# Patient Record
Sex: Male | Born: 1955
Health system: Southern US, Community
[De-identification: ages and names within clinical notes are randomized; demographics above are authoritative.]

## PROBLEM LIST (undated history)

## (undated) ENCOUNTER — Ambulatory Visit: Attending: Pharmacist | Primary: Pharmacist

## (undated) ENCOUNTER — Encounter

## (undated) ENCOUNTER — Ambulatory Visit

## (undated) ENCOUNTER — Telehealth

## (undated) ENCOUNTER — Encounter: Attending: Neurology | Primary: Neurology

## (undated) ENCOUNTER — Ambulatory Visit: Payer: MEDICARE | Attending: Family | Primary: Family

## (undated) ENCOUNTER — Telehealth
Attending: Pharmacist Clinician (PhC)/ Clinical Pharmacy Specialist | Primary: Pharmacist Clinician (PhC)/ Clinical Pharmacy Specialist

## (undated) ENCOUNTER — Ambulatory Visit
Attending: Pharmacist Clinician (PhC)/ Clinical Pharmacy Specialist | Primary: Pharmacist Clinician (PhC)/ Clinical Pharmacy Specialist

## (undated) ENCOUNTER — Encounter: Attending: Pharmacist | Primary: Pharmacist

## (undated) ENCOUNTER — Encounter
Attending: Pharmacist Clinician (PhC)/ Clinical Pharmacy Specialist | Primary: Pharmacist Clinician (PhC)/ Clinical Pharmacy Specialist

## (undated) ENCOUNTER — Ambulatory Visit: Payer: MEDICARE | Attending: Neurology | Primary: Neurology

## (undated) ENCOUNTER — Ambulatory Visit: Payer: BLUE CROSS/BLUE SHIELD

## (undated) ENCOUNTER — Telehealth: Attending: Neurology | Primary: Neurology

## (undated) DIAGNOSIS — R0600 Dyspnea, unspecified: Secondary | ICD-10-CM

## (undated) DIAGNOSIS — E039 Hypothyroidism, unspecified: Secondary | ICD-10-CM

## (undated) DIAGNOSIS — U071 COVID-19: Secondary | ICD-10-CM

## (undated) DIAGNOSIS — E7800 Pure hypercholesterolemia, unspecified: Secondary | ICD-10-CM

## (undated) DIAGNOSIS — Z9889 Other specified postprocedural states: Secondary | ICD-10-CM

## (undated) DIAGNOSIS — J189 Pneumonia, unspecified organism: Secondary | ICD-10-CM

## (undated) DIAGNOSIS — K759 Inflammatory liver disease, unspecified: Secondary | ICD-10-CM

## (undated) DIAGNOSIS — C801 Malignant (primary) neoplasm, unspecified: Secondary | ICD-10-CM

## (undated) DIAGNOSIS — N19 Unspecified kidney failure: Secondary | ICD-10-CM

## (undated) DIAGNOSIS — E119 Type 2 diabetes mellitus without complications: Secondary | ICD-10-CM

## (undated) DIAGNOSIS — G4730 Sleep apnea, unspecified: Secondary | ICD-10-CM

## (undated) DIAGNOSIS — G255 Other chorea: Secondary | ICD-10-CM

## (undated) DIAGNOSIS — R112 Nausea with vomiting, unspecified: Secondary | ICD-10-CM

## (undated) DIAGNOSIS — I10 Essential (primary) hypertension: Secondary | ICD-10-CM

## (undated) DIAGNOSIS — D649 Anemia, unspecified: Secondary | ICD-10-CM

## (undated) DIAGNOSIS — G709 Myoneural disorder, unspecified: Secondary | ICD-10-CM

## (undated) HISTORY — DX: Unspecified kidney failure: N19

## (undated) HISTORY — PX: CATARACT EXTRACTION: SUR2

## (undated) MED ORDER — SODIUM ZIRCONIUM CYCLOSILICATE 10 GRAM ORAL POWDER PACKET: 0 days

## (undated) MED ORDER — RISPERIDONE 0.25 MG TABLET: Freq: Two times a day (BID) | ORAL | 0 days

---
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
## 2008-04-23 HISTORY — PX: KIDNEY TRANSPLANT: SHX239

## 2014-01-21 HISTORY — PX: COLONOSCOPY: SHX174

## 2015-07-26 ENCOUNTER — Ambulatory Visit (HOSPITAL_COMMUNITY): Payer: BLUE CROSS/BLUE SHIELD | Attending: Internal Medicine | Admitting: Physical Therapy

## 2015-07-26 DIAGNOSIS — M79675 Pain in left toe(s): Secondary | ICD-10-CM | POA: Diagnosis present

## 2015-07-26 DIAGNOSIS — L89894 Pressure ulcer of other site, stage 4: Secondary | ICD-10-CM | POA: Diagnosis present

## 2015-07-26 DIAGNOSIS — R262 Difficulty in walking, not elsewhere classified: Secondary | ICD-10-CM | POA: Diagnosis present

## 2015-07-26 DIAGNOSIS — E08621 Diabetes mellitus due to underlying condition with foot ulcer: Secondary | ICD-10-CM | POA: Insufficient documentation

## 2015-07-26 DIAGNOSIS — L97503 Non-pressure chronic ulcer of other part of unspecified foot with necrosis of muscle: Secondary | ICD-10-CM | POA: Diagnosis present

## 2015-07-27 ENCOUNTER — Encounter (HOSPITAL_COMMUNITY): Payer: Self-pay

## 2015-07-27 ENCOUNTER — Ambulatory Visit (HOSPITAL_COMMUNITY): Payer: BLUE CROSS/BLUE SHIELD

## 2015-07-27 DIAGNOSIS — E08621 Diabetes mellitus due to underlying condition with foot ulcer: Secondary | ICD-10-CM | POA: Diagnosis not present

## 2015-07-27 DIAGNOSIS — M79675 Pain in left toe(s): Secondary | ICD-10-CM

## 2015-07-27 DIAGNOSIS — L97503 Non-pressure chronic ulcer of other part of unspecified foot with necrosis of muscle: Secondary | ICD-10-CM

## 2015-07-27 DIAGNOSIS — R262 Difficulty in walking, not elsewhere classified: Secondary | ICD-10-CM

## 2015-07-27 NOTE — Therapy (Addendum)
[place], [place], [address] Phone: [phone]   Fax:  [phone]  Wound Care Therapy  Patient Details  Name: Nathan Tyler MRN: [id number] Date of Birth: 07/24/1955 No Data Recorded  Encounter Date: 07/27/2015      PT End of Session - 07/27/15 1615    Visit Number 2   Number of Visits 16   Date for PT Re-Evaluation 08/25/15   Authorization Type BCBS   Authorization Time Period Certification dates [id number]   PT Start Time 1525   PT Stop Time 1600   PT Time Calculation (min) 35 min   Activity Tolerance Patient tolerated treatment well   Behavior During Therapy [facility] for tasks assessed/performed      History reviewed. No pertinent past medical history.  History reviewed. No pertinent past surgical history.  There were no vitals filed for this visit.  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  Pt stated he went to MD earlier today and was unable to have xray taken, missing referral.     Currently in Pain? No/denies                   Wound Therapy - 07/27/15 1605    Subjective Pt entered dept with dressings intact, no reports of pain today.  Pt stated he went to MD earlier today and was unable to have xray taken, missing referral.     Patient and Family Stated Goals for this wound to heal   Date of Onset 07/20/15   Prior Treatments self care   Pain Assessment No/denies pain   Pain Score 0-No pain   Pain Type Acute pain   Pain Location Toe (Comment which one)  2nd toe Lt foot   Pain Orientation Left   Pain Onset On-going   Patients Stated Pain Goal 0   Pain Intervention(s) Emotional support   Multiple  Pain Sites No   Evaluation and Treatment Procedures Explained to Patient/Family Yes   Evaluation and Treatment Procedures agreed to   Wound Properties Date First Assessed: 07/26/15 Time First Assessed: 1016 Wound Type: Diabetic ulcer Location: Toe (Comment  which one) Location Orientation: Left Wound Description (Comments): 2nd toe apex   Dressing Type Gauze (Comment)  silverhydrofiber, 2x2 and 2" kling    Dressing Changed Changed   Dressing Status Old drainage   Dressing Change Frequency Every 3 days   Site / Wound Assessment Dusky;Pink   % Wound base Red or Granulating 0%   % Wound base Yellow 100%   % Wound base Black 0%   Peri-wound Assessment Edema;Erythema (blanchable)   Drainage Amount Minimal   Drainage Description Purulent;Odor   Treatment Cleansed;Debridement (Selective);Hydrotherapy (Pulse lavage)   Wound Properties Date First Assessed: 07/26/15 Wound Type: Diabetic ulcer Location: Toe (Comment  which one) Location Orientation: Left Wound Description (Comments): plantar aspect  Present on Admission: Yes   Dressing Type Gauze (Comment)  silverhydrofiber, 2x2 and 2" kling    Dressing Changed Changed   Dressing Status Old drainage   Dressing Change Frequency Every 3 days   Site / Wound Assessment Pink   % Wound base Red or Granulating 75%   % Wound base Yellow  25%   Drainage Amount Minimal   Drainage Description Purulent   Treatment Cleansed;Debridement (Selective);Hydrotherapy (Pulse lavage)   Selective Debridement - Location wound bed   Selective Debridement - Tools Used Forceps;Scalpel   Selective Debridement - Tissue Removed slough, dead skin perimeter   Wound Therapy - Clinical Statement Began PLS for deep cleansing of diabetic ulcer on dorsal and plantar surface of 2nd toe.  Selective debridement for removal of slough and dry skin perimeter of wound to promote healing.  Continued with silverhydrofiber for drainage and to reduce purulent odor.  No reports of pain through  session.   Wound Therapy - Functional Problem List difficulty walking, uncomfortable to place a sock or a shoe on his foot.    Factors Delaying/Impairing Wound Healing Altered sensation;Diabetes Mellitus;Infection - systemic/local;Multiple medical problems;Polypharmacy   Hydrotherapy Plan Debridement;Dressing change;Patient/family education;Pulsatile lavage with suction   Wound Therapy - Frequency --  2x/week x 8 weeks   Wound Therapy - Current Recommendations PT   Wound Plan Pt to be seen for debridement, education and dressing.  Pt may benefit from pulse lavage for deep cleansing    Dressing  silverhydrofiber, 2x2 and 2" kling                    PT Short Term Goals - 07/27/15 0841    PT SHORT TERM GOAL #1   Title Pt Lt toe pain to be decreased to no more than a 2/10 to be able to tolerate having a sock on his foot   Time 4   Period Weeks   Status New   PT SHORT TERM GOAL #2   Title Pt wound to be 100% granulated to prevent local and systemic infection    Time 4   Period Weeks   Status New   PT SHORT TERM GOAL #3   Title Pt to be able to tolerate walking for 30 minutes at a time.   Time 4   Period Weeks   Status New   PT SHORT TERM GOAL #4   Title Pt to verbalize the importance of daily foot inspection to prevent diabetic ulcers   Time 2   Period Weeks   Status New           PT Long Term Goals - 07/27/15 [id number]    PT LONG TERM GOAL #1   Title Pt to have no pain in his left second toe to be able to tolerate wearing shoes and socks all day long.    Time 8   Period Weeks   Status New   PT LONG TERM GOAL #2   Title Pt wound to be 100% healed for reduced risk of celluliltis or other infections    Time 8   Period Weeks   Status New   PT LONG TERM GOAL #3   Title Pt to be able to walk for up to 2 hours without any increased pain in his left second toe    Baseline 8   Period Weeks   Status New                Problem List There are no active  problems to display for this patient.  [address], LPTA; [place]  Aldona Lento 07/27/2015, 4:48 PM  [place] [address] [place], [place], [address] Phone: [phone]   Fax:  [phone]  Name: Nathan Tyler MRN: [id number] Date of Birth: May 17, 1955

## 2015-07-27 NOTE — Addendum Note (Signed)
Addended by: Leeroy Cha on: 07/27/2015 04:49 PM   Modules accepted: Orders

## 2015-07-27 NOTE — Therapy (Addendum)
[place], [place], [address] Phone: [phone]   Fax:  [phone]  Wound Care Evaluation  Patient Details  Name: Nathan Tyler MRN: [id number] Date of Birth: 03/01/1956 No Data Recorded  Encounter Date: 07/26/2015      PT End of Session - 07/26/15 1615    Visit Number 1   Number of Visits 16   Date for PT Re-Evaluation 08/25/15   Authorization Type BCBS   Authorization Time Period Certification dates [id number]   PT Start Time 1015   PT Stop Time 1100   PT Time Calculation (min) 45 min   Activity Tolerance Patient tolerated treatment well   Behavior During Therapy [facility] for tasks assessed/performed      No past medical history on file.  No past surgical history on file.  There were no vitals filed for this visit.  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  He has tried self care and antibiotics but the wound has continued to progress therefore his MD has referred him to therapy.   Patient and Family Stated Goals for this wound to heal   Date of Onset 07/20/15   Prior Treatments self care   Pain Assessment 0-10   Pain Score 8    Pain Type Acute pain;Neuropathic pain   Pain Location Toe (Comment which one)  2nd toe on left   Pain Orientation Left   Pain Descriptors / Indicators Aching   Pain Onset On-going   Patients Stated Pain Goal 0   Pain Intervention(s) Emotional support   Multiple Pain Sites No   Evaluation and Treatment Procedures Explained to Patient/Family Yes   Evaluation and Treatment Procedures agreed to   Wound Properties Date First Assessed: 07/26/15 Time First Assessed: 1016 Wound Type: Diabetic ulcer Location: Toe (Comment  which one) Location Orientation: Left Wound Description (Comments): 2nd toe apex   Dressing Type Gauze (Comment)   Dressing Changed Changed   Dressing Status Old drainage   Dressing Change Frequency Every 3 days   Site / Wound Assessment Black;Dusky   % Wound base Red or Granulating 0%   % Wound base Yellow 80%   % Wound base Black 20%   Peri-wound Assessment Edema;Erythema (blanchable)  entire toe is red    Wound Length (cm) 0.4 cm   Wound Width (cm) 0.4 cm   Wound Depth (cm) 1.2 cm  at least    Undermining (cm) .8 entire periphery    Drainage Amount Minimal   Drainage Description Purulent;Odor   Treatment Cleansed;Debridement (Selective)   Wound Properties Date First Assessed: 07/26/15 Wound Type: Diabetic ulcer Location: Toe (Comment  which one) Location Orientation: Left Wound Description (Comments): plantar aspect  Present on Admission: Yes   Peri-wound Assessment Edema;Erythema (blanchable)   Wound Length (cm) 0.4 cm   Wound Width (cm) 0.4 cm   Undermining (cm) .6  at least around entire periphery.     Treatment Cleansed;Debridement (Selective)   Selective Debridement - Location wound bed   Selective Debridement -  Tools Used Forceps;Scalpel;Scissors   Selective Debridement - Tissue Removed slough    Wound Therapy - Clinical Statement Nathan Tyler has had two diabetic ullcer on the second toe on his left foot for two weeks now.  Upon examination he has two wound; one at the apex of his toe and the other on the plantar aspect.  Both wounds undermine and most likely connect below skin level.  Due to the severity of the wound and multiple medical complications Nathan Tyler will benefit from skilled physcial therapy to prevent systemic infection, improve his ability to ambulate, and decrease his pain.    Wound Therapy -  Functional Problem List difficulty walking, uncomfortable to place a sock or a shoe on his foot.    Factors Delaying/Impairing Wound Healing Altered sensation;Diabetes Mellitus;Infection - systemic/local;Multiple medical problems;Polypharmacy   Hydrotherapy Plan Debridement;Dressing change;Patient/family education;Pulsatile lavage with suction   Wound Therapy - Frequency --  twice a week for 8 weeks or until healed    Wound Therapy - Current Recommendations PT   Wound Plan Pt to be seen for debridement, education and dressing.  Pt may benefit from pulse lavage for deep cleansing    Dressing  silverhydrofiber, 2x2 and 2" kling                 PT Short Term Goals - 07/27/15 0841    PT SHORT TERM GOAL #1   Title Pt Lt toe pain to be decreased to no more than a 2/10 to be able to tolerate having a sock on his foot   Time 4   Period Weeks   Status New   PT SHORT TERM GOAL #2   Title Pt wound to be 100% granulated to prevent local and systemic infection    Time 4   Period Weeks   Status New   PT SHORT TERM GOAL #3   Title Pt to be able to tolerate walking for 30 minutes at a time.   Time 4   Period Weeks   Status New   PT SHORT TERM GOAL #4   Title Pt to verbalize the importance of daily foot inspection to prevent diabetic ulcers   Time 2   Period Weeks   Status New           PT Long Term Goals - 07/27/15 [id number]    PT LONG TERM GOAL #1   Title Pt to have no pain in his left second toe to be able to tolerate wearing shoes and socks all day long.    Time 8   Period Weeks   Status New   PT LONG TERM GOAL #2   Title Pt wound to be 100% healed for reduced risk of celluliltis or other infections    Time 8   Period Weeks   Status New   PT LONG TERM GOAL #3   Title Pt to be able to walk for up to 2 hours without any increased pain in his left second toe    Baseline 8   Period Weeks   Status New              Problem List There are no active problems to display  for this patient.   Rayetta Humphrey, PT CLT [phone] 07/27/2015, 4:41 PM  [place] [address] [place], [place], [address] Phone: [phone]   Fax:  [phone]  Name: Nathan Tyler MRN: [id number] Date of Birth: Jan 12, 1956

## 2015-08-02 ENCOUNTER — Ambulatory Visit (HOSPITAL_COMMUNITY)
Admission: RE | Admit: 2015-08-02 | Discharge: 2015-08-02 | Disposition: A | Discharge status: Home / Self Care | Payer: BLUE CROSS/BLUE SHIELD | Source: Ambulatory Visit | Attending: Internal Medicine | Admitting: Internal Medicine

## 2015-08-02 ENCOUNTER — Ambulatory Visit (HOSPITAL_COMMUNITY): Payer: BLUE CROSS/BLUE SHIELD | Admitting: Physical Therapy

## 2015-08-02 ENCOUNTER — Other Ambulatory Visit (HOSPITAL_COMMUNITY): Payer: Self-pay | Admitting: Internal Medicine

## 2015-08-02 DIAGNOSIS — R2241 Localized swelling, mass and lump, right lower limb: Secondary | ICD-10-CM | POA: Insufficient documentation

## 2015-08-02 DIAGNOSIS — L89894 Pressure ulcer of other site, stage 4: Secondary | ICD-10-CM

## 2015-08-02 DIAGNOSIS — M7989 Other specified soft tissue disorders: Secondary | ICD-10-CM

## 2015-08-02 DIAGNOSIS — E08621 Diabetes mellitus due to underlying condition with foot ulcer: Secondary | ICD-10-CM | POA: Diagnosis not present

## 2015-08-02 DIAGNOSIS — M79672 Pain in left foot: Secondary | ICD-10-CM | POA: Insufficient documentation

## 2015-08-02 DIAGNOSIS — M25472 Effusion, left ankle: Secondary | ICD-10-CM

## 2015-08-02 DIAGNOSIS — R262 Difficulty in walking, not elsewhere classified: Secondary | ICD-10-CM

## 2015-08-02 DIAGNOSIS — M79675 Pain in left toe(s): Secondary | ICD-10-CM

## 2015-08-02 NOTE — Therapy (Signed)
[place], [place], [address] Phone: [phone]   Fax:  [phone]  Wound Care Therapy  Patient Details  Name: Nathan Tyler MRN: [id number] Date of Birth: [birth year] No Data Recorded  Encounter Date: 08/02/2015      PT End of Session - 08/02/15 1620    Visit Number 3   Number of Visits 16   Date for PT Re-Evaluation 08/25/15   Authorization Type BCBS   Authorization Time Period Certification dates 05/29/35-0/48/8891   Authorization - Visit Number 2   Authorization - Number of Visits 10   PT Start Time 1030   PT Stop Time 1120   PT Time Calculation (min) 50 min   Activity Tolerance Patient tolerated treatment well   Behavior During Therapy [facility] for tasks assessed/performed      No past medical history on file.  No past surgical history on file.  There were no vitals filed for this visit.                  Wound Therapy - 08/02/15 1611    Subjective Nathan Tyler comes with his bandage intact but pulling his second toe into extension.     Patient and Family Stated Goals for this wound to heal   Date of Onset 07/20/15   Prior Treatments self care   Pain Assessment 0-10   Pain Score 1    Pain Type Acute pain   Pain Location Toe (Comment which one)  2nd   Pain Orientation Left   Pain Onset With Activity  was at a 0 this morning    Pain Intervention(s) Emotional support   Multiple Pain Sites No   Evaluation and Treatment Procedures Explained to Patient/Family Yes   Evaluation and Treatment Procedures agreed to   Wound Properties Date First Assessed: 07/26/15 Time First Assessed: 1016 Wound Type: Diabetic ulcer Location: Toe (Comment  which one) Location Orientation: Left Wound Description (Comments): 2nd toe apex   Dressing Type Gauze (Comment)  silverhydrofiber, 2x2 and 2" kling    Dressing Changed Changed   Dressing Status Old drainage   Dressing Change Frequency Every 3 days   Site /  Wound Assessment Dusky;Pink   % Wound base Red or Granulating 0%   % Wound base Yellow 100%   % Wound base Black 0%   Peri-wound Assessment Edema;Erythema (blanchable)   Wound Length (cm) 0.5 cm   Wound Width (cm) 0.6 cm   Wound Depth (cm) 1.2 cm  at least   Drainage Amount Minimal   Drainage Description Purulent;Odor   Treatment Cleansed;Debridement (Selective);Hydrotherapy (Pulse lavage)   Wound Properties Date First Assessed: 07/26/15 Wound Type: Diabetic ulcer Location: Toe (Comment  which one) Location Orientation: Left Wound Description (Comments): plantar aspect  Present on Admission: Yes   Dressing Type Gauze (Comment)  silverhydrofiber, 2x2 and 2" kling    Dressing Changed Changed   Dressing Status Old drainage   Dressing Change Frequency Every 3 days   Site / Wound Assessment Pink   % Wound base Red or Granulating 75%   % Wound base Yellow 25%   Peri-wound Assessment Edema   Wound Length (cm) 0.4 cm   Wound Width (cm) 0.4 cm   Undermining (cm) .6   most likely connects to first wound    Drainage Amount Minimal   Drainage Description Purulent   Treatment Cleansed;Debridement (Selective)   Selective Debridement - Location wound bed   Selective Debridement - Tools Used  Forceps;Scalpel   Selective Debridement - Tissue Removed slough, dead skin perimeter   Wound Therapy - Clinical Statement Nathan Tyler has not had an x-ray on his toe.  Therapist called MD requesting an x-ray.  Report returned probable osteomyelitis.  Therapist called MD re how he would like [place] to proceed or if he would want to refer pt to a surgeon.     Wound Therapy - Functional Problem List difficulty walking, uncomfortable to place a sock or a shoe on his foot.    Factors Delaying/Impairing Wound Healing Altered sensation;Diabetes Mellitus;Infection - systemic/local;Multiple medical problems;Polypharmacy   Hydrotherapy Plan Debridement;Dressing change;Patient/family education;Pulsatile lavage with suction    Wound Therapy - Frequency --  2x/week x 8 weeks   Wound Therapy - Current Recommendations PT   Wound Plan See if pt has been referred to a surgeon    Dressing  silverhydrofiber, 2x2 and 2" kling                  PT Education - 08/02/15 1619    Education provided Yes   Education Details importance of looking at dressing every day to make sure that it does not need to be changed.  Reasons it wound need to be changed:  extensive drainage, bandage unraveling, bandage pulling on appendage in an unnatural position.    Person(s) Educated Patient   Methods Explanation   Comprehension Verbalized understanding          PT Short Term Goals - 08/02/15 1622    PT SHORT TERM GOAL #1   Title Pt Lt toe pain to be decreased to no more than a 2/10 to be able to tolerate having a sock on his foot   Time 4   Period Weeks   Status Achieved   PT SHORT TERM GOAL #2   Title Pt wound to be 100% granulated to prevent local and systemic infection    Time 4   Period Weeks   Status Not Met   PT SHORT TERM GOAL #3   Title Pt to be able to tolerate walking for 30 minutes at a time.   Time 4   Period Weeks   Status Not Met   PT SHORT TERM GOAL #4   Title Pt to verbalize the importance of daily foot inspection to prevent diabetic ulcers   Time 2   Period Weeks   Status On-going           PT Long Term Goals - 08/02/15 1622    PT LONG TERM GOAL #1   Title Pt to have no pain in his left second toe to be able to tolerate wearing shoes and socks all day long.    Time 8   Period Weeks   Status On-going   PT LONG TERM GOAL #2   Title Pt wound to be 100% healed for reduced risk of celluliltis or other infections    Time 8   Period Weeks   Status Not Met   PT LONG TERM GOAL #3   Title Pt to be able to walk for up to 2 hours without any increased pain in his left second toe    Baseline 8   Period Weeks   Status On-going             Patient will benefit from skilled therapeutic  intervention in order to improve the following deficits and impairments:     Visit Diagnosis: Difficulty in walking, not elsewhere classified  Pain in left  toe(s)  Decubitus ulcer of foot, stage 4 ([place])     Problem List There are no active problems to display for this patient.   Nathan Tyler, PT CLT [phone] 08/02/2015, 4:35 PM  [place] [address] [place], [place], [address] Phone: [phone]   Fax:  [phone]  Name: Juelz Whittenberg MRN: [id number] Date of Birth: [birth year]

## 2015-08-04 ENCOUNTER — Encounter: Payer: Self-pay | Admitting: Orthopaedic Surgery

## 2015-08-04 ENCOUNTER — Other Ambulatory Visit: Payer: Self-pay

## 2015-08-04 ENCOUNTER — Ambulatory Visit: Payer: BLUE CROSS/BLUE SHIELD | Admitting: Orthopaedic Surgery

## 2015-08-04 ENCOUNTER — Ambulatory Visit (HOSPITAL_COMMUNITY): Payer: BLUE CROSS/BLUE SHIELD

## 2015-08-04 ENCOUNTER — Ambulatory Visit (HOSPITAL_COMMUNITY)
Admission: RE | Admit: 2015-08-04 | Discharge: 2015-08-04 | Disposition: A | Discharge status: Home / Self Care | Payer: BLUE CROSS/BLUE SHIELD | Source: Ambulatory Visit | Attending: Neurology | Admitting: Neurology

## 2015-08-04 ENCOUNTER — Ambulatory Visit (INDEPENDENT_AMBULATORY_CARE_PROVIDER_SITE_OTHER): Payer: BLUE CROSS/BLUE SHIELD | Admitting: Orthopaedic Surgery

## 2015-08-04 VITALS — BP 132/86 | HR 84 | Temp 97.9°F | Resp 16 | Ht 71.0 in | Wt 195.0 lb

## 2015-08-04 DIAGNOSIS — E08621 Diabetes mellitus due to underlying condition with foot ulcer: Secondary | ICD-10-CM | POA: Diagnosis not present

## 2015-08-04 DIAGNOSIS — E1051 Type 1 diabetes mellitus with diabetic peripheral angiopathy without gangrene: Secondary | ICD-10-CM | POA: Diagnosis not present

## 2015-08-04 DIAGNOSIS — M869 Osteomyelitis, unspecified: Secondary | ICD-10-CM | POA: Diagnosis not present

## 2015-08-04 DIAGNOSIS — I10 Essential (primary) hypertension: Secondary | ICD-10-CM | POA: Diagnosis not present

## 2015-08-04 DIAGNOSIS — L89894 Pressure ulcer of other site, stage 4: Secondary | ICD-10-CM

## 2015-08-04 DIAGNOSIS — M79675 Pain in left toe(s): Secondary | ICD-10-CM

## 2015-08-04 DIAGNOSIS — L97503 Non-pressure chronic ulcer of other part of unspecified foot with necrosis of muscle: Secondary | ICD-10-CM

## 2015-08-04 DIAGNOSIS — R262 Difficulty in walking, not elsewhere classified: Secondary | ICD-10-CM

## 2015-08-04 MED ORDER — DOXYCYCLINE HYCLATE 50 MG PO CAPS: ORAL_CAPSULE | ORAL | Status: DC

## 2015-08-04 NOTE — Therapy (Signed)
[name], [place], [address] Phone: [phone]   Fax:  [phone]  Wound Care Therapy  Patient Details  Name: Nathan Tyler MRN: [id number] Date of Birth: [birth year] No Data Recorded  Encounter Date: 08/04/2015      PT End of Session - 08/04/15 1722    Visit Number 4   Number of Visits 16   Date for PT Re-Evaluation 08/25/15   Authorization Type BCBS   Authorization Time Period Certification dates 07/22/9620-2/97/9892   PT Start Time 1034   PT Stop Time 1121   PT Time Calculation (min) 47 min   Activity Tolerance Patient tolerated treatment well   Behavior During Therapy [facility] for tasks assessed/performed      No past medical history on file.  No past surgical history on file.  There were no vitals filed for this visit.       Subjective Assessment - 08/04/15 1718    Subjective Pt entered dept with dressing intact, reports girlfriend changed dressings,  Apt scheduled at MD later today to discuss findings from xray.   Currently in Pain? Yes   Pain Score 3    Pain Location Toe (Comment which one)  2nd toe   Pain Orientation Left   Pain Descriptors / Indicators Aching   Pain Type Acute pain                   Wound Therapy - 08/04/15 1718    Subjective Pt entered dept with dressing intact, reports girlfriend changed dressings,  Apt scheduled at MD later today to discuss findings from xray.   Patient and Family Stated Goals for this wound to heal   Date of Onset 07/20/15   Prior Treatments self care   Pain Onset With Activity   Pain Intervention(s) Emotional support   Multiple Pain Sites No   Evaluation and Treatment Procedures Explained to Patient/Family Yes   Evaluation and Treatment Procedures agreed to   Wound Properties Date First Assessed: 07/26/15 Time First Assessed: 1016 Wound Type: Diabetic ulcer Location: Toe (Comment  which one) Location Orientation: Left Wound Description  (Comments): 2nd toe apex   Dressing Type Silver hydrofiber;Gauze (Comment)   Dressing Changed Changed   Dressing Status Old drainage   Dressing Change Frequency Every 3 days   Site / Wound Assessment Dusky;Pink   % Wound base Red or Granulating 0%   % Wound base Yellow 100%   % Wound base Black 0%   Drainage Amount Minimal   Drainage Description Purulent;Odor   Treatment Cleansed;Debridement (Selective);Hydrotherapy (Pulse lavage)   Wound Properties Date First Assessed: 07/26/15 Wound Type: Diabetic ulcer Location: Toe (Comment  which one) Location Orientation: Left Wound Description (Comments): plantar aspect  Present on Admission: Yes   Dressing Type Silver hydrofiber;Gauze (Comment)   Dressing Changed Changed   Dressing Status Old drainage   Dressing Change Frequency Every 3 days   Site / Wound Assessment Pink   % Wound base Red or Granulating 75%   % Wound base Yellow 25%   Drainage Amount Minimal   Drainage Description Purulent   Treatment Cleansed;Debridement (Selective);Hydrotherapy (Pulse lavage)   Selective Debridement - Location wound bed   Selective Debridement - Tools Used Forceps;Scalpel   Selective Debridement - Tissue Removed slough, dead skin perimeter   Wound Therapy - Clinical Statement Significant amount of dead and dry skin perimeter of wound.  Selective debridement for removal of dead skin and slough in wound.  Unknown  depth.  PLS complete for cleansing wound.  Continued with silver hydrofiber to reduce drainage.  Pt with apt with MD later today, follow up with MD for continuing wound care here vs. referral for surgeon next session.    Wound Therapy - Functional Problem List difficulty walking, uncomfortable to place a sock or a shoe on his foot.    Factors Delaying/Impairing Wound Healing Altered sensation;Diabetes Mellitus;Infection - systemic/local;Multiple medical problems;Polypharmacy   Hydrotherapy Plan Debridement;Dressing change;Patient/family  education;Pulsatile lavage with suction   Wound Therapy - Frequency --  2x/week x 8 weeks   Wound Therapy - Current Recommendations PT   Wound Plan See if pt has been referred to a surgeon    Dressing  silverhydrofiber, 2x2 and 2" kling                    PT Short Term Goals - 08/02/15 1622    PT SHORT TERM GOAL #1   Title Pt Lt toe pain to be decreased to no more than a 2/10 to be able to tolerate having a sock on his foot   Time 4   Period Weeks   Status Achieved   PT SHORT TERM GOAL #2   Title Pt wound to be 100% granulated to prevent local and systemic infection    Time 4   Period Weeks   Status Not Met   PT SHORT TERM GOAL #3   Title Pt to be able to tolerate walking for 30 minutes at a time.   Time 4   Period Weeks   Status Not Met   PT SHORT TERM GOAL #4   Title Pt to verbalize the importance of daily foot inspection to prevent diabetic ulcers   Time 2   Period Weeks   Status On-going           PT Long Term Goals - 08/02/15 1622    PT LONG TERM GOAL #1   Title Pt to have no pain in his left second toe to be able to tolerate wearing shoes and socks all day long.    Time 8   Period Weeks   Status On-going   PT LONG TERM GOAL #2   Title Pt wound to be 100% healed for reduced risk of celluliltis or other infections    Time 8   Period Weeks   Status Not Met   PT LONG TERM GOAL #3   Title Pt to be able to walk for up to 2 hours without any increased pain in his left second toe    Baseline 8   Period Weeks   Status On-going             Patient will benefit from skilled therapeutic intervention in order to improve the following deficits and impairments:     Visit Diagnosis: Difficulty in walking, not elsewhere classified  Pain in left toe(s)  Decubitus ulcer of foot, stage 4 (HCC)  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  [address], LPTA; [place]  Aldona Lento 08/04/2015, 6:44 PM  [facility] [address] [place], [place], [address] Phone: [phone]   Fax:  [phone]  Name: Gerhart Ruggieri MRN: [id number] Date of Birth: 1955-06-20

## 2015-08-04 NOTE — Patient Instructions (Signed)
Go to ER this weekend if worse

## 2015-08-05 ENCOUNTER — Encounter: Payer: Self-pay | Admitting: Orthopaedic Surgery

## 2015-08-05 NOTE — Progress Notes (Signed)
Subjective: my toe is infected    Patient ID: Nathan Tyler, male    DOB: 1956/03/29, 60 y.o.   MRN: [id number]  HPI  He has had swelling and redness of the left second toe for about a month.  He has had little pain.  He has been followed by Dr. Merlyn Albert and also physical therapy.  He has had x-rays recently which showed: IMPRESSION: Soft tissue swelling of the second toe with probable osteomyelitis of the second middle and distal phalanx  X-rays were made of his left foot 08-02-15.  After getting the report, he was referred here.  He has long term diabetes Type 1 on insulin.  His A1C was 9.6 but has risen to around 11 recently. His blood sugar was 175 this morning.  He has had more swings in his sugars with the infection. He is on oral medicine.  He does not remember any trauma to the toe or an insect bite or change in shoe wear.  It began swelling and turned red.  It is better now he says.  He has no pain.  He has no recent discharge.    I reviewed his x-rays and the report.  I reviewed his PT notes.  I reviewed information from Dr. Nevada Crane.  I have told the patient hs has osteomyelitis of the second toe. He could lose his toe.  It could get worse and he develop circulatory changes of the foot that might also require amputation.  I have told him to examine his toe three times a day, morning, mid day and evening, every day, without fail.  I will change to doxycyline 100 bid dosages beginning today.  He is to stop his other oral antibiotic.  If his foot develops redness, not just the toe, then he must seek immediate help.  If he notices this in his evening check, he is to go to ER and not wait until morning.  I do not take call any more.  The office will be closed tomorrow.  If he has any problem, go to the ER.  He understands.  I will see him Tuesday.  He has no drainage to get a culture.  He is to wrap his toe as he has been doing and go to PT.  He appears to understand and agrees to  what I have said.  He has hypertension also and that is well controlled.  He does not smoke and that is very good.  Again, he understands the seriousness of his problem  Review of Systems  HENT: Negative for congestion.   Respiratory: Negative for cough and shortness of breath.   Cardiovascular: Negative for chest pain and leg swelling.  Endocrine: Negative for cold intolerance.  Musculoskeletal: Positive for joint swelling and gait problem.  Allergic/Immunologic: Positive for environmental allergies.   History reviewed. No pertinent past medical history. History reviewed. No pertinent past surgical history.    Social History   Social History  . Marital Status: Unknown    Spouse Name: N/A  . Number of Children: N/A  . Years of Education: N/A   Occupational History  . Not on file.   Social History Main Topics  . Smoking status: Never Smoker   . Smokeless tobacco: Not on file  . Alcohol Use: Not on file  . Drug Use: Not on file  . Sexual Activity: Not on file   Other Topics Concern  . Not on file   Social History Narrative  BP 132/86  mmHg  Pulse 84  Temp(Src) 97.9 F (36.6 C)  Resp 16  Ht 5\' 11"  (1.803 m)  Wt 195 lb (88.451 kg)  BMI 27.21 kg/m2   Objective:   Physical Exam  Constitutional: He is oriented to person, place, and time. He appears well-developed and well-nourished.  HENT:  Head: Normocephalic and atraumatic.  Eyes: Conjunctivae and EOM are normal. Pupils are equal, round, and reactive to light.  Neck: Normal range of motion. Neck supple.  Cardiovascular: Normal rate, regular rhythm and intact distal pulses.   Pulmonary/Chest: Effort normal.  Abdominal: Soft.  Musculoskeletal: Tenderness: There is no pain of the left second toe but it is swollen, slightly red, no discharge, decreased sensaiton of the left foot.  No ulceration is present.  Pulses are weak but present.       Feet:  Neurological: He is alert and oriented to person, place, and time.  He has normal reflexes. No cranial nerve deficit. He exhibits normal muscle tone. Coordination normal.  Skin: Skin is warm and dry.  Psychiatric: He has a normal mood and affect. His behavior is normal. Judgment and thought content normal.      Assessment & Plan:   Encounter Diagnoses  Name Primary?  . Osteomyelitis of toe of left foot ([place]) Yes  . Type 1 diabetes mellitus with peripheral circulatory complications ([place])   . Essential hypertension, benign    Changed to doxycycline po.  Continue PT and dressing.  Check foot and toe three times a day, every day.  Go to ER if any problem or any change or anything that does not seem correct.  Return Tuesday for re-evaluation.

## 2015-08-09 ENCOUNTER — Ambulatory Visit (HOSPITAL_COMMUNITY): Payer: BLUE CROSS/BLUE SHIELD | Admitting: Physical Therapy

## 2015-08-09 ENCOUNTER — Ambulatory Visit: Payer: BLUE CROSS/BLUE SHIELD | Admitting: Orthopaedic Surgery

## 2015-08-09 DIAGNOSIS — R262 Difficulty in walking, not elsewhere classified: Secondary | ICD-10-CM

## 2015-08-09 DIAGNOSIS — E08621 Diabetes mellitus due to underlying condition with foot ulcer: Secondary | ICD-10-CM

## 2015-08-09 DIAGNOSIS — L89894 Pressure ulcer of other site, stage 4: Secondary | ICD-10-CM

## 2015-08-09 DIAGNOSIS — M79675 Pain in left toe(s): Secondary | ICD-10-CM

## 2015-08-09 DIAGNOSIS — L97503 Non-pressure chronic ulcer of other part of unspecified foot with necrosis of muscle: Secondary | ICD-10-CM

## 2015-08-09 NOTE — Therapy (Signed)
[place], [place], [address] Phone: [phone]   Fax:  [phone]  Wound Care Therapy  Patient Details  Name: Nathan Tyler MRN: [id number] Date of Birth: 06-01-1955 No Data Recorded  Encounter Date: 08/09/2015      PT End of Session - 08/09/15 1211    Visit Number 5   Number of Visits 16   Authorization Type BCBS   Authorization Time Period Certification dates 06/23/8248-5/39/7673   Authorization - Visit Number 5   Authorization - Number of Visits 10   PT Start Time 4193   PT Stop Time 1107   PT Time Calculation (min) 32 min   Activity Tolerance Patient tolerated treatment well      No past medical history on file.  No past surgical history on file.  There were no vitals filed for this visit.                  Wound Therapy - 08/09/15 1201    Subjective Pt entered dept with dressing intact, reports girlfriend changed dressings,  Apt scheduled at MD later today to discuss findings from xray.   Patient and Family Stated Goals for this wound to heal   Date of Onset 07/20/15   Prior Treatments self care   Pain Assessment 0-10   Pain Score 2    Pain Type Acute pain   Pain Location Toe (Comment which one)   Pain Orientation Left   Pain Descriptors / Indicators Aching;Burning   Pain Onset On-going   Pain Intervention(s) Emotional support   Multiple Pain Sites No   Evaluation and Treatment Procedures Explained to Patient/Family Yes   Evaluation and Treatment Procedures agreed to   Wound Properties Date First Assessed: 07/26/15 Time First Assessed: 1016 Wound Type: Diabetic ulcer Location: Toe (Comment  which one) Location Orientation: Left Wound Description (Comments): 2nd toe apex   Dressing Type Gauze (Comment);Impregnated gauze (petrolatum);Silver hydrofiber  Pt had toe cap and gauze toe macerated changed to silverhydr   Dressing Changed Changed   Dressing Status Old drainage   Dressing  Change Frequency Every 3 days   Site / Wound Assessment Friable   % Wound base Red or Granulating 0%   % Wound base Yellow 100%   % Wound base Black 0%   Peri-wound Assessment Edema;Maceration  due to maceration pt has lost skin on entire toe with wound    Drainage Amount Copious   Drainage Description Serosanguineous;Purulent   Treatment Cleansed;Debridement (Selective);Hydrotherapy (Pulse lavage)   Wound Properties Date First Assessed: 07/26/15 Wound Type: Diabetic ulcer Location: Toe (Comment  which one) Location Orientation: Left Wound Description (Comments): plantar aspect  Present on Admission: Yes   Dressing Type Gauze (Comment);Impregnated gauze (petrolatum);Silver hydrofiber   Dressing Changed Changed   Dressing Status Old drainage   Dressing Change Frequency Every 3 days   Site / Wound Assessment Pink   % Wound base Red or Granulating 75%   % Wound base Yellow 25%   Drainage Amount Minimal   Drainage Description Purulent   Treatment Cleansed;Debridement (Selective)   Selective Debridement - Location wound bed   Selective Debridement - Tools Used Forceps;Scalpel   Selective Debridement - Tissue Removed slough, dead skin perimeter   Wound Therapy - Clinical Statement Pt states MD wanted pt to put a toe cap on his toe every day.  When dressing removed toe is macerated with all skin sloughing off with debridement.  Therapist advised against using the  toe cap.  Pt states he is to see the surgeon tomorrow to assess progression of wound.     Wound Therapy - Functional Problem List difficulty walking, uncomfortable to place a sock or a shoe on his foot.    Factors Delaying/Impairing Wound Healing Altered sensation;Diabetes Mellitus;Infection - systemic/local;Multiple medical problems;Polypharmacy   Hydrotherapy Plan Debridement;Dressing change;Patient/family education;Pulsatile lavage with suction   Wound Therapy - Frequency --  2x/week x 8 weeks   Wound Therapy - Current  Recommendations PT   Wound Plan --   Dressing  silverhydrofiber to wound with xeroform placed around toe  2x2 and 2" kling                  PT Education - 08/09/15 1211    Education provided Yes   Education Details not to allow toe to become macerated.    Person(s) Educated Patient   Methods Explanation   Comprehension Verbalized understanding          PT Short Term Goals - 08/02/15 1622    PT SHORT TERM GOAL #1   Title Pt Lt toe pain to be decreased to no more than a 2/10 to be able to tolerate having a sock on his foot   Time 4   Period Weeks   Status Achieved   PT SHORT TERM GOAL #2   Title Pt wound to be 100% granulated to prevent local and systemic infection    Time 4   Period Weeks   Status Not Met   PT SHORT TERM GOAL #3   Title Pt to be able to tolerate walking for 30 minutes at a time.   Time 4   Period Weeks   Status Not Met   PT SHORT TERM GOAL #4   Title Pt to verbalize the importance of daily foot inspection to prevent diabetic ulcers   Time 2   Period Weeks   Status On-going           PT Long Term Goals - 08/02/15 1622    PT LONG TERM GOAL #1   Title Pt to have no pain in his left second toe to be able to tolerate wearing shoes and socks all day long.    Time 8   Period Weeks   Status On-going   PT LONG TERM GOAL #2   Title Pt wound to be 100% healed for reduced risk of celluliltis or other infections    Time 8   Period Weeks   Status Not Met   PT LONG TERM GOAL #3   Title Pt to be able to walk for up to 2 hours without any increased pain in his left second toe    Baseline 8   Period Weeks   Status On-going             Patient will benefit from skilled therapeutic intervention in order to improve the following deficits and impairments:     Visit Diagnosis: Difficulty in walking, not elsewhere classified  Pain in left toe(s)  Decubitus ulcer of foot, stage 4 (HCC)  Diabetic ulcer of toe associated with diabetes  mellitus due to underlying condition, with necrosis of muscle (HCC)  Toe pain, left     Problem List There are no active problems to display for this patient.   Rayetta Humphrey, PT CLT [phone] 08/09/2015, 12:13 PM  [facility] [address] [place], [place], [address] Phone: [phone]   Fax:  [phone]  Name: Nathan Tyler MRN: [id number] Date of Birth: 09/17/1955

## 2015-08-10 ENCOUNTER — Encounter: Payer: Self-pay | Admitting: Orthopaedic Surgery

## 2015-08-10 ENCOUNTER — Ambulatory Visit (INDEPENDENT_AMBULATORY_CARE_PROVIDER_SITE_OTHER): Payer: BLUE CROSS/BLUE SHIELD | Admitting: Orthopaedic Surgery

## 2015-08-10 VITALS — BP 135/79 | HR 90 | Temp 97.9°F | Resp 16 | Ht 71.0 in | Wt 195.0 lb

## 2015-08-10 DIAGNOSIS — E1051 Type 1 diabetes mellitus with diabetic peripheral angiopathy without gangrene: Secondary | ICD-10-CM

## 2015-08-10 DIAGNOSIS — I10 Essential (primary) hypertension: Secondary | ICD-10-CM

## 2015-08-10 DIAGNOSIS — M869 Osteomyelitis, unspecified: Secondary | ICD-10-CM

## 2015-08-10 NOTE — Progress Notes (Signed)
Patient MR:[id number] Nathan Tyler, male DOB:[birth year], 60 y.o. [id number]  Chief Complaint  Patient presents with  . Follow-up    LEFT SECOND TOE INFECTION    HPI  Nathan Tyler is a 60 y.o. male who has osteomyelitis of the left second toe.  He has been to PT.  I have had him on doxycycline for the last week.  He has had less redness of the second toe.  There is a concern that his infection and cellulitis of the toe will spread.  He may need amputation of the second toe.  I have told him that there is no cure for the osteomyelitis of the second toe middle phalanx.  It is already destroyed.  I want to try to keep the cellulitis from spreading and to control the infection of the toe area.  He may still need amputation of the second toe which, in and of itself, will resolve the osteomyelitis.  His diabetes blood sugars has improved.  He is running now in the 120 range. He is testing his blood sugars regularly now.  He has also been checking the appearance of the toe every morning, mid day and and every evening.  I want him to continue the antibiotic and to call here or go to the ER if after hours if any change, any concern, any difficulty.  He says he will do this.  HPI  Body mass index is 27.21 kg/(m^2).   Review of Systems  HENT: Negative for congestion.   Respiratory: Negative for cough and shortness of breath.   Cardiovascular: Negative for chest pain and leg swelling.  Endocrine: Negative for cold intolerance.  Musculoskeletal: Positive for joint swelling and gait problem.  Allergic/Immunologic: Positive for environmental allergies.    History reviewed. No pertinent past medical history.  History reviewed. No pertinent past surgical history.  History reviewed. No pertinent family history.  Social History Social History  Substance Use Topics  . Smoking status: Never Smoker   . Smokeless tobacco: None  . Alcohol Use: None    No Known Allergies  Current Outpatient Prescriptions   Medication Sig Dispense Refill  . doxycycline (VIBRAMYCIN) 50 MG capsule Take two pills twice a day 40 capsule 2  . Dulaglutide (TRULICITY [place]) Inject into the skin.    Marland Kitchen levothyroxine (SYNTHROID, LEVOTHROID) 112 MCG tablet TK 1 T PO QD  2  . lisinopril (PRINIVIL,ZESTRIL) 10 MG tablet TK 1 T PO  QD  5  . mycophenolate (CELLCEPT) 500 MG tablet TK 1 T PO BID  6  . predniSONE (DELTASONE) 5 MG tablet TK 1 T PO D  6  . tacrolimus (PROGRAF) 0.5 MG capsule TK 4 CS PO BID  6  . tetrabenazine (XENAZINE) 12.5 MG tablet   1   No current facility-administered medications for this visit.     Physical Exam  Blood pressure 135/79, pulse 90, temperature 97.9 F (36.6 C), resp. rate 16, height 5\' 11"  (1.803 m), weight 195 lb (88.451 kg).  Constitutional: overall normal hygiene, normal nutrition, well developed, normal grooming, normal body habitus. Assistive device:none  Musculoskeletal: gait and station Limp left, muscle tone and strength are normal, no tremors or atrophy is present.  .  Neurological: coordination overall normal.  Deep tendon reflex/nerve stretch intact.  Sensation normal.  Cranial nerves II-XII intact.   Skin:   Normal except the second toe which is swollen and has some redness overall no scars, lesions, ulcers or rashes. No psoriasis.  Psychiatric: Alert and oriented x 3.  Recent memory intact, remote memory unclear.  Normal mood and affect. Well groomed.  Good eye contact.  Cardiovascular: overall no swelling, no varicosities, no edema bilaterally, normal temperatures of the legs and arms, no clubbing, cyanosis and good capillary refill.  Lymphatic: palpation is normal.  The left second toe has slight erythema and no discharge.  It is enlarged. He has less swelling and less redness than last time last week.  He has decreased sensation of the foot and toe secondary to his diabetes and neuropathy.  The redness that had been at the base of the toe is gone  The patient has been  educated about the nature of the problem(s) and counseled on treatment options.  The patient appeared to understand what I have discussed and is in agreement with it.  Encounter Diagnoses  Name Primary?  . Osteomyelitis of toe of left foot ([place]) Yes  . Type 1 diabetes mellitus with peripheral circulatory complications ([place])   . Essential hypertension, benign     PLAN Call if any problems.  Precautions discussed.  Continue current medications.   Return to clinic 1 week   Continue antibiotic, call if any problem.

## 2015-08-10 NOTE — Patient Instructions (Addendum)
continiue to look at your foot and toe with a mirror if anything looks different call our office or go to ED Continue antibiotics

## 2015-08-11 ENCOUNTER — Ambulatory Visit (HOSPITAL_COMMUNITY): Payer: BLUE CROSS/BLUE SHIELD | Admitting: Physical Therapy

## 2015-08-11 DIAGNOSIS — R262 Difficulty in walking, not elsewhere classified: Secondary | ICD-10-CM

## 2015-08-11 DIAGNOSIS — E08621 Diabetes mellitus due to underlying condition with foot ulcer: Secondary | ICD-10-CM

## 2015-08-11 DIAGNOSIS — L89894 Pressure ulcer of other site, stage 4: Secondary | ICD-10-CM

## 2015-08-11 DIAGNOSIS — M79675 Pain in left toe(s): Secondary | ICD-10-CM

## 2015-08-11 DIAGNOSIS — L97503 Non-pressure chronic ulcer of other part of unspecified foot with necrosis of muscle: Secondary | ICD-10-CM

## 2015-08-11 NOTE — Therapy (Signed)
[facility] [place], [place], [address] Phone: [phone]   Fax:  [phone]  Wound Care Therapy  Patient Details  Name: Nathan Tyler MRN: [id number] Date of Birth: 02/25/1956 No Data Recorded  Encounter Date: 08/11/2015      PT End of Session - 08/11/15 1625    Visit Number 5   Number of Visits 16   Authorization Type BCBS   Authorization Time Period Certification dates 08/26/3873-6/43/3295   Authorization - Visit Number 5   Authorization - Number of Visits 10   PT Start Time 1884   PT Stop Time 1110   PT Time Calculation (min) 30 min   Activity Tolerance Patient tolerated treatment well   Behavior During Therapy [facility] for tasks assessed/performed      No past medical history on file.  No past surgical history on file.  There were no vitals filed for this visit.                  Wound Therapy - 08/11/15 1617    Subjective Dressings intact, wearing heel wedge boot.  States he returns to Dr. Luna Glasgow once weekly and he is to take pictures of his toe at each wound visit (with his personal camera phone)   Patient and Family Stated Goals for this wound to heal   Date of Onset 07/20/15   Prior Treatments self care   Pain Assessment No/denies pain   Wound Properties Date First Assessed: 07/26/15 Time First Assessed: 1016 Wound Type: Diabetic ulcer Location: Toe (Comment  which one) Location Orientation: Left Wound Description (Comments): 2nd toe apex   Dressing Type Gauze (Comment);Silver hydrofiber  Pt had toe cap and gauze toe macerated changed to silverhydr   Dressing Changed Changed   Dressing Status Old drainage   Dressing Change Frequency Every 3 days   Site / Wound Assessment Friable   % Wound base Red or Granulating 0%   % Wound base Yellow 95%   % Wound base Black 5%   Peri-wound Assessment Edema;Erythema (blanchable)  due to maceration pt has lost skin on entire toe with wound    Drainage Amount  Minimal   Drainage Description Serosanguineous;Purulent   Treatment Cleansed;Debridement (Selective)   Wound Properties Date First Assessed: 07/26/15 Wound Type: Diabetic ulcer Location: Toe (Comment  which one) Location Orientation: Left Wound Description (Comments): plantar aspect  Present on Admission: Yes   Dressing Type Impregnated gauze (petrolatum);Gauze (Comment)   Dressing Changed Changed   Dressing Status Old drainage   Dressing Change Frequency Every 3 days   Site / Wound Assessment Pink   % Wound base Red or Granulating 90%   % Wound base Yellow 10%   Drainage Amount None   Drainage Description --   Treatment Cleansed;Debridement (Selective)   Selective Debridement - Location wound bed   Selective Debridement - Tools Used Forceps;Scalpel   Selective Debridement - Tissue Removed slough, dead skin perimeter   Wound Therapy - Clinical Statement Pt is no longer using the toe cap.  Perimeter of toe very dry as well as the tip of toe.  Some drainage expressed when dry skin removed from tip, but overall very dry.  Overall integrity of second toe is poor with discoloration.   Changed dressing to medihoney gel and gauze packed into tip of toe and continued with xeroform around the toe to promote moisture.  No maceration present at all.  Pt states the surgeon will determine next week if  amputation is needed   Wound Therapy - Functional Problem List difficulty walking, uncomfortable to place a sock or a shoe on his foot.    Factors Delaying/Impairing Wound Healing Altered sensation;Diabetes Mellitus;Infection - systemic/local;Multiple medical problems;Polypharmacy   Hydrotherapy Plan Debridement;Dressing change;Patient/family education;Pulsatile lavage with suction   Wound Therapy - Frequency --  2x/week x 8 weeks   Wound Therapy - Current Recommendations PT   Wound Plan continue with present woundcare including pulsed lavage and drressings as appropriate.   Dressing  medihoney gauze packed  into tip of toe, xeroform placed around toe  2x2 and 2" kling                    PT Short Term Goals - 08/11/15 1626    PT SHORT TERM GOAL #1   Title Pt Lt toe pain to be decreased to no more than a 2/10 to be able to tolerate having a sock on his foot   Time 4   Period Weeks   Status Achieved   PT SHORT TERM GOAL #2   Title Pt wound to be 100% granulated to prevent local and systemic infection    Time 4   Period Weeks   Status Not Met   PT SHORT TERM GOAL #3   Title Pt to be able to tolerate walking for 30 minutes at a time.   Time 4   Period Weeks   Status Not Met   PT SHORT TERM GOAL #4   Title Pt to verbalize the importance of daily foot inspection to prevent diabetic ulcers   Time 2   Period Weeks   Status On-going           PT Long Term Goals - 08/11/15 1626    PT LONG TERM GOAL #1   Title Pt to have no pain in his left second toe to be able to tolerate wearing shoes and socks all day long.    Time 8   Period Weeks   Status On-going   PT LONG TERM GOAL #2   Title Pt wound to be 100% healed for reduced risk of celluliltis or other infections    Time 8   Period Weeks   Status Not Met   PT LONG TERM GOAL #3   Title Pt to be able to walk for up to 2 hours without any increased pain in his left second toe    Baseline 8   Period Weeks   Status On-going             Patient will benefit from skilled therapeutic intervention in order to improve the following deficits and impairments:     Visit Diagnosis: Difficulty in walking, not elsewhere classified  Pain in left toe(s)  Decubitus ulcer of foot, stage 4 (HCC)  Diabetic ulcer of toe associated with diabetes mellitus due to underlying condition, with necrosis of muscle (HCC)  Toe pain, left     Problem List There are no active problems to display for this patient.   Teena Irani, PTA/CLT [phone]  08/11/2015, 4:26 PM  [facility] [address] [place], [place], [address] Phone: [phone]   Fax:  [phone]  Name: Manuelito Poage MRN: [id number] Date of Birth: 09-Mar-1956

## 2015-08-16 ENCOUNTER — Ambulatory Visit (HOSPITAL_COMMUNITY): Payer: BLUE CROSS/BLUE SHIELD

## 2015-08-16 DIAGNOSIS — L97503 Non-pressure chronic ulcer of other part of unspecified foot with necrosis of muscle: Secondary | ICD-10-CM

## 2015-08-16 DIAGNOSIS — E08621 Diabetes mellitus due to underlying condition with foot ulcer: Secondary | ICD-10-CM | POA: Diagnosis not present

## 2015-08-16 DIAGNOSIS — L89894 Pressure ulcer of other site, stage 4: Secondary | ICD-10-CM

## 2015-08-16 DIAGNOSIS — R262 Difficulty in walking, not elsewhere classified: Secondary | ICD-10-CM

## 2015-08-16 DIAGNOSIS — M79675 Pain in left toe(s): Secondary | ICD-10-CM

## 2015-08-16 NOTE — Therapy (Signed)
[facility] [place], [place], [address] Phone: [phone]   Fax:  [phone]  Wound Care Therapy  Patient Details  Name: Nathan Tyler MRN: [id number] Date of Birth: 1956/01/20 No Data Recorded  Encounter Date: 08/16/2015      PT End of Session - 08/16/15 1721    Visit Number 6   Number of Visits 16   Date for PT Re-Evaluation 08/25/15   Authorization Type BCBS   Authorization Time Period Certification dates 10/29/5883-0/27/7412   Authorization - Visit Number 6   Authorization - Number of Visits 10   PT Start Time 1036   PT Stop Time 1115   PT Time Calculation (min) 39 min   Activity Tolerance Patient tolerated treatment well   Behavior During Therapy [facility] for tasks assessed/performed      No past medical history on file.  No past surgical history on file.  There were no vitals filed for this visit.       Subjective Assessment - 08/16/15 1125    Subjective Pt entered dept with new dressings stated his girlfriend changed dressings yesterday.  No reports of pain today.  Apt scheduled with MD on Thursday.   Currently in Pain? No/denies                   Wound Therapy - 08/16/15 1126    Subjective Pt entered dept with new dressings stated his girlfriend changed dressings yesterday.  No reports of pain today.  Apt scheduled with MD on Thursday.   Patient and Family Stated Goals for this wound to heal   Date of Onset 07/20/15   Prior Treatments self care   Pain Assessment No/denies pain   Pain Score 0-No pain   Evaluation and Treatment Procedures Explained to Patient/Family Yes   Evaluation and Treatment Procedures agreed to   Wound Properties Date First Assessed: 07/26/15 Time First Assessed: 1016 Wound Type: Diabetic ulcer Location: Toe (Comment  which one) Location Orientation: Left Wound Description (Comments): 2nd toe apex   Dressing Type Gauze (Comment)  medihoney in wound bed with xeroform on toe    Dressing Changed Changed   Dressing Status Old drainage   Dressing Change Frequency Every 3 days   Site / Wound Assessment Friable   % Wound base Red or Granulating 0%   % Wound base Yellow 95%   % Wound base Black 5%   Peri-wound Assessment Edema;Erythema (blanchable)   Drainage Amount Minimal   Drainage Description Serosanguineous;Purulent   Treatment Cleansed;Debridement (Selective);Hydrotherapy (Pulse lavage)   Wound Properties Date First Assessed: 07/26/15 Wound Type: Diabetic ulcer Location: Toe (Comment  which one) Location Orientation: Left Wound Description (Comments): plantar aspect  Present on Admission: Yes   Dressing Type Impregnated gauze (petrolatum);Gauze (Comment)   Dressing Changed Changed   Dressing Status Old drainage   Dressing Change Frequency Every 3 days   Site / Wound Assessment Pink   % Wound base Red or Granulating 90%   % Wound base Yellow 10%   Drainage Amount None   Treatment Cleansed;Debridement (Selective)   Selective Debridement - Location wound bed   Selective Debridement - Tools Used Forceps;Scalpel   Selective Debridement - Tissue Removed slough, dead skin perimeter   Wound Therapy - Clinical Statement Signiciant amount of dry skin removed from 2nd toe with yellow exudate from wound bed upon removal of skin.  Continued with PLS for wound bed cleaning and selective debridement for removal of slough.  Continued  with medihoney on wound bed and xeroform on plantar surface with gauze and netting.  Picture taken for MD with pt.'s personal phone.  No reports of pain through session.   Wound Therapy - Functional Problem List difficulty walking, uncomfortable to place a sock or a shoe on his foot.    Factors Delaying/Impairing Wound Healing Altered sensation;Diabetes Mellitus;Infection - systemic/local;Multiple medical problems;Polypharmacy   Hydrotherapy Plan Debridement;Dressing change;Patient/family education;Pulsatile lavage with suction   Wound Therapy -  Frequency --  2x/ week for 8 weeks   Wound Therapy - Current Recommendations PT   Wound Plan continue with present woundcare including pulsed lavage and drressings as appropriate.  F/U with MD apt later this week.     Dressing  medihoney gauze packed into tip of toe, xeroform placed around toe  2x2 and 2" kling                    PT Short Term Goals - 08/11/15 1626    PT SHORT TERM GOAL #1   Title Pt Lt toe pain to be decreased to no more than a 2/10 to be able to tolerate having a sock on his foot   Time 4   Period Weeks   Status Achieved   PT SHORT TERM GOAL #2   Title Pt wound to be 100% granulated to prevent local and systemic infection    Time 4   Period Weeks   Status Not Met   PT SHORT TERM GOAL #3   Title Pt to be able to tolerate walking for 30 minutes at a time.   Time 4   Period Weeks   Status Not Met   PT SHORT TERM GOAL #4   Title Pt to verbalize the importance of daily foot inspection to prevent diabetic ulcers   Time 2   Period Weeks   Status On-going           PT Long Term Goals - 08/11/15 1626    PT LONG TERM GOAL #1   Title Pt to have no pain in his left second toe to be able to tolerate wearing shoes and socks all day long.    Time 8   Period Weeks   Status On-going   PT LONG TERM GOAL #2   Title Pt wound to be 100% healed for reduced risk of celluliltis or other infections    Time 8   Period Weeks   Status Not Met   PT LONG TERM GOAL #3   Title Pt to be able to walk for up to 2 hours without any increased pain in his left second toe    Baseline 8   Period Weeks   Status On-going             Patient will benefit from skilled therapeutic intervention in order to improve the following deficits and impairments:     Visit Diagnosis: Difficulty in walking, not elsewhere classified  Pain in left toe(s)  Decubitus ulcer of foot, stage 4 (HCC)  Diabetic ulcer of toe associated with diabetes mellitus due to underlying  condition, with necrosis of muscle ([place])     Problem List There are no active problems to display for this patient.  [address], LPTA; [place]  Aldona Lento 08/16/2015, 5:26 PM  [facility] [address] [place], [place], [address] Phone: [phone]   Fax:  [phone]  Name: Nathan Tyler MRN: [id number] Date of Birth: 1955/07/22

## 2015-08-18 ENCOUNTER — Ambulatory Visit (HOSPITAL_COMMUNITY): Payer: BLUE CROSS/BLUE SHIELD | Admitting: Physical Therapy

## 2015-08-18 ENCOUNTER — Encounter: Payer: Self-pay | Admitting: Orthopaedic Surgery

## 2015-08-18 ENCOUNTER — Ambulatory Visit (INDEPENDENT_AMBULATORY_CARE_PROVIDER_SITE_OTHER): Payer: BLUE CROSS/BLUE SHIELD | Admitting: Orthopaedic Surgery

## 2015-08-18 VITALS — BP 109/85 | HR 93 | Ht 71.0 in | Wt 195.0 lb

## 2015-08-18 DIAGNOSIS — L89894 Pressure ulcer of other site, stage 4: Secondary | ICD-10-CM

## 2015-08-18 DIAGNOSIS — I10 Essential (primary) hypertension: Secondary | ICD-10-CM | POA: Diagnosis not present

## 2015-08-18 DIAGNOSIS — R262 Difficulty in walking, not elsewhere classified: Secondary | ICD-10-CM

## 2015-08-18 DIAGNOSIS — M869 Osteomyelitis, unspecified: Secondary | ICD-10-CM | POA: Diagnosis not present

## 2015-08-18 DIAGNOSIS — E1051 Type 1 diabetes mellitus with diabetic peripheral angiopathy without gangrene: Secondary | ICD-10-CM | POA: Diagnosis not present

## 2015-08-18 DIAGNOSIS — M79675 Pain in left toe(s): Secondary | ICD-10-CM

## 2015-08-18 DIAGNOSIS — E08621 Diabetes mellitus due to underlying condition with foot ulcer: Secondary | ICD-10-CM | POA: Diagnosis not present

## 2015-08-18 NOTE — Therapy (Signed)
[place], [place], [address] Phone: [phone]   Fax:  [phone]  Wound Care Therapy  Patient Details  Name: Nathan Tyler MRN: [id number] Date of Birth: June 27, 1955 No Data Recorded  Encounter Date: 08/18/2015      PT End of Session - 08/18/15 1422    Visit Number 7   Number of Visits 16   Date for PT Re-Evaluation 08/25/15   Authorization Type BCBS   Authorization Time Period Certification dates 05/25/252-2/70/6237   Authorization - Visit Number 7   Authorization - Number of Visits 10   PT Start Time 6283   PT Stop Time 1115   PT Time Calculation (min) 35 min   Activity Tolerance Patient tolerated treatment well   Behavior During Therapy [facility] for tasks assessed/performed      No past medical history on file.  No past surgical history on file.  There were no vitals filed for this visit.                  Wound Therapy - 08/18/15 1420    Subjective Pt with dressing intact and states it has not been removed since last visit.  No pain.    Patient and Family Stated Goals for this wound to heal   Date of Onset 07/20/15   Prior Treatments self care   Pain Assessment No/denies pain   Wound Properties Date First Assessed: 07/26/15 Time First Assessed: 1016 Wound Type: Diabetic ulcer Location: Toe (Comment  which one) Location Orientation: Left Wound Description (Comments): 2nd toe apex   Dressing Type Gauze (Comment)  medihoney in wound bed with xeroform on toe   Dressing Changed Changed   Dressing Status Old drainage   Dressing Change Frequency Every 3 days   Site / Wound Assessment Friable   % Wound base Red or Granulating 0%   % Wound base Yellow 95%   % Wound base Black 5%   Peri-wound Assessment Edema;Erythema (blanchable)   Drainage Amount Minimal   Drainage Description Serosanguineous;Purulent   Treatment Hydrotherapy (Pulse lavage);Cleansed;Debridement (Selective)   Wound Properties  Date First Assessed: 07/26/15 Wound Type: Diabetic ulcer Location: Toe (Comment  which one) Location Orientation: Left Wound Description (Comments): plantar aspect  Present on Admission: Yes   Dressing Type Impregnated gauze (petrolatum);Gauze (Comment)   Dressing Changed Changed   Dressing Status Old drainage   Dressing Change Frequency Every 3 days   Site / Wound Assessment Pink   % Wound base Red or Granulating 100%   % Wound base Yellow 10%   Drainage Amount None   Treatment Cleansed;Debridement (Selective)   Selective Debridement - Location wound bed   Selective Debridement - Tools Used Forceps;Scalpel   Selective Debridement - Tissue Removed slough, dead skin perimeter   Wound Therapy - Clinical Statement No Signiciant change in appearance of 2nd toe with yellow exudate from wound bed upon removal of packing.  Overall integrity of toe now spongy with little structural soundness.   Continued with PLS for wound bed cleaning and selective debridement for removal of slough.  Continued with medihoney packed into wound.  No dressing needed for plantar surface this session.   Picture taken for MD with pt.'s personal phone.  No reports of pain through session.  Returns to MD this afternoon to determine if amputation is needed.    Wound Therapy - Functional Problem List difficulty walking, uncomfortable to place a sock or a shoe on his foot.  Factors Delaying/Impairing Wound Healing Altered sensation;Diabetes Mellitus;Infection - systemic/local;Multiple medical problems;Polypharmacy   Hydrotherapy Plan Debridement;Dressing change;Patient/family education;Pulsatile lavage with suction   Wound Therapy - Frequency --  2x/ week for 8 weeks   Wound Therapy - Current Recommendations PT   Wound Plan continue with present woundcare including pulsed lavage and dressings as appropriate.  F/U with MD apt later this week.     Dressing  medihoney gauze packed into tip of toe, xeroform placed around toe  2x2  and 2" kling                    PT Short Term Goals - 08/11/15 1626    PT SHORT TERM GOAL #1   Title Pt Lt toe pain to be decreased to no more than a 2/10 to be able to tolerate having a sock on his foot   Time 4   Period Weeks   Status Achieved   PT SHORT TERM GOAL #2   Title Pt wound to be 100% granulated to prevent local and systemic infection    Time 4   Period Weeks   Status Not Met   PT SHORT TERM GOAL #3   Title Pt to be able to tolerate walking for 30 minutes at a time.   Time 4   Period Weeks   Status Not Met   PT SHORT TERM GOAL #4   Title Pt to verbalize the importance of daily foot inspection to prevent diabetic ulcers   Time 2   Period Weeks   Status On-going           PT Long Term Goals - 08/11/15 1626    PT LONG TERM GOAL #1   Title Pt to have no pain in his left second toe to be able to tolerate wearing shoes and socks all day long.    Time 8   Period Weeks   Status On-going   PT LONG TERM GOAL #2   Title Pt wound to be 100% healed for reduced risk of celluliltis or other infections    Time 8   Period Weeks   Status Not Met   PT LONG TERM GOAL #3   Title Pt to be able to walk for up to 2 hours without any increased pain in his left second toe    Baseline 8   Period Weeks   Status On-going             Patient will benefit from skilled therapeutic intervention in order to improve the following deficits and impairments:     Visit Diagnosis: Difficulty in walking, not elsewhere classified  Pain in left toe(s)  Decubitus ulcer of foot, stage 4 (HCC)     Problem List There are no active problems to display for this patient.   Teena Irani, PTA/CLT [phone] 08/18/2015, 2:23 PM  [place], [place], [address] Phone: [phone]   Fax:  [phone]  Name: Nathan Tyler MRN: [id number] Date of Birth: 1956/01/26

## 2015-08-18 NOTE — Progress Notes (Addendum)
Patient MR:[id number] Nathan Tyler, male DOB:06/27/1955, 61 y.o. [id number]  Chief Complaint  Patient presents with  . Follow-up    left toe    HPI  Nathan Tyler is a 60 y.o. male who has osteomyelitis of the second toe on the left.  He has severe destruction of the middle phalanx of the second toe on the left.  It is a floppy toe now.  He has been on doxycycline 100 po bid by me for the last ten days.  He has much less redness, no drainage.  He has been going to [facility] for wound care and that has helped significantly as well.   HPI  Body mass index is 27.21 kg/(m^2).   Review of Systems  HENT: Negative for congestion.   Respiratory: Negative for cough and shortness of breath.   Cardiovascular: Negative for chest pain and leg swelling.  Endocrine: Negative for cold intolerance.  Musculoskeletal: Positive for joint swelling and gait problem.  Allergic/Immunologic: Positive for environmental allergies.    History reviewed. No pertinent past medical history.  History reviewed. No pertinent past surgical history.  History reviewed. No pertinent family history.  Social History Social History  Substance Use Topics  . Smoking status: Never Smoker   . Smokeless tobacco: None  . Alcohol Use: None    No Known Allergies  Current Outpatient Prescriptions  Medication Sig Dispense Refill  . doxycycline (VIBRAMYCIN) 50 MG capsule Take two pills twice a day 40 capsule 2  . Dulaglutide (TRULICITY Nathan Tyler) Inject into the skin.    . Insulin Degludec (TRESIBA FLEXTOUCH) 100 UNIT/ML SOPN Inject into the skin.    Marland Kitchen levothyroxine (SYNTHROID, LEVOTHROID) 112 MCG tablet TK 1 T PO QD  2  . lisinopril (PRINIVIL,ZESTRIL) 10 MG tablet TK 1 T PO  QD  5  . mycophenolate (CELLCEPT) 500 MG tablet TK 1 T PO BID  6  . predniSONE (DELTASONE) 5 MG tablet TK 1 T PO D  6  . tacrolimus (PROGRAF) 0.5 MG capsule TK 4 CS PO BID  6  . tetrabenazine (XENAZINE) 12.5 MG tablet   1   No current facility-administered medications  for this visit.     Physical Exam  Blood pressure 109/85, pulse 93, height 5\' 11"  (1.803 m), weight 195 lb (88.451 kg).  Constitutional: overall normal hygiene, normal nutrition, well developed, normal grooming, normal body habitus. Assistive device:none  Musculoskeletal: gait and station Limp left, muscle tone and strength are normal, no tremors or atrophy is present.  .  Neurological: coordination overall normal.  Deep tendon reflex/nerve stretch intact.  Sensation normal.  Cranial nerves II-XII intact.   Skin:   normal overall no scars, lesions, ulcers or rashes. No psoriasis.  Psychiatric: Alert and oriented x 3.  Recent memory intact, remote memory unclear.  Normal mood and affect. Well groomed.  Good eye contact.  Cardiovascular: overall no swelling, no varicosities, no edema bilaterally, normal temperatures of the legs and arms, no clubbing, cyanosis and good capillary refill.  Lymphatic: palpation is normal.  The left second toe is not red today at all.  There is no drainage.  He has lost the nail to the toe during the week.  It is significantly improved.  The toe is completely floppy at the PIP joint.  Sensation is significantly decreased to the toe and pulses are present to the foot but diminished.   The patient has been educated about the nature of the problem(s) and counseled on treatment options.  The patient appeared to  understand what I have discussed and is in agreement with it.  Encounter Diagnoses  Name Primary?  . Osteomyelitis of toe of left foot (Nathan Tyler) Yes  . Type 1 diabetes mellitus with peripheral circulatory complications ([place])   . Essential hypertension, benign    I have had a good long frank discussion with him about his toe.  At this point, the acute cellulitis he had around the toe has gone but he still has the osteomyelitis of the middle phalanx which is essentially destroyed by the infection.  He has no drainage.  He has responded well.  I would  recommend complete or partial amputation of the left second toe now.  The would remove the source of new infection as osteomyelitis never completely resolves in an adult and the toe is so floppy it could become in the way or infected just by the loss of control.  I do not make this recommendation lightly but I feel it will give him the best result and hopefully prevent further problems.  He is at risk because of his diabetes and significant peripheral neuropathy.  Healing is a concern but he is at a good place now.  He is in agreement to the amputation.  I will ask him to see Dr. Arnoldo Tyler as I no longer do surgery.  PLAN Call if any problems.  Precautions discussed.  Continue current medications. I told him to refill his antibiotics again and continue them.  Return to clinic:  To see Dr. Arnoldo Tyler for consideration of amputation.

## 2015-08-19 ENCOUNTER — Telehealth: Payer: Self-pay | Admitting: *Deleted

## 2015-08-19 NOTE — Addendum Note (Signed)
Addended by: Baldomero Lamy B on: 08/19/2015 01:40 PM   Modules accepted: Orders

## 2015-08-19 NOTE — Telephone Encounter (Signed)
REFERRAL FAXED TO DR Arnoldo Morale

## 2015-08-22 ENCOUNTER — Telehealth: Payer: Self-pay

## 2015-08-23 ENCOUNTER — Other Ambulatory Visit (HOSPITAL_COMMUNITY): Payer: Self-pay | Admitting: General Surgery

## 2015-08-23 ENCOUNTER — Ambulatory Visit (HOSPITAL_COMMUNITY): Payer: BLUE CROSS/BLUE SHIELD | Attending: Internal Medicine | Admitting: Physical Therapy

## 2015-08-23 DIAGNOSIS — R262 Difficulty in walking, not elsewhere classified: Secondary | ICD-10-CM | POA: Insufficient documentation

## 2015-08-23 DIAGNOSIS — M79675 Pain in left toe(s): Secondary | ICD-10-CM | POA: Diagnosis present

## 2015-08-23 DIAGNOSIS — M869 Osteomyelitis, unspecified: Secondary | ICD-10-CM

## 2015-08-23 NOTE — Therapy (Signed)
[place], [place], [address] Phone: [phone]   Fax:  [phone]  Wound Care Therapy  Patient Details  Name: Nathan Tyler MRN: [id number] Date of Birth: 09-Aug-1955 No Data Recorded  Encounter Date: 08/23/2015      PT End of Session - 08/23/15 1117    Visit Number 8   Number of Visits 16   Date for PT Re-Evaluation 08/25/15   Authorization Type [employer]   Authorization Time Period Certification dates 10/28/2421-5/36/1443   Authorization - Visit Number 8   Authorization - Number of Visits 10   PT Start Time 1540   PT Stop Time 1105   PT Time Calculation (min) 30 min   Activity Tolerance Patient tolerated treatment well   Behavior During Therapy [facility] for tasks assessed/performed      No past medical history on file.  No past surgical history on file.  There were no vitals filed for this visit.                  Wound Therapy - 08/23/15 1108    Subjective Pt states he just seen the surgeon Arnoldo Morale) with amputation set for next Wednesday.  Currently no pain.   Date of Onset 07/20/15   Prior Treatments self care   Pain Assessment No/denies pain   Wound Properties Date First Assessed: 07/26/15 Time First Assessed: 1016 Wound Type: Diabetic ulcer Location: Toe (Comment  which one) Location Orientation: Left Wound Description (Comments): 2nd toe apex   Dressing Type None   Dressing Changed Changed   Dressing Status Old drainage   Dressing Change Frequency Every 3 days   Site / Wound Assessment Friable   % Wound base Red or Granulating 0%   % Wound base Yellow 95%   % Wound base Black 5%   Peri-wound Assessment Edema;Erythema (blanchable)   Drainage Amount Minimal   Drainage Description Serosanguineous;Purulent   Treatment Cleansed;Debridement (Selective)   Wound Properties Date First Assessed: 07/26/15 Wound Type: Diabetic ulcer Location: Toe (Comment  which one) Location Orientation: Left Wound  Description (Comments): plantar aspect  Present on Admission: Yes   Dressing Type None   Dressing Changed Changed   Dressing Status Old drainage   Dressing Change Frequency Every 3 days   Site / Wound Assessment Pink   % Wound base Red or Granulating 100%   % Wound base Yellow 10%   Drainage Amount None   Treatment Cleansed;Debridement (Selective)   Selective Debridement - Location wound bed   Selective Debridement - Tools Used Forceps   Selective Debridement - Tissue Removed slough, dead skin perimeter   Wound Therapy - Clinical Statement Wound without dressing due to just leaving surgeon. Removal of dry skin  from 2nd toe with yellow exudate from wound bed upon removal of scabbed overlay.  Irrigated and changed dressing using medihoney.  Pt instructed to have girlfriend keep wound clean and changed until surgery.  Pt given remaining dressings to use until this point.  Pt no longer requires skilled care at this point as toe is nonviable with amputation sceduled for next week.  Pt may be discharged at this time.   Wound Therapy - Functional Problem List difficulty walking, uncomfortable to place a sock or a shoe on his foot.    Factors Delaying/Impairing Wound Healing Altered sensation;Diabetes Mellitus;Infection - systemic/local;Multiple medical problems;Polypharmacy   Hydrotherapy Plan Debridement;Dressing change;Patient/family education   Wound Therapy - Frequency --   Wound Therapy - Current Recommendations  PT   Wound Plan Discharge as not longer requires skilled services.   Dressing  medihoney gauze packed into tip of toe, xeroform placed around toe  2x2 and 2" kling                  PT Education - 08/23/15 1115    Education provided Yes   Education Details Have girlfriend keep wound clean and dry, using appropriate dressing.  Contact office if any other questions or issues arise.   Person(s) Educated Patient   Methods Explanation   Comprehension Verbalized understanding           PT Short Term Goals - 08/23/15 1114    PT SHORT TERM GOAL #1   Title Pt Lt toe pain to be decreased to no more than a 2/10 to be able to tolerate having a sock on his foot   Time 4   Period Weeks   Status Achieved   PT SHORT TERM GOAL #2   Title Pt wound to be 100% granulated to prevent local and systemic infection    Time 4   Period Weeks   Status Not Met   PT SHORT TERM GOAL #3   Title Pt to be able to tolerate walking for 30 minutes at a time.   Time 4   Period Weeks   Status Not Met   PT SHORT TERM GOAL #4   Title Pt to verbalize the importance of daily foot inspection to prevent diabetic ulcers   Time 2   Period Weeks   Status Achieved           PT Long Term Goals - 08/23/15 1115    PT LONG TERM GOAL #1   Title Pt to have no pain in his left second toe to be able to tolerate wearing shoes and socks all day long.    Time 8   Period Weeks   Status On-going   PT LONG TERM GOAL #2   Title Pt wound to be 100% healed for reduced risk of celluliltis or other infections    Time 8   Period Weeks   Status Not Met   PT LONG TERM GOAL #3   Title Pt to be able to walk for up to 2 hours without any increased pain in his left second toe    Baseline 8   Period Weeks   Status Not Met             Patient will benefit from skilled therapeutic intervention in order to improve the following deficits and impairments:     Visit Diagnosis: Difficulty in walking, not elsewhere classified  Pain in left toe(s)     Problem List There are no active problems to display for this patient.   Teena Irani, PTA/CLT [phone]  08/23/2015, 11:17 AM   PHYSICAL THERAPY DISCHARGE SUMMARY  Visits from Start of Care: 8  Current functional level related to goals / functional outcomes: Patient discharging due to toe being unviable and scheduled for amputation by MD; no further skilled services needed at this point.    Remaining deficits: Non healing wound     Education / Equipment: DC skilled PT wound care due to pending amputation  Plan: Patient agrees to discharge.  Patient goals were partially met. Patient is being discharged due to lack of progress.  ?????        Deniece Ree PT, DPT [address]  [place], [place], [address] Phone: [phone]   Fax:  [phone]  Name: Nathan Tyler MRN: [id number] Date of Birth: 02/08/1956

## 2015-08-24 NOTE — H&P (Signed)
  NTS SOAP Note  Vital Signs:  Vitals as of: [id number]: Systolic [id number]: Diastolic 86: Heart Rate 81: Temp 98.49F (Temporal): Height 89ft 11in: Weight 193Lbs 0 Ounces: BMI 26.92   BMI : 26.92 kg/m2  Subjective: This 60 year old male presents for of osteomyelitis, left second toe.  Referred by Dr. Luna Glasgow.  Has had swelling and infection of the left second toe in the past.  Was on an antibiotic.  Not painful at this time.  Sensation intact.  No drainage noted from toe.  No fever, chills.  Swelling decreased recently.  Review of Symptoms:  Constitutional:negative Head:negative Eyes:negative Nose/Mouth/Throat:negative Cardiovascular:negative Respiratory:negative Gastrointestinnegative Genitourinary:negative movement disorder Skin:negative transplant medications   Past Medical History:Reviewed  Past Medical History  Pmed:  hypothryoidism, HTN, movement disorder, NIDDM Psurg:  kidney transplant 2010 Meds:  levothyroxine, lisinopril, mycophenalate, trichrolinus, prednisone, trubicity, treohiba, tetraxendine Allergies:  nkda   Social History:Reviewed  Social History  Preferred Language: English Race:  White Ethnicity: Not Hispanic / Latino Age: 56 year Marital Status:  W Alcohol: socially   Smoking Status: Never smoker reviewed on 08/23/2015 Functional Status reviewed on 08/23/2015 ------------------------------------------------ Bathing: Normal Cooking: Normal Dressing: Normal Driving: Normal Eating: Normal Managing Meds: Normal Oral Care: Normal Shopping: Normal Toileting: Normal Transferring: Normal Walking: Normal Cognitive Status reviewed on 08/23/2015 ------------------------------------------------ Attention: Normal Decision Making: Normal Language: Normal Memory: Normal Motor: Normal Perception: Normal Problem Solving: Normal Visual and Spatial: Normal   Family History:Reviewed  Family Health History Mother, Healthy;  Father,  Healthy;     Objective Information: General:Well appearing, well nourished in no distress. Head:Atraumatic; no masses; no abnormalities Neck:Supple without lymphadenopathy.  Heart:RRR, no murmur or gallop.  Normal S1, S2.  No S3, S4.  Lungs:CTA bilaterally, no wheezes, rhonchi, rales.  Breathing unlabored. Left second toe swollen but not tender, erythematous.  No open wound.  PT/DP pulses intact.  Assessment:Left second toe osteromyelitis  Diagnoses: 730.07  M86.172 Acute osteomyelitis of phalanx of toe (Other acute osteomyelitis, left ankle and foot)  Procedures: [id number] - OFFICE OUTPATIENT NEW 30 MINUTES    Plan:  Scheduled for left second toe amputation on 09/07/15.   Patient Education:Alternative treatments to surgery were discussed with patient (and family).Risks and benefits  of procedure including bleeding and infection were fully explained to the patient (and family) who gave informed consent. Patient/family questions were addressed.  Follow-up:Pending Surgery

## 2015-08-24 NOTE — Patient Instructions (Addendum)
Nathan Tyler  08/24/2015     @PREFPERIOPPHARMACY @   Your procedure is scheduled on 09/02/2015.  Report to [name] at 8:20 A.M.  Call this number if you have problems the morning of surgery:  [phone]   Remember:  Do not eat food or drink liquids after midnight.  Take these medicines the morning of surgery with A SIP OF WATER Synthroid, Lisinopril, Cellecept, Prograf, Xenazine   DO NOT TAKE DIABETIC MEDICATION DAY OF PROCEDURE  TAKE ONLY 1/2 DOSE OF INSULIN EVENING PRIOR   Do not wear jewelry, make-up or nail polish.  Do not wear lotions, powders, or perfumes.  You may wear deodorant.  Do not shave 48 hours prior to surgery.  Men may shave face and neck.  Do not bring valuables to the hospital.  [facility] is not responsible for any belongings or valuables.  Contacts, dentures or bridgework may not be worn into surgery.  Leave your suitcase in the car.  After surgery it may be brought to your room.  For patients admitted to the hospital, discharge time will be determined by your treatment team.  Patients discharged the day of surgery will not be allowed to drive home.    Please read over the following fact sheets that you were given. Surgical Site Infection Prevention and Anesthesia Post-op Instructions     PATIENT INSTRUCTIONS POST-ANESTHESIA  IMMEDIATELY FOLLOWING SURGERY:  Do not drive or operate machinery for the first twenty four hours after surgery.  Do not make any important decisions for twenty four hours after surgery or while taking narcotic pain medications or sedatives.  If you develop intractable nausea and vomiting or a severe headache please notify your doctor immediately.  FOLLOW-UP:  Please make an appointment with your surgeon as instructed. You do not need to follow up with anesthesia unless specifically instructed to do so.  WOUND CARE INSTRUCTIONS (if applicable):  Keep a dry clean dressing on the anesthesia/puncture wound site if there is  drainage.  Once the wound has quit draining you may leave it open to air.  Generally you should leave the bandage intact for twenty four hours unless there is drainage.  If the epidural site drains for more than 36-48 hours please call the anesthesia department.  QUESTIONS?:  Please feel free to call your physician or the hospital operator if you have any questions, and they will be happy to assist you.      Incision Care An incision is when a surgeon cuts into your body. After surgery, the incision needs to be cared for properly to prevent infection.  HOW TO CARE FOR YOUR INCISION  Take medicines only as directed by your health care provider.  There are many different ways to close and cover an incision, including stitches, skin glue, and adhesive strips. Follow your health care provider's instructions on:  Incision care.  Bandage (dressing) changes and removal.  Incision closure removal.  Do not take baths, swim, or use a hot tub until your health care provider approves. You may shower as directed by your health care provider.  Resume your normal diet and activities as directed.  Use anti-itch medicine (such as an antihistamine) as directed by your health care provider. The incision may itch while it is healing. Do not pick or scratch at the incision.  Drink enough fluid to keep your urine clear or pale yellow. SEEK MEDICAL CARE IF:   You have drainage, redness, swelling, or pain at your incision site.  You have muscle aches, chills, or a general ill feeling.  You notice a bad smell coming from the incision or dressing.  Your incision edges separate after the sutures, staples, or skin adhesive strips have been removed.  You have persistent nausea or vomiting.  You have a fever.  You are dizzy. SEEK IMMEDIATE MEDICAL CARE IF:   You have a rash.  You faint.  You have difficulty breathing. MAKE SURE YOU:   Understand these instructions.  Will watch your  condition.  Will get help right away if you are not doing well or get worse.   This information is not intended to replace advice given to you by your health care provider. Make sure you discuss any questions you have with your health care provider.   Document Released: 10/27/2004 Document Revised: 04/30/2014 Document Reviewed: 06/03/2013 Elsevier Interactive Patient Education [employer].

## 2015-08-25 ENCOUNTER — Encounter (HOSPITAL_COMMUNITY)
Admission: RE | Admit: 2015-08-25 | Discharge: 2015-08-25 | Disposition: A | Discharge status: Home / Self Care | Payer: BLUE CROSS/BLUE SHIELD | Source: Ambulatory Visit | Attending: General Surgery | Admitting: General Surgery

## 2015-08-25 ENCOUNTER — Encounter (HOSPITAL_COMMUNITY): Payer: Self-pay

## 2015-08-25 ENCOUNTER — Ambulatory Visit (HOSPITAL_COMMUNITY)
Admission: RE | Admit: 2015-08-25 | Discharge: 2015-08-25 | Disposition: A | Discharge status: Home / Self Care | Payer: BLUE CROSS/BLUE SHIELD | Source: Ambulatory Visit | Attending: General Surgery | Admitting: General Surgery

## 2015-08-25 ENCOUNTER — Ambulatory Visit (HOSPITAL_COMMUNITY): Payer: BLUE CROSS/BLUE SHIELD

## 2015-08-25 DIAGNOSIS — M869 Osteomyelitis, unspecified: Secondary | ICD-10-CM | POA: Diagnosis not present

## 2015-08-25 HISTORY — DX: Hypothyroidism, unspecified: E03.9

## 2015-08-25 HISTORY — DX: Other chorea: G25.5

## 2015-08-25 HISTORY — DX: Type 2 diabetes mellitus without complications: E11.9

## 2015-08-25 HISTORY — DX: Myoneural disorder, unspecified: G70.9

## 2015-08-25 LAB — CBC WITH DIFFERENTIAL/PLATELET
BASOS ABS: 0 10*3/uL (ref 0.0–0.1)
BASOS PCT: 0 %
Eosinophils Absolute: 0 10*3/uL (ref 0.0–0.7)
Eosinophils Relative: 0 %
HEMATOCRIT: 37.9 % — AB (ref 39.0–52.0)
HEMOGLOBIN: 12.3 g/dL — AB (ref 13.0–17.0)
Lymphocytes Relative: 33 %
Lymphs Abs: 3 10*3/uL (ref 0.7–4.0)
MCH: 29.1 pg (ref 26.0–34.0)
MCHC: 32.5 g/dL (ref 30.0–36.0)
MCV: 89.8 fL (ref 78.0–100.0)
Monocytes Absolute: 0.4 10*3/uL (ref 0.1–1.0)
Monocytes Relative: 4 %
Neutro Abs: 5.7 10*3/uL (ref 1.7–7.7)
Neutrophils Relative %: 63 %
Platelets: 327 10*3/uL (ref 150–400)
RBC: 4.22 MIL/uL (ref 4.22–5.81)
RDW: 14.4 % (ref 11.5–15.5)
WBC: 9.1 10*3/uL (ref 4.0–10.5)

## 2015-08-25 LAB — BASIC METABOLIC PANEL
Anion gap: 7 (ref 5–15)
BUN: 36 mg/dL — AB (ref 6–20)
CO2: 26 mmol/L (ref 22–32)
Calcium: 9.5 mg/dL (ref 8.9–10.3)
Chloride: 107 mmol/L (ref 101–111)
Creatinine, Ser: 1.56 mg/dL — ABNORMAL HIGH (ref 0.61–1.24)
GFR calc Af Amer: 54 mL/min — ABNORMAL LOW (ref >60)
GFR, EST NON AFRICAN AMERICAN: 46 mL/min — AB (ref >60)
Glucose, Bld: 103 mg/dL — ABNORMAL HIGH (ref 65–99)
POTASSIUM: 4.8 mmol/L (ref 3.5–5.1)
SODIUM: 140 mmol/L (ref 135–145)

## 2015-08-26 LAB — HEMOGLOBIN A1C
Hgb A1c MFr Bld: 9.4 % — ABNORMAL HIGH (ref 4.8–5.6)
MEAN PLASMA GLUCOSE: 223 mg/dL

## 2015-08-30 ENCOUNTER — Ambulatory Visit (HOSPITAL_COMMUNITY): Payer: BLUE CROSS/BLUE SHIELD | Admitting: Physical Therapy

## 2015-08-30 NOTE — Telephone Encounter (Signed)
Nathan Tyler saw Dr. Arnoldo Morale on 08/23/15 and is scheduled for surgery 09/02/15

## 2015-09-01 ENCOUNTER — Ambulatory Visit (HOSPITAL_COMMUNITY): Payer: BLUE CROSS/BLUE SHIELD | Admitting: Physical Therapy

## 2015-09-02 ENCOUNTER — Ambulatory Visit (HOSPITAL_COMMUNITY): Payer: BLUE CROSS/BLUE SHIELD | Admitting: Anesthesiology

## 2015-09-02 ENCOUNTER — Encounter (HOSPITAL_COMMUNITY): Payer: Self-pay | Admitting: *Deleted

## 2015-09-02 ENCOUNTER — Ambulatory Visit (HOSPITAL_COMMUNITY)
Admission: RE | Admit: 2015-09-02 | Discharge: 2015-09-02 | Disposition: A | Discharge status: Home / Self Care | Payer: BLUE CROSS/BLUE SHIELD | Source: Ambulatory Visit | Attending: General Surgery | Admitting: General Surgery

## 2015-09-02 ENCOUNTER — Encounter (HOSPITAL_COMMUNITY)
Admission: RE | Disposition: A | Discharge status: Home / Self Care | Payer: Self-pay | Source: Ambulatory Visit | Attending: General Surgery

## 2015-09-02 DIAGNOSIS — E119 Type 2 diabetes mellitus without complications: Secondary | ICD-10-CM | POA: Insufficient documentation

## 2015-09-02 DIAGNOSIS — I10 Essential (primary) hypertension: Secondary | ICD-10-CM | POA: Diagnosis not present

## 2015-09-02 DIAGNOSIS — Z794 Long term (current) use of insulin: Secondary | ICD-10-CM | POA: Diagnosis not present

## 2015-09-02 DIAGNOSIS — Z79899 Other long term (current) drug therapy: Secondary | ICD-10-CM | POA: Diagnosis not present

## 2015-09-02 DIAGNOSIS — E039 Hypothyroidism, unspecified: Secondary | ICD-10-CM | POA: Diagnosis not present

## 2015-09-02 DIAGNOSIS — M869 Osteomyelitis, unspecified: Secondary | ICD-10-CM | POA: Diagnosis not present

## 2015-09-02 DIAGNOSIS — Z940 Kidney transplant status: Secondary | ICD-10-CM | POA: Insufficient documentation

## 2015-09-02 HISTORY — PX: AMPUTATION TOE: SHX6595

## 2015-09-02 LAB — GLUCOSE, CAPILLARY
GLUCOSE-CAPILLARY: 122 mg/dL — AB (ref 65–99)
GLUCOSE-CAPILLARY: 106 mg/dL — AB (ref 65–99)

## 2015-09-02 SURGERY — AMPUTATION, TOE
Anesthesia: Regional | Site: Toe | Laterality: Left

## 2015-09-02 MED ORDER — PROPOFOL 10 MG/ML IV BOLUS
INTRAVENOUS | Status: DC | PRN
  Administered 2015-09-02: 10 mg via INTRAVENOUS

## 2015-09-02 MED ORDER — LIDOCAINE HCL (PF) 1 % IJ SOLN
INTRAMUSCULAR | Status: AC
  Filled 2015-09-02: qty 10

## 2015-09-02 MED ORDER — HYDROCODONE-ACETAMINOPHEN 5-325 MG PO TABS: 1.0000 | ORAL_TABLET | Freq: Four times a day (QID) | ORAL | Status: DC | PRN

## 2015-09-02 MED ORDER — LACTATED RINGERS IV SOLN
INTRAVENOUS | Status: DC
  Administered 2015-09-02: 1000 mL via INTRAVENOUS

## 2015-09-02 MED ORDER — ONDANSETRON HCL 4 MG/2ML IJ SOLN: 4.0000 mg | Freq: Once | INTRAMUSCULAR | Status: DC | PRN

## 2015-09-02 MED ORDER — FENTANYL CITRATE (PF) 100 MCG/2ML IJ SOLN: 25.0000 ug | INTRAMUSCULAR | Status: DC | PRN

## 2015-09-02 MED ORDER — MIDAZOLAM HCL 2 MG/2ML IJ SOLN
INTRAMUSCULAR | Status: AC
  Filled 2015-09-02: qty 2

## 2015-09-02 MED ORDER — CEFAZOLIN SODIUM-DEXTROSE 2-4 GM/100ML-% IV SOLN
2.0000 g | INTRAVENOUS | Status: AC
  Administered 2015-09-02: 2 g via INTRAVENOUS
  Filled 2015-09-02: qty 100

## 2015-09-02 MED ORDER — MIDAZOLAM HCL 2 MG/2ML IJ SOLN
1.0000 mg | INTRAMUSCULAR | Status: DC | PRN
  Administered 2015-09-02 (×2): 2 mg via INTRAVENOUS
  Filled 2015-09-02: qty 2

## 2015-09-02 MED ORDER — SODIUM CHLORIDE 0.9 % IJ SOLN
INTRAMUSCULAR | Status: AC
  Filled 2015-09-02: qty 10

## 2015-09-02 MED ORDER — BUPIVACAINE HCL (PF) 0.5 % IJ SOLN
INTRAMUSCULAR | Status: DC | PRN
  Administered 2015-09-02: 30 mL

## 2015-09-02 MED ORDER — PROPOFOL 500 MG/50ML IV EMUL
INTRAVENOUS | Status: DC | PRN
  Administered 2015-09-02: 50 ug/kg/min via INTRAVENOUS

## 2015-09-02 MED ORDER — LIDOCAINE HCL (PF) 1 % IJ SOLN
INTRAMUSCULAR | Status: AC
  Filled 2015-09-02: qty 15

## 2015-09-02 MED ORDER — 0.9 % SODIUM CHLORIDE (POUR BTL) OPTIME
TOPICAL | Status: DC | PRN
  Administered 2015-09-02: 1000 mL

## 2015-09-02 MED ORDER — EPHEDRINE SULFATE 50 MG/ML IJ SOLN
INTRAMUSCULAR | Status: AC
  Filled 2015-09-02: qty 1

## 2015-09-02 MED ORDER — LIDOCAINE HCL (PF) 2 % IJ SOLN
INTRAMUSCULAR | Status: AC
  Filled 2015-09-02: qty 30

## 2015-09-02 MED ORDER — LIDOCAINE HCL (PF) 2 % IJ SOLN
INTRAMUSCULAR | Status: DC | PRN
  Administered 2015-09-02: 30 mL via INTRADERMAL

## 2015-09-02 MED ORDER — BUPIVACAINE HCL (PF) 0.5 % IJ SOLN
INTRAMUSCULAR | Status: AC
  Filled 2015-09-02: qty 30

## 2015-09-02 MED ORDER — CHLORHEXIDINE GLUCONATE 4 % EX LIQD: 1.0000 "application " | Freq: Once | CUTANEOUS | Status: DC

## 2015-09-02 MED ORDER — LIDOCAINE HCL (CARDIAC) 10 MG/ML IV SOLN
INTRAVENOUS | Status: DC | PRN
  Administered 2015-09-02: 20 mg via INTRAVENOUS

## 2015-09-02 MED ORDER — ROCURONIUM BROMIDE 50 MG/5ML IV SOLN
INTRAVENOUS | Status: AC
  Filled 2015-09-02: qty 2

## 2015-09-02 SURGICAL SUPPLY — 34 items
BAG HAMPER (MISCELLANEOUS) ×2 IMPLANT
BANDAGE ELASTIC 3 LF NS (GAUZE/BANDAGES/DRESSINGS) ×2 IMPLANT
BANDAGE ELASTIC 4 VELCRO NS (GAUZE/BANDAGES/DRESSINGS) IMPLANT
BNDG GAUZE ELAST 4 BULKY (GAUZE/BANDAGES/DRESSINGS) ×2 IMPLANT
CLOTH BEACON ORANGE TIMEOUT ST (SAFETY) ×2 IMPLANT
COVER LIGHT HANDLE STERIS (MISCELLANEOUS) ×4 IMPLANT
DRSG ADAPTIC 3X8 NADH LF (GAUZE/BANDAGES/DRESSINGS) ×2 IMPLANT
DRSG XEROFORM 1X8 (GAUZE/BANDAGES/DRESSINGS) IMPLANT
ELECT REM PT RETURN 9FT ADLT (ELECTROSURGICAL) ×2
ELECTRODE REM PT RTRN 9FT ADLT (ELECTROSURGICAL) ×1 IMPLANT
FORMALIN 10 PREFIL 120ML (MISCELLANEOUS) ×2 IMPLANT
GAUZE SPONGE 4X4 12PLY STRL (GAUZE/BANDAGES/DRESSINGS) ×2 IMPLANT
GLOVE BIOGEL PI IND STRL 7.0 (GLOVE) ×2 IMPLANT
GLOVE BIOGEL PI INDICATOR 7.0 (GLOVE) ×2
GLOVE ECLIPSE 6.5 STRL STRAW (GLOVE) ×2 IMPLANT
GLOVE SKINSENSE NS SZ6.5 (GLOVE) ×1
GLOVE SKINSENSE STRL SZ6.5 (GLOVE) ×1 IMPLANT
GLOVE SURG SS PI 7.5 STRL IVOR (GLOVE) ×2 IMPLANT
GOWN STRL REUS W/TWL LRG LVL3 (GOWN DISPOSABLE) ×6 IMPLANT
INST SET MINOR BONE (KITS) ×2 IMPLANT
KIT ROOM TURNOVER APOR (KITS) ×2 IMPLANT
MANIFOLD NEPTUNE II (INSTRUMENTS) ×2 IMPLANT
NS IRRIG 1000ML POUR BTL (IV SOLUTION) ×2 IMPLANT
PACK BASIC LIMB (CUSTOM PROCEDURE TRAY) ×2 IMPLANT
PAD ARMBOARD 7.5X6 YLW CONV (MISCELLANEOUS) ×2 IMPLANT
SET BASIN LINEN APH (SET/KITS/TRAYS/PACK) ×2 IMPLANT
SOL PREP PROV IODINE SCRUB 4OZ (MISCELLANEOUS) ×2 IMPLANT
SPONGE LAP 18X18 X RAY DECT (DISPOSABLE) ×2 IMPLANT
SUT ETHILON 3 0 FSL (SUTURE) IMPLANT
SUT ETHILON 4 0 PS 2 18 (SUTURE) ×4 IMPLANT
SUT PROLENE 2 0 FS (SUTURE) IMPLANT
SUT VIC AB 3-0 SH 27 (SUTURE) ×1
SUT VIC AB 3-0 SH 27XBRD (SUTURE) ×1 IMPLANT
SUT VIC AB 4-0 PS2 27 (SUTURE) ×2 IMPLANT

## 2015-09-02 NOTE — Op Note (Signed)
Patient:  Nathan Tyler  DOB:  1955/11/06  MRN:  [id number]   Preop Diagnosis:  Osteomyelitis, left second toe  Postop Diagnosis:  Same  Procedure:  Left second toe amputation  Surgeon:  Aviva Signs, M.D.  Anes:  Regional  Indications:  Patient is a 60 year old white male who presents with osteomyelitis of the midportion of the left second toe. He now presents for a left second toe amputation. The risks and benefits of the procedure including bleeding, infection, and wound breakdown were fully explained to the patient, who gave informed consent.  Procedure note:  The patient was placed the supine position. After regional anesthesia was given, the left foot was prepped and draped using usual sterile technique with DuraPrep. Surgical site confirmation was performed.  An elliptical incision was made along the base of the left second toe. This was taken down to the metatarsal phalangeal joint. The left second toe was then disarticulated from the left foot and removed from the operative field. It was sent to pathology for further examination. No purulent fluid was present. The wound was irrigated normal saline. A bleeding was controlled using Bovie electrocautery. The subcutaneous layer was reapproximated using a 4-0 Vicryl interrupted suture. The skin was closed using a 4-0 nylon interrupted suture. Betadine ointment and dry sterile dressing were applied.  All tape and needle counts were correct at the end of the procedure. Patient was transferred to PACU in stable condition.  Complications:  None  EBL:  Minimal  Specimen:  Left second toe

## 2015-09-02 NOTE — Anesthesia Preprocedure Evaluation (Signed)
Anesthesia Evaluation  Patient identified by MRN, date of birth, ID band Patient awake    Reviewed: Allergy & Precautions, NPO status , Patient's Chart, lab work & pertinent test results  Airway Mallampati: III  TM Distance: >3 FB Neck ROM: Full    Dental  (+) Teeth Intact, Dental Advisory Given   Pulmonary    Pulmonary exam normal        Cardiovascular Normal cardiovascular exam     Neuro/Psych  Neuromuscular disease    GI/Hepatic   Endo/Other  diabetes, Poorly Controlled, Type 2, Insulin DependentHypothyroidism   Renal/GU      Musculoskeletal   Abdominal (+) + obese,   Peds  Hematology   Anesthesia Other Findings   Reproductive/Obstetrics                             Anesthesia Physical Anesthesia Plan  ASA: III  Anesthesia Plan: Regional   Post-op Pain Management:    Induction:   Airway Management Planned: Nasal Cannula  Additional Equipment:   Intra-op Plan:   Post-operative Plan:   Informed Consent: I have reviewed the patients History and Physical, chart, labs and discussed the procedure including the risks, benefits and alternatives for the proposed anesthesia with the patient or authorized representative who has indicated his/her understanding and acceptance.     Plan Discussed with: CRNA  Anesthesia Plan Comments:         Anesthesia Quick Evaluation

## 2015-09-02 NOTE — Transfer of Care (Signed)
Immediate Anesthesia Transfer of Care Note  Patient: Nathan Tyler  Procedure(s) Performed: Procedure(s): AMPUTATION  2ND TOE LEFT FOOT (Left)  Patient Location: PACU  Anesthesia Type:Regional  Level of Consciousness: awake and patient cooperative  Airway & Oxygen Therapy: Patient Spontanous Breathing and Patient connected to nasal cannula oxygen  Post-op Assessment: Report given to RN, Post -op Vital signs reviewed and stable and Patient moving all extremities  Post vital signs: Reviewed and stable  Last Vitals:  Filed Vitals:   09/02/15 0935 09/02/15 0940  BP: 125/79 126/75  Pulse:    Temp:    Resp: 17 20    Last Pain:  Filed Vitals:   09/02/15 0943  PainSc: 2       Patients Stated Pain Goal: 9 ([id number])  Complications: No apparent anesthesia complications

## 2015-09-02 NOTE — Discharge Instructions (Signed)
Walk on heel only, left foot.  Keep left foot elevated with two pillows as much as possible.  May take dressing off Sunday and shower.

## 2015-09-02 NOTE — Interval H&P Note (Signed)
History and Physical Interval Note:  09/02/2015 9:07 AM  Nathan Tyler  has presented today for surgery, with the diagnosis of osteomyelitis left 2nd toe  The various methods of treatment have been discussed with the patient and family. After consideration of risks, benefits and other options for treatment, the patient has consented to  Procedure(s): AMPUTATION TOE - 2ND TOE (Left) as a surgical intervention .  The patient's history has been reviewed, patient examined, no change in status, stable for surgery.  I have reviewed the patient's chart and labs.  Questions were answered to the patient's satisfaction.     Aviva Signs A

## 2015-09-02 NOTE — Anesthesia Procedure Notes (Addendum)
Procedure Name: MAC Date/Time: 09/02/2015 9:45 AM Performed by: Vista Deck Pre-anesthesia Checklist: Patient identified, Emergency Drugs available, Suction available, Timeout performed and Patient being monitored Patient Re-evaluated:Patient Re-evaluated prior to inductionOxygen Delivery Method: Non-rebreather mask   Anesthesia Regional Block:  Ankle block  Pre-Anesthetic Checklist: ,, timeout performed, Correct Patient, Correct Site, Correct Laterality, Correct Procedure, Correct Position, site marked, Risks and benefits discussed,  Surgical consent,  Pre-op evaluation,  At surgeon's request and post-op pain management  Laterality: Left  Prep: chloraprep       Needles:  Injection technique: Single-shot      Needle Gauge: 25 and 25 G    Additional Needles: A 27G and a 25G needle were used to perform the ankle block.Ankle block Narrative:  Start time: 09/02/2015 9:50 AM End time: 09/02/2015 10:00 AM  Performed by: Personally  Anesthesiologist: Rochel Brome TESTA

## 2015-09-02 NOTE — Anesthesia Postprocedure Evaluation (Signed)
Anesthesia Post Note  Patient: Nathan Tyler  Procedure(s) Performed: Procedure(s) (LRB): AMPUTATION  2ND TOE LEFT FOOT (Left)  Patient location during evaluation: PACU Anesthesia Type: Regional Level of consciousness: awake Pain management: pain level controlled Vital Signs Assessment: post-procedure vital signs reviewed and stable Respiratory status: spontaneous breathing Cardiovascular status: stable Anesthetic complications: no    Last Vitals:  Filed Vitals:   09/02/15 1130 09/02/15 1206  BP: 117/69 139/84  Pulse: 62 80  Temp: 36.6 C 36.4 C  Resp: 16 16    Last Pain:  Filed Vitals:   09/02/15 1207  PainSc: 0-No pain                 Sheriann Newmann

## 2015-09-05 ENCOUNTER — Encounter (HOSPITAL_COMMUNITY): Payer: Self-pay | Admitting: General Surgery

## 2015-10-04 DIAGNOSIS — N529 Male erectile dysfunction, unspecified: Secondary | ICD-10-CM | POA: Diagnosis not present

## 2015-10-04 DIAGNOSIS — G255 Other chorea: Secondary | ICD-10-CM | POA: Diagnosis not present

## 2015-10-04 DIAGNOSIS — E119 Type 2 diabetes mellitus without complications: Secondary | ICD-10-CM | POA: Diagnosis not present

## 2015-10-28 DIAGNOSIS — L851 Acquired keratosis [keratoderma] palmaris et plantaris: Secondary | ICD-10-CM | POA: Diagnosis not present

## 2015-10-28 DIAGNOSIS — E1142 Type 2 diabetes mellitus with diabetic polyneuropathy: Secondary | ICD-10-CM | POA: Diagnosis not present

## 2015-11-07 DIAGNOSIS — E039 Hypothyroidism, unspecified: Secondary | ICD-10-CM | POA: Diagnosis not present

## 2015-11-07 DIAGNOSIS — E119 Type 2 diabetes mellitus without complications: Secondary | ICD-10-CM | POA: Diagnosis not present

## 2015-11-07 DIAGNOSIS — N529 Male erectile dysfunction, unspecified: Secondary | ICD-10-CM | POA: Diagnosis not present

## 2015-11-15 DIAGNOSIS — G255 Other chorea: Secondary | ICD-10-CM | POA: Diagnosis not present

## 2015-11-15 DIAGNOSIS — Z6826 Body mass index (BMI) 26.0-26.9, adult: Secondary | ICD-10-CM | POA: Diagnosis not present

## 2015-12-16 ENCOUNTER — Ambulatory Visit (INDEPENDENT_AMBULATORY_CARE_PROVIDER_SITE_OTHER): Payer: Medicare Other | Admitting: Urology

## 2015-12-16 DIAGNOSIS — Z23 Encounter for immunization: Secondary | ICD-10-CM | POA: Diagnosis not present

## 2015-12-16 DIAGNOSIS — N5201 Erectile dysfunction due to arterial insufficiency: Secondary | ICD-10-CM

## 2015-12-23 ENCOUNTER — Ambulatory Visit (INDEPENDENT_AMBULATORY_CARE_PROVIDER_SITE_OTHER): Payer: Medicare Other | Admitting: Urology

## 2015-12-23 DIAGNOSIS — N5201 Erectile dysfunction due to arterial insufficiency: Secondary | ICD-10-CM

## 2016-01-23 DIAGNOSIS — N529 Male erectile dysfunction, unspecified: Secondary | ICD-10-CM | POA: Diagnosis not present

## 2016-01-23 DIAGNOSIS — R7301 Impaired fasting glucose: Secondary | ICD-10-CM | POA: Diagnosis not present

## 2016-01-23 DIAGNOSIS — E119 Type 2 diabetes mellitus without complications: Secondary | ICD-10-CM | POA: Diagnosis not present

## 2016-01-23 DIAGNOSIS — E785 Hyperlipidemia, unspecified: Secondary | ICD-10-CM | POA: Diagnosis not present

## 2016-01-23 DIAGNOSIS — E039 Hypothyroidism, unspecified: Secondary | ICD-10-CM | POA: Diagnosis not present

## 2016-01-23 DIAGNOSIS — E782 Mixed hyperlipidemia: Secondary | ICD-10-CM | POA: Diagnosis not present

## 2016-01-23 DIAGNOSIS — I482 Chronic atrial fibrillation: Secondary | ICD-10-CM | POA: Diagnosis not present

## 2016-01-23 DIAGNOSIS — I10 Essential (primary) hypertension: Secondary | ICD-10-CM | POA: Diagnosis not present

## 2016-01-24 DIAGNOSIS — E1142 Type 2 diabetes mellitus with diabetic polyneuropathy: Secondary | ICD-10-CM | POA: Diagnosis not present

## 2016-01-24 DIAGNOSIS — L851 Acquired keratosis [keratoderma] palmaris et plantaris: Secondary | ICD-10-CM | POA: Diagnosis not present

## 2016-01-24 DIAGNOSIS — B351 Tinea unguium: Secondary | ICD-10-CM | POA: Diagnosis not present

## 2016-01-25 DIAGNOSIS — E039 Hypothyroidism, unspecified: Secondary | ICD-10-CM | POA: Diagnosis not present

## 2016-01-25 DIAGNOSIS — E119 Type 2 diabetes mellitus without complications: Secondary | ICD-10-CM | POA: Diagnosis not present

## 2016-01-25 DIAGNOSIS — Z940 Kidney transplant status: Secondary | ICD-10-CM | POA: Diagnosis not present

## 2016-01-25 DIAGNOSIS — Z6826 Body mass index (BMI) 26.0-26.9, adult: Secondary | ICD-10-CM | POA: Diagnosis not present

## 2016-01-25 DIAGNOSIS — N529 Male erectile dysfunction, unspecified: Secondary | ICD-10-CM | POA: Diagnosis not present

## 2016-02-27 NOTE — Telephone Encounter (Signed)
error 

## 2016-03-06 DIAGNOSIS — E1129 Type 2 diabetes mellitus with other diabetic kidney complication: Secondary | ICD-10-CM | POA: Diagnosis not present

## 2016-03-06 DIAGNOSIS — Z940 Kidney transplant status: Secondary | ICD-10-CM | POA: Diagnosis not present

## 2016-03-06 DIAGNOSIS — D638 Anemia in other chronic diseases classified elsewhere: Secondary | ICD-10-CM | POA: Diagnosis not present

## 2016-03-09 ENCOUNTER — Ambulatory Visit (INDEPENDENT_AMBULATORY_CARE_PROVIDER_SITE_OTHER): Payer: Medicare Other | Admitting: Urology

## 2016-03-09 DIAGNOSIS — N5201 Erectile dysfunction due to arterial insufficiency: Secondary | ICD-10-CM | POA: Diagnosis not present

## 2016-03-09 DIAGNOSIS — R81 Glycosuria: Secondary | ICD-10-CM | POA: Diagnosis not present

## 2016-04-03 DIAGNOSIS — L851 Acquired keratosis [keratoderma] palmaris et plantaris: Secondary | ICD-10-CM | POA: Diagnosis not present

## 2016-04-03 DIAGNOSIS — E1142 Type 2 diabetes mellitus with diabetic polyneuropathy: Secondary | ICD-10-CM | POA: Diagnosis not present

## 2016-04-03 DIAGNOSIS — B351 Tinea unguium: Secondary | ICD-10-CM | POA: Diagnosis not present

## 2016-04-18 DIAGNOSIS — E039 Hypothyroidism, unspecified: Secondary | ICD-10-CM | POA: Diagnosis not present

## 2016-04-18 DIAGNOSIS — E782 Mixed hyperlipidemia: Secondary | ICD-10-CM | POA: Diagnosis not present

## 2016-04-18 DIAGNOSIS — E119 Type 2 diabetes mellitus without complications: Secondary | ICD-10-CM | POA: Diagnosis not present

## 2016-04-25 DIAGNOSIS — Z6826 Body mass index (BMI) 26.0-26.9, adult: Secondary | ICD-10-CM | POA: Diagnosis not present

## 2016-04-25 DIAGNOSIS — E782 Mixed hyperlipidemia: Secondary | ICD-10-CM | POA: Diagnosis not present

## 2016-04-25 DIAGNOSIS — N529 Male erectile dysfunction, unspecified: Secondary | ICD-10-CM | POA: Diagnosis not present

## 2016-04-25 DIAGNOSIS — Z940 Kidney transplant status: Secondary | ICD-10-CM | POA: Diagnosis not present

## 2016-04-25 DIAGNOSIS — E039 Hypothyroidism, unspecified: Secondary | ICD-10-CM | POA: Diagnosis not present

## 2016-04-25 DIAGNOSIS — E1142 Type 2 diabetes mellitus with diabetic polyneuropathy: Secondary | ICD-10-CM | POA: Diagnosis not present

## 2016-05-01 DIAGNOSIS — L03031 Cellulitis of right toe: Secondary | ICD-10-CM | POA: Diagnosis not present

## 2016-05-01 DIAGNOSIS — Z6825 Body mass index (BMI) 25.0-25.9, adult: Secondary | ICD-10-CM | POA: Diagnosis not present

## 2016-05-07 DIAGNOSIS — L03031 Cellulitis of right toe: Secondary | ICD-10-CM | POA: Diagnosis not present

## 2016-05-07 DIAGNOSIS — Z6825 Body mass index (BMI) 25.0-25.9, adult: Secondary | ICD-10-CM | POA: Diagnosis not present

## 2016-05-07 DIAGNOSIS — Z940 Kidney transplant status: Secondary | ICD-10-CM | POA: Diagnosis not present

## 2016-05-07 DIAGNOSIS — D638 Anemia in other chronic diseases classified elsewhere: Secondary | ICD-10-CM | POA: Diagnosis not present

## 2016-06-08 ENCOUNTER — Ambulatory Visit (INDEPENDENT_AMBULATORY_CARE_PROVIDER_SITE_OTHER): Payer: Medicare Other | Admitting: Urology

## 2016-06-08 DIAGNOSIS — N5201 Erectile dysfunction due to arterial insufficiency: Secondary | ICD-10-CM

## 2016-06-12 ENCOUNTER — Other Ambulatory Visit (HOSPITAL_COMMUNITY): Payer: Self-pay | Admitting: Podiatry

## 2016-06-12 DIAGNOSIS — M869 Osteomyelitis, unspecified: Secondary | ICD-10-CM

## 2016-06-12 DIAGNOSIS — L97512 Non-pressure chronic ulcer of other part of right foot with fat layer exposed: Secondary | ICD-10-CM | POA: Diagnosis not present

## 2016-06-12 DIAGNOSIS — L851 Acquired keratosis [keratoderma] palmaris et plantaris: Secondary | ICD-10-CM | POA: Diagnosis not present

## 2016-06-12 DIAGNOSIS — E1142 Type 2 diabetes mellitus with diabetic polyneuropathy: Secondary | ICD-10-CM | POA: Diagnosis not present

## 2016-06-12 DIAGNOSIS — B351 Tinea unguium: Secondary | ICD-10-CM | POA: Diagnosis not present

## 2016-06-14 ENCOUNTER — Ambulatory Visit (HOSPITAL_COMMUNITY)
Admission: RE | Admit: 2016-06-14 | Discharge: 2016-06-14 | Disposition: A | Discharge status: Home / Self Care | Payer: Medicare Other | Source: Ambulatory Visit | Attending: Podiatry | Admitting: Podiatry

## 2016-06-14 DIAGNOSIS — M868X7 Other osteomyelitis, ankle and foot: Secondary | ICD-10-CM | POA: Insufficient documentation

## 2016-06-14 DIAGNOSIS — M86171 Other acute osteomyelitis, right ankle and foot: Secondary | ICD-10-CM | POA: Diagnosis not present

## 2016-06-14 DIAGNOSIS — M869 Osteomyelitis, unspecified: Secondary | ICD-10-CM

## 2016-06-15 ENCOUNTER — Ambulatory Visit (HOSPITAL_COMMUNITY): Payer: PRIVATE HEALTH INSURANCE

## 2016-06-19 DIAGNOSIS — M869 Osteomyelitis, unspecified: Secondary | ICD-10-CM | POA: Diagnosis not present

## 2016-06-19 DIAGNOSIS — E1142 Type 2 diabetes mellitus with diabetic polyneuropathy: Secondary | ICD-10-CM | POA: Diagnosis not present

## 2016-06-22 NOTE — Patient Instructions (Signed)
Nathan Tyler  06/22/2016     @PREFPERIOPPHARMACY @   Your procedure is scheduled on  06/27/2016  Report to [name] at  65  A.M.  Call this number if you have problems the morning of surgery:  [phone]   Remember:  Do not eat food or drink liquids after midnight.  Take these medicines the morning of surgery with A SIP OF WATER  Levothyroxine, lisinopril, prograf.   Do not wear jewelry, make-up or nail polish.  Do not wear lotions, powders, or perfumes, or deoderant.  Do not shave 48 hours prior to surgery.  Men may shave face and neck.  Do not bring valuables to the hospital.  [facility] is not responsible for any belongings or valuables.  Contacts, dentures or bridgework may not be worn into surgery.  Leave your suitcase in the car.  After surgery it may be brought to your room.  For patients admitted to the hospital, discharge time will be determined by your treatment team.  Patients discharged the day of surgery will not be allowed to drive home.   Name and phone number of your driver:   family Special instructions:  None  Please read over the following fact sheets that you were given. Anesthesia Post-op Instructions and Care and Recovery After Surgery      Toe Amputation Toe amputation is a surgical procedure to remove all or part of a toe. You may have this procedure if:  Tissue in your toe is dying because of poor blood supply.  You have a severe infection in your toe. Removing your toe keeps nearby tissue healthy. If the toe is infected, removing it helps to keep the infection from spreading. Tell a health care provider about:  Any allergies you have.  All medicines you are taking, including vitamins, herbs, eye drops, creams, and over-the-counter medicines.  Any problems you or family members have had with anesthetic medicines.  Any blood disorders you have.  Any surgeries you have had.  Any medical conditions you have.  Whether you are  pregnant or may be pregnant. What are the risks? Generally, this is a safe procedure. However, problems may occur, including:  Bleeding.  Buildup of blood and fluid (hematoma).  Infection.  Allergic reactions to medicines.  Tissue death in the flap of skin (flap necrosis).  Trouble with healing.  Minor changes in the way that you walk (your gait).  Feeling pain in the area that was removed (phantom pain). This is rare. What happens before the procedure?  Follow instructions from your health care provider about eating or drinking restrictions.  Ask your health care provider about:  Changing or stopping your regular medicines. This is especially important if you are taking diabetes medicines or blood thinners.  Taking medicines such as aspirin and ibuprofen. These medicines can thin your blood. Do not take these medicines before your procedure if your health care provider instructs you not to.  You may be given antibiotic medicine to help prevent infection.  Plan to have someone take you home after the procedure.  If you will be going home right after the procedure, plan to have someone with you for 24 hours. What happens during the procedure?  You will be given one or more of the following:  A medicine to help you relax (sedative).  A medicine to numb the area (local anesthetic).  A medicine to make you fall asleep (general anesthetic).  A medicine that  is injected into your spine to numb the area below and slightly above the injection site (spinal anesthetic).  A medicine that is injected into an area of your body to numb everything below the injection site (regional anesthetic).  To reduce your risk of infection:  Your health care team will wash or sanitize their hands.  Your skin will be washed with soap.  Your surgeon will mark the area of your toe for removal.  Your surgeon will make a surgical cut (incision) in your toe.  The dead tissue and bone will be  removed.  Nerves and vessels will be tied or heated with a special tool to stop bleeding.  The area will be drained and cleaned.  If only part of a toe is removed, the remaining part will be covered with a flap of skin.  The incision will be treated in one of these ways:  It will be closed with stitches (sutures).  It will be left open to heal if there is an infection.  The incision area may be packed with gauze and covered with bandages (dressings).  Tissue samples may be sent to a lab to be examined under a microscope. The procedure may vary among health care providers and hospitals. What happens after the procedure?  Your blood pressure, heart rate, breathing rate, and blood oxygen level will be monitored often until the medicines you were given have worn off.  Your foot will be raised up high (elevated) to relieve swelling.  You will be monitored for pain.  You will be given pain medicines and antibiotics.  Your health care provider or physical therapist will help you to move around as soon as possible.  Do not drive for 24 hours if you received a sedative. This information is not intended to replace advice given to you by your health care provider. Make sure you discuss any questions you have with your health care provider. Document Released: 03/21/2015 Document Revised: 12/12/2015 Document Reviewed: 01/01/2015 Elsevier Interactive Patient Education  2017 [place].  Toe Amputation, Care After Refer to this sheet in the next few weeks. These instructions provide you with information on caring for yourself after your procedure. Your health care provider may also give you more specific instructions. Your treatment has been planned according to current medical practices, but problems sometimes occur. Call your health care provider if you have any problems or questions after your procedure. What can I expect after the procedure? After your procedure, it is common to have  some pain. Pain usually improves within a week. Follow these instructions at home: Medicines   Take your antibiotic medicine as told by your health care provider. Do not stop taking the antibiotic even if you start to feel better.  Take over-the-counter and prescription medicines only as told by your health care provider. Managing pain, stiffness, and swelling    If directed, apply ice to the surgical area:  Put ice in a plastic bag.  Place a towel between your skin and the bag.  Leave the ice on for 20 minutes, 2-3 times a day.  Raise (elevate) your foot so it is above the level of your heart. This helps to reduce swelling.  Try to walk each day. Incision care    Follow instructions from your health care provider about how to take care of your cut from surgery (incision). Make sure you:  Wash your hands with soap and water before you change your bandage (dressing). If soap and water are  not available, use hand sanitizer.  Change your dressing as told by your health care provider.  If you got stitches (sutures), leave them in place. They may need to stay in place for 2 weeks or longer.  Check your incision area every day for signs of infection. Check for:  More redness, swelling, or pain.  More fluid or blood.  Warmth.  Pus or a bad smell. Bathing   Do not take baths, swim, use a hot tub, or soak your foot until your health care provider approves.  You may shower unless you were told not to. When you shower, keep your dressing dry.  If your dressing has been removed, you may wash your skin with warm water and soap. Driving   Do not drive for 24 hours if you received a sedative.  Do not drive or operate heavy machinery while taking prescription pain medicine. Activity   Do exercises as told by your health care provider.  Return to your normal activities as told by your health care provider. Ask your health care provider what activities are safe for  you. General instructions   Do not use any tobacco products, such as cigarettes, chewing tobacco, and e-cigarettes. If you need help quitting, ask your health care provider.  Ask your health care provider about wearing special shoes or using inserts to support your foot.  If you have diabetes, keep your blood sugar under control.  Keep all follow-up visits as told by your health care provider. This is important. Contact a health care provider if:  You have more redness around your incision.  You have more fluid or blood coming from your incision.  Your incision feels warm to the touch.  You have pus or a bad smell coming from your incision.  You have a fever.  Your dressing is soaked with blood.  Your sutures tear or they separate.  You have numbness or tingling in your toes or foot.  Your foot is cool or pale, or it changes color.  Your pain does not improve after you take your medicine. Get help right away if:  You have pain or swelling that gets worse or does not go away.  You have red streaks on your skin near your toes, foot, or leg.  You have pain in your calf or behind your knee.  You have shortness of breath.  You have chest pain. This information is not intended to replace advice given to you by your health care provider. Make sure you discuss any questions you have with your health care provider. Document Released: 03/21/2015 Document Revised: 12/12/2015 Document Reviewed: 01/01/2015 Elsevier Interactive Patient Education  2017 [employer] PATIENT INSTRUCTIONS POST-ANESTHESIA  IMMEDIATELY FOLLOWING SURGERY:  Do not drive or operate machinery for the first twenty four hours after surgery.  Do not make any important decisions for twenty four hours after surgery or while taking narcotic pain medications or sedatives.  If you develop intractable nausea and vomiting or a severe headache please notify your doctor immediately.  FOLLOW-UP:  Please make an  appointment with your surgeon as instructed. You do not need to follow up with anesthesia unless specifically instructed to do so.  WOUND CARE INSTRUCTIONS (if applicable):  Keep a dry clean dressing on the anesthesia/puncture wound site if there is drainage.  Once the wound has quit draining you may leave it open to air.  Generally you should leave the bandage intact for twenty four hours unless there is drainage.  If the  epidural site drains for more than 36-48 hours please call the anesthesia department.  QUESTIONS?:  Please feel free to call your physician or the hospital operator if you have any questions, and they will be happy to assist you.

## 2016-06-25 ENCOUNTER — Other Ambulatory Visit: Payer: Self-pay | Admitting: *Deleted

## 2016-06-26 ENCOUNTER — Other Ambulatory Visit (HOSPITAL_COMMUNITY): Payer: Self-pay | Admitting: Podiatry

## 2016-06-26 ENCOUNTER — Ambulatory Visit (HOSPITAL_COMMUNITY)
Admission: RE | Admit: 2016-06-26 | Discharge: 2016-06-26 | Disposition: A | Discharge status: Home / Self Care | Payer: Medicare Other | Source: Ambulatory Visit | Attending: Podiatry | Admitting: Podiatry

## 2016-06-26 ENCOUNTER — Encounter (HOSPITAL_COMMUNITY)
Admission: RE | Admit: 2016-06-26 | Discharge: 2016-06-26 | Disposition: A | Discharge status: Home / Self Care | Payer: Medicare Other | Source: Ambulatory Visit | Attending: Podiatry | Admitting: Podiatry

## 2016-06-26 ENCOUNTER — Encounter (HOSPITAL_COMMUNITY): Payer: Self-pay

## 2016-06-26 DIAGNOSIS — E1342 Other specified diabetes mellitus with diabetic polyneuropathy: Secondary | ICD-10-CM | POA: Insufficient documentation

## 2016-06-26 DIAGNOSIS — E13621 Other specified diabetes mellitus with foot ulcer: Secondary | ICD-10-CM | POA: Insufficient documentation

## 2016-06-26 DIAGNOSIS — L97518 Non-pressure chronic ulcer of other part of right foot with other specified severity: Secondary | ICD-10-CM | POA: Insufficient documentation

## 2016-06-26 DIAGNOSIS — L97519 Non-pressure chronic ulcer of other part of right foot with unspecified severity: Secondary | ICD-10-CM | POA: Diagnosis not present

## 2016-06-26 HISTORY — DX: Other specified postprocedural states: Z98.890

## 2016-06-26 HISTORY — DX: Nausea with vomiting, unspecified: R11.2

## 2016-06-26 HISTORY — DX: Pure hypercholesterolemia, unspecified: E78.00

## 2016-06-26 LAB — CBC WITH DIFFERENTIAL/PLATELET
BASOS ABS: 0 10*3/uL (ref 0.0–0.1)
Basophils Relative: 0 %
EOS ABS: 0.2 10*3/uL (ref 0.0–0.7)
EOS PCT: 3 %
HCT: 39.4 % (ref 39.0–52.0)
Hemoglobin: 13.1 g/dL (ref 13.0–17.0)
LYMPHS ABS: 2.4 10*3/uL (ref 0.7–4.0)
Lymphocytes Relative: 35 %
MCH: 31.8 pg (ref 26.0–34.0)
MCHC: 33.2 g/dL (ref 30.0–36.0)
MCV: 95.6 fL (ref 78.0–100.0)
MONO ABS: 0.5 10*3/uL (ref 0.1–1.0)
Monocytes Relative: 7 %
Neutro Abs: 3.7 10*3/uL (ref 1.7–7.7)
Neutrophils Relative %: 55 %
PLATELETS: 261 10*3/uL (ref 150–400)
RBC: 4.12 MIL/uL — AB (ref 4.22–5.81)
RDW: 13.2 % (ref 11.5–15.5)
WBC: 6.8 10*3/uL (ref 4.0–10.5)

## 2016-06-26 LAB — BASIC METABOLIC PANEL
Anion gap: 5 (ref 5–15)
BUN: 35 mg/dL — AB (ref 6–20)
CO2: 24 mmol/L (ref 22–32)
Calcium: 10.3 mg/dL (ref 8.9–10.3)
Chloride: 108 mmol/L (ref 101–111)
Creatinine, Ser: 1.11 mg/dL (ref 0.61–1.24)
GFR calc Af Amer: >60 mL/min (ref >60)
Glucose, Bld: 122 mg/dL — ABNORMAL HIGH (ref 65–99)
POTASSIUM: 4.6 mmol/L (ref 3.5–5.1)
SODIUM: 137 mmol/L (ref 135–145)

## 2016-06-27 ENCOUNTER — Ambulatory Visit (HOSPITAL_COMMUNITY): Payer: Medicare Other | Admitting: Anesthesiology

## 2016-06-27 ENCOUNTER — Encounter (HOSPITAL_COMMUNITY)
Admission: RE | Disposition: A | Discharge status: Home / Self Care | Payer: Self-pay | Source: Ambulatory Visit | Attending: Podiatry

## 2016-06-27 ENCOUNTER — Ambulatory Visit (HOSPITAL_COMMUNITY): Payer: Medicare Other

## 2016-06-27 ENCOUNTER — Encounter (HOSPITAL_COMMUNITY): Payer: Self-pay | Admitting: *Deleted

## 2016-06-27 ENCOUNTER — Ambulatory Visit (HOSPITAL_COMMUNITY)
Admission: RE | Admit: 2016-06-27 | Discharge: 2016-06-27 | Disposition: A | Discharge status: Home / Self Care | Payer: Medicare Other | Source: Ambulatory Visit | Attending: Podiatry | Admitting: Podiatry

## 2016-06-27 DIAGNOSIS — E11621 Type 2 diabetes mellitus with foot ulcer: Secondary | ICD-10-CM | POA: Diagnosis not present

## 2016-06-27 DIAGNOSIS — Z9889 Other specified postprocedural states: Secondary | ICD-10-CM

## 2016-06-27 DIAGNOSIS — M868X7 Other osteomyelitis, ankle and foot: Secondary | ICD-10-CM | POA: Insufficient documentation

## 2016-06-27 DIAGNOSIS — M869 Osteomyelitis, unspecified: Secondary | ICD-10-CM

## 2016-06-27 DIAGNOSIS — L97512 Non-pressure chronic ulcer of other part of right foot with fat layer exposed: Secondary | ICD-10-CM | POA: Diagnosis not present

## 2016-06-27 DIAGNOSIS — E1142 Type 2 diabetes mellitus with diabetic polyneuropathy: Secondary | ICD-10-CM | POA: Diagnosis not present

## 2016-06-27 DIAGNOSIS — E1169 Type 2 diabetes mellitus with other specified complication: Secondary | ICD-10-CM | POA: Diagnosis not present

## 2016-06-27 DIAGNOSIS — L97519 Non-pressure chronic ulcer of other part of right foot with unspecified severity: Secondary | ICD-10-CM | POA: Diagnosis not present

## 2016-06-27 HISTORY — PX: AMPUTATION: SHX166

## 2016-06-27 LAB — GLUCOSE, CAPILLARY
GLUCOSE-CAPILLARY: 117 mg/dL — AB (ref 65–99)
Glucose-Capillary: 122 mg/dL — ABNORMAL HIGH (ref 65–99)

## 2016-06-27 SURGERY — AMPUTATION DIGIT
Anesthesia: Monitor Anesthesia Care | Site: Toe | Laterality: Right

## 2016-06-27 MED ORDER — PROPOFOL 10 MG/ML IV BOLUS
INTRAVENOUS | Status: AC
Start: 2016-06-27 — End: 2016-06-27
  Filled 2016-06-27: qty 40

## 2016-06-27 MED ORDER — PROPOFOL 500 MG/50ML IV EMUL
INTRAVENOUS | Status: DC | PRN
  Administered 2016-06-27: 150 ug/kg/min via INTRAVENOUS

## 2016-06-27 MED ORDER — CHLORHEXIDINE GLUCONATE CLOTH 2 % EX PADS: 6.0000 | MEDICATED_PAD | Freq: Once | CUTANEOUS | Status: DC

## 2016-06-27 MED ORDER — FENTANYL CITRATE (PF) 100 MCG/2ML IJ SOLN
25.0000 ug | INTRAMUSCULAR | Status: AC
  Administered 2016-06-27 (×2): 25 ug via INTRAVENOUS

## 2016-06-27 MED ORDER — FENTANYL CITRATE (PF) 100 MCG/2ML IJ SOLN: 25.0000 ug | INTRAMUSCULAR | Status: DC | PRN

## 2016-06-27 MED ORDER — FENTANYL CITRATE (PF) 100 MCG/2ML IJ SOLN
INTRAMUSCULAR | Status: AC
  Filled 2016-06-27: qty 2

## 2016-06-27 MED ORDER — 0.9 % SODIUM CHLORIDE (POUR BTL) OPTIME
TOPICAL | Status: DC | PRN
  Administered 2016-06-27: 1000 mL

## 2016-06-27 MED ORDER — CLINDAMYCIN PHOSPHATE 600 MG/50ML IV SOLN
600.0000 mg | Freq: Once | INTRAVENOUS | Status: AC
  Administered 2016-06-27: 600 mg via INTRAVENOUS
  Filled 2016-06-27: qty 50

## 2016-06-27 MED ORDER — MIDAZOLAM HCL 5 MG/5ML IJ SOLN
INTRAMUSCULAR | Status: DC | PRN
  Administered 2016-06-27 (×2): 1 mg via INTRAVENOUS

## 2016-06-27 MED ORDER — MIDAZOLAM HCL 2 MG/2ML IJ SOLN
INTRAMUSCULAR | Status: AC
  Filled 2016-06-27: qty 2

## 2016-06-27 MED ORDER — SUCCINYLCHOLINE CHLORIDE 20 MG/ML IJ SOLN
INTRAMUSCULAR | Status: AC
  Filled 2016-06-27: qty 1

## 2016-06-27 MED ORDER — MIDAZOLAM HCL 2 MG/2ML IJ SOLN
1.0000 mg | INTRAMUSCULAR | Status: AC
  Administered 2016-06-27: 2 mg via INTRAVENOUS

## 2016-06-27 MED ORDER — BUPIVACAINE HCL (PF) 0.5 % IJ SOLN
INTRAMUSCULAR | Status: AC
  Filled 2016-06-27: qty 30

## 2016-06-27 MED ORDER — LACTATED RINGERS IV SOLN
INTRAVENOUS | Status: DC
  Administered 2016-06-27: 07:00:00 via INTRAVENOUS

## 2016-06-27 MED ORDER — BUPIVACAINE HCL (PF) 0.5 % IJ SOLN
INTRAMUSCULAR | Status: DC | PRN
  Administered 2016-06-27: 7 mL

## 2016-06-27 SURGICAL SUPPLY — 39 items
BAG HAMPER (MISCELLANEOUS) ×2 IMPLANT
BANDAGE ELASTIC 4 LF NS (GAUZE/BANDAGES/DRESSINGS) ×2 IMPLANT
BANDAGE ESMARK 4X12 BL STRL LF (DISPOSABLE) ×1 IMPLANT
BENZOIN TINCTURE PRP APPL 2/3 (GAUZE/BANDAGES/DRESSINGS) ×2 IMPLANT
BLADE SURG 15 STRL LF DISP TIS (BLADE) ×1 IMPLANT
BLADE SURG 15 STRL SS (BLADE) ×1
BNDG CONFORM 2 STRL LF (GAUZE/BANDAGES/DRESSINGS) ×2 IMPLANT
BNDG ESMARK 4X12 BLUE STRL LF (DISPOSABLE) ×2
BNDG GAUZE ELAST 4 BULKY (GAUZE/BANDAGES/DRESSINGS) ×2 IMPLANT
CLOTH BEACON ORANGE TIMEOUT ST (SAFETY) ×2 IMPLANT
COVER LIGHT HANDLE STERIS (MISCELLANEOUS) ×4 IMPLANT
CUFF TOURNIQUET SINGLE 18IN (TOURNIQUET CUFF) ×2 IMPLANT
DECANTER SPIKE VIAL GLASS SM (MISCELLANEOUS) ×2 IMPLANT
DRSG ADAPTIC 3X8 NADH LF (GAUZE/BANDAGES/DRESSINGS) ×2 IMPLANT
ELECT REM PT RETURN 9FT ADLT (ELECTROSURGICAL) ×2
ELECTRODE REM PT RTRN 9FT ADLT (ELECTROSURGICAL) ×1 IMPLANT
GAUZE SPONGE 4X4 12PLY STRL (GAUZE/BANDAGES/DRESSINGS) ×2 IMPLANT
GLOVE BIO SURGEON STRL SZ7.5 (GLOVE) ×2 IMPLANT
GLOVE BIOGEL PI IND STRL 7.0 (GLOVE) ×1 IMPLANT
GLOVE BIOGEL PI INDICATOR 7.0 (GLOVE) ×1
GLOVE ECLIPSE 7.0 STRL STRAW (GLOVE) ×2 IMPLANT
GLOVE EXAM NITRILE MD LF STRL (GLOVE) ×2 IMPLANT
GOWN STRL REUS W/ TWL XL LVL3 (GOWN DISPOSABLE) ×1 IMPLANT
GOWN STRL REUS W/TWL LRG LVL3 (GOWN DISPOSABLE) ×4 IMPLANT
GOWN STRL REUS W/TWL XL LVL3 (GOWN DISPOSABLE) ×1
KIT ROOM TURNOVER APOR (KITS) ×2 IMPLANT
MANIFOLD NEPTUNE II (INSTRUMENTS) ×2 IMPLANT
NEEDLE HYPO 27GX1-1/4 (NEEDLE) ×4 IMPLANT
NS IRRIG 1000ML POUR BTL (IV SOLUTION) ×2 IMPLANT
PACK BASIC LIMB (CUSTOM PROCEDURE TRAY) ×2 IMPLANT
PAD ABD 5X9 TENDERSORB (GAUZE/BANDAGES/DRESSINGS) ×2 IMPLANT
PAD ARMBOARD 7.5X6 YLW CONV (MISCELLANEOUS) ×2 IMPLANT
SET BASIN LINEN APH (SET/KITS/TRAYS/PACK) ×2 IMPLANT
SOL PREP PROV IODINE SCRUB 4OZ (MISCELLANEOUS) ×2 IMPLANT
SPONGE LAP 18X18 X RAY DECT (DISPOSABLE) ×2 IMPLANT
STRIP CLOSURE SKIN 1/2X4 (GAUZE/BANDAGES/DRESSINGS) ×2 IMPLANT
SUT PROLENE 4 0 PS 2 18 (SUTURE) ×4 IMPLANT
SUT VIC AB 4-0 PS2 27 (SUTURE) ×2 IMPLANT
SYR CONTROL 10ML LL (SYRINGE) ×4 IMPLANT

## 2016-06-27 NOTE — Anesthesia Procedure Notes (Signed)
Procedure Name: MAC Date/Time: 06/27/2016 7:24 AM Performed by: Andree Elk, Dayan Desa A Pre-anesthesia Checklist: Patient identified, Timeout performed, Emergency Drugs available, Suction available and Patient being monitored Oxygen Delivery Method: Simple face mask

## 2016-06-27 NOTE — Discharge Instructions (Signed)

## 2016-06-27 NOTE — Op Note (Signed)
OPERATIVE NOTE  DATE OF PROCEDURE 06/27/2016  SURGEON Marcheta Grammes, DPM  ASSISTANT SURGEON Jilda Panda, DPM  ASSISTANT SURGEON: None  OR STAFF Circulator: Towanda Malkin, RN Scrub Person: Meade Maw Circulator Assistant: Darreld Mclean, RN   PREOPERATIVE DIAGNOSIS 1.  Osteomyelitis of the 5th toe, right foot 2.  Ulceration of the 5th toe, right foot 3.  Diabetes mellitus with peripheral neuropahty  POSTOPERATIVE DIAGNOSIS Same  PROCEDURE Amputation of the 5th toe, right foot at MTPJ  ANESTHESIA Monitor Anesthesia Care   HEMOSTASIS Pneumatic ankle tourniquet set at 250 mmHg  ESTIMATED BLOOD LOSS Minimal (<5 cc)  MATERIALS USED None  INJECTABLES 0.5% Marcaine plain  PATHOLOGY 5th toe, right foot  COMPLICATIONS None  INDICATIONS:  Ulceration of the right foot with MRI findings consistent with osteomyelitis in patient with renal transplant.  DESCRIPTION OF THE PROCEDURE:  The patient was brought to the operating room and placed on the operative table in the supine position.  A pneumatic ankle tourniquet was applied to the operative extremity.  Following sedation, the surgical site was anesthetized with 0.5% Marcaine plain.  The foot was then prepped, scrubbed, and draped in the usual sterile technique.  The foot was elevated, exsanguinated and the pneumatic ankle tourniquet inflated to 250 mmHg.    Attention was directed to the right fifth toe.  2 converging semielliptical incisions were made encompassing the toe circumferentially.  Dissection was continued deep down to the level of the fifth MPJ.  The fifth MPJ was disarticulated.  The toe was freed of all soft tissue attachments, removed and passed from the operative field.  The toe was sent to pathology for evaluation.  The wound was irrigated with copious amounts sterile irrigant.  The subcutaneous structures reapproximated using 4-0 Vicryl.  The skin was reapproximated using 4-0 Prolene.   Steri-Strips were applied to reinforce wound closure.  Sterile compressive dressing was applied to the right foot.  The pneumatic ankle tourniquet was deflated and a prompt hyperemic response was noted to all digits of the operative foot.   The patient tolerated the procedure well.  The patient was then transferred to PACU with vital signs stable and vascular status intact to all toes of the operative foot.

## 2016-06-27 NOTE — Transfer of Care (Signed)
Immediate Anesthesia Transfer of Care Note  Patient: Nathan Tyler  Procedure(s) Performed: Procedure(s): AMPUTATION 5TH TOE RIGHT FOOT (Right)  Patient Location: PACU  Anesthesia Type:MAC  Level of Consciousness: awake, alert , oriented and patient cooperative  Airway & Oxygen Therapy: Patient Spontanous Breathing and Patient connected to nasal cannula oxygen  Post-op Assessment: Report given to RN and Post -op Vital signs reviewed and stable  Post vital signs: Reviewed and stable  Last Vitals:  Vitals:   06/27/16 0700 06/27/16 0718  BP:  120/82  Pulse:    Resp: (!) 37 (!) 35  Temp:      Last Pain:  Vitals:   06/27/16 0652  TempSrc: Oral      Patients Stated Pain Goal: 4 (19/50/93 2671)  Complications: No apparent anesthesia complications

## 2016-06-27 NOTE — Anesthesia Preprocedure Evaluation (Signed)
Anesthesia Evaluation  Patient identified by MRN, date of birth, ID band Patient awake    Reviewed: Allergy & Precautions, NPO status , Patient's Chart, lab work & pertinent test results  History of Anesthesia Complications (+) PONV and history of anesthetic complications  Airway Mallampati: III  TM Distance: >3 FB Neck ROM: Full    Dental  (+) Teeth Intact, Dental Advisory Given   Pulmonary    Pulmonary exam normal        Cardiovascular negative cardio ROS Normal cardiovascular exam     Neuro/Psych  Neuromuscular disease    GI/Hepatic   Endo/Other  diabetes, Poorly Controlled, Type 2, Insulin DependentHypothyroidism   Renal/GU      Musculoskeletal   Abdominal (+) - obese,   Peds  Hematology   Anesthesia Other Findings   Reproductive/Obstetrics                             Anesthesia Physical Anesthesia Plan  ASA: III  Anesthesia Plan: MAC   Post-op Pain Management:    Induction: Intravenous  Airway Management Planned: Nasal Cannula  Additional Equipment:   Intra-op Plan:   Post-operative Plan:   Informed Consent: I have reviewed the patients History and Physical, chart, labs and discussed the procedure including the risks, benefits and alternatives for the proposed anesthesia with the patient or authorized representative who has indicated his/her understanding and acceptance.     Plan Discussed with: CRNA  Anesthesia Plan Comments:         Anesthesia Quick Evaluation

## 2016-06-27 NOTE — Anesthesia Postprocedure Evaluation (Signed)
Anesthesia Post Note  Patient: Nathan Tyler  Procedure(s) Performed: Procedure(s) (LRB): AMPUTATION 5TH TOE RIGHT FOOT (Right)  Patient location during evaluation: PACU Anesthesia Type: MAC Level of consciousness: awake and alert and oriented Pain management: pain level controlled Vital Signs Assessment: post-procedure vital signs reviewed and stable Respiratory status: spontaneous breathing and respiratory function stable Cardiovascular status: stable Postop Assessment: no signs of nausea or vomiting Anesthetic complications: no     Last Vitals:  Vitals:   06/27/16 0700 06/27/16 0718  BP:  120/82  Pulse:    Resp: (!) 37 (!) 35  Temp:      Last Pain:  Vitals:   06/27/16 0652  TempSrc: Oral                 ADAMS, AMY A

## 2016-06-27 NOTE — H&P (Signed)
HISTORY AND PHYSICAL INTERVAL NOTE:  06/27/2016  7:17 AM  Nathan Tyler  has presented today for surgery, with the diagnosis of osteomyelitis 5th toe right foot, ulceration 5th toe right foot,diabetes mellitus with peripheral neuropahty.  The various methods of treatment have been discussed with the patient.  No guarantees were given.  After consideration of risks, benefits and other options for treatment, the patient has consented to surgery.  I have reviewed the patients' chart and labs.    Patient Vitals for the past 24 hrs:  BP Temp Temp src Pulse Resp SpO2  06/27/16 0700 - - - - (!) 37 93 %  06/27/16 0652 126/76 97.6 F (36.4 C) Oral 80 20 100 %    A history and physical examination was performed in my office.  The patient was reexamined.  There have been no changes to this history and physical examination.  Marcheta Grammes, DPM

## 2016-06-28 ENCOUNTER — Encounter (HOSPITAL_COMMUNITY): Payer: Self-pay | Admitting: Podiatry

## 2016-07-10 DIAGNOSIS — Z4889 Encounter for other specified surgical aftercare: Secondary | ICD-10-CM | POA: Diagnosis not present

## 2016-07-18 DIAGNOSIS — I10 Essential (primary) hypertension: Secondary | ICD-10-CM | POA: Diagnosis not present

## 2016-07-18 DIAGNOSIS — Z125 Encounter for screening for malignant neoplasm of prostate: Secondary | ICD-10-CM | POA: Diagnosis not present

## 2016-07-18 DIAGNOSIS — E1142 Type 2 diabetes mellitus with diabetic polyneuropathy: Secondary | ICD-10-CM | POA: Diagnosis not present

## 2016-07-18 DIAGNOSIS — E782 Mixed hyperlipidemia: Secondary | ICD-10-CM | POA: Diagnosis not present

## 2016-07-18 DIAGNOSIS — E039 Hypothyroidism, unspecified: Secondary | ICD-10-CM | POA: Diagnosis not present

## 2016-07-19 DIAGNOSIS — Z940 Kidney transplant status: Secondary | ICD-10-CM | POA: Diagnosis not present

## 2016-07-19 DIAGNOSIS — N183 Chronic kidney disease, stage 3 (moderate): Secondary | ICD-10-CM | POA: Diagnosis not present

## 2016-07-19 DIAGNOSIS — I10 Essential (primary) hypertension: Secondary | ICD-10-CM | POA: Diagnosis not present

## 2016-07-25 DIAGNOSIS — E039 Hypothyroidism, unspecified: Secondary | ICD-10-CM | POA: Diagnosis not present

## 2016-07-25 DIAGNOSIS — E1142 Type 2 diabetes mellitus with diabetic polyneuropathy: Secondary | ICD-10-CM | POA: Diagnosis not present

## 2016-07-25 DIAGNOSIS — N529 Male erectile dysfunction, unspecified: Secondary | ICD-10-CM | POA: Diagnosis not present

## 2016-07-25 DIAGNOSIS — Z940 Kidney transplant status: Secondary | ICD-10-CM | POA: Diagnosis not present

## 2016-07-25 DIAGNOSIS — E782 Mixed hyperlipidemia: Secondary | ICD-10-CM | POA: Diagnosis not present

## 2016-07-25 DIAGNOSIS — Z6827 Body mass index (BMI) 27.0-27.9, adult: Secondary | ICD-10-CM | POA: Diagnosis not present

## 2016-07-25 DIAGNOSIS — R634 Abnormal weight loss: Secondary | ICD-10-CM | POA: Diagnosis not present

## 2016-08-07 DIAGNOSIS — D631 Anemia in chronic kidney disease: Secondary | ICD-10-CM | POA: Diagnosis not present

## 2016-08-07 DIAGNOSIS — Z940 Kidney transplant status: Secondary | ICD-10-CM | POA: Diagnosis not present

## 2016-09-14 ENCOUNTER — Ambulatory Visit: Payer: Medicare Other | Admitting: Urology

## 2016-09-26 DIAGNOSIS — E039 Hypothyroidism, unspecified: Secondary | ICD-10-CM | POA: Diagnosis not present

## 2016-09-26 DIAGNOSIS — E1142 Type 2 diabetes mellitus with diabetic polyneuropathy: Secondary | ICD-10-CM | POA: Diagnosis not present

## 2016-09-28 DIAGNOSIS — E782 Mixed hyperlipidemia: Secondary | ICD-10-CM | POA: Diagnosis not present

## 2016-09-28 DIAGNOSIS — Z0000 Encounter for general adult medical examination without abnormal findings: Secondary | ICD-10-CM | POA: Diagnosis not present

## 2016-09-28 DIAGNOSIS — R945 Abnormal results of liver function studies: Secondary | ICD-10-CM | POA: Diagnosis not present

## 2016-09-28 DIAGNOSIS — G255 Other chorea: Secondary | ICD-10-CM | POA: Diagnosis not present

## 2016-09-28 DIAGNOSIS — E039 Hypothyroidism, unspecified: Secondary | ICD-10-CM | POA: Diagnosis not present

## 2016-09-28 DIAGNOSIS — N529 Male erectile dysfunction, unspecified: Secondary | ICD-10-CM | POA: Diagnosis not present

## 2016-09-28 DIAGNOSIS — E1142 Type 2 diabetes mellitus with diabetic polyneuropathy: Secondary | ICD-10-CM | POA: Diagnosis not present

## 2016-09-28 DIAGNOSIS — Z940 Kidney transplant status: Secondary | ICD-10-CM | POA: Diagnosis not present

## 2016-09-28 DIAGNOSIS — Z6829 Body mass index (BMI) 29.0-29.9, adult: Secondary | ICD-10-CM | POA: Diagnosis not present

## 2016-10-25 MED ORDER — LURASIDONE 20 MG TABLET
ORAL_TABLET | Freq: Every morning | ORAL | 6 refills | 0.00000 days | Status: CP
Start: 2016-10-25 — End: 2017-01-18

## 2016-10-26 DIAGNOSIS — L851 Acquired keratosis [keratoderma] palmaris et plantaris: Secondary | ICD-10-CM | POA: Diagnosis not present

## 2016-10-26 DIAGNOSIS — B351 Tinea unguium: Secondary | ICD-10-CM | POA: Diagnosis not present

## 2016-10-26 DIAGNOSIS — E1142 Type 2 diabetes mellitus with diabetic polyneuropathy: Secondary | ICD-10-CM | POA: Diagnosis not present

## 2017-01-11 DIAGNOSIS — E1142 Type 2 diabetes mellitus with diabetic polyneuropathy: Secondary | ICD-10-CM | POA: Diagnosis not present

## 2017-01-11 DIAGNOSIS — D631 Anemia in chronic kidney disease: Secondary | ICD-10-CM | POA: Diagnosis not present

## 2017-01-11 DIAGNOSIS — B351 Tinea unguium: Secondary | ICD-10-CM | POA: Diagnosis not present

## 2017-01-11 DIAGNOSIS — L851 Acquired keratosis [keratoderma] palmaris et plantaris: Secondary | ICD-10-CM | POA: Diagnosis not present

## 2017-01-11 DIAGNOSIS — Z940 Kidney transplant status: Secondary | ICD-10-CM | POA: Diagnosis not present

## 2017-01-14 DIAGNOSIS — E039 Hypothyroidism, unspecified: Secondary | ICD-10-CM | POA: Diagnosis not present

## 2017-01-14 DIAGNOSIS — E1142 Type 2 diabetes mellitus with diabetic polyneuropathy: Secondary | ICD-10-CM | POA: Diagnosis not present

## 2017-01-14 DIAGNOSIS — Z940 Kidney transplant status: Secondary | ICD-10-CM | POA: Diagnosis not present

## 2017-01-14 DIAGNOSIS — E6609 Other obesity due to excess calories: Secondary | ICD-10-CM | POA: Diagnosis not present

## 2017-01-14 DIAGNOSIS — Z1159 Encounter for screening for other viral diseases: Secondary | ICD-10-CM | POA: Diagnosis not present

## 2017-01-17 DIAGNOSIS — E782 Mixed hyperlipidemia: Secondary | ICD-10-CM | POA: Diagnosis not present

## 2017-01-17 DIAGNOSIS — E6609 Other obesity due to excess calories: Secondary | ICD-10-CM | POA: Diagnosis not present

## 2017-01-17 DIAGNOSIS — Z6828 Body mass index (BMI) 28.0-28.9, adult: Secondary | ICD-10-CM | POA: Diagnosis not present

## 2017-01-17 DIAGNOSIS — E039 Hypothyroidism, unspecified: Secondary | ICD-10-CM | POA: Diagnosis not present

## 2017-01-17 DIAGNOSIS — N189 Chronic kidney disease, unspecified: Secondary | ICD-10-CM | POA: Diagnosis not present

## 2017-01-17 DIAGNOSIS — R945 Abnormal results of liver function studies: Secondary | ICD-10-CM | POA: Diagnosis not present

## 2017-01-17 DIAGNOSIS — E1142 Type 2 diabetes mellitus with diabetic polyneuropathy: Secondary | ICD-10-CM | POA: Diagnosis not present

## 2017-01-17 DIAGNOSIS — G255 Other chorea: Secondary | ICD-10-CM | POA: Diagnosis not present

## 2017-01-18 ENCOUNTER — Ambulatory Visit
Admission: RE | Admit: 2017-01-18 | Discharge: 2017-01-18 | Disposition: A | Discharge status: Home / Self Care | Payer: MEDICARE | Attending: Neurology | Admitting: Neurology

## 2017-01-18 DIAGNOSIS — Z6829 Body mass index (BMI) 29.0-29.9, adult: Secondary | ICD-10-CM | POA: Diagnosis not present

## 2017-01-18 DIAGNOSIS — G255 Other chorea: Secondary | ICD-10-CM | POA: Diagnosis not present

## 2017-01-28 ENCOUNTER — Encounter: Payer: Self-pay | Admitting: Internal Medicine

## 2017-03-12 ENCOUNTER — Encounter: Payer: Self-pay | Admitting: *Deleted

## 2017-03-12 ENCOUNTER — Encounter: Payer: Self-pay | Admitting: Nurse Practitioner

## 2017-03-12 ENCOUNTER — Ambulatory Visit (INDEPENDENT_AMBULATORY_CARE_PROVIDER_SITE_OTHER): Payer: Medicare Other | Admitting: Nurse Practitioner

## 2017-03-12 VITALS — BP 138/83 | HR 69 | Temp 98.0°F | Ht 71.0 in | Wt 214.4 lb

## 2017-03-12 DIAGNOSIS — R7989 Other specified abnormal findings of blood chemistry: Secondary | ICD-10-CM | POA: Insufficient documentation

## 2017-03-12 DIAGNOSIS — R768 Other specified abnormal immunological findings in serum: Secondary | ICD-10-CM | POA: Diagnosis not present

## 2017-03-12 DIAGNOSIS — R945 Abnormal results of liver function studies: Secondary | ICD-10-CM

## 2017-03-12 DIAGNOSIS — B182 Chronic viral hepatitis C: Secondary | ICD-10-CM | POA: Diagnosis not present

## 2017-03-12 DIAGNOSIS — Z87898 Personal history of other specified conditions: Secondary | ICD-10-CM | POA: Diagnosis not present

## 2017-03-12 DIAGNOSIS — F1991 Other psychoactive substance use, unspecified, in remission: Secondary | ICD-10-CM

## 2017-03-12 HISTORY — DX: Other specified abnormal immunological findings in serum: R76.8

## 2017-03-12 NOTE — Assessment & Plan Note (Signed)
Mildly elevated LFTs with AST/ALT in the 80-84 range.  He is positive for hepatitis C.  Normal bilirubin, albumin, alkaline phosphatase.  Normal platelet count.  He is generally asymptomatic from a hepatic standpoint.  Does not appear to have decompensated liver disease at this time but we will evaluate further with ultrasound elastography.  That will establish degree of fibrosis, if any, as well as any signs of portal hypertension including splenomegaly.  Return for follow-up in 4-6 weeks.  Consider hepatitis C treatment either here or at the liver clinic in [place].

## 2017-03-12 NOTE — Assessment & Plan Note (Addendum)
Patient was referred for elevated LFTs and positive hepatitis C antibody confirmed with RNA.  No imaging, no genotype completed.  At this point we will need to further workup the patient including hepatitis C genotype, hepatitis B serologies, HIV screening.  He will also need ultrasound elastography to establish any cirrhosis.  All of this is necessary to help direct treatment.  We will have him complete this testing return to our office in 4-6 weeks.  Of note, he has a history of kidney transplant with an elevated creatinine 10 years post transplant at 1.4.  We may need to refer to the liver clinic in [facility] for complicated clinical issues and hepatitis C treatment.  We will further discussed with the patient will be get his results.  Otitis C risk factors include her birth cohort and IV drug use for about 6 months.  No IV drug use since January 2017.

## 2017-03-12 NOTE — Progress Notes (Signed)
Primary Care Physician:  Celene Squibb, MD Primary Gastroenterologist:  Dr. Gala Romney  Chief Complaint  Patient presents with  . Elevated Hepatic Enzymes  . Hepatitis C    HPI:   Nathan Tyler is a 61 y.o. male who presents on referral from primary care for elevated liver enzymes and positive hepatitis C lab.  PCP notes reviewed, labs reviewed.  AST/ALT 80/84, normal bilirubin, alkaline phosphatase, albumin.  He is status post kidney transplant and his creatinine is doing well at 1.4.  Hepatitis C virus positive, confirmed by RNA with 11.6 million copies.  Per PCP notes, no history of recreational drug use.   No abdominal imaging in our system.  Today he states he's scared to death. Has never tested positive for HCV before. Deneis abdominal pain, N/V, hematochezia, melena, fever, chills, unintentional weight loss, yellowing of skin/eyes, darkened urine. Has tremors (also has dx of Orvan Falconer). Denies chest pain, dyspnea, dizziness, lightheadedness, syncope, near syncope. Denies any other upper or lower GI symptoms.  Hepatitis C Risk Factors:  Birth cohort ([place]): YES IV drug use: YES (11/2014 - 04/2015) Tattoos: No Blood product transfusion: No HC worker: No Hemodialysis: No Maternal infection: No/Unknown   Past Medical History:  Diagnosis Date  . Chorea   . Diabetes mellitus without complication ([place])   . Hypercholesteremia   . Hypothyroidism   . Neuromuscular disorder ([place])   . PONV (postoperative nausea and vomiting)   . Renal failure    Has had kidney transplant 2008    Past Surgical History:  Procedure Laterality Date  . AMPUTATION  2ND TOE LEFT FOOT Left 09/02/2015   Performed by Aviva Signs, MD at [facility]  . AMPUTATION 5TH TOE RIGHT FOOT Right 06/27/2016   Performed by Caprice Beaver, DPM at [facility]  . CATARACT EXTRACTION Bilateral   . KIDNEY TRANSPLANT Right 2010    Current Outpatient Medications  Medication Sig Dispense Refill  . levothyroxine (SYNTHROID,  LEVOTHROID) 100 MCG tablet Take 100 mcg by mouth daily before breakfast.    . lisinopril (PRINIVIL,ZESTRIL) 10 MG tablet Take 10 mg by mouth daily.    . mycophenolate (CELLCEPT) 500 MG tablet Take 500 mg by mouth 2 (two) times daily.    . predniSONE (DELTASONE) 5 MG tablet Take 5 mg by mouth daily with breakfast.    . tacrolimus (PROGRAF) 0.5 MG capsule Take 1 mg by mouth 2 (two) times daily.     . TRESIBA FLEXTOUCH 200 UNIT/ML SOPN Inject 30 Units daily into the skin.     . TRULICITY 1.5 PX/1.0GY SOPN Inject 1.5 mg into the skin every Wednesday.     No current facility-administered medications for this visit.     Allergies as of 03/12/2017  . (No Known Allergies)    Family History  Problem Relation Age of Onset  . Colon cancer Maternal Aunt 75       passed from [facility]    Social History   Socioeconomic History  . Marital status: Widowed    Spouse name: Not on file  . Number of children: Not on file  . Years of education: Not on file  . Highest education level: Not on file  Social Needs  . Financial resource strain: Not on file  . Food insecurity - worry: Not on file  . Food insecurity - inability: Not on file  . Transportation needs - medical: Not on file  . Transportation needs - non-medical: Not on file  Occupational History  .  Not on file  Tobacco Use  . Smoking status: Never Smoker  . Smokeless tobacco: Never Used  Substance and Sexual Activity  . Alcohol use: Yes    Alcohol/week: 0.0 oz    Comment: none last 2 months, moderate in past  . Drug use: No    Comment: None currently (03/12/2017); History of IV drugs 11/2014 - 04/2015  . Sexual activity: Yes    Birth control/protection: None  Other Topics Concern  . Not on file  Social History Narrative  . Not on file    Review of Systems: General: Negative for anorexia, weight loss, fever, chills, fatigue, weakness. ENT: Negative for hoarseness, difficulty swallowing. CV: Negative for chest pain, angina,  palpitations, peripheral edema.  Respiratory: Negative for dyspnea at rest, cough, sputum, wheezing.  GI: See history of present illness. MS: Negative for joint pain, low back pain.  Derm: Negative for rash or itching.  Neuro: Positive for facial tic/neuromuscular disorder.  Endo: Negative for unusual weight change.  Heme: Negative for bruising or bleeding. Allergy: Negative for rash or hives.    Physical Exam: BP 138/83   Pulse 69   Temp 98 F (36.7 C) (Oral)   Ht 5\' 11"  (1.803 m)   Wt 214 lb 6.4 oz (97.3 kg)   BMI 29.90 kg/m  General:   Alert and oriented. Pleasant and cooperative. Well-nourished and well-developed.  Head:  Normocephalic and atraumatic. Eyes:  Without icterus, sclera clear and conjunctiva pink.  Ears:  Normal auditory acuity. Cardiovascular:  S1, S2 present without murmurs appreciated. Extremities without clubbing or edema. Respiratory:  Clear to auscultation bilaterally. No wheezes, rales, or rhonchi. No distress.  Gastrointestinal:  +BS, rounded but soft, non-tender and non-distended. No HSM noted. No guarding or rebound. No masses appreciated.  Rectal:  Deferred  Musculoskalatal:  Symmetrical without gross deformities. Skin:  Intact without significant lesions or rashes. Neurologic:  Alert and oriented x4;  Noted facial tic consistent with neuromuscular disorder, otherwise grossly normal neurologically. Psych:  Alert and cooperative. Normal mood and affect. Heme/Lymph/Immune: No excessive bruising noted.    03/12/2017 9:08 AM   Disclaimer: This note was dictated with voice recognition software. Similar sounding words can inadvertently be transcribed and may not be corrected upon review.

## 2017-03-12 NOTE — Progress Notes (Signed)
cc'ed to pcp °

## 2017-03-12 NOTE — Patient Instructions (Signed)
1. Have your blood work completed when you are able to. 2. We will help schedule your ultrasound for you. 3. Return for follow-up in 4-6 weeks. 4. We will call you and that she know if we need to refer you down to the liver clinic in [place]. 5. Call if you have any questions or concerns.    Happy Thanksgiving!!!

## 2017-03-19 ENCOUNTER — Ambulatory Visit (HOSPITAL_COMMUNITY)
Admission: RE | Admit: 2017-03-19 | Discharge: 2017-03-19 | Disposition: A | Discharge status: Home / Self Care | Payer: Medicare Other | Source: Ambulatory Visit | Attending: Nurse Practitioner | Admitting: Nurse Practitioner

## 2017-03-19 DIAGNOSIS — R7989 Other specified abnormal findings of blood chemistry: Secondary | ICD-10-CM | POA: Diagnosis not present

## 2017-03-19 DIAGNOSIS — B182 Chronic viral hepatitis C: Secondary | ICD-10-CM | POA: Insufficient documentation

## 2017-03-19 DIAGNOSIS — Z87898 Personal history of other specified conditions: Secondary | ICD-10-CM | POA: Insufficient documentation

## 2017-03-19 DIAGNOSIS — F1991 Other psychoactive substance use, unspecified, in remission: Secondary | ICD-10-CM

## 2017-03-19 DIAGNOSIS — R768 Other specified abnormal immunological findings in serum: Secondary | ICD-10-CM

## 2017-03-19 DIAGNOSIS — R945 Abnormal results of liver function studies: Secondary | ICD-10-CM

## 2017-03-19 LAB — HEPATITIS B SURFACE ANTIBODY,QUALITATIVE: Hep B S Ab: NONREACTIVE

## 2017-03-19 LAB — HEPATITIS B CORE ANTIBODY, TOTAL: Hep B Core Total Ab: NONREACTIVE

## 2017-03-19 LAB — HEPATITIS B SURFACE ANTIGEN: HEP B S AG: NONREACTIVE

## 2017-03-19 LAB — HIV ANTIBODY (ROUTINE TESTING W REFLEX): HIV: NONREACTIVE

## 2017-03-19 LAB — HEPATITIS C GENOTYPE: HCV Genotype: 3

## 2017-03-22 DIAGNOSIS — L851 Acquired keratosis [keratoderma] palmaris et plantaris: Secondary | ICD-10-CM | POA: Diagnosis not present

## 2017-03-22 DIAGNOSIS — E1142 Type 2 diabetes mellitus with diabetic polyneuropathy: Secondary | ICD-10-CM | POA: Diagnosis not present

## 2017-03-22 DIAGNOSIS — B351 Tinea unguium: Secondary | ICD-10-CM | POA: Diagnosis not present

## 2017-03-22 DIAGNOSIS — L97521 Non-pressure chronic ulcer of other part of left foot limited to breakdown of skin: Secondary | ICD-10-CM | POA: Diagnosis not present

## 2017-04-03 ENCOUNTER — Telehealth: Payer: Self-pay | Admitting: Internal Medicine

## 2017-04-03 NOTE — Telephone Encounter (Signed)
Spoke with pt and he is aware of my attempts to reach medicare in reference to his treatment.

## 2017-04-03 NOTE — Telephone Encounter (Signed)
Pt was returning a call from Friday to AM. I told him that she was with another patient and would call him back. He said that his shift starts in 15 minutes if she could call before then. [phone]

## 2017-04-05 DIAGNOSIS — L97522 Non-pressure chronic ulcer of other part of left foot with fat layer exposed: Secondary | ICD-10-CM | POA: Diagnosis not present

## 2017-04-05 DIAGNOSIS — E1142 Type 2 diabetes mellitus with diabetic polyneuropathy: Secondary | ICD-10-CM | POA: Diagnosis not present

## 2017-04-19 DIAGNOSIS — N183 Chronic kidney disease, stage 3 (moderate): Secondary | ICD-10-CM | POA: Diagnosis not present

## 2017-04-19 DIAGNOSIS — E1142 Type 2 diabetes mellitus with diabetic polyneuropathy: Secondary | ICD-10-CM | POA: Diagnosis not present

## 2017-04-19 DIAGNOSIS — L97521 Non-pressure chronic ulcer of other part of left foot limited to breakdown of skin: Secondary | ICD-10-CM | POA: Diagnosis not present

## 2017-04-19 DIAGNOSIS — D631 Anemia in chronic kidney disease: Secondary | ICD-10-CM | POA: Diagnosis not present

## 2017-04-26 ENCOUNTER — Encounter: Payer: Self-pay | Admitting: Nurse Practitioner

## 2017-04-26 ENCOUNTER — Other Ambulatory Visit: Payer: Self-pay | Admitting: *Deleted

## 2017-04-26 ENCOUNTER — Ambulatory Visit (INDEPENDENT_AMBULATORY_CARE_PROVIDER_SITE_OTHER): Payer: Medicare Other | Admitting: Nurse Practitioner

## 2017-04-26 VITALS — BP 139/87 | HR 71 | Temp 97.0°F | Ht 71.0 in | Wt 212.0 lb

## 2017-04-26 DIAGNOSIS — R945 Abnormal results of liver function studies: Secondary | ICD-10-CM

## 2017-04-26 DIAGNOSIS — R7989 Other specified abnormal findings of blood chemistry: Secondary | ICD-10-CM

## 2017-04-26 DIAGNOSIS — R768 Other specified abnormal immunological findings in serum: Secondary | ICD-10-CM

## 2017-04-26 NOTE — Assessment & Plan Note (Signed)
Elevated LFTs likely due to chronic hepatitis C virus.  Other liver functions normal.  No overt hepatic symptoms.  We will plan to refer him to [facility] for treatment.  Return for follow-up in 6 months.

## 2017-04-26 NOTE — Assessment & Plan Note (Signed)
C antibody positive confirmed with RNA and genotype 3.  F2 and some F3 on ultrasound elastography.  No ultrasonographic evidence of portal hypertension.  Platelets normal.  Likely well compensated disease with some scarring from previous moderate alcohol use although none in a few months.  Recommended he avoid alcohol.  Likely HCV source a brief 3-51-month stent of IV drug use but none in 2 years.  Recommend he avoid IV drugs as well.  He is not immune to hepatitis B and I recommended hepatitis A and hepatitis B vaccination.  Return for follow-up in 6 months.  We will refer him to the liver clinic in [facility] for consideration of hepatitis C treatment given his history of liver transplant and antirejection medications.

## 2017-04-26 NOTE — Patient Instructions (Signed)
1. We will refer you to don, NP in [facility] for treatment of hepatitis C. 2. We recommend you obtain hepatitis a and B vaccines.  Your primary care does not have them.  You can check with [employer], or other pharmacies in the area. 3. Return for follow-up in 6 months. 4. Call us if you have any questions or concerns.

## 2017-04-26 NOTE — Progress Notes (Signed)
Referring Provider: Celene Squibb, MD Primary Care Physician:  Celene Squibb, MD Primary GI:  Dr. Gala Romney  Chief Complaint  Patient presents with  . Hepatitis C    HPI:   Nathan Tyler is a 62 y.o. male who presents for follow-up on hepatitis C.  Previous PCP notes indicated mild transaminitis otherwise normal liver function.  Status post kidney transplant creatinine doing well at 1.4.  Hepatitis C virus positive confirmed by RNA with 11.6 million copies.  Denies overt GI or hepatic symptoms.  Risk factors include birth cohort and IV drug use from 11/2014 through 04/2015.  Recommended hepatitis C genotype, hepatitis B serologies, HIV screening as well as ultrasound elastography.  Return for follow-up in 4-6 weeks.  Due to kidney transplant would likely need to be referred down to the liver clinic in [facility] for complicated clinical issues and hepatitis C treatment.  Hepatitis C genotype 3.  Hepatitis B surface antigen, surface antibody, core antibody all negative.  No hepatitis B infection and also not immune to hepatitis B.  HIV negative.  Present elastography found increased echotexture compatible with fatty infiltration with normal portal vein patency and flow.  Spleen of normal size and appearance.  Matavir score F2 and some F3 with moderate risk of fibrosis.  Overall evidence of fibrosis but no cirrhosis, no evidence of portal hypertension on ultrasonography.  Today he states he's feeling pretty good overall. Did have a cold recently but has resolved. Denies abdominal pain, N/V, hematochezia, melena, yellowing of skin/eyes, darkened urine, acute, acute episodic confusion. Continued tremors from history of Chorea. Is planning to start treatment medication for his tremors but is deferring until after HCV treatment. Denies chest pain, dyspnea, dizziness, lightheadedness, syncope, near syncope. Denies any other upper or lower GI symptoms.  Past Medical History:  Diagnosis Date  . Chorea   .  Diabetes mellitus without complication ([place])   . Hypercholesteremia   . Hypothyroidism   . Neuromuscular disorder ([place])   . PONV (postoperative nausea and vomiting)   . Renal failure    Has had kidney transplant 2008    Past Surgical History:  Procedure Laterality Date  . AMPUTATION Right 06/27/2016   Procedure: AMPUTATION 5TH TOE RIGHT FOOT;  Surgeon: Caprice Beaver, DPM;  Location: [facility];  Service: Podiatry;  Laterality: Right;  . AMPUTATION TOE Left 09/02/2015   Procedure: AMPUTATION  2ND TOE LEFT FOOT;  Surgeon: Aviva Signs, MD;  Location: [facility];  Service: General;  Laterality: Left;  . CATARACT EXTRACTION Bilateral   . KIDNEY TRANSPLANT Right 2010    Current Outpatient Medications  Medication Sig Dispense Refill  . levothyroxine (SYNTHROID, LEVOTHROID) 100 MCG tablet Take 100 mcg by mouth daily before breakfast.    . lisinopril (PRINIVIL,ZESTRIL) 10 MG tablet Take 10 mg by mouth daily.    . mycophenolate (CELLCEPT) 500 MG tablet Take 500 mg by mouth 2 (two) times daily.    . predniSONE (DELTASONE) 5 MG tablet Take 5 mg by mouth daily with breakfast.    . tacrolimus (PROGRAF) 0.5 MG capsule Take 1 mg by mouth 2 (two) times daily.     . TRESIBA FLEXTOUCH 200 UNIT/ML SOPN Inject 30 Units daily into the skin.      No current facility-administered medications for this visit.     Allergies as of 04/26/2017  . (No Known Allergies)    Family History  Problem Relation Age of Onset  . Colon cancer Maternal Aunt 20  passed from [place] History  . Marital status: Widowed    Spouse name: None  . Number of children: None  . Years of education: None  . Highest education level: None  Social Needs  . Financial resource strain: None  . Food insecurity - worry: None  . Food insecurity - inability: None  . Transportation needs - medical: None  . Transportation needs - non-medical: None  Occupational History  . None  Tobacco Use  .  Smoking status: Never Smoker  . Smokeless tobacco: Never Used  Substance and Sexual Activity  . Alcohol use: Yes    Alcohol/week: 0.0 oz    Comment: none since October, moderate in past  . Drug use: No    Comment: None currently (04/26/17); History of IV drugs 11/2014 - 04/2015  . Sexual activity: Yes    Birth control/protection: None  Other Topics Concern  . None  Social History Narrative  . None    Review of Systems: General: Negative for anorexia, weight loss, fever, chills, fatigue, weakness. ENT: Negative for hoarseness, difficulty swallowing. CV: Negative for chest pain, angina, palpitations, peripheral edema.  Respiratory: Negative for dyspnea at rest, cough, sputum, wheezing.  GI: See history of present illness. MS: Negative for joint pain, low back pain.  Derm: Negative for rash or itching.  Endo: Negative for unusual weight change.  Heme: Negative for bruising or bleeding. Allergy: Negative for rash or hives.   Physical Exam: BP 139/87   Pulse 71   Temp (!) 97 F (36.1 C) (Oral)   Ht 5\' 11"  (1.803 m)   Wt 212 lb (96.2 kg)   BMI 29.57 kg/m  General:   Alert and oriented. Pleasant and cooperative. Well-nourished and well-developed.  Eyes:  Without icterus, sclera clear and conjunctiva pink.  Ears:  Normal auditory acuity. Cardiovascular:  S1, S2 present without murmurs appreciated. Extremities without clubbing or edema. Respiratory:  Clear to auscultation bilaterally. No wheezes, rales, or rhonchi. No distress.  Gastrointestinal:  +BS, soft, non-tender and non-distended. No HSM noted. No guarding or rebound. No masses appreciated.  Rectal:  Deferred  Musculoskalatal:  Symmetrical without gross deformities. Noted neurological tics. Neurologic:  Alert and oriented x4;  grossly normal neurologically. Psych:  Alert and cooperative. Normal mood and affect. Heme/Lymph/Immune: No excessive bruising noted.    04/26/2017 11:08 AM   Disclaimer: This note was dictated  with voice recognition software. Similar sounding words can inadvertently be transcribed and may not be corrected upon review.

## 2017-04-29 ENCOUNTER — Telehealth: Payer: Self-pay | Admitting: Internal Medicine

## 2017-04-29 NOTE — Telephone Encounter (Signed)
[employer], HE HAS LOST HIS PRESCRIPTION AND NEEDS ANOTHER ONE

## 2017-04-29 NOTE — Progress Notes (Signed)
cc'ed to pcp °

## 2017-04-29 NOTE — Telephone Encounter (Signed)
Pt returned call. New rx written for pt and ready for pickup.

## 2017-04-29 NOTE — Telephone Encounter (Signed)
Lmom, waiting on a return call.  

## 2017-05-10 DIAGNOSIS — E1142 Type 2 diabetes mellitus with diabetic polyneuropathy: Secondary | ICD-10-CM | POA: Diagnosis not present

## 2017-05-10 DIAGNOSIS — L97521 Non-pressure chronic ulcer of other part of left foot limited to breakdown of skin: Secondary | ICD-10-CM | POA: Diagnosis not present

## 2017-05-27 DIAGNOSIS — B182 Chronic viral hepatitis C: Secondary | ICD-10-CM | POA: Diagnosis not present

## 2017-05-27 DIAGNOSIS — K740 Hepatic fibrosis: Secondary | ICD-10-CM | POA: Diagnosis not present

## 2017-05-31 DIAGNOSIS — B351 Tinea unguium: Secondary | ICD-10-CM | POA: Diagnosis not present

## 2017-05-31 DIAGNOSIS — E1142 Type 2 diabetes mellitus with diabetic polyneuropathy: Secondary | ICD-10-CM | POA: Diagnosis not present

## 2017-05-31 DIAGNOSIS — B182 Chronic viral hepatitis C: Secondary | ICD-10-CM | POA: Diagnosis not present

## 2017-05-31 DIAGNOSIS — L851 Acquired keratosis [keratoderma] palmaris et plantaris: Secondary | ICD-10-CM | POA: Diagnosis not present

## 2017-06-24 DIAGNOSIS — E039 Hypothyroidism, unspecified: Secondary | ICD-10-CM | POA: Diagnosis not present

## 2017-06-24 DIAGNOSIS — E1142 Type 2 diabetes mellitus with diabetic polyneuropathy: Secondary | ICD-10-CM | POA: Diagnosis not present

## 2017-06-24 DIAGNOSIS — E782 Mixed hyperlipidemia: Secondary | ICD-10-CM | POA: Diagnosis not present

## 2017-06-25 DIAGNOSIS — Z940 Kidney transplant status: Secondary | ICD-10-CM | POA: Diagnosis not present

## 2017-06-26 DIAGNOSIS — G4761 Periodic limb movement disorder: Secondary | ICD-10-CM | POA: Diagnosis not present

## 2017-06-26 DIAGNOSIS — Z940 Kidney transplant status: Secondary | ICD-10-CM | POA: Diagnosis not present

## 2017-06-26 DIAGNOSIS — N189 Chronic kidney disease, unspecified: Secondary | ICD-10-CM | POA: Diagnosis not present

## 2017-06-26 DIAGNOSIS — Z6829 Body mass index (BMI) 29.0-29.9, adult: Secondary | ICD-10-CM | POA: Diagnosis not present

## 2017-06-26 DIAGNOSIS — B1710 Acute hepatitis C without hepatic coma: Secondary | ICD-10-CM | POA: Diagnosis not present

## 2017-06-26 DIAGNOSIS — E1165 Type 2 diabetes mellitus with hyperglycemia: Secondary | ICD-10-CM | POA: Diagnosis not present

## 2017-07-01 DIAGNOSIS — N183 Chronic kidney disease, stage 3 (moderate): Secondary | ICD-10-CM | POA: Diagnosis not present

## 2017-07-01 DIAGNOSIS — Z940 Kidney transplant status: Secondary | ICD-10-CM | POA: Diagnosis not present

## 2017-07-01 DIAGNOSIS — I10 Essential (primary) hypertension: Secondary | ICD-10-CM | POA: Diagnosis not present

## 2017-07-05 DIAGNOSIS — E1142 Type 2 diabetes mellitus with diabetic polyneuropathy: Secondary | ICD-10-CM | POA: Diagnosis not present

## 2017-07-05 DIAGNOSIS — L97521 Non-pressure chronic ulcer of other part of left foot limited to breakdown of skin: Secondary | ICD-10-CM | POA: Diagnosis not present

## 2017-07-09 DIAGNOSIS — Z940 Kidney transplant status: Secondary | ICD-10-CM | POA: Diagnosis not present

## 2017-07-09 DIAGNOSIS — E1129 Type 2 diabetes mellitus with other diabetic kidney complication: Secondary | ICD-10-CM | POA: Diagnosis not present

## 2017-07-09 DIAGNOSIS — N189 Chronic kidney disease, unspecified: Secondary | ICD-10-CM | POA: Diagnosis not present

## 2017-07-10 DIAGNOSIS — Z940 Kidney transplant status: Secondary | ICD-10-CM | POA: Diagnosis not present

## 2017-07-22 DIAGNOSIS — Z940 Kidney transplant status: Secondary | ICD-10-CM | POA: Diagnosis not present

## 2017-08-09 DIAGNOSIS — E1142 Type 2 diabetes mellitus with diabetic polyneuropathy: Secondary | ICD-10-CM | POA: Diagnosis not present

## 2017-08-09 DIAGNOSIS — B351 Tinea unguium: Secondary | ICD-10-CM | POA: Diagnosis not present

## 2017-08-09 DIAGNOSIS — L851 Acquired keratosis [keratoderma] palmaris et plantaris: Secondary | ICD-10-CM | POA: Diagnosis not present

## 2017-08-12 DIAGNOSIS — Z940 Kidney transplant status: Secondary | ICD-10-CM | POA: Diagnosis not present

## 2017-08-27 DIAGNOSIS — Z940 Kidney transplant status: Secondary | ICD-10-CM | POA: Diagnosis not present

## 2017-09-03 DIAGNOSIS — B182 Chronic viral hepatitis C: Secondary | ICD-10-CM | POA: Diagnosis not present

## 2017-09-11 DIAGNOSIS — B182 Chronic viral hepatitis C: Secondary | ICD-10-CM | POA: Diagnosis not present

## 2017-09-23 DIAGNOSIS — N189 Chronic kidney disease, unspecified: Secondary | ICD-10-CM | POA: Diagnosis not present

## 2017-09-23 DIAGNOSIS — E1165 Type 2 diabetes mellitus with hyperglycemia: Secondary | ICD-10-CM | POA: Diagnosis not present

## 2017-10-02 DIAGNOSIS — B1920 Unspecified viral hepatitis C without hepatic coma: Secondary | ICD-10-CM | POA: Diagnosis not present

## 2017-10-02 DIAGNOSIS — Z0000 Encounter for general adult medical examination without abnormal findings: Secondary | ICD-10-CM | POA: Diagnosis not present

## 2017-10-02 DIAGNOSIS — E782 Mixed hyperlipidemia: Secondary | ICD-10-CM | POA: Diagnosis not present

## 2017-10-02 DIAGNOSIS — G259 Extrapyramidal and movement disorder, unspecified: Secondary | ICD-10-CM | POA: Diagnosis not present

## 2017-10-02 DIAGNOSIS — G4700 Insomnia, unspecified: Secondary | ICD-10-CM | POA: Diagnosis not present

## 2017-10-02 DIAGNOSIS — Z6828 Body mass index (BMI) 28.0-28.9, adult: Secondary | ICD-10-CM | POA: Diagnosis not present

## 2017-10-02 DIAGNOSIS — E1169 Type 2 diabetes mellitus with other specified complication: Secondary | ICD-10-CM | POA: Diagnosis not present

## 2017-10-02 DIAGNOSIS — N189 Chronic kidney disease, unspecified: Secondary | ICD-10-CM | POA: Diagnosis not present

## 2017-10-02 DIAGNOSIS — E039 Hypothyroidism, unspecified: Secondary | ICD-10-CM | POA: Diagnosis not present

## 2017-10-17 ENCOUNTER — Ambulatory Visit: Admit: 2017-10-17 | Discharge: 2017-10-18 | Payer: MEDICARE | Attending: Neurology | Primary: Neurology

## 2017-10-17 DIAGNOSIS — G255 Other chorea: Secondary | ICD-10-CM | POA: Diagnosis not present

## 2017-10-17 DIAGNOSIS — Z6829 Body mass index (BMI) 29.0-29.9, adult: Secondary | ICD-10-CM | POA: Diagnosis not present

## 2017-10-18 DIAGNOSIS — E1142 Type 2 diabetes mellitus with diabetic polyneuropathy: Secondary | ICD-10-CM | POA: Diagnosis not present

## 2017-10-18 DIAGNOSIS — E875 Hyperkalemia: Secondary | ICD-10-CM | POA: Diagnosis not present

## 2017-10-18 DIAGNOSIS — Z940 Kidney transplant status: Secondary | ICD-10-CM | POA: Diagnosis not present

## 2017-10-18 DIAGNOSIS — L851 Acquired keratosis [keratoderma] palmaris et plantaris: Secondary | ICD-10-CM | POA: Diagnosis not present

## 2017-10-18 DIAGNOSIS — B351 Tinea unguium: Secondary | ICD-10-CM | POA: Diagnosis not present

## 2017-10-18 DIAGNOSIS — N189 Chronic kidney disease, unspecified: Secondary | ICD-10-CM | POA: Diagnosis not present

## 2017-10-18 MED ORDER — TETRABENAZINE 12.5 MG TABLET: 13 mg | tablet | Freq: Two times a day (BID) | 0 refills | 0 days | Status: AC

## 2017-10-18 MED ORDER — TETRABENAZINE 12.5 MG TABLET
Freq: Two times a day (BID) | ORAL | 0 refills | 0.00000 days | Status: CP
Start: 2017-10-18 — End: 2018-02-04

## 2017-10-18 NOTE — Unmapped (Signed)
[facility] Specialty Medication Referral: PA APPROVED    Medication (Brand/Generic): Tetrabenazpine 12.5mg  tablets    Initial FSI Test Claim completed with resulted information below:  No PA required  Patient ABLE to fill at [facility] Company: CVSMD  Anticipated Copay: $35.34  Is anticipated copay with a copay card or grant? No    As Co-pay is under $100 defined limit, per policy there will be no further investigation of need for financial assistance at this time unless patient requests. This referral has been communicated to the provider and handed off to the [name] team for further processing and filling of prescribed medication.   ______________________________________________________________________  Please utilize this referral for viewing purposes as it will serve as the central location for all relevant documentation and updates.

## 2017-10-18 NOTE — Unmapped (Signed)
[facility] Specialty Medication Referral: No PA required    Medication (Brand/Generic): TETRABENAZINE 12.5MG  TAB    Initial FSI Test Claim completed with resulted information below:  No PA required  Patient ABLE to fill at [facility]  Insurance Company:  [facility] MED D  Anticipated Copay: $35.34    As Co-pay is under $100 defined limit, per policy there will be no further investigation of need for financial assistance at this time unless patient requests. This referral has been communicated to the provider and handed off to the [facility] team for further processing and filling of prescribed medication.   ______________________________________________________________________  Please utilize this referral for viewing purposes as it will serve as the central location for all relevant documentation and updates.

## 2017-10-22 MED FILL — TETRABENAZINE/12.5MG/TABS: TETRABENAZINE/12.5MG/TABS | 30 days supply | Qty: 60 | Fill #0

## 2017-10-22 NOTE — Unmapped (Signed)
[facility] Neurology  Patient Onboarding/Medication Counseling    Gary Rogers is a 62 y.o. male with chorea who I am counseling today on initiation of therapy.    Medication: Tetrabenazine 12.5 mg tablet    Verified patient's date of birth / HIPAA.  Medications reviewed and verified with patient: Allergies - Medications -    .      Education Provided: ??    Dose/Administration discussed: 1 tablet by mouth twice daily.    Patient has been on this medication in the past and declined counseling except for discussion of side effects (see below).    Side effects discussed: Discussed common side effects, including drowsiness, changes in mood/depression, thoughts of suicidality. If patient experiences any of these, they need to call the doctor.  Patient will receive a Lexi-Comp drug information handout with shipment.    Handling precautions reviewed:  n/a.    Drug Interactions: other medications reviewed and up to date in Epic.  Potential for Qtc prolongation with tacrolimus. Will discuss with Dr. Raenette Rover and suggest periodic ECG monitring.    Comorbidities/Allergies: reviewed and up to date in Epic.    Verified therapy is appropriate and should continue      Delivery Information    Medication Assistance provided: Prior Authorization    Anticipated copay of $35.34 reviewed with patient. Verified delivery address in FSI and reviewed medication storage requirement.    Scheduled delivery date: 10/23/2017    Explained that we ship using UPS and this shipment will not require a signature.      Explained the services we provide at [facility] and that each month we would call to set up refills.  Stressed importance of returning phone calls so that we could ensure they receive their medications in time each month.  Informed patient that we should be setting up refills 7-10 days prior to when they will run out of medication.  Informed patient that welcome packet will be sent.      Patient verbalized understanding of the above information as well as how to contact the pharmacy at [phone] option 4 with any questions/concerns. The pharmacy is open Monday through Friday 8:30am-4:30pm. A pharmacist is available 24/7 via pager to answer any clinical questions they may have.        Patient Specific Needs      ? Patient has no physical, cognitive, or cultural barriers.    ? Patient prefers to have medications discussed with  Patient     ? Patient is able to read and understand education materials at a high school level or above.    ? Patients primary language is  English       Worthy Flank, PharmD, CPP  Clinical Pharmacist, [facility]  Phone: [phone]

## 2017-10-25 ENCOUNTER — Ambulatory Visit (INDEPENDENT_AMBULATORY_CARE_PROVIDER_SITE_OTHER): Payer: Medicare Other | Admitting: Gastroenterology

## 2017-10-25 ENCOUNTER — Encounter: Payer: Self-pay | Admitting: Gastroenterology

## 2017-10-25 DIAGNOSIS — Z860100 Personal history of colon polyps, unspecified: Secondary | ICD-10-CM | POA: Insufficient documentation

## 2017-10-25 DIAGNOSIS — B182 Chronic viral hepatitis C: Secondary | ICD-10-CM | POA: Diagnosis not present

## 2017-10-25 DIAGNOSIS — B1920 Unspecified viral hepatitis C without hepatic coma: Secondary | ICD-10-CM | POA: Insufficient documentation

## 2017-10-25 NOTE — Patient Instructions (Signed)
1. Keep follow up with Roosevelt Locks as planned for the Hepatitis C.  2. We will see you back in six months for follow up.  3. Plan for colonoscopy in 01/2019.

## 2017-10-25 NOTE — Assessment & Plan Note (Signed)
Completed 12-week course of Mavyret under the direction of Roosevelt Locks, NP at [place].  Goes back next month for follow-up as well as labs and ultrasound.  We will keep that appointment.  In addition we will see him back in 6 months. He will call in interim with any questions or concerns.   He reports he has one last Hep B shot to complete his 3 shot series.

## 2017-10-25 NOTE — Progress Notes (Signed)
      Primary Care Physician: Celene Squibb, MD  Primary Gastroenterologist:  Garfield Cornea, MD   Chief Complaint  Patient presents with  . Follow-up    doing well    HPI: Nathan Tyler is a 62 y.o. male here for follow up of Hepatitis C. Last seen in 04/2017. He has been down to [place] and treated by Roosevelt Locks, NP for his HCV. He completed 12 weeks of Mavyret and had post-treatment negative HCV RNA. He will be following up next month for his 12 week post treatment labs. Per records, plans for f/u liver u/s to determine if chronic long term follow up with be needed for steatosis.  Clinically he is doing well. No GI concerns. He has recently started a new medication for his chorea.   Last colonoscopy in [place], 01/2014. States he has h/o colon polyps and is due 01/2019. He cannot recall name of practice or facility where his last procedure was done.     Current Outpatient Medications  Medication Sig Dispense Refill  . Dulaglutide (TRULICITY) 1.5 OX/7.3ZH SOPN Inject into the skin.    Marland Kitchen levothyroxine (SYNTHROID, LEVOTHROID) 100 MCG tablet Take 100 mcg by mouth daily before breakfast.    . lisinopril (PRINIVIL,ZESTRIL) 10 MG tablet Take 20 mg by mouth daily.     . mycophenolate (CELLCEPT) 500 MG tablet Take 500 mg by mouth 2 (two) times daily.    . predniSONE (DELTASONE) 5 MG tablet Take 5 mg by mouth daily with breakfast.    . tacrolimus (PROGRAF) 0.5 MG capsule Take 1 mg by mouth 2 (two) times daily.     . TRESIBA FLEXTOUCH 200 UNIT/ML SOPN Inject 30 Units daily into the skin.      No current facility-administered medications for this visit.     Allergies as of 10/25/2017  . (No Known Allergies)    ROS:  General: Negative for anorexia, weight loss, fever, chills, fatigue, weakness. ENT: Negative for hoarseness, difficulty swallowing , nasal congestion. CV: Negative for chest pain, angina, palpitations, dyspnea on exertion, peripheral edema.  Respiratory: Negative  for dyspnea at rest, dyspnea on exertion, cough, sputum, wheezing.  GI: See history of present illness. GU:  Negative for dysuria, hematuria, urinary incontinence, urinary frequency, nocturnal urination.  Endo: Negative for unusual weight change.    Physical Examination:   BP 132/78   Pulse (!) 59   Temp (!) 97.4 F (36.3 C)   Ht 5\' 11"  (1.803 m)   Wt 209 lb 9.6 oz (95.1 kg)   BMI 29.23 kg/m   General: Well-nourished, well-developed in no acute distress. Irregular movements of his mouth and arms.  Eyes: No icterus. Mouth: Oropharyngeal mucosa moist and pink , no lesions erythema or exudate. Lungs: Clear to auscultation bilaterally.  Heart: Regular rate and rhythm, no murmurs rubs or gallops.  Abdomen: Bowel sounds are normal, nontender, nondistended, no hepatosplenomegaly or masses, no abdominal bruits or hernia , no rebound or guarding.   Extremities: No lower extremity edema. No clubbing or deformities. Neuro: Alert and oriented x 4   Skin: Warm and dry, no jaundice.   Psych: Alert and cooperative, normal mood and affect.     Labs at [facility] liver care dated 09/03/2017 White blood cell count 6600, hemoglobin 13.2, hematocrit 39.7, platelets 298,000, albumin 4, total bilirubin 0.6, alkaline phosphatase 102, AST 15, ALT 10.  HCVRNA not detected.

## 2017-10-25 NOTE — Assessment & Plan Note (Signed)
Patient provided history of previous colon polyps, reporting that he was told to come back in 5 years for his next colonoscopy which would be in October 2020.  We will make arrangements for next colonoscopy in October 2020.

## 2017-11-11 DIAGNOSIS — L03031 Cellulitis of right toe: Secondary | ICD-10-CM | POA: Diagnosis not present

## 2017-11-11 DIAGNOSIS — Z0189 Encounter for other specified special examinations: Secondary | ICD-10-CM | POA: Diagnosis not present

## 2017-11-11 DIAGNOSIS — E1142 Type 2 diabetes mellitus with diabetic polyneuropathy: Secondary | ICD-10-CM | POA: Diagnosis not present

## 2017-11-11 DIAGNOSIS — L97512 Non-pressure chronic ulcer of other part of right foot with fat layer exposed: Secondary | ICD-10-CM | POA: Diagnosis not present

## 2017-11-13 MED ORDER — TETRABENAZINE 12.5 MG TABLET
ORAL_TABLET | Freq: Two times a day (BID) | ORAL | 0 refills | 0.00000 days | Status: CP
Start: 2017-11-13 — End: 2017-11-19

## 2017-11-13 MED ORDER — TETRABENAZINE 12.5 MG TABLET: 13 mg | tablet | Freq: Two times a day (BID) | 0 refills | 0 days | Status: AC

## 2017-11-13 NOTE — Unmapped (Signed)
Patient requesting refill of Xenazine. He has had appropriate follow up with Dr. Raenette Rover and has an appointment scheduled for 7/30. Will refill x 1 month.    Worthy Flank, PharmD, CPP  Clinical Pharmacist, [facility]  Phone: [phone]

## 2017-11-13 NOTE — Unmapped (Signed)
[facility] Specialty Pharmacy Refill Coordination Note  Specialty Medication(s): Tetrabenazine  Additional Medications shipped: none    Gary Rogers, DOB: 15-Mar-1956  Phone: [phone] (home) , Alternate phone contact: N/A  Phone or address changes today?: No  All above HIPAA information was verified with patient.  Shipping Address: [address]  [place] [address]   Insurance changes? No    Completed refill call assessment today to schedule patient's medication shipment from the [facility] [phone]).      Confirmed the medication and dosage are correct and have not changed: Yes, regimen is correct and unchanged.    Confirmed patient started or stopped the following medications in the past month:  No, there are no changes reported at this time.    Are you tolerating your medication?:  Gary Rogers reports tolerating the medication.    ADHERENCE        Did you miss any doses in the past 4 weeks? No missed doses reported.    FINANCIAL/SHIPPING    Delivery Scheduled: Yes, Expected medication delivery date: 11/19/17     The patient will receive an FSI print out for each medication shipped and additional FDA Medication Guides as required.  Patient education from [place] or Robet Leu may also be included in the shipment    Gary Rogers did not have any additional questions at this time.    Delivery address validated in FSI scheduling system: Yes, address listed in FSI is correct.    We will follow up with patient monthly for standard refill processing and delivery.      Thank you,  Gary Rogers   [facility] Specialty Pharmacist

## 2017-11-15 DIAGNOSIS — E1142 Type 2 diabetes mellitus with diabetic polyneuropathy: Secondary | ICD-10-CM | POA: Diagnosis not present

## 2017-11-15 DIAGNOSIS — L97511 Non-pressure chronic ulcer of other part of right foot limited to breakdown of skin: Secondary | ICD-10-CM | POA: Diagnosis not present

## 2017-11-15 DIAGNOSIS — L03031 Cellulitis of right toe: Secondary | ICD-10-CM | POA: Diagnosis not present

## 2017-11-17 MED FILL — TETRABENAZINE/12.5MG/TABS: TETRABENAZINE/12.5MG/TABS | 30 days supply | Qty: 60 | Fill #0

## 2017-11-19 ENCOUNTER — Ambulatory Visit: Admit: 2017-11-19 | Discharge: 2017-11-20 | Payer: MEDICARE | Attending: Neurology | Primary: Neurology

## 2017-11-19 DIAGNOSIS — G255 Other chorea: Secondary | ICD-10-CM | POA: Diagnosis not present

## 2017-11-19 DIAGNOSIS — Z6829 Body mass index (BMI) 29.0-29.9, adult: Secondary | ICD-10-CM | POA: Diagnosis not present

## 2017-11-19 MED ORDER — TETRABENAZINE 12.5 MG TABLET
ORAL_TABLET | Freq: Two times a day (BID) | ORAL | 6 refills | 0.00000 days | Status: CP
Start: 2017-11-19 — End: 2018-02-04
  Filled 2017-12-06: qty 120, 30d supply, fill #0

## 2017-11-19 MED ORDER — TETRABENAZINE 12.5 MG TABLET: each | 6 refills | 0 days

## 2017-11-19 NOTE — Unmapped (Signed)
INSTRUCTIONS:  1.  After review today, happy to see that even initial doses of tetrabenazine were helpful at reducing her chorea in an effective fashion that you can appreciate  2.  For the moment you were interested in trying to augment therapy if tolerated.  Keep in mind the previously you got lightheaded/dizzy in the setting of using higher doses but we were happy to try again  3.  Recommend the following changes:    Month 1: Tetrabenazine 12.5 mg, take 2 tablets every morning, 1 tablet every evening  Month 2: If you would like, you are welcome to try and increase tetrabenazine 12.5 mg, take 2 tablets twice a day (but feel free to continue the lower dose if beneficial)    4.  Return to clinic otherwise in 3-6 months, or sooner if need be.

## 2017-11-19 NOTE — Unmapped (Signed)
FIN[facility] [address]  [facility] NEUROLOGY CLINIC [place] [address] [place]  [address]  [place] [address]  [phone]    Date: 11/19/2017  Patient Name: Gary Rogers  MRN: [id number]  PCP: [facility], Maryjo Rochester, *    Assessment:      Mr. Steckman is a 62 y.o. male who  has a past medical history of Anxiety, Chorea, Chronic kidney disease, Depression, Diabetes mellitus, type 2 (CMS-HCC), Hyperlipidemia, Hypertension, and Hyperthyroidism. presenting in follow up for evaluation of mostly generalized, asymmetric, adventitious movements sparing the upper face (left greater than right), historically beginning in a rather acute/subacute fashion in 2009 when he apparently had very poorly controlled blood sugars and development/appreciation of chronic kidney disease requiring renal transplant, with subsequent improvement of his chorea both subjectively and apparently objectively through follow-up clinic notes from the 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    1. 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 He develops parakinesias during examination, with a nonstereoptyped movement that flows from joint to joint, matching with suspected choreiform disorder. Some of his speech difficulties could be apraxic in nature versus ataxic in nature should he have had any involvement of his brainstem/cerebellum with the underlying toxic/metabolic process, but may just as well be related to lower facial chorea. I might argue the latter case after seeing his lower facial dyskinesias more vividly in the past off of therapy.    Since last visit with me, the patient did reinitiated his tetrabenazine and has benefited already quite well from initiating lower doses of therapy.  There is no clear wearing off between doses of therapy either.  However, he wishes there might be a bit of a more robust effect from each dose, for which reason I was in agreement to attempt a trial of increasing his tetrabenazine, first taking an additional tablet every morning, subsequently after a month considering an additional tablet in the evening once reaching 50 mg of tetrabenazine a day, there would be concerns about the possibility of his ability to effectively/safely metabolize the product depending on whether his CYP2D6 enzyme is effectively metabolizing or if he is a slow metabolizer.  Much of this has to do with concerns over hyperthermia that can develop in the setting of being a poor metabolizer, and so we may have to consider enzyme testing in the future especially if we ever wish to bump medication above this dose.    For now he knows to watch for and report a number of potential side effects should they occur at higher doses of therapy.  I will see him back in 3-6 months, or sooner if need be.    *Greater than 50% of today's 30 minute clinic visit was dedicated to face-to-face counseling on all points mentioned above.      Plan:      1.  After review today, happy to see that even initial doses of tetrabenazine were helpful at reducing chorea in an effective fashion that we both can appreciate  2.  For the moment he was interested in trying to augment therapy if tolerated, recalling that previously he was lightheaded/dizzy in the setting of higher doses  3.  To attempt the trial at his request, recommend the following changes:    Month 1: Tetrabenazine 12.5 mg, take 2 tablets every morning, 1 tablet every evening  Month 2: If he would like, he is welcome to try and increase tetrabenazine 12.5 mg, take 2 tablets twice a day (but feel free to continue the lower dose if beneficial)    4.  Return to clinic otherwise in 3-6 months, or sooner if need be.    *Patient note was created using Office manager.  Any errors in syntax or even information may not have been identified and edited on initial review prior to signing this note.     Subjective:      HPI: Patient is a 62 y.o. left-handed Caucasian male with past medical history of chronic kidney disease status post renal transplant in 2010, diabetes mellitus previously quite poorly controlled, who presents for follow up of adventitious movements that have been present throughout the body since approximately 2009. These were felt to be toxic-metabolic induced choreaform movements. Please review my initial clinic visit note dated on 09/29.2016 for more information regarding history of present illness.    Interim history: Patient presents today.  Since last visit with me, the patient successfully initiated his tetrabenazine and has already noted a clear benefit in terms of reduced adventitious movements.  He is currently not spitting anything out of his mouth despite his adventitious movements of the tongue, palate, and jaw.  Similarly he is also not dropping things out of his hands as of late.  He feels like he has greater control and it is not nearly as socially impairing to have the current movements.  Still, he wishes he had a bit better control if possible.  He denies any lightheaded spells, denies any changes in mood other than improved depression.  He also notes that morning vertigo spells have basically been eliminated since being on tetrabenazine (better describing these as feeling off balance when he would get out of bed quickly).    CHOREA REGIMEN:  Tetrabenazine 12.5 mg 1 tablet at 9 AM, 9 PM    On review of systems, the patient denies hallucinations, impulsivity, irritability, current memory deficits. He does admit to some difficulties with speech, which were identified by speech therapist previously which seems to be consistent with some poor coordination between different muscle groups of swallowing and speech production. He denies any aspiration spells. Denies double vision. Denies loss of consciousness episodes, does admit to some mild lightheadedness upon standing at times, unchanged. Admits to loss of urine stream, but denies urgency or frequency/hesitancy of urination.  He denies any falls since last visit with me.    Past Medical Hx:    Past Medical History:   Diagnosis Date   ??? Anxiety    ??? Chorea    ??? Chronic kidney disease    ??? Depression    ??? Diabetes mellitus, type 2 (CMS-HCC)    ??? Hyperlipidemia    ??? Hypertension    ??? Hyperthyroidism        Past Surgical Hx:    Past Surgical History:   Procedure Laterality Date   ??? NEPHRECTOMY TRANSPLANTED ORGAN Bilateral 2010       Social Hx:    Social History     Socioeconomic History   ??? Marital status: Widowed  Spouse name: None   ??? Number of children: None   ??? Years of education: None   ??? Highest education level: None   Occupational History   ??? None   Social Needs   ??? Financial resource strain: None   ??? Food insecurity:     Worry: None     Inability: None   ??? Transportation needs:     Medical: None     Non-medical: None   Tobacco Use   ??? Smoking status: Never Smoker   ??? Smokeless tobacco: Never Used   Substance and Sexual Activity   ??? Alcohol use: No     Alcohol/week: 0.0 standard drinks   ??? Drug use: No   ??? Sexual activity: None   Lifestyle   ??? Physical activity:     Days per week: None     Minutes per session: None   ??? Stress: None   Relationships   ??? Social connections:     Talks on phone: None     Gets together: None     Attends religious service: None     Active member of club or organization: None     Attends meetings of clubs or organizations: None     Relationship status: None   Other Topics Concern   ??? None   Social History Narrative    1.  Retired Naval architect    2.  Lives alone, but nearby his mother in [place].    3.  Patient is a widower    4.  Denies any known exposure to toxic substances such as industrial pesticides or radiation.       Family Hx:    Family History   Problem Relation Age of Onset   ??? Thyroid disease Mother    ??? Brain cancer Father    ??? Hearing loss Father    ??? No Known Problems Sister    ??? Diabetes Maternal Grandfather    ??? No Known Problems Sister    ??? Hepatitis Son         Hepatitis C   ??? No Known Problems Daughter        ALLERGIES:  No Known Allergies    CURRENT MEDICATIONS:    Current Outpatient Medications   Medication Sig Dispense Refill   ??? dulaglutide (TRULICITY) 1.5 mg/0.5 mL PnIj Inject 1.5 mg under the skin every seven (7) days.     ??? insulin degludec (TRESIBA FLEXTOUCH U-200) 200 unit/mL (3 mL) InPn Inject under the skin.     ??? levothyroxine (SYNTHROID, LEVOTHROID) 100 MCG tablet Take 100 mcg by mouth daily at 0600.     ??? lisinopril (PRINIVIL,ZESTRIL) 10 MG tablet Take 20 mg by mouth daily at 0600.   3   ??? mycophenolate (CELLCEPT) 500 mg tablet Take by mouth Two (2) times a day.     ??? predniSONE (DELTASONE) 5 MG tablet Take 5 mg by mouth daily.     ??? tacrolimus (PROGRAF) 0.5 MG capsule Take 0.5 mg by mouth Two (2) times a day. TAKE 4 CAPSULES BY MOUTH TWICE DAILY     ??? tetrabenazine (XENAZINE) 12.5 MG tablet Take 1 tablet (12.5 mg total) by mouth Two (2) times a day. 60 tablet 0     No current facility-administered medications for this visit.      Review of Systems:  A 10-systems review was performed and, unless otherwise noted, declared negative by patient.     Objective:      Physical  Exam:  Vitals:    11/19/17 1551 11/19/17 1552   Orthostatic BP: 129/75 137/83   Orthostatic Pulse: 87 121   BP Site: R Arm R Arm   BP Position: Sitting Standing   BP Cuff Size: Medium Medium Neurological Examination:   MS: AAOx3, naming/fluency/repetition intact. Following lateralizing commands across midline     Cranial nerves: PERRL, approx. 4.5 to 3.65mm brisk bilaterally. EOMI, no nystagmus appreciated, with some interrupting saccades. VFF to confrontation. No facial asymmetry. Tongue protrudes midline.  V1-3 intact to light touch and pinprick. SCM and trapezius 5/5 bilaterally.    Motor: symmetric bulk throughout. No fasciculations.     LEFT Delt 5/5 Tricep 5/5 Bicep 5/5 WExt 5/5 WFlex 5/5 Interr 5/5   HpFlex 5/5 QFem 5/5 Hamst 5/5 ADF 5/5 APF 5/5   RIGHT Delt 5/5 Tricep 5/5 Bicep 5/5 WExt 5/5 WFlex 5/5 Interr 5/5   HpFlex 5/5 QFem 5/5 Hamst 5/5 ADF 5/5 APF 5/5    MOVEMENT DISORDERS EXAMINATION:  Facial expression was normal . Volume of speech was normal . Handwriting was not assessed. There was no evidence of rest tremor. There was no evidence of postural tremor. There was no evidence of action tremor. Tone was normal in the neck and tone was normal in the right upper extremity, left upper extremity, right lower extremity and left lower extremity (although there was evidence of several paratonias on the left hemibody greater than right hemibody).  Rapid alternating movements were normal in amplitude and speed with no breakdown in the right upper extremity, left upper extremity, right lower extremity and left lower extremity.  Foot tap was slow bilaterally. He could get up from a low lying chair without help of both hands..  Gait was unsteady with good heel to toe stride without alternating length of stride.  Arm swing was normal bilaterally with some parakinesias appreciated.     In addition, there were evident choreiform movements of the upper extremities, right upper extremity worse than left upper extremity today, nearly absent in the trunk. Lower extremities are also involved, but only evident during ambulation. There is no evidence of dysdiadochokinesia. There is evidence of a positive milkmaid grip bilaterally, minimal to mild. Able to maintain prolonged tongue protrusion without difficulty.    Reflexes: flexor plantar responses bilaterally     LEFT Bicep 2 Tricep 2 BrachR 2 Patell 2 AJ 1 Hoffman absent  RIGHT Bicep 2 Tricep 2 BrachR 2 Patell 2 AJ 1 Hoffman absent    A = absent   P = present   B = Brisk     Coordination: Finger-nose testing bilaterally demonstrates no evidence of dysmetria or ataxia.

## 2017-11-27 DIAGNOSIS — Z0000 Encounter for general adult medical examination without abnormal findings: Secondary | ICD-10-CM | POA: Diagnosis not present

## 2017-11-27 DIAGNOSIS — N189 Chronic kidney disease, unspecified: Secondary | ICD-10-CM | POA: Diagnosis not present

## 2017-11-27 DIAGNOSIS — E1165 Type 2 diabetes mellitus with hyperglycemia: Secondary | ICD-10-CM | POA: Diagnosis not present

## 2017-11-27 DIAGNOSIS — Z940 Kidney transplant status: Secondary | ICD-10-CM | POA: Diagnosis not present

## 2017-11-27 DIAGNOSIS — E1169 Type 2 diabetes mellitus with other specified complication: Secondary | ICD-10-CM | POA: Diagnosis not present

## 2017-11-27 DIAGNOSIS — G259 Extrapyramidal and movement disorder, unspecified: Secondary | ICD-10-CM | POA: Diagnosis not present

## 2017-11-27 DIAGNOSIS — E119 Type 2 diabetes mellitus without complications: Secondary | ICD-10-CM | POA: Diagnosis not present

## 2017-11-27 DIAGNOSIS — Z6829 Body mass index (BMI) 29.0-29.9, adult: Secondary | ICD-10-CM | POA: Diagnosis not present

## 2017-11-27 DIAGNOSIS — Z6828 Body mass index (BMI) 28.0-28.9, adult: Secondary | ICD-10-CM | POA: Diagnosis not present

## 2017-11-27 DIAGNOSIS — B1710 Acute hepatitis C without hepatic coma: Secondary | ICD-10-CM | POA: Diagnosis not present

## 2017-11-27 DIAGNOSIS — E039 Hypothyroidism, unspecified: Secondary | ICD-10-CM | POA: Diagnosis not present

## 2017-11-27 DIAGNOSIS — G4761 Periodic limb movement disorder: Secondary | ICD-10-CM | POA: Diagnosis not present

## 2017-11-29 NOTE — Unmapped (Signed)
TETRABENAZINE 12.5MG  TABLET DOSE INCREASE  TEST CLAIM $70.66

## 2017-12-05 DIAGNOSIS — B182 Chronic viral hepatitis C: Secondary | ICD-10-CM | POA: Diagnosis not present

## 2017-12-05 NOTE — Unmapped (Signed)
Patient called - he received call from [facility] about his tetrabenazine but deleted the message and forgot to write down the # - provided # to [facility] and he will call them back.

## 2017-12-05 NOTE — Unmapped (Signed)
[facility] Specialty Pharmacy Refill Coordination Note  Specialty Medication(s): Tetrabenzine  Additional Medications shipped: n/a    Gary Rogers, DOB: [birth year]  Phone: [phone] (home) , Alternate phone contact: N/A  Phone or address changes today?: No  All above HIPAA information was verified with patient.  Shipping Address: [address]  [place] [address]   Insurance changes? No    Completed refill call assessment today to schedule patient's medication shipment from the [facility] [phone]).      Confirmed the medication and dosage are correct and have not changed: No, patient reports changes to the regimen as follows: dose has increased to two tablets twice daily    Confirmed patient started or stopped the following medications in the past month:  No, there are no changes reported at this time.    Are you tolerating your medication?:  Gary Rogers reports tolerating the medication.    ADHERENCE    Is this medicine transplant or covered by Medicare Part B? No.        Did you miss any doses in the past 4 weeks? No missed doses reported.    FINANCIAL/SHIPPING    Delivery Scheduled: Yes, Expected medication delivery date: 12/09/17     The patient will receive a drug information handout for each medication shipped and additional FDA Medication Guides as required.     Eros did not have any additional questions at this time.    Delivery address confirmed in Epic.    We will follow up with patient monthly for standard refill processing and delivery.      Thank you,  Airen Dales Vangie Bicker   [facility] Specialty Pharmacist

## 2017-12-06 MED FILL — TETRABENAZINE 12.5 MG TABLET: 30 days supply | Qty: 120 | Fill #0 | Status: AC

## 2017-12-10 DIAGNOSIS — E1142 Type 2 diabetes mellitus with diabetic polyneuropathy: Secondary | ICD-10-CM | POA: Diagnosis not present

## 2017-12-10 DIAGNOSIS — L97511 Non-pressure chronic ulcer of other part of right foot limited to breakdown of skin: Secondary | ICD-10-CM | POA: Diagnosis not present

## 2017-12-12 DIAGNOSIS — K740 Hepatic fibrosis: Secondary | ICD-10-CM | POA: Diagnosis not present

## 2017-12-12 DIAGNOSIS — B182 Chronic viral hepatitis C: Secondary | ICD-10-CM | POA: Diagnosis not present

## 2017-12-13 ENCOUNTER — Other Ambulatory Visit: Payer: Self-pay | Admitting: Nurse Practitioner

## 2017-12-13 DIAGNOSIS — K760 Fatty (change of) liver, not elsewhere classified: Secondary | ICD-10-CM

## 2018-01-01 DIAGNOSIS — E119 Type 2 diabetes mellitus without complications: Secondary | ICD-10-CM | POA: Diagnosis not present

## 2018-01-01 DIAGNOSIS — Z9842 Cataract extraction status, left eye: Secondary | ICD-10-CM | POA: Diagnosis not present

## 2018-01-01 DIAGNOSIS — H43812 Vitreous degeneration, left eye: Secondary | ICD-10-CM | POA: Diagnosis not present

## 2018-01-01 DIAGNOSIS — Z9841 Cataract extraction status, right eye: Secondary | ICD-10-CM | POA: Diagnosis not present

## 2018-01-01 DIAGNOSIS — Z961 Presence of intraocular lens: Secondary | ICD-10-CM | POA: Diagnosis not present

## 2018-01-02 DIAGNOSIS — E875 Hyperkalemia: Secondary | ICD-10-CM | POA: Diagnosis not present

## 2018-01-02 DIAGNOSIS — Z940 Kidney transplant status: Secondary | ICD-10-CM | POA: Diagnosis not present

## 2018-01-02 DIAGNOSIS — N189 Chronic kidney disease, unspecified: Secondary | ICD-10-CM | POA: Diagnosis not present

## 2018-01-03 DIAGNOSIS — E1142 Type 2 diabetes mellitus with diabetic polyneuropathy: Secondary | ICD-10-CM | POA: Diagnosis not present

## 2018-01-03 DIAGNOSIS — B351 Tinea unguium: Secondary | ICD-10-CM | POA: Diagnosis not present

## 2018-01-03 DIAGNOSIS — L851 Acquired keratosis [keratoderma] palmaris et plantaris: Secondary | ICD-10-CM | POA: Diagnosis not present

## 2018-01-03 NOTE — Unmapped (Signed)
[facility] Specialty Pharmacy Refill Coordination Note  Specialty Medication(s): TETRABENAZINE      Gary Rogers, DOB: 1955-10-30  Phone: [phone] (home) , Alternate phone contact: N/A  Phone or address changes today?: No  All above HIPAA information was verified with patient.  Shipping Address: [address]  [place] [address]   Insurance changes? No    Completed refill call assessment today to schedule patient's medication shipment from the [facility] [phone]).      Confirmed the medication and dosage are correct and have not changed: Yes, regimen is correct and unchanged.    Confirmed patient started or stopped the following medications in the past month:  No, there are no changes reported at this time.    Are you tolerating your medication?:  Gary Rogers reports tolerating the medication.    PATIENT REPORTED 'THINGS ARE IMPROVING'  ADHERENCE    (Below is required for Medicare Part B or Transplant patients only - per drug):   How many tablets were dispensed last month: 30 DAYS  Patient currently has 10 DAYS remaining.    Did you miss any doses in the past 4 weeks? No missed doses reported.    FINANCIAL/SHIPPING    Delivery Scheduled: Yes, Expected medication delivery date: 9/19     The patient will receive a drug information handout for each medication shipped and additional FDA Medication Guides as required.      Gary Rogers did not have any additional questions at this time.    Delivery address validated in Epic.    We will follow up with patient monthly for standard refill processing and delivery.      Thank you,  [name]   [facility] Specialty Technician

## 2018-01-06 ENCOUNTER — Ambulatory Visit
Admission: RE | Admit: 2018-01-06 | Discharge: 2018-01-06 | Disposition: A | Discharge status: Home / Self Care | Payer: Medicare Other | Source: Ambulatory Visit | Attending: Nurse Practitioner | Admitting: Nurse Practitioner

## 2018-01-06 DIAGNOSIS — K7689 Other specified diseases of liver: Secondary | ICD-10-CM | POA: Diagnosis not present

## 2018-01-06 DIAGNOSIS — K760 Fatty (change of) liver, not elsewhere classified: Secondary | ICD-10-CM

## 2018-01-08 MED FILL — TETRABENAZINE 12.5 MG TABLET: 30 days supply | Qty: 120 | Fill #1 | Status: AC

## 2018-01-08 MED FILL — TETRABENAZINE 12.5 MG TABLET: 30 days supply | Qty: 120 | Fill #1

## 2018-01-09 DIAGNOSIS — E875 Hyperkalemia: Secondary | ICD-10-CM | POA: Diagnosis not present

## 2018-02-03 NOTE — Unmapped (Signed)
[facility] Specialty Pharmacy Refill Coordination Note  Specialty Medication(s): tetraenzine 12.5mg   Additional Medications shipped: none    Aahan Marques, DOB: 10-Jan-1956  Phone: [phone] (home) , Alternate phone contact: N/A  Phone or address changes today?: No  All above HIPAA information was verified with patient.  Shipping Address: [address]  [place] [address]   Insurance changes? No    Completed refill call assessment today to schedule patient's medication shipment from the [facility] [phone]).      Confirmed the medication and dosage are correct and have not changed: No, patient reports changes to the regimen as follows: takes 2 qam and 1 qpm- sent request to provider for new rx with updated directions.    Confirmed patient started or stopped the following medications in the past month:  Yes. Keyvin reports starting the following medications: lasix    Are you tolerating your medication?:  Corban reports tolerating the medication.    ADHERENCE    (Below is required for Medicare Part B or Transplant patients only - per drug):   How many tablets were dispensed last month: 120  Patient currently has 13 days worth remaining.    Did you miss any doses in the past 4 weeks? No missed doses reported.    FINANCIAL/SHIPPING    Delivery Scheduled: Yes, Expected medication delivery date: 101/22/2019     The patient will receive a drug information handout for each medication shipped and additional FDA Medication Guides as required.      Valerie did not have any additional questions at this time.    Delivery address validated in Epic.    We will follow up with patient monthly for standard refill processing and delivery.      Thank you,  Breck Coons [facility] Specialty Pharmacist

## 2018-02-04 MED ORDER — TETRABENAZINE 12.5 MG TABLET
6 refills | 0 days | Status: CP
Start: 2018-02-04 — End: 2018-09-18
  Filled 2018-02-10: qty 90, 30d supply, fill #0

## 2018-02-04 NOTE — Unmapped (Signed)
Sonali - not sure if you needed this information back - thanks, Shaily Librizzi, admin to Dr Raenette Rover

## 2018-02-06 DIAGNOSIS — G4761 Periodic limb movement disorder: Secondary | ICD-10-CM | POA: Diagnosis not present

## 2018-02-06 DIAGNOSIS — N189 Chronic kidney disease, unspecified: Secondary | ICD-10-CM | POA: Diagnosis not present

## 2018-02-06 DIAGNOSIS — B1710 Acute hepatitis C without hepatic coma: Secondary | ICD-10-CM | POA: Diagnosis not present

## 2018-02-06 DIAGNOSIS — Z6829 Body mass index (BMI) 29.0-29.9, adult: Secondary | ICD-10-CM | POA: Diagnosis not present

## 2018-02-06 DIAGNOSIS — E1165 Type 2 diabetes mellitus with hyperglycemia: Secondary | ICD-10-CM | POA: Diagnosis not present

## 2018-02-06 DIAGNOSIS — Z940 Kidney transplant status: Secondary | ICD-10-CM | POA: Diagnosis not present

## 2018-02-10 MED FILL — TETRABENAZINE 12.5 MG TABLET: 30 days supply | Qty: 90 | Fill #0 | Status: AC

## 2018-02-13 DIAGNOSIS — E782 Mixed hyperlipidemia: Secondary | ICD-10-CM | POA: Diagnosis not present

## 2018-02-13 DIAGNOSIS — N183 Chronic kidney disease, stage 3 (moderate): Secondary | ICD-10-CM | POA: Diagnosis not present

## 2018-02-13 DIAGNOSIS — E039 Hypothyroidism, unspecified: Secondary | ICD-10-CM | POA: Diagnosis not present

## 2018-02-13 DIAGNOSIS — B1710 Acute hepatitis C without hepatic coma: Secondary | ICD-10-CM | POA: Diagnosis not present

## 2018-02-13 DIAGNOSIS — G4761 Periodic limb movement disorder: Secondary | ICD-10-CM | POA: Diagnosis not present

## 2018-02-13 DIAGNOSIS — E875 Hyperkalemia: Secondary | ICD-10-CM | POA: Diagnosis not present

## 2018-02-13 DIAGNOSIS — G4700 Insomnia, unspecified: Secondary | ICD-10-CM | POA: Diagnosis not present

## 2018-02-13 DIAGNOSIS — D530 Protein deficiency anemia: Secondary | ICD-10-CM | POA: Diagnosis not present

## 2018-02-13 DIAGNOSIS — Z940 Kidney transplant status: Secondary | ICD-10-CM | POA: Diagnosis not present

## 2018-02-13 DIAGNOSIS — E1122 Type 2 diabetes mellitus with diabetic chronic kidney disease: Secondary | ICD-10-CM | POA: Diagnosis not present

## 2018-02-20 DIAGNOSIS — E875 Hyperkalemia: Secondary | ICD-10-CM | POA: Diagnosis not present

## 2018-03-13 DIAGNOSIS — E875 Hyperkalemia: Secondary | ICD-10-CM | POA: Diagnosis not present

## 2018-03-13 NOTE — Unmapped (Signed)
[facility] Specialty Pharmacy Refill Coordination Note  Specialty Medication(s): TETRABENAZINE       Kwabena Strutz, DOB: Nov 22, 1955  Phone: [phone] (home) , Alternate phone contact: N/A  Phone or address changes today?: No  All above HIPAA information was verified with patient.  Shipping Address: [address]  [place] [address]   Insurance changes? No    Completed refill call assessment today to schedule patient's medication shipment from the [facility] [phone]).      Confirmed the medication and dosage are correct and have not changed: Yes, regimen is correct and unchanged.    Confirmed patient started or stopped the following medications in the past month:  No, there are no changes reported at this time.    Are you tolerating your medication?:  Sathvik reports tolerating the medication.    ADHERENCE    (Below is required for Medicare Part B or Transplant patients only - per drug):   How many tablets were dispensed last month: 30 DAYS  Patient currently has 21 DAYS (OVERSTOCK DUE TO DECREASE IN DOSE) remaining.    Did you miss any doses in the past 4 weeks? No missed doses reported.    FINANCIAL/SHIPPING    Delivery Scheduled: Yes, Expected medication delivery date: 12/5     Medication will be delivered via UPS to the home address in [facility].    The patient will receive a drug information handout for each medication shipped and additional FDA Medication Guides as required.      PATIENT WANTED TO KNOW IF HIS INCREASE IN POTASSIUM COULD BE ATTRIBUTED TO TETRABENAZINE- PARKED FOR RPH DAVE R TO TALK TO PATIENT.    We will follow up with patient monthly for standard refill processing and delivery.      Thank you,  [name]   [facility] Specialty Technician

## 2018-03-14 DIAGNOSIS — E1142 Type 2 diabetes mellitus with diabetic polyneuropathy: Secondary | ICD-10-CM | POA: Diagnosis not present

## 2018-03-14 DIAGNOSIS — B351 Tinea unguium: Secondary | ICD-10-CM | POA: Diagnosis not present

## 2018-03-14 DIAGNOSIS — L851 Acquired keratosis [keratoderma] palmaris et plantaris: Secondary | ICD-10-CM | POA: Diagnosis not present

## 2018-03-26 MED FILL — TETRABENAZINE 12.5 MG TABLET: 30 days supply | Qty: 90 | Fill #1 | Status: AC

## 2018-03-26 MED FILL — TETRABENAZINE 12.5 MG TABLET: 30 days supply | Qty: 90 | Fill #1

## 2018-03-31 DIAGNOSIS — E875 Hyperkalemia: Secondary | ICD-10-CM | POA: Diagnosis not present

## 2018-03-31 DIAGNOSIS — Z940 Kidney transplant status: Secondary | ICD-10-CM | POA: Diagnosis not present

## 2018-03-31 DIAGNOSIS — N189 Chronic kidney disease, unspecified: Secondary | ICD-10-CM | POA: Diagnosis not present

## 2018-04-07 ENCOUNTER — Ambulatory Visit: Admit: 2018-04-07 | Discharge: 2018-04-07 | Payer: MEDICARE | Attending: Neurology | Primary: Neurology

## 2018-04-07 ENCOUNTER — Institutional Professional Consult (permissible substitution): Admit: 2018-04-07 | Discharge: 2018-04-07 | Payer: MEDICARE

## 2018-04-07 DIAGNOSIS — G2401 Drug induced subacute dyskinesia: Secondary | ICD-10-CM | POA: Diagnosis not present

## 2018-04-07 DIAGNOSIS — Z6829 Body mass index (BMI) 29.0-29.9, adult: Secondary | ICD-10-CM | POA: Diagnosis not present

## 2018-04-07 DIAGNOSIS — G255 Other chorea: Secondary | ICD-10-CM | POA: Diagnosis not present

## 2018-04-07 LAB — CBC W/ AUTO DIFF
BASOPHILS ABSOLUTE COUNT: 0 10*9/L (ref 0.0–0.1)
EOSINOPHILS ABSOLUTE COUNT: 0.1 10*9/L (ref 0.0–0.4)
EOSINOPHILS RELATIVE PERCENT: 1.3 %
HEMATOCRIT: 39.8 % — ABNORMAL LOW (ref 41.0–53.0)
HEMOGLOBIN: 11.9 g/dL — ABNORMAL LOW (ref 13.5–17.5)
LARGE UNSTAINED CELLS: 2 % (ref 0–4)
LYMPHOCYTES ABSOLUTE COUNT: 2.8 10*9/L (ref 1.5–5.0)
LYMPHOCYTES RELATIVE PERCENT: 35.6 %
MEAN CORPUSCULAR HEMOGLOBIN CONC: 29.9 g/dL — ABNORMAL LOW (ref 31.0–37.0)
MEAN CORPUSCULAR HEMOGLOBIN: 28.8 pg (ref 26.0–34.0)
MEAN CORPUSCULAR VOLUME: 96.3 fL (ref 80.0–100.0)
MEAN PLATELET VOLUME: 7.5 fL (ref 7.0–10.0)
MONOCYTES ABSOLUTE COUNT: 0.5 10*9/L (ref 0.2–0.8)
MONOCYTES RELATIVE PERCENT: 6.3 %
NEUTROPHILS ABSOLUTE COUNT: 4.3 10*9/L (ref 2.0–7.5)
NEUTROPHILS RELATIVE PERCENT: 54.6 %
PLATELET COUNT: 331 10*9/L (ref 150–440)
RED BLOOD CELL COUNT: 4.13 10*12/L — ABNORMAL LOW (ref 4.50–5.90)
RED CELL DISTRIBUTION WIDTH: 14.3 % (ref 12.0–15.0)
WBC ADJUSTED: 7.8 10*9/L (ref 4.5–11.0)

## 2018-04-07 LAB — HEPATIC FUNCTION PANEL
ALT (SGPT): 19 U/L (ref <50)
BILIRUBIN DIRECT: <0.1 mg/dL (ref 0.00–0.40)
BILIRUBIN TOTAL: 0.3 mg/dL (ref 0.0–1.2)
PROTEIN TOTAL: 6.6 g/dL (ref 6.5–8.3)

## 2018-04-07 LAB — ALKALINE PHOSPHATASE: Alkaline phosphatase:CCnc:Pt:Ser/Plas:Qn:: 111

## 2018-04-07 LAB — RED BLOOD CELL COUNT: Lab: 4.13 — ABNORMAL LOW

## 2018-04-07 NOTE — Unmapped (Signed)
ekg per neuro

## 2018-04-07 NOTE — Unmapped (Signed)
[facility] [address]  [facility] NEUROLOGY CLINIC [address]   [address]  [place] [address]  [phone]    Date: 04/07/2018  Patient Name: Gary Rogers  MRN: [id number]  PCP: Gary Rogers, Gary Guys, MD    Assessment:      Gary Rogers is a 62 y.o. male who  has a past medical history of Anxiety, Chorea, Chronic kidney disease, Depression, Diabetes mellitus, type 2 (CMS-HCC), Hyperlipidemia, Hypertension, and Hyperthyroidism. presenting in follow up for evaluation of mostly generalized, asymmetric, adventitious movements sparing the upper face (left greater than right), historically beginning in a rather acute/subacute fashion in 2009 when he apparently had very poorly controlled blood sugars and development/appreciation of chronic kidney disease requiring renal transplant, with subsequent improvement of his chorea both subjectively and apparently objectively through follow-up clinic notes from the [place] [facility] [place] [facility] initially after performing renal transplant, subsequently with introduction of neuroleptics and most recently dopamine depletors, likely etiology of chorea from a toxic/metabolic-induced choreiform disorder given the lack of family history, now with objective evidence that his orobuccolingual movements respond favorably to his tetrabenazine but with EPS symptoms while on the medication (when comparing follow up exam to his video examination while on tetrabenazine).    1. 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 He develops parakinesias during examination, with a nonstereoptyped movement that flows from joint to joint, matching with suspected choreiform disorder. Some of his speech difficulties could be apraxic in nature versus ataxic in nature should he have had any involvement of his brainstem/cerebellum with the underlying toxic/metabolic process, but may just as well be related to lower facial chorea. I might argue the latter case after seeing his lower facial dyskinesias more vividly in the past off of therapy.    Since last visit with me, patient has noted benefit from a mild increase in morning doses of tetrabenazine without significant changes in mood unlike when he previously would take 2 tablets twice a day (25 mg twice a day) where his mood had significantly worsened.  He is overall content with the benefit he is received from medication, and those around him/who work with him appreciate a significant change as well.  The only remaining symptom truly is that of orobuccolingual dyskinesias which respond well to placing a finger in front of the mouth as would be expected with tardive syndromes.  Tardive dyskinesia would certainly be in the differential for the patient suspected toxic/metabolic choreiform disorder, and I am beginning to wonder if (especially in light of his response with a VMAT2 inhibitor) this would be a more accurate diagnosis for the patient although a clear specific medication has not been elucidated to date.  Ultimately, we will maintain current course of symptomatic therapy.    In the event that it is clear that we need to augment therapy in the future, we agreed today that from a safety perspective we should avoid increasing tetrabenazine any further.  Would have to return to review the possibility that patient be switched to an alternative VMAT2 inhibitor, possibly/preferably Ingrezza given my suspicion as above that there may be a component of tardive dyskinesia (if this is not out right the correct diagnosis for this patient).    Surveillance labs and EKG requested today.  Otherwise I will see the patient back in 1 year, or sooner if  need be.    *Greater than 50% of today's 32 minute clinic visit was dedicated to face-to-face counseling on all points mentioned above.      Plan:      1.  After review today, happy to see that increased doses of tetrabenazine were beneficial without causing significant worsening of mood disturbances  2.  Given that previously 25mg  twice a day dosing of tetrabenazine caused significant mood changes, we agreed to keep on the current dosing of therapy without changes  3.  In the event that we feel chorea/dyskinesias clearly worsened by next clinic visit, we may need to consider switching therapy to an alternative but similar medication to tetrabenazine in order to reduce side effect profile  4.  Today we spoke about the possibility that patient's condition may be most consistent with tardive dyskinesia as one potential toxic condition that could have served as his symptom etiology  5.  For now, return to clinic in 9-4 months, or sooner if need be.    *Patient note was created using Office manager.  Any errors in syntax or even information may not have been identified and edited on initial review prior to signing this note.     Subjective:      HPI: Patient is a 62 y.o. left-handed Caucasian male with past medical history of chronic kidney disease status post renal transplant in 2010, diabetes mellitus previously quite poorly controlled, who presents for follow up of adventitious movements that have been present throughout the body since approximately 2009. These were felt to be toxic-metabolic induced choreaform movements. Please review my initial clinic visit note dated on 09/29.2016 for more information regarding history of present illness.    Interim history: Patient presents today alone.  He notes that since last visit with me, he successfully increased his tetrabenazine as delineated below:    CHOREA/DYSKINESIA REGIMEN:  Tetrabenazine 12.5 mg 2 tablets at 9 AM  Tetrabenazine 12.5 mg 1 tablet at 9 PM    On this regimen, he appreciates that he is able to control his adventitious movements much better, has not dropped anything out of his hands whatsoever, has had no falls.  The only balance issue he develops as if he is been standing for a long period of time and he stresses his center of gravity such as trying to walk in a straight line.  He feels that his driving is much better controlled, and others have similarly commented on improve driving skills.  He is had no traffic tickets or accidents since last visit with me.  Similarly, others are commenting on how they no longer can appreciate any significant movements except for his mouth.  Even then, he appreciates that his lip smacking behavior has improved, previously present most minutes of the day and now present most hours of the day but still perhaps present about half the amount of time it used to be.  He never tried to augment therapy to 2 tablets twice a day out of fear that his mood might change.  In fact, he appreciates a hint of occasional depressive symptoms, more so described as dysthymia.  He is had absolutely no harmful thoughts whatsoever, and can quickly get himself out of the feeling because he appreciates that it is related to drug side effect.  It does not bother him nor has it led to any deep depressing thoughts.    Patient reminds me today that he had a 10-year history or more of utilizing metformin when trying to  think of potential toxic agents that may have led to a tardive syndrome.    On review of systems, the patient denies hallucinations, impulsivity, irritability, current memory deficits. He denies any aspiration spells. Denies double vision. Denies loss of consciousness episodes, does admit to some mild lightheadedness upon standing at times, unchanged. Admits to loss of urine stream, but denies urgency or frequency/hesitancy of urination.  He denies any falls since last visit with me.    Past Medical Hx:    Past Medical History:   Diagnosis Date   ??? Anxiety    ??? Chorea    ??? Chronic kidney disease    ??? Depression    ??? Diabetes mellitus, type 2 (CMS-HCC)    ??? Hyperlipidemia    ??? Hypertension    ??? Hyperthyroidism        Past Surgical Hx:    Past Surgical History:   Procedure Laterality Date   ??? NEPHRECTOMY TRANSPLANTED ORGAN Bilateral 2010       Social Hx:    Social History     Socioeconomic History   ??? Marital status: Widowed     Spouse name: Not on file   ??? Number of children: Not on file   ??? Years of education: Not on file   ??? Highest education level: Not on file   Occupational History   ??? Not on file   Social Needs   ??? Financial resource strain: Not on file   ??? Food insecurity:     Worry: Not on file     Inability: Not on file   ??? Transportation needs:     Medical: Not on file     Non-medical: Not on file   Tobacco Use   ??? Smoking status: Never Smoker   ??? Smokeless tobacco: Never Used   Substance and Sexual Activity   ??? Alcohol use: No     Alcohol/week: 0.0 standard drinks   ??? Drug use: No   ??? Sexual activity: Not on file   Lifestyle   ??? Physical activity:     Days per week: Not on file     Minutes per session: Not on file   ??? Stress: Not on file   Relationships   ??? Social connections:     Talks on phone: Not on file     Gets together: Not on file     Attends religious service: Not on file     Active member of club or organization: Not on file     Attends meetings of clubs or organizations: Not on file     Relationship status: Not on file   Other Topics Concern   ??? Not on file   Social History Narrative    1.  Retired Naval architect    2.  Lives alone, but nearby his mother in [place].    3.  Patient is a widower    4.  Denies any known exposure to toxic substances such as industrial pesticides or radiation.       Family Hx:    Family History   Problem Relation Age of Onset   ??? Thyroid disease Mother    ??? Brain cancer Father    ??? Hearing loss Father    ??? No Known Problems Sister    ??? Diabetes Maternal Grandfather    ??? No Known Problems Sister    ??? Hepatitis Son         Hepatitis C   ??? No Known Problems  Daughter        ALLERGIES: No Known Allergies    CURRENT MEDICATIONS:    Current Outpatient Medications   Medication Sig Dispense Refill   ??? dulaglutide (TRULICITY) 1.5 mg/0.5 mL PnIj Inject 1.5 mg under the skin every seven (7) days.     ??? insulin degludec (TRESIBA FLEXTOUCH U-200) 200 unit/mL (3 mL) InPn Inject under the skin.     ??? levothyroxine (SYNTHROID, LEVOTHROID) 100 MCG tablet Take 100 mcg by mouth daily at 0600.     ??? lisinopril (PRINIVIL,ZESTRIL) 10 MG tablet Take 20 mg by mouth daily at 0600.   3   ??? mycophenolate (CELLCEPT) 500 mg tablet Take by mouth Two (2) times a day.     ??? predniSONE (DELTASONE) 5 MG tablet Take 5 mg by mouth daily.     ??? tacrolimus (PROGRAF) 0.5 MG capsule Take 0.5 mg by mouth Two (2) times a day. TAKE 4 CAPSULES BY MOUTH TWICE DAILY     ??? tetrabenazine (XENAZINE) 12.5 MG tablet TAKE 2 TABLETS (25 MG) BY MOUTH EVERY MORNING AND TAKE 1 TABLET (12.5 MG) EVERY EVENING 90 each 6     No current facility-administered medications for this visit.      Review of Systems:  A 10-systems review was performed and, unless otherwise noted, declared negative by patient.     Objective:      Physical Exam:  There were no vitals filed for this visit.     Neurological Examination:   MS: AAOx3, naming/fluency/repetition intact. Following lateralizing commands across midline     Cranial nerves: PERRL, approx. 4.5 to 3.79mm brisk bilaterally. EOMI except for inability to bury sclera on the right lateral gaze, right eye only (even with monocular testing). End gaze horizontal nystagmus appreciated, with some interrupting saccades.  I would debate that patient has occasional evidence for a mix of hypometric and hypermetric saccades when changing targets.  VFF to confrontation. No facial asymmetry. Tongue protrudes midline.  V1-3 intact to light touch and pinprick. SCM and trapezius 5/5 bilaterally.    Motor: symmetric bulk throughout. No fasciculations.     LEFT Delt 5/5 Tricep 5/5 Bicep 5/5 WExt 5/5 WFlex 5/5 Interr 5/5   HpFlex 5/5 QFem 5/5 Hamst 5/5 ADF 5/5 APF 5/5   RIGHT Delt 5/5 Tricep 5/5 Bicep 5/5 WExt 5/5 WFlex 5/5 Interr 5/5   HpFlex 5/5 QFem 5/5 Hamst 5/5 ADF 5/5 APF 5/5    MOVEMENT DISORDERS EXAMINATION:  Facial expression was normal . Volume of speech was normal . Handwriting was not assessed. There was no evidence of rest tremor. There was no evidence of postural tremor. There was no evidence of action tremor. Tone was normal in the neck and tone was normal in the right upper extremity, left upper extremity, right lower extremity and left lower extremity (no paratonias today).  Rapid alternating movements were normal in amplitude and speed with no breakdown in the right upper extremity, left upper extremity, right lower extremity and left lower extremity.  Foot tap was slow bilaterally. He could get up from a low lying chair without help of both hands..  Gait was steady with good heel to toe stride without alternating length of stride.  Arm swing was normal bilaterally with some parakinesias appreciated.     In addition, there were evident choreiform movements of the upper extremities, right upper extremity mildly worse than left upper extremity today, nearly absent in the trunk and lower limbs. There is no evidence of dysdiadochokinesia. There  is no evidence of a milkmaid grip bilaterally today. Able to maintain prolonged tongue protrusion without difficulty.  Patient is unable to perform tandem gait without having to correct 3-4 times.  He is able to stand with feet together and eyes open, but unable to stand eyes open in tandem for over 10 seconds.    Reflexes: flexor plantar responses bilaterally     LEFT Bicep 2 Tricep 2 BrachR 2 Patell 2 AJ 1 Hoffman absent  RIGHT Bicep 2 Tricep 2 BrachR 2 Patell 2 AJ 1 Hoffman absent    A = absent   P = present   B = Brisk     Coordination: Finger-nose testing bilaterally demonstrates no evidence of dysmetria or ataxia.

## 2018-04-07 NOTE — Unmapped (Signed)
INSTRUCTIONS:  1.  After review today, happy to see that increased doses of tetrabenazine were beneficial without causing significant worsening of mood disturbances  2.  Given that previously one-step higher dosing of tetrabenazine caused significant mood changes, we agreed to keep on the current dosing of therapy without changes  3.  In the event that we feel chorea/dyskinesias clearly worsened by next clinic visit, we may need to consider switching therapy to an alternative but similar medication to tetrabenazine in order to reduce side effect profile  4.  Today we spoke about the possibility that your condition may be more consistent with tardive dyskinesia as 1 of the potential toxic conditions that could have caused it in the first place  5.  For now, return to clinic in 9-4 months, or sooner if need be.

## 2018-04-14 ENCOUNTER — Encounter: Payer: Self-pay | Admitting: Internal Medicine

## 2018-04-22 NOTE — Unmapped (Signed)
[facility] Refill Coordination Note    Specialty Medication(s) to be Shipped:   Neurology: tetrabenazine Gary Rogers) 12.5 MG    Other medication(s) to be shipped: none     Gary Rogers, DOB: 10-12-1955  Phone: [phone] (home)       All above HIPAA information was verified with patient.     Completed refill call assessment today to schedule patient's medication shipment from the [facility] [phone]).       Specialty medication(s) and dose(s) confirmed: Regimen is correct and unchanged.   Changes to medications: Gary Rogers reports no changes reported at this time.  Changes to insurance: No  Questions for the pharmacist: No    The patient will receive a drug information handout for each medication shipped and additional FDA Medication Guides as required.      DISEASE/MEDICATION-SPECIFIC INFORMATION        N/A    ADHERENCE     Medication Adherence    Patient reported X missed doses in the last month:  0                          MEDICARE PART B DOCUMENTATION     tetrabenazine (XENAZINE) 12.5 MG: Patient has 8 days worth of on hand.    SHIPPING     Shipping address confirmed in Epic.     Delivery Scheduled: Yes, Expected medication delivery date: 04/25/18 via UPS or courier.     Medication will be delivered via UPS to the home address in [place].    Gary Rogers   [facility] Specialty Technician

## 2018-04-24 MED FILL — TETRABENAZINE 12.5 MG TABLET: 30 days supply | Qty: 90 | Fill #2

## 2018-04-24 MED FILL — TETRABENAZINE 12.5 MG TABLET: 30 days supply | Qty: 90 | Fill #2 | Status: AC

## 2018-05-15 DIAGNOSIS — N183 Chronic kidney disease, stage 3 (moderate): Secondary | ICD-10-CM | POA: Diagnosis not present

## 2018-05-19 NOTE — Unmapped (Signed)
[facility] Refill Coordination Note  Specialty Medication(s): Gary Rogers, DOB: 08-23-1955  Phone: [phone] (home) , Alternate phone contact: N/A  Phone or address changes today?: No  All above HIPAA information was verified with patient.  Shipping Address: [address]  [place] [address]   Insurance changes? No    Completed refill call assessment today to schedule patient's medication shipment from the [facility] [phone]).      Confirmed the medication and dosage are correct and have not changed: Yes, regimen is correct and unchanged.    Confirmed patient started or stopped the following medications in the past month:  No, there are no changes reported at this time.    Are you tolerating your medication?:  Gary Rogers reports tolerating the medication.    ADHERENCE    (Below is required for Medicare Part B or Transplant patients only - per drug):   How many tablets were dispensed last month: 30 DAYS  Patient currently has 12 DAYS remaining.    Did you miss any doses in the past 4 weeks? No missed doses reported.    FINANCIAL/SHIPPING    Delivery Scheduled: Yes, Expected medication delivery date: 05/28/2018     Medication will be delivered via UPS to the home address in [facility].    The patient will receive a drug information handout for each medication shipped and additional FDA Medication Guides as required.      Gary Rogers did not have any additional questions at this time.    We will follow up with patient monthly for standard refill processing and delivery.      Thank you,  [name]   [facility] Specialty Technician

## 2018-05-20 DIAGNOSIS — Z940 Kidney transplant status: Secondary | ICD-10-CM | POA: Diagnosis not present

## 2018-05-23 DIAGNOSIS — E1142 Type 2 diabetes mellitus with diabetic polyneuropathy: Secondary | ICD-10-CM | POA: Diagnosis not present

## 2018-05-23 DIAGNOSIS — B351 Tinea unguium: Secondary | ICD-10-CM | POA: Diagnosis not present

## 2018-05-23 DIAGNOSIS — L851 Acquired keratosis [keratoderma] palmaris et plantaris: Secondary | ICD-10-CM | POA: Diagnosis not present

## 2018-05-27 NOTE — Unmapped (Signed)
Gary Rogers 's TETRABENAZINE shipment will be delayed due to Cost Increase We have contacted the patient and left a message We will call the patient to reschedule the delivery upon resolution. We have confirmed the delivery date as N/A .

## 2018-06-02 MED FILL — TETRABENAZINE 12.5 MG TABLET: 30 days supply | Qty: 90 | Fill #3 | Status: AC

## 2018-06-02 MED FILL — TETRABENAZINE 12.5 MG TABLET: 30 days supply | Qty: 90 | Fill #3

## 2018-06-02 NOTE — Unmapped (Signed)
Patient confirmed delivery on 2/10 via [employer] [facility].

## 2018-06-05 DIAGNOSIS — Z940 Kidney transplant status: Secondary | ICD-10-CM | POA: Diagnosis not present

## 2018-06-05 DIAGNOSIS — N183 Chronic kidney disease, stage 3 (moderate): Secondary | ICD-10-CM | POA: Diagnosis not present

## 2018-06-16 DIAGNOSIS — E1165 Type 2 diabetes mellitus with hyperglycemia: Secondary | ICD-10-CM | POA: Diagnosis not present

## 2018-06-16 DIAGNOSIS — E119 Type 2 diabetes mellitus without complications: Secondary | ICD-10-CM | POA: Diagnosis not present

## 2018-06-16 DIAGNOSIS — E1169 Type 2 diabetes mellitus with other specified complication: Secondary | ICD-10-CM | POA: Diagnosis not present

## 2018-06-16 DIAGNOSIS — E039 Hypothyroidism, unspecified: Secondary | ICD-10-CM | POA: Diagnosis not present

## 2018-06-16 DIAGNOSIS — E782 Mixed hyperlipidemia: Secondary | ICD-10-CM | POA: Diagnosis not present

## 2018-06-16 DIAGNOSIS — N189 Chronic kidney disease, unspecified: Secondary | ICD-10-CM | POA: Diagnosis not present

## 2018-06-19 DIAGNOSIS — D649 Anemia, unspecified: Secondary | ICD-10-CM | POA: Diagnosis not present

## 2018-06-19 DIAGNOSIS — N183 Chronic kidney disease, stage 3 (moderate): Secondary | ICD-10-CM | POA: Diagnosis not present

## 2018-06-19 DIAGNOSIS — E039 Hypothyroidism, unspecified: Secondary | ICD-10-CM | POA: Diagnosis not present

## 2018-06-19 DIAGNOSIS — E782 Mixed hyperlipidemia: Secondary | ICD-10-CM | POA: Diagnosis not present

## 2018-06-19 DIAGNOSIS — E46 Unspecified protein-calorie malnutrition: Secondary | ICD-10-CM | POA: Diagnosis not present

## 2018-06-19 DIAGNOSIS — E875 Hyperkalemia: Secondary | ICD-10-CM | POA: Diagnosis not present

## 2018-06-19 DIAGNOSIS — N529 Male erectile dysfunction, unspecified: Secondary | ICD-10-CM | POA: Diagnosis not present

## 2018-06-19 DIAGNOSIS — E1169 Type 2 diabetes mellitus with other specified complication: Secondary | ICD-10-CM | POA: Diagnosis not present

## 2018-06-19 DIAGNOSIS — Z7409 Other reduced mobility: Secondary | ICD-10-CM | POA: Diagnosis not present

## 2018-06-25 NOTE — Unmapped (Signed)
[facility] Refill Coordination Note    Specialty Medication(s) to be Shipped:   Neurology: Jannifer Rodney and [name]    Other medication(s) to be shipped: N/A     [name], DOB: May 29, 1955  Phone: [phone] (home)       All above HIPAA information was verified with patient.     Completed refill call assessment today to schedule patient's medication shipment from the [facility] [phone]).       Specialty medication(s) and dose(s) confirmed: Regimen is correct and unchanged.   Changes to medications: Coby reports no changes reported at this time.  Changes to insurance: No  Questions for the pharmacist: No    The patient will receive a drug information handout for each medication shipped and additional FDA Medication Guides as required.      DISEASE/MEDICATION-SPECIFIC INFORMATION        N/A    ADHERENCE     Medication Adherence    Patient reported X missed doses in the last month:  0  Specialty Medication:  xenazine  Patient is on additional specialty medications:  No  Patient is on more than two specialty medications:  No  Any gaps in refill history greater than 2 weeks in the last 3 months:  no              MEDICARE PART B DOCUMENTATION     xenezine: Patient has 1 week on hand.    SHIPPING     Shipping address confirmed in Epic.     Delivery Scheduled: Yes, Expected medication delivery date: 06/27/2018 via UPS or courier.     Medication will be delivered via UPS to the home address in [place].    Cadynce Garrette Perlie Gold   [facility] Specialty Technician

## 2018-06-26 MED FILL — TETRABENAZINE 12.5 MG TABLET: 30 days supply | Qty: 90 | Fill #4

## 2018-06-26 MED FILL — TETRABENAZINE 12.5 MG TABLET: 30 days supply | Qty: 90 | Fill #4 | Status: AC

## 2018-07-01 ENCOUNTER — Ambulatory Visit: Payer: Medicare Other | Admitting: Nurse Practitioner

## 2018-07-08 DIAGNOSIS — E875 Hyperkalemia: Secondary | ICD-10-CM | POA: Diagnosis not present

## 2018-07-08 DIAGNOSIS — Z940 Kidney transplant status: Secondary | ICD-10-CM | POA: Diagnosis not present

## 2018-07-08 DIAGNOSIS — N189 Chronic kidney disease, unspecified: Secondary | ICD-10-CM | POA: Diagnosis not present

## 2018-07-18 NOTE — Unmapped (Signed)
[facility] Refill Coordination Note    Specialty Medication(s) to be Shipped:   Neurology: Gary Rogers    Other medication(s) to be shipped: n/a     Gary Rogers, DOB: 1955-05-09  Phone: [phone] (home)       All above HIPAA information was verified with patient.     Completed refill call assessment today to schedule patient's medication shipment from the [facility] [phone]).       Specialty medication(s) and dose(s) confirmed: Regimen is correct and unchanged.   Changes to medications: Gary Rogers reports no changes reported at this time.  Changes to insurance: No  Questions for the pharmacist: No    Confirmed patient received Welcome Packet with first shipment. The patient will receive a drug information handout for each medication shipped and additional FDA Medication Guides as required.       DISEASE/MEDICATION-SPECIFIC INFORMATION        N/A    SPECIALTY MEDICATION ADHERENCE     Medication Adherence    Patient reported X missed doses in the last month:  0  Specialty Medication:  Xenazine 12.5  Patient is on additional specialty medications:  No  Patient is on more than two specialty medications:  No  Any gaps in refill history greater than 2 weeks in the last 3 months:  no  Demonstrates understanding of importance of adherence:  yes  Informant:  patient                Gary Rogers 12.5mg : Patient has 14 days of medication on hand      SHIPPING     Shipping address confirmed in Epic.     Delivery Scheduled: Yes, Expected medication delivery date: 07/29/18.     Medication will be delivered via UPS to the home address in Epic [place].    [name]   [facility] Specialty Technician

## 2018-07-28 MED FILL — TETRABENAZINE 12.5 MG TABLET: 30 days supply | Qty: 90 | Fill #5 | Status: AC

## 2018-07-28 MED FILL — TETRABENAZINE 12.5 MG TABLET: 30 days supply | Qty: 90 | Fill #5

## 2018-08-01 DIAGNOSIS — E1142 Type 2 diabetes mellitus with diabetic polyneuropathy: Secondary | ICD-10-CM | POA: Diagnosis not present

## 2018-08-01 DIAGNOSIS — B351 Tinea unguium: Secondary | ICD-10-CM | POA: Diagnosis not present

## 2018-08-22 NOTE — Unmapped (Signed)
[facility] Refill Coordination Note    Specialty Medication(s) to be Shipped:   Neurology: Gary Rogers    Other medication(s) to be shipped: n/a     Gary Rogers, DOB: 29-May-1955  Phone: [phone] (home)       All above HIPAA information was verified with patient.     Completed refill call assessment today to schedule patient's medication shipment from the [facility] [phone]).       Specialty medication(s) and dose(s) confirmed: Regimen is correct and unchanged.   Changes to medications: Gary Rogers reports no changes at this time.  Changes to insurance: No  Questions for the pharmacist: No    Confirmed patient received Welcome Packet with first shipment. The patient will receive a drug information handout for each medication shipped and additional FDA Medication Guides as required.       DISEASE/MEDICATION-SPECIFIC INFORMATION        N/A    SPECIALTY MEDICATION ADHERENCE     Medication Adherence    Patient reported X missed doses in the last month:  0  Specialty Medication:  Gary Rogers  Patient is on additional specialty medications:  No  Patient is on more than two specialty medications:  No  Any gaps in refill history greater than 2 weeks in the last 3 months:  no  Demonstrates understanding of importance of adherence:  yes  Informant:  patient                Xenazine: Patient has 10 days of medication on hand      SHIPPING     Shipping address confirmed in Epic.     Delivery Scheduled: Yes, Expected medication delivery date: 08/28/18.     Medication will be delivered via UPS to the home address in Epic [place].    Gary Rogers   [facility] Specialty Technician

## 2018-08-27 MED FILL — TETRABENAZINE 12.5 MG TABLET: 30 days supply | Qty: 90 | Fill #6

## 2018-08-27 MED FILL — TETRABENAZINE 12.5 MG TABLET: 30 days supply | Qty: 90 | Fill #6 | Status: AC

## 2018-09-02 ENCOUNTER — Telehealth: Payer: Self-pay

## 2018-09-02 ENCOUNTER — Encounter: Payer: Self-pay | Admitting: Internal Medicine

## 2018-09-02 ENCOUNTER — Other Ambulatory Visit: Payer: Self-pay

## 2018-09-02 ENCOUNTER — Ambulatory Visit (INDEPENDENT_AMBULATORY_CARE_PROVIDER_SITE_OTHER): Payer: Medicare Other | Admitting: Nurse Practitioner

## 2018-09-02 ENCOUNTER — Encounter: Payer: Self-pay | Admitting: Nurse Practitioner

## 2018-09-02 DIAGNOSIS — Z86010 Personal history of colonic polyps: Secondary | ICD-10-CM

## 2018-09-02 DIAGNOSIS — B182 Chronic viral hepatitis C: Secondary | ICD-10-CM

## 2018-09-02 DIAGNOSIS — R197 Diarrhea, unspecified: Secondary | ICD-10-CM | POA: Diagnosis not present

## 2018-09-02 NOTE — Assessment & Plan Note (Signed)
Since stopping exercising due to COVID-19/coronavirus pandemic and subsequent state mandated stay at home order the patient has begun having diarrhea about once a day or less.  Typically has 3 stools a day and most stools are formed and soft consistently Bristol 4.  He feels that his diarrhea is due to the decreased physical activity.  Typically occurs in the evenings.  I recommended he take Imodium as needed, can consider 1 dose a day in the evenings to prevent diarrhea.  Continue other medications otherwise.  He is due for colonoscopy coming up which will evaluate for any other etiologies behind his diarrhea that would be apparent on colonoscopy.

## 2018-09-02 NOTE — Patient Instructions (Signed)
Your health issues we discussed today were:   Hepatitis C infection: 1. Congratulations on achieving "cured" of your hepatitis C infection vaccination point 2. There was not significant enough damage to your liver by the virus to warrant continued liver follow-up 3. Call us if you have any questions about this or contact the [facility] liver clinic in [place], [place]  Diarrhea: 1. I doubt any infection causing diarrhea due to the rare diarrhea that you are having 2. You are due for colonoscopy regardless which will help further evaluate 3. I feel your diarrhea is likely due to lifestyle changes after stay at home order related to COVID-19/coronavirus pandemic 4. As we discussed, you can try taking Imodium once in the evening before bed to prevent diarrhea 5. Call us for any worsening symptoms  Need for colonoscopy: 1. You are due this year for colonoscopy. 2. We will schedule your procedure for you.  It would likely be in a couple months when there is decreased issues with COVID-19/coronavirus due to your immunocompromise state 3. As an [place], [facility] is starting elective procedures with patients being tested for COVID-19 first and this may be requirement prior to your colonoscopy  Overall I recommend:  1. Continue your other medications 2. Follow-up in 6 months 3. Call us if you have any questions or concerns.   Because of recent events of COVID-19 ("Coronavirus"), follow CDC recommendations:  1. Wash your hand frequently 2. Avoid touching your face 3. Stay away from people who are sick 4. If you have symptoms such as fever, cough, shortness of breath then call your healthcare provider for further guidance 5. If you are sick, STAY AT HOME unless otherwise directed by your healthcare provider. 6. Follow directions from state and national officials regarding staying safe    At [facility] we value your feedback. You may receive a survey about your visit today.  Please share your experience as we strive to create trusting relationships with our patients to provide genuine, compassionate, quality care.  We appreciate your understanding and patience as we review any laboratory studies, imaging, and other diagnostic tests that are ordered as we care for you. Our office policy is 5 business days for review of these results, and any emergent or urgent results are addressed in a timely manner for your best interest. If you do not hear from our office in 1 week, please contact us.   We also encourage the use of MyChart, which contains your medical information for your review as well. If you are not enrolled in this feature, an access code is on this after visit summary for your convenience. Thank you for allowing [place] to be involved in your care.  It was great to see you today!  I hope you have a great day!!

## 2018-09-02 NOTE — Progress Notes (Signed)
Referring Provider: Celene Squibb, MD Primary Care Physician:  Celene Squibb, MD Primary GI:  Dr. Gala Romney  NOTE: Service was provided via telemedicine and was requested by the patient due to COVID-19 pandemic.  Method of visit: Doxy.Me  Patient Location: Home  Provider Location: Office  Reason for Phone Visit: Follow-up  The patient was consented to phone follow-up via telephone encounter including billing of the encounter (yes/no): Yes  Persons present on the phone encounter, with roles: None  Total time (minutes) spent on medical discussion: 18 minutes  Chief Complaint  Patient presents with  . Follow-up    6 mo f/u,Hep C,Diarrhea    HPI:   Nathan Tyler is a 63 y.o. male who presents for virtual visit regarding: Follow-up on chronic hepatitis C.  The patient was last seen in our office on 09/09/2017 for history of colon polyps and chronic hepatitis C.  At his last visit noted he had been treated by the [facility] liver care clinic with Roosevelt Locks, NP with 12 weeks of [place] and had posttreatment negative HCVRNA.  Plans to follow-up for 12-week posttreatment labs as well as liver ultrasound to determine if ongoing liver care needed.  No GI concerns at his last visit.  Last colonoscopy in [place] in October 2015 and due in October 2020.  Cannot recall the name of the facility where his last procedure was completed.  Reviewed scan note from [facility] liver care clinic.  Prior to treatment he had F2 fibrosis on ultrasound elastography.  Status post living donor kidney transplant in 2010.  At that time noted he achieved SVR and cure.  Plan for right upper quadrant ultrasound and any follow-up based on ultrasound results.  Right upper quadrant ultrasound completed 01/06/2018 which found increased hepatic echotexture compatible with fatty infiltrative change but no suspicious masses.  It appears he has no need for ongoing liver care.  Today he states he's doing well overall. Due for colonoscopy Oct  2020. Denies abdominal pain, N/V, hematochezia, melena, fever, chills, unintentional weight loss. Has been having diarrhea for the last 2-3 months, having 3 stools a day about 1 out of 4 or 5 is loose. Feels the diarrhea is usually in the evening, some incontinence noted. Stools is loose, not watery. No abdominal pain with that. Other stools are consistent with Bristol 4. This is a new stool pattern for him, thinks it started when he stopped working out (due to state stay-at-home order). Denies URI and flu-like symptoms. Denies chest pain, dyspnea, dizziness, lightheadedness, syncope, near syncope. Denies any other upper or lower GI symptoms.  No new Rx medication sother than Lasix. No new OTC supplements or herbs.  Past Medical History:  Diagnosis Date  . Chorea   . Diabetes mellitus without complication ([place])   . Hypercholesteremia   . Hypothyroidism   . Neuromuscular disorder ([place])   . PONV (postoperative nausea and vomiting)   . Renal failure    Has had kidney transplant 2008    Past Surgical History:  Procedure Laterality Date  . AMPUTATION Right 06/27/2016   Procedure: AMPUTATION 5TH TOE RIGHT FOOT;  Surgeon: Caprice Beaver, DPM;  Location: [facility];  Service: Podiatry;  Laterality: Right;  . AMPUTATION TOE Left 09/02/2015   Procedure: AMPUTATION  2ND TOE LEFT FOOT;  Surgeon: Aviva Signs, MD;  Location: [facility];  Service: General;  Laterality: Left;  . CATARACT EXTRACTION Bilateral   . COLONOSCOPY  01/2014   done in [place]. patient reports h/o  colon polyps on TCS in 2010, 2015 colonoscopy normal but advised to come back in 2020.   Marland Kitchen KIDNEY TRANSPLANT Right 2010    Current Outpatient Medications  Medication Sig Dispense Refill  . Dulaglutide (TRULICITY) 1.5 TF/5.7DU SOPN Inject into the skin.    . furosemide (LASIX) 20 MG tablet Take 20 mg by mouth.    . levothyroxine (SYNTHROID, LEVOTHROID) 100 MCG tablet Take 100 mcg by mouth daily before breakfast.    . lisinopril  (PRINIVIL,ZESTRIL) 10 MG tablet Take 20 mg by mouth daily.     . mycophenolate (CELLCEPT) 500 MG tablet Take 500 mg by mouth 2 (two) times daily.    . predniSONE (DELTASONE) 5 MG tablet Take 5 mg by mouth daily with breakfast.    . tacrolimus (PROGRAF) 0.5 MG capsule Take 1 mg by mouth 2 (two) times daily.     Marland Kitchen tetrabenazine (XENAZINE) 12.5 MG tablet Take 12.5 mg by mouth 2 (two) times daily. Takes 2 tablets in the morning and 1 tablet at night.    . TRESIBA FLEXTOUCH 200 UNIT/ML SOPN Inject 30 Units daily into the skin.      No current facility-administered medications for this visit.     Allergies as of 09/02/2018  . (No Known Allergies)    Family History  Problem Relation Age of Onset  . Colon cancer Maternal Aunt 75       passed from [facility]    Social History   Socioeconomic History  . Marital status: Widowed    Spouse name: Not on file  . Number of children: Not on file  . Years of education: Not on file  . Highest education level: Not on file  Occupational History  . Not on file  Social Needs  . Financial resource strain: Not on file  . Food insecurity:    Worry: Not on file    Inability: Not on file  . Transportation needs:    Medical: Not on file    Non-medical: Not on file  Tobacco Use  . Smoking status: Never Smoker  . Smokeless tobacco: Never Used  Substance and Sexual Activity  . Alcohol use: Yes    Alcohol/week: 0.0 standard drinks    Comment: As of 09/02/2018: Glass of wine 2-3 times a month  . Drug use: No    Comment: None currently (04/26/17); History of IV drugs 11/2014 - 04/2015  . Sexual activity: Yes    Birth control/protection: None  Lifestyle  . Physical activity:    Days per week: Not on file    Minutes per session: Not on file  . Stress: Not on file  Relationships  . Social connections:    Talks on phone: Not on file    Gets together: Not on file    Attends religious service: Not on file    Active member of club or organization: Not on file     Attends meetings of clubs or organizations: Not on file    Relationship status: Not on file  Other Topics Concern  . Not on file  Social History Narrative  . Not on file    Review of Systems: General: Negative for anorexia, weight loss, fever, chills, fatigue, weakness. ENT: Negative for hoarseness, difficulty swallowing. CV: Negative for chest pain, angina, palpitations, peripheral edema.  Respiratory: Negative for dyspnea at rest, cough, sputum, wheezing.  GI: See history of present illness. Endo: Negative for unusual weight change.  Heme: Negative for bruising or bleeding. Allergy: Negative for rash  or hives.  Physical Exam: Note: limited exam due to virtual visit General:   Alert and oriented. Pleasant and cooperative. Well-nourished and well-developed.  Head:  Normocephalic and atraumatic. Eyes:  Without icterus, sclera clear and conjunctiva pink.  Ears:  Normal auditory acuity. Skin:  Intact without facial significant lesions or rashes. Neurologic:  Alert and oriented x4;  grossly normal neurologically. Psych:  Alert and cooperative. Normal mood and affect. Heme/Lymph/Immune: No excessive bruising noted.

## 2018-09-02 NOTE — Assessment & Plan Note (Signed)
History of colon polyps and the patient is due by October of this year for colonoscopy.  Given that we are seeing him today we will go ahead and schedule his procedure on a nonurgent/nonemergent basis.  Previous colonoscopy completed 2015 and in [place], difficulty obtaining report.  We will proceed at this time and recommend follow-up in 6 months.  Proceed with TCS on propofol/MAC with Dr. Gala Romney in near future: the risks, benefits, and alternatives have been discussed with the patient in detail. The patient states understanding and desires to proceed.  The patient is not on any anticoagulants, anxiolytics, chronic pain medications, or antidepressants.  He does have a history of IV drug use.  Social alcohol about once a week.  Due to his history we will plan for the procedure on propofol/MAC to ensure adequate sedation.  Of note, the patient is immunocompromised on CellCept and Prograf due to history of kidney transplant.  It would be best to schedule him a couple months down the road when coronavirus is likely to be under better control.

## 2018-09-02 NOTE — Telephone Encounter (Signed)
Noted. Will call pt to schedule when September schedule is available.

## 2018-09-02 NOTE — Telephone Encounter (Signed)
Called pt to schedule TCS w/Propofol w/RMR. Pt states he's not due for TCS until October. He wants to wait until September to have his procedure.  Will he need another OV to schedule TCS in September?

## 2018-09-02 NOTE — Telephone Encounter (Signed)
no

## 2018-09-02 NOTE — Progress Notes (Signed)
cc'ed to pcp °

## 2018-09-02 NOTE — Assessment & Plan Note (Signed)
Previous hepatitis C infection treated at the [facility] liver clinic in [place], [place].  He has documented sustained SVR based on 12-week posttreatment labs.  Elastography essentially F2 with fatty infiltration but no cirrhosis.  No need for ongoing liver care.  He was congratulated for his efforts but cautioned against the possibility of reinfection.  Also reemphasized to him that hepatitis C antibody will always be positive.

## 2018-09-09 NOTE — Unmapped (Signed)
Call went directly to voicemail x2 on call attempt number 1. Left voicemail with clinic call back number and will attempt to call patient back in 48 hours.    Lorenso Courier, PharmD  PGY1 Acute Care Pharmacy Resident   Pager: [phone]    Worthy Flank, PharmD, CPP  Clinical Pharmacist, [facility]  Phone: [phone]

## 2018-09-11 NOTE — Unmapped (Signed)
Called to conduct specialty clinical assessment for Cellcept but patient did not answer. Left message with clinic call back number. Will attempt 3rd call attempt 09/16/18    Lorenso Courier, PharmD  PGY1 Acute Care Pharmacy Resident     Worthy Flank, PharmD, CPP  Clinical Pharmacist, [facility]  Phone: [phone]

## 2018-09-16 NOTE — Unmapped (Signed)
Patient returned phone call and left message however patient was not able to be reached after 3rd call attempt. Phone was going directly to voicemail without ringing so attempted to call x4 but ultimately left a voicemail requesting that patient return call to clinic. Will attempt to make contact with patient one additional time since patient's returned call was missed.    Lorenso Courier, PharmD  PGY1 Acute Care Pharmacy Resident     Worthy Flank, PharmD, CPP  Clinical Pharmacist, [facility]  Phone: [phone]

## 2018-09-18 MED ORDER — TETRABENAZINE 12.5 MG TABLET
ORAL_TABLET | ORAL | 6 refills | 30 days | Status: CP
Start: 2018-09-18 — End: ?
  Filled 2018-09-25: qty 90, 30d supply, fill #0

## 2018-09-18 NOTE — Unmapped (Addendum)
Final call attempt with no answer. Left message with clinic call back number.    Lorenso Courier, PharmD  PGY1 Acute Care Pharmacy Resident     Worthy Flank, PharmD, CPP  Clinical Pharmacist, [facility]  Phone: [phone]

## 2018-09-18 NOTE — Unmapped (Signed)
[facility] Specialty Pharmacy Refill Coordination Note    Specialty Medication(s) to be Shipped:   Neurology: [name]    Other medication(s) to be shipped: n/a     Gary Rogers, DOB: 1955-06-05  Phone: [phone] (home)       All above HIPAA information was verified with patient.     Completed refill call assessment today to schedule patient's medication shipment from the [facility] [phone]).       Specialty medication(s) and dose(s) confirmed: Regimen is correct and unchanged.   Changes to medications: Gary Rogers reports no changes at this time.  Changes to insurance: No  Questions for the pharmacist: No    Confirmed patient received Welcome Packet with first shipment. The patient will receive a drug information handout for each medication shipped and additional FDA Medication Guides as required.       DISEASE/MEDICATION-SPECIFIC INFORMATION        N/A    SPECIALTY MEDICATION ADHERENCE     Medication Adherence    Patient reported X missed doses in the last month:  0  Specialty Medication:  Xenazine 12.5  Patient is on additional specialty medications:  No  Patient is on more than two specialty medications:  No  Any gaps in refill history greater than 2 weeks in the last 3 months:  no  Demonstrates understanding of importance of adherence:  yes  Informant:  patient              Xenazine 12.5mg : Patient has 14 days of medication on hand       SHIPPING     Shipping address confirmed in Epic.     Delivery Scheduled: Yes, Expected medication delivery date: 09/26/18.     Medication will be delivered via UPS to the home address in Epic WAM.    [name]   [facility] Specialty Technician

## 2018-09-22 ENCOUNTER — Other Ambulatory Visit: Payer: Self-pay | Admitting: *Deleted

## 2018-09-22 ENCOUNTER — Telehealth: Payer: Self-pay | Admitting: *Deleted

## 2018-09-22 DIAGNOSIS — Z86010 Personal history of colonic polyps: Secondary | ICD-10-CM

## 2018-09-22 MED ORDER — PEG 3350-KCL-NA BICARB-NACL 420 G PO SOLR: 4000.0000 mL | Freq: Once | ORAL | 0 refills | Status: AC

## 2018-09-22 NOTE — Telephone Encounter (Signed)
Spoke with patient and he is scheduled for TCS with propofol with RMR on 9/28 at 9:45am. Patient aware will need pre-op appt. I will mail this with his instructions (confirmed address). He request Rx be sent to [facility]. Rx sent. Orders entered.

## 2018-09-23 NOTE — Telephone Encounter (Signed)
Pre-op letter mailed

## 2018-09-25 MED FILL — TETRABENAZINE 12.5 MG TABLET: 30 days supply | Qty: 90 | Fill #0 | Status: AC

## 2018-10-08 DIAGNOSIS — N189 Chronic kidney disease, unspecified: Secondary | ICD-10-CM | POA: Diagnosis not present

## 2018-10-08 DIAGNOSIS — E039 Hypothyroidism, unspecified: Secondary | ICD-10-CM | POA: Diagnosis not present

## 2018-10-08 DIAGNOSIS — Z940 Kidney transplant status: Secondary | ICD-10-CM | POA: Diagnosis not present

## 2018-10-08 DIAGNOSIS — E1169 Type 2 diabetes mellitus with other specified complication: Secondary | ICD-10-CM | POA: Diagnosis not present

## 2018-10-08 DIAGNOSIS — E782 Mixed hyperlipidemia: Secondary | ICD-10-CM | POA: Diagnosis not present

## 2018-10-08 DIAGNOSIS — E875 Hyperkalemia: Secondary | ICD-10-CM | POA: Diagnosis not present

## 2018-10-08 DIAGNOSIS — E1165 Type 2 diabetes mellitus with hyperglycemia: Secondary | ICD-10-CM | POA: Diagnosis not present

## 2018-10-10 DIAGNOSIS — Z0000 Encounter for general adult medical examination without abnormal findings: Secondary | ICD-10-CM | POA: Diagnosis not present

## 2018-10-14 NOTE — Unmapped (Signed)
[facility] Pharm[facility] Clinical Assessment & Refill Coordination Note    Gary Rogers, : [birth year]  Phone: [phone] (home)     All above HIPAA information was verified with patient.     Specialty Medication(s):   Neurology: [name]     Current Outpatient Medications   Medication Sig Dispense Refill   ??? dulaglutide (TRULICITY) 1.5 mg/0.5 mL PnIj Inject 1.5 mg under the skin every seven (7) days.     ??? furosemide (LASIX) 20 MG tablet Take 20 mg by mouth Two (2) times a day.     ??? insulin degludec (TRESIBA FLEXTOUCH U-200) 200 unit/mL (3 mL) InPn Inject under the skin.     ??? levothyroxine (SYNTHROID, LEVOTHROID) 100 MCG tablet Take 100 mcg by mouth daily at 0600.     ??? lisinopril (PRINIVIL,ZESTRIL) 10 MG tablet Take 20 mg by mouth daily at 0600.   3   ??? mycophenolate (CELLCEPT) 500 mg tablet Take by mouth Two (2) times a day.     ??? patiromer calcium sorbitex (VELTASSA) 8.4 gram powder packet Take 8.4 g by mouth daily.     ??? predniSONE (DELTASONE) 5 MG tablet Take 5 mg by mouth daily.     ??? tacrolimus (PROGRAF) 0.5 MG capsule Take 0.5 mg by mouth Two (2) times a day. TAKE 4 CAPSULES BY MOUTH TWICE DAILY     ??? tetrabenazine (XENAZINE) 12.5 MG tablet Take 2 tablets (25 mg total) by mouth every morning AND 1 tablet (12.5 mg total) every evening. 90 tablet 6     No current facility-administered medications for this visit.         Changes to medications: Gary Rogers reports no changes at this time.    No Known Allergies    Changes to allergies: No    SPECIALTY MEDICATION ADHERENCE     Tetrabenazine 12.5 mg: 14 days of medicine on hand          Specialty medication(s) dose(s) confirmed: Regimen is correct and unchanged.     Are there any concerns with adherence? No    Adherence counseling provided? Not needed    CLINICAL MANAGEMENT AND INTERVENTION      Clinical Benefit Assessment:    Do you feel the medicine is effective or helping your condition? Yes    Clinical Benefit counseling provided? Not needed    Adverse Effects Assessment:    Are you experiencing any side effects? No    Are you experiencing difficulty administering your medicine? No    Quality of Life Assessment:    How many days over the past month did your Chorea  keep you from your normal activities? For example, brushing your teeth or getting up in the morning. Patient declined to answer    Have you discussed this with your provider? Not needed    Therapy Appropriateness:    Is therapy appropriate? Yes, therapy is appropriate and should be continued    DISEASE/MEDICATION-SPECIFIC INFORMATION      N/A    PATIENT SPECIFIC NEEDS     ? Does the patient have any physical, cognitive, or cultural barriers? No    ? Is the patient high risk? No     ? Does the patient require a Care Management Plan? No     ? Does the patient require physician intervention or other additional services (i.e. nutrition, smoking cessation, social work)? No      SHIPPING     Specialty Medication(s) to be Shipped:   Neurology:  Xenazine    Other medication(s) to be shipped: n/a     Changes to insurance: No    Delivery Scheduled: Yes, Expected medication delivery date: 10/29/18.     Medication will be delivered via UPS to the confirmed home address in [facility].    The patient will receive a drug information handout for each medication shipped and additional FDA Medication Guides as required.  Verified that patient has previously received a Conservation officer, historic buildings.    All of the patient's questions and concerns have been addressed.    Phiona Ramnauth Vangie Bicker   [facility] Specialty Pharmacist

## 2018-10-16 DIAGNOSIS — E1169 Type 2 diabetes mellitus with other specified complication: Secondary | ICD-10-CM | POA: Diagnosis not present

## 2018-10-16 DIAGNOSIS — N529 Male erectile dysfunction, unspecified: Secondary | ICD-10-CM | POA: Diagnosis not present

## 2018-10-16 DIAGNOSIS — Z8619 Personal history of other infectious and parasitic diseases: Secondary | ICD-10-CM | POA: Diagnosis not present

## 2018-10-16 DIAGNOSIS — E875 Hyperkalemia: Secondary | ICD-10-CM | POA: Diagnosis not present

## 2018-10-16 DIAGNOSIS — E039 Hypothyroidism, unspecified: Secondary | ICD-10-CM | POA: Diagnosis not present

## 2018-10-16 DIAGNOSIS — D649 Anemia, unspecified: Secondary | ICD-10-CM | POA: Diagnosis not present

## 2018-10-16 DIAGNOSIS — Z136 Encounter for screening for cardiovascular disorders: Secondary | ICD-10-CM | POA: Diagnosis not present

## 2018-10-16 DIAGNOSIS — E46 Unspecified protein-calorie malnutrition: Secondary | ICD-10-CM | POA: Diagnosis not present

## 2018-10-16 DIAGNOSIS — E782 Mixed hyperlipidemia: Secondary | ICD-10-CM | POA: Diagnosis not present

## 2018-10-16 DIAGNOSIS — Z7409 Other reduced mobility: Secondary | ICD-10-CM | POA: Diagnosis not present

## 2018-10-16 DIAGNOSIS — N183 Chronic kidney disease, stage 3 (moderate): Secondary | ICD-10-CM | POA: Diagnosis not present

## 2018-10-29 MED FILL — TETRABENAZINE 12.5 MG TABLET: 30 days supply | Qty: 90 | Fill #1 | Status: AC

## 2018-10-29 MED FILL — TETRABENAZINE 12.5 MG TABLET: ORAL | 30 days supply | Qty: 90 | Fill #1

## 2018-10-31 DIAGNOSIS — B351 Tinea unguium: Secondary | ICD-10-CM | POA: Diagnosis not present

## 2018-10-31 DIAGNOSIS — E1142 Type 2 diabetes mellitus with diabetic polyneuropathy: Secondary | ICD-10-CM | POA: Diagnosis not present

## 2018-11-25 NOTE — Unmapped (Signed)
[facility] Specialty Pharmacy Refill Coordination Note    Specialty Medication(s) to be Shipped:   Neurology: Gary Rogers    Other medication(s) to be shipped: n/a     Gary Rogers, DOB: 23-Jan-1956  Phone: [phone] (home)       All above HIPAA information was verified with patient.     Completed refill call assessment today to schedule patient's medication shipment from the [facility] [phone]).       Specialty medication(s) and dose(s) confirmed: Regimen is correct and unchanged.   Changes to medications: Morley reports no changes at this time.  Changes to insurance: No  Questions for the pharmacist: No    Confirmed patient received Welcome Packet with first shipment. The patient will receive a drug information handout for each medication shipped and additional FDA Medication Guides as required.       DISEASE/MEDICATION-SPECIFIC INFORMATION        N/A    SPECIALTY MEDICATION ADHERENCE     Medication Adherence    Patient reported X missed doses in the last month: 0  Specialty Medication: Gary Rogers  Patient is on additional specialty medications: No  Patient is on more than two specialty medications: No  Any gaps in refill history greater than 2 weeks in the last 3 months: no  Demonstrates understanding of importance of adherence: yes  Informant: patient                Gary Rogers 12.5mg : Patient has 10 days of medication on hand       SHIPPING     Shipping address confirmed in Epic.     Delivery Scheduled: Yes, Expected medication delivery date: 12/02/18.     Medication will be delivered via UPS to the home address in Epic WAM.    [name]   [facility] Specialty Technician

## 2018-12-01 MED FILL — TETRABENAZINE 12.5 MG TABLET: ORAL | 30 days supply | Qty: 90 | Fill #2

## 2018-12-01 MED FILL — TETRABENAZINE 12.5 MG TABLET: 30 days supply | Qty: 90 | Fill #2 | Status: AC

## 2018-12-23 NOTE — Unmapped (Signed)
[facility] Refill Coordination Note    Specialty Medication(s) to be Shipped:   Neurology: Gary Rogers    Other medication(s) to be shipped: n/a     Gary Rogers, DOB: [birth year]  Phone: [phone] (home)       All above HIPAA information was verified with patient.     Completed refill call assessment today to schedule patient's medication shipment from the [facility] [phone]).       Specialty medication(s) and dose(s) confirmed: Regimen is correct and unchanged.   Changes to medications: [name] reports no changes at this time.  Changes to insurance: No  Questions for the pharmacist: No    Confirmed patient received Welcome Packet with first shipment. The patient will receive a drug information handout for each medication shipped and additional FDA Medication Guides as required.       DISEASE/MEDICATION-SPECIFIC INFORMATION        N/A    SPECIALTY MEDICATION ADHERENCE     Medication Adherence    Patient reported X missed doses in the last month: 0  Specialty Medication: Gary Rogers  Patient is on additional specialty medications: No  Patient is on more than two specialty medications: No  Any gaps in refill history greater than 2 weeks in the last 3 months: no  Demonstrates understanding of importance of adherence: yes  Informant: patient                Gary Rogers 12.5mg : Patient has 12 days of medication on hand      SHIPPING     Shipping address confirmed in Epic.     Delivery Scheduled: Yes, Expected medication delivery date: 01/01/19.     Medication will be delivered via UPS to the home address in Epic WAM.    [name]   [facility] Specialty Technician

## 2018-12-31 MED FILL — TETRABENAZINE 12.5 MG TABLET: ORAL | 30 days supply | Qty: 90 | Fill #3

## 2018-12-31 MED FILL — TETRABENAZINE 12.5 MG TABLET: 30 days supply | Qty: 90 | Fill #3 | Status: AC

## 2019-01-07 DIAGNOSIS — E113293 Type 2 diabetes mellitus with mild nonproliferative diabetic retinopathy without macular edema, bilateral: Secondary | ICD-10-CM | POA: Diagnosis not present

## 2019-01-09 DIAGNOSIS — B351 Tinea unguium: Secondary | ICD-10-CM | POA: Diagnosis not present

## 2019-01-09 DIAGNOSIS — E1142 Type 2 diabetes mellitus with diabetic polyneuropathy: Secondary | ICD-10-CM | POA: Diagnosis not present

## 2019-01-12 NOTE — Patient Instructions (Signed)
Nathan Tyler  01/12/2019     @PREFPERIOPPHARMACY @   Your procedure is scheduled on  01/19/2019 .  Report to [name] at  [place]  A.M.  Call this number if you have problems the morning of surgery:  [phone]   Remember:  Follow the diet and prep instructions given to you by Dr Roseanne Kaufman office.                      Take these medicines the morning of surgery with A SIP OF WATER  Levothyroxine, cellcept, prograf, tetrabenazine.    Do not wear jewelry, make-up or nail polish.  Do not wear lotions, powders, or perfumes. Please wear deodorant and brush your teeth.  Do not shave 48 hours prior to surgery.  Men may shave face and neck.  Do not bring valuables to the hospital.  [facility] is not responsible for any belongings or valuables.  Contacts, dentures or bridgework may not be worn into surgery.  Leave your suitcase in the car.  After surgery it may be brought to your room.  For patients admitted to the hospital, discharge time will be determined by your treatment team.  Patients discharged the day of surgery will not be allowed to drive home.   Name and phone number of your driver:   family Special instructions:  None  Please read over the following fact sheets that you were given. Anesthesia Post-op Instructions and Care and Recovery After Surgery       Colonoscopy, Adult, Care After This sheet gives you information about how to care for yourself after your procedure. Your health care provider may also give you more specific instructions. If you have problems or questions, contact your health care provider. What can I expect after the procedure? After the procedure, it is common to have:  A small amount of blood in your stool for 24 hours after the procedure.  Some gas.  Mild abdominal cramping or bloating. Follow these instructions at home: General instructions  For the first 24 hours after the procedure: ? Do not drive or use machinery. ? Do not  sign important documents. ? Do not drink alcohol. ? Do your regular daily activities at a slower pace than normal. ? Eat soft, easy-to-digest foods.  Take over-the-counter or prescription medicines only as told by your health care provider. Relieving cramping and bloating   Try walking around when you have cramps or feel bloated.  Apply heat to your abdomen as told by your health care provider. Use a heat source that your health care provider recommends, such as a moist heat pack or a heating pad. ? Place a towel between your skin and the heat source. ? Leave the heat on for 20-30 minutes. ? Remove the heat if your skin turns bright red. This is especially important if you are unable to feel pain, heat, or cold. You may have a greater risk of getting burned. Eating and drinking   Drink enough fluid to keep your urine pale yellow.  Resume your normal diet as instructed by your health care provider. Avoid heavy or fried foods that are hard to digest.  Avoid drinking alcohol for as long as instructed by your health care provider. Contact a health care provider if:  You have blood in your stool 2-3 days after the procedure. Get help right away if:  You have more than a small spotting of blood in your stool.  You pass large blood clots in your stool.  Your abdomen is swollen.  You have nausea or vomiting.  You have a fever.  You have increasing abdominal pain that is not relieved with medicine. Summary  After the procedure, it is common to have a small amount of blood in your stool. You may also have mild abdominal cramping and bloating.  For the first 24 hours after the procedure, do not drive or use machinery, sign important documents, or drink alcohol.  Contact your health care provider if you have a lot of blood in your stool, nausea or vomiting, a fever, or increased abdominal pain. This information is not intended to replace advice given to you by your health care  provider. Make sure you discuss any questions you have with your health care provider. Document Released: 11/22/2003 Document Revised: 01/30/2017 Document Reviewed: 06/21/2015 Elsevier Patient Education  2020 [place] After These instructions provide you with information about caring for yourself after your procedure. Your health care provider may also give you more specific instructions. Your treatment has been planned according to current medical practices, but problems sometimes occur. Call your health care provider if you have any problems or questions after your procedure. What can I expect after the procedure? After your procedure, you may:  Feel sleepy for several hours.  Feel clumsy and have poor balance for several hours.  Feel forgetful about what happened after the procedure.  Have poor judgment for several hours.  Feel nauseous or vomit.  Have a sore throat if you had a breathing tube during the procedure. Follow these instructions at home: For at least 24 hours after the procedure:      Have a responsible adult stay with you. It is important to have someone help care for you until you are awake and alert.  Rest as needed.  Do not: ? Participate in activities in which you could fall or become injured. ? Drive. ? Use heavy machinery. ? Drink alcohol. ? Take sleeping pills or medicines that cause drowsiness. ? Make important decisions or sign legal documents. ? Take care of children on your own. Eating and drinking  Follow the diet that is recommended by your health care provider.  If you vomit, drink water, juice, or soup when you can drink without vomiting.  Make sure you have little or no nausea before eating solid foods. General instructions  Take over-the-counter and prescription medicines only as told by your health care provider.  If you have sleep apnea, surgery and certain medicines can increase your risk for  breathing problems. Follow instructions from your health care provider about wearing your sleep device: ? Anytime you are sleeping, including during daytime naps. ? While taking prescription pain medicines, sleeping medicines, or medicines that make you drowsy.  If you smoke, do not smoke without supervision.  Keep all follow-up visits as told by your health care provider. This is important. Contact a health care provider if:  You keep feeling nauseous or you keep vomiting.  You feel light-headed.  You develop a rash.  You have a fever. Get help right away if:  You have trouble breathing. Summary  For several hours after your procedure, you may feel sleepy and have poor judgment.  Have a responsible adult stay with you for at least 24 hours or until you are awake and alert. This information is not intended to replace advice given to you by your health care provider. Make sure you  discuss any questions you have with your health care provider. Document Released: 07/31/2015 Document Revised: 07/08/2017 Document Reviewed: 07/31/2015 Elsevier Patient Education  2020 [employer].

## 2019-01-15 ENCOUNTER — Encounter (HOSPITAL_COMMUNITY): Payer: Self-pay

## 2019-01-15 ENCOUNTER — Other Ambulatory Visit: Payer: Self-pay

## 2019-01-15 ENCOUNTER — Other Ambulatory Visit (HOSPITAL_COMMUNITY)
Admission: RE | Admit: 2019-01-15 | Discharge: 2019-01-15 | Disposition: A | Discharge status: Home / Self Care | Payer: Medicare Other | Source: Ambulatory Visit | Attending: Internal Medicine | Admitting: Internal Medicine

## 2019-01-15 ENCOUNTER — Encounter (HOSPITAL_COMMUNITY)
Admission: RE | Admit: 2019-01-15 | Discharge: 2019-01-15 | Disposition: A | Discharge status: Home / Self Care | Payer: Medicare Other | Source: Ambulatory Visit | Attending: Internal Medicine | Admitting: Internal Medicine

## 2019-01-15 DIAGNOSIS — Z01812 Encounter for preprocedural laboratory examination: Secondary | ICD-10-CM | POA: Insufficient documentation

## 2019-01-15 DIAGNOSIS — Z20828 Contact with and (suspected) exposure to other viral communicable diseases: Secondary | ICD-10-CM | POA: Insufficient documentation

## 2019-01-15 LAB — BASIC METABOLIC PANEL
Anion gap: 8 (ref 5–15)
BUN: 62 mg/dL — ABNORMAL HIGH (ref 8–23)
CO2: 19 mmol/L — ABNORMAL LOW (ref 22–32)
Calcium: 9.7 mg/dL (ref 8.9–10.3)
Chloride: 115 mmol/L — ABNORMAL HIGH (ref 98–111)
Creatinine, Ser: 1.88 mg/dL — ABNORMAL HIGH (ref 0.61–1.24)
GFR calc Af Amer: 42 mL/min — ABNORMAL LOW (ref >60)
GFR calc non Af Amer: 37 mL/min — ABNORMAL LOW (ref >60)
Glucose, Bld: 91 mg/dL (ref 70–99)
Potassium: 4.8 mmol/L (ref 3.5–5.1)
Sodium: 142 mmol/L (ref 135–145)

## 2019-01-15 LAB — SARS CORONAVIRUS 2 (TAT 6-24 HRS): SARS Coronavirus 2: NEGATIVE

## 2019-01-16 DIAGNOSIS — E875 Hyperkalemia: Secondary | ICD-10-CM | POA: Diagnosis not present

## 2019-01-16 DIAGNOSIS — N189 Chronic kidney disease, unspecified: Secondary | ICD-10-CM | POA: Diagnosis not present

## 2019-01-16 DIAGNOSIS — Z940 Kidney transplant status: Secondary | ICD-10-CM | POA: Diagnosis not present

## 2019-01-19 ENCOUNTER — Encounter (HOSPITAL_COMMUNITY)
Admission: RE | Disposition: A | Discharge status: Home / Self Care | Payer: Self-pay | Source: Home / Self Care | Attending: Internal Medicine

## 2019-01-19 ENCOUNTER — Ambulatory Visit (HOSPITAL_COMMUNITY): Payer: Medicare Other | Admitting: Anesthesiology

## 2019-01-19 ENCOUNTER — Other Ambulatory Visit: Payer: Self-pay

## 2019-01-19 ENCOUNTER — Ambulatory Visit (HOSPITAL_COMMUNITY)
Admission: RE | Admit: 2019-01-19 | Discharge: 2019-01-19 | Disposition: A | Discharge status: Home / Self Care | Payer: Medicare Other | Attending: Internal Medicine | Admitting: Internal Medicine

## 2019-01-19 DIAGNOSIS — Z09 Encounter for follow-up examination after completed treatment for conditions other than malignant neoplasm: Secondary | ICD-10-CM | POA: Diagnosis not present

## 2019-01-19 DIAGNOSIS — Z86010 Personal history of colonic polyps: Secondary | ICD-10-CM | POA: Diagnosis not present

## 2019-01-19 DIAGNOSIS — E119 Type 2 diabetes mellitus without complications: Secondary | ICD-10-CM | POA: Insufficient documentation

## 2019-01-19 DIAGNOSIS — K635 Polyp of colon: Secondary | ICD-10-CM

## 2019-01-19 DIAGNOSIS — E039 Hypothyroidism, unspecified: Secondary | ICD-10-CM | POA: Insufficient documentation

## 2019-01-19 DIAGNOSIS — K5730 Diverticulosis of large intestine without perforation or abscess without bleeding: Secondary | ICD-10-CM | POA: Insufficient documentation

## 2019-01-19 DIAGNOSIS — G255 Other chorea: Secondary | ICD-10-CM | POA: Insufficient documentation

## 2019-01-19 DIAGNOSIS — Z7952 Long term (current) use of systemic steroids: Secondary | ICD-10-CM | POA: Diagnosis not present

## 2019-01-19 DIAGNOSIS — K621 Rectal polyp: Secondary | ICD-10-CM

## 2019-01-19 DIAGNOSIS — E7800 Pure hypercholesterolemia, unspecified: Secondary | ICD-10-CM | POA: Diagnosis not present

## 2019-01-19 DIAGNOSIS — D128 Benign neoplasm of rectum: Secondary | ICD-10-CM | POA: Insufficient documentation

## 2019-01-19 DIAGNOSIS — Z940 Kidney transplant status: Secondary | ICD-10-CM | POA: Diagnosis not present

## 2019-01-19 DIAGNOSIS — Z794 Long term (current) use of insulin: Secondary | ICD-10-CM | POA: Insufficient documentation

## 2019-01-19 DIAGNOSIS — Z79899 Other long term (current) drug therapy: Secondary | ICD-10-CM | POA: Insufficient documentation

## 2019-01-19 DIAGNOSIS — Z79890 Hormone replacement therapy (postmenopausal): Secondary | ICD-10-CM | POA: Diagnosis not present

## 2019-01-19 DIAGNOSIS — Z1211 Encounter for screening for malignant neoplasm of colon: Secondary | ICD-10-CM | POA: Diagnosis not present

## 2019-01-19 HISTORY — PX: POLYPECTOMY: SHX5525

## 2019-01-19 HISTORY — PX: COLONOSCOPY WITH PROPOFOL: SHX5780

## 2019-01-19 LAB — GLUCOSE, CAPILLARY
Glucose-Capillary: 59 mg/dL — ABNORMAL LOW (ref 70–99)
Glucose-Capillary: 117 mg/dL — ABNORMAL HIGH (ref 70–99)
Glucose-Capillary: 89 mg/dL (ref 70–99)

## 2019-01-19 SURGERY — COLONOSCOPY WITH PROPOFOL
Anesthesia: General

## 2019-01-19 MED ORDER — PROPOFOL 10 MG/ML IV BOLUS
INTRAVENOUS | Status: DC | PRN
  Administered 2019-01-19: 50 mg via INTRAVENOUS

## 2019-01-19 MED ORDER — PROPOFOL 10 MG/ML IV BOLUS
INTRAVENOUS | Status: AC
  Filled 2019-01-19: qty 40

## 2019-01-19 MED ORDER — DEXTROSE 50 % IV SOLN: 25.0000 mL | Freq: Once | INTRAVENOUS | Status: DC

## 2019-01-19 MED ORDER — PROPOFOL 500 MG/50ML IV EMUL
INTRAVENOUS | Status: DC | PRN
  Administered 2019-01-19: 100 ug/kg/min via INTRAVENOUS

## 2019-01-19 MED ORDER — LACTATED RINGERS IV SOLN
INTRAVENOUS | Status: DC
  Administered 2019-01-19 (×2): via INTRAVENOUS

## 2019-01-19 MED ORDER — HYDROMORPHONE HCL 1 MG/ML IJ SOLN: 0.2500 mg | INTRAMUSCULAR | Status: DC | PRN

## 2019-01-19 MED ORDER — HYDROCODONE-ACETAMINOPHEN 7.5-325 MG PO TABS: 1.0000 | ORAL_TABLET | Freq: Once | ORAL | Status: DC | PRN

## 2019-01-19 MED ORDER — LIDOCAINE HCL 1 % IJ SOLN
INTRAMUSCULAR | Status: DC | PRN
  Administered 2019-01-19: 50 mg via INTRADERMAL

## 2019-01-19 MED ORDER — DEXTROSE 50 % IV SOLN
INTRAVENOUS | Status: AC
  Filled 2019-01-19: qty 50

## 2019-01-19 MED ORDER — DEXTROSE 50 % IV SOLN
25.0000 mL | Freq: Once | INTRAVENOUS | Status: AC
  Administered 2019-01-19: 25 mL via INTRAVENOUS

## 2019-01-19 MED ORDER — PROMETHAZINE HCL 25 MG/ML IJ SOLN: 6.2500 mg | INTRAMUSCULAR | Status: DC | PRN

## 2019-01-19 MED ORDER — MIDAZOLAM HCL 2 MG/2ML IJ SOLN: 0.5000 mg | Freq: Once | INTRAMUSCULAR | Status: DC | PRN

## 2019-01-19 NOTE — Transfer of Care (Signed)
Immediate Anesthesia Transfer of Care Note  Patient: Nathan Tyler  Procedure(s) Performed: COLONOSCOPY WITH PROPOFOL (N/A ) POLYPECTOMY  Patient Location: PACU  Anesthesia Type:MAC  Level of Consciousness: awake, alert  and oriented  Airway & Oxygen Therapy: Patient Spontanous Breathing  Post-op Assessment: Report given to RN, Post -op Vital signs reviewed and stable and Patient moving all extremities X 4  Post vital signs: Reviewed and stable  Last Vitals:  Vitals Value Taken Time  BP 99/46 01/19/19 1100  Temp    Pulse 71 01/19/19 1102  Resp 26 01/19/19 1102  SpO2 97 % 01/19/19 1102  Vitals shown include unvalidated device data.  Last Pain:  Vitals:   01/19/19 0930  PainSc: 0-No pain         Complications: No apparent anesthesia complications

## 2019-01-19 NOTE — Op Note (Signed)
[facility] Patient Name: Nathan Tyler Procedure Date: 01/19/2019 10:07 AM MRN: [id number] Date of Birth: [birth year] Attending MD: Norvel Richards , MD CSN: [id number] Age: 63 Admit Type: Outpatient Procedure:                Colonoscopy Indications:              High risk colon cancer surveillance: Personal                            history of colonic polyps Providers:                Norvel Richards, MD, Janeece Riggers, RN, Randa Spike, Technician Referring MD:              Medicines:                Propofol per Anesthesia Complications:            No immediate complications. Estimated Blood Loss:     Estimated blood loss was minimal. Procedure:                Pre-Anesthesia Assessment:                           - Prior to the procedure, a History and Physical                            was performed, and patient medications and                            allergies were reviewed. The patient's tolerance of                            previous anesthesia was also reviewed. The risks                            and benefits of the procedure and the sedation                            options and risks were discussed with the patient.                            All questions were answered, and informed consent                            was obtained. Prior Anticoagulants: The patient has                            taken no previous anticoagulant or antiplatelet                            agents. ASA Grade Assessment: II - A patient with  mild systemic disease. After reviewing the risks                            and benefits, the patient was deemed in                            satisfactory condition to undergo the procedure.                           After obtaining informed consent, the colonoscope                            was passed under direct vision. Throughout the                            procedure, the  patient's blood pressure, pulse, and                            oxygen saturations were monitored continuously. The                            CF-HQ190L ([id number]) scope was introduced through                            the anus and advanced to the the cecum, identified                            by appendiceal orifice and ileocecal valve. The                            colonoscopy was performed without difficulty. The                            patient tolerated the procedure well. The quality                            of the bowel preparation was adequate. Scope In: 10:35:44 AM Scope Out: 07:12:19 AM Scope Withdrawal Time: 0 hours 12 minutes 32 seconds  Total Procedure Duration: 0 hours 18 minutes 28 seconds  Findings:      The perianal and digital rectal examinations were normal.      Two sessile polyps were found in the rectum and hepatic flexure. The       polyps were 4 to 6 mm in size. These polyps were removed with a cold       snare. Resection and retrieval were complete. Estimated blood loss was       minimal.      Multiple medium-mouthed diverticula were found in the colon.      The exam was otherwise without abnormality on direct and retroflexion       views. Impression:               - Two 4 to 6 mm polyps in the rectum and at the  hepatic flexure, removed with a cold snare.                            Resected and retrieved.                           - Diverticulosis.                           - The examination was otherwise normal on direct                            and retroflexion views. Moderate Sedation:      Moderate (conscious) sedation was personally administered by an       anesthesia professional. The following parameters were monitored: oxygen       saturation, heart rate, blood pressure, respiratory rate, EKG, adequacy       of pulmonary ventilation, and response to care. Recommendation:           - Patient has a contact number  available for                            emergencies. The signs and symptoms of potential                            delayed complications were discussed with the                            patient. Return to normal activities tomorrow.                            Written discharge instructions were provided to the                            patient.                           - Resume previous diet.                           - Continue present medications.                           - Repeat colonoscopy date to be determined after                            pending pathology results are reviewed for                            surveillance based on pathology results.                           - Return to GI office (date not yet determined). Procedure Code(s):        --- Professional ---  45385, Colonoscopy, flexible; with removal of                            tumor(s), polyp(s), or other lesion(s) by snare                            technique Diagnosis Code(s):        --- Professional ---                           Z86.010, Personal history of colonic polyps                           K62.1, Rectal polyp                           K63.5, Polyp of colon                           K57.30, Diverticulosis of large intestine without                            perforation or abscess without bleeding CPT copyright 2019 American Medical Association. All rights reserved. The codes documented in this report are preliminary and upon coder review may  be revised to meet current compliance requirements. [name]. Sanaa Zilberman, MD Norvel Richards, MD 01/19/2019 11:03:37 AM This report has been signed electronically. Number of Addenda: 0

## 2019-01-19 NOTE — Discharge Instructions (Addendum)
Diverticulosis  Diverticulosis is a condition that develops when small pouches (diverticula) form in the wall of the large intestine (colon). The colon is where water is absorbed and stool is formed. The pouches form when the inside layer of the colon pushes through weak spots in the outer layers of the colon. You may have a few pouches or many of them. What are the causes? The cause of this condition is not known. What increases the risk? The following factors may make you more likely to develop this condition:  Being older than age 65. Your risk for this condition increases with age. Diverticulosis is rare among people younger than age 65. By age 65, many people have it.  Eating a low-fiber diet.  Having frequent constipation.  Being overweight.  Not getting enough exercise.  Smoking.  Taking over-the-counter pain medicines, like aspirin and ibuprofen.  Having a family history of diverticulosis. What are the signs or symptoms? In most people, there are no symptoms of this condition. If you do have symptoms, they may include:  Bloating.  Cramps in the abdomen.  Constipation or diarrhea.  Pain in the lower left side of the abdomen. How is this diagnosed? This condition is most often diagnosed during an exam for other colon problems. Because diverticulosis usually has no symptoms, it often cannot be diagnosed independently. This condition may be diagnosed by:  Using a flexible scope to examine the colon (colonoscopy).  Taking an X-ray of the colon after dye has been put into the colon (barium enema).  Doing a CT scan. How is this treated? You may not need treatment for this condition if you have never developed an infection related to diverticulosis. If you have had an infection before, treatment may include:  Eating a high-fiber diet. This may include eating more fruits, vegetables, and grains.  Taking a fiber supplement.  Taking a live bacteria supplement  (probiotic).  Taking medicine to relax your colon.  Taking antibiotic medicines. Follow these instructions at home:  Drink 6-8 glasses of water or more each day to prevent constipation.  Try not to strain when you have a bowel movement.  If you have had an infection before: ? Eat more fiber as directed by your health care provider or your diet and nutrition specialist (dietitian). ? Take a fiber supplement or probiotic, if your health care provider approves.  Take over-the-counter and prescription medicines only as told by your health care provider.  If you were prescribed an antibiotic, take it as told by your health care provider. Do not stop taking the antibiotic even if you start to feel better.  Keep all follow-up visits as told by your health care provider. This is important. Contact a health care provider if:  You have pain in your abdomen.  You have bloating.  You have cramps.  You have not had a bowel movement in 3 days. Get help right away if:  Your pain gets worse.  Your bloating becomes very bad.  You have a fever or chills, and your symptoms suddenly get worse.  You vomit.  You have bowel movements that are bloody or black.  You have bleeding from your rectum. Summary  Diverticulosis is a condition that develops when small pouches (diverticula) form in the wall of the large intestine (colon).  You may have a few pouches or many of them.  This condition is most often diagnosed during an exam for other colon problems.  If you have had an infection related to  diverticulosis, treatment may include increasing the fiber in your diet, taking supplements, or taking medicines. This information is not intended to replace advice given to you by your health care provider. Make sure you discuss any questions you have with your health care provider. Document Released: 01/05/2004 Document Revised: 03/22/2017 Document Reviewed: 02/27/2016 Elsevier Patient Education   [place]. Colon Polyps  Polyps are tissue growths inside the body. Polyps can grow in many places, including the large intestine (colon). A polyp may be a round bump or a mushroom-shaped growth. You could have one polyp or several. Most colon polyps are noncancerous (benign). However, some colon polyps can become cancerous over time. Finding and removing the polyps early can help prevent this. What are the causes? The exact cause of colon polyps is not known. What increases the risk? You are more likely to develop this condition if you:  Have a family history of colon cancer or colon polyps.  Are older than 65 or older than 65 if you are African American. if you are African American.  Have inflammatory bowel disease, such as ulcerative colitis or Crohn's disease.  Have certain hereditary conditions, such as: ? Familial adenomatous polyposis. ? Lynch syndrome. ? Turcot syndrome. ? Peutz-Jeghers syndrome.  Are overweight.  Smoke cigarettes.  Do not get enough exercise.  Drink too much alcohol.  Eat a diet that is high in fat and red meat and low in fiber.  Had childhood cancer that was treated with abdominal radiation. What are the signs or symptoms? Most polyps do not cause symptoms. If you have symptoms, they may include:  Blood coming from your rectum when having a bowel movement.  Blood in your stool. The stool may look dark red or black.  Abdominal pain.  A change in bowel habits, such as constipation or diarrhea. How is this diagnosed? This condition is diagnosed with a colonoscopy. This is a procedure in which a lighted, flexible scope is inserted into the anus and then passed into the colon to examine the area. Polyps are sometimes found when a colonoscopy is done as part of routine cancer screening tests. How is this treated? Treatment for this condition involves removing any polyps that are found. Most polyps can be removed during a colonoscopy. Those polyps will then be tested for  cancer. Additional treatment may be needed depending on the results of testing. Follow these instructions at home: Lifestyle  Maintain a healthy weight, or lose weight if recommended by your health care provider.  Exercise every day or as told by your health care provider.  Do not use any products that contain nicotine or tobacco, such as cigarettes and e-cigarettes. If you need help quitting, ask your health care provider.  If you drink alcohol, limit how much you have: ? 0-1 drink a day for women. ? 0-2 drinks a day for men.  Be aware of how much alcohol is in your drink. In the [place] one drink equals one 12 oz bottle of beer (355 mL), one 5 oz glass of wine (148 mL), or one 1 oz shot of hard liquor (44 mL). Eating and drinking   Eat foods that are high in fiber, such as fruits, vegetables, and whole grains.  Eat foods that are high in calcium and vitamin D, such as milk, cheese, yogurt, eggs, liver, fish, and broccoli.  Limit foods that are high in fat, such as fried foods and desserts.  Limit the amount of red meat and processed meat you eat, such as hot dogs,  sausage, bacon, and lunch meats. General instructions  Keep all follow-up visits as told by your health care provider. This is important. ? This includes having regularly scheduled colonoscopies. ? Talk to your health care provider about when you need a colonoscopy. Contact a health care provider if:  You have new or worsening bleeding during a bowel movement.  You have new or increased blood in your stool.  You have a change in bowel habits.  You lose weight for no known reason. Summary  Polyps are tissue growths inside the body. Polyps can grow in many places, including the colon.  Most colon polyps are noncancerous (benign), but some can become cancerous over time.  This condition is diagnosed with a colonoscopy.  Treatment for this condition involves removing any polyps that are found. Most polyps can be  removed during a colonoscopy. This information is not intended to replace advice given to you by your health care provider. Make sure you discuss any questions you have with your health care provider. Document Released: 01/04/2004 Document Revised: 07/25/2017 Document Reviewed: 07/25/2017 Elsevier Patient Education  2020 [place].  Colonoscopy Discharge Instructions  Read the instructions outlined below and refer to this sheet in the next few weeks. These discharge instructions provide you with general information on caring for yourself after you leave the hospital. Your doctor may also give you specific instructions. While your treatment has been planned according to the most current medical practices available, unavoidable complications occasionally occur. If you have any problems or questions after discharge, call Dr. Gala Romney at [phone]. ACTIVITY  You may resume your regular activity, but move at a slower pace for the next 24 hours.   Take frequent rest periods for the next 24 hours.   Walking will help get rid of the air and reduce the bloated feeling in your belly (abdomen).   No driving for 24 hours (because of the medicine (anesthesia) used during the test).    Do not sign any important legal documents or operate any machinery for 24 hours (because of the anesthesia used during the test).  NUTRITION  Drink plenty of fluids.   You may resume your normal diet as instructed by your doctor.   Begin with a light meal and progress to your normal diet. Heavy or fried foods are harder to digest and may make you feel sick to your stomach (nauseated).   Avoid alcoholic beverages for 24 hours or as instructed.  MEDICATIONS  You may resume your normal medications unless your doctor tells you otherwise.  WHAT YOU CAN EXPECT TODAY  Some feelings of bloating in the abdomen.   Passage of more gas than usual.   Spotting of blood in your stool or on the toilet paper.  IF YOU HAD  POLYPS REMOVED DURING THE COLONOSCOPY:  No aspirin products for 7 days or as instructed.   No alcohol for 7 days or as instructed.   Eat a soft diet for the next 24 hours.  FINDING OUT THE RESULTS OF YOUR TEST Not all test results are available during your visit. If your test results are not back during the visit, make an appointment with your caregiver to find out the results. Do not assume everything is normal if you have not heard from your caregiver or the medical facility. It is important for you to follow up on all of your test results.  SEEK IMMEDIATE MEDICAL ATTENTION IF:  You have more than a spotting of blood in your stool.  Your belly is swollen (abdominal distention).   You are nauseated or vomiting.   You have a temperature over 101.   You have abdominal pain or discomfort that is severe or gets worse throughout the day.   Polyp and diverticulosis information provided  Further recommendations to follow pending review of pathology report  At patient request, as called Terri at [phone] and reviewed results. Diverticulosis Polyp

## 2019-01-19 NOTE — Anesthesia Postprocedure Evaluation (Signed)
Anesthesia Post Note Late Entry for 1113  Patient: Nathan Tyler  Procedure(s) Performed: COLONOSCOPY WITH PROPOFOL (N/A ) POLYPECTOMY  Patient location during evaluation: PACU Anesthesia Type: General Level of consciousness: awake and alert, oriented and patient cooperative Pain management: pain level controlled Vital Signs Assessment: post-procedure vital signs reviewed and stable Respiratory status: spontaneous breathing Cardiovascular status: stable Postop Assessment: no apparent nausea or vomiting Anesthetic complications: no     Last Vitals:  Vitals:   01/19/19 1115 01/19/19 1126  BP: 125/79 137/87  Pulse: 70 73  Resp: 20   Temp:  (!) 36.4 C  SpO2: 99% 96%    Last Pain:  Vitals:   01/19/19 1126  TempSrc: Oral  PainSc: 0-No pain                 Analeigh Aries A

## 2019-01-19 NOTE — Anesthesia Preprocedure Evaluation (Addendum)
Anesthesia Evaluation  Patient identified by MRN, date of birth, ID band Patient awake  General Assessment Comment:Reports PONV with Kidney transplant in 2010  Reviewed: Allergy & Precautions, NPO status , Patient's Chart, lab work & pertinent test results  History of Anesthesia Complications (+) PONV and history of anesthetic complications  Airway Mallampati: II  TM Distance: >3 FB Neck ROM: Full    Dental no notable dental hx. 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   Neuromuscular disease negative psych ROS   GI/Hepatic negative GI ROS, (+) Hepatitis -, [place] h/o Hep C treated 2 years ago   Endo/Other  diabetesHypothyroidism   Renal/GU Renal InsufficiencyRenal diseaseS/p Transplant in 2003  negative genitourinary   Musculoskeletal negative musculoskeletal ROS (+)   Abdominal   Peds negative pediatric ROS (+)  Hematology negative hematology ROS (+)   Anesthesia Other Findings   Reproductive/Obstetrics negative OB ROS                            Anesthesia Physical Anesthesia Plan  ASA: III  Anesthesia Plan: General   Post-op Pain Management:    Induction: Intravenous  PONV Risk Score and Plan: 3 and TIVA, Propofol infusion, Treatment may vary due to age or medical condition and Ondansetron  Airway Management Planned: Nasal Cannula and Simple Face Mask  Additional Equipment:   Intra-op Plan:   Post-operative Plan: Extubation in OR  Informed Consent: I have reviewed the patients History and Physical, chart, labs and discussed the procedure including the risks, benefits and alternatives for the proposed anesthesia with the patient or authorized  representative who has indicated his/her understanding and acceptance.     Dental advisory given  Plan Discussed with: CRNA  Anesthesia Plan Comments: (Plan Full PPE use  Plan GA with GETA as needed d/w pt -WTP with same after Q&A)        Anesthesia Quick Evaluation

## 2019-01-19 NOTE — H&P (Signed)
@LOGO @   Primary Care Physician:  Celene Squibb, MD Primary Gastroenterologist:  Dr. Gala Romney  Pre-Procedure History & Physical: HPI:  Nathan Tyler is a 63 y.o. male here for here for surveillance colonoscopy.  History of polyps removed in [place] 5 years ago.  No bowel symptoms currently.  Past Medical History:  Diagnosis Date  . Chorea   . Diabetes mellitus without complication ([place])   . Hypercholesteremia   . Hypothyroidism   . Neuromuscular disorder ([place])   . PONV (postoperative nausea and vomiting)   . Renal failure    Has had kidney transplant 2008    Past Surgical History:  Procedure Laterality Date  . AMPUTATION Right 06/27/2016   Procedure: AMPUTATION 5TH TOE RIGHT FOOT;  Surgeon: Caprice Beaver, DPM;  Location: [facility];  Service: Podiatry;  Laterality: Right;  . AMPUTATION TOE Left 09/02/2015   Procedure: AMPUTATION  2ND TOE LEFT FOOT;  Surgeon: Aviva Signs, MD;  Location: [facility];  Service: General;  Laterality: Left;  . CATARACT EXTRACTION Bilateral   . COLONOSCOPY  01/2014   done in [place]. patient reports h/o colon polyps on TCS in 2010, 2015 colonoscopy normal but advised to come back in 2020.   Marland Kitchen KIDNEY TRANSPLANT Right 2010    Prior to Admission medications   Medication Sig Start Date End Date Taking? Authorizing Provider  atorvastatin (LIPITOR) 10 MG tablet Take 10 mg by mouth daily.   Yes [provider]  Dulaglutide (TRULICITY) 1.5 FV/4.9SW SOPN Inject 1.5 mg into the skin every 7 (seven) days.    Yes [provider]  furosemide (LASIX) 20 MG tablet Take 20 mg by mouth daily.    Yes [provider]  levothyroxine (SYNTHROID, LEVOTHROID) 100 MCG tablet Take 100 mcg by mouth daily before breakfast. 06/06/16  Yes [provider]  lisinopril (ZESTRIL) 20 MG tablet Take 20 mg by mouth daily.    Yes [provider]  mycophenolate (CELLCEPT) 500 MG tablet Take 500 mg by mouth 2 (two) times daily.   Yes [provider]  predniSONE (DELTASONE) 5 MG tablet Take 5 mg by mouth daily with breakfast.   Yes [provider]  tacrolimus (PROGRAF) 0.5 MG capsule Take 1.5 mg by mouth 2 (two) times daily.    Yes [provider]  tetrabenazine (XENAZINE) 12.5 MG tablet Take 12.5-25 mg by mouth See admin instructions. Takes 25 mg by mouth in the morning and 12.5 mg at night. 10/18/17  Yes [provider]  TRESIBA FLEXTOUCH 200 UNIT/ML SOPN Inject 30 Units daily into the skin.  04/25/16  Yes [provider]    Allergies as of 09/22/2018  . (No Known Allergies)    Family History  Problem Relation Age of Onset  . Colon cancer Maternal Aunt 75       passed from [facility]    Social History   Socioeconomic History  . Marital status: Widowed    Spouse name: Not on file  . Number of children: Not on file  . Years of education: Not on file  . Highest education level: Not on file  Occupational History  . Not on file  Social Needs  . Financial resource strain: Not on file  . Food insecurity    Worry: Not on file    Inability: Not on file  . Transportation needs    Medical: Not on file    Non-medical: Not on file  Tobacco Use  . Smoking status: Never Smoker  .  Smokeless tobacco: Never Used  Substance and Sexual Activity  . Alcohol use: Yes    Alcohol/week: 0.0 standard drinks    Comment: As of 09/02/2018: Glass of wine 2-3 times a month  . Drug use: No  . Sexual activity: Yes    Birth control/protection: None  Lifestyle  . Physical activity    Days per week: Not on file    Minutes per session: Not on file  . Stress: Not on file  Relationships  . Social Herbalist on phone: Not on file    Gets together: Not on file    Attends religious service: Not on file    Active member of club or organization: Not on file    Attends meetings of clubs or organizations: Not on file    Relationship status: Not on file  . Intimate partner violence    Fear of current  or ex partner: Not on file    Emotionally abused: Not on file    Physically abused: Not on file    Forced sexual activity: Not on file  Other Topics Concern  . Not on file  Social History Narrative  . Not on file    Review of Systems: See HPI, otherwise negative ROS  Physical Exam: BP (!) 145/90 (BP Location: Right Arm)   Pulse 74   Temp 97.7 F (36.5 C)   Resp 18   SpO2 100%  General:   Alert,  Well-developed, well-nourished, pleasant and cooperative in NAD Neck:  Supple; no masses or thyromegaly. No significant cervical adenopathy. Lungs:  Clear throughout to auscultation.   No wheezes, crackles, or rhonchi. No acute distress. Heart:  Regular rate and rhythm; no murmurs, clicks, rubs,  or gallops. Abdomen: Non-distended, normal bowel sounds.  Soft and nontender without appreciable mass or hepatosplenomegaly.  Pulses:  Normal pulses noted. Extremities:  Without clubbing or edema.  Impression/Plan: 63 year old gentleman history of colonic polyps.  Here for surveillance colonoscopy.   The risks, benefits, limitations, alternatives and imponderables have been reviewed with the patient. Questions have been answered. All parties are agreeable.      Notice: This dictation was prepared with Dragon dictation along with smaller phrase technology. Any transcriptional errors that result from this process are unintentional and may not be corrected upon review.

## 2019-01-20 ENCOUNTER — Encounter: Payer: Self-pay | Admitting: Internal Medicine

## 2019-01-20 LAB — SURGICAL PATHOLOGY

## 2019-01-21 ENCOUNTER — Encounter (HOSPITAL_COMMUNITY): Payer: Self-pay | Admitting: Internal Medicine

## 2019-01-23 NOTE — Unmapped (Signed)
[facility] Refill Coordination Note    Specialty Medication(s) to be Shipped:   Neurology: Gary Rogers    Other medication(s) to be shipped: n/a     Gary Rogers, DOB: February 10, 1956  Phone: [phone] (home)       All above HIPAA information was verified with patient.     Completed refill call assessment today to schedule patient's medication shipment from the [facility] [phone]).       Specialty medication(s) and dose(s) confirmed: Regimen is correct and unchanged.   Changes to medications: Gary Rogers reports no changes at this time.  Changes to insurance: No  Questions for the pharmacist: No    Confirmed patient received Welcome Packet with first shipment. The patient will receive a drug information handout for each medication shipped and additional FDA Medication Guides as required.       DISEASE/MEDICATION-SPECIFIC INFORMATION        N/A    SPECIALTY MEDICATION ADHERENCE     Medication Adherence    Patient reported X missed doses in the last month: 0  Specialty Medication: Gary Rogers  Patient is on additional specialty medications: No  Patient is on more than two specialty medications: No  Any gaps in refill history greater than 2 weeks in the last 3 months: no  Demonstrates understanding of importance of adherence: yes  Informant: patient                xenazine 12.5mg : Patient has 10 days of medication on hand      SHIPPING     Shipping address confirmed in Epic.     Delivery Scheduled: Yes, Expected medication delivery date: 01/30/19.     Medication will be delivered via UPS to the home address in Epic WAM.    [name]   [facility] Specialty Technician

## 2019-01-26 DIAGNOSIS — E119 Type 2 diabetes mellitus without complications: Secondary | ICD-10-CM | POA: Diagnosis not present

## 2019-01-26 DIAGNOSIS — Z125 Encounter for screening for malignant neoplasm of prostate: Secondary | ICD-10-CM | POA: Diagnosis not present

## 2019-01-26 DIAGNOSIS — E1169 Type 2 diabetes mellitus with other specified complication: Secondary | ICD-10-CM | POA: Diagnosis not present

## 2019-01-26 DIAGNOSIS — D649 Anemia, unspecified: Secondary | ICD-10-CM | POA: Diagnosis not present

## 2019-01-26 DIAGNOSIS — E1165 Type 2 diabetes mellitus with hyperglycemia: Secondary | ICD-10-CM | POA: Diagnosis not present

## 2019-01-26 DIAGNOSIS — E039 Hypothyroidism, unspecified: Secondary | ICD-10-CM | POA: Diagnosis not present

## 2019-01-26 DIAGNOSIS — E782 Mixed hyperlipidemia: Secondary | ICD-10-CM | POA: Diagnosis not present

## 2019-01-29 DIAGNOSIS — Z8619 Personal history of other infectious and parasitic diseases: Secondary | ICD-10-CM | POA: Diagnosis not present

## 2019-01-29 DIAGNOSIS — Z7409 Other reduced mobility: Secondary | ICD-10-CM | POA: Diagnosis not present

## 2019-01-29 DIAGNOSIS — N183 Chronic kidney disease, stage 3 unspecified: Secondary | ICD-10-CM | POA: Diagnosis not present

## 2019-01-29 DIAGNOSIS — Z136 Encounter for screening for cardiovascular disorders: Secondary | ICD-10-CM | POA: Diagnosis not present

## 2019-01-29 DIAGNOSIS — Z23 Encounter for immunization: Secondary | ICD-10-CM | POA: Diagnosis not present

## 2019-01-29 DIAGNOSIS — N529 Male erectile dysfunction, unspecified: Secondary | ICD-10-CM | POA: Diagnosis not present

## 2019-01-29 DIAGNOSIS — E039 Hypothyroidism, unspecified: Secondary | ICD-10-CM | POA: Diagnosis not present

## 2019-01-29 DIAGNOSIS — E46 Unspecified protein-calorie malnutrition: Secondary | ICD-10-CM | POA: Diagnosis not present

## 2019-01-29 DIAGNOSIS — D649 Anemia, unspecified: Secondary | ICD-10-CM | POA: Diagnosis not present

## 2019-01-29 DIAGNOSIS — E875 Hyperkalemia: Secondary | ICD-10-CM | POA: Diagnosis not present

## 2019-01-29 DIAGNOSIS — E1169 Type 2 diabetes mellitus with other specified complication: Secondary | ICD-10-CM | POA: Diagnosis not present

## 2019-01-29 DIAGNOSIS — E782 Mixed hyperlipidemia: Secondary | ICD-10-CM | POA: Diagnosis not present

## 2019-01-29 MED FILL — TETRABENAZINE 12.5 MG TABLET: ORAL | 30 days supply | Qty: 90 | Fill #4

## 2019-01-29 MED FILL — TETRABENAZINE 12.5 MG TABLET: 30 days supply | Qty: 90 | Fill #4 | Status: AC

## 2019-02-20 NOTE — Unmapped (Signed)
02/20/19    Travel Screening Questions Completed.    Travel Screening Questions/Answers:  1). Have you traveled out of [place] within the last 14 days?: No  2). Do you have any of the following symptoms that are new or worsening: cough, shortness of breath, loss of taste/smell, sore throat, fever or feeling feverish, repeated shaking chills, muscle pain, vomiting, or diarrhea?: No  3). Have you had close contact with a person with confirmed COVID-19 in the last 21 days before symptoms began?: No  4). Have you tested positive in the last 21 days for COVID-19?: No      Negative Travel Screen: Patient answered NO to questions 2, 3, and 4. Offer Telephone visit, Virtual Visit or In Person Visit according to the current scheduling protocol for your clinic.       The call was handled in the following manner: Scheduled for In Person Visit

## 2019-02-23 ENCOUNTER — Ambulatory Visit: Admit: 2019-02-23 | Discharge: 2019-02-24 | Payer: MEDICARE | Attending: Neurology | Primary: Neurology

## 2019-02-23 DIAGNOSIS — Z6830 Body mass index (BMI) 30.0-30.9, adult: Secondary | ICD-10-CM | POA: Diagnosis not present

## 2019-02-23 DIAGNOSIS — G255 Other chorea: Secondary | ICD-10-CM | POA: Diagnosis not present

## 2019-02-23 NOTE — Unmapped (Signed)
INSTRUCTIONS:  1.  After review today, happy to see that generally speaking the symptoms of chorea remain mild  2.  For now, we agreed to make no changes to tetrabenazine  3.  We will follow up with you in 1 years time, but in the interim recommend surveillance monitoring with CBC and EKG

## 2019-02-23 NOTE — Unmapped (Signed)
FINL [address]  [facility] NEUROLOGY CLINIC [place] [address] [place]  [address]  [place] [address]  [phone]    Date: 02/23/2019  Patient Name: Gary Rogers  MRN: [id number]  PCP: [facility], Maryjo [place], *    Assessment:      Gary Rogers is a 63 y.o. male who  has a past medical history of Anxiety, Chorea, Chronic kidney disease, Depression, Diabetes mellitus, type 2 (CMS-HCC), Hyperlipidemia, Hypertension, and Hyperthyroidism. presenting in follow up for evaluation of mostly generalized, asymmetric, adventitious movements sparing the upper face (left greater than right), historically beginning in a rather acute/subacute fashion in 2009 when he apparently had very poorly controlled blood sugars and development/appreciation of chronic kidney disease requiring renal transplant, with subsequent improvement of his chorea both subjectively and apparently objectively through follow-up clinic notes from the [place] [facility] [place] movement [facility] group initially after performing renal transplant, subsequently with introduction of neuroleptics and most recently dopamine depletors, likely etiology of chorea from a toxic/metabolic-induced choreiform disorder given the lack of family history, now with objective evidence that his orobuccolingual movements respond favorably to his tetrabenazine but with EPS symptoms while on the medication (when comparing follow up exam to his video examination while on tetrabenazine). 1. 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 He develops parakinesias during examination, with a nonstereoptyped movement that flows from joint to joint, matching with suspected choreiform disorder. Some of his speech difficulties could be apraxic in nature versus ataxic in nature should he have had any involvement of his brainstem/cerebellum with the underlying toxic/metabolic process, but may just as well be related to lower facial chorea. I might argue the latter case after seeing his lower facial dyskinesias more vividly in the past off of therapy.    Since last visit with me, the patient's phenomenology has largely remained the same.  He has not picked up any concerning ataxic signs or symptoms, nor are any clear neuropsychiatric symptoms to suggest a different neurodegenerative disease leading to his development of chorea.  This is reassuring.  That said, I can appreciate potentially some mildly increased tone in the left upper extremity which raises the possibility of mild/minimal extrapyramidal symptoms in the setting of using tetrabenazine, but time will tell if we have to address this further.    For now, he is agreeable to simply maintain on therapy because it is beneficial to him, he recalls how much more severe his symptoms were without tetrabenazine and how it truly intruded on most aspects of his daily activities.  Now there is mild intrusion and he is happy with this compared to potential toxic side effects with higher doses.  As such we simply agreed for surveillance labs and EKG, requested today and to be completed locally at his request.  I will see him back in 1 years time, or sooner if need be. *Greater than 50% of today's 25 minute clinic visit was dedicated to face-to-face counseling on all points mentioned above.      Plan:      1.  After review today, happy to see that generally speaking the symptoms of chorea remain mild  2.  For now, we agreed to make no changes to tetrabenazine  3.  We will follow up with him in 1 year's time, but in the interim recommend surveillance monitoring with CBC and EKG    *  Patient note was created using Office manager.  Any errors in syntax or even information may not have been identified and edited on initial review prior to signing this note.     Subjective:      HPI: Patient is a 63 y.o. left-handed Caucasian male with past medical history of chronic kidney disease status post renal transplant in 2010, diabetes mellitus previously quite poorly controlled, who presents for follow up of adventitious movements that have been present throughout the body since approximately 2009. These were felt to be toxic-metabolic induced choreaform movements. Please review my initial clinic visit note dated on 09/29.2016 for more information regarding history of present illness.    Interim history: Patient presents today alone.  He notes that since last visit with me, he has maintained his medication regimen as delineated below.  In doing so, he appreciates no real change in his overall adventitious movements, reporting that they are only mildly distressing and he is learned to live with them largely.  In fact, perhaps he notes more specifically that he has been less often dropping things from his hands, with no real change in the infrequent occurrence where he spits something out of his mouth.    Denies any new psychiatric symptoms, impulsive behavior, any hyperthermia/heating/flushing.    CHOREA/DYSKINESIA REGIMEN:  Tetrabenazine 12.5 mg 2 tablets at 9 AM  Tetrabenazine 12.5 mg 1 tablet at 9 PM On review of systems, the patient denies hallucinations, impulsivity, irritability, current memory deficits. He denies any aspiration spells. Denies double vision. Denies loss of consciousness episodes, does admit to some mild lightheadedness upon standing at times, unchanged. Denies urinary urgency or frequency/hesitancy of urination.  He reports a total of 2 falls since last visit with me, blaming these primarily on his neuropathy and being clumsy and that he did not watch where he was going, tripped on an object, and subsequently fell.  He denies any episodes where his feet give out from under him or drop attacks otherwise.    Past Medical Hx:    Past Medical History:   Diagnosis Date   ??? Anxiety    ??? Chorea    ??? Chronic kidney disease    ??? Depression    ??? Diabetes mellitus, type 2 (CMS-HCC)    ??? Hyperlipidemia    ??? Hypertension    ??? Hyperthyroidism        Past Surgical Hx:    Past Surgical History:   Procedure Laterality Date   ??? NEPHRECTOMY TRANSPLANTED ORGAN Bilateral 2010       Social Hx:    Social History     Socioeconomic History   ??? Marital status: Widowed     Spouse name: None   ??? Number of children: None   ??? Years of education: None   ??? Highest education level: None   Occupational History   ??? None   Social Needs   ??? Financial resource strain: None   ??? Food insecurity     Worry: None     Inability: None   ??? Transportation needs     Medical: None     Non-medical: None   Tobacco Use   ??? Smoking status: Never Smoker   ??? Smokeless tobacco: Never Used   Substance and Sexual Activity   ??? Alcohol use: Yes     Comment: 1 per month   ??? Drug use: No   ??? Sexual activity: Not Currently   Lifestyle   ??? Physical activity  Days per week: None     Minutes per session: None   ??? Stress: None   Relationships   ??? Social Wellsite geologist on phone: None     Gets together: None     Attends religious service: None     Active member of club or organization: None     Attends meetings of clubs or organizations: None Relationship status: None   Other Topics Concern   ??? None   Social History Narrative    1.  Retired Naval architect    2.  Lives alone, but nearby his mother in [place].    3.  Patient is a widower    4.  Denies any known exposure to toxic substances such as industrial pesticides or radiation.       Family Hx:    Family History   Problem Relation Age of Onset   ??? Thyroid disease Mother    ??? Brain cancer Father    ??? Hearing loss Father    ??? No Known Problems Sister    ??? Diabetes Maternal Grandfather    ??? No Known Problems Sister    ??? Hepatitis Son         Hepatitis C   ??? No Known Problems Daughter        ALLERGIES:  No Known Allergies    CURRENT MEDICATIONS:    Current Outpatient Medications   Medication Sig Dispense Refill   ??? atorvastatin (LIPITOR) 10 MG tablet Take 10 mg by mouth daily.     ??? dulaglutide (TRULICITY) 1.5 mg/0.5 mL PnIj Inject 1.5 mg under the skin every seven (7) days.     ??? furosemide (LASIX) 20 MG tablet Take 20 mg by mouth daily.      ??? insulin degludec (TRESIBA FLEXTOUCH U-200) 200 unit/mL (3 mL) InPn Inject under the skin daily.      ??? levothyroxine (SYNTHROID, LEVOTHROID) 100 MCG tablet Take 100 mcg by mouth daily at 0600.     ??? lisinopril (PRINIVIL,ZESTRIL) 10 MG tablet Take 10 mg by mouth nightly.   3   ??? mycophenolate (CELLCEPT) 500 mg tablet Take by mouth Two (2) times a day.     ??? predniSONE (DELTASONE) 5 MG tablet Take 5 mg by mouth daily.     ??? tacrolimus (PROGRAF) 0.5 MG capsule Take 2 mg by mouth Two (2) times a day. TAKE 4 CAPSULES BY MOUTH TWICE DAILY     ??? tetrabenazine (XENAZINE) 12.5 MG tablet Take 2 tablets (25 mg total) by mouth every morning AND 1 tablet (12.5 mg total) every evening. 90 tablet 6     No current facility-administered medications for this visit.      Review of Systems:  A 10-systems review was performed and, unless otherwise noted, declared negative by patient.     Objective:      Physical Exam:  Vitals:    02/23/19 1142 02/23/19 1149 Orthostatic BP: 142/75 127/64   Orthostatic Pulse: 75 86   BP Site: L Arm L Arm   BP Position: Sitting Standing   BP Cuff Size: Medium Medium        Neurological Examination:   MS: AAOx3, naming/fluency/repetition intact. Following lateralizing commands across midline     Cranial nerves: PERRL, approx. 4.5 to 3.64mm brisk bilaterally. EOMI except for inability to bury sclera on the right lateral gaze, right eye only (even with monocular testing). End gaze horizontal nystagmus appreciated, with some interrupting saccades.  I would  debate that patient has occasional evidence for a mix of hypometric and hypermetric saccades when changing targets.  VFF to confrontation. No facial asymmetry. Tongue protrudes midline.  V1-3 intact to light touch and pinprick. SCM and trapezius 5/5 bilaterally.    Motor: symmetric bulk throughout. No fasciculations.     LEFT Delt 5/5 Tricep 5/5 Bicep 5/5 WExt 5/5 WFlex 5/5 Interr 5/5   HpFlex 5/5 QFem 5/5 Hamst 5/5 ADF 5/5 APF 5/5   RIGHT Delt 5/5 Tricep 5/5 Bicep 5/5 WExt 5/5 WFlex 5/5 Interr 5/5   HpFlex 5/5 QFem 5/5 Hamst 5/5 ADF 5/5 APF 5/5    MOVEMENT DISORDERS EXAMINATION: Facial expression was normal . Volume of speech was normal . Handwriting was not assessed. There was no evidence of rest tremor. There was no evidence of postural tremor. There was no evidence of action tremor. Tone was normal in the neck and tone was normal in the right upper extremity, right lower extremity and left lower extremity, but debatably there was increased tone in the left upper extremity today (definitely influenced by paratonias).  Rapid alternating movements were normal in amplitude and speed with no breakdown in the right upper extremity, left upper extremity, right lower extremity and left lower extremity.  Foot tap was slow bilaterally. He could get up from a low lying chair without help of both hands..  Gait was steady with good heel to toe stride without alternating length of stride.  Arm swing was normal bilaterally with moderate parakinesias.     In addition, there were evident choreiform movements of the upper extremities, right upper extremity mildly worse than left upper extremity today, mildly present in the trunk and lower limbs only while ambulating. There is no evidence of dysdiadochokinesia. There is no evidence of a milkmaid grip bilaterally today. Able to maintain prolonged tongue protrusion without difficulty.  Patient is unable to perform tandem gait without having to correct 3-4 times.  He is able to stand with feet together and eyes open, but unable to stand eyes open in tandem for over 10 seconds.  In fact, when attempting to perform tandem stance, the patient developed significant parakinesias from his choreiform movements when attempting to balance himself such that it appears as though he is even posturing for short sessions (no more than 1-2 seconds).  Please see AIMS score below mostly for monitoring purposes.    Reflexes: flexor plantar responses bilaterally     LEFT Bicep 2 Tricep 2 BrachR 2 Patell 2 AJ 1 Hoffman absent RIGHT Bicep 2 Tricep 2 BrachR 2 Patell 2 AJ 1 Hoffman absent    A = absent   P = present   B = Brisk     Coordination: Finger-nose testing bilaterally demonstrates no evidence of dysmetria or ataxia.    Testing:      AIMS TARDIVE DYSKINESIA SCALE    Facial and Oral Movements  1. Muscles of facial expression (e.g. movements of forehead, eyebrows, periorbital area, cheeks. Include frowning, blinking, grimacing of upper face)   1 = minimal (may be extreme normal)    2. Lips and perioral area (e.g. puckering, pouting, smacking)  3 = moderate    3. Jaw (biting, clenching, chewing, mouth opening, lateral movement)  3 = moderate    4. Tongue (rate only increase in movement both in and out of mouth, not inability to sustain movement)  0 = none    Extremity Movements   5. Upper (arms, wrists, hands, fingers; include movements that are choreic  or athetoid.  Do not include tremor)            2 = mild     6. Lower (legs, knees, ankles, toes; e.g. lateral knee movement, foot tapping, heel dropping, foot squirming, inversion and eversion of foot)            2 = mild    Trunk Movements   7.  Neck, shoulders, hips (rocking, twisting, squirming, pelvic gyrations.  Include diaphragmatic movements)   2 = mild    Global Judgements  8.  Severity of abnormal movements          2 = mild    Based on the highest single score on the above items  9.  Incapacitation due to abnormal movements  2 = mild    10.  Patient's awareness of abnormal movements  2 = aware, mild distress    11.  Current problems with teeth and/or dentures  0 = no    12.  Does patient usually wear dentures?  0 = no  ______________________________________________________________________  TOTAL: 19

## 2019-02-24 NOTE — Unmapped (Signed)
Message left for patient to return my call. Need to discuss where he wants orders for EKG and labs sent.

## 2019-02-24 NOTE — Unmapped (Signed)
Called the pt and left a message for him to call us back and let us know where he would like to go have the CBS and EKG done at.

## 2019-02-25 NOTE — Unmapped (Signed)
[facility] Refill Coordination Note    Specialty Medication(s) to be Shipped:   Neurology: Gary Rogers    Other medication(s) to be shipped: na     Gary Rogers, DOB: 1955-06-14  Phone: [phone] (home)       All above HIPAA information was verified with patient.     Completed refill call assessment today to schedule patient's medication shipment from the [facility] [phone]).       Specialty medication(s) and dose(s) confirmed: Regimen is correct and unchanged.   Changes to medications: [name] reports no changes at this time.  Changes to insurance: No  Questions for the pharmacist: No    Confirmed patient received Welcome Packet with first shipment. The patient will receive a drug information handout for each medication shipped and additional FDA Medication Guides as required.       DISEASE/MEDICATION-SPECIFIC INFORMATION        N/A    SPECIALTY MEDICATION ADHERENCE     Medication Adherence    Patient reported X missed doses in the last month: 0  Specialty Medication: Xenazine 12.5 mg  Patient is on additional specialty medications: No  Any gaps in refill history greater than 2 weeks in the last 3 months: no  Demonstrates understanding of importance of adherence: yes  Informant: patient  Reliability of informant: reliable  Confirmed plan for next specialty medication refill: delivery by pharmacy  Refills needed for supportive medications: not needed                Xenazine 12.5 mg. 10 days on hand      SHIPPING     Shipping address confirmed in Epic.     Delivery Scheduled: Yes, Expected medication delivery date: 111020.     Medication will be delivered via UPS to the prescription address in Epic WAM.    Gary Rogers   [facility] Specialty Technician

## 2019-02-25 NOTE — Unmapped (Signed)
Message again left for patient to return my call.

## 2019-02-27 NOTE — Unmapped (Signed)
Lab orders faxed to [facility], [place]. [address]. [place] [phone]) and EKG orders to [facility] in [place], [place] [phone]).

## 2019-03-02 ENCOUNTER — Other Ambulatory Visit: Payer: Self-pay

## 2019-03-02 ENCOUNTER — Ambulatory Visit (HOSPITAL_COMMUNITY)
Admission: RE | Admit: 2019-03-02 | Discharge: 2019-03-02 | Disposition: A | Discharge status: Home / Self Care | Payer: Medicare Other | Source: Ambulatory Visit | Attending: Neurology | Admitting: Neurology

## 2019-03-02 DIAGNOSIS — G255 Other chorea: Secondary | ICD-10-CM | POA: Insufficient documentation

## 2019-03-02 MED FILL — TETRABENAZINE 12.5 MG TABLET: ORAL | 30 days supply | Qty: 90 | Fill #5

## 2019-03-02 MED FILL — TETRABENAZINE 12.5 MG TABLET: 30 days supply | Qty: 90 | Fill #5 | Status: AC

## 2019-03-03 DIAGNOSIS — G255 Other chorea: Secondary | ICD-10-CM | POA: Diagnosis not present

## 2019-03-03 DIAGNOSIS — N183 Chronic kidney disease, stage 3 unspecified: Secondary | ICD-10-CM | POA: Diagnosis not present

## 2019-03-03 DIAGNOSIS — Z940 Kidney transplant status: Secondary | ICD-10-CM | POA: Diagnosis not present

## 2019-03-05 ENCOUNTER — Encounter: Payer: Self-pay | Admitting: Nurse Practitioner

## 2019-03-05 ENCOUNTER — Other Ambulatory Visit: Payer: Self-pay

## 2019-03-05 ENCOUNTER — Ambulatory Visit (INDEPENDENT_AMBULATORY_CARE_PROVIDER_SITE_OTHER): Payer: Medicare Other | Admitting: Nurse Practitioner

## 2019-03-05 VITALS — BP 127/79 | HR 70 | Temp 97.2°F | Ht 71.0 in | Wt 221.2 lb

## 2019-03-05 DIAGNOSIS — B182 Chronic viral hepatitis C: Secondary | ICD-10-CM | POA: Diagnosis not present

## 2019-03-05 DIAGNOSIS — R197 Diarrhea, unspecified: Secondary | ICD-10-CM | POA: Diagnosis not present

## 2019-03-05 NOTE — Assessment & Plan Note (Signed)
Successfully treated with documented SVR 12.  Elastography demonstrated F2 fibrosis.  No need for ongoing liver care/cirrhosis care.  Follow-up as needed.

## 2019-03-05 NOTE — Assessment & Plan Note (Signed)
Was having rare diarrhea about 1 and every 4 stools that was loose but not watery.  Recommended Imodium at last visit.  This is since cleared up.  No overt GI symptoms.  Continue current medications and follow-up as needed.

## 2019-03-05 NOTE — Patient Instructions (Signed)
Your health issues we discussed today were:   Diarrhea: 1. I am glad this has resolved 2. Call us if you have any recurrent symptoms  Previous hepatitis C infection: 1. As we discussed, you have been cured of hepatitis C 2. This does not mean you cannot get reinfected.  Avoid high risk behaviors such as IV drug use or intranasal drug use 3. You do not have cirrhosis (end-stage scarring of your liver).  There is no need to see you on a regular basis for cirrhosis care  Overall I recommend:  1. Continue your other current medications 2. Call us if you have any questions or concerns 3. Follow-up as needed   Because of recent events of COVID-19 ("Coronavirus"), follow CDC recommendations:  1. Wash your hand frequently 2. Avoid touching your face 3. Stay away from people who are sick 4. If you have symptoms such as fever, cough, shortness of breath then call your healthcare provider for further guidance 5. If you are sick, STAY AT HOME unless otherwise directed by your healthcare provider. 6. Follow directions from state and national officials regarding staying safe   At [facility] we value your feedback. You may receive a survey about your visit today. Please share your experience as we strive to create trusting relationships with our patients to provide genuine, compassionate, quality care.  We appreciate your understanding and patience as we review any laboratory studies, imaging, and other diagnostic tests that are ordered as we care for you. Our office policy is 5 business days for review of these results, and any emergent or urgent results are addressed in a timely manner for your best interest. If you do not hear from our office in 1 week, please contact us.   We also encourage the use of MyChart, which contains your medical information for your review as well. If you are not enrolled in this feature, an access code is on this after visit summary for your  convenience. Thank you for allowing [place] to be involved in your care.  It was great to see you today!  I hope you have a Happy Thanksgiving!!

## 2019-03-05 NOTE — Progress Notes (Signed)
Referring Provider: Celene Squibb, MD Primary Care Physician:  Celene Squibb, MD Primary GI:  Dr. Gala Romney  Chief Complaint  Patient presents with  . Hepatitis C  . Diarrhea    HPI:   Nathan Tyler is a 63 y.o. male who presents for follow-up on diarrhea.  The patient was last seen in our office 09/02/2018 for chronic hepatitis C, history of colon polyps, diarrhea.  The patient's last visit with a virtual office visit due to COVID-19/coronavirus pandemic.  Hepatitis C status post treatment by [facility] liver care with Roosevelt Locks, NP with 12 weeks of Mavyret and posttreatment negative hepatitis C RNA.  His 12-week labs indicated SVR-12.  Prior to treatment had F2 fibrosis on elastography.  Right upper quadrant ultrasound found increased hepatic echotexture compatible with fatty infiltrative change but no suspicious masses and apparent no need for ongoing liver care.  At his last visit he noted due for colonoscopy in October 2020.  Has been having diarrhea for the previous 2 to 3 months with 3 stools a day and about 1 out of 4 or 5 stools as loose.  This typically in the evening and some incontinence is noted.  Stools are loose but not watery.  Other stools are consistently [place] 4.  He feels this started after he stopped working out due to stay-at-home order.  No other overt GI symptoms.  No new medications other than Lasix, no new OTC supplements or herbs.  Recommended colonoscopy as due, trial of Imodium once in the evening before bed to prevent diarrhea.  Follow-up in 6 months.  Colonoscopy completed 01/19/2019 which found two 4 to 6 mm polyps in the rectum, diverticulosis, otherwise normal.  Surgical pathology, polyps to be one hyperplastic and one tubular adenoma.  Recommended repeat colonoscopy in 5 years.  Today he states he's doing ok overall. States his diarrhea has cleared up. May have some recurrence based on dietary intake. Denies abdominal pain, N/V, hematochezia, melena, fever, chills,  unintentional weight loss. Denies URI or flu-like symptoms. Denies loss of sense of taste or smell. Did have a cold last week with mild coughing and sneezing, but this has gone away. Denies chest pain, dyspnea, dizziness, lightheadedness, syncope, near syncope. Denies any other upper or lower GI symptoms.  Past Medical History:  Diagnosis Date  . Chorea   . Diabetes mellitus without complication ([place])   . Hypercholesteremia   . Hypothyroidism   . Neuromuscular disorder ([place])   . PONV (postoperative nausea and vomiting)   . Renal failure    Has had kidney transplant 2008    Past Surgical History:  Procedure Laterality Date  . AMPUTATION Right 06/27/2016   Procedure: AMPUTATION 5TH TOE RIGHT FOOT;  Surgeon: Caprice Beaver, DPM;  Location: [facility];  Service: Podiatry;  Laterality: Right;  . AMPUTATION TOE Left 09/02/2015   Procedure: AMPUTATION  2ND TOE LEFT FOOT;  Surgeon: Aviva Signs, MD;  Location: [facility];  Service: General;  Laterality: Left;  . CATARACT EXTRACTION Bilateral   . COLONOSCOPY  01/2014   done in [place]. patient reports h/o colon polyps on TCS in 2010, 2015 colonoscopy normal but advised to come back in 2020.   Marland Kitchen COLONOSCOPY WITH PROPOFOL N/A 01/19/2019   Procedure: COLONOSCOPY WITH PROPOFOL;  Surgeon: Daneil Dolin, MD;  Location: [facility];  Service: Endoscopy;  Laterality: N/A;  9:45am  . KIDNEY TRANSPLANT Right 2010  . POLYPECTOMY  01/19/2019   Procedure: POLYPECTOMY;  Surgeon: Manus Rudd  M, MD;  Location: [facility];  Service: Endoscopy;;    Current Outpatient Medications  Medication Sig Dispense Refill  . atorvastatin (LIPITOR) 10 MG tablet Take 10 mg by mouth daily.    . Dulaglutide (TRULICITY) 1.5 WU/9.8JX SOPN Inject 1.5 mg into the skin every 7 (seven) days.     . furosemide (LASIX) 20 MG tablet Take 20 mg by mouth daily.     Marland Kitchen levothyroxine (SYNTHROID, LEVOTHROID) 100 MCG tablet Take 100 mcg by mouth daily before breakfast.    . lisinopril  (ZESTRIL) 20 MG tablet Take 20 mg by mouth daily.     . mycophenolate (CELLCEPT) 500 MG tablet Take 500 mg by mouth 2 (two) times daily.    . predniSONE (DELTASONE) 5 MG tablet Take 5 mg by mouth daily with breakfast.    . tacrolimus (PROGRAF) 0.5 MG capsule Take 1.5 mg by mouth 2 (two) times daily.     Marland Kitchen tetrabenazine (XENAZINE) 12.5 MG tablet Take 12.5-25 mg by mouth See admin instructions. Takes 25 mg by mouth in the morning and 12.5 mg at night.    . TRESIBA FLEXTOUCH 200 UNIT/ML SOPN Inject 30 Units daily into the skin.      No current facility-administered medications for this visit.     Allergies as of 03/05/2019  . (No Known Allergies)    Family History  Problem Relation Age of Onset  . Colon cancer Maternal Aunt 75       passed from [facility]    Social History   Socioeconomic History  . Marital status: Widowed    Spouse name: Not on file  . Number of children: Not on file  . Years of education: Not on file  . Highest education level: Not on file  Occupational History  . Not on file  Social Needs  . Financial resource strain: Not on file  . Food insecurity    Worry: Not on file    Inability: Not on file  . Transportation needs    Medical: Not on file    Non-medical: Not on file  Tobacco Use  . Smoking status: Never Smoker  . Smokeless tobacco: Never Used  Substance and Sexual Activity  . Alcohol use: Yes    Alcohol/week: 0.0 standard drinks    Comment: As of 09/02/2018: Glass of wine 1-2 times a month  . Drug use: No  . Sexual activity: Yes    Birth control/protection: None  Lifestyle  . Physical activity    Days per week: Not on file    Minutes per session: Not on file  . Stress: Not on file  Relationships  . Social Herbalist on phone: Not on file    Gets together: Not on file    Attends religious service: Not on file    Active member of club or organization: Not on file    Attends meetings of clubs or organizations: Not on file    Relationship  status: Not on file  Other Topics Concern  . Not on file  Social History Narrative  . Not on file    Review of Systems: General: Negative for anorexia, weight loss, fever, chills, fatigue, weakness. ENT: Negative for hoarseness, difficulty swallowing. CV: Negative for chest pain, angina, palpitations, peripheral edema.  Respiratory: Negative for dyspnea at rest, cough, sputum, wheezing.  GI: See history of present illness. Endo: Negative for unusual weight change.  Heme: Negative for bruising or bleeding. Allergy: Negative for rash or hives.  Physical Exam: BP 127/79   Pulse 70   Temp (!) 97.2 F (36.2 C) (Oral)   Ht 5\' 11"  (1.803 m)   Wt 221 lb 3.2 oz (100.3 kg)   BMI 30.85 kg/m  General:   Alert and oriented. Pleasant and cooperative. Well-nourished and well-developed.  Eyes:  Without icterus, sclera clear and conjunctiva pink.  Ears:  Normal auditory acuity. Cardiovascular:  S1, S2 present without murmurs appreciated. Extremities without clubbing or edema. Respiratory:  Clear to auscultation bilaterally. No wheezes, rales, or rhonchi. No distress.  Gastrointestinal:  +BS, soft, non-tender and non-distended. No HSM noted. No guarding or rebound. No masses appreciated.  Rectal:  Deferred  Musculoskalatal:  Symmetrical without gross deformities. Neurologic:  Alert and oriented x4 Psych:  Alert and cooperative. Normal mood and affect. Heme/Lymph/Immune: No excessive bruising noted.    03/05/2019 9:36 AM   Disclaimer: This note was dictated with voice recognition software. Similar sounding words can inadvertently be transcribed and may not be corrected upon review.

## 2019-03-13 DIAGNOSIS — E1142 Type 2 diabetes mellitus with diabetic polyneuropathy: Secondary | ICD-10-CM | POA: Diagnosis not present

## 2019-03-13 DIAGNOSIS — B351 Tinea unguium: Secondary | ICD-10-CM | POA: Diagnosis not present

## 2019-03-25 NOTE — Unmapped (Signed)
[facility] Clinical Assessment & Refill Coordination Note    Gary Rogers, Gary Rogers: 1955-09-21  Phone: [phone] (home)     All above HIPAA information was verified with patient.     Specialty Medication(s):   Neurology: tetrabenazine     Current Outpatient Medications   Medication Sig Dispense Refill   ??? atorvastatin (LIPITOR) 10 MG tablet Take 10 mg by mouth daily.     ??? dulaglutide (TRULICITY) 1.5 mg/0.5 mL PnIj Inject 1.5 mg under the skin every seven (7) days.     ??? furosemide (LASIX) 20 MG tablet Take 20 mg by mouth daily.      ??? insulin degludec (TRESIBA FLEXTOUCH U-200) 200 unit/mL (3 mL) InPn Inject under the skin daily.      ??? levothyroxine (SYNTHROID, LEVOTHROID) 100 MCG tablet Take 100 mcg by mouth daily at 0600.     ??? lisinopril (PRINIVIL,ZESTRIL) 10 MG tablet Take 10 mg by mouth nightly.   3   ??? mycophenolate (CELLCEPT) 500 mg tablet Take by mouth Two (2) times a day.     ??? predniSONE (DELTASONE) 5 MG tablet Take 5 mg by mouth daily.     ??? tacrolimus (PROGRAF) 0.5 MG capsule Take 2 mg by mouth Two (2) times a day. TAKE 4 CAPSULES BY MOUTH TWICE DAILY     ??? tetrabenazine (XENAZINE) 12.5 MG tablet Take 2 tablets (25 mg total) by mouth every morning AND 1 tablet (12.5 mg total) every evening. 90 tablet 6     No current facility-administered medications for this visit.         Changes to medications: Dex reports no changes at this time.    No Known Allergies    Changes to allergies: No    SPECIALTY MEDICATION ADHERENCE     tetrabenazine 12.5 mg: 11 days of medicine on hand       Medication Adherence    Patient reported X missed doses in the last month: 0  Specialty Medication: tetrabenazine  Patient is on additional specialty medications: No  Informant: patient          Specialty medication(s) dose(s) confirmed: Regimen is correct and unchanged.     Are there any concerns with adherence? No    Adherence counseling provided? Not needed CLINICAL MANAGEMENT AND INTERVENTION      Clinical Benefit Assessment:    Do you feel the medicine is effective or helping your condition? Yes    Clinical Benefit counseling provided? Not needed    Adverse Effects Assessment:    Are you experiencing any side effects? No    Are you experiencing difficulty administering your medicine? No    Quality of Life Assessment:    How many days over the past month did your chorea  keep you from your normal activities? For example, brushing your teeth or getting up in the morning. 0    Have you discussed this with your provider? Not needed    Therapy Appropriateness:    Is therapy appropriate? Yes, therapy is appropriate and should be continued    DISEASE/MEDICATION-SPECIFIC INFORMATION      N/A    PATIENT SPECIFIC NEEDS     ? Does the patient have any physical, cognitive, or cultural barriers? No    ? Is the patient high risk? No     ? Does the patient require a Care Management Plan? No     ? Does the patient require physician intervention or other additional services (i.e.  nutrition, smoking cessation, social work)? No      SHIPPING     Specialty Medication(s) to be Shipped:   Neurology: tetrabenazine    Other medication(s) to be shipped: none     Changes to insurance: No    Delivery Scheduled: Yes, Expected medication delivery date: 03/31/19.     Medication will be delivered via UPS to the confirmed prescription address in [facility].    The patient will receive a drug information handout for each medication shipped and additional FDA Medication Guides as required.  Verified that patient has previously received a Conservation officer, historic buildings.    All of the patient's questions and concerns have been addressed.    [name]   [facility] Specialty Pharmacist

## 2019-03-30 MED FILL — TETRABENAZINE 12.5 MG TABLET: 30 days supply | Qty: 90 | Fill #6 | Status: AC

## 2019-03-30 MED FILL — TETRABENAZINE 12.5 MG TABLET: ORAL | 30 days supply | Qty: 90 | Fill #6

## 2019-04-20 DIAGNOSIS — G255 Other chorea: Principal | ICD-10-CM

## 2019-04-20 MED ORDER — TETRABENAZINE 12.5 MG TABLET
ORAL_TABLET | ORAL | 6 refills | 30 days | Status: CP
Start: 2019-04-20 — End: ?

## 2019-04-20 NOTE — Unmapped (Signed)
Last Visit Date: 04/07/2018  Next Visit Date: LMV    No results found for: CBC, CMP     Results for orders placed or performed in visit on 04/07/18   ECG 12 lead   Result Value Ref Range    EKG Systolic BP  mmHg    EKG Diastolic BP  mmHg    EKG Ventricular Rate 71 BPM    EKG Atrial Rate 71 BPM    EKG P-R Interval 144 ms    EKG QRS Duration 88 ms    EKG Q-T Interval 366 ms    EKG QTC Calculation 397 ms    EKG Calculated P Axis 64 degrees    EKG Calculated R Axis 21 degrees    EKG Calculated T Axis 67 degrees    QTC Fredericia 387 ms

## 2019-04-20 NOTE — Unmapped (Signed)
[facility] Refill Coordination Note    Specialty Medication(s) to be Shipped:   Neurology: Gary Rogers    Other medication(s) to be shipped: n/a     Gary Rogers, DOB: 04/04/1956  Phone: [phone] (home)       All above HIPAA information was verified with patient.     Was a Nurse, learning disability used for this call? No    Completed refill call assessment today to schedule patient's medication shipment from the [facility] [phone]).       Specialty medication(s) and dose(s) confirmed: Regimen is correct and unchanged.   Changes to medications: Gary Rogers reports no changes at this time.  Changes to insurance: No  Questions for the pharmacist: No    Confirmed patient received Welcome Packet with first shipment. The patient will receive a drug information handout for each medication shipped and additional FDA Medication Guides as required.       DISEASE/MEDICATION-SPECIFIC INFORMATION        N/A    SPECIALTY MEDICATION ADHERENCE     Medication Adherence    Patient reported X missed doses in the last month: 0  Specialty Medication: Gary Rogers  Patient is on additional specialty medications: No  Patient is on more than two specialty medications: No  Any gaps in refill history greater than 2 weeks in the last 3 months: no  Demonstrates understanding of importance of adherence: yes  Informant: patient                Xenazine 12.5mg : Patient has 14 days of medication on hand       SHIPPING     Shipping address confirmed in Epic.     Delivery Scheduled: Yes, Expected medication delivery date: 04/30/19.  However, Rx request for refills was sent to the provider as there are none remaining.     Medication will be delivered via UPS to the prescription address in Epic [facility].    [name]   [facility] Specialty Technician

## 2019-04-20 NOTE — Unmapped (Signed)
Refilling chronic medication prescription of   Requested Prescriptions     Pending Prescriptions Disp Refills   ??? tetrabenazine (XENAZINE) 12.5 MG tablet 90 tablet 6     Sig: Take 2 tablets (25 mg total) by mouth every morning AND 1 tablet (12.5 mg total) every evening.   Rhina Brackett to requested pharmacy.

## 2019-04-29 DIAGNOSIS — G255 Other chorea: Principal | ICD-10-CM

## 2019-04-29 NOTE — Unmapped (Signed)
Gary Rogers 's TETRABEAZINE shipment will be delayed as a result of prior authorization being required by the patient's insurance.     I have reached out to the patient and left a voicemail message.  We will call the patient back to reschedule the delivery upon resolution. We have not confirmed the new delivery date.

## 2019-05-05 DIAGNOSIS — G255 Other chorea: Principal | ICD-10-CM

## 2019-05-05 DIAGNOSIS — G2401 Drug induced subacute dyskinesia: Principal | ICD-10-CM

## 2019-05-05 MED ORDER — AUSTEDO 6 MG TABLET
ORAL_TABLET | Freq: Two times a day (BID) | ORAL | 2 refills | 0 days | Status: CP
Start: 2019-05-05 — End: ?
  Filled 2019-05-06: qty 60, 30d supply, fill #0

## 2019-05-05 NOTE — Unmapped (Signed)
Neurology Clinical Pharmacist Telephone Call    Medication: Ginette Otto patient to discuss switching from tetrabenazine to Austedo. Due to new insurance plan, patient must try and fail Austedo before tetrabenazine will be covered. Ok to switch per Dr. Raenette Rover to Austedo 6 mg BID. Patient agreeable with swtiching. Will send rx to [facility] pharam[facility] and make them aware patient can switch directly. He only has 3 days left of tetrabenazine.    Worthy Flank, PharmD, CPP  Clinical Pharmacist, [facility]  Phone: [phone]

## 2019-05-06 MED FILL — AUSTEDO 6 MG TABLET: 30 days supply | Qty: 60 | Fill #0 | Status: AC

## 2019-05-06 NOTE — Unmapped (Signed)
[facility]   Patient Onboarding/Medication Counseling    Gary Rogers is a 64 y.o. male with chorea who I am counseling today on initiation of therapy.  I am speaking to the patient.    Was a Nurse, learning disability used for this call? No    Verified patient's date of birth / HIPAA.    Specialty medication(s) to be sent: Neurology: Austedo      Non-specialty medications/supplies to be sent: none      Medications not needed at this time: none         Austedo (deutetrabenazine)    Medication & Administration     Dosage: Take 1 tablet (6mg  by mouth twice daily,?? ??.    Administration: Take by mouth with food. Swallow whole, do not crush, chew or break tablets.    Adherence/Missed dose instructions: Take a missed dose as soon as you remember. If it is close to the time of your next dose, skip the missed dose and resume your normal schedule. Do not take 2 doses at the same time. If you have missed 7 or more days of your medication call the Neurology clinic or the [facility] for guidance.    Goals of Therapy     To improve chorea associated with Huntington's Disease or tardive dyskinesia.    Side Effects & Monitoring Parameters     ? Drowsiness  ? Fatigue  ? Diarrhea  ? Dry mouth  ? Nose or throat irritation  ? Difficulty sleeping    The following side effects should be reported to the provider:  ? Signs of an allergic reaction  ? Signs of a UTI (blood in the urine, burning or pain with urination, increased urinary frequency or urgency, fever, low stomach/pelvic pain)  ? Trouble controlling body movements that is new or worse  ? Change in balance  ? Shakiness, trouble moving around or stiffness  ? Restlessness  ? Confusion/agitation  ? Vision changes  ? Dizziness  ? Abnormal heart beat  ? Signs of NMS (fever, muscle cramps or stiffness, dizziness, severe headache, confusion, change in thinking, fast or abnormal heartbeat, excessive sweating)      Contraindications, Warnings & Precautions ? Contraindications:   o Hepatic impairment  o Patients with Huntington's Disease who have untreated or inadequately treated depression  ? QTc prolongation (EKG should be done for patients taking >24mg /day before and after increasing the dose)  ? CNS depression (do not drive or operate heavy machinery until you know how Austedo will affect you)    Drug/Food Interactions     ? Medication list reviewed in Epic. The patient was instructed to inform the care team before taking any new medications or supplements. No drug interactions identified.     Storage, Handling Precautions, & Disposal     ? Austedo should be stored at room temperature in the original labelled package.      Current Medications (including OTC/herbals), Comorbidities and Allergies     Current Outpatient Medications   Medication Sig Dispense Refill   ??? atorvastatin (LIPITOR) 10 MG tablet Take 10 mg by mouth daily.     ??? deutetrabenazine (AUSTEDO) 6 mg Tab Take 1 tablet by mouth two (2) times a day. 60 tablet 2   ??? dulaglutide (TRULICITY) 1.5 mg/0.5 mL PnIj Inject 1.5 mg under the skin every seven (7) days.     ??? furosemide (LASIX) 20 MG tablet Take 20 mg by mouth daily.      ??? insulin  degludec (TRESIBA FLEXTOUCH U-200) 200 unit/mL (3 mL) InPn Inject under the skin daily.      ??? levothyroxine (SYNTHROID, LEVOTHROID) 100 MCG tablet Take 100 mcg by mouth daily at 0600.     ??? lisinopril (PRINIVIL,ZESTRIL) 10 MG tablet Take 10 mg by mouth nightly.   3   ??? mycophenolate (CELLCEPT) 500 mg tablet Take by mouth Two (2) times a day.     ??? predniSONE (DELTASONE) 5 MG tablet Take 5 mg by mouth daily.     ??? tacrolimus (PROGRAF) 0.5 MG capsule Take 2 mg by mouth Two (2) times a day. TAKE 4 CAPSULES BY MOUTH TWICE DAILY       No current facility-administered medications for this visit.        No Known Allergies    Patient Active Problem List   Diagnosis   ??? Chorea       Reviewed and up to date in Epic.    Appropriateness of Therapy Is medication and dose appropriate based on diagnosis? Yes    Prescription has been clinically reviewed: Yes    Baseline Quality of Life Assessment      How many days over the past month did your chorea keep you from your normal activities? 0    Financial Information     Medication Assistance provided: Prior Authorization and [employer]    Anticipated copay of $0 reviewed with patient. Verified delivery address.    Delivery Information     Scheduled delivery date: 05/07/19    Expected start date: 05/09/19    Medication will be delivered via UPS to the prescription address in [facility].  This shipment will not require a signature.      Explained the services we provide at [facility] and that each month we would call to set up refills.  Stressed importance of returning phone calls so that we could ensure they receive their medications in time each month.  Informed patient that we should be setting up refills 7-10 days prior to when they will run out of medication.  A pharmacist will reach out to perform a clinical assessment periodically.  Informed patient that a welcome packet and a drug information handout will be sent.      Patient verbalized understanding of the above information as well as how to contact the pharmacy at [phone] option 4 with any questions/concerns.  The pharmacy is open Monday through Friday 8:30am-4:30pm.  A pharmacist is available 24/7 via pager to answer any clinical questions they may have.    Patient Specific Needs     ? Does the patient have any physical, cognitive, or cultural barriers? No    ? Patient prefers to have medications discussed with  Patient     ? Is the patient or caregiver able to read and understand education materials at a high school level or above? Yes    ? Patient's primary language is  English     ? Is the patient high risk? No     ? Does the patient require a Care Management Plan? No ? Does the patient require physician intervention or other additional services (i.e. nutrition, smoking cessation, social work)? No      [name]  [facility] Specialty Pharmacist

## 2019-05-07 DIAGNOSIS — E039 Hypothyroidism, unspecified: Secondary | ICD-10-CM | POA: Diagnosis not present

## 2019-05-07 DIAGNOSIS — E1169 Type 2 diabetes mellitus with other specified complication: Secondary | ICD-10-CM | POA: Diagnosis not present

## 2019-05-07 DIAGNOSIS — E1165 Type 2 diabetes mellitus with hyperglycemia: Secondary | ICD-10-CM | POA: Diagnosis not present

## 2019-05-07 DIAGNOSIS — E782 Mixed hyperlipidemia: Secondary | ICD-10-CM | POA: Diagnosis not present

## 2019-05-07 DIAGNOSIS — E119 Type 2 diabetes mellitus without complications: Secondary | ICD-10-CM | POA: Diagnosis not present

## 2019-05-12 DIAGNOSIS — E039 Hypothyroidism, unspecified: Secondary | ICD-10-CM | POA: Diagnosis not present

## 2019-05-12 DIAGNOSIS — E875 Hyperkalemia: Secondary | ICD-10-CM | POA: Diagnosis not present

## 2019-05-12 DIAGNOSIS — E1169 Type 2 diabetes mellitus with other specified complication: Secondary | ICD-10-CM | POA: Diagnosis not present

## 2019-05-12 DIAGNOSIS — Z7409 Other reduced mobility: Secondary | ICD-10-CM | POA: Diagnosis not present

## 2019-05-12 DIAGNOSIS — N529 Male erectile dysfunction, unspecified: Secondary | ICD-10-CM | POA: Diagnosis not present

## 2019-05-12 DIAGNOSIS — Z86010 Personal history of colonic polyps: Secondary | ICD-10-CM | POA: Diagnosis not present

## 2019-05-12 DIAGNOSIS — D649 Anemia, unspecified: Secondary | ICD-10-CM | POA: Diagnosis not present

## 2019-05-12 DIAGNOSIS — E46 Unspecified protein-calorie malnutrition: Secondary | ICD-10-CM | POA: Diagnosis not present

## 2019-05-12 DIAGNOSIS — Z8619 Personal history of other infectious and parasitic diseases: Secondary | ICD-10-CM | POA: Diagnosis not present

## 2019-05-12 DIAGNOSIS — N1831 Chronic kidney disease, stage 3a: Secondary | ICD-10-CM | POA: Diagnosis not present

## 2019-05-12 DIAGNOSIS — E782 Mixed hyperlipidemia: Secondary | ICD-10-CM | POA: Diagnosis not present

## 2019-05-13 ENCOUNTER — Ambulatory Visit: Payer: Medicare Other | Attending: Internal Medicine

## 2019-05-13 DIAGNOSIS — Z23 Encounter for immunization: Secondary | ICD-10-CM | POA: Diagnosis not present

## 2019-05-13 NOTE — Progress Notes (Signed)
   Covid-19 Vaccination Clinic  Name:  Nathan Tyler    MRN: [id number] DOB: [birth year]  05/13/2019  Nathan Tyler was observed post Covid-19 immunization for 15 minutes without incidence. He was provided with Vaccine Information Sheet and instruction to access the V-Safe system.   Nathan Tyler was instructed to call 911 with any severe reactions post vaccine: Marland Kitchen Difficulty breathing  . Swelling of your face and throat  . A fast heartbeat  . A bad rash all over your body  . Dizziness and weakness    Immunizations Administered    Name Date Dose VIS Date Route   Pfizer COVID-19 Vaccine 05/13/2019  7:15 PM 0.3 mL 04/03/2019 Intramuscular   Manufacturer: [place]   Lot: [id number]   [place]: [id number]

## 2019-05-14 ENCOUNTER — Other Ambulatory Visit: Payer: Self-pay

## 2019-05-21 DIAGNOSIS — Z20822 Contact with and (suspected) exposure to covid-19: Secondary | ICD-10-CM | POA: Diagnosis not present

## 2019-05-25 DIAGNOSIS — Z20822 Contact with and (suspected) exposure to covid-19: Secondary | ICD-10-CM | POA: Diagnosis not present

## 2019-05-25 DIAGNOSIS — R0602 Shortness of breath: Secondary | ICD-10-CM | POA: Diagnosis not present

## 2019-05-29 ENCOUNTER — Other Ambulatory Visit: Payer: Self-pay | Admitting: Physician Assistant

## 2019-05-29 DIAGNOSIS — I10 Essential (primary) hypertension: Secondary | ICD-10-CM

## 2019-05-29 DIAGNOSIS — Z940 Kidney transplant status: Secondary | ICD-10-CM

## 2019-05-29 DIAGNOSIS — U071 COVID-19: Secondary | ICD-10-CM

## 2019-05-29 NOTE — Progress Notes (Signed)
  I connected by phone with Nathan Tyler on 05/29/2019 at 5:12 PM to discuss the potential use of an new treatment for mild to moderate COVID-19 viral infection in non-hospitalized patients.  This patient is a 64 y.o. male that meets the FDA criteria for Emergency Use Authorization of bamlanivimab or casirivimab\imdevimab.  Has a (+) direct SARS-CoV-2 viral test result  Has mild or moderate COVID-19   Is ? 64 years of age and weighs ? 40 kg  Is NOT hospitalized due to COVID-19  Is NOT requiring oxygen therapy or requiring an increase in baseline oxygen flow rate due to COVID-19  Is within 10 days of symptom onset  Has at least one of the high risk factor(s) for progression to severe COVID-19 and/or hospitalization as defined in EUA.  Specific high risk criteria : >/= 66 yo and renal transplant on immunosuppresion and HTN    I have spoken and communicated the following to the patient or parent/caregiver:  1. FDA has authorized the emergency use of bamlanivimab and casirivimab\imdevimab for the treatment of mild to moderate COVID-19 in adults and pediatric patients with positive results of direct SARS-CoV-2 viral testing who are 4 years of age and older weighing at least 40 kg, and who are at high risk for progressing to severe COVID-19 and/or hospitalization.  2. The significant known and potential risks and benefits of bamlanivimab and casirivimab\imdevimab, and the extent to which such potential risks and benefits are unknown.  3. Information on available alternative treatments and the risks and benefits of those alternatives, including clinical trials.  4. Patients treated with bamlanivimab and casirivimab\imdevimab should continue to self-isolate and use infection control measures (e.g., wear mask, isolate, social distance, avoid sharing personal items, clean and disinfect "high touch" surfaces, and frequent handwashing) according to CDC guidelines.   5. The patient or  parent/caregiver has the option to accept or refuse bamlanivimab or casirivimab\imdevimab .  After reviewing this information with the patient, The patient agreed to proceed with receiving the casirivimab\imdevimab infusion and will be provided a copy of the Fact sheet prior to receiving the infusion.   Pt set up for 05/31/19 @ 10:30am. He was tested and Dr. Olivia Canter office and cannot get a copy of the test as it is now after hours. Their office personally called to get the referral. Sx onset 1/29. Directions given.  Angelena Form PA-C 05/29/2019 5:12 PM

## 2019-05-31 ENCOUNTER — Inpatient Hospital Stay (HOSPITAL_COMMUNITY)
Admission: AD | Admit: 2019-05-31 | Discharge: 2019-06-16 | DRG: 177 | Disposition: A | Discharge status: Home Health | Payer: Medicare Other | Source: Other Acute Inpatient Hospital | Attending: Family Medicine | Admitting: Family Medicine

## 2019-05-31 ENCOUNTER — Encounter (HOSPITAL_COMMUNITY): Payer: Self-pay | Admitting: Internal Medicine

## 2019-05-31 ENCOUNTER — Other Ambulatory Visit: Payer: Self-pay

## 2019-05-31 ENCOUNTER — Ambulatory Visit (HOSPITAL_COMMUNITY)
Admission: RE | Admit: 2019-05-31 | Discharge: 2019-05-31 | Disposition: A | Discharge status: Home / Self Care | Payer: Medicare Other | Source: Ambulatory Visit | Attending: Pulmonary Disease | Admitting: Pulmonary Disease

## 2019-05-31 ENCOUNTER — Encounter (HOSPITAL_COMMUNITY): Payer: Self-pay

## 2019-05-31 ENCOUNTER — Inpatient Hospital Stay (HOSPITAL_COMMUNITY): Payer: Medicare Other

## 2019-05-31 DIAGNOSIS — G255 Other chorea: Secondary | ICD-10-CM | POA: Diagnosis present

## 2019-05-31 DIAGNOSIS — Z79890 Hormone replacement therapy (postmenopausal): Secondary | ICD-10-CM | POA: Diagnosis not present

## 2019-05-31 DIAGNOSIS — I959 Hypotension, unspecified: Secondary | ICD-10-CM | POA: Diagnosis present

## 2019-05-31 DIAGNOSIS — R0600 Dyspnea, unspecified: Secondary | ICD-10-CM

## 2019-05-31 DIAGNOSIS — I130 Hypertensive heart and chronic kidney disease with heart failure and stage 1 through stage 4 chronic kidney disease, or unspecified chronic kidney disease: Secondary | ICD-10-CM | POA: Diagnosis not present

## 2019-05-31 DIAGNOSIS — J9601 Acute respiratory failure with hypoxia: Secondary | ICD-10-CM | POA: Diagnosis not present

## 2019-05-31 DIAGNOSIS — I10 Essential (primary) hypertension: Secondary | ICD-10-CM | POA: Diagnosis not present

## 2019-05-31 DIAGNOSIS — E663 Overweight: Secondary | ICD-10-CM | POA: Diagnosis present

## 2019-05-31 DIAGNOSIS — E039 Hypothyroidism, unspecified: Secondary | ICD-10-CM | POA: Diagnosis present

## 2019-05-31 DIAGNOSIS — B182 Chronic viral hepatitis C: Secondary | ICD-10-CM | POA: Diagnosis not present

## 2019-05-31 DIAGNOSIS — I5033 Acute on chronic diastolic (congestive) heart failure: Secondary | ICD-10-CM | POA: Diagnosis present

## 2019-05-31 DIAGNOSIS — J9690 Respiratory failure, unspecified, unspecified whether with hypoxia or hypercapnia: Secondary | ICD-10-CM | POA: Diagnosis not present

## 2019-05-31 DIAGNOSIS — N1832 Chronic kidney disease, stage 3b: Secondary | ICD-10-CM | POA: Diagnosis not present

## 2019-05-31 DIAGNOSIS — Z938 Other artificial opening status: Secondary | ICD-10-CM

## 2019-05-31 DIAGNOSIS — J9383 Other pneumothorax: Secondary | ICD-10-CM | POA: Diagnosis not present

## 2019-05-31 DIAGNOSIS — Z79899 Other long term (current) drug therapy: Secondary | ICD-10-CM | POA: Diagnosis not present

## 2019-05-31 DIAGNOSIS — E11649 Type 2 diabetes mellitus with hypoglycemia without coma: Secondary | ICD-10-CM | POA: Diagnosis not present

## 2019-05-31 DIAGNOSIS — Z8639 Personal history of other endocrine, nutritional and metabolic disease: Secondary | ICD-10-CM | POA: Diagnosis not present

## 2019-05-31 DIAGNOSIS — J80 Acute respiratory distress syndrome: Secondary | ICD-10-CM | POA: Diagnosis not present

## 2019-05-31 DIAGNOSIS — U071 COVID-19: Secondary | ICD-10-CM

## 2019-05-31 DIAGNOSIS — Z7952 Long term (current) use of systemic steroids: Secondary | ICD-10-CM

## 2019-05-31 DIAGNOSIS — J1282 Pneumonia due to coronavirus disease 2019: Secondary | ICD-10-CM | POA: Diagnosis not present

## 2019-05-31 DIAGNOSIS — Z4682 Encounter for fitting and adjustment of non-vascular catheter: Secondary | ICD-10-CM

## 2019-05-31 DIAGNOSIS — E1122 Type 2 diabetes mellitus with diabetic chronic kidney disease: Secondary | ICD-10-CM | POA: Diagnosis present

## 2019-05-31 DIAGNOSIS — E7800 Pure hypercholesterolemia, unspecified: Secondary | ICD-10-CM | POA: Diagnosis not present

## 2019-05-31 DIAGNOSIS — Z794 Long term (current) use of insulin: Secondary | ICD-10-CM

## 2019-05-31 DIAGNOSIS — I5031 Acute diastolic (congestive) heart failure: Secondary | ICD-10-CM | POA: Diagnosis not present

## 2019-05-31 DIAGNOSIS — E1165 Type 2 diabetes mellitus with hyperglycemia: Secondary | ICD-10-CM | POA: Diagnosis present

## 2019-05-31 DIAGNOSIS — E875 Hyperkalemia: Secondary | ICD-10-CM | POA: Diagnosis present

## 2019-05-31 DIAGNOSIS — N189 Chronic kidney disease, unspecified: Secondary | ICD-10-CM | POA: Diagnosis present

## 2019-05-31 DIAGNOSIS — R918 Other nonspecific abnormal finding of lung field: Secondary | ICD-10-CM | POA: Diagnosis not present

## 2019-05-31 DIAGNOSIS — E785 Hyperlipidemia, unspecified: Secondary | ICD-10-CM | POA: Diagnosis not present

## 2019-05-31 DIAGNOSIS — J939 Pneumothorax, unspecified: Secondary | ICD-10-CM | POA: Diagnosis not present

## 2019-05-31 DIAGNOSIS — Z6828 Body mass index (BMI) 28.0-28.9, adult: Secondary | ICD-10-CM

## 2019-05-31 DIAGNOSIS — Z940 Kidney transplant status: Secondary | ICD-10-CM | POA: Diagnosis not present

## 2019-05-31 DIAGNOSIS — Z89422 Acquired absence of other left toe(s): Secondary | ICD-10-CM

## 2019-05-31 DIAGNOSIS — T380X5A Adverse effect of glucocorticoids and synthetic analogues, initial encounter: Secondary | ICD-10-CM | POA: Diagnosis present

## 2019-05-31 DIAGNOSIS — J439 Emphysema, unspecified: Secondary | ICD-10-CM | POA: Diagnosis not present

## 2019-05-31 DIAGNOSIS — B1920 Unspecified viral hepatitis C without hepatic coma: Secondary | ICD-10-CM | POA: Diagnosis present

## 2019-05-31 HISTORY — DX: COVID-19: U07.1

## 2019-05-31 LAB — CBC WITH DIFFERENTIAL/PLATELET
Abs Immature Granulocytes: 0.12 10*3/uL — ABNORMAL HIGH (ref 0.00–0.07)
Basophils Absolute: 0 10*3/uL (ref 0.0–0.1)
Basophils Relative: 0 %
Eosinophils Absolute: 0 10*3/uL (ref 0.0–0.5)
Eosinophils Relative: 0 %
HCT: 46 % (ref 39.0–52.0)
Hemoglobin: 14.6 g/dL (ref 13.0–17.0)
Immature Granulocytes: 1 %
Lymphocytes Relative: 5 %
Lymphs Abs: 0.6 10*3/uL — ABNORMAL LOW (ref 0.7–4.0)
MCH: 29.4 pg (ref 26.0–34.0)
MCHC: 31.7 g/dL (ref 30.0–36.0)
MCV: 92.7 fL (ref 80.0–100.0)
Monocytes Absolute: 0.5 10*3/uL (ref 0.1–1.0)
Monocytes Relative: 4 %
Neutro Abs: 10.9 10*3/uL — ABNORMAL HIGH (ref 1.7–7.7)
Neutrophils Relative %: 90 %
Platelets: 406 10*3/uL — ABNORMAL HIGH (ref 150–400)
RBC: 4.96 MIL/uL (ref 4.22–5.81)
RDW: 13.7 % (ref 11.5–15.5)
WBC: 12.2 10*3/uL — ABNORMAL HIGH (ref 4.0–10.5)
nRBC: 0 % (ref 0.0–0.2)

## 2019-05-31 LAB — PROCALCITONIN: Procalcitonin: <0.1 ng/mL

## 2019-05-31 LAB — COMPREHENSIVE METABOLIC PANEL
ALT: 16 U/L (ref 0–44)
AST: 23 U/L (ref 15–41)
Albumin: 3.2 g/dL — ABNORMAL LOW (ref 3.5–5.0)
Alkaline Phosphatase: 72 U/L (ref 38–126)
Anion gap: 11 (ref 5–15)
BUN: 72 mg/dL — ABNORMAL HIGH (ref 8–23)
CO2: 17 mmol/L — ABNORMAL LOW (ref 22–32)
Calcium: 8.6 mg/dL — ABNORMAL LOW (ref 8.9–10.3)
Chloride: 109 mmol/L (ref 98–111)
Creatinine, Ser: 1.95 mg/dL — ABNORMAL HIGH (ref 0.61–1.24)
GFR calc Af Amer: 41 mL/min — ABNORMAL LOW (ref >60)
GFR calc non Af Amer: 35 mL/min — ABNORMAL LOW (ref >60)
Glucose, Bld: 293 mg/dL — ABNORMAL HIGH (ref 70–99)
Potassium: 5.8 mmol/L — ABNORMAL HIGH (ref 3.5–5.1)
Sodium: 137 mmol/L (ref 135–145)
Total Bilirubin: 0.4 mg/dL (ref 0.3–1.2)
Total Protein: 6.4 g/dL — ABNORMAL LOW (ref 6.5–8.1)

## 2019-05-31 LAB — D-DIMER, QUANTITATIVE: D-Dimer, Quant: 1.97 ug/mL-FEU — ABNORMAL HIGH (ref 0.00–0.50)

## 2019-05-31 LAB — HIV ANTIBODY (ROUTINE TESTING W REFLEX): HIV Screen 4th Generation wRfx: NONREACTIVE

## 2019-05-31 LAB — GLUCOSE, CAPILLARY
Glucose-Capillary: 290 mg/dL — ABNORMAL HIGH (ref 70–99)
Glucose-Capillary: 282 mg/dL — ABNORMAL HIGH (ref 70–99)
Glucose-Capillary: 383 mg/dL — ABNORMAL HIGH (ref 70–99)

## 2019-05-31 LAB — HEMOGLOBIN A1C
Hgb A1c MFr Bld: 8.5 % — ABNORMAL HIGH (ref 4.8–5.6)
Mean Plasma Glucose: 197.25 mg/dL

## 2019-05-31 LAB — FERRITIN: Ferritin: 572 ng/mL — ABNORMAL HIGH (ref 24–336)

## 2019-05-31 LAB — BRAIN NATRIURETIC PEPTIDE: B Natriuretic Peptide: 102.9 pg/mL — ABNORMAL HIGH (ref 0.0–100.0)

## 2019-05-31 LAB — TSH: TSH: 0.19 u[IU]/mL — ABNORMAL LOW (ref 0.350–4.500)

## 2019-05-31 LAB — C-REACTIVE PROTEIN: CRP: 5.7 mg/dL — ABNORMAL HIGH (ref <1.0)

## 2019-05-31 MED ORDER — LEVOTHYROXINE SODIUM 50 MCG PO TABS
100.0000 ug | ORAL_TABLET | Freq: Every day | ORAL | Status: DC
  Administered 2019-05-31 – 2019-06-16 (×17): 100 ug via ORAL
  Filled 2019-05-31 (×12): qty 2
  Filled 2019-05-31: qty 1
  Filled 2019-05-31 (×4): qty 2

## 2019-05-31 MED ORDER — TACROLIMUS 1 MG PO CAPS
1.5000 mg | ORAL_CAPSULE | Freq: Two times a day (BID) | ORAL | Status: DC
  Administered 2019-05-31 – 2019-06-09 (×19): 1.5 mg via ORAL
  Filled 2019-05-31 (×21): qty 1

## 2019-05-31 MED ORDER — GUAIFENESIN-DM 100-10 MG/5ML PO SYRP
10.0000 mL | ORAL_SOLUTION | ORAL | Status: DC | PRN
  Administered 2019-06-02 (×2): 10 mL via ORAL
  Filled 2019-05-31 (×2): qty 10

## 2019-05-31 MED ORDER — ZINC SULFATE 220 (50 ZN) MG PO CAPS
220.0000 mg | ORAL_CAPSULE | Freq: Every day | ORAL | Status: DC
  Administered 2019-05-31 – 2019-06-16 (×17): 220 mg via ORAL
  Filled 2019-05-31 (×17): qty 1

## 2019-05-31 MED ORDER — DEXAMETHASONE SODIUM PHOSPHATE 10 MG/ML IJ SOLN
10.0000 mg | Freq: Once | INTRAMUSCULAR | Status: AC
  Administered 2019-05-31: 10 mg via INTRAVENOUS
  Filled 2019-05-31: qty 1

## 2019-05-31 MED ORDER — ENOXAPARIN SODIUM 40 MG/0.4ML ~~LOC~~ SOLN
40.0000 mg | SUBCUTANEOUS | Status: DC
  Administered 2019-05-31 – 2019-06-10 (×11): 40 mg via SUBCUTANEOUS
  Filled 2019-05-31 (×11): qty 0.4

## 2019-05-31 MED ORDER — ASCORBIC ACID 500 MG PO TABS
500.0000 mg | ORAL_TABLET | Freq: Every day | ORAL | Status: DC
  Administered 2019-05-31 – 2019-06-16 (×17): 500 mg via ORAL
  Filled 2019-05-31 (×17): qty 1

## 2019-05-31 MED ORDER — POLYETHYLENE GLYCOL 3350 17 G PO PACK: 17.0000 g | PACK | Freq: Every day | ORAL | Status: DC | PRN

## 2019-05-31 MED ORDER — INSULIN GLARGINE 100 UNIT/ML ~~LOC~~ SOLN
20.0000 [IU] | Freq: Every day | SUBCUTANEOUS | Status: DC
  Administered 2019-05-31: 18:00:00 20 [IU] via SUBCUTANEOUS
  Filled 2019-05-31 (×2): qty 0.2

## 2019-05-31 MED ORDER — INSULIN ASPART 100 UNIT/ML ~~LOC~~ SOLN
0.0000 [IU] | Freq: Three times a day (TID) | SUBCUTANEOUS | Status: DC
  Administered 2019-05-31 (×2): 8 [IU] via SUBCUTANEOUS
  Administered 2019-06-01: 13:00:00 11 [IU] via SUBCUTANEOUS
  Administered 2019-06-01: 15 [IU] via SUBCUTANEOUS
  Administered 2019-06-01: 5 [IU] via SUBCUTANEOUS
  Administered 2019-06-02: 17:00:00 8 [IU] via SUBCUTANEOUS
  Administered 2019-06-03: 16:00:00 5 [IU] via SUBCUTANEOUS
  Administered 2019-06-04: 3 [IU] via SUBCUTANEOUS

## 2019-05-31 MED ORDER — SODIUM CHLORIDE 0.9 % IV SOLN: INTRAVENOUS | Status: DC

## 2019-05-31 MED ORDER — SODIUM CHLORIDE 0.9 % IV SOLN
100.0000 mg | Freq: Every day | INTRAVENOUS | Status: AC
  Administered 2019-06-01 – 2019-06-04 (×4): 100 mg via INTRAVENOUS
  Filled 2019-05-31 (×4): qty 20

## 2019-05-31 MED ORDER — INSULIN ASPART 100 UNIT/ML ~~LOC~~ SOLN
0.0000 [IU] | Freq: Every day | SUBCUTANEOUS | Status: DC
  Administered 2019-05-31: 23:00:00 5 [IU] via SUBCUTANEOUS
  Administered 2019-06-02: 4 [IU] via SUBCUTANEOUS
  Administered 2019-06-03: 22:00:00 2 [IU] via SUBCUTANEOUS

## 2019-05-31 MED ORDER — ATORVASTATIN CALCIUM 10 MG PO TABS
10.0000 mg | ORAL_TABLET | Freq: Every day | ORAL | Status: DC
  Administered 2019-05-31 – 2019-06-15 (×16): 10 mg via ORAL
  Filled 2019-05-31 (×15): qty 1

## 2019-05-31 MED ORDER — LINAGLIPTIN 5 MG PO TABS
5.0000 mg | ORAL_TABLET | Freq: Every day | ORAL | Status: DC
  Administered 2019-05-31 – 2019-06-16 (×17): 5 mg via ORAL
  Filled 2019-05-31 (×18): qty 1

## 2019-05-31 MED ORDER — MYCOPHENOLATE MOFETIL 250 MG PO CAPS
500.0000 mg | ORAL_CAPSULE | Freq: Two times a day (BID) | ORAL | Status: DC
  Administered 2019-05-31: 500 mg via ORAL
  Filled 2019-05-31 (×2): qty 2

## 2019-05-31 MED ORDER — ONDANSETRON HCL 4 MG/2ML IJ SOLN: 4.0000 mg | Freq: Four times a day (QID) | INTRAMUSCULAR | Status: DC | PRN

## 2019-05-31 MED ORDER — DEXAMETHASONE SODIUM PHOSPHATE 10 MG/ML IJ SOLN
6.0000 mg | INTRAMUSCULAR | Status: AC
  Administered 2019-06-01 – 2019-06-09 (×9): 6 mg via INTRAVENOUS
  Filled 2019-05-31 (×9): qty 1

## 2019-05-31 MED ORDER — ONDANSETRON HCL 4 MG PO TABS
4.0000 mg | ORAL_TABLET | Freq: Four times a day (QID) | ORAL | Status: DC | PRN
  Filled 2019-05-31: qty 1

## 2019-05-31 MED ORDER — ACETAMINOPHEN 325 MG PO TABS: 650.0000 mg | ORAL_TABLET | Freq: Four times a day (QID) | ORAL | Status: DC | PRN

## 2019-05-31 MED ORDER — ACETAMINOPHEN 650 MG RE SUPP: 650.0000 mg | Freq: Four times a day (QID) | RECTAL | Status: DC | PRN

## 2019-05-31 MED ORDER — SODIUM CHLORIDE 0.9 % IV SOLN
200.0000 mg | Freq: Once | INTRAVENOUS | Status: AC
  Administered 2019-05-31: 200 mg via INTRAVENOUS
  Filled 2019-05-31: qty 40

## 2019-05-31 MED ORDER — IPRATROPIUM-ALBUTEROL 20-100 MCG/ACT IN AERS
1.0000 | INHALATION_SPRAY | Freq: Four times a day (QID) | RESPIRATORY_TRACT | Status: DC
  Administered 2019-05-31 – 2019-06-16 (×59): 1 via RESPIRATORY_TRACT
  Filled 2019-05-31 (×2): qty 4

## 2019-05-31 NOTE — Progress Notes (Signed)
Patient arrived to [facility] pale and appeared SOB. Upon arrival patient stated he felt SOB, initial O2 76% on RA, T 97.3, BP 104/44, (60), P 98, RR 23, NAD Noted.  MD notified, advised to direct admit.  2L O2 via  applied, tol well, O2 @ 90%. Report called to [facility], RN to receive pt. O2 increased to 3L prior to transfer. O2 93%.

## 2019-05-31 NOTE — Plan of Care (Signed)
Care plan initiated.

## 2019-05-31 NOTE — H&P (Addendum)
History and Physical    Nathan Tyler SWH:[id number] DOB: 1956-03-01 DOA: 05/31/2019  PCP: Celene Squibb, MD  Patient coming from:   I have personally briefly reviewed patient's old medical records in [place]  Chief Complaint: Shortness of breath  HPI: Nathan Tyler is a 64 y.o. male with medical history significant of insulin-dependent diabetes mellitus, chronic hepatitis C, anxiety, chorea, CKD status post renal transplant 2010 on immunosuppression, HLD, HTN, hypothyroidism who presented as a direct admission from the [facility] infusion clinic for progressive shortness of breath in which she was found to be hypoxic on room air.  Patient tested positive for Covid-19 at his PCPs office on 05/29/2019; in which she was being set up for outpatient bamlanivimab or casirivimab\imdevimab when he was found on initial assessment to be pale, dyspneic and hypoxic oxygenating 87% on room air.  Patient states over the past 2 days, has had progressive shortness of breath with nonproductive cough.  Also reports poor oral intake with decreased appetite.  Also reports loss of taste/smell and occasional diarrhea.  Patient reports positive sick contact with Covid-19, his fiance who is currently at home on oxygen therapy.  No other complaints or concerns at this time.  Denies headache, no fever/chills/night sweats, no nausea/vomiting, no chest pain, no palpitations, no abdominal pain.   Review of Systems: As per HPI otherwise 10 point review of systems negative.    Past Medical History:  Diagnosis Date  . Chorea   . Diabetes mellitus without complication ([place])   . Hypercholesteremia   . Hypothyroidism   . Neuromuscular disorder ([place])   . PONV (postoperative nausea and vomiting)   . Renal failure    Has had kidney transplant 2008    Past Surgical History:  Procedure Laterality Date  . AMPUTATION Right 06/27/2016   Procedure: AMPUTATION 5TH TOE RIGHT FOOT;  Surgeon: Caprice Beaver, DPM;  Location: [facility];   Service: Podiatry;  Laterality: Right;  . AMPUTATION TOE Left 09/02/2015   Procedure: AMPUTATION  2ND TOE LEFT FOOT;  Surgeon: Aviva Signs, MD;  Location: [facility];  Service: General;  Laterality: Left;  . CATARACT EXTRACTION Bilateral   . COLONOSCOPY  01/2014   done in [place]. patient reports h/o colon polyps on TCS in 2010, 2015 colonoscopy normal but advised to come back in 2020.   Marland Kitchen COLONOSCOPY WITH PROPOFOL N/A 01/19/2019   Procedure: COLONOSCOPY WITH PROPOFOL;  Surgeon: Daneil Dolin, MD;  Location: [facility];  Service: Endoscopy;  Laterality: N/A;  9:45am  . KIDNEY TRANSPLANT Right 2010  . POLYPECTOMY  01/19/2019   Procedure: POLYPECTOMY;  Surgeon: Daneil Dolin, MD;  Location: [facility];  Service: Endoscopy;;     reports that he has never smoked. He has never used smokeless tobacco. He reports current alcohol use. He reports that he does not use drugs.  No Known Allergies  Family History  Problem Relation Age of Onset  . Colon cancer Maternal Aunt 75       passed from [facility]    Family history reviewed and not pertinent   Prior to Admission medications   Medication Sig Start Date End Date Taking? Authorizing Provider  atorvastatin (LIPITOR) 10 MG tablet Take 10 mg by mouth daily.    [provider]  Dulaglutide (TRULICITY) 1.5 YK/5.9DJ SOPN Inject 1.5 mg into the skin every 7 (seven) days.     [provider]  furosemide (LASIX) 20 MG tablet Take 20 mg by mouth daily.  [provider]  levothyroxine (SYNTHROID, LEVOTHROID) 100 MCG tablet Take 100 mcg by mouth daily before breakfast. 06/06/16   [provider]  lisinopril (ZESTRIL) 20 MG tablet Take 20 mg by mouth daily.     [provider]  mycophenolate (CELLCEPT) 500 MG tablet Take 500 mg by mouth 2 (two) times daily.    [provider]  predniSONE (DELTASONE) 5 MG tablet Take 5 mg by mouth daily with breakfast.    [provider]  tacrolimus (PROGRAF)  0.5 MG capsule Take 1.5 mg by mouth 2 (two) times daily.     [provider]  tetrabenazine Delcie Roch) 12.5 MG tablet Take 12.5-25 mg by mouth See admin instructions. Takes 25 mg by mouth in the morning and 12.5 mg at night. 10/18/17   [provider]  TRESIBA FLEXTOUCH 200 UNIT/ML SOPN Inject 30 Units daily into the skin.  04/25/16   [provider]    Physical Exam: Vitals:   05/31/19 1131  BP: 127/77  Pulse: 86  Resp: 18  Temp: 98 F (36.7 C)  TempSrc: Oral  SpO2: 97%    Constitutional: NAD, calm, comfortable Eyes: PERRL, lids and conjunctivae normal ENMT: Mucous membranes are moist. Posterior pharynx clear of any exudate or lesions.Normal dentition.  Neck: normal, supple, no masses, no thyromegaly Respiratory: clear to auscultation bilaterally, no wheezing, no crackles. Normal respiratory effort. No accessory muscle use.  On 6 L nasal cannula oxygenating 91% Cardiovascular: Regular rate and rhythm, no murmurs / rubs / gallops. No extremity edema. 2+ pedal pulses. No carotid bruits.  Abdomen: no tenderness, no masses palpated. No hepatosplenomegaly. Bowel sounds positive.  Musculoskeletal: no clubbing / cyanosis. No joint deformity upper and lower extremities. Good ROM, no contractures. Normal muscle tone.  Skin: no rashes, lesions, ulcers. No induration Neurologic: CN 2-12 grossly intact. Sensation intact, DTR normal. Strength 5/5 in all 4.  Noted choreiform movements at rest Psychiatric: Normal judgment and insight. Alert and oriented x 3. Normal mood.    Labs on Admission: I have personally reviewed following labs and imaging studies  CBC: No results for input(s): WBC, NEUTROABS, HGB, HCT, MCV, PLT in the last 168 hours. Basic Metabolic Panel: No results for input(s): NA, K, CL, CO2, GLUCOSE, BUN, CREATININE, CALCIUM, MG, PHOS in the last 168 hours. GFR: CrCl cannot be calculated (Patient's most recent lab result is older than the maximum 21 days  allowed.). Liver Function Tests: No results for input(s): AST, ALT, ALKPHOS, BILITOT, PROT, ALBUMIN in the last 168 hours. No results for input(s): LIPASE, AMYLASE in the last 168 hours. No results for input(s): AMMONIA in the last 168 hours. Coagulation Profile: No results for input(s): INR, PROTIME in the last 168 hours. Cardiac Enzymes: No results for input(s): CKTOTAL, CKMB, CKMBINDEX, TROPONINI in the last 168 hours. BNP (last 3 results) No results for input(s): PROBNP in the last 8760 hours. HbA1C: No results for input(s): HGBA1C in the last 72 hours. CBG: No results for input(s): GLUCAP in the last 168 hours. Lipid Profile: No results for input(s): CHOL, HDL, LDLCALC, TRIG, CHOLHDL, LDLDIRECT in the last 72 hours. Thyroid Function Tests: No results for input(s): TSH, T4TOTAL, FREET4, T3FREE, THYROIDAB in the last 72 hours. Anemia Panel: No results for input(s): VITAMINB12, FOLATE, FERRITIN, TIBC, IRON, RETICCTPCT in the last 72 hours. Urine analysis: No results found for: COLORURINE, APPEARANCEUR, LABSPEC, PHURINE, GLUCOSEU, HGBUR, BILIRUBINUR, KETONESUR, PROTEINUR, UROBILINOGEN, NITRITE, LEUKOCYTESUR  Radiological Exams on Admission: No results found.  EKG: Independently reviewed.  Assessment/Plan Active Problems:   COVID-19 virus infection    Acute hypoxic respiratory failure secondary to acute Covid-19 viral pneumonia during the ongoing 2020 Covid 19 Pandemic - POA Patient presenting to [facility] infusion clinic for treatment with monoclonal antibiotic therapy for recent diagnosis of Covid-19 by his PCP on 05/29/2019.  Positive sick contact with fianc, who is also positive. --Check CMP, CBC, D-dimer, ferritin, CRP, chest x-ray --Start remdesivir, plan 5-day course --Start Decadron 10 mg IV x 1 followed by Decadron 6 mg IV daily --Continue supplemental oxygen, titrate to maintain SPO2 greater than 92% --Continue supportive care with vitamin C, zinc, Tylenol, Combivent  MDI --Incentive spirometer, flutter valve --Follow CBC, CMP, D-dimer, ferritin, and CRP daily --Continue airborne/contact isolation precautions  Essential hypertension Patient with borderline hypotension on admission, BP 104/44.  On lisinopril at home. --Will hold home antihypertensives given borderline blood pressure --Continue monitor BP closely  CKD status post renal transplant 2010 Patient follows with nephrology at [facility], Dr. Freeman Caldron and at [facility] nephrology, Dr. Herminio Commons. Basline Cr 1.6 - 1.8 per PCP Dr. Nevada Crane. --check tacrolimus and mycophenolate level --CMP pending to assess creatinine --Continue home tacrolimus 2 mg p.o. twice daily  --Discussed with nephrology, Dr. Carolin Sicks; will hold Mycophenolate while inpatient; and can resume on discharge; but will need close follow-up with nephrology following discharge. --Holding home prednisone 5 mg p.o. daily in favor of Decadron as above  HLD: Continue statin  Hypothyroidism --Check TSH --Continue levothyroxine 100 mcg p.o. daily  Type 2 diabetes mellitus Patient reports on Tresiba usually 35 units daily, but has increased to 40 units recently as he has been taking steroids. --Check hemoglobin A1c --CMP pending --Insulin sliding scale for coverage --Will start long-acting once; likely at lower home dose once baseline glucose obtained today --Continuous glucose monitoring  Chorea Follows with neurology outpatient.  Recently changed from tetrabenazine to Austedo do to insurance issues per recent neurology note. --Continue Austedo 6mg  PO BID   DVT prophylaxis: Lovenox Code Status: Full code Family Communication: Discussed with patient extensively at bedside Disposition Plan: Anticipate discharge home once medically ready, currently on 6 L nasal cannula (not on oxygen at home), pending PT/OT evaluation, unstable for discharge home at this time due to high FiO2 requirements Consults called:  None Admission status: Inpatient, telemetry   Severity of Illness: The appropriate patient status for this patient is INPATIENT. Inpatient status is judged to be reasonable and necessary in order to provide the required intensity of service to ensure the patient's safety. The patient's presenting symptoms, physical exam findings, and initial radiographic and laboratory data in the context of their chronic comorbidities is felt to place them at high risk for further clinical deterioration. Furthermore, it is not anticipated that the patient will be medically stable for discharge from the hospital within 2 midnights of admission. The following factors support the patient status of inpatient.   " The patient's presenting symptoms include dyspnea, weakness, poor oral intake " The worrisome physical exam findings include borderline hypotension, increased respiratory rate, hypoxia " The initial radiographic and laboratory data are pending at time of direct admission " The chronic co-morbidities include chronic immunosuppression secondary to renal transplant, CKD, hypertension, hyperlipidemia, hypothyroidism, diabetes, chronic hepatitis C   * I certify that at the point of admission it is my clinical judgment that the patient will require inpatient hospital care spanning beyond 2 midnights from the point of admission due to high intensity of service, high risk for further deterioration and  high frequency of surveillance required.*    Montoya Brandel J [place] DO [facility] Available via Epic secure chat 7am-7pm After these hours, please refer to coverage provider listed on [email or web address] 05/31/2019, 11:41 AM

## 2019-06-01 LAB — COMPREHENSIVE METABOLIC PANEL
ALT: 18 U/L (ref 0–44)
AST: 22 U/L (ref 15–41)
Albumin: 2.7 g/dL — ABNORMAL LOW (ref 3.5–5.0)
Alkaline Phosphatase: 67 U/L (ref 38–126)
Anion gap: 9 (ref 5–15)
BUN: 79 mg/dL — ABNORMAL HIGH (ref 8–23)
CO2: 16 mmol/L — ABNORMAL LOW (ref 22–32)
Calcium: 8.4 mg/dL — ABNORMAL LOW (ref 8.9–10.3)
Chloride: 108 mmol/L (ref 98–111)
Creatinine, Ser: 1.96 mg/dL — ABNORMAL HIGH (ref 0.61–1.24)
GFR calc Af Amer: 40 mL/min — ABNORMAL LOW (ref >60)
GFR calc non Af Amer: 35 mL/min — ABNORMAL LOW (ref >60)
Glucose, Bld: 315 mg/dL — ABNORMAL HIGH (ref 70–99)
Potassium: 6.5 mmol/L (ref 3.5–5.1)
Sodium: 133 mmol/L — ABNORMAL LOW (ref 135–145)
Total Bilirubin: 0.5 mg/dL (ref 0.3–1.2)
Total Protein: 5.5 g/dL — ABNORMAL LOW (ref 6.5–8.1)

## 2019-06-01 LAB — CBC WITH DIFFERENTIAL/PLATELET
Abs Immature Granulocytes: 0.11 10*3/uL — ABNORMAL HIGH (ref 0.00–0.07)
Basophils Absolute: 0 10*3/uL (ref 0.0–0.1)
Basophils Relative: 0 %
Eosinophils Absolute: 0 10*3/uL (ref 0.0–0.5)
Eosinophils Relative: 0 %
HCT: 40.3 % (ref 39.0–52.0)
Hemoglobin: 13.1 g/dL (ref 13.0–17.0)
Immature Granulocytes: 1 %
Lymphocytes Relative: 14 %
Lymphs Abs: 1.3 10*3/uL (ref 0.7–4.0)
MCH: 29.7 pg (ref 26.0–34.0)
MCHC: 32.5 g/dL (ref 30.0–36.0)
MCV: 91.4 fL (ref 80.0–100.0)
Monocytes Absolute: 0.7 10*3/uL (ref 0.1–1.0)
Monocytes Relative: 7 %
Neutro Abs: 7.5 10*3/uL (ref 1.7–7.7)
Neutrophils Relative %: 78 %
Platelets: 375 10*3/uL (ref 150–400)
RBC: 4.41 MIL/uL (ref 4.22–5.81)
RDW: 13.3 % (ref 11.5–15.5)
WBC: 9.6 10*3/uL (ref 4.0–10.5)
nRBC: 0 % (ref 0.0–0.2)

## 2019-06-01 LAB — ABO/RH: ABO/RH(D): O POS

## 2019-06-01 LAB — T4, FREE: Free T4: 1.36 ng/dL — ABNORMAL HIGH (ref 0.61–1.12)

## 2019-06-01 LAB — MYCOPHENOLIC ACID (CELLCEPT)
MPA Glucuronide: 91 ug/mL (ref 15–125)
MPA: 9.7 ug/mL — ABNORMAL HIGH (ref 1.0–3.5)

## 2019-06-01 LAB — MAGNESIUM: Magnesium: 2.6 mg/dL — ABNORMAL HIGH (ref 1.7–2.4)

## 2019-06-01 LAB — D-DIMER, QUANTITATIVE: D-Dimer, Quant: 1.95 ug/mL-FEU — ABNORMAL HIGH (ref 0.00–0.50)

## 2019-06-01 LAB — GLUCOSE, CAPILLARY
Glucose-Capillary: 238 mg/dL — ABNORMAL HIGH (ref 70–99)
Glucose-Capillary: 330 mg/dL — ABNORMAL HIGH (ref 70–99)

## 2019-06-01 LAB — POTASSIUM: Potassium: 5.8 mmol/L — ABNORMAL HIGH (ref 3.5–5.1)

## 2019-06-01 LAB — C-REACTIVE PROTEIN: CRP: 4.8 mg/dL — ABNORMAL HIGH (ref <1.0)

## 2019-06-01 LAB — FERRITIN: Ferritin: 564 ng/mL — ABNORMAL HIGH (ref 24–336)

## 2019-06-01 MED ORDER — INSULIN GLARGINE 100 UNIT/ML ~~LOC~~ SOLN
35.0000 [IU] | Freq: Every day | SUBCUTANEOUS | Status: DC
  Administered 2019-06-01 – 2019-06-04 (×4): 35 [IU] via SUBCUTANEOUS
  Filled 2019-06-01 (×4): qty 0.35

## 2019-06-01 MED ORDER — DEUTETRABENAZINE 6 MG PO TABS: 6.0000 mg | ORAL_TABLET | Freq: Two times a day (BID) | ORAL | Status: DC

## 2019-06-01 MED ORDER — INSULIN ASPART 100 UNIT/ML ~~LOC~~ SOLN
5.0000 [IU] | Freq: Three times a day (TID) | SUBCUTANEOUS | Status: DC
  Administered 2019-06-01 – 2019-06-04 (×9): 5 [IU] via SUBCUTANEOUS

## 2019-06-01 MED ORDER — INSULIN GLARGINE 100 UNIT/ML ~~LOC~~ SOLN
15.0000 [IU] | Freq: Once | SUBCUTANEOUS | Status: AC
  Administered 2019-06-01: 08:00:00 15 [IU] via SUBCUTANEOUS
  Filled 2019-06-01: qty 0.15

## 2019-06-01 MED ORDER — SODIUM ZIRCONIUM CYCLOSILICATE 10 G PO PACK
10.0000 g | PACK | Freq: Every day | ORAL | Status: DC
  Filled 2019-06-01: qty 1

## 2019-06-01 MED ORDER — SODIUM ZIRCONIUM CYCLOSILICATE 10 G PO PACK
10.0000 g | PACK | Freq: Two times a day (BID) | ORAL | Status: AC
  Administered 2019-06-01 (×2): 10 g via ORAL
  Filled 2019-06-01 (×2): qty 1

## 2019-06-01 NOTE — Unmapped (Signed)
[facility] Clinical Assessment & Refill Coordination Note    Gary Rogers, : 1955/12/18  Phone: [phone] (home)     All above HIPAA information was verified with patient.     Was a Nurse, learning disability used for this call? No    Specialty Medication(s):   Neurology: [name]     Current Outpatient Medications   Medication Sig Dispense Refill   ??? atorvastatin (LIPITOR) 10 MG tablet Take 10 mg by mouth daily.     ??? deutetrabenazine (AUSTEDO) 6 mg Tab Take 1 tablet by mouth two (2) times a day. 60 tablet 2   ??? dulaglutide (TRULICITY) 1.5 mg/0.5 mL PnIj Inject 1.5 mg under the skin every seven (7) days.     ??? furosemide (LASIX) 20 MG tablet Take 20 mg by mouth daily.      ??? insulin degludec (TRESIBA FLEXTOUCH U-200) 200 unit/mL (3 mL) InPn Inject under the skin daily.      ??? levothyroxine (SYNTHROID, LEVOTHROID) 100 MCG tablet Take 100 mcg by mouth daily at 0600.     ??? lisinopril (PRINIVIL,ZESTRIL) 10 MG tablet Take 10 mg by mouth nightly.   3   ??? mycophenolate (CELLCEPT) 500 mg tablet Take by mouth Two (2) times a day.     ??? predniSONE (DELTASONE) 5 MG tablet Take 5 mg by mouth daily.     ??? tacrolimus (PROGRAF) 0.5 MG capsule Take 2 mg by mouth Two (2) times a day. TAKE 4 CAPSULES BY MOUTH TWICE DAILY       No current facility-administered medications for this visit.         Changes to medications: Rikki reports no changes at this time.    No Known Allergies    Changes to allergies: No    SPECIALTY MEDICATION ADHERENCE     Austedo 6 mg: ~5 days of medicine on hand       Medication Adherence    Patient reported X missed doses in the last month: 3-4  Specialty Medication: Austedo  Patient is on additional specialty medications: No  Informant: patient          Specialty medication(s) dose(s) confirmed: Regimen is correct and unchanged.     Are there any concerns with adherence? No, patient is currently hospitalized with Covid and has not been able to take his medication    Adherence counseling provided? Not needed    CLINICAL MANAGEMENT AND INTERVENTION      Clinical Benefit Assessment:    Do you feel the medicine is effective or helping your condition? Yes, but not quite as effective as tetrabenazine was    Clinical Benefit counseling provided? consulted provider regarding clinical benefit concerns    Adverse Effects Assessment:    Are you experiencing any side effects? No    Are you experiencing difficulty administering your medicine? No    Quality of Life Assessment:    How many days over the past month did your dyskinesias  keep you from your normal activities? For example, brushing your teeth or getting up in the morning. 0    Have you discussed this with your provider? Not needed    Therapy Appropriateness:    Is therapy appropriate? Yes, therapy is appropriate and should be continued    DISEASE/MEDICATION-SPECIFIC INFORMATION      N/A    PATIENT SPECIFIC NEEDS     ? Does the patient have any physical, cognitive, or cultural barriers? No    ? Is the patient  high risk? No     ? Does the patient require a Care Management Plan? No     ? Does the patient require physician intervention or other additional services (i.e. nutrition, smoking cessation, social work)? No      SHIPPING     Specialty Medication(s) to be Shipped:   Neurology: [name]    Other medication(s) to be shipped: none     Changes to insurance: No    Delivery Scheduled: Yes, Expected medication delivery date: 06/04/19.     Medication will be delivered via UPS to the confirmed prescription address in [facility].    The patient will receive a drug information handout for each medication shipped and additional FDA Medication Guides as required.  Verified that patient has previously received a Conservation officer, historic buildings.    All of the patient's questions and concerns have been addressed.    [name]   [facility] Specialty Pharmacist

## 2019-06-01 NOTE — Progress Notes (Signed)
PT Cancellation Note  Patient Details Name: ATREYU MAK MRN: [id number] DOB: [birth year]   Cancelled Treatment:    Reason Eval/Treat Not Completed: Medical issues which prohibited therapy - critical K+ value of 6.5. Will follow-up for PT Evaluation as schedule permits.  Mabeline Caras, PT, DPT Acute Rehabilitation Services  Pager [phone] Office [place] 06/01/2019, 8:07 AM

## 2019-06-01 NOTE — Progress Notes (Signed)
PROGRESS NOTE    Nathan URTON  OXB:[id number] DOB: September 28, 1955 DOA: 05/31/2019 PCP: Celene Squibb, MD    Brief Narrative:   Nathan Tyler is a 64 y.o. male with medical history significant of insulin-dependent diabetes mellitus, chronic hepatitis C, anxiety, chorea, CKD status post renal transplant 2010 on immunosuppression, HLD, HTN, hypothyroidism who presented as a direct admission from the [facility] infusion clinic for progressive shortness of breath in which she was found to be hypoxic on room air.  Patient tested positive for Covid-19 at his PCPs office on 05/29/2019; in which she was being set up for outpatient bamlanivimab or casirivimab\imdevimab when he was found on initial assessment to be pale, dyspneic and hypoxic oxygenating 87% on room air.  Patient states over the past 2 days, has had progressive shortness of breath with nonproductive cough.  Also reports poor oral intake with decreased appetite.  Also reports loss of taste/smell and occasional diarrhea.  Patient reports positive sick contact with Covid-19, his fiance who is currently at home on oxygen therapy.  No other complaints or concerns at this time.  Denies headache, no fever/chills/night sweats, no nausea/vomiting, no chest pain, no palpitations, no abdominal pain.   Assessment & Plan:   Principal Problem:   COVID-19 virus infection Active Problems:   Essential hypertension   CKD (chronic kidney disease)   Renal transplant, status post   HLD (hyperlipidemia)   H/O insulin dependent diabetes mellitus   Chorea  Acute hypoxic respiratory failure secondary to acute Covid-19 viral pneumonia during the ongoing 2020 Covid 19 Pandemic - POA Patient presenting to [facility] infusion clinic for treatment with monoclonal antibiotic therapy for recent diagnosis of Covid-19 by his PCP on 05/29/2019.  Positive sick contact with fianc, who is also positive.  D-dimer 1.97, ferritin 572, CRP 5.7 on admission.  Chest x-ray with interstitial/irregular  opacities bilateral lung bases consistent with likely multifocal pneumonia secondary to Covid-19 infection. --ddimer 1.97-->1.95 --ferritin 572-->564 --CRP 5.7-->4.8 --Continue remdesivir, plan 5-day course; day #2/5 --Continue Decadron 6 mg IV daily; plan 10-day steroid course --Continue supplemental oxygen, titrate to maintain SPO2 greater than 92%; currently titrated down to room air --Continue supportive care with vitamin C, zinc, Tylenol, Combivent MDI --Incentive spirometer, flutter valve --Follow CBC, CMP, D-dimer, ferritin, and CRP daily --Continue airborne/contact isolation precautions --Check ambulatory saturation today  Hyperkalemia K 5.8-->6.5 today --start Lokelma 10mg  PO BID, repeat K this afternoon --Monitor electrolytes daily --Monitor on telemetry  Essential hypertension Patient with borderline hypotension on admission, BP 104/44.  On lisinopril at home. --Will hold home antihypertensives given borderline blood pressure --Continue monitor BP closely, 115/80 this morning  CKD stage IIIb status post renal transplant 2010 Patient follows with nephrology at [facility], Dr. Freeman Caldron and at [facility] nephrology, Dr. Herminio Commons. Basline Cr 1.6 - 1.8 per PCP Dr. Nevada Crane. --check tacrolimus and mycophenolate level --Creatinine 1.95-->1.96 --Continue home tacrolimus 2 mg p.o. twice daily  --Discussed with nephrology, Dr. Carolin Sicks; will hold Mycophenolate while inpatient; and can resume on discharge; but will need close follow-up with nephrology following discharge. --Holding home prednisone 5 mg p.o. daily in favor of Decadron as above  HLD: Continue statin  Hypothyroidism --TSH low at 0.190, pending free T4 --Continue levothyroxine 100 mcg p.o. daily  Type 2 diabetes mellitus Patient reports on Tresiba usually 35 units daily, but has increased to 40 units recently as he has been taking steroids.  Hemoglobin A1c 8.5. --Lantus 35u [place] daily --Insulin  sliding scale for coverage --Continuous  glucose monitoring  Chorea Follows with neurology outpatient.  Recently changed from tetrabenazine to Austedo do to insurance issues per recent neurology note. --Continue Austedo 6mg  PO BID    DVT prophylaxis: Lovenox Code Status: Full code Family Communication: Discussed with patient extensively at bedside Disposition Plan: Continue inpatient, treating for hyperkalemia today, now titrated off of supplemental oxygen at rest, need ambulatory assessment to see exertional oxygen needs, anticipate discharge home in 24-48 hours as long as electrolyte disturbance corrected.   Consultants:   Nephrology - Discussed with Dr. Carolin Sicks on 05/31/2019  Procedures:   none  Covid-19 treatment:   Remdesivir 2/7>>  Decadron 2/7   Subjective:   Objective: Vitals:   05/31/19 1600 05/31/19 2023 06/01/19 0447 06/01/19 0814  BP: 117/82 95/64 115/80 134/77  Pulse: 81 76 68 72  Resp: (!) 23 (!) 33 (!) 26 16  Temp: 98.5 F (36.9 C) (!) 97.2 F (36.2 C) (!) 97.5 F (36.4 C) 98 F (36.7 C)  TempSrc: Axillary Oral Axillary Oral  SpO2: 95% 96% 95% 95%  Weight:      Height:        Intake/Output Summary (Last 24 hours) at 06/01/2019 1029 Last data filed at 06/01/2019 0626 Gross per 24 hour  Intake 355.05 ml  Output 800 ml  Net -444.95 ml   Filed Weights   05/31/19 1131  Weight: 91.2 kg    Examination:  General exam: Appears calm and comfortable  Respiratory system: Clear to auscultation. Respiratory effort normal. Cardiovascular system: S1 & S2 heard, RRR. No JVD, murmurs, rubs, gallops or clicks. No pedal edema. Gastrointestinal system: Abdomen is nondistended, soft and nontender. No organomegaly or masses felt. Normal bowel sounds heard. Central nervous system: Alert and oriented. No focal neurological deficits. Extremities: Symmetric 5 x 5 power. Skin: No rashes, lesions or ulcers Psychiatry: Judgement and insight appear normal. Mood &  affect appropriate.     Data Reviewed: I have personally reviewed following labs and imaging studies  CBC: Recent Labs  Lab 05/31/19 1300 06/01/19 0338  WBC 12.2* 9.6  NEUTROABS 10.9* 7.5  HGB 14.6 13.1  HCT 46.0 40.3  MCV 92.7 91.4  PLT 406* 992   Basic Metabolic Panel: Recent Labs  Lab 05/31/19 1300 06/01/19 0338  NA 137 133*  K 5.8* 6.5*  CL 109 108  CO2 17* 16*  GLUCOSE 293* 315*  BUN 72* 79*  CREATININE 1.95* 1.96*  CALCIUM 8.6* 8.4*  MG  --  2.6*   GFR: Estimated Creatinine Clearance: 43.4 mL/min (A) (by C-G formula based on SCr of 1.96 mg/dL (H)). Liver Function Tests: Recent Labs  Lab 05/31/19 1300 06/01/19 0338  AST 23 22  ALT 16 18  ALKPHOS 72 67  BILITOT 0.4 0.5  PROT 6.4* 5.5*  ALBUMIN 3.2* 2.7*   No results for input(s): LIPASE, AMYLASE in the last 168 hours. No results for input(s): AMMONIA in the last 168 hours. Coagulation Profile: No results for input(s): INR, PROTIME in the last 168 hours. Cardiac Enzymes: No results for input(s): CKTOTAL, CKMB, CKMBINDEX, TROPONINI in the last 168 hours. BNP (last 3 results) No results for input(s): PROBNP in the last 8760 hours. HbA1C: Recent Labs    05/31/19 1300  HGBA1C 8.5*   CBG: Recent Labs  Lab 05/31/19 1213 05/31/19 1751 05/31/19 2021 06/01/19 0807  GLUCAP 290* 282* 383* 238*   Lipid Profile: No results for input(s): CHOL, HDL, LDLCALC, TRIG, CHOLHDL, LDLDIRECT in the last 72 hours. Thyroid Function Tests: Recent Labs  05/31/19 1300 06/01/19 0338  TSH 0.190*  --   FREET4  --  1.36*   Anemia Panel: Recent Labs    05/31/19 1300 06/01/19 0338  FERRITIN 572* 564*   Sepsis Labs: Recent Labs  Lab 05/31/19 1300  PROCALCITON <0.10    No results found for this or any previous visit (from the past 240 hour(s)).       Radiology Studies: Portable chest 1 View  Result Date: 05/31/2019 CLINICAL DATA:  Dyspnea EXAM: PORTABLE CHEST 1 VIEW COMPARISON:  None. FINDINGS:  The heart size and mediastinal contours are within normal limits. Interstitial and irregular opacity of the bilateral lung bases. The visualized skeletal structures are unremarkable. IMPRESSION: Interstitial and irregular opacity of the bilateral lung bases, which may reflect edema or infection. There may be a component of underlying scarring or fibrosis. Electronically Signed   By: Eddie Candle M.D.   On: 05/31/2019 13:23        Scheduled Meds: . vitamin C  500 mg Oral Daily  . atorvastatin  10 mg Oral q1800  . dexamethasone (DECADRON) injection  6 mg Intravenous Q24H  . enoxaparin (LOVENOX) injection  40 mg Subcutaneous Q24H  . insulin aspart  0-15 Units Subcutaneous TID WC  . insulin aspart  0-5 Units Subcutaneous QHS  . insulin aspart  5 Units Subcutaneous TID WC  . insulin glargine  35 Units Subcutaneous Daily  . Ipratropium-Albuterol  1 puff Inhalation Q6H  . levothyroxine  100 mcg Oral QAC breakfast  . linagliptin  5 mg Oral Daily  . sodium zirconium cyclosilicate  10 g Oral BID WC  . tacrolimus  1.5 mg Oral BID  . zinc sulfate  220 mg Oral Daily   Continuous Infusions: . remdesivir 100 mg in NS 100 mL 100 mg (06/01/19 0934)     LOS: 1 day    Time spent: 38 minutes spent on chart review, discussion with nursing staff, consultants, updating family and interview/physical exam; more than 50% of that time was spent in counseling and/or coordination of care.    Elvyn Krohn J [place] DO [facility] Available via Epic secure chat 7am-7pm After these hours, please refer to coverage provider listed on [email or web address] 06/01/2019, 10:29 AM

## 2019-06-01 NOTE — Progress Notes (Signed)
SATURATION QUALIFICATIONS: (This note is used to comply with regulatory documentation for home oxygen)  Patient Saturations on Room Air at Rest = 94%  Patient Saturations on Room Air while Ambulating = 82%  Patient Saturations on 3 Liters of oxygen while Ambulating = 92%  Please briefly explain why patient needs home oxygen: Once patient starts ambulating for more than a minute his O2 saturations decrease dramatically, going from the mid 90's to the low 80's on room air. It took 3 liters [place] of oxygen to maintain his oxygen levels in the low 90's. The patient walked one time around the unit requiring multiple breaks and 3 liters of O2 to maintain an acceptable O2 saturation level.

## 2019-06-01 NOTE — Progress Notes (Signed)
OT Cancellation Note  Patient Details Name: Nathan Tyler MRN: [id number] DOB: October 19, 1955   Cancelled Treatment:    Reason Eval/Treat Not Completed: Medical issues which prohibited therapy. K+ critical value 6.5. Will assess when medically appropriate.   Kaeley Vinje,HILLARY 06/01/2019, 8:14 AM  Maurie Boettcher, OT/L   Acute OT Clinical Specialist Acute Rehabilitation Services Pager [phone] Office [phone]

## 2019-06-02 LAB — COMPREHENSIVE METABOLIC PANEL
ALT: 19 U/L (ref 0–44)
AST: 25 U/L (ref 15–41)
Albumin: 2.7 g/dL — ABNORMAL LOW (ref 3.5–5.0)
Alkaline Phosphatase: 71 U/L (ref 38–126)
Anion gap: 9 (ref 5–15)
BUN: 81 mg/dL — ABNORMAL HIGH (ref 8–23)
CO2: 17 mmol/L — ABNORMAL LOW (ref 22–32)
Calcium: 8.8 mg/dL — ABNORMAL LOW (ref 8.9–10.3)
Chloride: 112 mmol/L — ABNORMAL HIGH (ref 98–111)
Creatinine, Ser: 1.92 mg/dL — ABNORMAL HIGH (ref 0.61–1.24)
GFR calc Af Amer: 41 mL/min — ABNORMAL LOW (ref >60)
GFR calc non Af Amer: 36 mL/min — ABNORMAL LOW (ref >60)
Glucose, Bld: 64 mg/dL — ABNORMAL LOW (ref 70–99)
Potassium: 5.3 mmol/L — ABNORMAL HIGH (ref 3.5–5.1)
Sodium: 138 mmol/L (ref 135–145)
Total Bilirubin: 0.7 mg/dL (ref 0.3–1.2)
Total Protein: 5.6 g/dL — ABNORMAL LOW (ref 6.5–8.1)

## 2019-06-02 LAB — C-REACTIVE PROTEIN: CRP: 3.3 mg/dL — ABNORMAL HIGH (ref <1.0)

## 2019-06-02 LAB — CBC WITH DIFFERENTIAL/PLATELET
Abs Immature Granulocytes: 0.13 10*3/uL — ABNORMAL HIGH (ref 0.00–0.07)
Basophils Absolute: 0 10*3/uL (ref 0.0–0.1)
Basophils Relative: 0 %
Eosinophils Absolute: 0 10*3/uL (ref 0.0–0.5)
Eosinophils Relative: 0 %
HCT: 41.4 % (ref 39.0–52.0)
Hemoglobin: 14 g/dL (ref 13.0–17.0)
Immature Granulocytes: 1 %
Lymphocytes Relative: 14 %
Lymphs Abs: 2 10*3/uL (ref 0.7–4.0)
MCH: 30.2 pg (ref 26.0–34.0)
MCHC: 33.8 g/dL (ref 30.0–36.0)
MCV: 89.2 fL (ref 80.0–100.0)
Monocytes Absolute: 1.2 10*3/uL — ABNORMAL HIGH (ref 0.1–1.0)
Monocytes Relative: 8 %
Neutro Abs: 11.1 10*3/uL — ABNORMAL HIGH (ref 1.7–7.7)
Neutrophils Relative %: 77 %
Platelets: 427 10*3/uL — ABNORMAL HIGH (ref 150–400)
RBC: 4.64 MIL/uL (ref 4.22–5.81)
RDW: 13.5 % (ref 11.5–15.5)
WBC: 14.5 10*3/uL — ABNORMAL HIGH (ref 4.0–10.5)
nRBC: 0 % (ref 0.0–0.2)

## 2019-06-02 LAB — TACROLIMUS LEVEL: Tacrolimus (FK506) - LabCorp: 14.9 ng/mL (ref 2.0–20.0)

## 2019-06-02 LAB — GLUCOSE, CAPILLARY
Glucose-Capillary: 384 mg/dL — ABNORMAL HIGH (ref 70–99)
Glucose-Capillary: 359 mg/dL — ABNORMAL HIGH (ref 70–99)
Glucose-Capillary: 109 mg/dL — ABNORMAL HIGH (ref 70–99)
Glucose-Capillary: 105 mg/dL — ABNORMAL HIGH (ref 70–99)
Glucose-Capillary: 118 mg/dL — ABNORMAL HIGH (ref 70–99)
Glucose-Capillary: 268 mg/dL — ABNORMAL HIGH (ref 70–99)
Glucose-Capillary: 339 mg/dL — ABNORMAL HIGH (ref 70–99)

## 2019-06-02 LAB — D-DIMER, QUANTITATIVE: D-Dimer, Quant: 3.09 ug/mL-FEU — ABNORMAL HIGH (ref 0.00–0.50)

## 2019-06-02 LAB — MAGNESIUM: Magnesium: 2.7 mg/dL — ABNORMAL HIGH (ref 1.7–2.4)

## 2019-06-02 LAB — FERRITIN: Ferritin: 454 ng/mL — ABNORMAL HIGH (ref 24–336)

## 2019-06-02 MED ORDER — SODIUM ZIRCONIUM CYCLOSILICATE 10 G PO PACK
10.0000 g | PACK | Freq: Two times a day (BID) | ORAL | Status: AC
  Administered 2019-06-02 (×2): 10 g via ORAL
  Filled 2019-06-02 (×3): qty 1

## 2019-06-02 NOTE — Progress Notes (Signed)
Occupational Therapy Evaluation Patient Details Name: Nathan Tyler MRN: [id number] DOB: 27-Mar-1956 Today's Date: 06/02/2019    History of Present Illness Pt is a 64 y.o. male directly admitted from [place] infusion clinic on 05/31/19 with progressive SOB, found to be hypoxic; pt tested (+) COVID-19 on 2/5. PMH includes neuromuscular disorder, chorea, DM, CKD (s/p renal transplant 2010), R foot 5th and L 2nd toe amputations.   Clinical Impression   Patient very kind, he has chorea and is typically independent without assistive devices at home.  Patient works part time and drives.  He completed transfers and mobility with min guard.  After a couple min of standing ADL patient felt dizzy and SpO2 had dropped to 79 while on room air.  Recovered quickly to high 80s once seated. Educated on pursed lip breathing and practiced extensively, he had difficulty as he said is a "mouth breather."  Patient would benefit from OT to increase activity tolerance and energy conservation evidenced by desat with mobility and fatigue after standing ADLs.  Completed flutter valve and incentive spirometer (pulled 250).  Will continue to follow with OT acutely.     Follow Up Recommendations  Home health OT    Equipment Recommendations  3 in 1 bedside commode    Recommendations for Other Services       Precautions / Restrictions Precautions Precautions: Fall Precaution Comments: Chorea; Desats Restrictions Weight Bearing Restrictions: No Other Position/Activity Restrictions: Monitor closely due to his constant spastic/ataxic type movements. He relates that he does not use any AD for ambulation- and that he has not had any falls.      Mobility Bed Mobility Overal bed mobility: Modified Independent                Transfers Overall transfer level: Needs assistance Equipment used: Rolling walker (2 wheeled) Transfers: Sit to/from Stand Sit to Stand: Min guard              Balance Overall balance  assessment: Mild deficits observed, not formally tested                                         ADL either performed or assessed with clinical judgement   ADL Overall ADL's : Needs assistance/impaired Eating/Feeding: Set up;Sitting   Grooming: Oral care;Wash/dry face;Min guard;Standing   Upper Body Bathing: Supervision/ safety;Sitting   Lower Body Bathing: Minimal assistance;Sit to/from stand   Upper Body Dressing : Set up;Sitting   Lower Body Dressing: Minimal assistance;Sit to/from stand   Toilet Transfer: Min guard           Functional mobility during ADLs: Surveyor, minerals     Praxis      Pertinent Vitals/Pain Pain Assessment: No/denies pain     Hand Dominance Right   Extremity/Trunk Assessment Upper Extremity Assessment Upper Extremity Assessment: Generalized weakness   Lower Extremity Assessment Lower Extremity Assessment: Generalized weakness(Hypertonicity- but does have Huntington's Chorea)       Communication Communication Communication: No difficulties;Other (comment)(Slight expressive difficulty due to chorea)   Cognition Arousal/Alertness: Awake/alert Behavior During Therapy: WFL for tasks assessed/performed Overall Cognitive Status: Within Functional Limits for tasks assessed  General Comments  Monitor closely for skin and joint integrity- as well as general safety    Exercises Exercises: Other exercises Other Exercises Other Exercises: x10 incentive spirometer Other Exercises: x10 flutter valve   Shoulder Instructions      Home Living Family/patient expects to be discharged to:: Private residence Living Arrangements: Spouse/significant other Available Help at Discharge: Family Type of Home: House       Home Layout: One level     Bathroom Shower/Tub: Occupational psychologist: Standard Bathroom  Accessibility: Yes   Home Equipment: None   Additional Comments: He states that his fiancee is on oxygen at home in [place] and has COVID. He indicates he is sure he was exposed to Covid by her.      Prior Functioning/Environment Level of Independence: Independent        Comments: States he required no equipment at PLOF, though chorea is significant        OT Problem List: Decreased strength;Decreased activity tolerance;Impaired balance (sitting and/or standing);Decreased coordination;Cardiopulmonary status limiting activity;Impaired tone      OT Treatment/Interventions: Self-care/ADL training;Therapeutic exercise;Energy conservation;Therapeutic activities;Patient/family education;Balance training    OT Goals(Current goals can be found in the care plan section) Acute Rehab OT Goals Patient Stated Goal: Just want to get well and get home safely OT Goal Formulation: With patient Time For Goal Achievement: 06/16/19 Potential to Achieve Goals: Good  OT Frequency: Min 3X/week   Barriers to D/C:            Co-evaluation              AM-PAC OT "6 Clicks" Daily Activity     Outcome Measure Help from another person eating meals?: A Little Help from another person taking care of personal grooming?: A Little Help from another person toileting, which includes using toliet, bedpan, or urinal?: A Little Help from another person bathing (including washing, rinsing, drying)?: A Little Help from another person to put on and taking off regular upper body clothing?: A Little Help from another person to put on and taking off regular lower body clothing?: A Little 6 Click Score: 18   End of Session Equipment Utilized During Treatment: Rolling walker Nurse Communication: Mobility status  Activity Tolerance: Patient tolerated treatment well Patient left: in chair;with call bell/phone within reach  OT Visit Diagnosis: Unsteadiness on feet (R26.81);Other abnormalities of gait and  mobility (R26.89);Muscle weakness (generalized) (M62.81);Ataxia, unspecified (R27.0);Other symptoms and signs involving the nervous system (R29.898)                Time: 8832-5498 OT Time Calculation (min): 27 min Charges:  OT General Charges $OT Visit: 1 Visit OT Evaluation $OT Eval Moderate Complexity: 1 Mod OT Treatments $Self Care/Home Management : 8-22 mins  [name], OTR/L   Phylliss Bob 06/02/2019, 12:29 PM

## 2019-06-02 NOTE — Evaluation (Signed)
Physical Therapy Evaluation Patient Details Name: Nathan Tyler MRN: [id number] DOB: [birth year] Today's Date: 06/02/2019   History of Present Illness  Pt is a 64 y.o. male directly admitted from [place] infusion clinic on 05/31/19 with progressive SOB, found to be hypoxic; pt tested (+) COVID-19 on 2/5. PMH includes neuromuscular disorder, chorea, DM, CKD (s/p renal transplant 2010), R foot 5th and L 2nd toe amputations.  Clinical Impression  Very pleasant male patient. He does have Huntington's Chorea and reduced safety awareness. He relates that he has not had any falls and uses no AD at home. He actually drove himself here for appt related to infusion. He lives with fiancee and she is COVID positive and on oxygen/in quarantine currently. He states that he is sure that he was exposed to Covid by her. He was working until his job shut down due to [employer]. House is one level, no outside steps. He reports being totally independent in all ADLs. Should benefit from PT to address goals in accordance with progression to optimal functional outcomes.    Follow Up Recommendations Home health PT    Equipment Recommendations  Rolling walker with 5" wheels    Recommendations for Other Services       Precautions / Restrictions Precautions Precautions: Fall Restrictions Weight Bearing Restrictions: No Other Position/Activity Restrictions: Monitor closely due to his constant spastic/ataxic type movements. He relates that he does not use any AD for ambulation- and that he has not had any falls.      Mobility  Bed Mobility Overal bed mobility: Modified Independent                Transfers Overall transfer level: Needs assistance Equipment used: Rolling walker (2 wheeled) Transfers: Sit to/from Stand Sit to Stand: Min guard            Ambulation/Gait Ambulation/Gait assistance: Min guard Gait Distance (Feet): 10 Feet Assistive device: Rolling walker (2 wheeled) Gait Pattern/deviations:  Ataxic   Gait velocity interpretation: 1.31 - 2.62 ft/sec, indicative of limited community ambulator General Gait Details: Needs reminders for safety with RW - he exhibits reduced safety awareness  Stairs            Wheelchair Mobility    Modified Rankin (Stroke Patients Only)       Balance Overall balance assessment: Mild deficits observed, not formally tested(Huntington's Chorea)                                           Pertinent Vitals/Pain Pain Assessment: No/denies pain    Home Living Family/patient expects to be discharged to:: Private residence Living Arrangements: Spouse/significant other Available Help at Discharge: Family Type of Home: House       Home Layout: One level Home Equipment: None Additional Comments: He states that his fiancee is on oxygen at home in [place] and has COVID. He indicates he is sure he was exposed to Covid by her.    Prior Function Level of Independence: Independent               Hand Dominance   Dominant Hand: Right    Extremity/Trunk Assessment        Lower Extremity Assessment Lower Extremity Assessment: Generalized weakness(Hypertonicity- but does have Huntington's Chorea)       Communication   Communication: No difficulties  Cognition Arousal/Alertness: Awake/alert Behavior During Therapy: WFL for tasks assessed/performed Overall  Cognitive Status: Within Functional Limits for tasks assessed                                        General Comments General comments (skin integrity, edema, etc.): Monitor closely for skin and joint integrity- as well as general safety    Exercises Other Exercises Other Exercises: Practiced IS and needs additional guidance to use properly Other Exercises: Practiced Flutter Valve unit- reminders to slow down to use effectively   Assessment/Plan    PT Assessment Patient needs continued PT services  PT Problem List Decreased  strength;Decreased activity tolerance;Decreased mobility;Decreased safety awareness       PT Treatment Interventions Gait training;DME instruction;Functional mobility training;Therapeutic activities;Therapeutic exercise;Patient/family education    PT Goals (Current goals can be found in the Care Plan section)  Acute Rehab PT Goals Patient Stated Goal: Just want to get well and get home safely PT Goal Formulation: With patient Time For Goal Achievement: 06/16/19 Potential to Achieve Goals: Good    Frequency Min 3X/week   Barriers to discharge        Co-evaluation               AM-PAC PT "6 Clicks" Mobility  Outcome Measure Help needed turning from your back to your side while in a flat bed without using bedrails?: None Help needed moving from lying on your back to sitting on the side of a flat bed without using bedrails?: A Little Help needed moving to and from a bed to a chair (including a wheelchair)?: A Little Help needed standing up from a chair using your arms (e.g., wheelchair or bedside chair)?: A Little Help needed to walk in hospital room?: A Little Help needed climbing 3-5 steps with a railing? : A Lot 6 Click Score: 18    End of Session   Activity Tolerance: Patient tolerated treatment well Patient left: in chair;with call bell/phone within reach Nurse Communication: Mobility status PT Visit Diagnosis: Unsteadiness on feet (R26.81);Other abnormalities of gait and mobility (R26.89);Muscle weakness (generalized) (M62.81)    Time: 8299-3716 PT Time Calculation (min) (ACUTE ONLY): 54 min   Charges:   PT Evaluation $PT Eval Moderate Complexity: 1 Mod PT Treatments $Gait Training: 8-22 mins $Therapeutic Exercise: 8-22 mins $Therapeutic Activity: 8-22 mins        Rollen Sox, PT # [phone] CGV cell  Casandra Doffing 06/02/2019, 11:16 AM

## 2019-06-02 NOTE — Progress Notes (Addendum)
PROGRESS NOTE    Nathan Tyler DOB: [birth year] DOA: 05/31/2019 PCP: Celene Squibb, MD    Brief Narrative:   Nathan Tyler is a 64 y.o. male with medical history significant of insulin-dependent diabetes mellitus, chronic hepatitis C, anxiety, chorea, CKD status post renal transplant 2010 on immunosuppression, HLD, HTN, hypothyroidism who presented as a direct admission from the [facility] infusion clinic for progressive shortness of breath in which she was found to be hypoxic on room air.  Patient tested positive for Covid-19 at his PCPs office on 05/29/2019; in which she was being set up for outpatient bamlanivimab or casirivimab\imdevimab when he was found on initial assessment to be pale, dyspneic and hypoxic oxygenating 87% on room air.  Patient states over the past 2 days, has had progressive shortness of breath with nonproductive cough.  Also reports poor oral intake with decreased appetite.  Also reports loss of taste/smell and occasional diarrhea.  Patient reports positive sick contact with Covid-19, his fiance who is currently at home on oxygen therapy.  No other complaints or concerns at this time.  Denies headache, no fever/chills/night sweats, no nausea/vomiting, no chest pain, no palpitations, no abdominal pain.   Assessment & Plan:   Principal Problem:   COVID-19 virus infection Active Problems:   Essential hypertension   CKD (chronic kidney disease)   Renal transplant, status post   HLD (hyperlipidemia)   H/O insulin dependent diabetes mellitus   Chorea  Acute hypoxic respiratory failure secondary to acute Covid-19 viral pneumonia during the ongoing 2020 Covid 19 Pandemic - POA Patient presenting to [facility] infusion clinic for treatment with monoclonal antibiotic therapy for recent diagnosis of Covid-19 by his PCP on 05/29/2019.  Positive sick contact with fianc, who is also positive.  D-dimer 1.97, ferritin 572, CRP 5.7 on admission.  Chest x-ray with interstitial/irregular  opacities bilateral lung bases consistent with likely multifocal pneumonia secondary to Covid-19 infection. --ddimer 1.97-->1.95-->3.09 --ferritin 572-->564-->454 --CRP 5.7-->4.8-->3.3 --Continue remdesivir, plan 5-day course; day #3/5 --Continue Decadron 6 mg IV daily; plan 10-day steroid course --Continue supplemental oxygen, titrate to maintain SPO2 greater than 92%; currently titrated down to room air; but required 3 L per nasal cannula with ambulation on 06/01/2019 --Continue supportive care with vitamin C, zinc, Tylenol, Combivent MDI --Incentive spirometer, flutter valve --Follow CBC, CMP, D-dimer, ferritin, and CRP daily --Continue airborne/contact isolation precautions  Hyperkalemia K 5.8-->6.5-->5.3 today --continue Lokelma 10mg  PO BID  --Monitor electrolytes daily --Monitor on telemetry  Essential hypertension Patient with borderline hypotension on admission, BP 104/44.  On lisinopril at home. --Will hold home antihypertensives given borderline blood pressure --Continue monitor BP closely, 122/77 this morning  CKD stage IIIb status post renal transplant 2010 Patient follows with nephrology at [facility], Dr. Freeman Caldron and at [facility] nephrology, Dr. Herminio Commons. Basline Cr 1.6 - 1.8 per PCP Dr. [place] Crane. --check tacrolimus and mycophenolate level --Creatinine 1.95-->1.96-->1.92 --Continue home tacrolimus 2 mg p.o. twice daily  --Discussed with nephrology, Dr. Carolin Sicks; will hold Mycophenolate while inpatient; and can resume on discharge; but will need close follow-up with nephrology following discharge. --Holding home prednisone 5 mg p.o. daily in favor of Decadron as above  HLD: Continue statin  Hypothyroidism --TSH low at 0.190, Free T4 1.36 --Continue levothyroxine 100 mcg p.o. daily --Will defer any adjustments to PCP  Type 2 diabetes mellitus Patient reports on Tresiba usually 35 units daily, but has increased to 40 units recently as he has been  taking steroids.  Hemoglobin A1c 8.5. --  Lantus 35u [place] daily --Novolog 5u [place] TIDAC --Tradjenta 5 mg p.o. daily --Insulin sliding scale for coverage --Continuous glucose monitoring  Chorea Follows with neurology outpatient.  Recently changed from tetrabenazine to Austedo do to insurance issues per recent neurology note. --Continue Austedo 6mg  PO BID    DVT prophylaxis: Lovenox Code Status: Full code Family Communication: Discussed with patient extensively at bedside Disposition Plan: Continue inpatient, treating for hyperkalemia today, now titrated off of supplemental oxygen at rest, continues with O2 need with ambulation, anticipate discharge home in 48 hours as long as electrolyte disturbance corrected and completion of remdesivir infusion.   Consultants:   Nephrology - Discussed with Dr. Carolin Sicks on 05/31/2019  Procedures:   none  Covid-19 treatment:   Remdesivir 2/7>>  Decadron 2/7   Subjective: Patient seen and examined at bedside, resting comfortably.  States breathing slightly improved today.  Required 3 L nasal cannula while ambulating yesterday.  Currently on room air.  Patient states will have difficult time getting to infusion center if he were to discharge and wishes to remain hospitalized until completion of his remdesivir.  No other complaints or concerns at this time.  Denies headache, no chest pain, no palpitations, no fever/chills/night sweats, no cough/congestion, no abdominal pain.  No acute events overnight per nursing staff.  Objective: Vitals:   06/02/19 0636 06/02/19 0637 06/02/19 0729 06/02/19 1059  BP:   113/61   Pulse: 80 80 79   Resp: (!) 25 (!) 23 20   Temp:   98.6 F (37 C)   TempSrc:   Axillary   SpO2: 93% 94% 93% 91%  Weight:      Height:        Intake/Output Summary (Last 24 hours) at 06/02/2019 1146 Last data filed at 06/02/2019 0130 Gross per 24 hour  Intake 750 ml  Output 2160 ml  Net -1410 ml   Filed Weights   05/31/19 1131   Weight: 91.2 kg    Examination:  General exam: Appears calm and comfortable  Respiratory system: Clear to auscultation. Respiratory effort normal.  On room air at rest, requires 3 L nasal cannula with ambulation Cardiovascular system: S1 & S2 heard, RRR. No JVD, murmurs, rubs, gallops or clicks. No pedal edema. Gastrointestinal system: Abdomen is nondistended, soft and nontender. No organomegaly or masses felt. Normal bowel sounds heard. Central nervous system: Alert and oriented. No focal neurological deficits. Extremities: Symmetric 5 x 5 power. Skin: No rashes, lesions or ulcers Psychiatry: Judgement and insight appear normal. Mood & affect appropriate.     Data Reviewed: I have personally reviewed following labs and imaging studies  CBC: Recent Labs  Lab 05/31/19 1300 06/01/19 0338 06/02/19 0428  WBC 12.2* 9.6 14.5*  NEUTROABS 10.9* 7.5 11.1*  HGB 14.6 13.1 14.0  HCT 46.0 40.3 41.4  MCV 92.7 91.4 89.2  PLT 406* 375 115*   Basic Metabolic Panel: Recent Labs  Lab 05/31/19 1300 06/01/19 0338 06/01/19 1350 06/02/19 0428  NA 137 133*  --  138  K 5.8* 6.5* 5.8* 5.3*  CL 109 108  --  112*  CO2 17* 16*  --  17*  GLUCOSE 293* 315*  --  64*  BUN 72* 79*  --  81*  CREATININE 1.95* 1.96*  --  1.92*  CALCIUM 8.6* 8.4*  --  8.8*  MG  --  2.6*  --  2.7*   GFR: Estimated Creatinine Clearance: 44.3 mL/min (A) (by C-G formula based on SCr of 1.92 mg/dL (H)). Liver Function  Tests: Recent Labs  Lab 05/31/19 1300 06/01/19 0338 06/02/19 0428  AST 23 22 25   ALT 16 18 19   ALKPHOS 72 67 71  BILITOT 0.4 0.5 0.7  PROT 6.4* 5.5* 5.6*  ALBUMIN 3.2* 2.7* 2.7*   No results for input(s): LIPASE, AMYLASE in the last 168 hours. No results for input(s): AMMONIA in the last 168 hours. Coagulation Profile: No results for input(s): INR, PROTIME in the last 168 hours. Cardiac Enzymes: No results for input(s): CKTOTAL, CKMB, CKMBINDEX, TROPONINI in the last 168 hours. BNP (last 3  results) No results for input(s): PROBNP in the last 8760 hours. HbA1C: Recent Labs    05/31/19 1300  HGBA1C 8.5*   CBG: Recent Labs  Lab 06/01/19 1254 06/01/19 1723 06/01/19 1936 06/01/19 2240 06/02/19 0728  GLUCAP 330* 384* 359* 109* 105*   Lipid Profile: No results for input(s): CHOL, HDL, LDLCALC, TRIG, CHOLHDL, LDLDIRECT in the last 72 hours. Thyroid Function Tests: Recent Labs    05/31/19 1300 06/01/19 0338  TSH 0.190*  --   FREET4  --  1.36*   Anemia Panel: Recent Labs    06/01/19 0338 06/02/19 0428  FERRITIN 564* 454*   Sepsis Labs: Recent Labs  Lab 05/31/19 1300  PROCALCITON <0.10    No results found for this or any previous visit (from the past 240 hour(s)).       Radiology Studies: Portable chest 1 View  Result Date: 05/31/2019 CLINICAL DATA:  Dyspnea EXAM: PORTABLE CHEST 1 VIEW COMPARISON:  None. FINDINGS: The heart size and mediastinal contours are within normal limits. Interstitial and irregular opacity of the bilateral lung bases. The visualized skeletal structures are unremarkable. IMPRESSION: Interstitial and irregular opacity of the bilateral lung bases, which may reflect edema or infection. There may be a component of underlying scarring or fibrosis. Electronically Signed   By: Eddie Candle M.D.   On: 05/31/2019 13:23        Scheduled Meds: . vitamin C  500 mg Oral Daily  . atorvastatin  10 mg Oral q1800  . Deutetrabenazine  6 mg Oral BID  . dexamethasone (DECADRON) injection  6 mg Intravenous Q24H  . enoxaparin (LOVENOX) injection  40 mg Subcutaneous Q24H  . insulin aspart  0-15 Units Subcutaneous TID WC  . insulin aspart  0-5 Units Subcutaneous QHS  . insulin aspart  5 Units Subcutaneous TID WC  . insulin glargine  35 Units Subcutaneous Daily  . Ipratropium-Albuterol  1 puff Inhalation Q6H  . levothyroxine  100 mcg Oral QAC breakfast  . linagliptin  5 mg Oral Daily  . sodium zirconium cyclosilicate  10 g Oral BID WC  .  tacrolimus  1.5 mg Oral BID  . zinc sulfate  220 mg Oral Daily   Continuous Infusions: . remdesivir 100 mg in NS 100 mL 100 mg (06/02/19 0812)     LOS: 2 days    Time spent: 36 minutes spent on chart review, discussion with nursing staff, consultants, updating family and interview/physical exam; more than 50% of that time was spent in counseling and/or coordination of care.    Adel Burch J [place] DO [facility] Available via Epic secure chat 7am-7pm After these hours, please refer to coverage provider listed on [email or web address] 06/02/2019, 11:46 AM

## 2019-06-03 ENCOUNTER — Ambulatory Visit: Payer: Medicare Other

## 2019-06-03 ENCOUNTER — Inpatient Hospital Stay (HOSPITAL_COMMUNITY): Payer: Medicare Other

## 2019-06-03 DIAGNOSIS — J9601 Acute respiratory failure with hypoxia: Secondary | ICD-10-CM

## 2019-06-03 DIAGNOSIS — E039 Hypothyroidism, unspecified: Secondary | ICD-10-CM | POA: Diagnosis present

## 2019-06-03 DIAGNOSIS — N1832 Chronic kidney disease, stage 3b: Secondary | ICD-10-CM

## 2019-06-03 DIAGNOSIS — E663 Overweight: Secondary | ICD-10-CM | POA: Diagnosis present

## 2019-06-03 DIAGNOSIS — I10 Essential (primary) hypertension: Secondary | ICD-10-CM

## 2019-06-03 HISTORY — DX: Acute respiratory failure with hypoxia: J96.01

## 2019-06-03 LAB — CBC WITH DIFFERENTIAL/PLATELET
Abs Immature Granulocytes: 0.16 10*3/uL — ABNORMAL HIGH (ref 0.00–0.07)
Basophils Absolute: 0 10*3/uL (ref 0.0–0.1)
Basophils Relative: 0 %
Eosinophils Absolute: 0 10*3/uL (ref 0.0–0.5)
Eosinophils Relative: 0 %
HCT: 38.3 % — ABNORMAL LOW (ref 39.0–52.0)
Hemoglobin: 12.8 g/dL — ABNORMAL LOW (ref 13.0–17.0)
Immature Granulocytes: 1 %
Lymphocytes Relative: 12 %
Lymphs Abs: 1.7 10*3/uL (ref 0.7–4.0)
MCH: 29.6 pg (ref 26.0–34.0)
MCHC: 33.4 g/dL (ref 30.0–36.0)
MCV: 88.7 fL (ref 80.0–100.0)
Monocytes Absolute: 1 10*3/uL (ref 0.1–1.0)
Monocytes Relative: 7 %
Neutro Abs: 11.4 10*3/uL — ABNORMAL HIGH (ref 1.7–7.7)
Neutrophils Relative %: 80 %
Platelets: 399 10*3/uL (ref 150–400)
RBC: 4.32 MIL/uL (ref 4.22–5.81)
RDW: 13.3 % (ref 11.5–15.5)
WBC: 14.3 10*3/uL — ABNORMAL HIGH (ref 4.0–10.5)
nRBC: 0 % (ref 0.0–0.2)

## 2019-06-03 LAB — COMPREHENSIVE METABOLIC PANEL
ALT: 17 U/L (ref 0–44)
AST: 24 U/L (ref 15–41)
Albumin: 2.6 g/dL — ABNORMAL LOW (ref 3.5–5.0)
Alkaline Phosphatase: 71 U/L (ref 38–126)
Anion gap: 7 (ref 5–15)
BUN: 79 mg/dL — ABNORMAL HIGH (ref 8–23)
CO2: 16 mmol/L — ABNORMAL LOW (ref 22–32)
Calcium: 8.5 mg/dL — ABNORMAL LOW (ref 8.9–10.3)
Chloride: 114 mmol/L — ABNORMAL HIGH (ref 98–111)
Creatinine, Ser: 1.8 mg/dL — ABNORMAL HIGH (ref 0.61–1.24)
GFR calc Af Amer: 45 mL/min — ABNORMAL LOW (ref >60)
GFR calc non Af Amer: 39 mL/min — ABNORMAL LOW (ref >60)
Glucose, Bld: 58 mg/dL — ABNORMAL LOW (ref 70–99)
Potassium: 5.3 mmol/L — ABNORMAL HIGH (ref 3.5–5.1)
Sodium: 137 mmol/L (ref 135–145)
Total Bilirubin: 0.5 mg/dL (ref 0.3–1.2)
Total Protein: 5.5 g/dL — ABNORMAL LOW (ref 6.5–8.1)

## 2019-06-03 LAB — GLUCOSE, CAPILLARY
Glucose-Capillary: 59 mg/dL — ABNORMAL LOW (ref 70–99)
Glucose-Capillary: 90 mg/dL (ref 70–99)
Glucose-Capillary: 50 mg/dL — ABNORMAL LOW (ref 70–99)
Glucose-Capillary: 234 mg/dL — ABNORMAL HIGH (ref 70–99)
Glucose-Capillary: 211 mg/dL — ABNORMAL HIGH (ref 70–99)

## 2019-06-03 LAB — FERRITIN: Ferritin: 397 ng/mL — ABNORMAL HIGH (ref 24–336)

## 2019-06-03 LAB — MAGNESIUM: Magnesium: 2.3 mg/dL (ref 1.7–2.4)

## 2019-06-03 LAB — D-DIMER, QUANTITATIVE: D-Dimer, Quant: 2.25 ug/mL-FEU — ABNORMAL HIGH (ref 0.00–0.50)

## 2019-06-03 LAB — C-REACTIVE PROTEIN: CRP: 5.2 mg/dL — ABNORMAL HIGH (ref <1.0)

## 2019-06-03 MED ORDER — IOHEXOL 350 MG/ML SOLN
75.0000 mL | Freq: Once | INTRAVENOUS | Status: AC | PRN
  Administered 2019-06-03: 09:00:00 75 mL via INTRAVENOUS

## 2019-06-03 MED ORDER — SODIUM CHLORIDE 0.9 % IV SOLN
2.0000 g | Freq: Two times a day (BID) | INTRAVENOUS | Status: AC
  Administered 2019-06-03 – 2019-06-07 (×10): 2 g via INTRAVENOUS
  Filled 2019-06-03 (×10): qty 2

## 2019-06-03 MED FILL — AUSTEDO 6 MG TABLET: 30 days supply | Qty: 60 | Fill #1 | Status: AC

## 2019-06-03 MED FILL — AUSTEDO 6 MG TABLET: ORAL | 30 days supply | Qty: 60 | Fill #1

## 2019-06-03 NOTE — Progress Notes (Signed)
A little blood was noted on his bed sheets. When checking patient for bleeding, the patient stated "it may be coming from my rectum d/t having diarrhea". This RN asked how long has it been happening and the patient stated "the whole time I've been here"

## 2019-06-03 NOTE — Progress Notes (Signed)
Inpatient Diabetes Program Recommendations  AACE/ADA: New Consensus Statement on Inpatient Glycemic Control (2015)  Target Ranges:  Prepandial:   less than 140 mg/dL      Peak postprandial:   less than 180 mg/dL (1-2 hours)      Critically ill patients:  140 - 180 mg/dL   Lab Results  Component Value Date   GLUCAP 59 (L) 06/03/2019   HGBA1C 8.5 (H) 05/31/2019    Review of Glycemic Control Results for Nathan Tyler, Nathan Tyler (MRN [id number]) as of 06/03/2019 09:24  Ref. Range 06/02/2019 11:39 06/02/2019 16:14 06/02/2019 20:38 06/03/2019 07:27  Glucose-Capillary Latest Ref Range: 70 - 99 mg/dL 118 (H) 268 (H) 339 (H) 59 (L)   Diabetes history: Type 2 DM Outpatient Diabetes medications: Tresiba 20 units QD, Trulicity 1.5 Qwk Current orders for Inpatient glycemic control: Lantus 35 units QD, Novolog 5 units TID, Novolog 0-15 units TID, Novolog 0-5 units QHS, Tradjenta 5 mg QD Decadron 6 mg QD  Inpatient Diabetes Program Recommendations:    Noted hypoglycemia this AM of 59 mg/dL.  Although unlikely, Question if Lantus (total of 50 units) on 2/8 lingering given renal component with addition of meal coverage. In an effort to prevent hypoglycemia, could consider switching to Levemir BID dosing.    Additionally, patient is consuming meal around 2000 and is receiving meal coverage plus nighttime correction. Therefore, if patient continues to eat late dinner, could consider discontinuing QHS scale.   Thanks, Bronson Curb, MSN, RNC-OB Diabetes Coordinator [phone] (8a-5p)

## 2019-06-03 NOTE — Progress Notes (Signed)
Pharmacy Antibiotic Note  Nathan Tyler is a 64 y.o. male admitted on 05/31/2019 with COVID-19  pneumonia.  Patient has a h/o renal transplant on TAC/MMF (holding MMF this admission to resume at d/c per renal). Pharmacy has been consulted for cefepime dosing for HCAP.   Plan: -Cefepime 2 g iv q 24 h.  -F/U renal function, sputum culture, plans for antibiotics   Height: 5\' 11"  (180.3 cm) Weight: 201 lb 1 oz (91.2 kg) IBW/kg (Calculated) : 75.3  Temp (24hrs), Avg:98.6 F (37 C), Min:97.9 F (36.6 C), Max:99.6 F (37.6 C)  Recent Labs  Lab 05/31/19 1300 06/01/19 0338 06/02/19 0428 06/03/19 0601  WBC 12.2* 9.6 14.5* 14.3*  CREATININE 1.95* 1.96* 1.92* 1.80*    Estimated Creatinine Clearance: 47.3 mL/min (A) (by C-G formula based on SCr of 1.8 mg/dL (H)).    No Known Allergies  Antimicrobials this admission: 2/10 cefepime >>   Dose adjustments this admission:   Microbiology results: Sputum:    Thank you for allowing pharmacy to be a part of this patient's care.  Ulice Dash D 06/03/2019 12:06 PM

## 2019-06-03 NOTE — Progress Notes (Signed)
PROGRESS NOTE  Nathan Tyler ZOX:[id number] DOB: 09-12-1955 DOA: 05/31/2019 PCP: Celene Squibb, MD  HPI/Recap of past 49 hours: 64 year old male with past medical history of chorea, chronic kidney disease status post renal transplant in 2010 on immunosuppressive medication, chronic hepatitis C, diabetes mellitus and hypertension who tested positive for Covid by his PCP on 2/5 and then was directly admitted on 2/7 when he was noted to be hypoxic.  Following admission, patient was started on remdesivir and IV steroids.  Initially patient improved with being able to be weaned to room air CRP was trending downward and he was noting much improvement, but then on morning of 2/10, CRP increased from 3.3 up to 5.2 and patient needing increased oxygen supplementation.  Initially he was placed on a nonrebreather and then weaned down to 6 L nasal cannula.  Patient states his breathing feels much better than earlier this morning and is not sure why his breathing got worse.  At this time, he was also noted to be hypoglycemic with CBGs in the 54s.  He denies any other complaints.  Assessment/Plan: Principal Problem:   Acute respiratory failure with hypoxia ([place]) from COVID-19 infection: Continue remdesivir and steroids.  His procalcitonin level was normal, however this result is likely unreliable given that he is on immunosuppressive medications.  CT scan of chest unremarkable for PE and so I question if he had worsening cobacterial infection.  Have started broad-spectrum antibiotics.  BNP is normal and he has no previous history of heart failure.  We will follow daily weights.  Work on weaning down oxygen. Active Problems:   HCV infection: Chronic.    Essential hypertension: Blood pressure stable.,  Borderline low on admission    CKD (chronic kidney disease) and patient with history of renal transplant on immunosuppressive medications: Renal function slowly improving.  Continue his other medications.    HLD  (hyperlipidemia): Continue statin.    H/O insulin dependent diabetes mellitus: CBGs with hypoglycemia today.  Turned down insulin.  Poorly controlled at home with A1c at 8.5.    Chorea: Noted, chronic.  At baseline.Recently changed from tetrabenazine to Austedo do toinsurance issues per recent neurology note.    Overweight (BMI 25.0-29.9): Meets criteria with BMI greater than 25    Hypothyroidism: Continue Synthroid.  Leukocytosis: Secondary to steroids  Code Status: Full code  Family Communication: Updated daughter by phone.  Disposition Plan: Home in the next few days once able to wean off of oxygen   Consultants:  None  Procedures:  None  Antimicrobials:  IV cefepime 2/10-present  IV Remdisivir 2/7-2/11  DVT prophylaxis: Lovenox   Objective: Vitals:   06/03/19 1203 06/03/19 1626  BP: 128/89 115/74  Pulse: 79 85  Resp: (!) 23 18  Temp: 97.9 F (36.6 C) 97.7 F (36.5 C)  SpO2: 92% 95%    Intake/Output Summary (Last 24 hours) at 06/03/2019 1629 Last data filed at 06/03/2019 8119 Gross per 24 hour  Intake 600 ml  Output --  Net 600 ml   Filed Weights   05/31/19 1131  Weight: 91.2 kg   Body mass index is 28.04 kg/m.  Exam:   General: Alert and oriented x3, no acute distress  HEENT: Normocephalic and atraumatic, mucous brains are moist  Neck: Supple, no JVD  Cardiovascular: Regular rate and rhythm, S1-S2  Respiratory: Clear to auscultation bilaterally  Abdomen: Soft, nontender, nondistended, positive bowel sounds  Musculoskeletal: No clubbing or cyanosis or edema  Skin: No skin breaks, tears or lesions  Psychiatry: Appropriate, no evidence of psychoses   Data Reviewed: CBC: Recent Labs  Lab 05/31/19 1300 06/01/19 0338 06/02/19 0428 06/03/19 0601  WBC 12.2* 9.6 14.5* 14.3*  NEUTROABS 10.9* 7.5 11.1* 11.4*  HGB 14.6 13.1 14.0 12.8*  HCT 46.0 40.3 41.4 38.3*  MCV 92.7 91.4 89.2 88.7  PLT 406* 375 427* 016   Basic Metabolic  Panel: Recent Labs  Lab 05/31/19 1300 06/01/19 0338 06/01/19 1350 06/02/19 0428 06/03/19 0601  NA 137 133*  --  138 137  K 5.8* 6.5* 5.8* 5.3* 5.3*  CL 109 108  --  112* 114*  CO2 17* 16*  --  17* 16*  GLUCOSE 293* 315*  --  64* 58*  BUN 72* 79*  --  81* 79*  CREATININE 1.95* 1.96*  --  1.92* 1.80*  CALCIUM 8.6* 8.4*  --  8.8* 8.5*  MG  --  2.6*  --  2.7* 2.3   GFR: Estimated Creatinine Clearance: 47.3 mL/min (A) (by C-G formula based on SCr of 1.8 mg/dL (H)). Liver Function Tests: Recent Labs  Lab 05/31/19 1300 06/01/19 0338 06/02/19 0428 06/03/19 0601  AST 23 22 25 24   ALT 16 18 19 17   ALKPHOS 72 67 71 71  BILITOT 0.4 0.5 0.7 0.5  PROT 6.4* 5.5* 5.6* 5.5*  ALBUMIN 3.2* 2.7* 2.7* 2.6*   No results for input(s): LIPASE, AMYLASE in the last 168 hours. No results for input(s): AMMONIA in the last 168 hours. Coagulation Profile: No results for input(s): INR, PROTIME in the last 168 hours. Cardiac Enzymes: No results for input(s): CKTOTAL, CKMB, CKMBINDEX, TROPONINI in the last 168 hours. BNP (last 3 results) No results for input(s): PROBNP in the last 8760 hours. HbA1C: No results for input(s): HGBA1C in the last 72 hours. CBG: Recent Labs  Lab 06/02/19 1614 06/02/19 2038 06/03/19 0727 06/03/19 0823 06/03/19 1205  GLUCAP 268* 339* 59* 90 50*   Lipid Profile: No results for input(s): CHOL, HDL, LDLCALC, TRIG, CHOLHDL, LDLDIRECT in the last 72 hours. Thyroid Function Tests: Recent Labs    06/01/19 0338  FREET4 1.36*   Anemia Panel: Recent Labs    06/02/19 0428 06/03/19 0601  FERRITIN 454* 397*   Urine analysis: No results found for: COLORURINE, APPEARANCEUR, LABSPEC, PHURINE, GLUCOSEU, HGBUR, BILIRUBINUR, KETONESUR, PROTEINUR, UROBILINOGEN, NITRITE, LEUKOCYTESUR Sepsis Labs: @LABRCNTIP (procalcitonin:4,lacticidven:4)  )No results found for this or any previous visit (from the past 240 hour(s)).    Studies: CT ANGIO CHEST PE W OR WO  CONTRAST  Result Date: 06/03/2019 CLINICAL DATA:  Respiratory failure. EXAM: CT ANGIOGRAPHY CHEST WITH CONTRAST TECHNIQUE: Multidetector CT imaging of the chest was performed using the standard protocol during bolus administration of intravenous contrast. Multiplanar CT image reconstructions and MIPs were obtained to evaluate the vascular anatomy. CONTRAST:  36mL OMNIPAQUE IOHEXOL 350 MG/ML SOLN COMPARISON:  None. FINDINGS: Cardiovascular: Satisfactory opacification of the pulmonary arteries to the segmental level. No evidence of pulmonary embolism. Normal heart size. No pericardial effusion. Coronary artery calcifications are noted. Mediastinum/Nodes: No enlarged mediastinal, hilar, or axillary lymph nodes. Thyroid gland, trachea, and esophagus demonstrate no significant findings. Lungs/Pleura: No pneumothorax or pleural effusion is noted. Multiple airspace opacities are noted throughout both lungs consistent with multifocal pneumonia, potentially of viral etiology. Upper Abdomen: No acute abnormality. Musculoskeletal: No chest wall abnormality. No acute or significant osseous findings. Review of the MIP images confirms the above findings. IMPRESSION: 1. No definite evidence of pulmonary embolus. 2. Coronary artery calcifications are noted suggesting coronary artery disease. 3.  Multiple airspace opacities are noted throughout both lungs consistent with multifocal pneumonia, potentially of viral etiology. Electronically Signed   By: Marijo Conception M.D.   On: 06/03/2019 09:26    Scheduled Meds: . vitamin C  500 mg Oral Daily  . atorvastatin  10 mg Oral q1800  . Deutetrabenazine  6 mg Oral BID  . dexamethasone (DECADRON) injection  6 mg Intravenous Q24H  . enoxaparin (LOVENOX) injection  40 mg Subcutaneous Q24H  . insulin aspart  0-15 Units Subcutaneous TID WC  . insulin aspart  0-5 Units Subcutaneous QHS  . insulin aspart  5 Units Subcutaneous TID WC  . insulin glargine  35 Units Subcutaneous Daily  .  Ipratropium-Albuterol  1 puff Inhalation Q6H  . levothyroxine  100 mcg Oral QAC breakfast  . linagliptin  5 mg Oral Daily  . tacrolimus  1.5 mg Oral BID  . zinc sulfate  220 mg Oral Daily    Continuous Infusions: . ceFEPime (MAXIPIME) IV 2 g (06/03/19 1336)  . remdesivir 100 mg in NS 100 mL 100 mg (06/03/19 1030)     LOS: 3 days     Annita Brod, MD [facility]   06/03/2019, 4:29 PM

## 2019-06-03 NOTE — Plan of Care (Signed)
Pt now on 3 LNC due to low sats of 83 at 0400 VS check. Now at 91-92%.    Problem: Education: Goal: Knowledge of risk factors and measures for prevention of condition will improve Outcome: Progressing   Problem: Coping: Goal: Psychosocial and spiritual needs will be supported Outcome: Progressing   Problem: Respiratory: Goal: Will maintain a patent airway Outcome: Progressing Goal: Complications related to the disease process, condition or treatment will be avoided or minimized Outcome: Progressing   Problem: Education: Goal: Knowledge of General Education information will improve Description: Including pain rating scale, medication(s)/side effects and non-pharmacologic comfort measures Outcome: Progressing   Problem: Health Behavior/Discharge Planning: Goal: Ability to manage health-related needs will improve Outcome: Progressing   Problem: Clinical Measurements: Goal: Ability to maintain clinical measurements within normal limits will improve Outcome: Progressing Goal: Will remain free from infection Outcome: Progressing Goal: Diagnostic test results will improve Outcome: Progressing Goal: Respiratory complications will improve Outcome: Progressing Goal: Cardiovascular complication will be avoided Outcome: Progressing   Problem: Activity: Goal: Risk for activity intolerance will decrease Outcome: Progressing   Problem: Nutrition: Goal: Adequate nutrition will be maintained Outcome: Progressing   Problem: Coping: Goal: Level of anxiety will decrease Outcome: Progressing   Problem: Elimination: Goal: Will not experience complications related to bowel motility Outcome: Progressing Goal: Will not experience complications related to urinary retention Outcome: Progressing   Problem: Pain Managment: Goal: General experience of comfort will improve Outcome: Progressing   Problem: Safety: Goal: Ability to remain free from injury will improve Outcome:  Progressing   Problem: Skin Integrity: Goal: Risk for impaired skin integrity will decrease Outcome: Progressing

## 2019-06-03 NOTE — Progress Notes (Signed)
Physical Therapy Treatment Patient Details Name: Nathan Tyler MRN: [id number] DOB: October 17, 1955 Today's Date: 06/03/2019    History of Present Illness Pt is a 64 y.o. male directly admitted from [facility] on 05/31/19 with progressive SOB, found to be hypoxic; pt tested (+) COVID-19 on 2/5. PMH includes neuromuscular disorder, chorea, DM, CKD (s/p renal transplant 2010), R foot 5th and L 2nd toe amputations.    PT Comments    The patient  Received on 6 L(finger sensor) 96%. Ambulated x 30' using RW for safety with SPO2 91% on 6 L. On RA, dropped to 86%.  Patient reports girlfriend is home with O2, Patient will need to stay on his first level for safety at DC. Declines to consider Rehab.  Did recommend to patient to consider placing a stair glide in his home for safety once recovers from illness as patient reports stairs were a hardship PTA. Continue PT.  Follow Up Recommendations  Home health PT;Supervision/Assistance - 24 hour     Equipment Recommendations  Rolling walker with 5" wheels    Recommendations for Other Services       Precautions / Restrictions Precautions Precautions: Fall Precaution Comments: Chorea; Desats-Please try ear sensor!!!    Mobility  Bed Mobility Overal bed mobility: Modified Independent             General bed mobility comments: extra time and momentum to sit up  Transfers   Equipment used: Rolling walker (2 wheeled) Transfers: Sit to/from Stand Sit to Stand: Min guard            Ambulation/Gait Ambulation/Gait assistance: Min guard Gait Distance (Feet): 20 Feet Assistive device: Rolling walker (2 wheeled) Gait Pattern/deviations: Ataxic;Decreased stride length Gait velocity: decr   General Gait Details: slow turning, not LOB   Stairs             Wheelchair Mobility    Modified Rankin (Stroke Patients Only)       Balance Overall balance assessment: Mild deficits observed, not formally tested                                           Cognition Arousal/Alertness: Awake/alert Behavior During Therapy: WFL for tasks assessed/performed Overall Cognitive Status: Within Functional Limits for tasks assessed                                        Exercises Other Exercises Other Exercises: x10 incentive spirometer Other Exercises: x10 flutter valve    General Comments General comments (skin integrity, edema, etc.): needs to be assessed without RW for home, appears to need  one      Pertinent Vitals/Pain Pain Assessment: No/denies pain    Home Living                      Prior Function            PT Goals (current goals can now be found in the care plan section) Progress towards PT goals: Progressing toward goals    Frequency    Min 3X/week      PT Plan Current plan remains appropriate    Co-evaluation              AM-PAC PT "6 Clicks" Mobility   Outcome Measure  Help needed turning from your back to your side while in a flat bed without using bedrails?: None Help needed moving from lying on your back to sitting on the side of a flat bed without using bedrails?: None Help needed moving to and from a bed to a chair (including a wheelchair)?: A Little Help needed standing up from a chair using your arms (e.g., wheelchair or bedside chair)?: A Little Help needed to walk in hospital room?: A Little Help needed climbing 3-5 steps with a railing? : A Lot 6 Click Score: 19    End of Session Equipment Utilized During Treatment: Gait belt;Oxygen Activity Tolerance: Patient tolerated treatment well Patient left: in bed;with call bell/phone within reach Nurse Communication: Mobility status PT Visit Diagnosis: Unsteadiness on feet (R26.81);Other abnormalities of gait and mobility (R26.89);Muscle weakness (generalized) (M62.81)     Time: 1683-7290 PT Time Calculation (min) (ACUTE ONLY): 43 min  Charges:  $Gait Training: 23-37  mins                     [place]  Office [phone]    Claretha Cooper 06/03/2019, 4:45 PM

## 2019-06-04 DIAGNOSIS — G255 Other chorea: Secondary | ICD-10-CM

## 2019-06-04 LAB — COMPREHENSIVE METABOLIC PANEL
ALT: 17 U/L (ref 0–44)
AST: 21 U/L (ref 15–41)
Albumin: 2.4 g/dL — ABNORMAL LOW (ref 3.5–5.0)
Alkaline Phosphatase: 65 U/L (ref 38–126)
Anion gap: 7 (ref 5–15)
BUN: 73 mg/dL — ABNORMAL HIGH (ref 8–23)
CO2: 16 mmol/L — ABNORMAL LOW (ref 22–32)
Calcium: 8.3 mg/dL — ABNORMAL LOW (ref 8.9–10.3)
Chloride: 113 mmol/L — ABNORMAL HIGH (ref 98–111)
Creatinine, Ser: 1.82 mg/dL — ABNORMAL HIGH (ref 0.61–1.24)
GFR calc Af Amer: 44 mL/min — ABNORMAL LOW (ref >60)
GFR calc non Af Amer: 38 mL/min — ABNORMAL LOW (ref >60)
Glucose, Bld: 177 mg/dL — ABNORMAL HIGH (ref 70–99)
Potassium: 5.8 mmol/L — ABNORMAL HIGH (ref 3.5–5.1)
Sodium: 136 mmol/L (ref 135–145)
Total Bilirubin: 0.5 mg/dL (ref 0.3–1.2)
Total Protein: 5 g/dL — ABNORMAL LOW (ref 6.5–8.1)

## 2019-06-04 LAB — MAGNESIUM: Magnesium: 2.4 mg/dL (ref 1.7–2.4)

## 2019-06-04 LAB — CBC WITH DIFFERENTIAL/PLATELET
Abs Immature Granulocytes: 0.15 10*3/uL — ABNORMAL HIGH (ref 0.00–0.07)
Basophils Absolute: 0 10*3/uL (ref 0.0–0.1)
Basophils Relative: 0 %
Eosinophils Absolute: 0 10*3/uL (ref 0.0–0.5)
Eosinophils Relative: 0 %
HCT: 36.8 % — ABNORMAL LOW (ref 39.0–52.0)
Hemoglobin: 12.1 g/dL — ABNORMAL LOW (ref 13.0–17.0)
Immature Granulocytes: 1 %
Lymphocytes Relative: 11 %
Lymphs Abs: 1.2 10*3/uL (ref 0.7–4.0)
MCH: 30 pg (ref 26.0–34.0)
MCHC: 32.9 g/dL (ref 30.0–36.0)
MCV: 91.1 fL (ref 80.0–100.0)
Monocytes Absolute: 0.8 10*3/uL (ref 0.1–1.0)
Monocytes Relative: 7 %
Neutro Abs: 8.7 10*3/uL — ABNORMAL HIGH (ref 1.7–7.7)
Neutrophils Relative %: 81 %
Platelets: 341 10*3/uL (ref 150–400)
RBC: 4.04 MIL/uL — ABNORMAL LOW (ref 4.22–5.81)
RDW: 13.4 % (ref 11.5–15.5)
WBC: 10.8 10*3/uL — ABNORMAL HIGH (ref 4.0–10.5)
nRBC: 0 % (ref 0.0–0.2)

## 2019-06-04 LAB — GLUCOSE, CAPILLARY
Glucose-Capillary: 98 mg/dL (ref 70–99)
Glucose-Capillary: 114 mg/dL — ABNORMAL HIGH (ref 70–99)
Glucose-Capillary: 163 mg/dL — ABNORMAL HIGH (ref 70–99)
Glucose-Capillary: 55 mg/dL — ABNORMAL LOW (ref 70–99)
Glucose-Capillary: 70 mg/dL (ref 70–99)

## 2019-06-04 LAB — D-DIMER, QUANTITATIVE: D-Dimer, Quant: 2.38 ug/mL-FEU — ABNORMAL HIGH (ref 0.00–0.50)

## 2019-06-04 LAB — FERRITIN: Ferritin: 488 ng/mL — ABNORMAL HIGH (ref 24–336)

## 2019-06-04 LAB — C-REACTIVE PROTEIN: CRP: 9.2 mg/dL — ABNORMAL HIGH (ref <1.0)

## 2019-06-04 MED ORDER — SODIUM ZIRCONIUM CYCLOSILICATE 5 G PO PACK
5.0000 g | PACK | Freq: Once | ORAL | Status: AC
  Administered 2019-06-04: 5 g via ORAL
  Filled 2019-06-04: qty 1

## 2019-06-04 MED ORDER — INSULIN GLARGINE 100 UNIT/ML ~~LOC~~ SOLN
30.0000 [IU] | Freq: Every day | SUBCUTANEOUS | Status: DC
  Filled 2019-06-04: qty 0.3

## 2019-06-04 NOTE — Plan of Care (Signed)

## 2019-06-04 NOTE — Progress Notes (Signed)
Inpatient Diabetes Program Recommendations  AACE/ADA: New Consensus Statement on Inpatient Glycemic Control (2015)  Target Ranges:  Prepandial:   less than 140 mg/dL      Peak postprandial:   less than 180 mg/dL (1-2 hours)      Critically ill patients:  140 - 180 mg/dL   Lab Results  Component Value Date   GLUCAP 98 06/04/2019   HGBA1C 8.5 (H) 05/31/2019    Review of Glycemic Control Results for Nathan Tyler, Nathan Tyler (MRN [id number]) as of 06/04/2019 09:32  Ref. Range 06/03/2019 12:05 06/03/2019 16:21 06/03/2019 20:48 06/04/2019 07:39  Glucose-Capillary Latest Ref Range: 70 - 99 mg/dL 50 (L) 234 (H) 211 (H) 98   Diabetes history: Type 2 DM Outpatient Diabetes medications: Tresiba 20 units QD, Trulicity 1.5 Qwk Current orders for Inpatient glycemic control: Lantus 35 units QD, Novolog 5 units TID, Novolog 0-15 units TID, Novolog 0-5 units QHS, Tradjenta 5 mg QD Decadron 6 mg QD  Inpatient Diabetes Program Recommendations:    Consider reducing Lantus to 30 units QD.   Thanks, Bronson Curb, MSN, RNC-OB Diabetes Coordinator [phone] (8a-5p)

## 2019-06-04 NOTE — Progress Notes (Signed)
PROGRESS NOTE  Nathan Tyler HAL:[id number] DOB: 1956-03-10 DOA: 05/31/2019 PCP: Celene Squibb, MD  HPI/Recap of past 29 hours: 64 year old male with past medical history of chorea, chronic kidney disease status post renal transplant in 2010 on immunosuppressive medication, chronic hepatitis C, diabetes mellitus and hypertension who tested positive for Covid by his PCP on 2/5 and then was directly admitted on 2/7 when he was noted to be hypoxic.  Following admission, patient was started on remdesivir and IV steroids.  Initially patient improved with being able to be weaned to room air CRP was trending downward and he was noting much improvement, but then on morning of 2/10, CRP increased from 3.3 up to 5.2 and patient needing increased oxygen supplementation.  Initially he was placed on a nonrebreather and then weaned down to 6 L nasal cannula.  At that time, he was also noted to be hypoglycemic.  Started on broad-spectrum antibiotics.  Today, patient feels a little better than the previous day.  Still somewhat short of breath, no cough.  No pain.  Assessment/Plan: Principal Problem:   Acute respiratory failure with hypoxia ([place]) from COVID-19 infection: Continue remdesivir and steroids.  His procalcitonin level was normal, however this is likely unreliable given that he is on immunosuppressive medications.  CT scan of chest unremarkable for PE and so I question if he had worsening cobacterial infection.  Have started broad-spectrum antibiotics.  BNP is normal and he has no previous history of heart failure.  We will follow daily weights.  Work on weaning down oxygen. Active Problems:   HCV infection: Chronic.    Essential hypertension: Blood pressure stable.,  Borderline low on admission initially  Stage IIIb CKD (chronic kidney disease) and patient with history of renal transplant on immunosuppressive medications: Renal function slowly improving, much closer not to baseline.  Continue his other  medications.    HLD (hyperlipidemia): Continue statin.    H/O insulin dependent diabetes mellitus: CBGs with hypoglycemia today.  Turned down insulin.  Poorly controlled at home with A1c at 8.5.    Chorea: Noted, chronic.  At baseline.Recently changed from tetrabenazine to [place] do toinsurance issues per recent neurology note.    Overweight (BMI 25.0-29.9): Meets criteria with BMI greater than 25    Hypothyroidism: Continue Synthroid.  Leukocytosis: Secondary to steroids  Code Status: Full code  Family Communication: Updated daughter by phone.  Disposition Plan: Home hopefully this weekend once able to wean off of oxygen   Consultants:  None  Procedures:  None  Antimicrobials:  IV cefepime 2/10-present  IV Remdisivir 2/7-2/11  DVT prophylaxis: Lovenox   Objective: Vitals:   06/04/19 0000 06/04/19 0400  BP: 103/72 116/72  Pulse: 77 64  Resp: (!) 25 16  Temp:  98.5 F (36.9 C)  SpO2: 95% 96%    Intake/Output Summary (Last 24 hours) at 06/04/2019 1138 Last data filed at 06/03/2019 1901 Gross per 24 hour  Intake 480 ml  Output --  Net 480 ml   Filed Weights   05/31/19 1131  Weight: 91.2 kg   Body mass index is 28.04 kg/m.  Exam:   General: Alert and oriented x3, no acute distress  HEENT: Normocephalic and atraumatic, mucous brains are moist  Neck: Supple, no JVD  Cardiovascular: Regular rate and rhythm, S1-S2  Respiratory: Clear to auscultation bilaterally  Abdomen: Soft, nontender, nondistended, positive bowel sounds  Musculoskeletal: No clubbing or cyanosis or edema  Skin: No skin breaks, tears or lesions  Psychiatry: Appropriate, no evidence  of psychoses   Data Reviewed: CBC: Recent Labs  Lab 05/31/19 1300 06/01/19 0338 06/02/19 0428 06/03/19 0601 06/04/19 0300  WBC 12.2* 9.6 14.5* 14.3* 10.8*  NEUTROABS 10.9* 7.5 11.1* 11.4* 8.7*  HGB 14.6 13.1 14.0 12.8* 12.1*  HCT 46.0 40.3 41.4 38.3* 36.8*  MCV 92.7 91.4 89.2 88.7  91.1  PLT 406* 375 427* 399 623   Basic Metabolic Panel: Recent Labs  Lab 05/31/19 1300 05/31/19 1300 06/01/19 0338 06/01/19 1350 06/02/19 0428 06/03/19 0601 06/04/19 0300  NA 137  --  133*  --  138 137 136  K 5.8*   < > 6.5* 5.8* 5.3* 5.3* 5.8*  CL 109  --  108  --  112* 114* 113*  CO2 17*  --  16*  --  17* 16* 16*  GLUCOSE 293*  --  315*  --  64* 58* 177*  BUN 72*  --  79*  --  81* 79* 73*  CREATININE 1.95*  --  1.96*  --  1.92* 1.80* 1.82*  CALCIUM 8.6*  --  8.4*  --  8.8* 8.5* 8.3*  MG  --   --  2.6*  --  2.7* 2.3 2.4   < > = values in this interval not displayed.   GFR: Estimated Creatinine Clearance: 46.8 mL/min (A) (by C-G formula based on SCr of 1.82 mg/dL (H)). Liver Function Tests: Recent Labs  Lab 05/31/19 1300 06/01/19 0338 06/02/19 0428 06/03/19 0601 06/04/19 0300  AST 23 22 25 24 21   ALT 16 18 19 17 17   ALKPHOS 72 67 71 71 65  BILITOT 0.4 0.5 0.7 0.5 0.5  PROT 6.4* 5.5* 5.6* 5.5* 5.0*  ALBUMIN 3.2* 2.7* 2.7* 2.6* 2.4*   No results for input(s): LIPASE, AMYLASE in the last 168 hours. No results for input(s): AMMONIA in the last 168 hours. Coagulation Profile: No results for input(s): INR, PROTIME in the last 168 hours. Cardiac Enzymes: No results for input(s): CKTOTAL, CKMB, CKMBINDEX, TROPONINI in the last 168 hours. BNP (last 3 results) No results for input(s): PROBNP in the last 8760 hours. HbA1C: No results for input(s): HGBA1C in the last 72 hours. CBG: Recent Labs  Lab 06/03/19 0823 06/03/19 1205 06/03/19 1621 06/03/19 2048 06/04/19 0739  GLUCAP 90 50* 234* 211* 98   Lipid Profile: No results for input(s): CHOL, HDL, LDLCALC, TRIG, CHOLHDL, LDLDIRECT in the last 72 hours. Thyroid Function Tests: No results for input(s): TSH, T4TOTAL, FREET4, T3FREE, THYROIDAB in the last 72 hours. Anemia Panel: Recent Labs    06/03/19 0601 06/04/19 0300  FERRITIN 397* 488*   Urine analysis: No results found for: COLORURINE, APPEARANCEUR,  LABSPEC, PHURINE, GLUCOSEU, HGBUR, BILIRUBINUR, KETONESUR, PROTEINUR, UROBILINOGEN, NITRITE, LEUKOCYTESUR Sepsis Labs: @LABRCNTIP (procalcitonin:4,lacticidven:4)  )No results found for this or any previous visit (from the past 240 hour(s)).    Studies: No results found.  Scheduled Meds: . vitamin C  500 mg Oral Daily  . atorvastatin  10 mg Oral q1800  . Deutetrabenazine  6 mg Oral BID  . dexamethasone (DECADRON) injection  6 mg Intravenous Q24H  . enoxaparin (LOVENOX) injection  40 mg Subcutaneous Q24H  . insulin aspart  0-15 Units Subcutaneous TID WC  . insulin aspart  0-5 Units Subcutaneous QHS  . insulin aspart  5 Units Subcutaneous TID WC  . [START ON 06/05/2019] insulin glargine  30 Units Subcutaneous Daily  . Ipratropium-Albuterol  1 puff Inhalation Q6H  . levothyroxine  100 mcg Oral QAC breakfast  . linagliptin  5 mg  Oral Daily  . tacrolimus  1.5 mg Oral BID  . zinc sulfate  220 mg Oral Daily    Continuous Infusions: . ceFEPime (MAXIPIME) IV 2 g (06/04/19 0908)     LOS: 4 days     Annita Brod, MD [facility]   06/04/2019, 11:38 AM

## 2019-06-04 NOTE — Progress Notes (Signed)
Physical Therapy Treatment Patient Details Name: Nathan Tyler MRN: [id number] DOB: Aug 04, 1955 Today's Date: 06/04/2019    History of Present Illness Pt is a 64 y.o. male directly admitted from [facility] infusion clinic on 05/31/19 with progressive SOB, found to be hypoxic; pt tested (+) COVID-19 on 2/5. PMH includes neuromuscular disorder, chorea, DM, CKD (s/p renal transplant 2010), R foot 5th and L 2nd toe amputations.    PT Comments    The patient was assisted to ambulate in room x 50' with Rw and min guard on 4 L [place], tried monitor on finger and ear with difficulty registering, appears to drop mid 80's, definitely SOB with 30's RR. Patient ambulated x 40' without RW with 1 near balance loss. Patient remains DOE, requires O2 at this time. Concern for balance and O2 tubing in the home.recommend HHPT.  Follow Up Recommendations  Home health PT;Supervision/Assistance - 24 hour     Equipment Recommendations  Rolling walker with 5" wheels    Recommendations for Other Services       Precautions / Restrictions Precautions Precautions: Fall Precaution Comments: Chorea; Desats-try ear sensor!!! Difficult to pick up saturation.    Mobility  Bed Mobility Overal bed mobility: Modified Independent             General bed mobility comments: extra time and momentum to sit up  Transfers Overall transfer level: Needs assistance Equipment used: Rolling walker (2 wheeled) Transfers: Sit to/from Stand Sit to Stand: Min guard         General transfer comment: RW tends to lift up  Ambulation/Gait Ambulation/Gait assistance: Min guard;Min assist Gait Distance (Feet): 50 Feet(then 40') Assistive device: Rolling walker (2 wheeled);None Gait Pattern/deviations: Ataxic;Decreased stride length Gait velocity: decr   General Gait Details: with RW decreased corea movements, without RW, , had 1 LOB, required steady assist.   Stairs             Wheelchair Mobility    Modified Rankin  (Stroke Patients Only)       Balance Overall balance assessment: Needs assistance Sitting-balance support: No upper extremity supported;Feet supported Sitting balance-Leahy Scale: Good Sitting balance - Comments: does move around a bit due to chorea   Standing balance support: No upper extremity supported;During functional activity Standing balance-Leahy Scale: Poor Standing balance comment: does need UE support with dynamic intermittently                            Cognition Arousal/Alertness: Awake/alert Behavior During Therapy: WFL for tasks assessed/performed Overall Cognitive Status: Within Functional Limits for tasks assessed                                        Exercises      General Comments        Pertinent Vitals/Pain Pain Assessment: No/denies pain    Home Living                      Prior Function            PT Goals (current goals can now be found in the care plan section) Progress towards PT goals: Progressing toward goals    Frequency    Min 3X/week      PT Plan Current plan remains appropriate    Co-evaluation  AM-PAC PT "6 Clicks" Mobility   Outcome Measure  Help needed turning from your back to your side while in a flat bed without using bedrails?: None Help needed moving from lying on your back to sitting on the side of a flat bed without using bedrails?: None Help needed moving to and from a bed to a chair (including a wheelchair)?: A Little Help needed standing up from a chair using your arms (e.g., wheelchair or bedside chair)?: A Little Help needed to walk in hospital room?: A Lot Help needed climbing 3-5 steps with a railing? : A Lot 6 Click Score: 18    End of Session Equipment Utilized During Treatment: Gait belt;Oxygen Activity Tolerance: Patient tolerated treatment well Patient left: in chair;with call bell/phone within reach;with chair alarm set Nurse Communication:  Mobility status PT Visit Diagnosis: Unsteadiness on feet (R26.81);Other abnormalities of gait and mobility (R26.89);Muscle weakness (generalized) (M62.81)     Time: 1610-9604 PT Time Calculation (min) (ACUTE ONLY): 51 min  Charges:  $Gait Training: 23-37 mins $Self Care/Home Management: [place]  Office [phone]    Claretha Cooper 06/04/2019, 1:40 PM

## 2019-06-05 LAB — CBC WITH DIFFERENTIAL/PLATELET
Abs Immature Granulocytes: 0.24 10*3/uL — ABNORMAL HIGH (ref 0.00–0.07)
Basophils Absolute: 0 10*3/uL (ref 0.0–0.1)
Basophils Relative: 0 %
Eosinophils Absolute: 0 10*3/uL (ref 0.0–0.5)
Eosinophils Relative: 0 %
HCT: 36.2 % — ABNORMAL LOW (ref 39.0–52.0)
Hemoglobin: 12.1 g/dL — ABNORMAL LOW (ref 13.0–17.0)
Immature Granulocytes: 1 %
Lymphocytes Relative: 12 %
Lymphs Abs: 2.1 10*3/uL (ref 0.7–4.0)
MCH: 29.8 pg (ref 26.0–34.0)
MCHC: 33.4 g/dL (ref 30.0–36.0)
MCV: 89.2 fL (ref 80.0–100.0)
Monocytes Absolute: 1.5 10*3/uL — ABNORMAL HIGH (ref 0.1–1.0)
Monocytes Relative: 9 %
Neutro Abs: 13.6 10*3/uL — ABNORMAL HIGH (ref 1.7–7.7)
Neutrophils Relative %: 78 %
Platelets: 354 10*3/uL (ref 150–400)
RBC: 4.06 MIL/uL — ABNORMAL LOW (ref 4.22–5.81)
RDW: 13.4 % (ref 11.5–15.5)
WBC: 17.5 10*3/uL — ABNORMAL HIGH (ref 4.0–10.5)
nRBC: 0 % (ref 0.0–0.2)

## 2019-06-05 LAB — GLUCOSE, CAPILLARY
Glucose-Capillary: 45 mg/dL — ABNORMAL LOW (ref 70–99)
Glucose-Capillary: 41 mg/dL — CL (ref 70–99)
Glucose-Capillary: 153 mg/dL — ABNORMAL HIGH (ref 70–99)
Glucose-Capillary: 36 mg/dL — CL (ref 70–99)
Glucose-Capillary: 104 mg/dL — ABNORMAL HIGH (ref 70–99)
Glucose-Capillary: 121 mg/dL — ABNORMAL HIGH (ref 70–99)
Glucose-Capillary: 276 mg/dL — ABNORMAL HIGH (ref 70–99)
Glucose-Capillary: 318 mg/dL — ABNORMAL HIGH (ref 70–99)

## 2019-06-05 LAB — COMPREHENSIVE METABOLIC PANEL
ALT: 20 U/L (ref 0–44)
AST: 23 U/L (ref 15–41)
Albumin: 2.2 g/dL — ABNORMAL LOW (ref 3.5–5.0)
Alkaline Phosphatase: 71 U/L (ref 38–126)
Anion gap: 7 (ref 5–15)
BUN: 62 mg/dL — ABNORMAL HIGH (ref 8–23)
CO2: 16 mmol/L — ABNORMAL LOW (ref 22–32)
Calcium: 8.4 mg/dL — ABNORMAL LOW (ref 8.9–10.3)
Chloride: 114 mmol/L — ABNORMAL HIGH (ref 98–111)
Creatinine, Ser: 1.7 mg/dL — ABNORMAL HIGH (ref 0.61–1.24)
GFR calc Af Amer: 48 mL/min — ABNORMAL LOW (ref >60)
GFR calc non Af Amer: 41 mL/min — ABNORMAL LOW (ref >60)
Glucose, Bld: 44 mg/dL — CL (ref 70–99)
Potassium: 5.5 mmol/L — ABNORMAL HIGH (ref 3.5–5.1)
Sodium: 137 mmol/L (ref 135–145)
Total Bilirubin: 0.7 mg/dL (ref 0.3–1.2)
Total Protein: 5 g/dL — ABNORMAL LOW (ref 6.5–8.1)

## 2019-06-05 LAB — C-REACTIVE PROTEIN
CRP: 12.5 mg/dL — ABNORMAL HIGH (ref <1.0)
CRP: 16.4 mg/dL — ABNORMAL HIGH (ref <1.0)

## 2019-06-05 LAB — D-DIMER, QUANTITATIVE: D-Dimer, Quant: 2.84 ug/mL-FEU — ABNORMAL HIGH (ref 0.00–0.50)

## 2019-06-05 LAB — FERRITIN: Ferritin: 431 ng/mL — ABNORMAL HIGH (ref 24–336)

## 2019-06-05 LAB — MAGNESIUM: Magnesium: 2.3 mg/dL (ref 1.7–2.4)

## 2019-06-05 MED ORDER — DEXTROSE 50 % IV SOLN
INTRAVENOUS | Status: AC
  Filled 2019-06-05: qty 50

## 2019-06-05 MED ORDER — INSULIN ASPART 100 UNIT/ML ~~LOC~~ SOLN
0.0000 [IU] | Freq: Three times a day (TID) | SUBCUTANEOUS | Status: DC
  Administered 2019-06-05: 5 [IU] via SUBCUTANEOUS
  Administered 2019-06-06: 18:00:00 0 [IU] via SUBCUTANEOUS
  Administered 2019-06-06: 15:00:00 1 [IU] via SUBCUTANEOUS
  Administered 2019-06-07: 5 [IU] via SUBCUTANEOUS
  Administered 2019-06-07: 12:00:00 2 [IU] via SUBCUTANEOUS
  Administered 2019-06-07 – 2019-06-08 (×2): 1 [IU] via SUBCUTANEOUS
  Administered 2019-06-08: 5 [IU] via SUBCUTANEOUS
  Administered 2019-06-08 – 2019-06-09 (×3): 3 [IU] via SUBCUTANEOUS
  Administered 2019-06-09 – 2019-06-11 (×4): 2 [IU] via SUBCUTANEOUS
  Administered 2019-06-12: 09:00:00 3 [IU] via SUBCUTANEOUS
  Administered 2019-06-12: 13:00:00 2 [IU] via SUBCUTANEOUS
  Administered 2019-06-12: 7 [IU] via SUBCUTANEOUS
  Administered 2019-06-13: 2 [IU] via SUBCUTANEOUS
  Administered 2019-06-13: 11:00:00 1 [IU] via SUBCUTANEOUS
  Administered 2019-06-13: 17:00:00 3 [IU] via SUBCUTANEOUS

## 2019-06-05 MED ORDER — INSULIN ASPART 100 UNIT/ML ~~LOC~~ SOLN: 3.0000 [IU] | Freq: Three times a day (TID) | SUBCUTANEOUS | Status: DC

## 2019-06-05 MED ORDER — INSULIN ASPART 100 UNIT/ML ~~LOC~~ SOLN
0.0000 [IU] | Freq: Every day | SUBCUTANEOUS | Status: DC
  Administered 2019-06-05: 22:00:00 3 [IU] via SUBCUTANEOUS
  Administered 2019-06-06 – 2019-06-07 (×2): 2 [IU] via SUBCUTANEOUS
  Administered 2019-06-08: 21:00:00 3 [IU] via SUBCUTANEOUS
  Administered 2019-06-09 – 2019-06-14 (×4): 2 [IU] via SUBCUTANEOUS

## 2019-06-05 MED ORDER — INSULIN GLARGINE 100 UNIT/ML ~~LOC~~ SOLN
20.0000 [IU] | Freq: Every day | SUBCUTANEOUS | Status: DC
  Administered 2019-06-05 – 2019-06-06 (×2): 20 [IU] via SUBCUTANEOUS
  Filled 2019-06-05 (×2): qty 0.2

## 2019-06-05 MED ORDER — DEXTROSE 50 % IV SOLN
25.0000 g | INTRAVENOUS | Status: AC
  Administered 2019-06-05: 05:00:00 25 g via INTRAVENOUS

## 2019-06-05 NOTE — Progress Notes (Signed)
Occupational Therapy Treatment Patient Details Name: Nathan Tyler MRN: [id number] DOB: 06-13-1955 Today's Date: 06/05/2019    History of present illness Pt is a 64 y.o. male directly admitted from [facility] clinic on 05/31/19 with progressive SOB, found to be hypoxic; pt tested (+) COVID-19 on 2/5. PMH includes neuromuscular disorder, chorea, DM, CKD (s/p renal transplant 2010), R foot 5th and L 2nd toe amputations.   OT comments  Patient in bed on arrival and ready for therapy.  Patient is progressing since last visit.  Able to complete bed mobility mod independence.  He took transfers more slowly today as a precaution, indicating safety awareness has also increased.  We discussed benefits of completing ADLs sitting vs standing when possible, as his chorea is significant.  Patient on 5L O2 with SpO2 92 at rest and desat to 80 with ambulating 20 ft and stand to gather ADL supplies.  Completed grooming while seated.  Practiced fluuter valve and incentive spirometer (improving- pulled 1000).  Will continue to follow with OT acutely to address the deficits listed below to increase independence with ADLs.    Follow Up Recommendations  Home health OT    Equipment Recommendations  3 in 1 bedside commode    Recommendations for Other Services      Precautions / Restrictions Precautions Precautions: Fall Precaution Comments: chorea, desats Restrictions Weight Bearing Restrictions: No       Mobility Bed Mobility Overal bed mobility: Modified Independent             General bed mobility comments: extra time and effort, but no external assist from therapist, relies on railing for leverage  Transfers Overall transfer level: Needs assistance Equipment used: Rolling walker (2 wheeled) Transfers: Sit to/from Stand Sit to Stand: Min guard         General transfer comment: min guard assist for safety and to stabilize RW during transitions.     Balance Overall balance assessment:  Needs assistance Sitting-balance support: No upper extremity supported;Feet supported Sitting balance-Leahy Scale: Good     Standing balance support: Bilateral upper extremity supported;During functional activity Standing balance-Leahy Scale: Poor Standing balance comment: needs external assist in standing.                            ADL either performed or assessed with clinical judgement   ADL Overall ADL's : Needs assistance/impaired Eating/Feeding: Set up;Sitting   Grooming: Wash/dry hands;Wash/dry face;Oral care;Set up;Sitting                               Functional mobility during ADLs: Min guard;Rolling walker General ADL Comments: Discussed completed standing ADLs while seated to promote safety due to chorea     Vision       Perception     Praxis      Cognition Arousal/Alertness: Awake/alert Behavior During Therapy: WFL for tasks assessed/performed Overall Cognitive Status: Within Functional Limits for tasks assessed                                          Exercises Other Exercises Other Exercises: x10 flutter valve Other Exercises: x10 incentive spirometer   Shoulder Instructions       General Comments      Pertinent Vitals/ Pain  Pain Assessment: No/denies pain  Home Living                                          Prior Functioning/Environment              Frequency  Min 3X/week        Progress Toward Goals  OT Goals(current goals can now be found in the care plan section)  Progress towards OT goals: Progressing toward goals  Acute Rehab OT Goals Patient Stated Goal: Just want to get well and get home safely OT Goal Formulation: With patient Time For Goal Achievement: 06/16/19 Potential to Achieve Goals: Good  Plan Discharge plan remains appropriate    Co-evaluation                 AM-PAC OT "6 Clicks" Daily Activity     Outcome Measure   Help from  another person eating meals?: None Help from another person taking care of personal grooming?: A Little Help from another person toileting, which includes using toliet, bedpan, or urinal?: A Little Help from another person bathing (including washing, rinsing, drying)?: A Little Help from another person to put on and taking off regular upper body clothing?: A Little Help from another person to put on and taking off regular lower body clothing?: A Little 6 Click Score: 19    End of Session Equipment Utilized During Treatment: Rolling walker;Oxygen  OT Visit Diagnosis: Unsteadiness on feet (R26.81);Other abnormalities of gait and mobility (R26.89);Muscle weakness (generalized) (M62.81);Ataxia, unspecified (R27.0);Other symptoms and signs involving the nervous system (R29.898)   Activity Tolerance Patient tolerated treatment well   Patient Left in chair;with call bell/phone within reach   Nurse Communication Mobility status        Time: 2446-2863 OT Time Calculation (min): 38 min  Charges: OT General Charges $OT Visit: 1 Visit OT Treatments $Self Care/Home Management : 38-52 mins  Nathan Tyler, OTR/L    Nathan Tyler 06/05/2019, 4:18 PM

## 2019-06-05 NOTE — Progress Notes (Signed)
Physical Therapy Treatment Patient Details Name: Nathan Tyler MRN: [id number] DOB: Jul 08, 1955 Today's Date: 06/05/2019    History of Present Illness Pt is a 64 y.o. male directly admitted from [place] [facility] clinic on 05/31/19 with progressive SOB, found to be hypoxic; pt tested (+) COVID-19 on 2/5. PMH includes neuromuscular disorder, chorea, DM, CKD (s/p renal transplant 2010), R foot 5th and L 2nd toe amputations.    PT Comments    Pt is progressing with his mobility.  He feels subjectively stronger and more stable walking with RW and O2 than yesterday.  His sats still drop on 4 L to the low 70s during gait, increased to 6L and dropped to the low 70s during gait rebounding to the upper 80s in ~5-8 mins with pursed lip breathing.  I was able to wean him back down to 4 L O2 [place] at rest at the end of the session with sats 92%.  Measured by pedi earlobe probe.  PT will continue to follow acutely for safe mobility progression.  Follow Up Recommendations  Home health PT;Supervision/Assistance - 24 hour     Equipment Recommendations  Rolling walker with 5" wheels    Recommendations for Other Services    NA    Precautions / Restrictions Precautions Precautions: Fall Precaution Comments: chorea, desats    Mobility  Bed Mobility Overal bed mobility: Modified Independent             General bed mobility comments: extra time and effort, but no external assist from therapist, relies on railing for leverage  Transfers Overall transfer level: Needs assistance Equipment used: Rolling walker (2 wheeled) Transfers: Sit to/from Stand Sit to Stand: Min guard         General transfer comment: min guard assist for safety and to stabilize RW during transitions.   Ambulation/Gait Ambulation/Gait assistance: Min guard Gait Distance (Feet): 20 Feet(x3) Assistive device: Rolling walker (2 wheeled) Gait Pattern/deviations: Step-through pattern;Ataxic Gait velocity: decreased Gait velocity  interpretation: 1.31 - 2.62 ft/sec, indicative of limited community ambulator General Gait Details: Pt needs min guard assist for balance, pt feels more stable with RW and reports he feels better than when he walked yesterday with PT.  I did have to turn him up to 6L during mobility due to sats unable to recover higher than the mid 80s after walking during seated rest.  I was able to trun him back down to 4 L at the end of the session with O2 sats in the low 90s.        Balance Overall balance assessment: Needs assistance Sitting-balance support: No upper extremity supported;Feet supported Sitting balance-Leahy Scale: Good     Standing balance support: Bilateral upper extremity supported;Single extremity supported Standing balance-Leahy Scale: Poor Standing balance comment: needs external assist in standing.                             Cognition Arousal/Alertness: Awake/alert Behavior During Therapy: WFL for tasks assessed/performed Overall Cognitive Status: Within Functional Limits for tasks assessed                                        Exercises Other Exercises Other Exercises: flutter valve x 10 and incentive spirometer x 10 with 750 mL        Pertinent Vitals/Pain Pain Assessment: No/denies pain  PT Goals (current goals can now be found in the care plan section) Acute Rehab PT Goals Patient Stated Goal: Just want to get well and get home safely Progress towards PT goals: Progressing toward goals    Frequency    Min 3X/week      PT Plan Current plan remains appropriate       AM-PAC PT "6 Clicks" Mobility   Outcome Measure  Help needed turning from your back to your side while in a flat bed without using bedrails?: None Help needed moving from lying on your back to sitting on the side of a flat bed without using bedrails?: A Little Help needed moving to and from a bed to a chair (including a wheelchair)?: None Help  needed standing up from a chair using your arms (e.g., wheelchair or bedside chair)?: None Help needed to walk in hospital room?: A Little Help needed climbing 3-5 steps with a railing? : A Little 6 Click Score: 21    End of Session Equipment Utilized During Treatment: Gait belt;Oxygen Activity Tolerance: Patient tolerated treatment well Patient left: in bed;with call bell/phone within reach Nurse Communication: Mobility status PT Visit Diagnosis: Unsteadiness on feet (R26.81);Other abnormalities of gait and mobility (R26.89);Muscle weakness (generalized) (M62.81)     Time: 3143-8887 PT Time Calculation (min) (ACUTE ONLY): 36 min  Charges:  $Gait Training: 23-37 mins             Verdene Lennert, PT, DPT  Acute Rehabilitation [phone] pager #[phone] office  @ [facility]: [phone]            06/05/2019, 2:27 PM

## 2019-06-05 NOTE — Progress Notes (Signed)
Pharmacy Antibiotic Note  REGINALD WEIDA is a 64 y.o. male admitted on 05/31/2019 with COVID-19  pneumonia.  Patient has a h/o renal transplant on TAC/MMF (holding MMF this admission to resume at d/c per renal). Pharmacy has been consulted for cefepime dosing for HCAP.   Plan: -Continue cefepime 2 g iv q 12 h.  -F/U renal function, sputum culture, plans for antibiotics   Height: 5\' 11"  (180.3 cm) Weight: 201 lb 1 oz (91.2 kg) IBW/kg (Calculated) : 75.3  Temp (24hrs), Avg:98 F (36.7 C), Min:97.7 F (36.5 C), Max:98.5 F (36.9 C)  Recent Labs  Lab 06/01/19 0338 06/02/19 0428 06/03/19 0601 06/04/19 0300 06/05/19 0207  WBC 9.6 14.5* 14.3* 10.8* 17.5*  CREATININE 1.96* 1.92* 1.80* 1.82* 1.70*    Estimated Creatinine Clearance: 50.1 mL/min (A) (by C-G formula based on SCr of 1.7 mg/dL (H)).    No Known Allergies  Antimicrobials this admission: 2/10 cefepime >>   Dose adjustments this admission:   Microbiology results: Sputum:    Thank you for allowing pharmacy to be a part of this patient's care.  Ulice Dash D 06/05/2019 7:28 AM

## 2019-06-05 NOTE — Progress Notes (Addendum)
Pt worked w/ PT/OT today.  Pt sat both in the chair & bed-alternating back & forth. He did well with using his IS & flutter valve throughout the day. No c/o pain. Pt got on [facility] w/ assistance twice today. Changed/cleaned twice for accidents while using urinal. Call bell within reach, bed & chair in low locked position. A/Ox4 All questions & concerns addressed throughout day. Cont pulse ox & TELE monitoring. Yellow non-skid socks put on patient. Chair/bed alarm remain on. IV Remdesivir complete IV steroids continued

## 2019-06-05 NOTE — Progress Notes (Addendum)
PROGRESS NOTE  ROPER TOLSON RCB:[id number] DOB: [birth year] DOA: 05/31/2019 PCP: Celene Squibb, MD  HPI/Recap of past 65 hours: 64 year old male with past medical history of chorea, chronic kidney disease status post renal transplant in 2010 on immunosuppressive medication, chronic hepatitis C, diabetes mellitus and hypertension who tested positive for Covid by his PCP on 2/5 and then was directly admitted on 2/7 when he was noted to be hypoxic.  Following admission, patient was started on remdesivir and IV steroids.  Initially patient improved with being able to be weaned to room air CRP was trending downward and he was noting much improvement, but then on morning of 2/10, CRP increased from 3.3 up to 5.2 and patient needing increased oxygen supplementation.  Initially he was placed on a nonrebreather and then weaned down to 6 L nasal cannula.  At that time, he was also noted to be hypoglycemic.  Started on broad-spectrum antibiotics.  Today, patient is doing okay.  He is breathing a little bit easier.  Oxygen able to be weaned down from 6 L down to 4 L and at 4 L, his oxygenation is between 95 and 100%.  He has no complaints and denies any pain.  Had some episodes of hypoglycemia early this morning.  Assessment/Plan: Principal Problem:   Acute respiratory failure with hypoxia ([place]) from COVID-19 infection: He completed remdesivir on 2/11.  Not a candidate for Actemra given his immunosuppression medications.  His procalcitonin level was normal, however this is likely unreliable given that he is on immunosuppressive medications.  CT scan of chest unremarkable for PE and so I question if he had worsening cobacterial infection.  Have started broad-spectrum antibiotics.  BNP is normal and he has no previous history of heart failure.  We will follow daily weights.  Continue to try weaning down oxygen. Active Problems:   HCV infection: Chronic.    Essential hypertension: Blood pressure stable.,   Borderline low on admission initially  Stage IIIb CKD (chronic kidney disease) and patient with history of renal transplant on immunosuppressive medications: Renal function slowly improving, much closer not to baseline.  Continue his other medications.    HLD (hyperlipidemia): Continue statin.    H/O insulin dependent diabetes mellitus: CBGs with hypoglycemia today in the previous day.  Adjusted his Lantus from 35 units down to 30 on 2/11, but that was after he had received his Lantus.  Lantus further decreased to 20 units starting today.  Have stopped his scheduled NovoLog.  Likely as he is showing improvement, less hyperglycemia, so would actually consider this a sign of improvement.  Poorly controlled at home with A1c at 8.5.    Chorea: Noted, chronic.  At baseline.Recently changed from tetrabenazine to Austedo do toinsurance issues per recent neurology note.    Overweight (BMI 25.0-29.9): Meets criteria with BMI greater than 25    Hypothyroidism: Continue Synthroid.  Leukocytosis: Secondary to steroids.  We will continue to follow.  Code Status: Full code  Family Communication: Updated daughter by phone.  Disposition Plan: Home hopefully by Sunday if able to be weaned off of oxygen   Consultants:  None  Procedures:  None  Antimicrobials:  IV cefepime 2/10-2/14  IV Remdisivir 2/7-2/11  DVT prophylaxis: Lovenox   Objective: Vitals:   06/05/19 0700 06/05/19 1230  BP: 123/74   Pulse: 79 86  Resp: (!) 22 (!) 25  Temp: 98 F (36.7 C) 98 F (36.7 C)  SpO2: 100% 94%    Intake/Output Summary (Last 24 hours)  at 06/05/2019 1331 Last data filed at 06/05/2019 0500 Gross per 24 hour  Intake 2010 ml  Output 450 ml  Net 1560 ml   Filed Weights   05/31/19 1131  Weight: 91.2 kg   Body mass index is 28.04 kg/m.  Exam:   General: Alert and oriented x3, no acute distress  HEENT: Normocephalic and atraumatic, mucous brains are moist  Neck: Supple, no JVD   Cardiovascular: Regular rate and rhythm, S1-S2  Respiratory: Clear to auscultation bilaterally  Abdomen: Soft, nontender, nondistended, positive bowel sounds  Musculoskeletal: No clubbing or cyanosis or edema  Skin: No skin breaks, tears or lesions  Psychiatry: Appropriate, no evidence of psychoses   Data Reviewed: CBC: Recent Labs  Lab 06/01/19 0338 06/02/19 0428 06/03/19 0601 06/04/19 0300 06/05/19 0207  WBC 9.6 14.5* 14.3* 10.8* 17.5*  NEUTROABS 7.5 11.1* 11.4* 8.7* 13.6*  HGB 13.1 14.0 12.8* 12.1* 12.1*  HCT 40.3 41.4 38.3* 36.8* 36.2*  MCV 91.4 89.2 88.7 91.1 89.2  PLT 375 427* 399 341 546   Basic Metabolic Panel: Recent Labs  Lab 06/01/19 0338 06/01/19 0338 06/01/19 1350 06/02/19 0428 06/03/19 0601 06/04/19 0300 06/05/19 0207  NA 133*  --   --  138 137 136 137  K 6.5*   < > 5.8* 5.3* 5.3* 5.8* 5.5*  CL 108  --   --  112* 114* 113* 114*  CO2 16*  --   --  17* 16* 16* 16*  GLUCOSE 315*  --   --  64* 58* 177* 44*  BUN 79*  --   --  81* 79* 73* 62*  CREATININE 1.96*  --   --  1.92* 1.80* 1.82* 1.70*  CALCIUM 8.4*  --   --  8.8* 8.5* 8.3* 8.4*  MG 2.6*  --   --  2.7* 2.3 2.4 2.3   < > = values in this interval not displayed.   GFR: Estimated Creatinine Clearance: 50.1 mL/min (A) (by C-G formula based on SCr of 1.7 mg/dL (H)). Liver Function Tests: Recent Labs  Lab 06/01/19 0338 06/02/19 0428 06/03/19 0601 06/04/19 0300 06/05/19 0207  AST 22 25 24 21 23   ALT 18 19 17 17 20   ALKPHOS 67 71 71 65 71  BILITOT 0.5 0.7 0.5 0.5 0.7  PROT 5.5* 5.6* 5.5* 5.0* 5.0*  ALBUMIN 2.7* 2.7* 2.6* 2.4* 2.2*   No results for input(s): LIPASE, AMYLASE in the last 168 hours. No results for input(s): AMMONIA in the last 168 hours. Coagulation Profile: No results for input(s): INR, PROTIME in the last 168 hours. Cardiac Enzymes: No results for input(s): CKTOTAL, CKMB, CKMBINDEX, TROPONINI in the last 168 hours. BNP (last 3 results) No results for input(s): PROBNP in  the last 8760 hours. HbA1C: No results for input(s): HGBA1C in the last 72 hours. CBG: Recent Labs  Lab 06/05/19 0424 06/05/19 0447 06/05/19 0523 06/05/19 0745 06/05/19 1146  GLUCAP 41* 36* 153* 104* 121*   Lipid Profile: No results for input(s): CHOL, HDL, LDLCALC, TRIG, CHOLHDL, LDLDIRECT in the last 72 hours. Thyroid Function Tests: No results for input(s): TSH, T4TOTAL, FREET4, T3FREE, THYROIDAB in the last 72 hours. Anemia Panel: Recent Labs    06/03/19 0601 06/04/19 0300  FERRITIN 397* 488*   Urine analysis: No results found for: COLORURINE, APPEARANCEUR, LABSPEC, PHURINE, GLUCOSEU, HGBUR, BILIRUBINUR, KETONESUR, PROTEINUR, UROBILINOGEN, NITRITE, LEUKOCYTESUR Sepsis Labs: @LABRCNTIP (procalcitonin:4,lacticidven:4)  )No results found for this or any previous visit (from the past 240 hour(s)).    Studies: No results found.  Scheduled Meds: . vitamin C  500 mg Oral Daily  . atorvastatin  10 mg Oral q1800  . Deutetrabenazine  6 mg Oral BID  . dexamethasone (DECADRON) injection  6 mg Intravenous Q24H  . enoxaparin (LOVENOX) injection  40 mg Subcutaneous Q24H  . insulin aspart  0-4 Units Subcutaneous QHS  . insulin aspart  0-9 Units Subcutaneous TID WC  . insulin glargine  20 Units Subcutaneous Daily  . Ipratropium-Albuterol  1 puff Inhalation Q6H  . levothyroxine  100 mcg Oral QAC breakfast  . linagliptin  5 mg Oral Daily  . tacrolimus  1.5 mg Oral BID  . zinc sulfate  220 mg Oral Daily    Continuous Infusions: . ceFEPime (MAXIPIME) IV 2 g (06/05/19 0918)     LOS: 5 days     Annita Brod, MD [facility]   06/05/2019, 1:31 PM

## 2019-06-05 NOTE — Progress Notes (Signed)
Inpatient Diabetes Program Recommendations  AACE/ADA: New Consensus Statement on Inpatient Glycemic Control (2015)  Target Ranges:  Prepandial:   less than 140 mg/dL      Peak postprandial:   less than 180 mg/dL (1-2 hours)      Critically ill patients:  140 - 180 mg/dL   Lab Results  Component Value Date   GLUCAP 104 (H) 06/05/2019   HGBA1C 8.5 (H) 05/31/2019    Review of Glycemic Control Results for Nathan Tyler, Nathan Tyler (MRN [id number]) as of 06/05/2019 10:04  Ref. Range 06/05/2019 04:24 06/05/2019 04:47 06/05/2019 05:23 06/05/2019 07:45  Glucose-Capillary Latest Ref Range: 70 - 99 mg/dL 41 (LL) 36 (LL) 153 (H) 104 (H)   Diabetes history:Type 2 DM Outpatient Diabetes medications:Tresiba 20 units QD, Trulicity 1.5 Qwk Current orders for Inpatient glycemic control:Lantus 20 units QD, Novolog 0-9 units TID, Novolog 0-4 units QHS, Tradjenta 5 mg QD Decadron 6 mg QD  Inpatient Diabetes Program Recommendations:    Noted hypoglycemia of 36 mg/dL this AM and subsequent insulin adjustments. In agreement. Will follow.   Thanks, Bronson Curb, MSN, RNC-OB Diabetes Coordinator [phone] (8a-5p)

## 2019-06-05 NOTE — Progress Notes (Signed)
Lab called with a critical glucose 44 patient is again asymptomatic, evening bs was 44 it was taken by float RN she gave him snacks peanut butter and crackers , 2 juices and checked it twice after second snack bs came up to 70.  The float RN once again is checking protocal and will give d50 if it allow also will page md on call now.

## 2019-06-06 LAB — CBC
HCT: 34.4 % — ABNORMAL LOW (ref 39.0–52.0)
Hemoglobin: 11.3 g/dL — ABNORMAL LOW (ref 13.0–17.0)
MCH: 29.7 pg (ref 26.0–34.0)
MCHC: 32.8 g/dL (ref 30.0–36.0)
MCV: 90.3 fL (ref 80.0–100.0)
Platelets: 307 10*3/uL (ref 150–400)
RBC: 3.81 MIL/uL — ABNORMAL LOW (ref 4.22–5.81)
RDW: 13.4 % (ref 11.5–15.5)
WBC: 10.5 10*3/uL (ref 4.0–10.5)
nRBC: 0 % (ref 0.0–0.2)

## 2019-06-06 LAB — BASIC METABOLIC PANEL
Anion gap: 7 (ref 5–15)
BUN: 64 mg/dL — ABNORMAL HIGH (ref 8–23)
CO2: 15 mmol/L — ABNORMAL LOW (ref 22–32)
Calcium: 8.3 mg/dL — ABNORMAL LOW (ref 8.9–10.3)
Chloride: 112 mmol/L — ABNORMAL HIGH (ref 98–111)
Creatinine, Ser: 1.62 mg/dL — ABNORMAL HIGH (ref 0.61–1.24)
GFR calc Af Amer: 51 mL/min — ABNORMAL LOW (ref >60)
GFR calc non Af Amer: 44 mL/min — ABNORMAL LOW (ref >60)
Glucose, Bld: 215 mg/dL — ABNORMAL HIGH (ref 70–99)
Potassium: 6.6 mmol/L (ref 3.5–5.1)
Sodium: 134 mmol/L — ABNORMAL LOW (ref 135–145)

## 2019-06-06 LAB — C-REACTIVE PROTEIN: CRP: 16.1 mg/dL — ABNORMAL HIGH (ref <1.0)

## 2019-06-06 LAB — BRAIN NATRIURETIC PEPTIDE: B Natriuretic Peptide: 192.3 pg/mL — ABNORMAL HIGH (ref 0.0–100.0)

## 2019-06-06 LAB — GLUCOSE, CAPILLARY
Glucose-Capillary: 225 mg/dL — ABNORMAL HIGH (ref 70–99)
Glucose-Capillary: 125 mg/dL — ABNORMAL HIGH (ref 70–99)
Glucose-Capillary: 165 mg/dL — ABNORMAL HIGH (ref 70–99)
Glucose-Capillary: 290 mg/dL — ABNORMAL HIGH (ref 70–99)
Glucose-Capillary: 254 mg/dL — ABNORMAL HIGH (ref 70–99)
Glucose-Capillary: 261 mg/dL — ABNORMAL HIGH (ref 70–99)

## 2019-06-06 LAB — POTASSIUM
Potassium: 5.9 mmol/L — ABNORMAL HIGH (ref 3.5–5.1)
Potassium: 6.4 mmol/L (ref 3.5–5.1)

## 2019-06-06 MED ORDER — DEXTROSE 50 % IV SOLN
25.0000 mL | Freq: Once | INTRAVENOUS | Status: AC
  Administered 2019-06-06: 05:00:00 25 mL via INTRAVENOUS
  Filled 2019-06-06: qty 50

## 2019-06-06 MED ORDER — SODIUM BICARBONATE 8.4 % IV SOLN
50.0000 meq | Freq: Once | INTRAVENOUS | Status: AC
  Administered 2019-06-06: 05:00:00 50 meq via INTRAVENOUS
  Filled 2019-06-06: qty 50

## 2019-06-06 MED ORDER — DEXTROSE 50 % IV SOLN
25.0000 mL | Freq: Once | INTRAVENOUS | Status: AC
  Administered 2019-06-06: 25 mL via INTRAVENOUS
  Filled 2019-06-06: qty 50

## 2019-06-06 MED ORDER — INSULIN GLARGINE 100 UNIT/ML ~~LOC~~ SOLN
4.0000 [IU] | SUBCUTANEOUS | Status: AC
  Administered 2019-06-06: 15:00:00 4 [IU] via SUBCUTANEOUS
  Filled 2019-06-06: qty 0.04

## 2019-06-06 MED ORDER — SODIUM ZIRCONIUM CYCLOSILICATE 5 G PO PACK
5.0000 g | PACK | Freq: Two times a day (BID) | ORAL | Status: AC
  Administered 2019-06-06 (×2): 5 g via ORAL
  Filled 2019-06-06 (×2): qty 1

## 2019-06-06 MED ORDER — INSULIN ASPART 100 UNIT/ML IV SOLN
5.0000 [IU] | Freq: Once | INTRAVENOUS | Status: AC
  Administered 2019-06-06: 5 [IU] via INTRAVENOUS

## 2019-06-06 MED ORDER — SODIUM POLYSTYRENE SULFONATE 15 GM/60ML PO SUSP
15.0000 g | Freq: Once | ORAL | Status: AC
  Administered 2019-06-06: 15 g via ORAL
  Filled 2019-06-06: qty 60

## 2019-06-06 MED ORDER — INSULIN ASPART 100 UNIT/ML IV SOLN
5.0000 [IU] | Freq: Once | INTRAVENOUS | Status: AC
  Administered 2019-06-06: 05:00:00 5 [IU] via INTRAVENOUS

## 2019-06-06 MED ORDER — INSULIN GLARGINE 100 UNIT/ML ~~LOC~~ SOLN
24.0000 [IU] | Freq: Every day | SUBCUTANEOUS | Status: DC
  Administered 2019-06-07 – 2019-06-14 (×8): 24 [IU] via SUBCUTANEOUS
  Filled 2019-06-06 (×9): qty 0.24

## 2019-06-06 NOTE — Progress Notes (Signed)
PROGRESS NOTE  Nathan Tyler TWS:[id number] DOB: 01/27/1956 DOA: 05/31/2019 PCP: Celene Squibb, MD  HPI/Recap of past 33 hours: 64 year old male with past medical history of chorea, chronic kidney disease status post renal transplant in 2010 on immunosuppressive medication, chronic hepatitis C, diabetes mellitus and hypertension who tested positive for Covid by his PCP on 2/5 and then was directly admitted on 2/7 when he was noted to be hypoxic.  Following admission, patient was started on remdesivir and IV steroids.  Initially patient improved with being able to be weaned to room air CRP was trending downward and he was noting much improvement, but then on morning of 2/10, CRP increased from 3.3 up to 5.2 and patient needing increased oxygen supplementation.  Initially he was placed on a nonrebreather and then weaned down to 6 L nasal cannula.  At that time, he was also noted to be hypoglycemic.  Started on broad-spectrum antibiotics.  Today, patient is doing okay.  He is breathing a little bit easier.  Oxygen able to be weaned down from 6 L down to 4 L on 2/12.  Since then, patient states his breathing still feels rough.  Patient's hospital course is also been complicated with episodes of hypoglycemia and hyperkalemia.  Assessment/Plan: Principal Problem:   Acute respiratory failure with hypoxia ([place]) from COVID-19 infection: He completed remdesivir on 2/11.  Not a candidate for Actemra given his immunosuppression medications.  His procalcitonin level was normal, however this is likely unreliable given that he is on immunosuppressive medications.  CT scan of chest unremarkable for PE and so I question if he had worsening cobacterial infection.  Have started broad-spectrum antibiotics.  BNP is normal and he has no previous history of heart failure.  We will follow daily weights.  Continue to try weaning down oxygen.  Have encouraged prone position. Active Problems:   HCV infection: Chronic.   Essential hypertension: Blood pressure stable.,  Borderline low on admission initially  Stage IIIb CKD (chronic kidney disease) and patient with history of renal transplant on immunosuppressive medications: Renal function slowly improving, much closer not to baseline.  Continue his other medications.    HLD (hyperlipidemia): Continue statin.    H/O insulin dependent diabetes mellitus: CBGs with hypoglycemia today in the previous day.  Adjusted his Lantus from 35 units down to 30 on 2/11, but that was after he had received his Lantus.  Lantus further decreased to 20 units starting today.  Have stopped his scheduled NovoLog.  Likely as he is showing improvement, less hyperglycemia, so would actually consider this a sign of improvement.  Poorly controlled at home with A1c at 8.5.  Has had some mildly elevated CBGs in the past 24 hours, so have slightly increased his Lantus  Hyperkalemia: Suspect secondary to continued high-dose IV steroids.  Treating with glucose/insulin following levels.  Stable.  No cardiac changes.    Chorea: Noted, chronic.  At baseline.Recently changed from tetrabenazine to Austedo do toinsurance issues per recent neurology note.    Overweight (BMI 25.0-29.9): Meets criteria with BMI greater than 25    Hypothyroidism: Continue Synthroid.  Leukocytosis: Secondary to steroids.  We will continue to follow.  Code Status: Full code  Family Communication: Updated daughter by phone.  Disposition Plan: Home hopefully in a few days once weaned off of oxygen   Consultants:  None  Procedures:  None  Antimicrobials:  IV cefepime 2/10-2/14  IV Remdisivir 2/7-2/11  DVT prophylaxis: Lovenox   Objective: Vitals:   06/06/19 0839  06/06/19 1300  BP:  (!) 120/96  Pulse:    Resp:  (!) 22  Temp: 98.6 F (37 C) 97.6 F (36.4 C)  SpO2:      Intake/Output Summary (Last 24 hours) at 06/06/2019 1442 Last data filed at 06/06/2019 0324 Gross per 24 hour  Intake 560 ml   Output 300 ml  Net 260 ml   Filed Weights   05/31/19 1131  Weight: 91.2 kg   Body mass index is 28.04 kg/m.  Exam:   General: Alert and oriented x3, no acute distress  HEENT: Normocephalic and atraumatic, mucous brains are moist  Neck: Supple, no JVD  Cardiovascular: Regular rate and rhythm, S1-S2  Respiratory: Clear to auscultation bilaterally  Abdomen: Soft, nontender, nondistended, positive bowel sounds  Musculoskeletal: No clubbing or cyanosis or edema  Skin: No skin breaks, tears or lesions  Psychiatry: Appropriate, no evidence of psychoses   Data Reviewed: CBC: Recent Labs  Lab 06/01/19 0338 06/01/19 0338 06/02/19 0428 06/03/19 0601 06/04/19 0300 06/05/19 0207 06/06/19 0100  WBC 9.6   < > 14.5* 14.3* 10.8* 17.5* 10.5  NEUTROABS 7.5  --  11.1* 11.4* 8.7* 13.6*  --   HGB 13.1   < > 14.0 12.8* 12.1* 12.1* 11.3*  HCT 40.3   < > 41.4 38.3* 36.8* 36.2* 34.4*  MCV 91.4   < > 89.2 88.7 91.1 89.2 90.3  PLT 375   < > 427* 399 341 354 307   < > = values in this interval not displayed.   Basic Metabolic Panel: Recent Labs  Lab 06/01/19 0338 06/01/19 1350 06/02/19 0428 06/02/19 0428 06/03/19 0601 06/04/19 0300 06/05/19 0207 06/06/19 0100 06/06/19 0910  NA 133*  --  138  --  137 136 137 134*  --   K 6.5*   < > 5.3*   < > 5.3* 5.8* 5.5* 6.6* 5.9*  CL 108  --  112*  --  114* 113* 114* 112*  --   CO2 16*  --  17*  --  16* 16* 16* 15*  --   GLUCOSE 315*  --  64*  --  58* 177* 44* 215*  --   BUN 79*  --  81*  --  79* 73* 62* 64*  --   CREATININE 1.96*  --  1.92*  --  1.80* 1.82* 1.70* 1.62*  --   CALCIUM 8.4*  --  8.8*  --  8.5* 8.3* 8.4* 8.3*  --   MG 2.6*  --  2.7*  --  2.3 2.4 2.3  --   --    < > = values in this interval not displayed.   GFR: Estimated Creatinine Clearance: 52.5 mL/min (A) (by C-G formula based on SCr of 1.62 mg/dL (H)). Liver Function Tests: Recent Labs  Lab 06/01/19 0338 06/02/19 0428 06/03/19 0601 06/04/19 0300  06/05/19 0207  AST 22 25 24 21 23   ALT 18 19 17 17 20   ALKPHOS 67 71 71 65 71  BILITOT 0.5 0.7 0.5 0.5 0.7  PROT 5.5* 5.6* 5.5* 5.0* 5.0*  ALBUMIN 2.7* 2.7* 2.6* 2.4* 2.2*   No results for input(s): LIPASE, AMYLASE in the last 168 hours. No results for input(s): AMMONIA in the last 168 hours. Coagulation Profile: No results for input(s): INR, PROTIME in the last 168 hours. Cardiac Enzymes: No results for input(s): CKTOTAL, CKMB, CKMBINDEX, TROPONINI in the last 168 hours. BNP (last 3 results) No results for input(s): PROBNP in the last 8760 hours. HbA1C: No  results for input(s): HGBA1C in the last 72 hours. CBG: Recent Labs  Lab 06/05/19 1610 06/05/19 1946 06/06/19 0622 06/06/19 1239 06/06/19 1329  GLUCAP 276* 318* 225* 165* 125*   Lipid Profile: No results for input(s): CHOL, HDL, LDLCALC, TRIG, CHOLHDL, LDLDIRECT in the last 72 hours. Thyroid Function Tests: No results for input(s): TSH, T4TOTAL, FREET4, T3FREE, THYROIDAB in the last 72 hours. Anemia Panel: Recent Labs    06/04/19 0300  FERRITIN 488*   Urine analysis: No results found for: COLORURINE, APPEARANCEUR, LABSPEC, PHURINE, GLUCOSEU, HGBUR, BILIRUBINUR, KETONESUR, PROTEINUR, UROBILINOGEN, NITRITE, LEUKOCYTESUR Sepsis Labs: @LABRCNTIP (procalcitonin:4,lacticidven:4)  )No results found for this or any previous visit (from the past 240 hour(s)).    Studies: No results found.  Scheduled Meds: . vitamin C  500 mg Oral Daily  . atorvastatin  10 mg Oral q1800  . Deutetrabenazine  6 mg Oral BID  . dexamethasone (DECADRON) injection  6 mg Intravenous Q24H  . enoxaparin (LOVENOX) injection  40 mg Subcutaneous Q24H  . insulin aspart  0-4 Units Subcutaneous QHS  . insulin aspart  0-9 Units Subcutaneous TID WC  . [START ON 06/07/2019] insulin glargine  24 Units Subcutaneous Daily  . insulin glargine  4 Units Subcutaneous NOW  . Ipratropium-Albuterol  1 puff Inhalation Q6H  . levothyroxine  100 mcg Oral QAC  breakfast  . linagliptin  5 mg Oral Daily  . sodium zirconium cyclosilicate  5 g Oral BID  . tacrolimus  1.5 mg Oral BID  . zinc sulfate  220 mg Oral Daily    Continuous Infusions: . ceFEPime (MAXIPIME) IV 2 g (06/06/19 0921)     LOS: 6 days     Annita Brod, MD [facility]   06/06/2019, 2:42 PM

## 2019-06-06 NOTE — Progress Notes (Signed)
Spoke with patients girlfriend briefly today while I was in pt room. No questions or concerns.  Pt sat in recliner for several hours this am, used IS (needs frequent reminders),pt was also prone this evening for about 2 hours.  K was 5.9 this am & then 6.4 this afternoon **this was after insulin & dextrose admin (see MAR for details) I paged MD [place] ordered & administered. Spoke w/ tele & they confirmed pt had no abnormal rhythm changes today.  Pt becomes very DOE & requires 8-11L this evening w/ activity 5-6L @rest 

## 2019-06-07 ENCOUNTER — Inpatient Hospital Stay (HOSPITAL_COMMUNITY): Payer: Medicare Other

## 2019-06-07 LAB — BASIC METABOLIC PANEL
Anion gap: 6 (ref 5–15)
BUN: 64 mg/dL — ABNORMAL HIGH (ref 8–23)
CO2: 18 mmol/L — ABNORMAL LOW (ref 22–32)
Calcium: 8.5 mg/dL — ABNORMAL LOW (ref 8.9–10.3)
Chloride: 111 mmol/L (ref 98–111)
Creatinine, Ser: 1.54 mg/dL — ABNORMAL HIGH (ref 0.61–1.24)
GFR calc Af Amer: 54 mL/min — ABNORMAL LOW (ref >60)
GFR calc non Af Amer: 47 mL/min — ABNORMAL LOW (ref >60)
Glucose, Bld: 187 mg/dL — ABNORMAL HIGH (ref 70–99)
Potassium: 6.8 mmol/L (ref 3.5–5.1)
Sodium: 135 mmol/L (ref 135–145)

## 2019-06-07 LAB — GLUCOSE, CAPILLARY
Glucose-Capillary: 147 mg/dL — ABNORMAL HIGH (ref 70–99)
Glucose-Capillary: 198 mg/dL — ABNORMAL HIGH (ref 70–99)
Glucose-Capillary: 269 mg/dL — ABNORMAL HIGH (ref 70–99)
Glucose-Capillary: 289 mg/dL — ABNORMAL HIGH (ref 70–99)

## 2019-06-07 LAB — CBC
HCT: 35.6 % — ABNORMAL LOW (ref 39.0–52.0)
Hemoglobin: 11.8 g/dL — ABNORMAL LOW (ref 13.0–17.0)
MCH: 29.6 pg (ref 26.0–34.0)
MCHC: 33.1 g/dL (ref 30.0–36.0)
MCV: 89.2 fL (ref 80.0–100.0)
Platelets: 328 10*3/uL (ref 150–400)
RBC: 3.99 MIL/uL — ABNORMAL LOW (ref 4.22–5.81)
RDW: 13.4 % (ref 11.5–15.5)
WBC: 9.7 10*3/uL (ref 4.0–10.5)
nRBC: 0 % (ref 0.0–0.2)

## 2019-06-07 LAB — C-REACTIVE PROTEIN: CRP: 7.9 mg/dL — ABNORMAL HIGH (ref <1.0)

## 2019-06-07 MED ORDER — FUROSEMIDE 10 MG/ML IJ SOLN
20.0000 mg | Freq: Two times a day (BID) | INTRAMUSCULAR | Status: DC
  Administered 2019-06-07 – 2019-06-11 (×9): 20 mg via INTRAVENOUS
  Filled 2019-06-07 (×9): qty 2

## 2019-06-07 MED ORDER — CALCIUM GLUCONATE 10 % IV SOLN
1.0000 g | Freq: Once | INTRAVENOUS | Status: DC
  Filled 2019-06-07: qty 10

## 2019-06-07 MED ORDER — CALCIUM GLUCONATE-NACL 1-0.675 GM/50ML-% IV SOLN
1.0000 g | Freq: Once | INTRAVENOUS | Status: AC
  Administered 2019-06-07: 09:00:00 1000 mg via INTRAVENOUS
  Filled 2019-06-07: qty 50

## 2019-06-07 MED ORDER — FUROSEMIDE 10 MG/ML IJ SOLN
40.0000 mg | Freq: Once | INTRAMUSCULAR | Status: AC
  Administered 2019-06-07: 40 mg via INTRAVENOUS
  Filled 2019-06-07: qty 4

## 2019-06-07 MED ORDER — SODIUM POLYSTYRENE SULFONATE 15 GM/60ML PO SUSP
30.0000 g | Freq: Once | ORAL | Status: AC
  Administered 2019-06-07: 09:00:00 30 g via ORAL
  Filled 2019-06-07: qty 120

## 2019-06-07 MED ORDER — DEUTETRABENAZINE 6 MG PO TABS
6.0000 mg | ORAL_TABLET | Freq: Two times a day (BID) | ORAL | Status: DC
  Administered 2019-06-07 – 2019-06-16 (×18): 6 mg via ORAL
  Filled 2019-06-07 (×12): qty 1

## 2019-06-07 NOTE — CV Procedure (Signed)
2D echo attempted, but consult with patient. Will try echo later.

## 2019-06-07 NOTE — Progress Notes (Signed)
PROGRESS NOTE  Nathan Tyler XNA:[id number] DOB: March 22, 1956 DOA: 05/31/2019 PCP: Celene Squibb, MD  HPI/Recap of past 72 hours: 64 year old male with past medical history of chorea, chronic kidney disease status post renal transplant in 2010 on immunosuppressive medication, chronic hepatitis C, diabetes mellitus and hypertension who tested positive for Covid by his PCP on 2/5 and then was directly admitted on 2/7 when he was noted to be hypoxic.  Following admission, patient was started on remdesivir and IV steroids.  Initially patient improved with being able to be weaned to room air CRP was trending downward and he was noting much improvement, but then on morning of 2/10, CRP increased from 3.3 up to 5.2 and patient needing increased oxygen supplementation.  Initially he was placed on a nonrebreather and then weaned down to 6 L nasal cannula.  At that time, he was also noted to be hypoglycemic.  Started on broad-spectrum antibiotics.  Number next 2 days, patient initially showed signs of improvement with improvement in oxygenation, but for the last few days, oxygen needs have started to trend back upward.  His CRP is slowly trending downward and course has been complicated by episodes of hypoglycemia and hyperkalemia.  Patient himself feels like breathing is rough for today.  Assessment/Plan: Principal Problem:   Acute respiratory failure with hypoxia (Meta) from COVID-19 infection: He completed remdesivir on 2/11.  Not a candidate for Actemra given his immunosuppression medications.  His procalcitonin level was normal, however this is likely unreliable given that he is on immunosuppressive medications.  CT scan of chest unremarkable for PE and so I question if he had worsening cobacterial infection.  He completes 5-day course of cefepime as of 2/14.  Will need to continue steroids until CRP is much more closer to normal.  Encouraging incentive spirometry and prone position.  In review of his chest  x-ray from 2/7, edema noted although no previous history of CHF.  Have started IV Lasix as patient looks to be 3.5 L positive and checking an echocardiogram. Active Problems:   HCV infection: Chronic.    Essential hypertension: Blood pressure stable.,  Borderline low on admission initially  Stage IIIb CKD (chronic kidney disease) and patient with history of renal transplant on immunosuppressive medications: Renal function slowly improving, much closer not to baseline.  Continue his other medications.    HLD (hyperlipidemia): Continue statin.    H/O insulin dependent diabetes mellitus: CBGs with hypoglycemia today in the previous day.  Adjusted his Lantus from 35 units down to 30 on 2/11, but that was after he had received his Lantus.  Lantus further decreased to 20 units starting today.  Have stopped his scheduled NovoLog.  Likely as he is showing improvement, less hyperglycemia, so would actually consider this a sign of improvement.  Poorly controlled at home with A1c at 8.5.  CBGs in last 24 hours have been better  Hyperkalemia: Suspect secondary to continued high-dose IV steroids.  Treating with glucose/insulin following levels.  Stable.  No cardiac changes.  This should improve with Lasix    Chorea: Noted, chronic.  At baseline.Recently changed from tetrabenazine to [place] do toinsurance issues per recent neurology note.    Overweight (BMI 25.0-29.9): Meets criteria with BMI greater than 25    Hypothyroidism: Continue Synthroid.  Leukocytosis: Secondary to steroids.  Resolved  Code Status: Full code  Family Communication: Updated daughter by phone.  Disposition Plan: Home hopefully in a few days once weaned off of oxygen   Consultants:  None  Procedures:  None  Antimicrobials:  IV cefepime 2/10-2/14  IV Remdisivir 2/7-2/11  DVT prophylaxis: Lovenox   Objective: Vitals:   06/07/19 0805 06/07/19 1137  BP: 131/72 124/78  Pulse: 72 94  Resp: (!) 23 19  Temp:  98.4 F (36.9 C) 97.7 F (36.5 C)  SpO2: 93% 96%    Intake/Output Summary (Last 24 hours) at 06/07/2019 1500 Last data filed at 06/07/2019 1000 Gross per 24 hour  Intake 240 ml  Output -  Net 240 ml   Filed Weights   05/31/19 1131  Weight: 91.2 kg   Body mass index is 28.04 kg/m.  Exam:   General: Alert and oriented x3, no acute distress  HEENT: Normocephalic and atraumatic, mucous brains are moist  Neck: Supple, no JVD  Cardiovascular: Regular rate and rhythm, S1-S2  Respiratory: Clear to auscultation bilaterally  Abdomen: Soft, nontender, nondistended, positive bowel sounds  Musculoskeletal: No clubbing or cyanosis or edema  Skin: No skin breaks, tears or lesions  Psychiatry: Appropriate, no evidence of psychoses   Data Reviewed: CBC: Recent Labs  Lab 06/01/19 0338 06/01/19 0338 06/02/19 0428 06/02/19 0428 06/03/19 0601 06/04/19 0300 06/05/19 0207 06/06/19 0100 06/07/19 0450  WBC 9.6   < > 14.5*   < > 14.3* 10.8* 17.5* 10.5 9.7  NEUTROABS 7.5  --  11.1*  --  11.4* 8.7* 13.6*  --   --   HGB 13.1   < > 14.0   < > 12.8* 12.1* 12.1* 11.3* 11.8*  HCT 40.3   < > 41.4   < > 38.3* 36.8* 36.2* 34.4* 35.6*  MCV 91.4   < > 89.2   < > 88.7 91.1 89.2 90.3 89.2  PLT 375   < > 427*   < > 399 341 354 307 328   < > = values in this interval not displayed.   Basic Metabolic Panel: Recent Labs  Lab 06/01/19 0338 06/01/19 1350 06/02/19 0428 06/02/19 0428 06/03/19 0601 06/03/19 0601 06/04/19 0300 06/04/19 0300 06/05/19 0207 06/06/19 0100 06/06/19 0910 06/06/19 1540 06/07/19 0450  NA 133*  --  138   < > 137  --  136  --  137 134*  --   --  135  K 6.5*   < > 5.3*   < > 5.3*   < > 5.8*   < > 5.5* 6.6* 5.9* 6.4* 6.8*  CL 108  --  112*   < > 114*  --  113*  --  114* 112*  --   --  111  CO2 16*  --  17*   < > 16*  --  16*  --  16* 15*  --   --  18*  GLUCOSE 315*  --  64*   < > 58*  --  177*  --  44* 215*  --   --  187*  BUN 79*  --  81*   < > 79*  --  73*  --   62* 64*  --   --  64*  CREATININE 1.96*  --  1.92*   < > 1.80*  --  1.82*  --  1.70* 1.62*  --   --  1.54*  CALCIUM 8.4*  --  8.8*   < > 8.5*  --  8.3*  --  8.4* 8.3*  --   --  8.5*  MG 2.6*  --  2.7*  --  2.3  --  2.4  --  2.3  --   --   --   --    < > =  values in this interval not displayed.   GFR: Estimated Creatinine Clearance: 55.3 mL/min (A) (by C-G formula based on SCr of 1.54 mg/dL (H)). Liver Function Tests: Recent Labs  Lab 06/01/19 0338 06/02/19 0428 06/03/19 0601 06/04/19 0300 06/05/19 0207  AST 22 25 24 21 23   ALT 18 19 17 17 20   ALKPHOS 67 71 71 65 71  BILITOT 0.5 0.7 0.5 0.5 0.7  PROT 5.5* 5.6* 5.5* 5.0* 5.0*  ALBUMIN 2.7* 2.7* 2.6* 2.4* 2.2*   No results for input(s): LIPASE, AMYLASE in the last 168 hours. No results for input(s): AMMONIA in the last 168 hours. Coagulation Profile: No results for input(s): INR, PROTIME in the last 168 hours. Cardiac Enzymes: No results for input(s): CKTOTAL, CKMB, CKMBINDEX, TROPONINI in the last 168 hours. BNP (last 3 results) No results for input(s): PROBNP in the last 8760 hours. HbA1C: No results for input(s): HGBA1C in the last 72 hours. CBG: Recent Labs  Lab 06/06/19 1632 06/06/19 1925 06/06/19 2001 06/07/19 0803 06/07/19 1134  GLUCAP 290* 254* 261* 147* 198*   Lipid Profile: No results for input(s): CHOL, HDL, LDLCALC, TRIG, CHOLHDL, LDLDIRECT in the last 72 hours. Thyroid Function Tests: No results for input(s): TSH, T4TOTAL, FREET4, T3FREE, THYROIDAB in the last 72 hours. Anemia Panel: No results for input(s): VITAMINB12, FOLATE, FERRITIN, TIBC, IRON, RETICCTPCT in the last 72 hours. Urine analysis: No results found for: COLORURINE, APPEARANCEUR, LABSPEC, PHURINE, GLUCOSEU, HGBUR, BILIRUBINUR, KETONESUR, PROTEINUR, UROBILINOGEN, NITRITE, LEUKOCYTESUR Sepsis Labs: @LABRCNTIP (procalcitonin:4,lacticidven:4)  )No results found for this or any previous visit (from the past 240 hour(s)).    Studies: No  results found.  Scheduled Meds: . vitamin C  500 mg Oral Daily  . atorvastatin  10 mg Oral q1800  . Deutetrabenazine  6 mg Oral BID  . dexamethasone (DECADRON) injection  6 mg Intravenous Q24H  . enoxaparin (LOVENOX) injection  40 mg Subcutaneous Q24H  . furosemide  20 mg Intravenous Q12H  . insulin aspart  0-4 Units Subcutaneous QHS  . insulin aspart  0-9 Units Subcutaneous TID WC  . insulin glargine  24 Units Subcutaneous Daily  . Ipratropium-Albuterol  1 puff Inhalation Q6H  . levothyroxine  100 mcg Oral QAC breakfast  . linagliptin  5 mg Oral Daily  . tacrolimus  1.5 mg Oral BID  . zinc sulfate  220 mg Oral Daily    Continuous Infusions: . ceFEPime (MAXIPIME) IV Stopped (06/07/19 1139)     LOS: 7 days     Annita Brod, MD [facility]   06/07/2019, 3:00 PM

## 2019-06-07 NOTE — Progress Notes (Signed)
Pharmacy Medication Storage Note  Storing home medications for Nathan Tyler in pharmacy secured storage.   Medication storage bag number: [id number]  Delivered to pharmacy @ 17:30 (time) by Loren Racer (RN name)  Medications will be returned to patient/caregiver upon discharge.  Peggyann Juba, PharmD, BCPS Pharmacy: [phone] 06/07/19 5:24 PM

## 2019-06-07 NOTE — Progress Notes (Signed)
Pt gave instructions to give his girlfriend Terri his keys and wallet. Girlfriend came to retrieve those patient belongings. When pt picked up belongings, she also dropped off pt home med, Deutetrabenazine TABS. Medication was given to pharmacy so pt is able to take meds while here.

## 2019-06-07 NOTE — Progress Notes (Signed)
Physical Therapy Treatment Patient Details Name: Nathan Tyler MRN: [id number] DOB: 1955/05/04 Today's Date: 06/07/2019    History of Present Illness Pt is a 64 y.o. male directly admitted from [place] infusion clinic on 05/31/19 with progressive SOB, found to be hypoxic; pt tested (+) COVID-19 on 2/5. PMH includes neuromuscular disorder, chorea, DM, CKD (s/p renal transplant 2010), R foot 5th and L 2nd toe amputations.    PT Comments    Very pleasant male patient with positive attitude. Does require assistance for all transfers and ambulation and does best with RW and MIN Guard for safety. Reviewed LE ex and performed same- 10 reps each. Reviewed and practiced pursed lip breathing as well as IS /Flutter Valve unit with good return demo. Should continue to benefit from PT to address goals as outlined for optimal functional outcomes.Was in [facility] chair with remote/call light, telephone in reach, BS table in reach and covers over lap. - Chair alarm activated.  Follow Up Recommendations  Home health PT;Supervision/Assistance - 24 hour     Equipment Recommendations  Rolling walker with 5" wheels    Recommendations for Other Services       Precautions / Restrictions Precautions Precautions: Fall Precaution Comments: chorea, monitor SpO2 Restrictions Weight Bearing Restrictions: No Other Position/Activity Restrictions: Monitor closely due to his constant spastic/ataxic type movements. He relates that he does not use any AD for ambulation- and that he has not had any falls.    Mobility  Bed Mobility Overal bed mobility: Modified Independent             General bed mobility comments: Pt seated in bedside chair upon PT arrival.  Transfers Overall transfer level: Needs assistance Equipment used: Rolling walker (2 wheeled)(May use HHA- but did use RW for short distance in PT EVAL) Transfers: Sit to/from Stand Sit to Stand: Min guard         General transfer comment: Assist to ensure  balance and safety.   Ambulation/Gait Ambulation/Gait assistance: Min guard Gait Distance (Feet): 18 Feet Assistive device: Rolling walker (2 wheeled) Gait Pattern/deviations: Step-through pattern;Ataxic         Stairs             Wheelchair Mobility    Modified Rankin (Stroke Patients Only)       Balance Overall balance assessment: Needs assistance Sitting-balance support: No upper extremity supported;Feet supported Sitting balance-Leahy Scale: Good Sitting balance - Comments: Tends to almost be constantly moving throughout trunk and all extremities due to the Chorea   Standing balance support: Bilateral upper extremity supported;During functional activity Standing balance-Leahy Scale: Poor Standing balance comment: needs external assist in standing.                             Cognition Arousal/Alertness: Awake/alert Behavior During Therapy: WFL for tasks assessed/performed Overall Cognitive Status: Within Functional Limits for tasks assessed                                 General Comments: He is very pleasant and cooperative.      Exercises General Exercises - Lower Extremity Ankle Circles/Pumps: Seated;AROM;10 reps Short Arc Quad: AROM;Seated;10 reps Hip ABduction/ADduction: AROM;Seated;10 reps Hip Flexion/Marching: AROM;Seated;10 reps Other Exercises Other Exercises: Flutter valve x 10 with min cues on technique. Other Exercises: Incentive spirometer x 10 with min cues on technique.  Other Exercises: Encouraged pursed lip breathing throughout.  General Comments General comments (skin integrity, edema, etc.): Monitor closely SPO2, skin  and joint integrity. He has a very positive attitude      Pertinent Vitals/Pain Pain Assessment: No/denies pain    Home Living                      Prior Function            PT Goals (current goals can now be found in the care plan section) Acute Rehab PT Goals Patient  Stated Goal: Just want to get well and get home safely PT Goal Formulation: With patient Time For Goal Achievement: 06/16/19 Potential to Achieve Goals: Good Progress towards PT goals: Progressing toward goals    Frequency    Min 3X/week      PT Plan Current plan remains appropriate    Co-evaluation              AM-PAC PT "6 Clicks" Mobility   Outcome Measure  Help needed turning from your back to your side while in a flat bed without using bedrails?: None Help needed moving from lying on your back to sitting on the side of a flat bed without using bedrails?: A Little Help needed moving to and from a bed to a chair (including a wheelchair)?: A Little Help needed standing up from a chair using your arms (e.g., wheelchair or bedside chair)?: None Help needed to walk in hospital room?: A Little Help needed climbing 3-5 steps with a railing? : A Lot 6 Click Score: 19    End of Session Equipment Utilized During Treatment: Oxygen Activity Tolerance: Patient tolerated treatment well Patient left: in chair;with call bell/phone within reach;with chair alarm set Nurse Communication: Mobility status PT Visit Diagnosis: Unsteadiness on feet (R26.81);Other abnormalities of gait and mobility (R26.89);Muscle weakness (generalized) (M62.81)     Time: 5456-2563 PT Time Calculation (min) (ACUTE ONLY): 36 min  Charges:  $Therapeutic Exercise: 8-22 mins $Therapeutic Activity: 8-22 mins                    Rollen Sox, PT # [phone] CGV cell   Casandra Doffing 06/07/2019, 5:32 PM

## 2019-06-07 NOTE — Progress Notes (Signed)
Occupational Therapy Treatment Patient Details Name: Nathan Tyler MRN: [id number] DOB: 12/28/1955 Today's Date: 06/07/2019    History of present illness Pt is a 64 y.o. male directly admitted from [place] infusion clinic on 05/31/19 with progressive SOB, found to be hypoxic; pt tested (+) COVID-19 on 2/5. PMH includes neuromuscular disorder, chorea, DM, CKD (s/p renal transplant 2010), R foot 5th and L 2nd toe amputations.   OT comments  Pt making progress in therapy, however requires increased supplemental oxygen this date compared to previous session. Pt currently on 10L HFNC. Continued education with pt on safety strategies, activity pacing, and fall prevention techniques with good understanding. Pt tolerated standing 1 x 6 min and 1 x 4 min at the sink to complete grooming and sponge bathing task. Pt required cues throughout to slow pace in order to increase balance and safety. SpO2 dropped to 81% during activity with pt reporting min to mod shortness of breath. Continued education/instruction with pt on pursed lip breathing techniques with good understanding and follow through. Pt required ~2 min seated recovery to return to 90s. RN updated. OT will continue to follow acutely.    Follow Up Recommendations  Home health OT;Supervision/Assistance - 24 hour    Equipment Recommendations  3 in 1 bedside commode(for use in shower)    Recommendations for Other Services      Precautions / Restrictions Precautions Precautions: Fall;Other (comment) Precaution Comments: chorea, monitor SpO2 Restrictions Weight Bearing Restrictions: No       Mobility Bed Mobility               General bed mobility comments: Pt seated in bedside chair upon OT arrival.   Transfers Overall transfer level: Needs assistance Equipment used: Rolling walker (2 wheeled) Transfers: Sit to/from Stand Sit to Stand: Min guard         General transfer comment: Assist to ensure balance and safety.     Balance  Overall balance assessment: Needs assistance Sitting-balance support: No upper extremity supported;Feet supported Sitting balance-Leahy Scale: Good Sitting balance - Comments: does move around a bit due to chorea   Standing balance support: Bilateral upper extremity supported;During functional activity Standing balance-Leahy Scale: Poor                             ADL either performed or assessed with clinical judgement   ADL Overall ADL's : Needs assistance/impaired     Grooming: Wash/dry hands;Wash/dry face;Brushing hair;Supervision/safety;Min guard;Standing   Upper Body Bathing: Supervision/ safety;Min guard;Standing   Lower Body Bathing: Supervison/ safety;Min guard;Sit to/from stand       Lower Body Dressing: Supervision/safety;Sitting/lateral leans               Functional mobility during ADLs: Min guard;Rolling walker General ADL Comments: Pt able to ambulate short distance to bedroom sink to complete ADLs. Noted 0 instances of LOB, however pt unsteady on feet requiring cues for safety.      Vision       Perception     Praxis      Cognition Arousal/Alertness: Awake/alert Behavior During Therapy: WFL for tasks assessed/performed Overall Cognitive Status: Within Functional Limits for tasks assessed                                          Exercises Exercises: Other exercises Other Exercises Other Exercises: Flutter  valve x 10 with min cues on technique. Other Exercises: Incentive spirometer x 10 with min cues on technique.  Other Exercises: Encouraged pursed lip breathing throughout.    Shoulder Instructions       General Comments Pt on 10L HFNC with SpO2 dropping to 81% during activity. Pt required ~2 min seated recovery to return to 90s.      Pertinent Vitals/ Pain       Pain Assessment: No/denies pain  Home Living                                          Prior Functioning/Environment               Frequency           Progress Toward Goals  OT Goals(current goals can now be found in the care plan section)  Progress towards OT goals: Progressing toward goals  ADL Goals Pt Will Perform Grooming: with modified independence;standing Pt Will Perform Upper Body Bathing: Independently;sitting Pt Will Transfer to Toilet: with modified independence;ambulating Additional ADL Goal #1: Patient will maintain SpO2>90 during standing ADLs. Additional ADL Goal #2: Patient will independently initiate and perform pursed lip breathing during ADLs.  Plan Discharge plan remains appropriate    Co-evaluation                 AM-PAC OT "6 Clicks" Daily Activity     Outcome Measure   Help from another person eating meals?: None Help from another person taking care of personal grooming?: A Little Help from another person toileting, which includes using toliet, bedpan, or urinal?: A Little Help from another person bathing (including washing, rinsing, drying)?: A Little Help from another person to put on and taking off regular upper body clothing?: A Little Help from another person to put on and taking off regular lower body clothing?: A Little 6 Click Score: 19    End of Session Equipment Utilized During Treatment: Rolling walker;Oxygen  OT Visit Diagnosis: Unsteadiness on feet (R26.81);Other abnormalities of gait and mobility (R26.89);Muscle weakness (generalized) (M62.81);Ataxia, unspecified (R27.0);Other symptoms and signs involving the nervous system (R29.898)   Activity Tolerance Patient tolerated treatment well   Patient Left in chair;with call bell/phone within reach   Nurse Communication Mobility status        Time: 0277-4128 OT Time Calculation (min): 31 min  Charges: OT General Charges $OT Visit: 1 Visit OT Treatments $Self Care/Home Management : 8-22 mins $Therapeutic Activity: 8-22 mins  Mauri Brooklyn OTR/L [phone]   Mauri Brooklyn 06/07/2019,  3:16 PM

## 2019-06-07 NOTE — Progress Notes (Signed)
Terri updated on pt status

## 2019-06-08 ENCOUNTER — Inpatient Hospital Stay (HOSPITAL_COMMUNITY): Payer: Medicare Other

## 2019-06-08 DIAGNOSIS — I5031 Acute diastolic (congestive) heart failure: Secondary | ICD-10-CM

## 2019-06-08 LAB — CBC
HCT: 34.3 % — ABNORMAL LOW (ref 39.0–52.0)
Hemoglobin: 11.4 g/dL — ABNORMAL LOW (ref 13.0–17.0)
MCH: 29.8 pg (ref 26.0–34.0)
MCHC: 33.2 g/dL (ref 30.0–36.0)
MCV: 89.8 fL (ref 80.0–100.0)
Platelets: 323 10*3/uL (ref 150–400)
RBC: 3.82 MIL/uL — ABNORMAL LOW (ref 4.22–5.81)
RDW: 13.4 % (ref 11.5–15.5)
WBC: 10.9 10*3/uL — ABNORMAL HIGH (ref 4.0–10.5)
nRBC: 0 % (ref 0.0–0.2)

## 2019-06-08 LAB — BASIC METABOLIC PANEL
Anion gap: 7 (ref 5–15)
BUN: 70 mg/dL — ABNORMAL HIGH (ref 8–23)
CO2: 22 mmol/L (ref 22–32)
Calcium: 8.5 mg/dL — ABNORMAL LOW (ref 8.9–10.3)
Chloride: 109 mmol/L (ref 98–111)
Creatinine, Ser: 1.71 mg/dL — ABNORMAL HIGH (ref 0.61–1.24)
GFR calc Af Amer: 48 mL/min — ABNORMAL LOW (ref >60)
GFR calc non Af Amer: 41 mL/min — ABNORMAL LOW (ref >60)
Glucose, Bld: 213 mg/dL — ABNORMAL HIGH (ref 70–99)
Potassium: 6.2 mmol/L — ABNORMAL HIGH (ref 3.5–5.1)
Sodium: 138 mmol/L (ref 135–145)

## 2019-06-08 LAB — ECHOCARDIOGRAM COMPLETE
Height: 71 in
Weight: 3216.95 oz

## 2019-06-08 LAB — GLUCOSE, CAPILLARY
Glucose-Capillary: 138 mg/dL — ABNORMAL HIGH (ref 70–99)
Glucose-Capillary: 203 mg/dL — ABNORMAL HIGH (ref 70–99)
Glucose-Capillary: 287 mg/dL — ABNORMAL HIGH (ref 70–99)
Glucose-Capillary: 314 mg/dL — ABNORMAL HIGH (ref 70–99)

## 2019-06-08 LAB — C-REACTIVE PROTEIN: CRP: 4.3 mg/dL — ABNORMAL HIGH (ref <1.0)

## 2019-06-08 NOTE — Progress Notes (Signed)
PROGRESS NOTE  Nathan Tyler:[id number] DOB: February 16, 1956 DOA: 05/31/2019 PCP: Celene Squibb, MD  HPI/Recap of past 82 hours: 64 year old male with past medical history of chorea, chronic kidney disease status post renal transplant in 2010 on immunosuppressive medication, chronic hepatitis C, diabetes mellitus and hypertension who tested positive for Covid by his PCP on 2/5 and then was directly admitted on 2/7 when he was noted to be hypoxic.  Following admission, patient was started on remdesivir and IV steroids. Initially patient improved with being able to be weaned to room air CRP was trending downward and he was noting much improvement, but then on morning of 2/10, CRP increased from 3.3 up to 5.2 and patient needing increased oxygen supplementation.  Initially he was placed on a nonrebreather and then weaned down to 6 L nasal cannula.  At that time, he was also noted to be hypoglycemic.  Started on broad-spectrum antibiotics. Number next 2 days, patient initially showed signs of improvement with improvement in oxygenation, but for the last few days, oxygen needs have started to trend back upward.  His CRP is slowly trending downward and course has been complicated by episodes of hypoglycemia and hyperkalemia.  Patient himself feels like breathing is rough for today.  Assessment/Plan: Principal Problem:   Acute respiratory failure with hypoxia (HCC) Active Problems:   HCV infection   COVID-19 virus infection   Essential hypertension   CKD (chronic kidney disease)   Renal transplant, status post   HLD (hyperlipidemia)   H/O insulin dependent diabetes mellitus   Chorea   Overweight (BMI 25.0-29.9)   Hypothyroidism   Acute respiratory failure with hypoxia ([place]) from COVID-19 infection Cannot rule out concurrent pneumonia/heart failure exacerbation(see below) SpO2: 90 % O2 Flow Rate (L/min): 4 L/min FiO2 (%): 100 % Recent Labs    06/06/19 0100 06/07/19 0450 06/08/19 0317  CRP  16.1* 7.9* 4.3*  Remdesivir - completed 06/04/19 Actemra - not given (on immunosuppression medication at baseline) Completed cefepime given questionable bacterial pneumonia CTA negative for PE Continue steroids per protocol 05/09/2019 Continue supportive care, Robitussin, cough suppressants, proning, incentive spirometry, early ambulation. Continue IV Lasix 20 mg every 12h given moderate improvement in respiratory status despite appearing euvolemic -follow creatinine   Heart failure, diastolic dysfunction, grade 1 EF 55 to 60%  Appears euvolemic, continue gentle diuresis given above improvement the setting of COVID-19  Essential hypertension:  Blood pressure stable.,  Borderline low on admission initially  Stage IIIb CKD (chronic kidney disease) and patient with history of renal transplant on immunosuppressive medications:  Baseline creatinine around 1.7-1.8  At baseline, follow closely given Lasix 20 every 12 as above  HLD (hyperlipidemia):  Continue statin.  H/O insulin dependent diabetes mellitus, poorly controlled:  Transient hypoglycemia 06/04/19 resolved  Continue Lantus at 24 units daily, down from 35 previously  Scheduled NovoLog recently discontinued. Poorly controlled at home with A1c at 8.5.  CBGs in last 24 hours have been better  Hyperkalemia: Suspect secondary to continued high-dose IV steroids.  Treating with glucose/insulin following levels.  Stable.  No cardiac changes.  This should improve with Lasix  Chorea, unspecified: Noted, chronic.  At baseline.Recently changed from tetrabenazine to Austedo due toinsurance issues/coverage/prior auth per recent neurology note.  HCV infection: Chronic.    Hypothyroidism: Continue Synthroid.  Code Status: Full code Family Communication: Updated daughter by phone. Disposition Plan: Home hopefully in a few days once able to wean down/off of oxygen and resolution of  symptoms.  Consultants:  None  Procedures:  None  Antimicrobials:  IV cefepime 2/10-2/14  IV Remdisivir 2/7-2/11  DVT prophylaxis: Lovenox   Objective: Vitals:   06/08/19 0023 06/08/19 0407  BP: 136/88 (!) 144/90  Pulse: 81 61  Resp: 16 (!) 26  Temp: (!) 97.4 F (36.3 C) 98 F (36.7 C)  SpO2: 97% 99%    Intake/Output Summary (Last 24 hours) at 06/08/2019 0752 Last data filed at 06/07/2019 1800 Gross per 24 hour  Intake 1500 ml  Output --  Net 1500 ml   Filed Weights   05/31/19 1131  Weight: 91.2 kg   Body mass index is 28.04 kg/m.  Exam:   General: Alert and oriented x3, no acute distress  HEENT: Normocephalic and atraumatic, mucous brains are moist  Neck: Supple, no JVD  Cardiovascular: Regular rate and rhythm, S1-S2  Respiratory: Clear to auscultation bilaterally  Abdomen: Soft, nontender, nondistended, positive bowel sounds  Musculoskeletal: No clubbing or cyanosis or edema  Skin: No skin breaks, tears or lesions  Psychiatry: Appropriate, no evidence of psychoses   Data Reviewed: CBC: Recent Labs  Lab 06/02/19 0428 06/02/19 0428 06/03/19 0601 06/03/19 0601 06/04/19 0300 06/05/19 0207 06/06/19 0100 06/07/19 0450 06/08/19 0317  WBC 14.5*   < > 14.3*   < > 10.8* 17.5* 10.5 9.7 10.9*  NEUTROABS 11.1*  --  11.4*  --  8.7* 13.6*  --   --   --   HGB 14.0   < > 12.8*   < > 12.1* 12.1* 11.3* 11.8* 11.4*  HCT 41.4   < > 38.3*   < > 36.8* 36.2* 34.4* 35.6* 34.3*  MCV 89.2   < > 88.7   < > 91.1 89.2 90.3 89.2 89.8  PLT 427*   < > 399   < > 341 354 307 328 323   < > = values in this interval not displayed.   Basic Metabolic Panel: Recent Labs  Lab 06/02/19 0428 06/02/19 0428 06/03/19 0601 06/03/19 0601 06/04/19 0300 06/04/19 0300 06/05/19 0207 06/05/19 0207 06/06/19 0100 06/06/19 0910 06/06/19 1540 06/07/19 0450 06/08/19 0317  NA 138   < > 137   < > 136  --  137  --  134*  --   --  135 138  K 5.3*   < > 5.3*   < > 5.8*   <  > 5.5*   < > 6.6* 5.9* 6.4* 6.8* 6.2*  CL 112*   < > 114*   < > 113*  --  114*  --  112*  --   --  111 109  CO2 17*   < > 16*   < > 16*  --  16*  --  15*  --   --  18* 22  GLUCOSE 64*   < > 58*   < > 177*  --  44*  --  215*  --   --  187* 213*  BUN 81*   < > 79*   < > 73*  --  62*  --  64*  --   --  64* 70*  CREATININE 1.92*   < > 1.80*   < > 1.82*  --  1.70*  --  1.62*  --   --  1.54* 1.71*  CALCIUM 8.8*   < > 8.5*   < > 8.3*  --  8.4*  --  8.3*  --   --  8.5* 8.5*  MG 2.7*  --  2.3  --  2.4  --  2.3  --   --   --   --   --   --    < > = values in this interval not displayed.   GFR: Estimated Creatinine Clearance: 49.8 mL/min (A) (by C-G formula based on SCr of 1.71 mg/dL (H)). Liver Function Tests: Recent Labs  Lab 06/02/19 0428 06/03/19 0601 06/04/19 0300 06/05/19 0207  AST 25 24 21 23   ALT 19 17 17 20   ALKPHOS 71 71 65 71  BILITOT 0.7 0.5 0.5 0.7  PROT 5.6* 5.5* 5.0* 5.0*  ALBUMIN 2.7* 2.6* 2.4* 2.2*   No results for input(s): LIPASE, AMYLASE in the last 168 hours. No results for input(s): AMMONIA in the last 168 hours. Coagulation Profile: No results for input(s): INR, PROTIME in the last 168 hours. Cardiac Enzymes: No results for input(s): CKTOTAL, CKMB, CKMBINDEX, TROPONINI in the last 168 hours. BNP (last 3 results) No results for input(s): PROBNP in the last 8760 hours. HbA1C: No results for input(s): HGBA1C in the last 72 hours. CBG: Recent Labs  Lab 06/06/19 2001 06/07/19 0803 06/07/19 1134 06/07/19 1604 06/07/19 1913  GLUCAP 261* 147* 198* 269* 289*   Lipid Profile: No results for input(s): CHOL, HDL, LDLCALC, TRIG, CHOLHDL, LDLDIRECT in the last 72 hours. Thyroid Function Tests: No results for input(s): TSH, T4TOTAL, FREET4, T3FREE, THYROIDAB in the last 72 hours. Anemia Panel: No results for input(s): VITAMINB12, FOLATE, FERRITIN, TIBC, IRON, RETICCTPCT in the last 72 hours. Urine analysis: No results found for: COLORURINE, APPEARANCEUR, LABSPEC,  PHURINE, GLUCOSEU, HGBUR, BILIRUBINUR, KETONESUR, PROTEINUR, UROBILINOGEN, NITRITE, LEUKOCYTESUR  Studies: No results found.  Scheduled Meds: . vitamin C  500 mg Oral Daily  . atorvastatin  10 mg Oral q1800  . Deutetrabenazine  6 mg Oral BID  . dexamethasone (DECADRON) injection  6 mg Intravenous Q24H  . enoxaparin (LOVENOX) injection  40 mg Subcutaneous Q24H  . furosemide  20 mg Intravenous Q12H  . insulin aspart  0-4 Units Subcutaneous QHS  . insulin aspart  0-9 Units Subcutaneous TID WC  . insulin glargine  24 Units Subcutaneous Daily  . Ipratropium-Albuterol  1 puff Inhalation Q6H  . levothyroxine  100 mcg Oral QAC breakfast  . linagliptin  5 mg Oral Daily  . tacrolimus  1.5 mg Oral BID  . zinc sulfate  220 mg Oral Daily    Continuous Infusions:    LOS: 8 days     Little Ishikawa, DO [facility]   06/08/2019, 7:52 AM

## 2019-06-08 NOTE — Progress Notes (Signed)
1200Spoke to pt daughter Lyndee Leo.  Notified her that pt continues to need 4L at rest and 6L with exertion of Oxygen.  No discharge today planned I explained I would call her back with any changes

## 2019-06-08 NOTE — Progress Notes (Signed)
Attempted to call Sig Other Terri  Unable to leave a message.  Phone rang several times No answer

## 2019-06-08 NOTE — Progress Notes (Signed)
PT Cancellation Note  Patient Details Name: Nathan Tyler MRN: [id number] DOB: Feb 15, 1956   Cancelled Treatment:    Reason Eval/Treat Not Completed: Other (comment)(Not available x 2 for PT session. Will reschedule and continue to monitor) Rollen Sox, PT # [phone] CGV cell  Casandra Doffing 06/08/2019, 4:53 PM

## 2019-06-08 NOTE — Progress Notes (Signed)
  Echocardiogram 2D Echocardiogram has been performed.  Nathan Tyler 06/08/2019, 10:03 AM

## 2019-06-08 NOTE — Progress Notes (Signed)
Pt had a pretty good day Pt slows down while getting up Pt started out by trying to jump out of bed full speed wires attached I educated pt on the need to pace himself and take deep breaths with exertion. Pt on 4L occasionally he required abit more today but toward end of shift pt up to [facility] with 4L.

## 2019-06-08 NOTE — TOC Initial Note (Signed)
Transition of Care [facility]) - Initial/Assessment Note    Patient Details  Name: Nathan Tyler MRN: [id number] Date of Birth: 1955-12-18  Transition of Care [facility]) CM/SW Contact:    Ross Ludwig, LCSW Phone Number: 06/08/2019, 3:13 PM  Clinical Narrative:                  Patient is a 65 year old male who is alert and oriented x4.  Patient lives with his significant other Karna Christmas, [place] attempted to speak to patient in his room, however he did not hear phone.  CSW attempted to call his significant other, unable to leave a message, CSW then tried to contact patient's daughter Lyndee Leo, and was able to speak to her.  Patient has not had home health before, CSW discussed choices with patient's daughter, she did not have a preference, CSW contacted [place] and they agreed to accept patient once he is medically ready for discharge.  CSW to continue to follow patient's progress throughout discharge planning.  Expected Discharge Plan: [place] Barriers to Discharge: Continued Medical Work up   Patient Goals and CMS Choice Patient states their goals for this hospitalization and ongoing recovery are:: Patient plans to go home with home health services. CMS Medicare.gov Compare Post Acute Care list provided to:: Patient Represenative (must comment) Choice offered to / list presented to : Adult Children  Expected Discharge Plan and Services Expected Discharge Plan: [place] In-house Referral: Clinical Social Work   Post Acute Care Choice: [place] arrangements for the past 2 months: [place]: PT [place]: [place] Date [place]: 06/08/19 Time HH Agency Contacted: 73 Representative spoke with at [place]: Georgina Snell  Prior Living Arrangements/Services Living arrangements for the past 2 months: [place] Lives with:: Significant Other Patient language and need for  interpreter reviewed:: Yes Do you feel safe going back to the place where you live?: Yes      Need for Family Participation in Patient Care: Yes (Comment) Care giver support system in place?: No (comment)   Criminal Activity/Legal Involvement Pertinent to Current Situation/Hospitalization: No - Comment as needed  Activities of Daily Living Home Assistive Devices/Equipment: None ADL Screening (condition at time of admission) Patient's cognitive ability adequate to safely complete daily activities?: Yes Is the patient deaf or have difficulty hearing?: No Does the patient have difficulty seeing, even when wearing glasses/contacts?: No Does the patient have difficulty concentrating, remembering, or making decisions?: No Patient able to express need for assistance with ADLs?: Yes Does the patient have difficulty dressing or bathing?: No Independently performs ADLs?: Yes (appropriate for developmental age) Does the patient have difficulty walking or climbing stairs?: No Weakness of Legs: None Weakness of Arms/Hands: None  Permission Sought/Granted Permission sought to share information with : Family Supports Permission granted to share information with : Yes, Release of Information Signed  Share Information with NAME: Moss-Post,Terri Significant other [phone]  [phone] or Dangerfield,Claire Daughter [phone]  Permission granted to share info w AGENCY: Home Health Agency        Emotional Assessment Appearance:: Appears stated age   Affect (typically observed): Accepting, Calm, Stable Orientation: : Oriented to Place, Oriented to  Time, Oriented to Situation, Oriented to Self Alcohol / Substance Use: Not Applicable Psych Involvement:  No (comment)  Admission diagnosis:  COVID-19 virus infection [U07.1] Patient Active Problem List   Diagnosis Date Noted  . Overweight (BMI 25.0-29.9) 06/03/2019  . Acute respiratory failure with hypoxia ([place]) 06/03/2019  . Hypothyroidism    . COVID-19 virus infection 05/31/2019  . Essential hypertension 05/31/2019  . CKD (chronic kidney disease) 05/31/2019  . Renal transplant, status post 05/31/2019  . HLD (hyperlipidemia) 05/31/2019  . H/O insulin dependent diabetes mellitus 05/31/2019  . Chorea 05/31/2019  . Diarrhea 09/02/2018  . History of colonic polyps 10/25/2017  . HCV infection 10/25/2017  . Hepatitis C antibody positive in blood 03/12/2017  . Elevated LFTs 03/12/2017   PCP:  Celene Squibb, MD Pharmacy:   [facility] [place] [address] Phone: [phone] Fax: [phone]  [id number] [place], [place] [address] [place] [address] Phone: [phone] Fax: [phone]     Social Determinants of Health (SDOH) Interventions    Readmission Risk Interventions No flowsheet data found.

## 2019-06-08 NOTE — TOC Progression Note (Signed)
Transition of Care [facility]) - Progression Note    Patient Details  Name: Nathan Tyler MRN: [id number] Date of Birth: 1955/06/15  Transition of Care [facility]) CM/SW Contact  Nathan Tyler, [place] Phone Number: 06/08/2019, 3:03 PM  Clinical Narrative:     CSW spoke to patient's daughter Nathan Tyler to inform her that CSW was going to set up home health for patient.  CSW attempted to contact patient's significant other, however her voice mail was full, CSW unable to leave a message.  Per patient's daughter, they have never had home health before, and she did not have a preference for an agency.  CSW made referral to [place], and spoke to [place], he will accept patient for home health PT.  CSW was informed patient will probably need oxygen set up, patient will have to qualify.   Expected Discharge Plan: [place] Barriers to Discharge: Continued Medical Work up  Expected Discharge Plan and Services Expected Discharge Plan: [place] In-house Referral: Clinical Social Work   Post Acute Care Choice: [place] arrangements for the past 2 months: [place]                 DME Arranged: Oxygen                     Social Determinants of Health (SDOH) Interventions    Readmission Risk Interventions No flowsheet data found.

## 2019-06-09 LAB — COMPREHENSIVE METABOLIC PANEL
ALT: 16 U/L (ref 0–44)
AST: 12 U/L — ABNORMAL LOW (ref 15–41)
Albumin: 2.4 g/dL — ABNORMAL LOW (ref 3.5–5.0)
Alkaline Phosphatase: 85 U/L (ref 38–126)
Anion gap: 9 (ref 5–15)
BUN: 79 mg/dL — ABNORMAL HIGH (ref 8–23)
CO2: 19 mmol/L — ABNORMAL LOW (ref 22–32)
Calcium: 8.3 mg/dL — ABNORMAL LOW (ref 8.9–10.3)
Chloride: 107 mmol/L (ref 98–111)
Creatinine, Ser: 1.85 mg/dL — ABNORMAL HIGH (ref 0.61–1.24)
GFR calc Af Amer: 43 mL/min — ABNORMAL LOW (ref >60)
GFR calc non Af Amer: 37 mL/min — ABNORMAL LOW (ref >60)
Glucose, Bld: 180 mg/dL — ABNORMAL HIGH (ref 70–99)
Potassium: 4.9 mmol/L (ref 3.5–5.1)
Sodium: 135 mmol/L (ref 135–145)
Total Bilirubin: 0.4 mg/dL (ref 0.3–1.2)
Total Protein: 5.2 g/dL — ABNORMAL LOW (ref 6.5–8.1)

## 2019-06-09 LAB — GLUCOSE, CAPILLARY
Glucose-Capillary: 156 mg/dL — ABNORMAL HIGH (ref 70–99)
Glucose-Capillary: 218 mg/dL — ABNORMAL HIGH (ref 70–99)
Glucose-Capillary: 221 mg/dL — ABNORMAL HIGH (ref 70–99)
Glucose-Capillary: 286 mg/dL — ABNORMAL HIGH (ref 70–99)

## 2019-06-09 LAB — C-REACTIVE PROTEIN: CRP: 2.4 mg/dL — ABNORMAL HIGH (ref <1.0)

## 2019-06-09 MED ORDER — TACROLIMUS 1 MG PO CAPS
1.5000 mg | ORAL_CAPSULE | Freq: Two times a day (BID) | ORAL | Status: DC
  Administered 2019-06-11 – 2019-06-16 (×11): 1.5 mg via ORAL
  Filled 2019-06-09 (×12): qty 1

## 2019-06-09 NOTE — Progress Notes (Signed)
PROGRESS NOTE  Nathan Tyler XTG:[id number] DOB: 04-30-1955 DOA: 05/31/2019 PCP: Celene Squibb, MD  HPI: 64 year old male with past medical history of chorea, chronic kidney disease status post renal transplant in 2010 on immunosuppressive medication, chronic hepatitis C, diabetes mellitus and hypertension who tested positive for Covid by his PCP on 2/5 and then was directly admitted on 2/7 when he was noted to be hypoxic.  Following admission, patient was started on remdesivir and IV steroids. Initially patient improved with being able to be weaned to room air CRP was trending downward and he was noting much improvement, but then on morning of 2/10, CRP increased from 3.3 up to 5.2 and patient needing increased oxygen supplementation.  Initially he was placed on a nonrebreather and then weaned down to 6 L nasal cannula.  At that time, he was also noted to be hypoglycemic.  Started on broad-spectrum antibiotics. Number next 2 days, patient initially showed signs of improvement with improvement in oxygenation, but for the last few days, oxygen needs have started to trend back upward.  His CRP is slowly trending downward and course has been complicated by episodes of hypoglycemia and hyperkalemia.  Patient himself feels like breathing is rough for today.  Subjective: No acute issues or events overnight, respiratory status somewhat improved since yesterday but dyspnea with exertion ongoing.  Declines chest pain, nausea, vomiting, diarrhea, constipation, headache, fevers, chills.  Assessment/Plan: Principal Problem:   Acute respiratory failure with hypoxia (HCC) Active Problems:   HCV infection   COVID-19 virus infection   Essential hypertension   CKD (chronic kidney disease)   Renal transplant, status post   HLD (hyperlipidemia)   H/O insulin dependent diabetes mellitus   Chorea   Overweight (BMI 25.0-29.9)   Hypothyroidism   Acute respiratory failure with hypoxia ([name]) from COVID-19  infection Cannot rule out concurrent pneumonia/heart failure exacerbation(see below) SpO2: 96 % O2 Flow Rate (L/min): 4 L/min FiO2 (%): 100 % Recent Labs    06/07/19 0450 06/08/19 0317 06/09/19 0415  CRP 7.9* 4.3* 2.4*  Remdesivir - completed 06/04/19 Actemra - not given (on immunosuppression medication at baseline) Completed cefepime given questionable bacterial pneumonia CTA negative for PE 2/10 Continue steroids per protocol 05/09/2019 Continue supportive care, Robitussin, cough suppressants, proning, incentive spirometry, early ambulation. Continue IV Lasix 20 mg every 12h given moderate improvement in respiratory status despite appearing euvolemic(likely pulmonary edema 2/2 covid) - follow creatinine as below  Heart failure, diastolic dysfunction, grade 1 EF 55 to 60%  Appears euvolemic, continue gentle diuresis given above improvement the setting of COVID-19  Essential hypertension:  Blood pressure stable.,  Borderline low on admission initially  Stage IIIb CKD (chronic kidney disease) and patient with history of renal transplant on immunosuppressive medications:  Baseline creatinine around 1.7-1.8  At baseline, follow closely given Lasix 20mg  every 12h as above  HLD (hyperlipidemia):  Continue statin.  H/O insulin dependent diabetes mellitus, poorly controlled:  Transient hypoglycemia 06/04/19 resolved  Continue Lantus at 24 units daily, down from 35 previously  Scheduled NovoLog recently discontinued. Poorly controlled at home with A1c at 8.5.  CBGs in last 24 hours have been better  Hyperkalemia: Suspect secondary to continued high-dose IV steroids.  Treating with glucose/insulin following levels.  Stable.  No cardiac changes.  This should improve with Lasix  Chorea, unspecified: Noted, chronic.  At baseline.Recently changed from tetrabenazine to Austedo due toinsurance issues/coverage/prior auth per recent neurology note.  HCV infection: Chronic.    Hypothyroidism:  Continue Synthroid.  Code Status: Full code Family Communication: Updated daughter by phone. Disposition Plan: Home hopefully in 24-48h once able to wean down/off of oxygen and resolution of symptoms.  Consultants:  None  Procedures:  None  Antimicrobials:  IV cefepime 2/10-2/14  IV Remdisivir 2/7-2/11  DVT prophylaxis: Lovenox   Objective: Vitals:   06/09/19 0746 06/09/19 1146  BP: (!) 150/97 (!) 133/99  Pulse: 78 90  Resp: (!) 25 (!) 24  Temp: 98.1 F (36.7 C) 98 F (36.7 C)  SpO2: 93% 96%    Intake/Output Summary (Last 24 hours) at 06/09/2019 1503 Last data filed at 06/09/2019 1000 Gross per 24 hour  Intake 600 ml  Output --  Net 600 ml   Filed Weights   05/31/19 1131  Weight: 91.2 kg   Body mass index is 28.04 kg/m.  Exam:   General: Alert and oriented x3, no acute distress  HEENT: Normocephalic and atraumatic, mucous brains are moist  Neck: Supple, no JVD  Cardiovascular: Regular rate and rhythm, S1-S2  Respiratory: Clear to auscultation bilaterally  Abdomen: Soft, nontender, nondistended, positive bowel sounds  Musculoskeletal: No clubbing or cyanosis or edema  Skin: No skin breaks, tears or lesions  Psychiatry: Appropriate, no evidence of psychoses   Data Reviewed: CBC: Recent Labs  Lab 06/03/19 0601 06/03/19 0601 06/04/19 0300 06/05/19 0207 06/06/19 0100 06/07/19 0450 06/08/19 0317  WBC 14.3*   < > 10.8* 17.5* 10.5 9.7 10.9*  NEUTROABS 11.4*  --  8.7* 13.6*  --   --   --   HGB 12.8*   < > 12.1* 12.1* 11.3* 11.8* 11.4*  HCT 38.3*   < > 36.8* 36.2* 34.4* 35.6* 34.3*  MCV 88.7   < > 91.1 89.2 90.3 89.2 89.8  PLT 399   < > 341 354 307 328 323   < > = values in this interval not displayed.   Basic Metabolic Panel: Recent Labs  Lab 06/03/19 0601 06/03/19 0601 06/04/19 0300 06/04/19 0300 06/05/19 0207 06/05/19 0207 06/06/19 0100 06/06/19 0100 06/06/19 0910 06/06/19 1540 06/07/19 0450 06/08/19 0317 06/09/19 0415   NA 137   < > 136   < > 137  --  134*  --   --   --  135 138 135  K 5.3*   < > 5.8*   < > 5.5*   < > 6.6*   < > 5.9* 6.4* 6.8* 6.2* 4.9  CL 114*   < > 113*   < > 114*  --  112*  --   --   --  111 109 107  CO2 16*   < > 16*   < > 16*  --  15*  --   --   --  18* 22 19*  GLUCOSE 58*   < > 177*   < > 44*  --  215*  --   --   --  187* 213* 180*  BUN 79*   < > 73*   < > 62*  --  64*  --   --   --  64* 70* 79*  CREATININE 1.80*   < > 1.82*   < > 1.70*  --  1.62*  --   --   --  1.54* 1.71* 1.85*  CALCIUM 8.5*   < > 8.3*   < > 8.4*  --  8.3*  --   --   --  8.5* 8.5* 8.3*  MG 2.3  --  2.4  --  2.3  --   --   --   --   --   --   --   --    < > =  values in this interval not displayed.   GFR: Estimated Creatinine Clearance: 46 mL/min (A) (by C-G formula based on SCr of 1.85 mg/dL (H)).   Liver Function Tests: Recent Labs  Lab 06/03/19 0601 06/04/19 0300 06/05/19 0207 06/09/19 0415  AST 24 21 23  12*  ALT 17 17 20 16   ALKPHOS 71 65 71 85  BILITOT 0.5 0.5 0.7 0.4  PROT 5.5* 5.0* 5.0* 5.2*  ALBUMIN 2.6* 2.4* 2.2* 2.4*    CBG: Recent Labs  Lab 06/08/19 1135 06/08/19 1709 06/08/19 2035 06/09/19 0715 06/09/19 1213  GLUCAP 203* 287* 314* 156* 218*    Studies: No results found.  Scheduled Meds: . vitamin C  500 mg Oral Daily  . atorvastatin  10 mg Oral q1800  . Deutetrabenazine  6 mg Oral BID  . enoxaparin (LOVENOX) injection  40 mg Subcutaneous Q24H  . furosemide  20 mg Intravenous Q12H  . insulin aspart  0-4 Units Subcutaneous QHS  . insulin aspart  0-9 Units Subcutaneous TID WC  . insulin glargine  24 Units Subcutaneous Daily  . Ipratropium-Albuterol  1 puff Inhalation Q6H  . levothyroxine  100 mcg Oral QAC breakfast  . linagliptin  5 mg Oral Daily  . [START ON 06/11/2019] tacrolimus  1.5 mg Oral BID  . zinc sulfate  220 mg Oral Daily    Continuous Infusions:   LOS: 9 days   Little Ishikawa, DO [facility]   06/09/2019, 3:03 PM

## 2019-06-09 NOTE — Progress Notes (Signed)
Occupational Therapy Treatment Patient Details Name: Nathan Tyler MRN: [id number] DOB: [birth year] Today's Date: 06/09/2019    History of present illness Pt is a 64 y.o. male directly admitted from [place] infusion clinic on 05/31/19 with progressive SOB, found to be hypoxic; pt tested (+) COVID-19 on 2/5. PMH includes neuromuscular disorder, chorea, DM, CKD (s/p renal transplant 2010), R foot 5th and L 2nd toe amputations.   OT comments  Patient showing improvement since last visit with activity tolerance and pursed lip breathing.  Patient still requires cueing for breathing but was more consistent than previous and is aware he needs to focus on and practice it.  Patient able to transfer to [facility] with min guard and complete standing grooming with min guard.  Still impulsive, discussed safety and energy conservation methods for when he returns home.  Stood for ~86min.  Patient on 4L O2 at rest with SpO2 90-95, and with prolonged stand patient desat to 79, requiring 10L to maintain SpO2 89.  After seated rest patient quickly recovered and O2 lowered back to 4L.  Completed flutter valve and incentive spirometer exercises (pulled 750), which is also improving.  Will continue to follow with OT acutely to address the deficits listed below and ensure a safe discharge home.    Follow Up Recommendations  Home health OT;Supervision/Assistance - 24 hour    Equipment Recommendations  3 in 1 bedside commode    Recommendations for Other Services      Precautions / Restrictions Precautions Precautions: Fall Precaution Comments: chorea, monitor SpO2 Restrictions Weight Bearing Restrictions: No       Mobility Bed Mobility Overal bed mobility: Needs Assistance Bed Mobility: Supine to Sit     Supine to sit: Supervision     Transfers Overall transfer level: Needs assistance Equipment used: None Transfers: Sit to/from Stand Sit to Stand: Min guard              Balance Overall balance assessment:  Needs assistance Sitting-balance support: Feet supported;Bilateral upper extremity supported Sitting balance-Leahy Scale: Fair Sitting balance - Comments: Chorea movements evident while sitting edge of bed   Standing balance support: During functional activity Standing balance-Leahy Scale: Fair                             ADL either performed or assessed with clinical judgement   ADL   Eating/Feeding: Set up;Sitting   Grooming: Wash/dry hands;Wash/dry face;Oral care;Supervision/safety;Standing                   Toilet Transfer: Min guard;BSC   Toileting- Water quality scientist and Hygiene: Supervision/safety;Sit to/from stand       Functional mobility during ADLs: Passenger transport manager     Praxis      Cognition Arousal/Alertness: Awake/alert Behavior During Therapy: WFL for tasks assessed/performed Overall Cognitive Status: Within Functional Limits for tasks assessed                                 General Comments: Pleasant but impulsive, does not like using walker        Exercises Exercises: Other exercises Other Exercises Other Exercises: x10 flutter valve Other Exercises: x10 incentive spirometer Other Exercises: pursed lip breathing   Shoulder Instructions       General Comments      Pertinent  Vitals/ Pain       Pain Assessment: No/denies pain  Home Living                                          Prior Functioning/Environment              Frequency  Min 3X/week        Progress Toward Goals  OT Goals(current goals can now be found in the care plan section)  Progress towards OT goals: Progressing toward goals  Acute Rehab OT Goals Patient Stated Goal: Get well and return home OT Goal Formulation: With patient Time For Goal Achievement: 06/16/19 Potential to Achieve Goals: Good  Plan Discharge plan remains appropriate    Co-evaluation                  AM-PAC OT "6 Clicks" Daily Activity     Outcome Measure   Help from another person eating meals?: None Help from another person taking care of personal grooming?: A Little Help from another person toileting, which includes using toliet, bedpan, or urinal?: A Little Help from another person bathing (including washing, rinsing, drying)?: A Little Help from another person to put on and taking off regular upper body clothing?: A Little Help from another person to put on and taking off regular lower body clothing?: A Little 6 Click Score: 19    End of Session Equipment Utilized During Treatment: Rolling walker;Oxygen  OT Visit Diagnosis: Unsteadiness on feet (R26.81);Other abnormalities of gait and mobility (R26.89);Muscle weakness (generalized) (M62.81);Ataxia, unspecified (R27.0);Other symptoms and signs involving the nervous system (R29.898)   Activity Tolerance Patient tolerated treatment well   Patient Left in chair;with call bell/phone within reach;with chair alarm set   Nurse Communication Mobility status        Time: 7482-7078 OT Time Calculation (min): 29 min  Charges: OT General Charges $OT Visit: 1 Visit OT Treatments $Self Care/Home Management : 23-37 mins  [name], OTR/L    Phylliss Bob 06/09/2019, 2:14 PM

## 2019-06-09 NOTE — Progress Notes (Signed)
Pt asymptomatic, but I noticed this afternoon that pt lower jawline bilaterally up to his cheeks looked abit swollen.  Pt neck also looked abit swollen.  I gave report tonite and had the nurse that had pt last night take a look at him as she was receiving him again tonight.  Pt in good spirits, denies any SOB

## 2019-06-09 NOTE — Progress Notes (Addendum)
RN spoke with pharmacy regarding the hold orders for pt's Prograf as there was nothing written in the most recent progress note about holding said medication. Pharmacy stated that the doctor is holding the medication until 06/11/19 because pt's tacrolimus levels came back high. Pt heard the conversation between the RN and pharmacy and understands the rationale for holding his medication as well.

## 2019-06-09 NOTE — Progress Notes (Signed)
Dr. Bridgett Larsson notified of pt's bilateral facial edema. MD informed that pt is not experiencing any breathing or swallowing issues at this time and that pt is on steroids. Dr. Bridgett Larsson stated, "keep me updated if his swelling gets any worse or if he starts having trouble breathing." RN will continue to monitor.

## 2019-06-09 NOTE — Plan of Care (Signed)

## 2019-06-09 NOTE — Progress Notes (Signed)
Physical Therapy Treatment Patient Details Name: Nathan Tyler MRN: [id number] DOB: Jan 27, 1956 Today's Date: 06/09/2019    History of Present Illness Pt is a 64 y.o. male directly admitted from [place] infusion clinic on 05/31/19 with progressive SOB, found to be hypoxic; pt tested (+) COVID-19 on 2/5. PMH includes neuromuscular disorder, chorea, DM, CKD (s/p renal transplant 2010), R foot 5th and L 2nd toe amputations.    PT Comments    Pt has great difficulty with pursed lip breathing, spent considerable time working on this and still pt has great difficulty with getting air and maintaining sats in therapeutic range. Completed supine to sit and pt desat to 70s, sitting edge of bed attempted to work on pursed lip breathing to increase saturations but pt was not able to, even with use of flutter valve, verbal and visual cues pt still in 70s-low 80s on 2L/min via HFNC. Therapist increased 02 to 3L/ min then 4L/min but sats remained max 82%. Pt removed from HFNC and placed on NRB at 10L and was able to increase sats to 89%. Through this probe was moved from right ear to left and to left finger with same readings. Pt did not appear to be symptomatic but has a lot of movements which may be part of reason for poor readings. Pt was able to stand with min guard assist and also ambulate 2 x 55ft with min guard assist. Pt has been instructed to work on pursed lip breathing and also use of incentive spirometer and also flutter valve during down times. At end of session pt was back in bed on 4L/min via HFNC and sats in low 90s.     Follow Up Recommendations  Home health PT;Supervision/Assistance - 24 hour     Equipment Recommendations  Rolling walker with 5" wheels    Recommendations for Other Services       Precautions / Restrictions Precautions Precautions: Fall Precaution Comments: chorea, monitor SpO2 Restrictions Weight Bearing Restrictions: No    Mobility  Bed Mobility Overal bed mobility: Needs  Assistance Bed Mobility: Supine to Sit;Sit to Supine     Supine to sit: Supervision Sit to supine: Supervision   General bed mobility comments: needs line management assist and also cues for safety, pt still quite impulsive  Transfers Overall transfer level: Needs assistance Equipment used: None Transfers: Sit to/from Stand Sit to Stand: Min guard            Ambulation/Gait Ambulation/Gait assistance: Min guard Gait Distance (Feet): 48 Feet Assistive device: None(states does not use AD and is to dc soon) Gait Pattern/deviations: Ataxic;Step-through pattern(trunk hyperextended) Gait velocity: decreased       Stairs             Wheelchair Mobility    Modified Rankin (Stroke Patients Only)       Balance Overall balance assessment: Needs assistance Sitting-balance support: Feet supported;Bilateral upper extremity supported Sitting balance-Leahy Scale: Fair Sitting balance - Comments: Chorea movements evident while sitting edge of bed   Standing balance support: During functional activity Standing balance-Leahy Scale: Fair                              Cognition Arousal/Alertness: Awake/alert Behavior During Therapy: WFL for tasks assessed/performed Overall Cognitive Status: Within Functional Limits for tasks assessed  Exercises Other Exercises Other Exercises: flutter valve use to  increase 02 sats    General Comments        Pertinent Vitals/Pain Pain Assessment: No/denies pain    Home Living                      Prior Function            PT Goals (current goals can now be found in the care plan section) Acute Rehab PT Goals Patient Stated Goal: did not voice goals PT Goal Formulation: With patient Time For Goal Achievement: 06/16/19 Potential to Achieve Goals: Good Progress towards PT goals: Progressing toward goals    Frequency    Min 3X/week      PT  Plan Current plan remains appropriate    Co-evaluation              AM-PAC PT "6 Clicks" Mobility   Outcome Measure  Help needed turning from your back to your side while in a flat bed without using bedrails?: None Help needed moving from lying on your back to sitting on the side of a flat bed without using bedrails?: A Little Help needed moving to and from a bed to a chair (including a wheelchair)?: A Little Help needed standing up from a chair using your arms (e.g., wheelchair or bedside chair)?: None Help needed to walk in hospital room?: A Little Help needed climbing 3-5 steps with a railing? : A Lot 6 Click Score: 19    End of Session Equipment Utilized During Treatment: Oxygen Activity Tolerance: Patient limited by fatigue;Patient limited by lethargy;Treatment limited secondary to medical complications (Comment) Patient left: in bed;with call bell/phone within reach;with bed alarm set Nurse Communication: Mobility status PT Visit Diagnosis: Unsteadiness on feet (R26.81);Other abnormalities of gait and mobility (R26.89);Muscle weakness (generalized) (M62.81)     Time: 5670-1410 PT Time Calculation (min) (ACUTE ONLY): 33 min  Charges:  $Gait Training: 8-22 mins $Therapeutic Activity: 8-22 mins                     Horald Chestnut, PT    Delford Field 06/09/2019, 1:02 PM

## 2019-06-10 DIAGNOSIS — Z8639 Personal history of other endocrine, nutritional and metabolic disease: Secondary | ICD-10-CM

## 2019-06-10 DIAGNOSIS — Z940 Kidney transplant status: Secondary | ICD-10-CM

## 2019-06-10 DIAGNOSIS — E039 Hypothyroidism, unspecified: Secondary | ICD-10-CM

## 2019-06-10 DIAGNOSIS — E785 Hyperlipidemia, unspecified: Secondary | ICD-10-CM

## 2019-06-10 DIAGNOSIS — B182 Chronic viral hepatitis C: Secondary | ICD-10-CM

## 2019-06-10 LAB — COMPREHENSIVE METABOLIC PANEL
ALT: 17 U/L (ref 0–44)
AST: 15 U/L (ref 15–41)
Albumin: 2.5 g/dL — ABNORMAL LOW (ref 3.5–5.0)
Alkaline Phosphatase: 78 U/L (ref 38–126)
Anion gap: 9 (ref 5–15)
BUN: 83 mg/dL — ABNORMAL HIGH (ref 8–23)
CO2: 19 mmol/L — ABNORMAL LOW (ref 22–32)
Calcium: 8.5 mg/dL — ABNORMAL LOW (ref 8.9–10.3)
Chloride: 107 mmol/L (ref 98–111)
Creatinine, Ser: 1.61 mg/dL — ABNORMAL HIGH (ref 0.61–1.24)
GFR calc Af Amer: 51 mL/min — ABNORMAL LOW (ref >60)
GFR calc non Af Amer: 44 mL/min — ABNORMAL LOW (ref >60)
Glucose, Bld: 191 mg/dL — ABNORMAL HIGH (ref 70–99)
Potassium: 5.4 mmol/L — ABNORMAL HIGH (ref 3.5–5.1)
Sodium: 135 mmol/L (ref 135–145)
Total Bilirubin: 0.7 mg/dL (ref 0.3–1.2)
Total Protein: 5 g/dL — ABNORMAL LOW (ref 6.5–8.1)

## 2019-06-10 LAB — GLUCOSE, CAPILLARY
Glucose-Capillary: 118 mg/dL — ABNORMAL HIGH (ref 70–99)
Glucose-Capillary: 159 mg/dL — ABNORMAL HIGH (ref 70–99)
Glucose-Capillary: 75 mg/dL (ref 70–99)
Glucose-Capillary: 185 mg/dL — ABNORMAL HIGH (ref 70–99)

## 2019-06-10 LAB — C-REACTIVE PROTEIN: CRP: 1.3 mg/dL — ABNORMAL HIGH (ref <1.0)

## 2019-06-10 MED ORDER — DEXAMETHASONE SODIUM PHOSPHATE 4 MG/ML IJ SOLN
4.0000 mg | Freq: Every day | INTRAMUSCULAR | Status: DC
  Administered 2019-06-10 – 2019-06-11 (×2): 4 mg via INTRAVENOUS
  Filled 2019-06-10 (×2): qty 1

## 2019-06-10 MED ORDER — SODIUM ZIRCONIUM CYCLOSILICATE 5 G PO PACK
5.0000 g | PACK | Freq: Two times a day (BID) | ORAL | Status: DC
  Administered 2019-06-10 – 2019-06-11 (×3): 5 g via ORAL
  Filled 2019-06-10 (×3): qty 1

## 2019-06-10 NOTE — Progress Notes (Signed)
PROGRESS NOTE  Nathan Tyler  Nathan Tyler:[id number] DOB: [birth year] DOA: 05/31/2019 PCP: Nathan Squibb, MD   Brief Narrative: Nathan Tyler is a 64 y.o. male with a history of renal transplant, CKD, chronic hepatitis C, IDT2DM, HTN, hypothyroidism, and chorea who was diagnosed with covid-19 by his PCP on 2/5 and directly admitted to [facility] on 2/7 due to hypoxemia. Remdesivir and IV steroids were given with initial improvement followed by abrupt worsening on 2/10 for which antibiotics were started. Hypoxemia has continued with evidence of pulmonary edema on CXR, diuresis has been started and is ongoing.    Assessment & Plan: Principal Problem:   Acute respiratory failure with hypoxia (HCC) Active Problems:   HCV infection   COVID-19 virus infection   Essential hypertension   CKD (chronic kidney disease)   Renal transplant, status post   HLD (hyperlipidemia)   H/O insulin dependent diabetes mellitus   Chorea   Overweight (BMI 25.0-29.9)   Hypothyroidism  Acute hypoxemic respiratory failure due to covid-19 pneumonia with superimposed bacterial pneumonia, acute CHF: SARS-CoV-2 positive on 2/5. Continue to be severely hypoxemic, requiring 10L on exertion to maintain SpO2 89% on 2/16.  - Wean oxygen as tolerated, may be candidate for DC once resting oxygen needs come down, will be able to pulse higher with exertion at home with his usual minimal exertion. - Completed remdesivir x5 days (2/7 - 2/11) - Completed cefepime (2/10 - 2/14) as well.  - Will wean back from IV steroids to po prednisone (chronic medication at 5mg ) once CRP trends downward further. CRP improving, down to 1.3 today. Check d-dimer in AM - Vitamin C, zinc - Encourage OOB, IS, FV, and awake proning if able - Tylenol and antitussives prn - Continue airborne, contact precautions for 21 days from positive testing. - Enoxaparin prophylactic dose.  - Maintain euvolemia/net negative.  - Avoid NSAIDs   Acute on chronic HFpEF, HTN: Echo  showing EF 55-60%, G1DD, no significant valvular abnormalities and no definite wall motion abnormalities on technically difficult study.  - Continue lasix 20mg  IV BID.  - Monitor I/O (inadequately documented), daily weights, BMP - Holding ACEi as below  Hyperkalemia: Chronic problem per patient - Continue lasix, augmented IV dosing.  - Give lokelma BID today, recheck in AM. Continue telemetry. Personally reviewed today showing no peaked T waves, QTc 427msec.  - Hold lisinopril. Would err on the side of not restarting this at discharge until PCP follow up. Has close relationship with PCP. Will need close follow up.   Facial edema: Without dependent peripheral edema. No stridor/respiratory compromise or progressive nature noted. No plethora though SVC syndrome is possible. Contrast not prudent in CKD patient. Steroid-induced swelling is possible with IV steroids, though pt chronically on prednisone.  - With no significant symptoms, we will continue monitoring. May benefit from noncontrasted study, though no compelling indication at this time.  Stage IIIb CKD s/p renal transplant:  - Continue anti-rejection medications: mycophenolate 500mg  BID, tacrolimus 1.5mg  BID.  - Avoid nephrotoxins as much as possible  IDT2DM: Poorly-controlled with hyperglycemia and hypoglycemia on 2/11. HbA1c 8.5%.  - Continue lantus 24u (down from 35u) daily. Home dose tresiba is 20u daily. Improving control.  - Linagliptin sub for home trulicity.   HLD:  - Continue statin  Supratherapeutic tacrolimus level:  - Restart 2/18.   Chorea, unspecified: Noted, chronic.  At baseline.Recently changed from tetrabenazine to deutetrabenazine due toinsurance issues/coverage/prior auth per recent neurology note. No consistent evidence of either facial/alternative site swelling nor  hyperkalemia associated with this.  - Continue deutetrabenazine.   Overweight: Estimated body mass index is 28.04 kg/m as calculated from the  following:   Height as of this encounter: 5\' 11"  (1.803 m).   Weight as of this encounter: 91.2 kg.  Chronic hepatitis C:  - Monitor LFTs intermittently (currently wnl)  Hypothyroidism: TSH 0.190 recently with free T4 1.36.  - Continue synthroid, will need recheck TFTs. Not clinically hyperthyroid at this time.   DVT prophylaxis: Lovenox Code Status: Full Family Communication: None at bedside Disposition Plan: Patient likely to return home once hypoxemia improves enough to provide supplemental oxygen commensurate to his needs at home. Possibly 2/19.  Consultants:   Pharmacy  Procedures:   None  Antimicrobials:  Remdesivir, cefepime   Subjective: Shortness of breath is significantly improved from prior though remains significant with any exertion, improved with supplemental oxygen, no fevers, chills. +Cough, chorea at baseline.   Objective: Vitals:   06/10/19 0600 06/10/19 0604 06/10/19 0627 06/10/19 0800  BP:    136/82  Pulse:      Resp:      Temp:    (!) 97.3 F (36.3 C)  TempSrc:    Axillary  SpO2: 95% 96% 95%   Weight:      Height:        Intake/Output Summary (Last 24 hours) at 06/10/2019 0947 Last data filed at 06/09/2019 2036 Gross per 24 hour  Intake 410 ml  Output --  Net 410 ml   Filed Weights   05/31/19 1131  Weight: 91.2 kg    Gen: 64 y.o. male in no distress HEENT, Neck: Diffuse edema across jowls and anterior neck without tenderness, erythema/plethora, focal mass/cyst/goiter.  Pulm: Tachypneic at rest with bilateral crackles. No wheezes, no stridor  CV: Regular rate and rhythm. No murmur, rub, or gallop. No JVD, Trace LE edema. GI: Abdomen soft, non-tender, non-distended, with normoactive bowel sounds. No organomegaly or masses felt. Ext: Warm, no deformities Skin: No rashes, lesions or ulcers Neuro: Alert and oriented. No focal neurological deficits. Psych: Judgement and insight appear normal. Mood & affect appropriate.   Data Reviewed: I  have personally reviewed following labs and imaging studies  CBC: Recent Labs  Lab 06/04/19 0300 06/05/19 0207 06/06/19 0100 06/07/19 0450 06/08/19 0317  WBC 10.8* 17.5* 10.5 9.7 10.9*  NEUTROABS 8.7* 13.6*  --   --   --   HGB 12.1* 12.1* 11.3* 11.8* 11.4*  HCT 36.8* 36.2* 34.4* 35.6* 34.3*  MCV 91.1 89.2 90.3 89.2 89.8  PLT 341 354 307 328 833   Basic Metabolic Panel: Recent Labs  Lab 06/04/19 0300 06/04/19 0300 06/05/19 0207 06/05/19 0207 06/06/19 0100 06/06/19 0910 06/06/19 1540 06/07/19 0450 06/08/19 0317 06/09/19 0415 06/10/19 0334  NA 136   < > 137   < > 134*  --   --  135 138 135 135  K 5.8*   < > 5.5*   < > 6.6*   < > 6.4* 6.8* 6.2* 4.9 5.4*  CL 113*   < > 114*   < > 112*  --   --  111 109 107 107  CO2 16*   < > 16*   < > 15*  --   --  18* 22 19* 19*  GLUCOSE 177*   < > 44*   < > 215*  --   --  187* 213* 180* 191*  BUN 73*   < > 62*   < > 64*  --   --  64* 70* 79* 83*  CREATININE 1.82*   < > 1.70*   < > 1.62*  --   --  1.54* 1.71* 1.85* 1.61*  CALCIUM 8.3*   < > 8.4*   < > 8.3*  --   --  8.5* 8.5* 8.3* 8.5*  MG 2.4  --  2.3  --   --   --   --   --   --   --   --    < > = values in this interval not displayed.   GFR: Estimated Creatinine Clearance: 52.9 mL/min (A) (by C-G formula based on SCr of 1.61 mg/dL (H)). Liver Function Tests: Recent Labs  Lab 06/04/19 0300 06/05/19 0207 06/09/19 0415 06/10/19 0334  AST 21 23 12* 15  ALT 17 20 16 17   ALKPHOS 65 71 85 78  BILITOT 0.5 0.7 0.4 0.7  PROT 5.0* 5.0* 5.2* 5.0*  ALBUMIN 2.4* 2.2* 2.4* 2.5*   No results for input(s): LIPASE, AMYLASE in the last 168 hours. No results for input(s): AMMONIA in the last 168 hours. Coagulation Profile: No results for input(s): INR, PROTIME in the last 168 hours. Cardiac Enzymes: No results for input(s): CKTOTAL, CKMB, CKMBINDEX, TROPONINI in the last 168 hours. BNP (last 3 results) No results for input(s): PROBNP in the last 8760 hours. HbA1C: No results for  input(s): HGBA1C in the last 72 hours. CBG: Recent Labs  Lab 06/09/19 0715 06/09/19 1213 06/09/19 1632 06/09/19 1945 06/10/19 0727  GLUCAP 156* 218* 221* 286* 118*   Lipid Profile: No results for input(s): CHOL, HDL, LDLCALC, TRIG, CHOLHDL, LDLDIRECT in the last 72 hours. Thyroid Function Tests: No results for input(s): TSH, T4TOTAL, FREET4, T3FREE, THYROIDAB in the last 72 hours. Anemia Panel: No results for input(s): VITAMINB12, FOLATE, FERRITIN, TIBC, IRON, RETICCTPCT in the last 72 hours. Urine analysis: No results found for: COLORURINE, APPEARANCEUR, LABSPEC, PHURINE, GLUCOSEU, HGBUR, BILIRUBINUR, KETONESUR, PROTEINUR, UROBILINOGEN, NITRITE, LEUKOCYTESUR No results found for this or any previous visit (from the past 240 hour(s)).    Radiology Studies: ECHOCARDIOGRAM COMPLETE  Result Date: 06/08/2019    ECHOCARDIOGRAM REPORT   Patient Name:   ZAE KIRTZ Date of Exam: 06/08/2019 Medical Rec #:  [id number]    Height:       71.0 in Accession #:    [id number]   Weight:       201.1 lb Date of Birth:  1955/05/01    BSA:          2.11 m Patient Age:    5 years     BP:           138/79 mmHg Patient Gender: M            HR:           61 bpm. Exam Location:  Inpatient Procedure: 2D Echo Indications:    CHF-Acute Diastolic 413.24 / M01.02  History:        Patient has no prior history of Echocardiogram examinations.                 Risk Factors:Diabetes and Hypertension. CKD. COVID-19. Chorea.  Sonographer:    Clayton Lefort RDCS (AE) Referring Phys: [place]  Sonographer Comments: Technically difficult study due to poor echo windows. Image acquisition challenging due to respiratory motion. Echocardiogram performed at [facility]. IMPRESSIONS  1. Left ventricular ejection fraction, by estimation, is 55 to 60%. The left ventricle has normal function. LV endocardial border not optimally defined  to evaluate regional wall motion. Left ventricular diastolic parameters are consistent  with Grade I diastolic dysfunction (impaired relaxation).  2. Right ventricular systolic function is normal. The right ventricular size is normal.  3. The mitral valve is grossly normal. No evidence of mitral valve regurgitation. No evidence of mitral stenosis.  4. The aortic valve is grossly normal. Aortic valve regurgitation is not visualized. No aortic stenosis is present. Comparison(s): No prior Echocardiogram. Conclusion(s)/Recomendation(s): Difficult study. Grossly, LV function is normal. Unable to exclude subtle regional WMA due to poor study. FINDINGS  Left Ventricle: Left ventricular ejection fraction, by estimation, is 55 to 60%. The left ventricle has normal function. LV endocardial border not optimally defined to evaluate regional wall motion. There is no left ventricular hypertrophy. Left ventricular diastolic parameters are consistent with Grade I diastolic dysfunction (impaired relaxation). Normal left ventricular filling pressure. Right Ventricle: The right ventricular size is normal. No increase in right ventricular wall thickness. Right ventricular systolic function is normal. Left Atrium: Left atrial size was normal in size. Right Atrium: Right atrial size was normal in size. Pericardium: Trivial pericardial effusion is present. Presence of pericardial fat pad. Mitral Valve: The mitral valve is grossly normal. No evidence of mitral valve regurgitation. No evidence of mitral valve stenosis. Tricuspid Valve: The tricuspid valve is grossly normal. Tricuspid valve regurgitation is not demonstrated. Aortic Valve: The aortic valve is grossly normal. Aortic valve regurgitation is not visualized. No aortic stenosis is present. Aortic valve mean gradient measures 2.0 mmHg. Aortic valve peak gradient measures 4.7 mmHg. Aortic valve area, by VTI measures 2.30 cm. Pulmonic Valve: The pulmonic valve was grossly normal. Pulmonic valve regurgitation is not visualized. Aorta: The aortic root is normal in size  and structure. Venous: The inferior vena cava was not well visualized. IAS/Shunts: No atrial level shunt detected by color flow Doppler.  LEFT VENTRICLE PLAX 2D LVIDd:         4.70 cm  Diastology LVIDs:         2.91 cm  LV e' lateral:   6.85 cm/s LV PW:         0.90 cm  LV E/e' lateral: 11.1 LV IVS:        0.97 cm  LV e' medial:    6.42 cm/s LVOT diam:     2.00 cm  LV E/e' medial:  11.8 LV SV:         51.52 ml LV SV Index:   32.43 LVOT Area:     3.14 cm  RIGHT VENTRICLE RV Basal diam:  2.54 cm RV S prime:     16.90 cm/s TAPSE (M-mode): 2.7 cm LEFT ATRIUM           Index       RIGHT ATRIUM           Index LA diam:      3.00 cm 1.42 cm/m  RA Area:     13.80 cm LA Vol (A2C): 32.5 ml 15.38 ml/m RA Volume:   30.40 ml  14.39 ml/m LA Vol (A4C): 40.9 ml 19.35 ml/m  AORTIC VALVE AV Area (Vmax):    2.15 cm AV Area (Vmean):   2.28 cm AV Area (VTI):     2.30 cm AV Vmax:           108.00 cm/s AV Vmean:          71.100 cm/s AV VTI:            0.224 m AV Peak Grad:  4.7 mmHg AV Mean Grad:      2.0 mmHg LVOT Vmax:         73.90 cm/s LVOT Vmean:        51.500 cm/s LVOT VTI:          0.164 m LVOT/AV VTI ratio: 0.73  AORTA Ao Root diam: 3.30 cm MITRAL VALVE MV Area (PHT): 2.66 cm    SHUNTS MV Decel Time: 285 msec    Systemic VTI:  0.16 m MV E velocity: 75.70 cm/s  Systemic Diam: 2.00 cm MV A velocity: 68.40 cm/s MV E/A ratio:  1.11 Eleonore Chiquito MD Electronically signed by Eleonore Chiquito MD Signature Date/Time: 06/08/2019/1:12:21 PM    Final     Scheduled Meds: . vitamin C  500 mg Oral Daily  . atorvastatin  10 mg Oral q1800  . Deutetrabenazine  6 mg Oral BID  . enoxaparin (LOVENOX) injection  40 mg Subcutaneous Q24H  . furosemide  20 mg Intravenous Q12H  . insulin aspart  0-4 Units Subcutaneous QHS  . insulin aspart  0-9 Units Subcutaneous TID WC  . insulin glargine  24 Units Subcutaneous Daily  . Ipratropium-Albuterol  1 puff Inhalation Q6H  . levothyroxine  100 mcg Oral QAC breakfast  . linagliptin  5 mg  Oral Daily  . sodium zirconium cyclosilicate  5 g Oral BID  . [START ON 06/11/2019] tacrolimus  1.5 mg Oral BID  . zinc sulfate  220 mg Oral Daily   Continuous Infusions:   LOS: 10 days   Time spent: 35 minutes.  Patrecia Pour, MD [facility] [email or web address] 06/10/2019, 9:47 AM

## 2019-06-10 NOTE — Progress Notes (Signed)
Physical Therapy Treatment Patient Details Name: Nathan Tyler MRN: [id number] DOB: February 13, 1956 Today's Date: 06/10/2019    History of Present Illness Pt is a 64 y.o. male directly admitted from [place] [facility] on 05/31/19 with progressive SOB, found to be hypoxic; pt tested (+) COVID-19 on 2/5. PMH includes neuromuscular disorder, chorea, DM, CKD (s/p renal transplant 2010), R foot 5th and L 2nd toe amputations.    PT Comments    Pt seems to be having off day, has same breathing deficits as previous, as soon as he completed supine to sit 02 sats dropped from low 90s to low 80s and pt was not able to recover saturations to 90s. Sat edge of bed and worked on pursed lip breathing, pt has great difficulty with this and tends to take short shallow breaths. Worked on flutter valve and incentive spirometer sitting edge of bed. Initially pt was on 3L/min via [place] and sats in low 90s but with activity needed increase first to 4L/min, then 6L/min he was briefly able to get sats to 87% but with attempted ambulation desat down to 70s again. During ambulation pt was noted to be more unsteady today, noted to be leaning even more backwards with very narrow base of support then started leaning to right and back. Pt did not seem to be aware of this. He did ambulate 43ft but needed walker today (which he had previously declined use of) and return to recliner to recover. Once seated in recliner seemed to be more relaxed and sats increased to mid-high 80s. Pt has been extensively educated on need for working on breathing exercises during down times, to use flutter valve and incentive spirometer as prescribed.      Follow Up Recommendations  Home health PT;Supervision/Assistance - 24 hour     Equipment Recommendations  Rolling walker with 5" wheels    Recommendations for Other Services       Precautions / Restrictions Precautions Precautions: Fall Precaution Comments: chorea, desats, poor  PLB Restrictions Weight Bearing Restrictions: No    Mobility  Bed Mobility Overal bed mobility: Needs Assistance Bed Mobility: Supine to Sit;Sit to Supine     Supine to sit: Supervision Sit to supine: Supervision      Transfers Overall transfer level: Needs assistance Equipment used: None Transfers: Sit to/from [facility] Sit to Stand: Min guard Stand pivot transfers: Min guard          Ambulation/Gait Ambulation/Gait assistance: Mod assist Gait Distance (Feet): 30 Feet Assistive device: Rolling walker (2 wheeled) Gait Pattern/deviations: Shuffle;Ataxic;Narrow base of support(trunk extended, leaning to right)     General Gait Details: today pt is very unsteady in stance, noted to be leaning to right and back. no incidents occured but pt was very unsafe with ambulation today   Stairs             Wheelchair Mobility    Modified Rankin (Stroke Patients Only)       Balance Overall balance assessment: Needs assistance Sitting-balance support: Feet supported;Bilateral upper extremity supported Sitting balance-Leahy Scale: Fair     Standing balance support: During functional activity;Bilateral upper extremity supported Standing balance-Leahy Scale: Poor Standing balance comment: needed walker to safely maintain stance today                            Cognition Arousal/Alertness: Awake/alert Behavior During Therapy: WFL for tasks assessed/performed Overall Cognitive Status: No family/caregiver present to determine baseline cognitive functioning  General Comments: in past he has declined use of walker, today he requested it      Exercises Other Exercises Other Exercises: x10 flutter valve Other Exercises: x10 incentive spirometer Other Exercises: pursed lip breathing    General Comments        Pertinent Vitals/Pain Pain Assessment: No/denies pain    Home Living                       Prior Function            PT Goals (current goals can now be found in the care plan section) Acute Rehab PT Goals Patient Stated Goal: to get better PT Goal Formulation: With patient Time For Goal Achievement: 06/16/19 Potential to Achieve Goals: Good Progress towards PT goals: Progressing toward goals    Frequency    Min 3X/week      PT Plan Current plan remains appropriate    Co-evaluation              AM-PAC PT "6 Clicks" Mobility   Outcome Measure  Help needed turning from your back to your side while in a flat bed without using bedrails?: None Help needed moving from lying on your back to sitting on the side of a flat bed without using bedrails?: A Little Help needed moving to and from a bed to a chair (including a wheelchair)?: A Little Help needed standing up from a chair using your arms (e.g., wheelchair or bedside chair)?: A Little Help needed to walk in hospital room?: A Lot Help needed climbing 3-5 steps with a railing? : A Lot 6 Click Score: 17    End of Session Equipment Utilized During Treatment: Oxygen;Gait belt Activity Tolerance: Patient limited by fatigue;Patient limited by lethargy;Treatment limited secondary to medical complications (Comment) Patient left: in chair;with call bell/phone within reach;with chair alarm set Nurse Communication: Mobility status PT Visit Diagnosis: Unsteadiness on feet (R26.81);Other abnormalities of gait and mobility (R26.89);Muscle weakness (generalized) (M62.81)     Time: 3557-3220 PT Time Calculation (min) (ACUTE ONLY): 35 min  Charges:  $Therapeutic Exercise: 8-22 mins $Therapeutic Activity: 8-22 mins                     Horald Chestnut, PT    Delford Field 06/10/2019, 12:49 PM

## 2019-06-10 NOTE — TOC Progression Note (Signed)
Transition of Care [facility]) - Progression Note    Patient Details  Name: JUSTUS DROKE MRN: [id number] Date of Birth: 06-Dec-1955  [facility] CM/SW Contact  Ross Ludwig, [place] Phone Number: 06/10/2019, 1:58 PM  Clinical Narrative:     CSW updated [place], that patient is not medically ready for discharge yet.   Expected Discharge Plan: [place] Barriers to Discharge: Continued Medical Work up  Expected Discharge Plan and Services Expected Discharge Plan: [place] In-house Referral: Clinical Social Work   Post Acute Care Choice: [place] arrangements for the past 2 months: [place]: PT [place]: [place] Date [place]: 06/08/19 Time [place]: [id number] Representative spoke with at [place]: [place] ([place]) Interventions    Readmission Risk Interventions No flowsheet data found.

## 2019-06-11 LAB — GLUCOSE, CAPILLARY
Glucose-Capillary: 91 mg/dL (ref 70–99)
Glucose-Capillary: 191 mg/dL — ABNORMAL HIGH (ref 70–99)
Glucose-Capillary: 200 mg/dL — ABNORMAL HIGH (ref 70–99)
Glucose-Capillary: 170 mg/dL — ABNORMAL HIGH (ref 70–99)

## 2019-06-11 LAB — BASIC METABOLIC PANEL
Anion gap: 8 (ref 5–15)
BUN: 76 mg/dL — ABNORMAL HIGH (ref 8–23)
CO2: 22 mmol/L (ref 22–32)
Calcium: 8.6 mg/dL — ABNORMAL LOW (ref 8.9–10.3)
Chloride: 106 mmol/L (ref 98–111)
Creatinine, Ser: 1.73 mg/dL — ABNORMAL HIGH (ref 0.61–1.24)
GFR calc Af Amer: 47 mL/min — ABNORMAL LOW (ref >60)
GFR calc non Af Amer: 41 mL/min — ABNORMAL LOW (ref >60)
Glucose, Bld: 171 mg/dL — ABNORMAL HIGH (ref 70–99)
Potassium: 5.2 mmol/L — ABNORMAL HIGH (ref 3.5–5.1)
Sodium: 136 mmol/L (ref 135–145)

## 2019-06-11 LAB — CBC
HCT: 38.1 % — ABNORMAL LOW (ref 39.0–52.0)
Hemoglobin: 12.3 g/dL — ABNORMAL LOW (ref 13.0–17.0)
MCH: 29.2 pg (ref 26.0–34.0)
MCHC: 32.3 g/dL (ref 30.0–36.0)
MCV: 90.5 fL (ref 80.0–100.0)
Platelets: 383 10*3/uL (ref 150–400)
RBC: 4.21 MIL/uL — ABNORMAL LOW (ref 4.22–5.81)
RDW: 13.2 % (ref 11.5–15.5)
WBC: 12.7 10*3/uL — ABNORMAL HIGH (ref 4.0–10.5)
nRBC: 0 % (ref 0.0–0.2)

## 2019-06-11 LAB — C-REACTIVE PROTEIN: CRP: 1.4 mg/dL — ABNORMAL HIGH (ref <1.0)

## 2019-06-11 LAB — D-DIMER, QUANTITATIVE: D-Dimer, Quant: 2.12 ug/mL-FEU — ABNORMAL HIGH (ref 0.00–0.50)

## 2019-06-11 MED ORDER — ENOXAPARIN SODIUM 60 MG/0.6ML ~~LOC~~ SOLN
0.5000 mg/kg | Freq: Two times a day (BID) | SUBCUTANEOUS | Status: DC
  Administered 2019-06-11 – 2019-06-16 (×11): 45 mg via SUBCUTANEOUS
  Filled 2019-06-11 (×11): qty 0.6

## 2019-06-11 MED ORDER — SODIUM ZIRCONIUM CYCLOSILICATE 10 G PO PACK
10.0000 g | PACK | Freq: Two times a day (BID) | ORAL | Status: DC
  Administered 2019-06-11 – 2019-06-16 (×10): 10 g via ORAL
  Filled 2019-06-11 (×13): qty 1

## 2019-06-11 MED ORDER — ALPRAZOLAM 0.25 MG PO TABS: 0.2500 mg | ORAL_TABLET | Freq: Two times a day (BID) | ORAL | Status: DC | PRN

## 2019-06-11 NOTE — Progress Notes (Signed)
PROGRESS NOTE  Nathan Tyler  WLN:[id number] DOB: 08-16-1955 DOA: 05/31/2019 PCP: Celene Squibb, MD   Brief Narrative: Nathan Tyler is a 64 y.o. male with a history of renal transplant, CKD, chronic hepatitis C, IDT2DM, HTN, hypothyroidism, and chorea who was diagnosed with covid-19 by his PCP on 2/5 and directly admitted to [facility] on 2/7 due to hypoxemia. Remdesivir and IV steroids were given with initial improvement followed by abrupt worsening on 2/10 for which antibiotics were started. Hypoxemia has continued with evidence of pulmonary edema on CXR, diuresis has been started and is ongoing.   Assessment & Plan: Principal Problem:   Acute respiratory failure with hypoxia (HCC) Active Problems:   HCV infection   COVID-19 virus infection   Essential hypertension   CKD (chronic kidney disease)   Renal transplant, status post   HLD (hyperlipidemia)   H/O insulin dependent diabetes mellitus   Chorea   Overweight (BMI 25.0-29.9)   Hypothyroidism  Acute hypoxemic respiratory failure due to covid-19 pneumonia with superimposed bacterial pneumonia, acute CHF: SARS-CoV-2 positive on 2/5. Continue to be severely hypoxemic  - Wean oxygen as tolerated, may be candidate for DC once resting oxygen needs come down, will be able to pulse higher with exertion at home with his usual minimal exertion. - Completed remdesivir (2/7 - 2/11), cefepime (2/10 - 2/14)   - Tapered IV decadron 6mg  > 4mg  on 2/17. Will continue at this dose with refractory elevation in CRP. Ultimately will wean back to po prednisone (chronic medication at 5mg ) once CRP trends downward further.  - Vitamin C, zinc - Encourage OOB, IS, FV, and awake proning if able - Tylenol and antitussives prn - Continue airborne, contact precautions for 21 days from positive testing. - D-dimer remains elevated >2 in hospitalized covid patient with limited mobility. Will augment lovenox to 0.5mg /kg q12h for now. Hgb has been completely stable.  Acute on  chronic HFpEF, HTN: Echo showing EF 55-60%, G1DD, no significant valvular abnormalities and no definite wall motion abnormalities on technically difficult study.  - Continue lasix 20mg  IV BID again today. Volume contraction noted in labs though creatinine stable.  - Monitor I/O (inadequately documented), daily weights, BMP - Holding ACEi as below  Hyperkalemia: Chronic problem per patient - Continue augmented lasix dosing.  - Continue lokelma, increase to 10g dosing BID today, recheck daily. Continue telemetry. Personally reviewed today showing no peaked T waves, QTc 414-439msec over previous 12 hours.  - Holding lisinopril. Would err on the side of not restarting this at discharge until PCP follow up. Has close relationship with PCP. Will need close follow up.   Facial edema: Without dependent peripheral edema. No stridor/respiratory compromise or progressive nature noted. No plethora though SVC syndrome is possible. Contrast not prudent in CKD patient. Steroid-induced swelling is possible with IV steroids, though pt chronically on prednisone.  - With no significant symptoms, we will continue monitoring. May benefit from noncontrasted study, though no compelling indication at this time.  Stage IIIb CKD s/p renal transplant:  - Continue anti-rejection medications: mycophenolate 500mg  BID, tacrolimus 1.5mg  BID.  - Avoid nephrotoxins as much as possible including contrast and NSAIDs.  IDT2DM: Poorly-controlled with hyperglycemia and hypoglycemia on 2/11. HbA1c 8.5%.  - Continue lantus 24u (down from 35u) daily. Home dose tresiba is 20u daily. Improving control.  - Linagliptin sub for home trulicity.  - Control improving overall, will not augment with decreased steroid dosing.  HLD:  - Continue statin  Supratherapeutic tacrolimus level:  -  Restart 2/18.   Chorea, unspecified: Noted, chronic.  At baseline.Recently changed from tetrabenazine to deutetrabenazine due toinsurance  issues/coverage/prior auth per recent neurology note. No consistent evidence of either facial/alternative site swelling nor hyperkalemia associated with this.  - Continue deutetrabenazine.   Overweight: Estimated body mass index is 27.24 kg/m as calculated from the following:   Height as of this encounter: 5\' 11"  (1.803 m).   Weight as of this encounter: 88.6 kg.  Chronic hepatitis C:  - Monitor LFTs intermittently (currently wnl)  Hypothyroidism: TSH 0.190 recently with free T4 1.36.  - Continue synthroid, will need recheck TFTs. Not clinically hyperthyroid at this time.   DVT prophylaxis: Lovenox Code Status: Full Family Communication: None at bedside Disposition Plan: Patient likely to return home once hypoxemia improves enough to provide supplemental oxygen commensurate to his needs at home. Possibly 2/19.  Consultants:   Pharmacy  Procedures:   None  Antimicrobials:  Remdesivir, cefepime   Subjective: Was in 70%'s SpO2 on 6L yesterday with exertion, currently denies shortness of breath at rest but is 85% consistently throughout encounter. He does confirm exertional dyspnea which is moderate, overall less than before but still significantly worse than baseline. No chest pain. Feels no pain or other symptoms from facial swelling.   Objective: Vitals:   06/10/19 2000 06/10/19 2341 06/11/19 0500 06/11/19 0803  BP:  (!) 143/94 106/61 119/89  Pulse:  80 73 80  Resp:  19  (!) 22  Temp:  97.7 F (36.5 C) (!) 97.4 F (36.3 C) 97.8 F (36.6 C)  TempSrc:  Oral Axillary Axillary  SpO2: 98% 95% 98% 97%  Weight:   88.6 kg   Height:        Intake/Output Summary (Last 24 hours) at 06/11/2019 1129 Last data filed at 06/11/2019 1017 Gross per 24 hour  Intake 410 ml  Output 1120 ml  Net -710 ml   Filed Weights   05/31/19 1131 06/11/19 0500  Weight: 91.2 kg 88.6 kg   Gen: 64 y.o. male in no distress Pulm: Nonlabored but tachypneic at rest, SpO2 85%-90%. Crackles at L > R  bases. CV: Regular rate and rhythm. No murmur, rub, or gallop. No JVD, no dependent edema. GI: Abdomen soft, non-tender, non-distended, with normoactive bowel sounds.  Ext: Warm, no deformities Skin: No new rashes, lesions or ulcers on visualized skin. Lower face/jaws, neck with diffuse swelling without focal cyst/mass, tenderness or erythema.  Neuro: Alert and oriented. Choreoathetoid movements stable. No new focal neurological deficits. Psych: Judgement and insight appear fair. Mood anxious & affect congruent. Behavior is appropriate.    Data Reviewed: I have personally reviewed following labs and imaging studies  CBC: Recent Labs  Lab 06/05/19 0207 06/06/19 0100 06/07/19 0450 06/08/19 0317 06/11/19 0302  WBC 17.5* 10.5 9.7 10.9* 12.7*  NEUTROABS 13.6*  --   --   --   --   HGB 12.1* 11.3* 11.8* 11.4* 12.3*  HCT 36.2* 34.4* 35.6* 34.3* 38.1*  MCV 89.2 90.3 89.2 89.8 90.5  PLT 354 307 328 323 563   Basic Metabolic Panel: Recent Labs  Lab 06/05/19 0207 06/06/19 0100 06/07/19 0450 06/08/19 0317 06/09/19 0415 06/10/19 0334 06/11/19 0302  NA 137   < > 135 138 135 135 136  K 5.5*   < > 6.8* 6.2* 4.9 5.4* 5.2*  CL 114*   < > 111 109 107 107 106  CO2 16*   < > 18* 22 19* 19* 22  GLUCOSE 44*   < >  187* 213* 180* 191* 171*  BUN 62*   < > 64* 70* 79* 83* 76*  CREATININE 1.70*   < > 1.54* 1.71* 1.85* 1.61* 1.73*  CALCIUM 8.4*   < > 8.5* 8.5* 8.3* 8.5* 8.6*  MG 2.3  --   --   --   --   --   --    < > = values in this interval not displayed.   GFR: Estimated Creatinine Clearance: 45.3 mL/min (A) (by C-G formula based on SCr of 1.73 mg/dL (H)). Liver Function Tests: Recent Labs  Lab 06/05/19 0207 06/09/19 0415 06/10/19 0334  AST 23 12* 15  ALT 20 16 17   ALKPHOS 71 85 78  BILITOT 0.7 0.4 0.7  PROT 5.0* 5.2* 5.0*  ALBUMIN 2.2* 2.4* 2.5*   No results for input(s): LIPASE, AMYLASE in the last 168 hours. No results for input(s): AMMONIA in the last 168 hours. Coagulation  Profile: No results for input(s): INR, PROTIME in the last 168 hours. Cardiac Enzymes: No results for input(s): CKTOTAL, CKMB, CKMBINDEX, TROPONINI in the last 168 hours. BNP (last 3 results) No results for input(s): PROBNP in the last 8760 hours. HbA1C: No results for input(s): HGBA1C in the last 72 hours. CBG: Recent Labs  Lab 06/10/19 0727 06/10/19 1158 06/10/19 1656 06/10/19 1954 06/11/19 0736  GLUCAP 118* 159* 75 185* 91   Lipid Profile: No results for input(s): CHOL, HDL, LDLCALC, TRIG, CHOLHDL, LDLDIRECT in the last 72 hours. Thyroid Function Tests: No results for input(s): TSH, T4TOTAL, FREET4, T3FREE, THYROIDAB in the last 72 hours. Anemia Panel: No results for input(s): VITAMINB12, FOLATE, FERRITIN, TIBC, IRON, RETICCTPCT in the last 72 hours. Urine analysis: No results found for: COLORURINE, APPEARANCEUR, LABSPEC, PHURINE, GLUCOSEU, HGBUR, BILIRUBINUR, KETONESUR, PROTEINUR, UROBILINOGEN, NITRITE, LEUKOCYTESUR No results found for this or any previous visit (from the past 240 hour(s)).    Radiology Studies: No results found.  Scheduled Meds: . vitamin C  500 mg Oral Daily  . atorvastatin  10 mg Oral q1800  . Deutetrabenazine  6 mg Oral BID  . dexamethasone (DECADRON) injection  4 mg Intravenous Daily  . enoxaparin (LOVENOX) injection  40 mg Subcutaneous Q24H  . furosemide  20 mg Intravenous Q12H  . insulin aspart  0-4 Units Subcutaneous QHS  . insulin aspart  0-9 Units Subcutaneous TID WC  . insulin glargine  24 Units Subcutaneous Daily  . Ipratropium-Albuterol  1 puff Inhalation Q6H  . levothyroxine  100 mcg Oral QAC breakfast  . linagliptin  5 mg Oral Daily  . sodium zirconium cyclosilicate  10 g Oral BID  . tacrolimus  1.5 mg Oral BID  . zinc sulfate  220 mg Oral Daily   Continuous Infusions:   LOS: 11 days   Time spent: 35 minutes.  Patrecia Pour, MD [facility] [email or web address] 06/11/2019, 11:29 AM

## 2019-06-11 NOTE — Progress Notes (Signed)
PHYSICAL THERAPY PROGRESS NOTE  CLINICAL IMPRESSION: Pt is still having great difficulty with maintaining 02 saturations with mobility. At start of session pt was on 0L/min and sats were in high 80s, with supine to sit pt sats decreased to low 80s and into 70s, pt had great difficulty with sitting edge of bed and pursed lip breathing to recover. Initially put pt on 2L/min via [place] but had to increase supplemental 02 to 25L/min via NRB to get sats in high 80s and low 90s for mobility. Pt was able to ambulate approx 51ft with min guard assist today but desat to low 80s on 25L via NRB. Once completed with ambulation pt able to sit in recliner and work on breathing, but used NRB to stabilize sats in 90s and was slowly titrated back to 4L/min via [place] with sat of 91% at end of session.    06/11/19 1014  PT Visit Information  Last PT Received On 06/11/19  Assistance Needed +1  History of Present Illness Pt is a 64 y.o. male directly admitted from [place] infusion clinic on 05/31/19 with progressive SOB, found to be hypoxic; pt tested (+) COVID-19 on 2/5. PMH includes neuromuscular disorder, chorea, DM, CKD (s/p renal transplant 2010), R foot 5th and L 2nd toe amputations.  Subjective Data  Patient Stated Goal states he is anxious about mobility and wants to do better  Precautions  Precautions Fall  Precaution Comments chorea, desats, poor PLB  Restrictions  Weight Bearing Restrictions No  Pain Assessment  Pain Assessment No/denies pain  Cognition  Arousal/Alertness Awake/alert  Behavior During Therapy WFL for tasks assessed/performed  Overall Cognitive Status No family/caregiver present to determine baseline cognitive functioning  Bed Mobility  Overal bed mobility Needs Assistance  Bed Mobility Supine to Sit;Sit to Supine  Supine to sit Supervision  Sit to supine Supervision  General bed mobility comments set up and line management needed. once sitting edge of bed desats to 70s. had pt monitored on wall  monitor via earlobe probe and also via nelcor forehead probe.   Transfers  Overall transfer level Needs assistance  Equipment used None  Transfers Sit to/from [facility]  Sit to Stand Min guard  Stand pivot transfers Min guard  General transfer comment able to transfer with min guard assist today, much better balance with mobility noted  Ambulation/Gait  Ambulation/Gait assistance Min assist  Gait Distance (Feet) 40 Feet  Assistive device None  Gait Pattern/deviations Ataxic;Narrow base of support;Step-through pattern  General Gait Details balance and coordination much improved today but 02 saturations decrease with any mobility. Pt ambulated on 25L/min via NRB and sats dropped to min 82%  Gait velocity decreased  Balance  Overall balance assessment Needs assistance  Sitting-balance support Feet supported;Bilateral upper extremity supported  Sitting balance-Leahy Scale Fair  Sitting balance - Comments able to sit edge of bed with UE support and fair trunk control   Standing balance support During functional activity  Standing balance-Leahy Scale Fair  Standing balance comment better with mobility today more control and no leaning noted  Exercises  Exercises Other exercises  Other Exercises  Other Exercises working on pursed lip breathing  PT - End of Session  Equipment Utilized During Treatment Oxygen;Gait belt  Activity Tolerance Patient limited by fatigue;Patient limited by lethargy;Treatment limited secondary to medical complications (Comment)  Patient left in chair;with call bell/phone within reach;with chair alarm set;with nursing/sitter in room  Nurse Communication Mobility status   PT - Assessment/Plan  PT Plan Current  plan remains appropriate  PT Visit Diagnosis Unsteadiness on feet (R26.81);Other abnormalities of gait and mobility (R26.89);Muscle weakness (generalized) (M62.81)  PT Frequency (ACUTE ONLY) Min 3X/week  Follow Up Recommendations Home health  PT;Supervision/Assistance - 24 hour  PT equipment Rolling walker with 5" wheels  AM-PAC PT "6 Clicks" Mobility Outcome Measure (Version 2)  Help needed turning from your back to your side while in a flat bed without using bedrails? 4  Help needed moving from lying on your back to sitting on the side of a flat bed without using bedrails? 3  Help needed moving to and from a bed to a chair (including a wheelchair)? 3  Help needed standing up from a chair using your arms (e.g., wheelchair or bedside chair)? 3  Help needed to walk in hospital room? 3  Help needed climbing 3-5 steps with a railing?  2  6 Click Score 18  Consider Recommendation of Discharge To: Home with [facility]  PT Goal Progression  Progress towards PT goals Progressing toward goals  Acute Rehab PT Goals  PT Goal Formulation With patient  Time For Goal Achievement 06/16/19  Potential to Achieve Goals Good  PT Time Calculation  PT Start Time (ACUTE ONLY) 0930  PT Stop Time (ACUTE ONLY) 1014  PT Time Calculation (min) (ACUTE ONLY) 44 min  PT General Charges  $$ ACUTE PT VISIT 1 Visit  PT Treatments  $Gait Training 8-22 mins  $Therapeutic Activity 8-22 mins  $Self Care/Home Management 8-22    Horald Chestnut, PT

## 2019-06-11 NOTE — Progress Notes (Signed)
Occupational Therapy Treatment Patient Details Name: STEPHANIE MCGLONE MRN: [id number] DOB: May 05, 1955 Today's Date: 06/11/2019    History of present illness Pt is a 64 y.o. male directly admitted from [place] infusion clinic on 05/31/19 with progressive SOB, found to be hypoxic; pt tested (+) COVID-19 on 2/5. PMH includes neuromuscular disorder, chorea, DM, CKD (s/p renal transplant 2010), R foot 5th and L 2nd toe amputations.   OT comments  Patient up in chair on arrival.  Nursing requesting to keep him in chair during session as patient's O2 demand has increased and he experienced significant desat this morning requiring lengthy recovery.  Completed UE theraband exercises while seated to increase activity tolerance and strength for ADLs.  Patient on 5L HFNC with SpO2 95 at rest.  With 10 rep of exercise, SpO2 decreased to 80.  Took 2-3 min rest break in between each exercise to recover and practice pursed lip breathing.  Patient's pursed lip breathing ability has greatly increased since previous visits, still needs some reminders to breath in through nose.  Completed flutter valve and incentive spirometer (pulled 750). Will continue to follow with OT acutely to address the deficits listed below.    Follow Up Recommendations  Home health OT;Supervision/Assistance - 24 hour    Equipment Recommendations  3 in 1 bedside commode    Recommendations for Other Services      Precautions / Restrictions Precautions Precautions: Fall Precaution Comments: Chorea, desats Restrictions Weight Bearing Restrictions: No       Mobility Bed Mobility Overal bed mobility: Needs Assistance         General bed mobility comments: Up in chair  Transfers            Balance Overall balance assessment: Needs assistance Sitting-balance support: Feet supported;Bilateral upper extremity supported Sitting balance-Leahy Scale: Fair S   Standing balance support: During functional activity Standing  balance-Leahy Scale: Fair                            ADL either performed or assessed with clinical judgement   ADL   Eating/Feeding: Set up;Sitting                                     General ADL Comments: Stayed in chair. Nursing requesting no mobility as O2 needs have increased and patient desat significantly this morning.     Vision       Perception     Praxis      Cognition Arousal/Alertness: Awake/alert Behavior During Therapy: WFL for tasks assessed/performed Overall Cognitive Status: No family/caregiver present to determine baseline cognitive functioning                                          Exercises Exercises: General Upper Extremity;Other exercises General Exercises - Upper Extremity Shoulder ABduction: AROM;10 reps;Strengthening;Theraband Theraband Level (Shoulder Abduction): Level 1 (Yellow) Shoulder Horizontal ABduction: AROM;Strengthening;10 reps;Theraband Theraband Level (Shoulder Horizontal Abduction): Level 1 (Yellow) Shoulder Horizontal ADduction: AROM;Strengthening;10 reps;Theraband Theraband Level (Shoulder Horizontal Adduction): Level 1 (Yellow) Elbow Flexion: AROM;Strengthening;10 reps;Theraband Theraband Level (Elbow Flexion): Level 1 (Yellow) Elbow Extension: AROM;Strengthening;10 reps;Theraband Theraband Level (Elbow Extension): Level 1 (Yellow) Other Exercises Other Exercises: Pursed lip breathing Other Exercises: x10 incentive spirometer Other Exercises: x10 flutter valve Other Exercises: 30 sec  of seated marches   Shoulder Instructions       General Comments      Pertinent Vitals/ Pain       Pain Assessment: No/denies pain  Home Living                                          Prior Functioning/Environment              Frequency  Min 3X/week        Progress Toward Goals  OT Goals(current goals can now be found in the care plan section)  Progress  towards OT goals: Progressing toward goals  Acute Rehab OT Goals Patient Stated Goal: Wants to get stronger OT Goal Formulation: With patient Time For Goal Achievement: 06/16/19 Potential to Achieve Goals: Good  Plan Discharge plan remains appropriate    Co-evaluation                 AM-PAC OT "6 Clicks" Daily Activity     Outcome Measure   Help from another person eating meals?: None Help from another person taking care of personal grooming?: A Little Help from another person toileting, which includes using toliet, bedpan, or urinal?: A Little Help from another person bathing (including washing, rinsing, drying)?: A Little Help from another person to put on and taking off regular upper body clothing?: A Little Help from another person to put on and taking off regular lower body clothing?: A Little 6 Click Score: 19    End of Session Equipment Utilized During Treatment: Oxygen  OT Visit Diagnosis: Unsteadiness on feet (R26.81);Other abnormalities of gait and mobility (R26.89);Muscle weakness (generalized) (M62.81);Ataxia, unspecified (R27.0);Other symptoms and signs involving the nervous system (R29.898)   Activity Tolerance Patient tolerated treatment well   Patient Left in chair;with call bell/phone within reach;with chair alarm set   Nurse Communication Mobility status        Time: 5701-7793 OT Time Calculation (min): 28 min  Charges: OT General Charges $OT Visit: 1 Visit OT Treatments $Therapeutic Activity: 23-37 mins  [name], OTR/L    Phylliss Bob 06/11/2019, 2:04 PM

## 2019-06-11 NOTE — Progress Notes (Signed)
Pt's MEWS is yellow d/t increased RR when moving, MD aware.

## 2019-06-12 LAB — GLUCOSE, CAPILLARY
Glucose-Capillary: 221 mg/dL — ABNORMAL HIGH (ref 70–99)
Glucose-Capillary: 166 mg/dL — ABNORMAL HIGH (ref 70–99)
Glucose-Capillary: 268 mg/dL — ABNORMAL HIGH (ref 70–99)
Glucose-Capillary: 302 mg/dL — ABNORMAL HIGH (ref 70–99)
Glucose-Capillary: 285 mg/dL — ABNORMAL HIGH (ref 70–99)

## 2019-06-12 LAB — BASIC METABOLIC PANEL
Anion gap: 8 (ref 5–15)
BUN: 77 mg/dL — ABNORMAL HIGH (ref 8–23)
CO2: 24 mmol/L (ref 22–32)
Calcium: 8.6 mg/dL — ABNORMAL LOW (ref 8.9–10.3)
Chloride: 103 mmol/L (ref 98–111)
Creatinine, Ser: 2.09 mg/dL — ABNORMAL HIGH (ref 0.61–1.24)
GFR calc Af Amer: 37 mL/min — ABNORMAL LOW (ref >60)
GFR calc non Af Amer: 32 mL/min — ABNORMAL LOW (ref >60)
Glucose, Bld: 272 mg/dL — ABNORMAL HIGH (ref 70–99)
Potassium: 5.2 mmol/L — ABNORMAL HIGH (ref 3.5–5.1)
Sodium: 135 mmol/L (ref 135–145)

## 2019-06-12 LAB — C-REACTIVE PROTEIN: CRP: 1 mg/dL — ABNORMAL HIGH (ref <1.0)

## 2019-06-12 LAB — D-DIMER, QUANTITATIVE: D-Dimer, Quant: 1.79 ug/mL-FEU — ABNORMAL HIGH (ref 0.00–0.50)

## 2019-06-12 MED ORDER — PREDNISONE 20 MG PO TABS
20.0000 mg | ORAL_TABLET | Freq: Every day | ORAL | Status: DC
  Administered 2019-06-12 – 2019-06-14 (×3): 20 mg via ORAL
  Filled 2019-06-12 (×3): qty 1

## 2019-06-12 NOTE — Plan of Care (Signed)
Pt weaned down to 1L [place]. Able to tolerate RA when at rest, sats >94%. However, pt still desaturates to low 80s and slow recovery with minimal activity such as transfer to [facility].   Problem: Respiratory: Goal: Will maintain a patent airway Outcome: Progressing Goal: Complications related to the disease process, condition or treatment will be avoided or minimized Outcome: Progressing   Problem: Education: Goal: Knowledge of risk factors and measures for prevention of condition will improve Outcome: Progressing

## 2019-06-12 NOTE — Progress Notes (Signed)
Occupational Therapy Treatment Patient Details Name: Nathan Tyler MRN: [id number] DOB: May 05, 1955 Today's Date: 06/12/2019    History of present illness Pt is a 64 y.o. male directly admitted from [place] [facility] on 05/31/19 with progressive SOB, found to be hypoxic; pt tested (+) COVID-19 on 2/5. PMH includes neuromuscular disorder, chorea, DM, CKD (s/p renal transplant 2010), R foot 5th and L 2nd toe amputations.   OT comments  Patient in bed on arrival and very motivated for therapy.  Likes to know how he is doing and really wants to get better.  Patient completed bed mobility modified independent and walked to sink with min guard.  Patient is still impulsive and tends to move quickly but has become more aware of it and improved over length of stay.  He completed standing grooming at sink with supervision.  Reviewed and practiced UE therband strengthening exercises.  Focused on independently initiating rest breaks and using energy conservation strategies.  Patient able to identify when rest breaks were needed 50% of time, showing improvement.  He started on 1L O2 and SpO2 94 at rest. With standing activities increased to 3L O2 as SpO2 dropped to 80. Increased to 4L with seated exercise. Consistently dropped to 84 each set but recovered with pursed lip breathing and rest to mid 90s.  Left on 2.5L. Will continue to follow with OT acutely to address the deficits listed below.    Follow Up Recommendations  Home health OT;Supervision/Assistance - 24 hour    Equipment Recommendations  3 in 1 bedside commode    Recommendations for Other Services      Precautions / Restrictions Precautions Precautions: Fall Precaution Comments: Chorea, desats Restrictions Weight Bearing Restrictions: No       Mobility Bed Mobility Overal bed mobility: Modified Independent Bed Mobility: Supine to Sit     Supine to sit: Modified independent (Device/Increase time)        Transfers Overall transfer  level: Needs assistance Equipment used: None Transfers: Sit to/from [facility] Sit to Stand: Min guard Stand pivot transfers: Min guard            Balance Overall balance assessment: Needs assistance Sitting-balance support: Feet supported Sitting balance-Leahy Scale: Fair     Standing balance support: During functional activity;No upper extremity supported Standing balance-Leahy Scale: Fair                             ADL either performed or assessed with clinical judgement   ADL Overall ADL's : Needs assistance/impaired Eating/Feeding: Set up;Sitting   Grooming: Wash/dry hands;Wash/dry face;Oral care;Supervision/safety;Standing           Upper Body Dressing : Set up;Sitting                   Functional mobility during ADLs: Min guard       Vision       Perception     Praxis      Cognition Arousal/Alertness: Awake/alert Behavior During Therapy: WFL for tasks assessed/performed Overall Cognitive Status: No family/caregiver present to determine baseline cognitive functioning                                 General Comments: Very motivated, wants to do well        Exercises Exercises: General Upper Extremity;Other exercises General Exercises - Upper Extremity Shoulder ABduction: AROM;10 reps;Strengthening;Theraband Theraband  Level (Shoulder Abduction): Level 1 (Yellow) Shoulder Horizontal ABduction: AROM;Strengthening;10 reps;Theraband Theraband Level (Shoulder Horizontal Abduction): Level 1 (Yellow) Shoulder Horizontal ADduction: AROM;Strengthening;10 reps;Theraband Theraband Level (Shoulder Horizontal Adduction): Level 1 (Yellow) Elbow Flexion: AROM;Strengthening;10 reps;Theraband Theraband Level (Elbow Flexion): Level 1 (Yellow) Elbow Extension: AROM;Strengthening;10 reps;Theraband Theraband Level (Elbow Extension): Level 1 (Yellow) Other Exercises Other Exercises: Pursed lip breathing Other  Exercises: x10 incentive spirometer Other Exercises: x10 flutter valve Other Exercises: 30 sec of seated marches   Shoulder Instructions       General Comments      Pertinent Vitals/ Pain       Pain Assessment: No/denies pain  Home Living                                          Prior Functioning/Environment              Frequency  Min 3X/week        Progress Toward Goals  OT Goals(current goals can now be found in the care plan section)  Progress towards OT goals: Progressing toward goals  Acute Rehab OT Goals Patient Stated Goal: Wants to get stronger OT Goal Formulation: With patient Time For Goal Achievement: 06/16/19 Potential to Achieve Goals: Good  Plan Discharge plan remains appropriate    Co-evaluation                 AM-PAC OT "6 Clicks" Daily Activity     Outcome Measure   Help from another person eating meals?: None Help from another person taking care of personal grooming?: A Little Help from another person toileting, which includes using toliet, bedpan, or urinal?: A Little Help from another person bathing (including washing, rinsing, drying)?: A Little Help from another person to put on and taking off regular upper body clothing?: A Little Help from another person to put on and taking off regular lower body clothing?: A Little 6 Click Score: 19    End of Session Equipment Utilized During Treatment: Oxygen  OT Visit Diagnosis: Unsteadiness on feet (R26.81);Other abnormalities of gait and mobility (R26.89);Muscle weakness (generalized) (M62.81);Ataxia, unspecified (R27.0);Other symptoms and signs involving the nervous system (R29.898)   Activity Tolerance Patient tolerated treatment well   Patient Left in chair;with call bell/phone within reach;with chair alarm set   Nurse Communication Mobility status        Time: [phone] OT Time Calculation (min): 43 min  Charges: OT General Charges $OT Visit: 1  Visit OT Treatments $Self Care/Home Management : 8-22 mins $Therapeutic Activity: 23-37 mins  [name], OTR/L    Phylliss Bob 06/12/2019, 10:05 AM

## 2019-06-12 NOTE — Progress Notes (Signed)
Pt A&Ox4 OOB to recliner, CHG bath complete, denies pain or discomfort. O2@2L  Sats 88-92% nasal canula, pt will desat when transferring, SOB with exertion, continue inhaler as ordered. No s/s of hypo/hyperglycemia, sliding scale insulin as ordered. Co-op and pleasant will cont to monitor.

## 2019-06-12 NOTE — Progress Notes (Signed)
PROGRESS NOTE  Nathan Tyler  WSF:[id number] DOB: 1955/07/14 DOA: 05/31/2019 PCP: Celene Squibb, MD   Brief Narrative: Nathan Tyler is a 64 y.o. male with a history of renal transplant, CKD, chronic hepatitis C, IDT2DM, HTN, hypothyroidism, and chorea who was diagnosed with covid-19 by his PCP on 2/5 and directly admitted to [facility] on 2/7 due to hypoxemia. Remdesivir and IV steroids were given with initial improvement followed by abrupt worsening on 2/10 for which antibiotics were started. Hypoxemia has continued with evidence of pulmonary edema on CXR, echocardiogram showed grade 1 diastolic dysfunction. Home lasix was agumented to lasix 20mg  IV BID for several days with good response. On 2/19, creatinine rose and weights have trended downward to 195lbs (below previously suspected dry weight). Lasix is held, IV steroids transitioned to prednisone.  Assessment & Plan: Principal Problem:   Acute respiratory failure with hypoxia (HCC) Active Problems:   HCV infection   COVID-19 virus infection   Essential hypertension   CKD (chronic kidney disease)   Renal transplant, status post   HLD (hyperlipidemia)   H/O insulin dependent diabetes mellitus   Chorea   Overweight (BMI 25.0-29.9)   Hypothyroidism  Acute hypoxemic respiratory failure due to covid-19 pneumonia with superimposed bacterial pneumonia, acute CHF: SARS-CoV-2 positive on 2/5. Exertional hypoxemia remains, though oxygen needs at rest are improving.  - Appears to be euvolemic, at 195lbs x2 days, down from 201 at admission and from 220lbs documented at outpatient neurology visit Nov 2020. Will stop lasix now, check CXR in AM.  - Wean oxygen as tolerated, may be candidate for DC once resting oxygen needs come down, will be able to pulse higher with exertion at home with his usual minimal exertion. - Completed remdesivir (2/7 - 2/11), cefepime (2/10 - 2/14)   - Tapered IV decadron 6mg  > 4mg  2/17 > PO prednisone 20mg  2/19. CRP durably improved.  Plan to taper slowly back to baseline dose of 5mg .   - Vitamin C, zinc - Encourage OOB, IS, FV, and awake proning if able - Tylenol and antitussives prn - Continue airborne, contact precautions for 21 days from positive testing. - D-dimer remains elevated >2 in hospitalized covid patient with limited mobility. Continue lovenox 0.5mg /kg q12h for now.   Acute on chronic HFpEF, HTN: Echo showing EF 55-60%, G1DD, no significant valvular abnormalities and no definite wall motion abnormalities on technically difficult study.  - Hold lasix with creatinine rise today and appearing euvolemic, below dry weight. Likely to restart home dose once creatinine improved nearer baseline.  - Monitor I/O (inadequately documented), daily weights, BMP - Holding ACEi as below  Hyperkalemia: Chronic problem per patient. Appears stable. - Continue lokelma 10g BID today, recheck K daily.  - Telemetry showing stability, no peaked t waves, QTc 453msec on my personal review again today.  - Holding lisinopril. Will hold at discharge until PCP follow up. Has close relationship with PCP. Will need close follow up.   Facial edema: Without dependent peripheral edema. No stridor/respiratory compromise or progressive nature noted. No plethora though SVC syndrome is possible. Contrast not prudent in CKD patient. Steroid-induced swelling is possible with IV steroids, though pt chronically on prednisone.  - With no significant symptoms, we will continue monitoring. May benefit from noncontrasted study, though no compelling indication at this time.  Stage IIIb CKD s/p renal transplant:  - Continue anti-rejection medications: mycophenolate 500mg  BID, tacrolimus 1.5mg  BID.  - Avoid nephrotoxins as much as possible including contrast and NSAIDs.  IDT2DM: Poorly-controlled  with hyperglycemia and hypoglycemia on 2/11. HbA1c 8.5%.  - Continue lantus 24u daily, Home dose tresiba is 20u daily.  - Linagliptin sub for home trulicity.  -  Control improving overall, will not augment with decreased steroid dosing.  HLD:  - Continue statin  Supratherapeutic tacrolimus level: Dosing per pharmacy.  Chorea, unspecified: Noted, chronic, followed by neurology, Dr. Vincenza Hews.  At baseline.Recently changed from tetrabenazine to deutetrabenazine due toinsurance issues/coverage/prior auth per recent neurology note. No consistent evidence of either facial/alternative site swelling nor hyperkalemia associated with this.  - Continue deutetrabenazine.   Overweight: Estimated body mass index is 27.33 kg/m as calculated from the following:   Height as of this encounter: 5\' 11"  (1.803 m).   Weight as of this encounter: 88.9 kg.  Chronic hepatitis C:  - Monitor LFTs intermittently (currently wnl)  Hypothyroidism: TSH 0.190 recently with free T4 1.36.  - Continue synthroid, will need recheck TFTs. Not clinically hyperthyroid at this time.   DVT prophylaxis: Lovenox Code Status: Full Family Communication: None at bedside Disposition Plan: Patient likely to return home once hypoxemia improves enough to provide supplemental oxygen commensurate to his needs at home. REmains hypoxemic at rest this AM. Will monitor CXR, labs including renal function after completing aggressive diuresis, possibly discharge 2/20.  Consultants:   Pharmacy  Procedures:   None  Antimicrobials:  Remdesivir, cefepime   Subjective: Reporting day-by-day improvement in exertional capacity. Remains short of breath worse with exertion, better with rest and with oxygen. No chest pain reported, no dependent swelling. Wants to go home as soon as possible.   Objective: Vitals:   06/11/19 1911 06/12/19 0340 06/12/19 0539 06/12/19 0806  BP:  130/89  135/75  Pulse: 78 68  77  Resp: (!) 24 18  19   Temp:  97.9 F (36.6 C)  (!) 97 F (36.1 C)  TempSrc:  Oral  Axillary  SpO2: 97% 97%    Weight:   88.9 kg   Height:        Intake/Output Summary (Last 24 hours) at  06/12/2019 1102 Last data filed at 06/11/2019 2132 Gross per 24 hour  Intake 480 ml  Output 275 ml  Net 205 ml   Filed Weights   05/31/19 1131 06/11/19 0500 06/12/19 0539  Weight: 91.2 kg 88.6 kg 88.9 kg   Gen: 64 y.o. male in no distress Pulm: Nonlabored, crackles are significantly improved from prior exams. CV: Regular rate and rhythm. No murmur, rub, or gallop. No definite JVD, no dependent edema. GI: Abdomen soft, non-tender, non-distended, with normoactive bowel sounds.  Ext: Warm, no deformities Skin: No rashes, lesions or ulcers on visualized skin. Neuro: Alert and oriented. STable, mild L > R choreiform movements. No focal neurological deficits. Psych: Judgement and insight appear fair. Mood euthymic & affect congruent. Behavior is appropriate.    Data Reviewed: I have personally reviewed following labs and imaging studies  CBC: Recent Labs  Lab 06/06/19 0100 06/07/19 0450 06/08/19 0317 06/11/19 0302  WBC 10.5 9.7 10.9* 12.7*  HGB 11.3* 11.8* 11.4* 12.3*  HCT 34.4* 35.6* 34.3* 38.1*  MCV 90.3 89.2 89.8 90.5  PLT 307 328 323 096   Basic Metabolic Panel: Recent Labs  Lab 06/08/19 0317 06/09/19 0415 06/10/19 0334 06/11/19 0302 06/12/19 0302  NA 138 135 135 136 135  K 6.2* 4.9 5.4* 5.2* 5.2*  CL 109 107 107 106 103  CO2 22 19* 19* 22 24  GLUCOSE 213* 180* 191* 171* 272*  BUN 70* 79* 83* 76*  77*  CREATININE 1.71* 1.85* 1.61* 1.73* 2.09*  CALCIUM 8.5* 8.3* 8.5* 8.6* 8.6*   GFR: Estimated Creatinine Clearance: 37.5 mL/min (A) (by C-G formula based on SCr of 2.09 mg/dL (H)). Liver Function Tests: Recent Labs  Lab 06/09/19 0415 06/10/19 0334  AST 12* 15  ALT 16 17  ALKPHOS 85 78  BILITOT 0.4 0.7  PROT 5.2* 5.0*  ALBUMIN 2.4* 2.5*   No results for input(s): LIPASE, AMYLASE in the last 168 hours. No results for input(s): AMMONIA in the last 168 hours. Coagulation Profile: No results for input(s): INR, PROTIME in the last 168 hours. Cardiac Enzymes: No  results for input(s): CKTOTAL, CKMB, CKMBINDEX, TROPONINI in the last 168 hours. BNP (last 3 results) No results for input(s): PROBNP in the last 8760 hours. HbA1C: No results for input(s): HGBA1C in the last 72 hours. CBG: Recent Labs  Lab 06/11/19 0736 06/11/19 1122 06/11/19 1554 06/11/19 2114 06/12/19 0802  GLUCAP 91 191* 200* 170* 221*   Lipid Profile: No results for input(s): CHOL, HDL, LDLCALC, TRIG, CHOLHDL, LDLDIRECT in the last 72 hours. Thyroid Function Tests: No results for input(s): TSH, T4TOTAL, FREET4, T3FREE, THYROIDAB in the last 72 hours. Anemia Panel: No results for input(s): VITAMINB12, FOLATE, FERRITIN, TIBC, IRON, RETICCTPCT in the last 72 hours. Urine analysis: No results found for: COLORURINE, APPEARANCEUR, LABSPEC, PHURINE, GLUCOSEU, HGBUR, BILIRUBINUR, KETONESUR, PROTEINUR, UROBILINOGEN, NITRITE, LEUKOCYTESUR No results found for this or any previous visit (from the past 240 hour(s)).    Radiology Studies: No results found.  Scheduled Meds: . vitamin C  500 mg Oral Daily  . atorvastatin  10 mg Oral q1800  . Deutetrabenazine  6 mg Oral BID  . enoxaparin (LOVENOX) injection  0.5 mg/kg Subcutaneous Q12H  . insulin aspart  0-4 Units Subcutaneous QHS  . insulin aspart  0-9 Units Subcutaneous TID WC  . insulin glargine  24 Units Subcutaneous Daily  . Ipratropium-Albuterol  1 puff Inhalation Q6H  . levothyroxine  100 mcg Oral QAC breakfast  . linagliptin  5 mg Oral Daily  . predniSONE  20 mg Oral Q breakfast  . sodium zirconium cyclosilicate  10 g Oral BID  . tacrolimus  1.5 mg Oral BID  . zinc sulfate  220 mg Oral Daily   Continuous Infusions:   LOS: 12 days   Time spent: 35 minutes.  Patrecia Pour, MD [facility] [email or web address] 06/12/2019, 11:02 AM

## 2019-06-12 NOTE — Progress Notes (Signed)
Physical Therapy Treatment Patient Details Name: Nathan Tyler MRN: [id number] DOB: [birth year] Today's Date: 06/12/2019    History of Present Illness Pt is a 64 y.o. male directly admitted from [place] [facility] on 05/31/19 with progressive SOB, found to be hypoxic; pt tested (+) COVID-19 on 2/5. PMH includes neuromuscular disorder, chorea, DM, CKD (s/p renal transplant 2010), R foot 5th and L 2nd toe amputations.    PT Comments    Patient now on 1.5L oxygen via [place] at start of session. 3 gait trials during session, 2 on room air, 3rd with 1.5L suppl oxygen with extension tubing for patient to practice oxygen tubing mgmt in case of discharge home on oxygen. Patient mildly unsteady with first gait trial 22ft without AD but no overt LOB. Improved gait quality with use of RW 2nd gait trial 1ft. 3rd gait trial 42ft with RW and patient attempting to managing oxygen tubing but required cues and assist from PT to ensure safety. If patient is to discharge home on suppl oxygen he will need increased practice and training for safety. Recommend use of RW for mobility initially and home PT services. Patient reports his girlfriend works 5 days per week so he will be home alone. Father is there but patient will be isolating on first floor as father is well currently.  Oxygen saturation with similar response during mobility with and without suppl oxygen.  SpO2 81%, HR 111 bpm post ambulation 53ft on room air no AD --> 91%, HR 101 bpm with seated rest approx 1 minute SpO2 86% on room with RW 48ft, improved gait quality --> down to 80% with seated rest, HR 109 bpm --> within 30 seconds seated rest SpO2 88%, HR 105 bpm 1.5L [place] with ambulation 42ft with RW and pt managing oxygen tubing --> down to 83%, HR 114 bpm -->89%, 102 bpm within 1 minute seated rest Patient rates 6/10 SOB with mobility  1.5L at end of session, 92%, HR 98 bpm, RR 27   Follow Up Recommendations  Home health PT;Supervision/Assistance - 24  hour     Equipment Recommendations  Rolling walker with 5" wheels    Recommendations for Other Services       Precautions / Restrictions Precautions Precautions: Fall Precaution Comments: chorea, monitor SpO2 Restrictions Weight Bearing Restrictions: No    Mobility  Bed Mobility  General bed mobility comments: Already OOB in chair  Transfers Overall transfer level: Needs assistance Equipment used: None;Rolling walker (2 wheeled) Transfers: Sit to/from Stand Sit to Stand: Supervision;Modified independent (Device/Increase time)         General transfer comment: sit<>stand without AD x 2 trials modI, sit<>stand with RW and supervision with cues for hand placement  Ambulation/Gait Ambulation/Gait assistance: Supervision((close supervision)) Gait Distance (Feet): 60 Feet(x2, 30 with RW) Assistive device: None;Rolling walker (2 wheeled) Gait Pattern/deviations: Step-through pattern(increased lateral sway, decreased L knee flexion) Gait velocity: decreased   General Gait Details: Patient reports his gait quality is below his baseline. Cl supervision provided for ambulation without AD 50ft in room on room air. 2nd trial of 76ft with use of RW with improved gait quality. 3rd gait trial with RW 81ft in room and patient attempting to managing oxygen tubing with cues and intermittent assistance from PT for safety with RW and oxygen tubing.   Stairs Stairs: (no steps to enter, stairs inside home, plans to stay on 1st )         Balance Overall balance assessment: Needs assistance  Standing balance support: No upper extremity supported Standing balance-Leahy Scale: (Good-) Standing balance comment: Improved dynamic standing balance with use of RW. Patient agrees with use of RW initially for mobility to increase safety and improve activity tolerance.       Cognition Arousal/Alertness: Awake/alert Behavior During Therapy: WFL for tasks assessed/performed               Pertinent Vitals/Pain Pain Assessment: No/denies pain(No complaints of pain. No signs/symptoms of pain.)           PT Goals (current goals can now be found in the care plan section) Acute Rehab PT Goals Patient Stated Goal: wants to go home as soon as possible Progress towards PT goals: Progressing toward goals    Frequency    Min 3X/week      PT Plan Current plan remains appropriate       AM-PAC PT "6 Clicks" Mobility   Outcome Measure  Help needed turning from your back to your side while in a flat bed without using bedrails?: None Help needed moving from lying on your back to sitting on the side of a flat bed without using bedrails?: A Little Help needed moving to and from a bed to a chair (including a wheelchair)?: A Little Help needed standing up from a chair using your arms (e.g., wheelchair or bedside chair)?: None Help needed to walk in hospital room?: A Little Help needed climbing 3-5 steps with a railing? : A Lot 6 Click Score: 19    End of Session Equipment Utilized During Treatment: Oxygen Activity Tolerance: Patient limited by fatigue Patient left: in chair;with call bell/phone within reach;with chair alarm set Nurse Communication: Mobility status(secure message to nurse, unable to reach via vocera or phone) PT Visit Diagnosis: Unsteadiness on feet (R26.81);Other abnormalities of gait and mobility (R26.89);Muscle weakness (generalized) (M62.81)     Time: 4818-5631 PT Time Calculation (min) (ACUTE ONLY): 32 min  Charges:  $Gait Training: 23-37 mins                    Birdie Hopes, DPT, PT Acute Rehab [phone]    Birdie Hopes 06/12/2019, 3:47 PM

## 2019-06-13 ENCOUNTER — Inpatient Hospital Stay (HOSPITAL_COMMUNITY): Payer: Medicare Other

## 2019-06-13 DIAGNOSIS — U071 COVID-19: Principal | ICD-10-CM

## 2019-06-13 DIAGNOSIS — J939 Pneumothorax, unspecified: Secondary | ICD-10-CM

## 2019-06-13 DIAGNOSIS — J9601 Acute respiratory failure with hypoxia: Secondary | ICD-10-CM

## 2019-06-13 LAB — BASIC METABOLIC PANEL
Anion gap: 8 (ref 5–15)
BUN: 75 mg/dL — ABNORMAL HIGH (ref 8–23)
CO2: 24 mmol/L (ref 22–32)
Calcium: 8.3 mg/dL — ABNORMAL LOW (ref 8.9–10.3)
Chloride: 100 mmol/L (ref 98–111)
Creatinine, Ser: 1.81 mg/dL — ABNORMAL HIGH (ref 0.61–1.24)
GFR calc Af Amer: 44 mL/min — ABNORMAL LOW (ref >60)
GFR calc non Af Amer: 38 mL/min — ABNORMAL LOW (ref >60)
Glucose, Bld: 228 mg/dL — ABNORMAL HIGH (ref 70–99)
Potassium: 5.1 mmol/L (ref 3.5–5.1)
Sodium: 132 mmol/L — ABNORMAL LOW (ref 135–145)

## 2019-06-13 LAB — GLUCOSE, CAPILLARY
Glucose-Capillary: 182 mg/dL — ABNORMAL HIGH (ref 70–99)
Glucose-Capillary: 139 mg/dL — ABNORMAL HIGH (ref 70–99)
Glucose-Capillary: 230 mg/dL — ABNORMAL HIGH (ref 70–99)
Glucose-Capillary: 293 mg/dL — ABNORMAL HIGH (ref 70–99)

## 2019-06-13 LAB — MRSA PCR SCREENING: MRSA by PCR: NEGATIVE

## 2019-06-13 LAB — D-DIMER, QUANTITATIVE: D-Dimer, Quant: 1.65 ug/mL-FEU — ABNORMAL HIGH (ref 0.00–0.50)

## 2019-06-13 LAB — C-REACTIVE PROTEIN: CRP: 0.9 mg/dL (ref <1.0)

## 2019-06-13 MED ORDER — ORAL CARE MOUTH RINSE
15.0000 mL | Freq: Two times a day (BID) | OROMUCOSAL | Status: DC
  Administered 2019-06-13 – 2019-06-16 (×6): 15 mL via OROMUCOSAL

## 2019-06-13 MED ORDER — CHLORHEXIDINE GLUCONATE 0.12 % MT SOLN
15.0000 mL | Freq: Two times a day (BID) | OROMUCOSAL | Status: DC
  Administered 2019-06-13 – 2019-06-16 (×7): 15 mL via OROMUCOSAL
  Filled 2019-06-13 (×5): qty 15

## 2019-06-13 MED ORDER — FUROSEMIDE 20 MG PO TABS
20.0000 mg | ORAL_TABLET | Freq: Every day | ORAL | Status: DC
  Administered 2019-06-14 – 2019-06-16 (×3): 20 mg via ORAL
  Filled 2019-06-13 (×3): qty 1

## 2019-06-13 MED ORDER — CHLORHEXIDINE GLUCONATE CLOTH 2 % EX PADS
6.0000 | MEDICATED_PAD | Freq: Every day | CUTANEOUS | Status: DC
  Administered 2019-06-13 – 2019-06-16 (×4): 6 via TOPICAL

## 2019-06-13 NOTE — Plan of Care (Signed)
Pt weaned down to RA which he tolerates well at rest.  Pt still desaturates to low 80s with minimal activity but recovery time with O2 at 2-4L [place] has improved significantly from previous night. Plan for possible dc today.   Problem: Education: Goal: Knowledge of risk factors and measures for prevention of condition will improve Outcome: Progressing   Problem: Coping: Goal: Psychosocial and spiritual needs will be supported Outcome: Progressing   Problem: Respiratory: Goal: Will maintain a patent airway Outcome: Progressing Goal: Complications related to the disease process, condition or treatment will be avoided or minimized Outcome: Progressing

## 2019-06-13 NOTE — Procedures (Signed)
Chest Tube Insertion Procedure Note  Indications:  Clinically significant Pneumothorax  Pre-operative Diagnosis: Pneumothorax  Post-operative Diagnosis: Pneumothorax  Procedure Details  Informed consent was obtained for the procedure, including sedation.  Risks of lung perforation, hemorrhage, arrhythmia, and adverse drug reaction were discussed.   After sterile skin prep, using standard technique, a 14 French tube was placed in the right lateral 9th rib space.  Findings: None  Estimated Blood Loss:  Minimal         Specimens:  None              Complications:  None; patient tolerated the procedure well.         Disposition: ICU - extubated and stable.         Condition: stable  Attending Attestation: I performed the procedure.  Roselie Awkward, MD [facility] Pager: [phone] Cell: [phone] If no response, call [phone]

## 2019-06-13 NOTE — Progress Notes (Signed)
Pt arrived in the ICU this morning from the PCU. AOx4 and on room air. No complaints of pain. Complaints of dyspnea on exertion. O2 sats decreased to 80%-83% during movement, however increased to 88% in less than 5 minutes once he was done moving from the wheelchair to the bed.   Pt belongings include his cell phone and phone charger, coat, jeans, shoes, shorts, long sleeve shirt, and t-shirt. Patient stated that his wallet, keys, and watch were given to his girlfriend, Terri.   RN spoke with Karna Christmas this morning and updated her on his plan of care. Let her know that he has been transferred to the ICU and that a chest tube will be placed shortly.

## 2019-06-13 NOTE — Progress Notes (Signed)
PROGRESS NOTE  Nathan Tyler  ZGY:[id number] DOB: Jan 15, 1956 DOA: 05/31/2019 PCP: Celene Squibb, MD   Brief Narrative: Nathan Tyler is a 64 y.o. male with a history of renal transplant, CKD, chronic hepatitis C, IDT2DM, HTN, hypothyroidism, and chorea who was diagnosed with covid-19 by his PCP on 2/5 and directly admitted to [facility] on 2/7 due to hypoxemia. Remdesivir and IV steroids were given with initial improvement followed by abrupt worsening on 2/10 for which antibiotics were started. Hypoxemia has continued with evidence of pulmonary edema on CXR, echocardiogram showed grade 1 diastolic dysfunction. Home lasix was agumented to lasix 20mg  IV BID for several days with good response, held on 2/19 due to creatinine bump. CXR repeated 2/20 showing right-sided PTX and diffuse subcutaneous air. PCM consulted, patient transferred to ICU for chest tube insertion.  Assessment & Plan: Principal Problem:   Acute respiratory failure with hypoxia (HCC) Active Problems:   HCV infection   COVID-19 virus infection   Essential hypertension   CKD (chronic kidney disease)   Renal transplant, status post   HLD (hyperlipidemia)   H/O insulin dependent diabetes mellitus   Chorea   Overweight (BMI 25.0-29.9)   Hypothyroidism  Acute hypoxemic respiratory failure due to covid-19 pneumonia with superimposed bacterial pneumonia, acute CHF, pneumothorax: SARS-CoV-2 positive on 2/5. Remains hypoxemic. - Transfer to ICU, PCCM consulted planning right-sided chest tube 2/20. Follow serial CXR. - Appears to be euvolemic, at 195lbs x2 days, down from 201 at admission and from 220lbs documented at outpatient neurology visit Nov 2020. Continue to hold lasix.  - Completed remdesivir (2/7 - 2/11), cefepime (2/10 - 2/14)   - Tapered IV decadron 6mg  > 4mg  2/17 > PO prednisone 20mg  2/19. CRP durably improved. Plan to taper slowly back to baseline dose of 5mg .   - Vitamin C, zinc - Encourage OOB, IS, FV, and awake proning if  able - Tylenol and antitussives prn - Continue airborne, contact precautions for 21 days from positive testing. - D-dimer remains elevated >2 in hospitalized covid patient with limited mobility. Continue lovenox 0.5mg /kg q12h for now. Can hold if recommended by pulmonary.  Acute on chronic HFpEF, HTN: Echo showing EF 55-60%, G1DD, no significant valvular abnormalities and no definite wall motion abnormalities on technically difficult study.  - Continue to hold lasix today since he's appearing euvolemic, below dry weight. Restart lasix tmrw. - Monitor I/O (inadequately documented), daily weights, BMP - Holding ACEi as below  Hyperkalemia: Chronic problem per patient. Appears stable. - Continue lokelma, cardiac monitoring, daily K monitoring. - Telemetry showing stability, no peaked t waves, QTc 453msec on my personal review again today.  - Holding lisinopril. Discussed with PCP by phone 2/19, will plan to hold at discharge until PCP follow up.   Facial edema: Due to dissection of subcutaneous air.   Stage IIIb CKD s/p renal transplant:  - Continue anti-rejection medications: mycophenolate 500mg  BID, tacrolimus 1.5mg  BID.  - Avoid nephrotoxins as much as possible including contrast and NSAIDs.  IDT2DM: Poorly-controlled with hyperglycemia and hypoglycemia on 2/11. HbA1c 8.5%.  - Continue lantus 24u daily, Home dose tresiba is 20u daily.  - Linagliptin sub for home trulicity.  - Control improving overall, will not augment with decreased steroid dosing.  HLD:  - Continue statin  Supratherapeutic tacrolimus level: Dosing per pharmacy.  Chorea, unspecified: Noted, chronic, followed by neurology, Dr. Vincenza Hews.  At baseline.Recently changed from tetrabenazine to deutetrabenazine due toinsurance issues/coverage/prior auth per recent neurology note. No consistent evidence of either facial/alternative  site swelling nor hyperkalemia associated with this.  - Continue deutetrabenazine.    Overweight: Estimated body mass index is 27.33 kg/m as calculated from the following:   Height as of this encounter: 5\' 11"  (1.803 m).   Weight as of this encounter: 88.9 kg.  Chronic hepatitis C:  - Monitor LFTs intermittently (currently wnl)  Hypothyroidism: TSH 0.190 recently with free T4 1.36.  - Continue synthroid, will need recheck TFTs. Not clinically hyperthyroid at this time.   DVT prophylaxis: Lovenox Code Status: Full Family Communication: None at bedside Disposition Plan: Transfer to ICU today.    Consultants:   Pulmonary critical care medicine  Procedures:   None  Antimicrobials:  Remdesivir  Cefepime   Subjective: Feels about the same today, still very winded with exertion associated with hypoxemia. He does not admit to chest pain. Swelling in chin/neck is stable, not bothersome.  Objective: Vitals:   06/12/19 1920 06/13/19 0025 06/13/19 0532 06/13/19 0752  BP: (!) 138/91 120/62 (!) 125/92 (!) 145/92  Pulse: 82 73 85 69  Resp: (!) 23 (!) 22 17 (!) 28  Temp: 97.7 F (36.5 C) 97.9 F (36.6 C) 98 F (36.7 C) (!) 97.5 F (36.4 C)  TempSrc: Axillary Axillary Oral Axillary  SpO2: 98% 95% 96% 91%  Weight:      Height:        Intake/Output Summary (Last 24 hours) at 06/13/2019 0900 Last data filed at 06/13/2019 0500 Gross per 24 hour  Intake 990 ml  Output 425 ml  Net 565 ml   Filed Weights   05/31/19 1131 06/11/19 0500 06/12/19 0539  Weight: 91.2 kg 88.6 kg 88.9 kg   Gen: 64 y.o. male in no distress Pulm: Nonlabored tachypnea at rest. Crackles noted. CV: Regular rate and rhythm. No murmur, rub, or gallop. No JVD, no dependent edema. GI: Abdomen soft, non-tender, non-distended, with normoactive bowel sounds.  Ext: Warm, no deformities Skin: No new rashes, lesions or ulcers on visualized skin. Diffuse palpable subcutaneous emphysema across anterior chest wall extending up to neck. No erythema, plethora, etc. Neuro: Alert and oriented. Less  choreiform movements this morning. No focal neurological deficits. Psych: Judgement and insight appear intact. Mood euthymic & affect congruent. Behavior is appropriate.     Data Reviewed: I have personally reviewed following labs and imaging studies  CBC: Recent Labs  Lab 06/07/19 0450 06/08/19 0317 06/11/19 0302  WBC 9.7 10.9* 12.7*  HGB 11.8* 11.4* 12.3*  HCT 35.6* 34.3* 38.1*  MCV 89.2 89.8 90.5  PLT 328 323 456   Basic Metabolic Panel: Recent Labs  Lab 06/09/19 0415 06/10/19 0334 06/11/19 0302 06/12/19 0302 06/13/19 0055  NA 135 135 136 135 132*  K 4.9 5.4* 5.2* 5.2* 5.1  CL 107 107 106 103 100  CO2 19* 19* 22 24 24   GLUCOSE 180* 191* 171* 272* 228*  BUN 79* 83* 76* 77* 75*  CREATININE 1.85* 1.61* 1.73* 2.09* 1.81*  CALCIUM 8.3* 8.5* 8.6* 8.6* 8.3*   GFR: Estimated Creatinine Clearance: 43.3 mL/min (A) (by C-G formula based on SCr of 1.81 mg/dL (H)). Liver Function Tests: Recent Labs  Lab 06/09/19 0415 06/10/19 0334  AST 12* 15  ALT 16 17  ALKPHOS 85 78  BILITOT 0.4 0.7  PROT 5.2* 5.0*  ALBUMIN 2.4* 2.5*   No results for input(s): LIPASE, AMYLASE in the last 168 hours. No results for input(s): AMMONIA in the last 168 hours. Coagulation Profile: No results for input(s): INR, PROTIME in the last 168 hours.  Cardiac Enzymes: No results for input(s): CKTOTAL, CKMB, CKMBINDEX, TROPONINI in the last 168 hours. BNP (last 3 results) No results for input(s): PROBNP in the last 8760 hours. HbA1C: No results for input(s): HGBA1C in the last 72 hours. CBG: Recent Labs  Lab 06/12/19 1159 06/12/19 1550 06/12/19 1634 06/12/19 2055 06/13/19 0758  GLUCAP 166* 268* 302* 285* 182*   Lipid Profile: No results for input(s): CHOL, HDL, LDLCALC, TRIG, CHOLHDL, LDLDIRECT in the last 72 hours. Thyroid Function Tests: No results for input(s): TSH, T4TOTAL, FREET4, T3FREE, THYROIDAB in the last 72 hours. Anemia Panel: No results for input(s): VITAMINB12, FOLATE,  FERRITIN, TIBC, IRON, RETICCTPCT in the last 72 hours. Urine analysis: No results found for: COLORURINE, APPEARANCEUR, LABSPEC, PHURINE, GLUCOSEU, HGBUR, BILIRUBINUR, KETONESUR, PROTEINUR, UROBILINOGEN, NITRITE, LEUKOCYTESUR No results found for this or any previous visit (from the past 240 hour(s)).    Radiology Studies: DG CHEST PORT 1 VIEW  Result Date: 06/13/2019 CLINICAL DATA:  Acute respiratory failure with hypoxia. COVID 19 positive. EXAM: PORTABLE CHEST 1 VIEW COMPARISON:  05/31/2019 chest radiograph. FINDINGS: Extensive subcutaneous emphysema throughout bilateral chest wall and lower neck, new. Stable cardiomediastinal silhouette with normal heart size. New small to moderate right pneumothorax, approximately 10-15%. No mediastinal shift. No left pneumothorax. No pleural effusion. Patchy opacities in the mid to lower lungs bilaterally appear worsened. IMPRESSION: 1. New small to moderate right pneumothorax, approximately 10-15%. No mediastinal shift. 2. New extensive subcutaneous emphysema throughout bilateral chest wall and lower neck. 3. Worsening patchy opacities in the mid to lower lungs bilaterally, compatible with COVID-19 pneumonia. Critical Value/emergent results were called by telephone at the time of interpretation on 06/13/2019 at 7:02 am to provider Vance Gather , who verbally acknowledged these results. Electronically Signed   By: Ilona Sorrel M.D.   On: 06/13/2019 07:08    Scheduled Meds: . vitamin C  500 mg Oral Daily  . atorvastatin  10 mg Oral q1800  . Deutetrabenazine  6 mg Oral BID  . enoxaparin (LOVENOX) injection  0.5 mg/kg Subcutaneous Q12H  . insulin aspart  0-4 Units Subcutaneous QHS  . insulin aspart  0-9 Units Subcutaneous TID WC  . insulin glargine  24 Units Subcutaneous Daily  . Ipratropium-Albuterol  1 puff Inhalation Q6H  . levothyroxine  100 mcg Oral QAC breakfast  . linagliptin  5 mg Oral Daily  . predniSONE  20 mg Oral Q breakfast  . sodium zirconium  cyclosilicate  10 g Oral BID  . tacrolimus  1.5 mg Oral BID  . zinc sulfate  220 mg Oral Daily   Continuous Infusions:   LOS: 13 days   Time spent: 35 minutes.  Patrecia Pour, MD [facility] [email or web address] 06/13/2019, 9:00 AM

## 2019-06-13 NOTE — Consult Note (Signed)
NAME:  Nathan Tyler, MRN:  [id number], DOB:  Apr 04, 1956, LOS: 33 ADMISSION DATE:  05/31/2019, CONSULTATION DATE:  2/20 REFERRING MD:  Bonner Puna, CHIEF COMPLAINT:  dyspnea   Brief History   64 y/o male admitted on 2/7 with severe acute respiratory failure with hypoxemia, traeted with remdesivir, steroids.  Has had pulmonary edema treated with steroids, needed antibiotics during his hosptial stay for possible HCAP.  He developed a R pneumothorax on 2/20 so pulmonary was consulted.   History of present illness   64 y/o male admitted on 2/7 with severe acute respiratory failure with hypoxemia, traeted with remdesivir, steroids.  Has had pulmonary edema treated with steroids, needed antibiotics during his hosptial stay for possible HCAP.  He developed a R pneumothorax on 2/20 so pulmonary was consulted.   He tells me that he feels a little short of breath, but in general his symptoms are not too bad.  He developed subcutaneous air this week and was noted to have a pneumothorax today.  Per my request he was moved to the ICU.  Denies chest pain. His oxygen needs have been improving significantly this admission.    Past Medical History  Renal transplant  DM2 Chronic diastolic heart failure HLD Chorea Chronic hepatitis C Hypothyroidism  Significant Hospital Events   2/7 admission 2/19 sub cutaneous air in neck 2/20 move to ICU for R chest tube  Consults:  PCCM  Procedures:  R pigtail chest tube 2/20 >   Significant Diagnostic Tests:  2/10 CT angiogram chest > diffuse bilateral airspace disease, mild fibrotic looking changes in bases, no pneumothorax, no PE 2/15 Echo > LVEF 55-605, normal size function of RV, valves OK  Micro Data:    Antimicrobials:  2/7 remdesivir > 2/11 2/11 cefepime > 2/14  Interim history/subjective:    Objective   Blood pressure 137/82, pulse 74, temperature 98.3 F (36.8 C), temperature source Axillary, resp. rate (!) 31, height 5\' 11"  (1.803 m), weight 88.9  kg, SpO2 100 %.        Intake/Output Summary (Last 24 hours) at 06/13/2019 1303 Last data filed at 06/13/2019 0500 Gross per 24 hour  Intake 990 ml  Output 425 ml  Net 565 ml   Filed Weights   05/31/19 1131 06/11/19 0500 06/12/19 0539  Weight: 91.2 kg 88.6 kg 88.9 kg    Examination:  General:  Resting comfortably in bed HENT: NCAT OP clear PULM: Diminished on R B, normal effort CV: RRR, no mgr GI: BS+, soft, nontender MSK: normal bulk and tone Neuro: awake, alert, no distress, MAEW  2/20   Resolved Hospital Problem list     Assessment & Plan:  COVID pneumonia with lung injury, improving oxygenation but now with pneumothorax > move to ICU for chest tube placement > place pigtail chest tube catheter to R lung > suction -20cm overnight > flush with saline  Best practice:   Per [facility]  Labs   CBC: Recent Labs  Lab 06/07/19 0450 06/08/19 0317 06/11/19 0302  WBC 9.7 10.9* 12.7*  HGB 11.8* 11.4* 12.3*  HCT 35.6* 34.3* 38.1*  MCV 89.2 89.8 90.5  PLT 328 323 626    Basic Metabolic Panel: Recent Labs  Lab 06/09/19 0415 06/10/19 0334 06/11/19 0302 06/12/19 0302 06/13/19 0055  NA 135 135 136 135 132*  K 4.9 5.4* 5.2* 5.2* 5.1  CL 107 107 106 103 100  CO2 19* 19* 22 24 24   GLUCOSE 180* 191* 171* 272* 228*  BUN 79* 83* 76*  77* 75*  CREATININE 1.85* 1.61* 1.73* 2.09* 1.81*  CALCIUM 8.3* 8.5* 8.6* 8.6* 8.3*   GFR: Estimated Creatinine Clearance: 43.3 mL/min (A) (by C-G formula based on SCr of 1.81 mg/dL (H)). Recent Labs  Lab 06/07/19 0450 06/08/19 0317 06/11/19 0302  WBC 9.7 10.9* 12.7*    Liver Function Tests: Recent Labs  Lab 06/09/19 0415 06/10/19 0334  AST 12* 15  ALT 16 17  ALKPHOS 85 78  BILITOT 0.4 0.7  PROT 5.2* 5.0*  ALBUMIN 2.4* 2.5*   No results for input(s): LIPASE, AMYLASE in the last 168 hours. No results for input(s): AMMONIA in the last 168 hours.  ABG No results found for: PHART, PCO2ART, PO2ART, HCO3, TCO2, ACIDBASEDEF,  O2SAT   Coagulation Profile: No results for input(s): INR, PROTIME in the last 168 hours.  Cardiac Enzymes: No results for input(s): CKTOTAL, CKMB, CKMBINDEX, TROPONINI in the last 168 hours.  HbA1C: Hgb A1c MFr Bld  Date/Time Value Ref Range Status  05/31/2019 01:00 PM 8.5 (H) 4.8 - 5.6 % Final    Comment:    (NOTE) Pre diabetes:          5.7%-6.4% Diabetes:              >6.4% Glycemic control for   <7.0% adults with diabetes   08/25/2015 02:45 PM 9.4 (H) 4.8 - 5.6 % Final    Comment:    (NOTE)         Pre-diabetes: 5.7 - 6.4         Diabetes: >6.4         Glycemic control for adults with diabetes: <7.0     CBG: Recent Labs  Lab 06/12/19 1550 06/12/19 1634 06/12/19 2055 06/13/19 0758 06/13/19 1107  GLUCAP 268* 302* 285* 182* 139*    Review of Systems:   Gen: Denies fever, chills, weight change, fatigue, night sweats HEENT: Denies blurred vision, double vision, hearing loss, tinnitus, sinus congestion, rhinorrhea, sore throat, neck stiffness, dysphagia PULM: per HPI CV: Denies chest pain, edema, orthopnea, paroxysmal nocturnal dyspnea, palpitations GI: Denies abdominal pain, nausea, vomiting, diarrhea, hematochezia, melena, constipation, change in bowel habits GU: Denies dysuria, hematuria, polyuria, oliguria, urethral discharge Endocrine: Denies hot or cold intolerance, polyuria, polyphagia or appetite change Derm: Denies rash, dry skin, scaling or peeling skin change Heme: Denies easy bruising, bleeding, bleeding gums Neuro: Denies headache, numbness, weakness, slurred speech, loss of memory or consciousness    Past Medical History  He,  has a past medical history of Chorea, Diabetes mellitus without complication ([place]), Hypercholesteremia, Hypothyroidism, Neuromuscular disorder ([place]), PONV (postoperative nausea and vomiting), and Renal failure.   Surgical History    Past Surgical History:  Procedure Laterality Date  . AMPUTATION Right 06/27/2016    Procedure: AMPUTATION 5TH TOE RIGHT FOOT;  Surgeon: Caprice Beaver, DPM;  Location: [facility];  Service: Podiatry;  Laterality: Right;  . AMPUTATION TOE Left 09/02/2015   Procedure: AMPUTATION  2ND TOE LEFT FOOT;  Surgeon: Aviva Signs, MD;  Location: [facility];  Service: General;  Laterality: Left;  . CATARACT EXTRACTION Bilateral   . COLONOSCOPY  01/2014   done in [place]. patient reports h/o colon polyps on TCS in 2010, 2015 colonoscopy normal but advised to come back in 2020.   Marland Kitchen COLONOSCOPY WITH PROPOFOL N/A 01/19/2019   Procedure: COLONOSCOPY WITH PROPOFOL;  Surgeon: Daneil Dolin, MD;  Location: [facility];  Service: Endoscopy;  Laterality: N/A;  9:45am  . KIDNEY TRANSPLANT Right 2010  . POLYPECTOMY  01/19/2019   Procedure: POLYPECTOMY;  Surgeon: Daneil Dolin, MD;  Location: [facility];  Service: Endoscopy;;     Social History   reports that he has never smoked. He has never used smokeless tobacco. He reports current alcohol use. He reports that he does not use drugs.   Family History   His family history includes Colon cancer (age of onset: 4) in his maternal aunt.   Allergies No Known Allergies   Home Medications  Prior to Admission medications   Medication Sig Start Date End Date Taking? Authorizing Provider  atorvastatin (LIPITOR) 10 MG tablet Take 10 mg by mouth daily.   Yes [provider]  Deutetrabenazine (AUSTEDO) 6 MG TABS Take 6 mg by mouth 2 (two) times daily.   Yes [provider]  Dulaglutide (TRULICITY) 1.5 VQ/2.5ZD SOPN Inject 1.5 mg into the skin every 7 (seven) days.    Yes [provider]  levothyroxine (SYNTHROID, LEVOTHROID) 100 MCG tablet Take 100 mcg by mouth daily before breakfast. 06/06/16  Yes [provider]  lisinopril (ZESTRIL) 20 MG tablet Take 20 mg by mouth daily.    Yes [provider]  mycophenolate (CELLCEPT) 500 MG tablet Take 500 mg by mouth 2 (two) times daily.   Yes [provider]    predniSONE (DELTASONE) 5 MG tablet Take 5 mg by mouth daily with breakfast.   Yes [provider]  tacrolimus (PROGRAF) 0.5 MG capsule Take 1.5 mg by mouth 2 (two) times daily.    Yes [provider]  tetrabenazine (XENAZINE) 12.5 MG tablet Take 12.5-25 mg by mouth See admin instructions. Takes 25 mg by mouth in the morning and 12.5 mg at night. 10/18/17  Yes [provider]  TRESIBA FLEXTOUCH 200 UNIT/ML SOPN Inject 20 Units into the skin daily.  04/25/16  Yes [provider]  furosemide (LASIX) 20 MG tablet Take 20 mg by mouth daily.     [provider]     Critical care time: n/a     Roselie Awkward, MD [place] [facility] Pager: [phone] Cell: [phone] If no response, call [phone]

## 2019-06-13 NOTE — Progress Notes (Signed)
OT Cancellation Note  Patient Details Name: LENOARD HELBERT MRN: [id number] DOB: 10-01-1955   Cancelled Treatment:    Reason Eval/Treat Not Completed: Patient at procedure or test/ unavailable;Medical issues which prohibited therapy.  MD requesting hold.  Patient waiting on procedure today for a chest tube placement.  Will follow up once stable.   [name], OTR/L   Phylliss Bob 06/13/2019, 2:37 PM

## 2019-06-14 ENCOUNTER — Inpatient Hospital Stay (HOSPITAL_COMMUNITY): Payer: Medicare Other

## 2019-06-14 LAB — BASIC METABOLIC PANEL
Anion gap: 7 (ref 5–15)
BUN: 61 mg/dL — ABNORMAL HIGH (ref 8–23)
CO2: 24 mmol/L (ref 22–32)
Calcium: 8.8 mg/dL — ABNORMAL LOW (ref 8.9–10.3)
Chloride: 106 mmol/L (ref 98–111)
Creatinine, Ser: 1.53 mg/dL — ABNORMAL HIGH (ref 0.61–1.24)
GFR calc Af Amer: 54 mL/min — ABNORMAL LOW (ref >60)
GFR calc non Af Amer: 47 mL/min — ABNORMAL LOW (ref >60)
Glucose, Bld: 205 mg/dL — ABNORMAL HIGH (ref 70–99)
Potassium: 5.5 mmol/L — ABNORMAL HIGH (ref 3.5–5.1)
Sodium: 137 mmol/L (ref 135–145)

## 2019-06-14 LAB — C-REACTIVE PROTEIN: CRP: 0.8 mg/dL (ref <1.0)

## 2019-06-14 LAB — GLUCOSE, CAPILLARY
Glucose-Capillary: 163 mg/dL — ABNORMAL HIGH (ref 70–99)
Glucose-Capillary: 61 mg/dL — ABNORMAL LOW (ref 70–99)
Glucose-Capillary: 275 mg/dL — ABNORMAL HIGH (ref 70–99)

## 2019-06-14 LAB — TACROLIMUS LEVEL: Tacrolimus (FK506) - LabCorp: 5.8 ng/mL (ref 2.0–20.0)

## 2019-06-14 LAB — D-DIMER, QUANTITATIVE: D-Dimer, Quant: 1.48 ug/mL-FEU — ABNORMAL HIGH (ref 0.00–0.50)

## 2019-06-14 MED ORDER — PREDNISONE 10 MG PO TABS
10.0000 mg | ORAL_TABLET | Freq: Every day | ORAL | Status: DC
  Administered 2019-06-15 – 2019-06-16 (×2): 10 mg via ORAL
  Filled 2019-06-14 (×2): qty 1

## 2019-06-14 MED ORDER — INSULIN ASPART 100 UNIT/ML ~~LOC~~ SOLN
0.0000 [IU] | Freq: Three times a day (TID) | SUBCUTANEOUS | Status: DC
  Administered 2019-06-14: 16:00:00 5 [IU] via SUBCUTANEOUS
  Administered 2019-06-14 – 2019-06-15 (×2): 3 [IU] via SUBCUTANEOUS
  Administered 2019-06-15: 12:00:00 8 [IU] via SUBCUTANEOUS
  Administered 2019-06-15: 3 [IU] via SUBCUTANEOUS
  Administered 2019-06-16: 17:00:00 2 [IU] via SUBCUTANEOUS
  Administered 2019-06-16: 3 [IU] via SUBCUTANEOUS

## 2019-06-14 NOTE — Progress Notes (Signed)
PROGRESS NOTE  GARI HARTSELL  JKD:[id number] DOB: 07-20-1955 DOA: 05/31/2019 PCP: Celene Squibb, MD   Brief Narrative: MJ WILLIS is a 64 y.o. male with a history of renal transplant, CKD, chronic hepatitis C, IDT2DM, HTN, hypothyroidism, and chorea who was diagnosed with covid-19 by his PCP on 2/5 and directly admitted to [facility] on 2/7 due to hypoxemia. Remdesivir and IV steroids were given with initial improvement followed by abrupt worsening on 2/10 for which antibiotics were started. Hypoxemia has continued with evidence of pulmonary edema on CXR, echocardiogram showed grade 1 diastolic dysfunction. Home lasix was agumented to lasix 20mg  IV BID for several days with good response, held on 2/19 due to creatinine bump. CXR repeated 2/20 showing right-sided PTX and diffuse subcutaneous air. PCM consulted, patient transferred to ICU for chest tube insertion.  Assessment & Plan: Principal Problem:   Acute respiratory failure with hypoxia (HCC) Active Problems:   HCV infection   COVID-19 virus infection   Essential hypertension   CKD (chronic kidney disease)   Renal transplant, status post   HLD (hyperlipidemia)   H/O insulin dependent diabetes mellitus   Chorea   Overweight (BMI 25.0-29.9)   Hypothyroidism  Acute hypoxemic respiratory failure due to covid-19 pneumonia with superimposed bacterial pneumonia, acute CHF, pneumothorax: SARS-CoV-2 positive on 2/5. Remains hypoxemic. - s/p right pigtail chest tube 2/20. Follow serial CXR, improved. - Appears to be euvolemic, still down from 220lbs documented at outpatient neurology visit Nov 2020. Weights not consistent. - Completed remdesivir (2/7 - 2/11), cefepime (2/10 - 2/14)   - Tapered IV decadron 6mg  > 4mg  2/17 > Prednisone 20mg  2/19 > 10mg  2/21 taper further today. CRP has normalized. Plan to taper slowly back to baseline dose of 5mg .   - Vitamin C, zinc - Encourage OOB, IS, FV, and awake proning if able - Tylenol and antitussives prn -  Continue airborne, contact precautions for 21 days from positive testing. - D-dimer remains elevated. Continue lovenox 0.5mg /kg q12h for now. Can hold if recommended by pulmonary.  Acute on chronic HFpEF, HTN: Echo showing EF 55-60%, G1DD, no significant valvular abnormalities and no definite wall motion abnormalities on technically difficult study.  - Restart po lasix, currently euvolemic - Monitor I/O, daily weights, BMP - Holding ACEi as below  Hyperkalemia: Chronic problem per patient. Appears stable. - Continue lokelma, cardiac monitoring, daily K monitoring. - Holding lisinopril. Discussed with PCP by phone 2/19, will plan to hold at discharge until PCP follow up.    Subcutaneous air:  - Noted, will monitor.  Stage IIIb CKD s/p renal transplant:  - Will restart lasix with elevated K, CrCl has improved since holding lasix.  - Continue anti-rejection medications: mycophenolate 500mg  BID, tacrolimus 1.5mg  BID.  - Avoid nephrotoxins as much as possible including contrast and NSAIDs.  IDT2DM: Poorly-controlled with hyperglycemia and hypoglycemia on 2/11. HbA1c 8.5%.  - Continue lantus 24u daily, Home dose tresiba is 20u daily.  - Linagliptin sub for home trulicity.  - Will augment SSI due to elevated postprandials.   HLD:  - Continue statin  Supratherapeutic tacrolimus level: Dosing per pharmacy. - Will need monitoring as outpatient. PCP reports nephrology manages this.  Chorea, unspecified: Noted, chronic, followed by neurology, Dr. Vincenza Hews.  At baseline.Recently changed from tetrabenazine to deutetrabenazine due toinsurance issues/coverage/prior auth per recent neurology note. No consistent evidence of either facial/alternative site swelling nor hyperkalemia associated with this.  - Continue deutetrabenazine.   Overweight: Estimated body mass index is 28.59 kg/m as calculated  from the following:   Height as of this encounter: 5\' 11"  (1.803 m).   Weight as of this encounter: 93  kg.  Chronic hepatitis C:  - Monitor LFTs intermittently (currently wnl)  Hypothyroidism: TSH 0.190 recently with free T4 1.36.  - Continue synthroid, will need recheck TFTs. Not clinically hyperthyroid at this time.   DVT prophylaxis: Lovenox 0.5mg /kg q12h Code Status: Full Family Communication: None at bedside Disposition Plan: Transfer ICU > PCU after 24 hours observation in ICU today.  Consultants:   Pulmonary critical care medicine  Procedures:   Right chest tube insertion 06/13/2019.   Antimicrobials:  Remdesivir  Cefepime   Subjective: Breathing is stable-to-improved, shortness of breath moderate with exertion, nearly absent at rest. No significant chest pain reported. Swelling in chest/neck improved. No bleeding reported. No palpitations.  Objective: Vitals:   06/14/19 0604 06/14/19 0615 06/14/19 0623 06/14/19 0701  BP:    136/86  Pulse:    68  Resp:    20  Temp:      TempSrc:      SpO2: (!) 89% 91% 93% 97%  Weight:      Height:        Intake/Output Summary (Last 24 hours) at 06/14/2019 0719 Last data filed at 06/14/2019 0000 Gross per 24 hour  Intake 750 ml  Output 510 ml  Net 240 ml   Filed Weights   06/11/19 0500 06/12/19 0539 06/14/19 0500  Weight: 88.6 kg 88.9 kg 93 kg   Gen: 64 y.o. male in no distress Pulm: Nonlabored tachypnea breathing supplemental oxygen. Bilateral crackles. No evidence of air leak. CV: Regular rate and rhythm. No murmur, rub, or gallop. No JVD, no dependent edema. GI: Abdomen soft, non-tender, non-distended, with normoactive bowel sounds.  Ext: Warm, no deformities Skin: No new rashes, lesions or ulcers on visualized skin. Crepitance across chest, neck stable. Swelling improved. Neuro: Alert and oriented. No focal neurological deficits. Psych: Judgement and insight appear fair. Mood euthymic & affect congruent. Behavior is appropriate.    Data Reviewed: I have personally reviewed following labs and imaging  studies  CBC: Recent Labs  Lab 06/08/19 0317 06/11/19 0302  WBC 10.9* 12.7*  HGB 11.4* 12.3*  HCT 34.3* 38.1*  MCV 89.8 90.5  PLT 323 782   Basic Metabolic Panel: Recent Labs  Lab 06/10/19 0334 06/11/19 0302 06/12/19 0302 06/13/19 0055 06/14/19 0530  NA 135 136 135 132* 137  K 5.4* 5.2* 5.2* 5.1 5.5*  CL 107 106 103 100 106  CO2 19* 22 24 24 24   GLUCOSE 191* 171* 272* 228* 205*  BUN 83* 76* 77* 75* 61*  CREATININE 1.61* 1.73* 2.09* 1.81* 1.53*  CALCIUM 8.5* 8.6* 8.6* 8.3* 8.8*   Liver Function Tests: Recent Labs  Lab 06/09/19 0415 06/10/19 0334  AST 12* 15  ALT 16 17  ALKPHOS 85 78  BILITOT 0.4 0.7  PROT 5.2* 5.0*  ALBUMIN 2.4* 2.5*   CBG: Recent Labs  Lab 06/12/19 2055 06/13/19 0758 06/13/19 1107 06/13/19 1630 06/13/19 2058  GLUCAP 285* 182* 139* 230* 293*   Recent Results (from the past 240 hour(s))  MRSA PCR Screening     Status: None   Collection Time: 06/13/19  9:31 AM   Specimen: Nasal Mucosa; Nasopharyngeal  Result Value Ref Range Status   MRSA by PCR NEGATIVE NEGATIVE Final    Comment:        The GeneXpert MRSA Assay (FDA approved for NASAL specimens only), is one component of a comprehensive  MRSA colonization surveillance program. It is not intended to diagnose MRSA infection nor to guide or monitor treatment for MRSA infections. Performed at [facility], [place] [address] [place], [place] [address]       Radiology Studies: DG CHEST PORT 1 VIEW  Result Date: 06/14/2019 CLINICAL DATA:  Status post right chest tube placement EXAM: PORTABLE CHEST 1 VIEW COMPARISON:  Chest radiograph from one day prior. FINDINGS: Right basilar pigtail chest tube is in place. Stable cardiomediastinal silhouette with normal heart size. Tiny residual right apical pneumothorax appears unchanged. No left pneumothorax. No mediastinal shift. No pleural effusion. Patchy opacities throughout the mid to lower lungs bilaterally are unchanged.  Extensive subcutaneous emphysema throughout the lower neck and bilateral chest wall, unchanged. IMPRESSION: 1. Tiny residual right apical pneumothorax, unchanged. 2. Stable patchy opacities throughout the mid to lower lungs bilaterally compatible with COVID-19 pneumonia. 3. Stable extensive subcutaneous emphysema throughout the lower neck and bilateral chest wall. Electronically Signed   By: Ilona Sorrel M.D.   On: 06/14/2019 05:25   DG CHEST PORT 1 VIEW  Result Date: 06/13/2019 CLINICAL DATA:  Status post chest tube placement EXAM: PORTABLE CHEST 1 VIEW COMPARISON:  June 13, 2019 FINDINGS: There is been interval placement of a right-sided chest tube. The right-sided pneumothorax is no longer well appreciated. There may be a trace residual right-sided pneumothorax. Extensive subcutaneous emphysema is again noted and is similar to prior study. Again noted are multifocal airspace opacities bilaterally. The heart size is stable. IMPRESSION: 1. Trace residual right-sided pneumothorax status post placement of a right-sided chest tube. 2. Persistent diffuse subcutaneous emphysema. 3. No convincing left-sided pneumothorax. 4. Persistent diffuse bilateral airspace opacities are again noted. Electronically Signed   By: Constance Holster M.D.   On: 06/13/2019 18:39   DG CHEST PORT 1 VIEW  Result Date: 06/13/2019 CLINICAL DATA:  Acute respiratory failure with hypoxia. COVID 19 positive. EXAM: PORTABLE CHEST 1 VIEW COMPARISON:  05/31/2019 chest radiograph. FINDINGS: Extensive subcutaneous emphysema throughout bilateral chest wall and lower neck, new. Stable cardiomediastinal silhouette with normal heart size. New small to moderate right pneumothorax, approximately 10-15%. No mediastinal shift. No left pneumothorax. No pleural effusion. Patchy opacities in the mid to lower lungs bilaterally appear worsened. IMPRESSION: 1. New small to moderate right pneumothorax, approximately 10-15%. No mediastinal shift. 2. New  extensive subcutaneous emphysema throughout bilateral chest wall and lower neck. 3. Worsening patchy opacities in the mid to lower lungs bilaterally, compatible with COVID-19 pneumonia. Critical Value/emergent results were called by telephone at the time of interpretation on 06/13/2019 at 7:02 am to provider Vance Gather , who verbally acknowledged these results. Electronically Signed   By: Ilona Sorrel M.D.   On: 06/13/2019 07:08    Scheduled Meds:  vitamin C  500 mg Oral Daily   atorvastatin  10 mg Oral q1800   chlorhexidine  15 mL Mouth Rinse BID   Chlorhexidine Gluconate Cloth  6 each Topical Daily   Deutetrabenazine  6 mg Oral BID   enoxaparin (LOVENOX) injection  0.5 mg/kg Subcutaneous Q12H   furosemide  20 mg Oral Daily   insulin aspart  0-4 Units Subcutaneous QHS   insulin aspart  0-9 Units Subcutaneous TID WC   insulin glargine  24 Units Subcutaneous Daily   Ipratropium-Albuterol  1 puff Inhalation Q6H   levothyroxine  100 mcg Oral QAC breakfast   linagliptin  5 mg Oral Daily   mouth rinse  15 mL Mouth Rinse q12n4p   predniSONE  20 mg Oral Q breakfast   sodium zirconium cyclosilicate  10 g Oral BID   tacrolimus  1.5 mg Oral BID   zinc sulfate  220 mg Oral Daily   Continuous Infusions:   LOS: 14 days   Time spent: 35 minutes.  Patrecia Pour, MD [facility] [email or web address] 06/14/2019, 7:19 AM

## 2019-06-14 NOTE — Progress Notes (Signed)
Pt. Arrived at room 136 with RN. Report received at bedside. Vitals and assessment completed. Orders checked. Will continuo to monitor.

## 2019-06-14 NOTE — Progress Notes (Signed)
Occupational Therapy Treatment Patient Details Name: Nathan Tyler MRN: [id number] DOB: 1956/03/26 Today's Date: 06/14/2019    History of present illness Pt is a 64 y.o. male directly admitted from [facility] on 05/31/19 with progressive SOB, found to be hypoxic; pt tested (+) COVID-19 on 2/5. PMH includes neuromuscular disorder, chorea, DM, CKD (s/p renal transplant 2010), R foot 5th and L 2nd toe amputations.   OT comments  Pt making progress in therapy, requiring less supplemental oxygen as compared to previous sessions. Pt currently on 2.5L [place] (2 extensions) with SpO2 96% at rest. Pt recently transferred to ICU following chest tube placement. No significant decline noted following procedure. Pt able to ambulate to/from bathroom with RW and min guard to ensure balance and safety. Noted 0 instances of loss of balance with pt requiring mod cues for safety and pacing. SpO2 maintained in 90s on 2.5L [place] during toileting task. Pt tolerated standing 1 x 7 min at the sink to complete grooming and sponge bathing task on room air. SpO2 dropped to 82% during activity with pt unable to increase. 2.5L  donned with SpO2 increasing to 90s within ~1 min. Pt reported min to mod shortness of breath throughout. Continued education with pt on safety strategies, energy conservation, fall prevention, and pursed lip breathing with good recall from previous sessions. OT will continue to follow acutely.    Follow Up Recommendations  Home health OT;Supervision/Assistance - 24 hour    Equipment Recommendations  3 in 1 bedside commode    Recommendations for Other Services      Precautions / Restrictions Precautions Precautions: Fall Precaution Comments: chorea, monitor SpO2 Restrictions Weight Bearing Restrictions: No       Mobility Bed Mobility               General bed mobility comments: Pt seated in bedside chair upon OT arrival.   Transfers Overall transfer level: Needs  assistance Equipment used: Rolling walker (2 wheeled) Transfers: Sit to/from Stand Sit to Stand: Supervision              Balance Overall balance assessment: Needs assistance Sitting-balance support: Feet supported Sitting balance-Leahy Scale: Good       Standing balance-Leahy Scale: Fair                             ADL either performed or assessed with clinical judgement   ADL Overall ADL's : Needs assistance/impaired     Grooming: Wash/dry hands;Wash/dry face;Oral care;Supervision/safety;Standing   Upper Body Bathing: Supervision/ safety;Set up;Standing   Lower Body Bathing: Supervison/ safety;Set up;Sit to/from stand           Toilet Transfer: Sales executive;Ambulation;Grab bars   Toileting- Clothing Manipulation and Hygiene: Supervision/safety;Sit to/from stand       Functional mobility during ADLs: Min guard;Rolling walker General ADL Comments: Pt able to ambulate to/from bathroom with RW and min guard. Noted 0 instances of LOB, however pt unsteady on feet.      Vision       Perception     Praxis      Cognition Arousal/Alertness: Awake/alert Behavior During Therapy: WFL for tasks assessed/performed Overall Cognitive Status: No family/caregiver present to determine baseline cognitive functioning                                 General Comments: Very motivated to participate in therapy  Exercises Exercises: Other exercises Other Exercises Other Exercises: Encouraged pursed lip breathing throughout   Shoulder Instructions       General Comments Pt on 2.5L (2 extensions) with SpO2 96% at rest. SpO2 maintained in 90s on 2.5L during toileting task. SpO2 dropped to 82% on room air during standing task with pt requiring ~1 min seated recovery to return to 90s on 2.5 L [place]. Pt reported min to mod SOB throughout.    Pertinent Vitals/ Pain       Pain Assessment: No/denies pain  Home Living                                           Prior Functioning/Environment              Frequency           Progress Toward Goals  OT Goals(current goals Nathan now be found in the care plan section)  Progress towards OT goals: Progressing toward goals  ADL Goals Pt Will Perform Grooming: with modified independence;standing Pt Will Perform Upper Body Bathing: Independently;sitting Pt Will Transfer to Toilet: with modified independence;ambulating Additional ADL Goal #1: Patient will maintain SpO2>90 during standing ADLs. Additional ADL Goal #2: Patient will independently initiate and perform pursed lip breathing during ADLs.  Plan Discharge plan remains appropriate    Co-evaluation                 AM-PAC OT "6 Clicks" Daily Activity     Outcome Measure   Help from another person eating meals?: None Help from another person taking care of personal grooming?: A Little Help from another person toileting, which includes using toliet, bedpan, or urinal?: A Little Help from another person bathing (including washing, rinsing, drying)?: A Little Help from another person to put on and taking off regular upper body clothing?: A Little Help from another person to put on and taking off regular lower body clothing?: A Little 6 Click Score: 19    End of Session Equipment Utilized During Treatment: Oxygen;Rolling walker  OT Visit Diagnosis: Unsteadiness on feet (R26.81);Other abnormalities of gait and mobility (R26.89);Muscle weakness (generalized) (M62.81);Ataxia, unspecified (R27.0);Other symptoms and signs involving the nervous system (R29.898)   Activity Tolerance Patient tolerated treatment well   Patient Left in chair;with call bell/phone within reach   Nurse Communication Mobility status        Time: 1341-1413 OT Time Calculation (min): 32 min  Charges: OT General Charges $OT Visit: 1 Visit OT Treatments $Self Care/Home Management : 8-22  mins $Therapeutic Activity: 8-22 mins  Mauri Brooklyn OTR/L [phone]   Mauri Brooklyn 06/14/2019, 3:20 PM

## 2019-06-14 NOTE — Progress Notes (Signed)
Chest tube changed to water seal at this time, per Md order. Vitals stable, NAD noted

## 2019-06-14 NOTE — Progress Notes (Signed)
[facility] PCCM:    Subjective: Feels OK Breathing comfortably Neck swelling improved  Objective: Vitals:   06/14/19 0604 06/14/19 0615 06/14/19 0623 06/14/19 0701  BP:    136/86  Pulse:    68  Resp:    20  Temp:      TempSrc:      SpO2: (!) 89% 91% 93% 97%  Weight:      Height:          Intake/Output Summary (Last 24 hours) at 06/14/2019 0716 Last data filed at 06/14/2019 0000 Gross per 24 hour  Intake 750 ml  Output 510 ml  Net 240 ml    General:  Resting comfortably in bed HENT: NCAT OP clear, crepitance still palpable in neck PULM: Coarse crackles bilaterally, normal effort CV: RRR, no mgr GI: BS+, soft, nontender MSK: normal bulk and tone Neuro: awake, alert, no distress, MAEW    CBC    Component Value Date/Time   WBC 12.7 (H) 06/11/2019 0302   RBC 4.21 (L) 06/11/2019 0302   HGB 12.3 (L) 06/11/2019 0302   HCT 38.1 (L) 06/11/2019 0302   PLT 383 06/11/2019 0302   MCV 90.5 06/11/2019 0302   MCH 29.2 06/11/2019 0302   MCHC 32.3 06/11/2019 0302   RDW 13.2 06/11/2019 0302   LYMPHSABS 2.1 06/05/2019 0207   MONOABS 1.5 (H) 06/05/2019 0207   EOSABS 0.0 06/05/2019 0207   BASOSABS 0.0 06/05/2019 0207    BMET    Component Value Date/Time   NA 137 06/14/2019 0530   K 5.5 (H) 06/14/2019 0530   CL 106 06/14/2019 0530   CO2 24 06/14/2019 0530   GLUCOSE 205 (H) 06/14/2019 0530   BUN 61 (H) 06/14/2019 0530   CREATININE 1.53 (H) 06/14/2019 0530   CALCIUM 8.8 (L) 06/14/2019 0530   GFRNONAA 47 (L) 06/14/2019 0530   GFRAA 54 (L) 06/14/2019 0530    CXR images personally reviewed> pigtail in place, tiny residual apical pneumothorax  Impression/Plan: Right pneumothorax in setting of COVID 19 pneumonia: change chest tube to water seal, repeat CXR in AM PT efforts Move to progressive care  Will follow   Roselie Awkward, MD [facility] Pager: [phone] Cell: [phone] After 3pm or if no response, call [phone]

## 2019-06-15 ENCOUNTER — Inpatient Hospital Stay (HOSPITAL_COMMUNITY): Payer: Medicare Other

## 2019-06-15 LAB — BASIC METABOLIC PANEL
Anion gap: 7 (ref 5–15)
BUN: 67 mg/dL — ABNORMAL HIGH (ref 8–23)
CO2: 25 mmol/L (ref 22–32)
Calcium: 8.6 mg/dL — ABNORMAL LOW (ref 8.9–10.3)
Chloride: 106 mmol/L (ref 98–111)
Creatinine, Ser: 1.83 mg/dL — ABNORMAL HIGH (ref 0.61–1.24)
GFR calc Af Amer: 44 mL/min — ABNORMAL LOW (ref >60)
GFR calc non Af Amer: 38 mL/min — ABNORMAL LOW (ref >60)
Glucose, Bld: 181 mg/dL — ABNORMAL HIGH (ref 70–99)
Potassium: 5.2 mmol/L — ABNORMAL HIGH (ref 3.5–5.1)
Sodium: 138 mmol/L (ref 135–145)

## 2019-06-15 LAB — GLUCOSE, CAPILLARY
Glucose-Capillary: 233 mg/dL — ABNORMAL HIGH (ref 70–99)
Glucose-Capillary: 255 mg/dL — ABNORMAL HIGH (ref 70–99)
Glucose-Capillary: 157 mg/dL — ABNORMAL HIGH (ref 70–99)
Glucose-Capillary: 278 mg/dL — ABNORMAL HIGH (ref 70–99)
Glucose-Capillary: 182 mg/dL — ABNORMAL HIGH (ref 70–99)
Glucose-Capillary: 108 mg/dL — ABNORMAL HIGH (ref 70–99)

## 2019-06-15 LAB — D-DIMER, QUANTITATIVE: D-Dimer, Quant: 1.22 ug/mL-FEU — ABNORMAL HIGH (ref 0.00–0.50)

## 2019-06-15 LAB — C-REACTIVE PROTEIN: CRP: 0.6 mg/dL (ref <1.0)

## 2019-06-15 MED ORDER — INSULIN GLARGINE 100 UNIT/ML ~~LOC~~ SOLN
20.0000 [IU] | Freq: Every day | SUBCUTANEOUS | Status: DC
  Administered 2019-06-15 – 2019-06-16 (×2): 20 [IU] via SUBCUTANEOUS
  Filled 2019-06-15 (×2): qty 0.2

## 2019-06-15 NOTE — Progress Notes (Signed)
PROGRESS NOTE  Nathan Tyler  FKC:[id number] DOB: Mar 16, 1956 DOA: 05/31/2019 PCP: Celene Squibb, MD   Brief Narrative: Nathan Tyler is a 64 y.o. male with a history of renal transplant, CKD, chronic hepatitis C, IDT2DM, HTN, hypothyroidism, and chorea who was diagnosed with covid-19 by his PCP on 2/5 and directly admitted to [facility] on 2/7 due to hypoxemia. Remdesivir and IV steroids were given with initial improvement followed by abrupt worsening on 2/10 for which antibiotics were started. Hypoxemia has continued with evidence of pulmonary edema on CXR, echocardiogram showed grade 1 diastolic dysfunction. Home lasix was agumented to lasix 20mg  IV BID for several days with good response, held on 2/19 due to creatinine bump. CXR repeated 2/20 showing right-sided PTX and diffuse subcutaneous air. PCM consulted, patient transferred to ICU for chest tube insertion.  Assessment & Plan: Principal Problem:   Acute respiratory failure with hypoxia (HCC) Active Problems:   HCV infection   COVID-19 virus infection   Essential hypertension   CKD (chronic kidney disease)   Renal transplant, status post   HLD (hyperlipidemia)   H/O insulin dependent diabetes mellitus   Chorea   Overweight (BMI 25.0-29.9)   Hypothyroidism  Acute hypoxemic respiratory failure due to covid-19 pneumonia with superimposed bacterial pneumonia, acute CHF, pneumothorax: SARS-CoV-2 positive on 2/5. Remains hypoxemic. - s/p right pigtail chest tube 2/20. CXR this AM personally reviewed showing small apical pneumothorax, stable bilateral opacities and diffuse subcutaneous air. - Appears to be euvolemic, still down from 220lbs documented at outpatient neurology visit Nov 2020. Weights not consistent. - Completed remdesivir (2/7 - 2/11), cefepime (2/10 - 2/14)   - Tapered IV decadron 6mg  > 4mg  2/17 > Prednisone 20mg  2/19 > 10mg  2/22 taper further today. CRP has normalized. Plan to taper slowly back to baseline dose of 5mg .   - Vitamin C,  zinc - Encourage OOB, IS, FV, and awake proning if able - Tylenol and antitussives prn - Continue airborne, contact precautions for 21 days from positive testing. - D-dimer remains elevated. Continue lovenox 0.5mg /kg q12h for now.   Acute on chronic HFpEF, HTN: Echo showing EF 55-60%, G1DD, no significant valvular abnormalities and no definite wall motion abnormalities on technically difficult study.  - Restarted po lasix, remains euvolemic - Monitor I/O, daily weights, BMP - Holding ACEi as below  Hyperkalemia: Chronic problem per patient. Appears stable. - Continue lokelma, cardiac monitoring, daily K monitoring. - Holding lisinopril. Discussed with PCP by phone 2/19, will plan to hold at discharge until PCP follow up.    Subcutaneous air:  - Noted, will monitor. Appears to be improving.  Stage IIIb CKD s/p renal transplant:  - Continue lasix, CrCl hovering stable/slightly worse today. - Continue anti-rejection medications: mycophenolate 500mg  BID, tacrolimus 1.5mg  BID.  - Avoid nephrotoxins as much as possible including contrast and NSAIDs.  IDT2DM: Poorly-controlled with hyperglycemia and hypoglycemia on 2/11. HbA1c 8.5%.  - Continue lantus, decrease back to home dose of 20u daily due to hypoglycemic episode yesterday afternoon.  - Linagliptin sub for home trulicity.  - Continue SSI, anticipate improvement while weaning prednisone.  HLD:  - Continue statin  Supratherapeutic tacrolimus level: Dosing per pharmacy. - Will need monitoring as outpatient. PCP reports nephrology manages this.  Chorea, unspecified: Noted, chronic, followed by neurology, Dr. Vincenza Hews.  At baseline.Recently changed from tetrabenazine to deutetrabenazine due toinsurance issues/coverage/prior auth per recent neurology note. No consistent evidence of either facial/alternative site swelling nor hyperkalemia associated with this.  - Continue deutetrabenazine.  Overweight: Estimated body mass index is 28.6  kg/m as calculated from the following:   Height as of this encounter: 5\' 11"  (1.803 m).   Weight as of this encounter: 93 kg.  Chronic hepatitis C:  - Monitor LFTs intermittently (normal at last check)  Hypothyroidism: TSH 0.190 recently with free T4 1.36.  - Continue synthroid, will need recheck TFTs. Not clinically hyperthyroid at this time.   DVT prophylaxis: Lovenox 0.5mg /kg q12h Code Status: Full Family Communication: None at bedside Disposition Plan: Continue PCU for chest tube management.   Consultants:   Pulmonary critical care medicine  Procedures:   Right chest tube insertion 06/13/2019.   Antimicrobials:  Remdesivir  Cefepime   Subjective: Was 82% on room air while standing yesterday, though functional capacity has improved significantly. No chest or any other pain. Come cough, still moderately short of breath constantly on exertion, though overall recovers more quickly and has more manageable symptoms overall. Wants to go home ASAP.  Objective: Vitals:   06/14/19 2331 06/15/19 0430 06/15/19 0500 06/15/19 0700  BP: (!) 108/58 128/80  138/76  Pulse: 66 63  62  Resp: 18 18  18   Temp: 98 F (36.7 C) 97.9 F (36.6 C)  97.7 F (36.5 C)  TempSrc: Axillary Oral  Axillary  SpO2: 100% 98%  100%  Weight:   93 kg   Height:        Intake/Output Summary (Last 24 hours) at 06/15/2019 0830 Last data filed at 06/15/2019 0800 Gross per 24 hour  Intake 450 ml  Output 1026 ml  Net -576 ml   Filed Weights   06/12/19 0539 06/14/19 0500 06/15/19 0500  Weight: 88.9 kg 93 kg 93 kg   Gen: 64 y.o. male in no distress Pulm: Nonlabored breathing room air at rest, crackles diffusely on chest wall and with inspiration, no wheezing. No air leak with cough. CV: Regular rate and rhythm. No murmur, rub, or gallop. No JVD, no dependent edema. GI: Abdomen soft, non-tender, non-distended, with normoactive bowel sounds.  Ext: Warm, no deformities Skin: No rashes, lesions or ulcers  on visualized skin. Neuro: Alert and oriented. No focal neurological deficits. Psych: Judgement and insight appear fair. Mood euthymic & affect congruent. Behavior is appropriate.    Data Reviewed: I have personally reviewed following labs and imaging studies  CBC: Recent Labs  Lab 06/11/19 0302  WBC 12.7*  HGB 12.3*  HCT 38.1*  MCV 90.5  PLT 423   Basic Metabolic Panel: Recent Labs  Lab 06/11/19 0302 06/12/19 0302 06/13/19 0055 06/14/19 0530 06/15/19 0215  NA 136 135 132* 137 138  K 5.2* 5.2* 5.1 5.5* 5.2*  CL 106 103 100 106 106  CO2 22 24 24 24 25   GLUCOSE 171* 272* 228* 205* 181*  BUN 76* 77* 75* 61* 67*  CREATININE 1.73* 2.09* 1.81* 1.53* 1.83*  CALCIUM 8.6* 8.6* 8.3* 8.8* 8.6*   Liver Function Tests: Recent Labs  Lab 06/09/19 0415 06/10/19 0334  AST 12* 15  ALT 16 17  ALKPHOS 85 78  BILITOT 0.4 0.7  PROT 5.2* 5.0*  ALBUMIN 2.4* 2.5*   CBG: Recent Labs  Lab 06/13/19 1630 06/13/19 2058 06/14/19 0726 06/14/19 1200 06/14/19 1910  GLUCAP 230* 293* 163* 61* 275*   Recent Results (from the past 240 hour(s))  MRSA PCR Screening     Status: None   Collection Time: 06/13/19  9:31 AM   Specimen: Nasal Mucosa; Nasopharyngeal  Result Value Ref Range Status   MRSA by  PCR NEGATIVE NEGATIVE Final    Comment:        The GeneXpert MRSA Assay (FDA approved for NASAL specimens only), is one component of a comprehensive MRSA colonization surveillance program. It is not intended to diagnose MRSA infection nor to guide or monitor treatment for MRSA infections. Performed at [facility], [place] [address] [place],  [address]       Radiology Studies: DG Chest Port 1 View  Result Date: 06/15/2019 CLINICAL DATA:  Pneumothorax EXAM: PORTABLE CHEST - 1 VIEW COMPARISON:  the previous day's study FINDINGS: Right chest tube projects at the lung base as before. Small apical pneumothorax is slightly more conspicuous. There is extensive  bilateral subcutaneous emphysema as before. Heart size normal. Patchy airspace opacities in the lung bases stable. No definite effusion. Visualized bones unremarkable. IMPRESSION: Slight increase in conspicuity of small right apical pneumothorax with stable chest tube. Stable subcutaneous emphysema and bibasilar airspace opacities. Electronically Signed   By: Lucrezia Europe M.D.   On: 06/15/2019 08:00   DG CHEST PORT 1 VIEW  Result Date: 06/14/2019 CLINICAL DATA:  Status post right chest tube placement EXAM: PORTABLE CHEST 1 VIEW COMPARISON:  Chest radiograph from one day prior. FINDINGS: Right basilar pigtail chest tube is in place. Stable cardiomediastinal silhouette with normal heart size. Tiny residual right apical pneumothorax appears unchanged. No left pneumothorax. No mediastinal shift. No pleural effusion. Patchy opacities throughout the mid to lower lungs bilaterally are unchanged. Extensive subcutaneous emphysema throughout the lower neck and bilateral chest wall, unchanged. IMPRESSION: 1. Tiny residual right apical pneumothorax, unchanged. 2. Stable patchy opacities throughout the mid to lower lungs bilaterally compatible with COVID-19 pneumonia. 3. Stable extensive subcutaneous emphysema throughout the lower neck and bilateral chest wall. Electronically Signed   By: Ilona Sorrel M.D.   On: 06/14/2019 05:25   DG CHEST PORT 1 VIEW  Result Date: 06/13/2019 CLINICAL DATA:  Status post chest tube placement EXAM: PORTABLE CHEST 1 VIEW COMPARISON:  June 13, 2019 FINDINGS: There is been interval placement of a right-sided chest tube. The right-sided pneumothorax is no longer well appreciated. There may be a trace residual right-sided pneumothorax. Extensive subcutaneous emphysema is again noted and is similar to prior study. Again noted are multifocal airspace opacities bilaterally. The heart size is stable. IMPRESSION: 1. Trace residual right-sided pneumothorax status post placement of a right-sided  chest tube. 2. Persistent diffuse subcutaneous emphysema. 3. No convincing left-sided pneumothorax. 4. Persistent diffuse bilateral airspace opacities are again noted. Electronically Signed   By: Constance Holster M.D.   On: 06/13/2019 18:39    Scheduled Meds: . vitamin C  500 mg Oral Daily  . atorvastatin  10 mg Oral q1800  . chlorhexidine  15 mL Mouth Rinse BID  . Chlorhexidine Gluconate Cloth  6 each Topical Daily  . Deutetrabenazine  6 mg Oral BID  . enoxaparin (LOVENOX) injection  0.5 mg/kg Subcutaneous Q12H  . furosemide  20 mg Oral Daily  . insulin aspart  0-15 Units Subcutaneous TID WC  . insulin aspart  0-4 Units Subcutaneous QHS  . insulin glargine  20 Units Subcutaneous Daily  . Ipratropium-Albuterol  1 puff Inhalation Q6H  . levothyroxine  100 mcg Oral QAC breakfast  . linagliptin  5 mg Oral Daily  . mouth rinse  15 mL Mouth Rinse q12n4p  . predniSONE  10 mg Oral Q breakfast  . sodium zirconium cyclosilicate  10 g Oral BID  . tacrolimus  1.5 mg Oral BID  .  zinc sulfate  220 mg Oral Daily   Continuous Infusions:   LOS: 15 days   Time spent: 3 minutes.  Patrecia Pour, MD [facility] [email or web address] 06/15/2019, 8:30 AM

## 2019-06-15 NOTE — Plan of Care (Signed)
  Problem: Education: Goal: Knowledge of risk factors and measures for prevention of condition will improve Outcome: Progressing   

## 2019-06-15 NOTE — Progress Notes (Signed)
[facility] PCCM  S: [name] says he feels OK, still notes that his oxygen level drops when he moves around  O:  Vitals:   06/14/19 2331 06/15/19 0430 06/15/19 0500 06/15/19 0700  BP: (!) 108/58 128/80  138/76  Pulse: 66 63  62  Resp: 18 18  18   Temp: 98 F (36.7 C) 97.9 F (36.6 C)  97.7 F (36.5 C)  TempSrc: Axillary Oral  Axillary  SpO2: 100% 98%  100%  Weight:   93 kg   Height:       2L [place]  Gen: resting in chair, comfortable Neuro: awake, alert, moves all four extremities well, chorea noted Psyche: normal mood and affect Derm: acyanotic Respiratory: normal respirations, non-labored  CXR: images personally reviewed: R pneumothorax is more easily visible today, but still small, pigtail catheter remains in place.  Impression: R pneumothorax due to COVID 19 pneumonia: small R pneumothorax, I'm not convinced it's actually bigger > keep chest tube on water seal > continue flushing chest tube with sterile saline per protocol > repeat CXR in AM  Roselie Awkward, MD [place] [facility] Pager: [phone] Cell: [phone] If no response, call [phone]

## 2019-06-15 NOTE — Plan of Care (Signed)

## 2019-06-15 NOTE — Progress Notes (Signed)
Physical Therapy Treatment Patient Details Name: Nathan Tyler MRN: [id number] DOB: [birth year] Today's Date: 06/15/2019    History of Present Illness Pt is a 64 y.o. male directly admitted from [place] infusion clinic on 05/31/19 with progressive SOB, found to be hypoxic; pt tested (+) COVID-19 on 2/5. PMH includes neuromuscular disorder, chorea, DM, CKD (s/p renal transplant 2010), R foot 5th and L 2nd toe amputations.    PT Comments    Pt demonstrates good progression towards goals. He was able to ambulate 141ft on 2L [place] with 1 extension with SpO2 >85%. He reported mild SOB symptoms, but demonstrated no near LOB.  He required not VCs for sequencing with sit>stand transfer, but required min VC for safety for stand>sit today. Pt is very motivated to work with PT and return home at this time. Pt continues to require O2 supplementation for functional mobility.  Will continue to follow acutely with PT to address functional deficits.  Follow Up Recommendations  Home health PT;Supervision/Assistance - 24 hour     Equipment Recommendations  Rolling walker with 5" wheels    Recommendations for Other Services       Precautions / Restrictions Precautions Precautions: Fall;Other (comment) Precaution Comments: chorea, monitor SpO2 Restrictions Weight Bearing Restrictions: No    Mobility  Bed Mobility Overal bed mobility: Modified Independent Bed Mobility: Supine to Sit     Supine to sit: Modified independent (Device/Increase time) Sit to supine: Supervision   General bed mobility comments: Pt seated in bedside chair upon PT arrival.   Transfers Overall transfer level: Needs assistance Equipment used: Rolling walker (2 wheeled) Transfers: Sit to/from Stand Sit to Stand: Supervision Stand pivot transfers: Min guard       General transfer comment: Pt required 1 attempt to stand with minimal VCs required for sequencing today.  Ambulation/Gait Ambulation/Gait assistance: Min guard Gait  Distance (Feet): 175 Feet Assistive device: None;Rolling walker (2 wheeled) Gait Pattern/deviations: Step-through pattern;Shuffle(intermittent step to due to difficulty with tremors)     General Gait Details: Pt ambulated on 2L with SpO2 > 85% with 1 extension on [place].       Balance Overall balance assessment: Needs assistance Sitting-balance support: Feet supported Sitting balance-Leahy Scale: Good Sitting balance - Comments: able to sit edge of bed with UE support and fair trunk control    Standing balance support: No upper extremity supported Standing balance-Leahy Scale: Fair Standing balance comment: Improved dynamic standing balance with use of RW. Patient agrees with use of RW initially for mobility to increase safety and improve activity tolerance.          Cognition Arousal/Alertness: Awake/alert Behavior During Therapy: WFL for tasks assessed/performed Overall Cognitive Status: No family/caregiver present to determine baseline cognitive functioning           General Comments: Very motivated to participate in therapy      Exercises Other Exercises Other Exercises: Encouraged pursed lip breathing throughout x 20 Other Exercises: Diaphragmatic breathing x 5      PT Goals (current goals can now be found in the care plan section) Acute Rehab PT Goals PT Goal Formulation: With patient Time For Goal Achievement: 06/16/19 Potential to Achieve Goals: Good Progress towards PT goals: Progressing toward goals    Frequency    Min 3X/week      PT Plan Current plan remains appropriate       AM-PAC PT "6 Clicks" Mobility   Outcome Measure  Help needed turning from your back to your side while in a  flat bed without using bedrails?: None Help needed moving from lying on your back to sitting on the side of a flat bed without using bedrails?: A Little Help needed moving to and from a bed to a chair (including a wheelchair)?: A Little Help needed standing up from  a chair using your arms (e.g., wheelchair or bedside chair)?: None Help needed to walk in hospital room?: A Little Help needed climbing 3-5 steps with a railing? : A Lot 6 Click Score: 19    End of Session Equipment Utilized During Treatment: Oxygen Activity Tolerance: Patient limited by fatigue Patient left: in chair;with call bell/phone within reach;with chair alarm set Nurse Communication: Mobility status PT Visit Diagnosis: Unsteadiness on feet (R26.81);Other abnormalities of gait and mobility (R26.89);Muscle weakness (generalized) (M62.81)     Time: 9629-5284 PT Time Calculation (min) (ACUTE ONLY): 24 min  Charges:  $Gait Training: 8-22 mins $Therapeutic Exercise: 8-22 mins                     Ann Held PT, DPT Acute Rehab [place] P: [place] 06/15/2019, 12:10 PM

## 2019-06-15 NOTE — Progress Notes (Addendum)
Physical Therapy Treatment Patient Details Name: Nathan Tyler MRN: [id number] DOB: 06-13-1955 Today's Date: 06/15/2019    History of Present Illness Pt is a 64 y.o. male directly admitted from [place] infusion clinic on 05/31/19 with progressive SOB, found to be hypoxic; pt tested (+) COVID-19 on 2/5. PMH includes neuromuscular disorder, chorea, DM, CKD (s/p renal transplant 2010), R foot 5th and L 2nd toe amputations.    PT Comments    Pt progressed well towards PT goals this session. He was able to ambulated at much increased distance with RW on 3L [place], with supervision A and management for lines. 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 Pt balance appears to be improving and will attempt mobility tomorrow without RW support along with balance testing. Pt continues to require VCs for sequencing with intermittent sit<>stand transfers for safety and for line management. Pt given handout for diaphragmatic breathing and verbalized understanding. Pt continues to desat with ambulation to mid 70's, will test accuracy with nelcor forehead monitor tomorrow. Pt resting in bed with 2L [place] upon cessation of treatment stating he was comfortable with needs in reach. Currently measured with good waveform adult finger monitor. PT will continue following acutely. Spoke with RN about medical management plan and PT mobility.    Follow Up Recommendations  Home health PT;Supervision/Assistance - 24 hour     Equipment Recommendations  Rolling walker with 5" wheels    Recommendations for Other Services       Precautions / Restrictions Precautions Precautions: Fall;Other (comment) Precaution Comments: chorea, monitor SpO2 Restrictions Weight Bearing Restrictions: No    Mobility  Bed Mobility Overal bed mobility: Modified Independent Bed Mobility: Supine to Sit     Supine to sit: Modified independent (Device/Increase  time) Sit to supine: Supervision   General bed mobility comments: Pt seated in bedside chair upon PT arrival.   Transfers Overall transfer level: Needs assistance Equipment used: Rolling walker (2 wheeled) Transfers: Sit to/from Stand Sit to Stand: Supervision Stand pivot transfers: Min guard       General transfer comment: Pt required UE usage after 3rd sit to stand today.  Ambulation/Gait Ambulation/Gait assistance: Min guard Gait Distance (Feet): 320 Feet Assistive device: None;Rolling walker (2 wheeled) Gait Pattern/deviations: Step-through pattern;Ataxic     General Gait Details: Pt ambulated on 3L O2 with SpO2 > 75% after performing 20 sit to stands on lowered bed. Pt SpO2 dropped to 75% during sit to stands, but when patient reported being ready to walk was only recovered to 85%. Pt HR maintained around 125-130 throughout ambulation with resting HR around 115 upon entry.    Stairs             Wheelchair Mobility    Modified Rankin (Stroke Patients Only)       Balance Overall balance assessment: Needs assistance Sitting-balance support: Feet supported Sitting balance-Leahy Scale: Good Sitting balance - Comments: able to sit edge of bed with UE support and fair trunk control    Standing balance support: Bilateral upper extremity supported Standing balance-Leahy Scale: Fair Standing balance comment: Improved dynamic standing balance with use of RW. Patient agrees with use of RW initially for mobility to increase safety and improve activity tolerance.                            Cognition Arousal/Alertness: Awake/alert Behavior During Therapy:  WFL for tasks assessed/performed Overall Cognitive Status: No family/caregiver present to determine baseline cognitive functioning                                 General Comments: Very motivated to participate in therapy      Exercises Other Exercises Other Exercises: Encouraged pursed  lip breathing throughout x 20 Other Exercises: Diaphragmatic breathing x 5 Other Exercises: sit to stands (x20)    General Comments General comments (skin integrity, edema, etc.): Pt demonstrates SpO2 >92% upon entry at rest with 2L O2 [place] & 3 extensions. Pt continues to drop O2 sat to mid 70s during ambulation with good waveform. Will check with nelcore forehead monitor tomorrow during ambulation.      Pertinent Vitals/Pain      Home Living                      Prior Function            PT Goals (current goals can now be found in the care plan section) Acute Rehab PT Goals PT Goal Formulation: With patient Time For Goal Achievement: 06/16/19 Potential to Achieve Goals: Good Progress towards PT goals: Progressing toward goals    Frequency    Min 3X/week      PT Plan Current plan remains appropriate    Co-evaluation              AM-PAC PT "6 Clicks" Mobility   Outcome Measure  Help needed turning from your back to your side while in a flat bed without using bedrails?: None Help needed moving from lying on your back to sitting on the side of a flat bed without using bedrails?: None Help needed moving to and from a bed to a chair (including a wheelchair)?: A Little Help needed standing up from a chair using your arms (e.g., wheelchair or bedside chair)?: None Help needed to walk in hospital room?: A Little Help needed climbing 3-5 steps with a railing? : A Little 6 Click Score: 21    End of Session Equipment Utilized During Treatment: Oxygen Activity Tolerance: Patient limited by fatigue Patient left: in chair;with call bell/phone within reach;with chair alarm set Nurse Communication: Mobility status PT Visit Diagnosis: Unsteadiness on feet (R26.81);Other abnormalities of gait and mobility (R26.89);Muscle weakness (generalized) (M62.81)     Time: 0940-7680 PT Time Calculation (min) (ACUTE ONLY): 30 min  Charges:  $Gait Training: 8-22  mins $Therapeutic Exercise: 8-22 mins                     Ann Held PT, DPT Acute Rehab [place] P: [place] 06/15/2019, 4:03 PM

## 2019-06-16 ENCOUNTER — Inpatient Hospital Stay (HOSPITAL_COMMUNITY): Payer: Medicare Other

## 2019-06-16 LAB — BASIC METABOLIC PANEL
Anion gap: 7 (ref 5–15)
BUN: 61 mg/dL — ABNORMAL HIGH (ref 8–23)
CO2: 24 mmol/L (ref 22–32)
Calcium: 8.7 mg/dL — ABNORMAL LOW (ref 8.9–10.3)
Chloride: 107 mmol/L (ref 98–111)
Creatinine, Ser: 1.57 mg/dL — ABNORMAL HIGH (ref 0.61–1.24)
GFR calc Af Amer: 53 mL/min — ABNORMAL LOW (ref >60)
GFR calc non Af Amer: 46 mL/min — ABNORMAL LOW (ref >60)
Glucose, Bld: 109 mg/dL — ABNORMAL HIGH (ref 70–99)
Potassium: 4.9 mmol/L (ref 3.5–5.1)
Sodium: 138 mmol/L (ref 135–145)

## 2019-06-16 LAB — GLUCOSE, CAPILLARY
Glucose-Capillary: 81 mg/dL (ref 70–99)
Glucose-Capillary: 193 mg/dL — ABNORMAL HIGH (ref 70–99)
Glucose-Capillary: 149 mg/dL — ABNORMAL HIGH (ref 70–99)

## 2019-06-16 MED ORDER — PREDNISONE 5 MG PO TABS: 5.0000 mg | ORAL_TABLET | Freq: Every day | ORAL | Status: DC

## 2019-06-16 MED ORDER — ATORVASTATIN CALCIUM 10 MG PO TABS
10.0000 mg | ORAL_TABLET | Freq: Every day | ORAL | Status: DC
  Administered 2019-06-16: 17:00:00 10 mg via ORAL
  Filled 2019-06-16: qty 1

## 2019-06-16 MED ORDER — PREDNISONE 5 MG PO TABS: ORAL_TABLET | ORAL | 0 refills | Status: AC

## 2019-06-16 NOTE — Progress Notes (Signed)
Occupational Therapy Treatment Patient Details Name: Nathan Tyler MRN: [id number] DOB: January 19, 1956 Today's Date: 06/16/2019    History of present illness Pt is a 64 y.o. male directly admitted from [place] infusion clinic on 05/31/19 with progressive SOB, found to be hypoxic; pt tested (+) COVID-19 on 2/5. PMH includes neuromuscular disorder, chorea, DM, CKD (s/p renal transplant 2010), R foot 5th and L 2nd toe amputations.   OT comments  Pt continues to make progress in therapy, demonstrating improved independence and activity tolerance. Pt on 4L [place] (2 extensions) with SpO2 93% at rest. Pt able to ambulate to/from bathroom without an assistive device, noting 0 instances of loss of balance. Pt completed toileting task and tolerated standing 1 x 9 min at the sink to complete grooming, hygiene, and sponge bathing tasks. SpO2 dropped to 72% with pt requiring 3-4 min seated rest break to recover to 90s. Educated pt on safety strategies with O2 tubing as pt reports that he will be discharging home on oxygen. Worked on item retrieval task while managing his own O2 tubing. Pt tolerated standing and ambulating around room 1 x 3 min to pick up items. Pt required only min cues for safety with lines. SpO2 dropped to 83% following task with pt requiring 3-4 min seated recovery to return to 90s. Continued education with pt on pursed lip breathing strategies with fair understanding and follow through. RN updated. OT will continue to follow acutely.    Follow Up Recommendations  Home health OT;Supervision/Assistance - 24 hour    Equipment Recommendations  3 in 1 bedside commode(for use in shower)    Recommendations for Other Services      Precautions / Restrictions Precautions Precautions: Fall;Other (comment) Precaution Comments: chorea, monitor SpO2 Restrictions Weight Bearing Restrictions: No       Mobility Bed Mobility Overal bed mobility: Modified Independent Bed Mobility: Supine to Sit     Supine  to sit: Modified independent (Device/Increase time)        Transfers Overall transfer level: Needs assistance Equipment used: None Transfers: Sit to/from [facility] Sit to Stand: Supervision Stand pivot transfers: Supervision       General transfer comment: Supervision to ensure safety with O2 tubing.     Balance Overall balance assessment: Needs assistance Sitting-balance support: Feet supported Sitting balance-Leahy Scale: Good       Standing balance-Leahy Scale: Fair                             ADL either performed or assessed with clinical judgement   ADL Overall ADL's : Needs assistance/impaired     Grooming: Wash/dry hands;Wash/dry face;Oral care;Applying deodorant;Set up;Supervision/safety   Upper Body Bathing: Supervision/ safety;Set up;Standing   Lower Body Bathing: Supervison/ safety;Set up;Sit to/from stand           Toilet Transfer: Sales executive;Ambulation;Grab bars   Toileting- Clothing Manipulation and Hygiene: Supervision/safety;Sit to/from stand       Functional mobility during ADLs: Supervision/safety General ADL Comments: Pt able to ambulate to/from bathroom without an assistive device. Noted 0 instances of LOB, however pt unsteady on feet.      Vision       Perception     Praxis      Cognition Arousal/Alertness: Awake/alert Behavior During Therapy: WFL for tasks assessed/performed Overall Cognitive Status: No family/caregiver present to determine baseline cognitive functioning  General Comments: Very motivated to participate in therapy        Exercises Exercises: Other exercises Other Exercises Other Exercises: Encouraged pursed lip breathing throughout   Shoulder Instructions       General Comments Pt on 4L [place] (2 extensions) with SpO2 93% at rest. SpO2 dropped to 72% during initial standing task and 83% during second standing  task. Pt required 3-4 min seated recovery to return to 90s. Pt reported min to mod SOB throughout.     Pertinent Vitals/ Pain       Pain Assessment: No/denies pain  Home Living                                          Prior Functioning/Environment              Frequency           Progress Toward Goals  OT Goals(current goals can now be found in the care plan section)  Progress towards OT goals: Progressing toward goals  ADL Goals Pt Will Perform Grooming: with modified independence;standing Pt Will Perform Upper Body Bathing: Independently;sitting Pt Will Transfer to Toilet: with modified independence;ambulating Additional ADL Goal #1: Patient will maintain SpO2>90 during standing ADLs. Additional ADL Goal #2: Patient will independently initiate and perform pursed lip breathing during ADLs.  Plan Discharge plan remains appropriate    Co-evaluation                 AM-PAC OT "6 Clicks" Daily Activity     Outcome Measure   Help from another person eating meals?: None Help from another person taking care of personal grooming?: A Little Help from another person toileting, which includes using toliet, bedpan, or urinal?: A Little Help from another person bathing (including washing, rinsing, drying)?: A Little Help from another person to put on and taking off regular upper body clothing?: A Little Help from another person to put on and taking off regular lower body clothing?: A Little 6 Click Score: 19    End of Session Equipment Utilized During Treatment: Oxygen  OT Visit Diagnosis: Unsteadiness on feet (R26.81);Other abnormalities of gait and mobility (R26.89);Muscle weakness (generalized) (M62.81);Ataxia, unspecified (R27.0);Other symptoms and signs involving the nervous system (R29.898)   Activity Tolerance Patient tolerated treatment well   Patient Left in chair;with call bell/phone within reach   Nurse Communication Mobility status         Time: 3664-4034 OT Time Calculation (min): 38 min  Charges: OT General Charges $OT Visit: 1 Visit OT Treatments $Self Care/Home Management : 8-22 mins $Therapeutic Activity: 23-37 mins  Mauri Brooklyn OTR/L [phone]   Mauri Brooklyn 06/16/2019, 10:05 AM

## 2019-06-16 NOTE — Care Management Important Message (Signed)
Important Message  Patient Details  Name: Nathan Tyler MRN: [id number] Date of Birth: August 02, 1955   Medicare Important Message Given:  Yes - Important Message mailed due to current National Emergency  Verbal consent obtained due to current National Emergency  Relationship to patient: Spouse/Significant Other Contact Name: Terri Moss-Post Call Date: 06/16/19  Time: 1052 Phone: [phone] Outcome: No Answer/Busy Important Message mailed to: Patient address on file    Delorse Lek 06/16/2019, 10:53 AM

## 2019-06-16 NOTE — Progress Notes (Signed)
SATURATION QUALIFICATIONS: (This note is used to comply with regulatory documentation for home oxygen)  Patient Saturations on Room Air at Rest = 92%  Patient Saturations on Room Air while Ambulating = 70%  Patient Saturations on 4 Liters of oxygen while Ambulating = 72%  Please briefly explain why patient needs home oxygen: Patient desats quickly while ambulating on RA. Desats down to 72% on 4L while ambulating!!

## 2019-06-16 NOTE — Progress Notes (Signed)
[facility] PCCM  S: feels great  O:  Vitals:   06/15/19 1912 06/15/19 2342 06/16/19 0341 06/16/19 0726  BP: 124/84 126/80 114/84 (!) 141/92  Pulse: 74 71 64 75  Resp: 20 18 (!) 21 20  Temp: (!) 96.8 F (36 C) (!) 97.4 F (36.3 C) (!) 97.3 F (36.3 C) 97.7 F (36.5 C)  TempSrc: Axillary Axillary Axillary Axillary  SpO2: 95% 97% 95% 97%  Weight:      Height:       2L [address]  gen sitting up in chair, comfortable resp even, non-labored Speech clear, choherent Derm: acyanotic  cxr post chest tube removal reviewed: small R apical pneumothorax still present  Impression: R pneumothorax due to ARDS from COVID 19 pneumonia: stable post chest tube removal Plan D/c home Needs CXR in 24 hours  Back to ED emergently if dyspnea or chest pain Agree with O2 as needed to maintain O2 saturation > 88%  Would refer to pulmonary if not off oxygen in 4-6 weeks  Roselie Awkward, MD [place] [facility] Pager: [phone] Cell: [phone] If no response, call [phone]

## 2019-06-16 NOTE — Progress Notes (Signed)
PHYSICAL THERAPY PROGRESS REPORT:  CLINICAL IMPRESSION: Pt making progress with mobility was able to ambulate approx 14ft w/ RW and SBA-min guard assist, needs cues for safety and also redirection, with distraction tends to become quite unsafe. With ambulation on 4L/min via [place] noted min desat to be in low 80s with seated rest and cues for purse dlip breathing able to recover sats to high 80s within 53mins. Pt had satted his goal was to be able to stand x 7-11 hours for work again, this pm was able to stand x 10 mins supported at window min desat noted to be 84% but again pt able to reocver with pursed lip breathing.     06/16/19 1400  PT Visit Information  Last PT Received On 06/16/19  Assistance Needed +1  History of Present Illness Pt is a 64 y.o. male directly admitted from [place] infusion clinic on 05/31/19 with progressive SOB, found to be hypoxic; pt tested (+) COVID-19 on 2/5. PMH includes neuromuscular disorder, chorea, DM, CKD (s/p renal transplant 2010), R foot 5th and L 2nd toe amputations.  Precautions  Precautions Fall;Other (comment)  Precaution Comments chorea, monitor SpO2  Restrictions  Weight Bearing Restrictions No  Pain Assessment  Pain Assessment No/denies pain  Cognition  Arousal/Alertness Awake/alert  Behavior During Therapy WFL for tasks assessed/performed  Overall Cognitive Status No family/caregiver present to determine baseline cognitive functioning  Bed Mobility  General bed mobility comments pt sitting in recliner at PT arrival  Transfers  Overall transfer level Needs assistance  Equipment used None  Transfers Sit to/from Stand;Stand Pivot Transfers  Sit to Stand Supervision;Modified independent (Device/Increase time)  Stand pivot transfers Supervision;Modified independent (Device/Increase time)  Ambulation/Gait  Ambulation/Gait assistance Min guard  Gait Distance (Feet) 180 Feet  Assistive device Rolling walker (2 wheeled)  Gait Pattern/deviations  Step-through pattern;Shuffle;Steppage;Ataxic;Leaning posteriorly;Staggering left;Staggering right (flat footed)  General Gait Details needs reminders to focus on gait as patterns become erratic wehn distracted  Gait velocity decreased  Balance  Overall balance assessment Needs assistance  Sitting-balance support Feet supported  Sitting balance-Leahy Scale Good  Standing balance support During functional activity;Bilateral upper extremity supported  Standing balance-Leahy Scale Fair  General Comments  General comments (skin integrity, edema, etc.) pt had previously expressed desire to be able to stand x 7 hrs to return to work, pt able to stand x 10 mins w/o LOB and maintain sats >85%  PT - End of Session  Equipment Utilized During Treatment Oxygen  Activity Tolerance Patient limited by fatigue;Patient limited by lethargy;Treatment limited secondary to medical complications (Comment)  Patient left in chair;with call bell/phone within reach  Nurse Communication Mobility status   PT - Assessment/Plan  PT Plan Current plan remains appropriate  PT Visit Diagnosis Unsteadiness on feet (R26.81);Other abnormalities of gait and mobility (R26.89);Muscle weakness (generalized) (M62.81)  PT Frequency (ACUTE ONLY) Min 3X/week  Follow Up Recommendations Home health PT;Supervision/Assistance - 24 hour  PT equipment Rolling walker with 5" wheels  AM-PAC PT "6 Clicks" Mobility Outcome Measure (Version 2)  Help needed turning from your back to your side while in a flat bed without using bedrails? 4  Help needed moving from lying on your back to sitting on the side of a flat bed without using bedrails? 4  Help needed moving to and from a bed to a chair (including a wheelchair)? 3  Help needed standing up from a chair using your arms (e.g., wheelchair or bedside chair)? 4  Help needed to walk in hospital room?  3  Help needed climbing 3-5 steps with a railing?  3  6 Click Score 21  Consider Recommendation  of Discharge To: Home with no services  PT Goal Progression  Progress towards PT goals Progressing toward goals  Acute Rehab PT Goals  PT Goal Formulation With patient  Time For Goal Achievement 06/16/19  Potential to Achieve Goals Good  PT Time Calculation  PT Start Time (ACUTE ONLY) 1347  PT Stop Time (ACUTE ONLY) 1415  PT Time Calculation (min) (ACUTE ONLY) 28 min  PT Treatments  $Gait Training 8-22 mins  $Therapeutic Activity 8-22 mins   Horald Chestnut, PT

## 2019-06-16 NOTE — TOC Progression Note (Signed)
Transition of Care [facility]) - Progression Note    Patient Details  Name: Nathan Tyler MRN: [id number] Date of Birth: 1956-03-25  Transition of Care [facility]) CM/SW Contact  Joaquin Courts, RN Phone Number: 06/16/2019, 10:33 AM  Clinical Narrative:    Alvis Lemmings to provide HHPT/OT. [employer] to deliver concentrator to home, [facility] notified to deliver rolling walker and portable tank to bedside.     Expected Discharge Plan: [place] Barriers to Discharge: No Barriers Identified  Expected Discharge Plan and Services Expected Discharge Plan: [place] In-house Referral: Clinical Social Work Discharge Planning Services: CM Consult Post Acute Care Choice: [place] arrangements for the past 2 months: Single Family Home Expected Discharge Date: 06/16/19               DME Arranged: Gilford Rile rolling, Oxygen DME Agency: [place] Date DME Agency Contacted: 06/16/19 Time DME Agency Contacted: 1032 Representative spoke with at DME Agency: Magda Paganini HH Arranged: PT, OT [facility] Agency: [place] Date [place]: 06/16/19 Time [place]: 1033 Representative spoke with at [place]: [place] Determinants of Health ([place]) Interventions    Readmission Risk Interventions No flowsheet data found.

## 2019-06-16 NOTE — Consult Note (Addendum)
   [facility] CM Inpatient Consult   06/16/2019  Nathan Tyler 09-18-1955 [id number]   Patient screened long length of stay for hospitalization in the [place] [place] [facility] [ACO] and to check if potential [facility] Care Management services are needed for post hospital follow up.  Review of patient's medical record which includes but not limited reveals patient is from [place] Discharge Summary from brief/interim summary notes today 06/16/19:  [name] a 64 y.o.malewith a history of renal transplant, CKD, chronic hepatitis C, IDT2DM, HTN, hypothyroidism, and chorea who was diagnosed with covid-19 by his PCP on 2/5 and directly admitted to [facility] on 2/7 due to hypoxemia. Remdesivir and IV steroids were given with initial improvement followed by abrupt worsening on 2/10 for which antibiotics were started. Hypoxemia has continued with evidence of pulmonary edema on CXR, echocardiogram showed grade 1 diastolic dysfunction. Home lasix was agumented to lasix 20mg  IV BID for several days with good response, held on 2/19 due to creatinine bump. CXR repeated 2/20 showing right-sided PTX and diffuse subcutaneous air. PCCM consulted, patient transferred to ICU for chest tube insertion. Serial xrays showed improvement and chest tube was removed 2/23.  Noted the patient had COVID immunization 05/13/19 noted in EMR.  Primary Care Provider is listed as Dr. Delaine Lame   Plan:  Continue to follow progress and disposition to assess for post hospital care management needs.  Current disposition is recommended for home with home health. Spoke with the patient via hospital phone, HIPAA verified and informed him of [place] Management follow up.  Patient was gracious and will assign follow up with Pneumonia EMMI.  Please place a [facility] Care Management consult as appropriate and for questions contact:   Natividad Brood, RN BSN [place] [facility] Liaison   [phone] business mobile phone Toll free office [phone]  Fax number: [phone] [email or web address]

## 2019-06-16 NOTE — Discharge Summary (Addendum)
Physician Discharge Summary  Nathan Tyler:[id number] DOB: 1955/10/17 DOA: 05/31/2019  PCP: Celene Squibb, MD  Admit date: 05/31/2019 Discharge date: 06/16/2019  Admitted From: Home Disposition: Home   Recommendations for Outpatient Follow-up:  1. Follow up with PCP in the next week.  2. Pulmonology recommends repeat CXR on 2/24. Confirmed by phone with PCP that he is able to arrange outpatient CXR. 3. Continue tapering prednisone. Down to 10mg  daily on discharge, defer to PCP regarding taper schedule to return to 5mg  daily dosing.  4. Holding lisinopril with persistent hyperkalemia, defer to PCP and/or nephrology regarding reinitiation.  5. Monitor CMP with attention to creatinine, potassium, and LFTs.  6. Recheck TFTs.  7. Monitor tacrolimus levels to assist with dosing.   Home Health: PT, OT Equipment/Devices: Rolling walker, supplemental oxygen Discharge Condition: Stable CODE STATUS: Full Diet recommendation: Heart healthy/carb-modified  Brief/Interim Summary: Nathan Tyler is a 64 y.o. male with a history of renal transplant, CKD, chronic hepatitis C, IDT2DM, HTN, hypothyroidism, and chorea who was diagnosed with covid-19 by his PCP on 2/5 and directly admitted to [facility] on 2/7 due to hypoxemia. Remdesivir and IV steroids were given with initial improvement followed by abrupt worsening on 2/10 for which antibiotics were started. Hypoxemia has continued with evidence of pulmonary edema on CXR, echocardiogram showed grade 1 diastolic dysfunction. Home lasix was agumented to lasix 20mg  IV BID for several days with good response, held on 2/19 due to creatinine bump. CXR repeated 2/20 showing right-sided PTX and diffuse subcutaneous air. PCCM consulted, patient transferred to ICU for chest tube insertion. Serial xrays showed improvement and chest tube was removed 2/23. Hypoxemia has shown improvement and the patient is functionally stable for return to home. Please see below for full  details.  Discharge Diagnoses:  Principal Problem:   Acute respiratory failure with hypoxia (HCC) Active Problems:   HCV infection   COVID-19 virus infection   Essential hypertension   CKD (chronic kidney disease)   Renal transplant, status post   HLD (hyperlipidemia)   H/O insulin dependent diabetes mellitus   Chorea   Overweight (BMI 25.0-29.9)   Hypothyroidism  Acute hypoxemic respiratory failure due to covid-19 pneumonia with superimposed bacterial pneumonia, acute CHF, pneumothorax: SARS-CoV-2 positive on 2/5. Remains hypoxemic. - s/p right pigtail chest tube 2/20 > removed 2/23 with stable post-removal CXR. Continues to have diffuse subcutaneous air.  - Appears to be euvolemic, still down from 220lbs documented at outpatient neurology visit Nov 2020 to 205 at discharge.  - Completed remdesivir (2/7 - 2/11), cefepime (2/10 - 2/14)   - Tapered IV decadron 6mg  > 4mg  2/17 > Prednisone 20mg  2/19 > 10mg  2/22. CRP has normalized. Plan to taper slowly back to baseline dose of 5mg .   - Continue airborne, contact precautions for 21 days from positive testing. - Per available evidence, post discharge DVT prophylaxis not recommended.  Acute on chronic HFpEF, HTN: Echo showing EF 55-60%, G1DD, no significant valvular abnormalities and no definite wall motion abnormalities on technically difficult study.  - Restarted po lasix, remains euvolemic, weight 205lbs.  - Holding ACEi as below  Hyperkalemia: Chronic problem per patient. Appears stable. - Continue lokelma, cardiac monitoring, daily K monitoring. - Holding lisinopril. Discussed with PCP by phone 2/19, will plan to hold at discharge until PCP follow up.    Subcutaneous air:  - Noted, will monitor. Appears to be improving.  Stage IIIb CKD s/p renal transplant:  - Continue lasix, CrCl hovering stable - Continue anti-rejection  medications: mycophenolate 500mg  BID, tacrolimus 1.5mg  BID.  - Avoid nephrotoxins as much as possible  including contrast and NSAIDs.  IDT2DM: Poorly-controlled with hyperglycemia and hypoglycemia on 2/11. HbA1c 8.5%.  - Return to home dosing of medications at discharge.   HLD:  - Continue statin  Supratherapeutic tacrolimus level: Dosing per pharmacy. - Will need monitoring as outpatient. PCP reports nephrology manages this.  Chorea, unspecified: Noted, chronic, followed by neurology, Dr. Vincenza Hews.  At baseline. Recently changed from tetrabenazine to deutetrabenazine due toinsurance issues/coverage/prior auth per recent neurology note. - Continue deutetrabenazine.   Overweight: Estimated body mass index is 28.6 kg/m as calculated from the following:   Height as of this encounter: 5\' 11"  (1.803 m).   Weight as of this encounter: 93 kg.  Chronic hepatitis C:  - Monitor LFTs intermittently (normal at last check)  Hypothyroidism: TSH 0.190 recently with free T4 1.36.  - Continue synthroid, will need recheck TFTs. Not clinically hyperthyroid at this time.   Discharge Instructions Discharge Instructions    Diet - low sodium heart healthy   Complete by: As directed    Discharge instructions   Complete by: As directed    You are being discharged from the hospital after treatment for covid-19 infection. You are felt to be stable enough to no longer require inpatient monitoring, testing, and treatment, though you will need to follow the recommendations below: - You will need to have a repeat chest xray tomorrow, 2/24. I've spoken with Dr. Nevada Crane, his office will call you to arrange this. If for some reason you don't hear from them, please call. - Continue taking prednisone with plans to taper the dose from your current 10mg  down to 7.5mg  after 1 week, then back to home 5mg  after 1 week. The taper portion of this was sent to [facility].  - Follow up with Dr. Nevada Crane very closely as you will need repeat lab testing to monitor high potassium, as well as kidney, liver, and thyroid function  studies. - Stop taking lisinopril until you follow up with your doctor(s) as well.  - Per CDC guidelines, you will need to remain in isolation for 21 days from your first positive covid test (2/5). - Do not take NSAID medications (including, but not limited to, ibuprofen, advil, motrin, naproxen, aleve, goody's powder, etc.) - Follow up with your doctor in the next week via telehealth or seek medical attention right away if your symptoms get WORSE.  - Consider donating plasma after you have recovered (either 14 days after a negative test or 28 days after symptoms have completely resolved) because your antibodies to this virus may be helpful to give to others with life-threatening infections. Please go to the website www.[email or web address] if you would like to consider volunteering for plasma donation.    Directions for you at home:  Wear a facemask You should wear a facemask that covers your nose and mouth when you are in the same room with other people and when you visit a healthcare provider. People who live with or visit you should also wear a facemask while they are in the same room with you.  Separate yourself from other people in your home As much as possible, you should stay in a different room from other people in your home. Also, you should use a separate bathroom, if available.  Avoid sharing household items You should not share dishes, drinking glasses, cups, eating utensils, towels, bedding, or other items with other people in your home. After  using these items, you should wash them thoroughly with soap and water.  Cover your coughs and sneezes Cover your mouth and nose with a tissue when you cough or sneeze, or you can cough or sneeze into your sleeve. Throw used tissues in a lined trash can, and immediately wash your hands with soap and water for at least 20 seconds or use an alcohol-based hand rub.  Wash your [facility] your hands often and thoroughly with soap and water for at  least 20 seconds. You can use an alcohol-based hand sanitizer if soap and water are not available and if your hands are not visibly dirty. Avoid touching your eyes, nose, and mouth with unwashed hands.  Directions for those who live with, or provide care at home for you:  Limit the number of people who have contact with the patient If possible, have only one caregiver for the patient. Other household members should stay in another home or place of residence. If this is not possible, they should stay in another room, or be separated from the patient as much as possible. Use a separate bathroom, if available. Restrict visitors who do not have an essential need to be in the home.  Ensure good ventilation Make sure that shared spaces in the home have good air flow, such as from an air conditioner or an opened window, weather permitting.  Wash your hands often Wash your hands often and thoroughly with soap and water for at least 20 seconds. You can use an alcohol based hand sanitizer if soap and water are not available and if your hands are not visibly dirty. Avoid touching your eyes, nose, and mouth with unwashed hands. Use disposable paper towels to dry your hands. If not available, use dedicated cloth towels and replace them when they become wet.  Wear a facemask and gloves Wear a disposable facemask at all times in the room and gloves when you touch or have contact with the patient's blood, body fluids, and/or secretions or excretions, such as sweat, saliva, sputum, nasal mucus, vomit, urine, or feces.  Ensure the mask fits over your nose and mouth tightly, and do not touch it during use. Throw out disposable facemasks and gloves after using them. Do not reuse. Wash your hands immediately after removing your facemask and gloves. If your personal clothing becomes contaminated, carefully remove clothing and launder. Wash your hands after handling contaminated clothing. Place all used  disposable facemasks, gloves, and other waste in a lined container before disposing them with other household waste. Remove gloves and wash your hands immediately after handling these items.  Do not share dishes, glasses, or other household items with the patient Avoid sharing household items. You should not share dishes, drinking glasses, cups, eating utensils, towels, bedding, or other items with a patient who is confirmed to have, or being evaluated for, COVID-19 infection. After the person uses these items, you should wash them thoroughly with soap and water.  Wash laundry thoroughly Immediately remove and wash clothes or bedding that have blood, body fluids, and/or secretions or excretions, such as sweat, saliva, sputum, nasal mucus, vomit, urine, or feces, on them. Wear gloves when handling laundry from the patient. Read and follow directions on labels of laundry or clothing items and detergent. In general, wash and dry with the warmest temperatures recommended on the label.  Clean all areas the individual has used often Clean all touchable surfaces, such as counters, tabletops, doorknobs, bathroom fixtures, toilets, phones, keyboards, tablets, and bedside  tables, every day. Also, clean any surfaces that may have blood, body fluids, and/or secretions or excretions on them. Wear gloves when cleaning surfaces the patient has come in contact with. Use a diluted bleach solution (e.g., dilute bleach with 1 part bleach and 10 parts water) or a household disinfectant with a label that says EPA-registered for coronaviruses. To make a bleach solution at home, add 1 tablespoon of bleach to 1 quart (4 cups) of water. For a larger supply, add  cup of bleach to 1 gallon (16 cups) of water. Read labels of cleaning products and follow recommendations provided on product labels. Labels contain instructions for safe and effective use of the cleaning product including precautions you should take when applying  the product, such as wearing gloves or eye protection and making sure you have good ventilation during use of the product. Remove gloves and wash hands immediately after cleaning.  Monitor yourself for signs and symptoms of illness Caregivers and household members are considered close contacts, should monitor their health, and will be asked to limit movement outside of the home to the extent possible. Follow the monitoring steps for close contacts listed on the symptom monitoring form.  If you have additional questions, contact your local health department or call the epidemiologist on call at [phone] (available 24/7). This guidance is subject to change. For the most up-to-date guidance from [facility], please refer to their website: YouBlogs.pl   Increase activity slowly   Complete by: As directed    MyChart COVID-19 home monitoring program   Complete by: Jun 16, 2019    Is the patient willing to use the [facility] for home monitoring?: Yes     Allergies as of 06/16/2019   No Known Allergies     Medication List    STOP taking these medications   lisinopril 20 MG tablet Commonly known as: ZESTRIL   tetrabenazine 12.5 MG tablet Commonly known as: XENAZINE     TAKE these medications   atorvastatin 10 MG tablet Commonly known as: LIPITOR Take 10 mg by mouth daily.   Austedo 6 MG Tabs Generic drug: Deutetrabenazine Take 6 mg by mouth 2 (two) times daily.   furosemide 20 MG tablet Commonly known as: LASIX Take 20 mg by mouth daily.   levothyroxine 100 MCG tablet Commonly known as: SYNTHROID Take 100 mcg by mouth daily before breakfast.   mycophenolate 500 MG tablet Commonly known as: CELLCEPT Take 500 mg by mouth 2 (two) times daily.   predniSONE 5 MG tablet Commonly known as: DELTASONE Take 1 tablet (5 mg total) by mouth daily with breakfast. restart 5mg  dose 3/10 after completing prednisone taper  (2/24 - 3/9) What changed: additional instructions   predniSONE 5 MG tablet Commonly known as: DELTASONE Take 2 tablets (10 mg total) by mouth daily with breakfast for 7 days, THEN 1.5 tablets (7.5 mg total) daily with breakfast for 7 days. Start taking on: June 17, 2019 What changed: You were already taking a medication with the same name, and this prescription was added. Make sure you understand how and when to take each.   tacrolimus 0.5 MG capsule Commonly known as: PROGRAF Take 1.5 mg by mouth 2 (two) times daily.   Tyler Aas FlexTouch 200 UNIT/ML Sopn Generic drug: Insulin Degludec Inject 20 Units into the skin daily.   Trulicity 1.5 VV/6.1YW Sopn Generic drug: Dulaglutide Inject 1.5 mg into the skin every 7 (seven) days.  Durable Medical Equipment  (From admission, onward)         Start     Ordered   06/16/19 0851  DME Oxygen  Once    Question Answer Comment  Length of Need 6 Months   Mode or (Route) Nasal cannula   Liters per Minute 4   Frequency Continuous (stationary and portable oxygen unit needed)   Oxygen delivery system Gas      06/16/19 0853   06/16/19 0754  For home use only DME Walker rolling  Once    Question Answer Comment  Walker: With 5 Inch Wheels   Patient needs a walker to treat with the following condition Gait instability      06/16/19 0753         Follow-up Information    Celene Squibb, MD .   Specialty: Internal Medicine Contact information: [place] [address] [phone]        Care, [facility] Follow up.   Specialty: Home Health Services Why: agency will provide home health physical and occupational therapy.  Contact information: [address] [place] [address] [phone]        [facility] Follow up.   Why: agency will provide home oxygen Contact information: [place] [address] [phone]          No Known  Allergies  Consultations:  PCCM  Procedures/Studies: CT ANGIO CHEST PE W OR WO CONTRAST  Result Date: 06/03/2019 CLINICAL DATA:  Respiratory failure. EXAM: CT ANGIOGRAPHY CHEST WITH CONTRAST TECHNIQUE: Multidetector CT imaging of the chest was performed using the standard protocol during bolus administration of intravenous contrast. Multiplanar CT image reconstructions and MIPs were obtained to evaluate the vascular anatomy. CONTRAST:  42mL OMNIPAQUE IOHEXOL 350 MG/ML SOLN COMPARISON:  None. FINDINGS: Cardiovascular: Satisfactory opacification of the pulmonary arteries to the segmental level. No evidence of pulmonary embolism. Normal heart size. No pericardial effusion. Coronary artery calcifications are noted. Mediastinum/Nodes: No enlarged mediastinal, hilar, or axillary lymph nodes. Thyroid gland, trachea, and esophagus demonstrate no significant findings. Lungs/Pleura: No pneumothorax or pleural effusion is noted. Multiple airspace opacities are noted throughout both lungs consistent with multifocal pneumonia, potentially of viral etiology. Upper Abdomen: No acute abnormality. Musculoskeletal: No chest wall abnormality. No acute or significant osseous findings. Review of the MIP images confirms the above findings. IMPRESSION: 1. No definite evidence of pulmonary embolus. 2. Coronary artery calcifications are noted suggesting coronary artery disease. 3. Multiple airspace opacities are noted throughout both lungs consistent with multifocal pneumonia, potentially of viral etiology. Electronically Signed   By: Marijo Conception M.D.   On: 06/03/2019 09:26   DG CHEST PORT 1 VIEW  Result Date: 06/16/2019 CLINICAL DATA:  Pneumothorax EXAM: PORTABLE CHEST 1 VIEW COMPARISON:  06/16/2019 FINDINGS: Bilateral patchy interstitial and alveolar airspace opacities. Small stable right apical pneumothorax. No significant pleural effusion. Stable heart size. Possible small volume pneumomediastinum. Extensive soft tissue  emphysema throughout the chest wall. IMPRESSION: 1. Bilateral patchy interstitial and alveolar airspace opacities as can be seen with multilobar pneumonia including atypical viral pneumonia. 2. Small stable right apical pneumothorax. Electronically Signed   By: Kathreen Devoid   On: 06/16/2019 10:56   DG CHEST PORT 1 VIEW  Result Date: 06/16/2019 CLINICAL DATA:  Pneumothorax. EXAM: PORTABLE CHEST 1 VIEW COMPARISON:  06/15/2019 FINDINGS: 6:06 a.m. Right pleural drain remains in place. Tiny right apical pneumothorax again noted, slightly decreased in the interval. The diffuse interstitial  and peripheral patchy airspace disease persists. Cardiopericardial silhouette is upper limits of normal for size and stable. There is diffuse subcutaneous emphysema bilaterally, similar to prior. IMPRESSION: Slight interval decrease in the tiny right apical pneumothorax. Similar subcutaneous emphysema bilaterally. Similar bilateral patchy airspace disease with a peripheral and left basilar predominance. Electronically Signed   By: Misty Stanley M.D.   On: 06/16/2019 09:15   DG Chest Port 1 View  Result Date: 06/15/2019 CLINICAL DATA:  Pneumothorax EXAM: PORTABLE CHEST - 1 VIEW COMPARISON:  the previous day's study FINDINGS: Right chest tube projects at the lung base as before. Small apical pneumothorax is slightly more conspicuous. There is extensive bilateral subcutaneous emphysema as before. Heart size normal. Patchy airspace opacities in the lung bases stable. No definite effusion. Visualized bones unremarkable. IMPRESSION: Slight increase in conspicuity of small right apical pneumothorax with stable chest tube. Stable subcutaneous emphysema and bibasilar airspace opacities. Electronically Signed   By: Lucrezia Europe M.D.   On: 06/15/2019 08:00   DG CHEST PORT 1 VIEW  Result Date: 06/14/2019 CLINICAL DATA:  Status post right chest tube placement EXAM: PORTABLE CHEST 1 VIEW COMPARISON:  Chest radiograph from one day prior.  FINDINGS: Right basilar pigtail chest tube is in place. Stable cardiomediastinal silhouette with normal heart size. Tiny residual right apical pneumothorax appears unchanged. No left pneumothorax. No mediastinal shift. No pleural effusion. Patchy opacities throughout the mid to lower lungs bilaterally are unchanged. Extensive subcutaneous emphysema throughout the lower neck and bilateral chest wall, unchanged. IMPRESSION: 1. Tiny residual right apical pneumothorax, unchanged. 2. Stable patchy opacities throughout the mid to lower lungs bilaterally compatible with COVID-19 pneumonia. 3. Stable extensive subcutaneous emphysema throughout the lower neck and bilateral chest wall. Electronically Signed   By: Ilona Sorrel M.D.   On: 06/14/2019 05:25   DG CHEST PORT 1 VIEW  Result Date: 06/13/2019 CLINICAL DATA:  Status post chest tube placement EXAM: PORTABLE CHEST 1 VIEW COMPARISON:  June 13, 2019 FINDINGS: There is been interval placement of a right-sided chest tube. The right-sided pneumothorax is no longer well appreciated. There may be a trace residual right-sided pneumothorax. Extensive subcutaneous emphysema is again noted and is similar to prior study. Again noted are multifocal airspace opacities bilaterally. The heart size is stable. IMPRESSION: 1. Trace residual right-sided pneumothorax status post placement of a right-sided chest tube. 2. Persistent diffuse subcutaneous emphysema. 3. No convincing left-sided pneumothorax. 4. Persistent diffuse bilateral airspace opacities are again noted. Electronically Signed   By: Constance Holster M.D.   On: 06/13/2019 18:39   DG CHEST PORT 1 VIEW  Result Date: 06/13/2019 CLINICAL DATA:  Acute respiratory failure with hypoxia. COVID 19 positive. EXAM: PORTABLE CHEST 1 VIEW COMPARISON:  05/31/2019 chest radiograph. FINDINGS: Extensive subcutaneous emphysema throughout bilateral chest wall and lower neck, new. Stable cardiomediastinal silhouette with normal heart  size. New small to moderate right pneumothorax, approximately 10-15%. No mediastinal shift. No left pneumothorax. No pleural effusion. Patchy opacities in the mid to lower lungs bilaterally appear worsened. IMPRESSION: 1. New small to moderate right pneumothorax, approximately 10-15%. No mediastinal shift. 2. New extensive subcutaneous emphysema throughout bilateral chest wall and lower neck. 3. Worsening patchy opacities in the mid to lower lungs bilaterally, compatible with COVID-19 pneumonia. Critical Value/emergent results were called by telephone at the time of interpretation on 06/13/2019 at 7:02 am to provider Vance Gather , who verbally acknowledged these results. Electronically Signed   By: Ilona Sorrel M.D.   On: 06/13/2019 07:08  Portable chest 1 View  Result Date: 05/31/2019 CLINICAL DATA:  Dyspnea EXAM: PORTABLE CHEST 1 VIEW COMPARISON:  None. FINDINGS: The heart size and mediastinal contours are within normal limits. Interstitial and irregular opacity of the bilateral lung bases. The visualized skeletal structures are unremarkable. IMPRESSION: Interstitial and irregular opacity of the bilateral lung bases, which may reflect edema or infection. There may be a component of underlying scarring or fibrosis. Electronically Signed   By: Eddie Candle M.D.   On: 05/31/2019 13:23   ECHOCARDIOGRAM COMPLETE  Result Date: 06/08/2019    ECHOCARDIOGRAM REPORT   Patient Name:   Nathan Tyler Date of Exam: 06/08/2019 Medical Rec #:  [id number]    Height:       71.0 in Accession #:    [id number]   Weight:       201.1 lb Date of Birth:  05-06-1955    BSA:          2.11 m Patient Age:    53 years     BP:           138/79 mmHg Patient Gender: M            HR:           61 bpm. Exam Location:  Inpatient Procedure: 2D Echo Indications:    CHF-Acute Diastolic 660.63 / K16.01  History:        Patient has no prior history of Echocardiogram examinations.                 Risk Factors:Diabetes and Hypertension. CKD. COVID-19.  Chorea.  Sonographer:    Clayton Lefort RDCS (AE) Referring Phys: [place]  Sonographer Comments: Technically difficult study due to poor echo windows. Image acquisition challenging due to respiratory motion. Echocardiogram performed at [facility]. IMPRESSIONS  1. Left ventricular ejection fraction, by estimation, is 55 to 60%. The left ventricle has normal function. LV endocardial border not optimally defined to evaluate regional wall motion. Left ventricular diastolic parameters are consistent with Grade I diastolic dysfunction (impaired relaxation).  2. Right ventricular systolic function is normal. The right ventricular size is normal.  3. The mitral valve is grossly normal. No evidence of mitral valve regurgitation. No evidence of mitral stenosis.  4. The aortic valve is grossly normal. Aortic valve regurgitation is not visualized. No aortic stenosis is present. Comparison(s): No prior Echocardiogram. Conclusion(s)/Recomendation(s): Difficult study. Grossly, LV function is normal. Unable to exclude subtle regional WMA due to poor study. FINDINGS  Left Ventricle: Left ventricular ejection fraction, by estimation, is 55 to 60%. The left ventricle has normal function. LV endocardial border not optimally defined to evaluate regional wall motion. There is no left ventricular hypertrophy. Left ventricular diastolic parameters are consistent with Grade I diastolic dysfunction (impaired relaxation). Normal left ventricular filling pressure. Right Ventricle: The right ventricular size is normal. No increase in right ventricular wall thickness. Right ventricular systolic function is normal. Left Atrium: Left atrial size was normal in size. Right Atrium: Right atrial size was normal in size. Pericardium: Trivial pericardial effusion is present. Presence of pericardial fat pad. Mitral Valve: The mitral valve is grossly normal. No evidence of mitral valve regurgitation. No evidence of mitral valve  stenosis. Tricuspid Valve: The tricuspid valve is grossly normal. Tricuspid valve regurgitation is not demonstrated. Aortic Valve: The aortic valve is grossly normal. Aortic valve regurgitation is not visualized. No aortic stenosis is present. Aortic valve mean gradient measures 2.0 mmHg. Aortic valve peak gradient  measures 4.7 mmHg. Aortic valve area, by VTI measures 2.30 cm. Pulmonic Valve: The pulmonic valve was grossly normal. Pulmonic valve regurgitation is not visualized. Aorta: The aortic root is normal in size and structure. Venous: The inferior vena cava was not well visualized. IAS/Shunts: No atrial level shunt detected by color flow Doppler.  LEFT VENTRICLE PLAX 2D LVIDd:         4.70 cm  Diastology LVIDs:         2.91 cm  LV e' lateral:   6.85 cm/s LV PW:         0.90 cm  LV E/e' lateral: 11.1 LV IVS:        0.97 cm  LV e' medial:    6.42 cm/s LVOT diam:     2.00 cm  LV E/e' medial:  11.8 LV SV:         51.52 ml LV SV Index:   32.43 LVOT Area:     3.14 cm  RIGHT VENTRICLE RV Basal diam:  2.54 cm RV S prime:     16.90 cm/s TAPSE (M-mode): 2.7 cm LEFT ATRIUM           Index       RIGHT ATRIUM           Index LA diam:      3.00 cm 1.42 cm/m  RA Area:     13.80 cm LA Vol (A2C): 32.5 ml 15.38 ml/m RA Volume:   30.40 ml  14.39 ml/m LA Vol (A4C): 40.9 ml 19.35 ml/m  AORTIC VALVE AV Area (Vmax):    2.15 cm AV Area (Vmean):   2.28 cm AV Area (VTI):     2.30 cm AV Vmax:           108.00 cm/s AV Vmean:          71.100 cm/s AV VTI:            0.224 m AV Peak Grad:      4.7 mmHg AV Mean Grad:      2.0 mmHg LVOT Vmax:         73.90 cm/s LVOT Vmean:        51.500 cm/s LVOT VTI:          0.164 m LVOT/AV VTI ratio: 0.73  AORTA Ao Root diam: 3.30 cm MITRAL VALVE MV Area (PHT): 2.66 cm    SHUNTS MV Decel Time: 285 msec    Systemic VTI:  0.16 m MV E velocity: 75.70 cm/s  Systemic Diam: 2.00 cm MV A velocity: 68.40 cm/s MV E/A ratio:  1.11 Eleonore Chiquito MD Electronically signed by Eleonore Chiquito MD Signature  Date/Time: 06/08/2019/1:12:21 PM    Final    Pigtail catheter (right) chest tube insertion 2/20, removal 2/23.  Subjective: Feels better, breathing is improving every day, getting around much better/walkiung farther with PT/OT. No chest or other pain, wants to go home.  Discharge Exam: Vitals:   06/16/19 0341 06/16/19 0726  BP: 114/84 (!) 141/92  Pulse: 64 75  Resp: (!) 21 20  Temp: (!) 97.3 F (36.3 C) 97.7 F (36.5 C)  SpO2: 95% 97%   General: Pt is alert, awake, not in acute distress Skin: Continues to have crepitance over chest, neck.  Cardiovascular: RRR, S1/S2 +, no rubs, no gallops Respiratory: Nonlabored with supplemental oxygen, crackles bilaterally are stable, good aeration throughout. Abdominal: Soft, NT, ND, bowel sounds + Extremities: No edema, no cyanosis  Labs: BNP (last 3 results) Recent Labs  05/31/19 1300 06/06/19 1950  BNP 102.9* 600.4*   Basic Metabolic Panel: Recent Labs  Lab 06/12/19 0302 06/13/19 0055 06/14/19 0530 06/15/19 0215 06/16/19 0210  NA 135 132* 137 138 138  K 5.2* 5.1 5.5* 5.2* 4.9  CL 103 100 106 106 107  CO2 24 24 24 25 24   GLUCOSE 272* 228* 205* 181* 109*  BUN 77* 75* 61* 67* 61*  CREATININE 2.09* 1.81* 1.53* 1.83* 1.57*  CALCIUM 8.6* 8.3* 8.8* 8.6* 8.7*   Liver Function Tests: Recent Labs  Lab 06/10/19 0334  AST 15  ALT 17  ALKPHOS 78  BILITOT 0.7  PROT 5.0*  ALBUMIN 2.5*   CBC: Recent Labs  Lab 06/11/19 0302  WBC 12.7*  HGB 12.3*  HCT 38.1*  MCV 90.5  PLT 383   CBG: Recent Labs  Lab 06/15/19 1128 06/15/19 1538 06/15/19 2035 06/16/19 0734 06/16/19 1126  GLUCAP 278* 182* 108* 81 193*   D-Dimer Recent Labs    06/14/19 0530 06/15/19 0215  DDIMER 1.48* 1.22*    Microbiology Recent Results (from the past 240 hour(s))  MRSA PCR Screening     Status: None   Collection Time: 06/13/19  9:31 AM   Specimen: Nasal Mucosa; Nasopharyngeal  Result Value Ref Range Status   MRSA by PCR NEGATIVE  NEGATIVE Final    Comment:        The GeneXpert MRSA Assay (FDA approved for NASAL specimens only), is one component of a comprehensive MRSA colonization surveillance program. It is not intended to diagnose MRSA infection nor to guide or monitor treatment for MRSA infections. Performed at [facility], [place] [address], [place], [place] [address]     Time coordinating discharge: Approximately 40 minutes  Patrecia Pour, MD  [facility] 06/16/2019, 1:41 PM

## 2019-06-16 NOTE — Progress Notes (Signed)
[facility] P[facility]M  CXR images personally reviewed: R pneumothorax very small, will order d/c chest tube and f/u CXR  Roselie Awkward, MD [place] [facility] Pager: [phone] Cell: [phone] If no response, call [phone]

## 2019-06-16 NOTE — Progress Notes (Signed)
Spiritual care consulted with pt during rounding due to length of stay.  Provided support at bedside.  Nathan Tyler in good spirits.  Shared connection around [facility] and [facility] and spoke with chaplain about joy of leaving hospital today.  He requested prayers - especially around scan for chest tomorrow.

## 2019-06-17 ENCOUNTER — Other Ambulatory Visit (HOSPITAL_COMMUNITY): Payer: Self-pay | Admitting: Internal Medicine

## 2019-06-17 ENCOUNTER — Other Ambulatory Visit: Payer: Self-pay

## 2019-06-17 DIAGNOSIS — U071 COVID-19: Secondary | ICD-10-CM

## 2019-06-18 ENCOUNTER — Ambulatory Visit
Admission: RE | Admit: 2019-06-18 | Discharge: 2019-06-18 | Disposition: A | Discharge status: Home / Self Care | Payer: Medicare Other | Source: Ambulatory Visit | Attending: Internal Medicine | Admitting: Internal Medicine

## 2019-06-18 ENCOUNTER — Other Ambulatory Visit: Payer: Self-pay

## 2019-06-18 DIAGNOSIS — U071 COVID-19: Secondary | ICD-10-CM

## 2019-06-18 DIAGNOSIS — J1282 Pneumonia due to coronavirus disease 2019: Secondary | ICD-10-CM

## 2019-06-18 DIAGNOSIS — J1289 Other viral pneumonia: Secondary | ICD-10-CM | POA: Diagnosis not present

## 2019-06-19 NOTE — Unmapped (Signed)
Neurology Clinical Pharmacist Telephone Call    Medication: Austedo    On 05/28/19, patient reported to [facility] Pharmacist that he felt Austedo 6 mg twice daily was beneficial, but not quite as effect as tetrabenazine was. Since this time, patient was admitted to [facility] for COVID-19 pneumonia from 2/7-2/23. Missed ~7 days of Austedo when first admitted and then restarted around 2/12 when patient's was able to have home supply of Austedo brought to hospital.     Today, he reports that he is feeling much better. He is satisfied with Austedo and feels it is now working as well as the tetrabenazine was. He does not feel he needs an increase in dose at this time. Therefore, no changes warranted and notified patient to contact clinic if he has any questions/concerns. Patient did not have any further questions.     Finis Bud, PharmD  PGY2 Ambulatory Care Pharmacy Resident    Worthy Flank, PharmD, CPP  Clinical Pharmacist, [facility]  Phone: [phone]

## 2019-06-21 DIAGNOSIS — Z20828 Contact with and (suspected) exposure to other viral communicable diseases: Secondary | ICD-10-CM | POA: Diagnosis not present

## 2019-06-22 ENCOUNTER — Other Ambulatory Visit: Payer: Self-pay | Admitting: *Deleted

## 2019-06-22 DIAGNOSIS — Z794 Long term (current) use of insulin: Secondary | ICD-10-CM | POA: Diagnosis not present

## 2019-06-22 DIAGNOSIS — N1832 Chronic kidney disease, stage 3b: Secondary | ICD-10-CM | POA: Diagnosis not present

## 2019-06-22 DIAGNOSIS — I5033 Acute on chronic diastolic (congestive) heart failure: Secondary | ICD-10-CM | POA: Diagnosis not present

## 2019-06-22 DIAGNOSIS — B182 Chronic viral hepatitis C: Secondary | ICD-10-CM | POA: Diagnosis not present

## 2019-06-22 DIAGNOSIS — U071 COVID-19: Secondary | ICD-10-CM | POA: Diagnosis not present

## 2019-06-22 DIAGNOSIS — Z6827 Body mass index (BMI) 27.0-27.9, adult: Secondary | ICD-10-CM | POA: Diagnosis not present

## 2019-06-22 DIAGNOSIS — E7800 Pure hypercholesterolemia, unspecified: Secondary | ICD-10-CM | POA: Diagnosis not present

## 2019-06-22 DIAGNOSIS — G255 Other chorea: Secondary | ICD-10-CM | POA: Diagnosis not present

## 2019-06-22 DIAGNOSIS — E1122 Type 2 diabetes mellitus with diabetic chronic kidney disease: Secondary | ICD-10-CM | POA: Diagnosis not present

## 2019-06-22 DIAGNOSIS — I2510 Atherosclerotic heart disease of native coronary artery without angina pectoris: Secondary | ICD-10-CM | POA: Diagnosis not present

## 2019-06-22 DIAGNOSIS — E039 Hypothyroidism, unspecified: Secondary | ICD-10-CM | POA: Diagnosis not present

## 2019-06-22 DIAGNOSIS — J1282 Pneumonia due to coronavirus disease 2019: Secondary | ICD-10-CM | POA: Diagnosis not present

## 2019-06-22 DIAGNOSIS — J939 Pneumothorax, unspecified: Secondary | ICD-10-CM | POA: Diagnosis not present

## 2019-06-22 DIAGNOSIS — Z89421 Acquired absence of other right toe(s): Secondary | ICD-10-CM | POA: Diagnosis not present

## 2019-06-22 DIAGNOSIS — Z86010 Personal history of colonic polyps: Secondary | ICD-10-CM | POA: Diagnosis not present

## 2019-06-22 DIAGNOSIS — Z940 Kidney transplant status: Secondary | ICD-10-CM | POA: Diagnosis not present

## 2019-06-22 DIAGNOSIS — E875 Hyperkalemia: Secondary | ICD-10-CM | POA: Diagnosis not present

## 2019-06-22 DIAGNOSIS — Z89422 Acquired absence of other left toe(s): Secondary | ICD-10-CM | POA: Diagnosis not present

## 2019-06-22 DIAGNOSIS — J80 Acute respiratory distress syndrome: Secondary | ICD-10-CM | POA: Diagnosis not present

## 2019-06-22 DIAGNOSIS — Z7952 Long term (current) use of systemic steroids: Secondary | ICD-10-CM | POA: Diagnosis not present

## 2019-06-22 DIAGNOSIS — E785 Hyperlipidemia, unspecified: Secondary | ICD-10-CM | POA: Diagnosis not present

## 2019-06-22 DIAGNOSIS — I130 Hypertensive heart and chronic kidney disease with heart failure and stage 1 through stage 4 chronic kidney disease, or unspecified chronic kidney disease: Secondary | ICD-10-CM | POA: Diagnosis not present

## 2019-06-22 DIAGNOSIS — F419 Anxiety disorder, unspecified: Secondary | ICD-10-CM | POA: Diagnosis not present

## 2019-06-22 DIAGNOSIS — E663 Overweight: Secondary | ICD-10-CM | POA: Diagnosis not present

## 2019-06-22 DIAGNOSIS — Z9981 Dependence on supplemental oxygen: Secondary | ICD-10-CM | POA: Diagnosis not present

## 2019-06-22 NOTE — Patient Outreach (Signed)
[facility]) Care Management  06/22/2019  NEDIM OKI Dec 31, 1955 [id number]   RED ON EMMI ALERT - Pneumonia Day # 2 Date: 2/26 Red Alert Reason: More short of breath than yesterday and more wheezing than yesterday   RED ON EMMI ALERT - Pneumonia Day # 3 Date: 2/27 Red Alert Reason: No follow up   Outreach attempt #1, unsuccessful.  HIPAA compliant voice message left.   Plan: RN CM will send unsuccessful outreach letter and follow up within the next 3-4 business days.  Valente David, [place], MSN [place] [phone]

## 2019-06-23 DIAGNOSIS — J80 Acute respiratory distress syndrome: Secondary | ICD-10-CM | POA: Diagnosis not present

## 2019-06-23 DIAGNOSIS — J939 Pneumothorax, unspecified: Secondary | ICD-10-CM | POA: Diagnosis not present

## 2019-06-23 DIAGNOSIS — E875 Hyperkalemia: Secondary | ICD-10-CM | POA: Diagnosis not present

## 2019-06-23 DIAGNOSIS — J1282 Pneumonia due to coronavirus disease 2019: Secondary | ICD-10-CM | POA: Diagnosis not present

## 2019-06-23 DIAGNOSIS — U071 COVID-19: Secondary | ICD-10-CM | POA: Diagnosis not present

## 2019-06-23 DIAGNOSIS — I2510 Atherosclerotic heart disease of native coronary artery without angina pectoris: Secondary | ICD-10-CM | POA: Diagnosis not present

## 2019-06-24 DIAGNOSIS — J939 Pneumothorax, unspecified: Secondary | ICD-10-CM | POA: Diagnosis not present

## 2019-06-24 DIAGNOSIS — J80 Acute respiratory distress syndrome: Secondary | ICD-10-CM | POA: Diagnosis not present

## 2019-06-24 DIAGNOSIS — I2510 Atherosclerotic heart disease of native coronary artery without angina pectoris: Secondary | ICD-10-CM | POA: Diagnosis not present

## 2019-06-24 DIAGNOSIS — J1282 Pneumonia due to coronavirus disease 2019: Secondary | ICD-10-CM | POA: Diagnosis not present

## 2019-06-24 DIAGNOSIS — E875 Hyperkalemia: Secondary | ICD-10-CM | POA: Diagnosis not present

## 2019-06-24 DIAGNOSIS — U071 COVID-19: Secondary | ICD-10-CM | POA: Diagnosis not present

## 2019-06-25 ENCOUNTER — Other Ambulatory Visit: Payer: Self-pay | Admitting: *Deleted

## 2019-06-25 ENCOUNTER — Encounter (HOSPITAL_COMMUNITY): Payer: Self-pay

## 2019-06-25 DIAGNOSIS — E119 Type 2 diabetes mellitus without complications: Secondary | ICD-10-CM | POA: Diagnosis not present

## 2019-06-25 DIAGNOSIS — E1165 Type 2 diabetes mellitus with hyperglycemia: Secondary | ICD-10-CM | POA: Diagnosis not present

## 2019-06-25 DIAGNOSIS — J9601 Acute respiratory failure with hypoxia: Secondary | ICD-10-CM | POA: Diagnosis not present

## 2019-06-25 DIAGNOSIS — Z940 Kidney transplant status: Secondary | ICD-10-CM | POA: Diagnosis not present

## 2019-06-25 DIAGNOSIS — Z7409 Other reduced mobility: Secondary | ICD-10-CM | POA: Diagnosis not present

## 2019-06-25 DIAGNOSIS — E46 Unspecified protein-calorie malnutrition: Secondary | ICD-10-CM | POA: Diagnosis not present

## 2019-06-25 DIAGNOSIS — J9312 Secondary spontaneous pneumothorax: Secondary | ICD-10-CM | POA: Diagnosis not present

## 2019-06-25 DIAGNOSIS — E875 Hyperkalemia: Secondary | ICD-10-CM | POA: Diagnosis not present

## 2019-06-25 DIAGNOSIS — D649 Anemia, unspecified: Secondary | ICD-10-CM | POA: Diagnosis not present

## 2019-06-25 DIAGNOSIS — Z86010 Personal history of colonic polyps: Secondary | ICD-10-CM | POA: Diagnosis not present

## 2019-06-25 DIAGNOSIS — E039 Hypothyroidism, unspecified: Secondary | ICD-10-CM | POA: Diagnosis not present

## 2019-06-25 DIAGNOSIS — N183 Chronic kidney disease, stage 3 unspecified: Secondary | ICD-10-CM | POA: Diagnosis not present

## 2019-06-25 DIAGNOSIS — I5033 Acute on chronic diastolic (congestive) heart failure: Secondary | ICD-10-CM | POA: Diagnosis not present

## 2019-06-25 DIAGNOSIS — N1831 Chronic kidney disease, stage 3a: Secondary | ICD-10-CM | POA: Diagnosis not present

## 2019-06-25 DIAGNOSIS — N1832 Chronic kidney disease, stage 3b: Secondary | ICD-10-CM | POA: Diagnosis not present

## 2019-06-25 DIAGNOSIS — Z0189 Encounter for other specified special examinations: Secondary | ICD-10-CM | POA: Diagnosis not present

## 2019-06-25 DIAGNOSIS — E1169 Type 2 diabetes mellitus with other specified complication: Secondary | ICD-10-CM | POA: Diagnosis not present

## 2019-06-25 DIAGNOSIS — Z8616 Personal history of COVID-19: Secondary | ICD-10-CM | POA: Diagnosis not present

## 2019-06-25 DIAGNOSIS — N189 Chronic kidney disease, unspecified: Secondary | ICD-10-CM | POA: Diagnosis not present

## 2019-06-25 DIAGNOSIS — R0602 Shortness of breath: Secondary | ICD-10-CM | POA: Diagnosis not present

## 2019-06-25 DIAGNOSIS — N529 Male erectile dysfunction, unspecified: Secondary | ICD-10-CM | POA: Diagnosis not present

## 2019-06-25 DIAGNOSIS — Z8619 Personal history of other infectious and parasitic diseases: Secondary | ICD-10-CM | POA: Diagnosis not present

## 2019-06-25 DIAGNOSIS — Z0489 Encounter for examination and observation for other specified reasons: Secondary | ICD-10-CM | POA: Diagnosis not present

## 2019-06-25 NOTE — Unmapped (Signed)
[facility] Specialty Pharmacy Refill Coordination Note    Specialty Medication(s) to be Shipped:   Neurology: [name]    Other medication(s) to be shipped: n/a     Gary Rogers, DOB: 01-31-1956  Phone: [phone] (home)       All above HIPAA information was verified with patient.     Was a Nurse, learning disability used for this call? No    Completed refill call assessment today to schedule patient's medication shipment from the [facility] [phone]).       Specialty medication(s) and dose(s) confirmed: Regimen is correct and unchanged.   Changes to medications: [name] reports no changes at this time.  Changes to insurance: No  Questions for the pharmacist: No    Confirmed patient received Welcome Packet with first shipment. The patient will receive a drug information handout for each medication shipped and additional FDA Medication Guides as required.       DISEASE/MEDICATION-SPECIFIC INFORMATION        N/A    SPECIALTY MEDICATION ADHERENCE     Medication Adherence    Patient reported X missed doses in the last month: 0  Specialty Medication: Austedo  Patient is on additional specialty medications: No  Patient is on more than two specialty medications: No  Any gaps in refill history greater than 2 weeks in the last 3 months: no  Demonstrates understanding of importance of adherence: yes  Informant: patient                Austedo 6mg : Patient has 14 days of medication on hand      SHIPPING     Shipping address confirmed in Epic.     Delivery Scheduled: Yes, Expected medication delivery date: 3/16.     Medication will be delivered via UPS to the prescription address in Epic WAM.    [name]   [facility] Pharmacy Specialty Technician

## 2019-06-25 NOTE — Patient Outreach (Signed)
[facility]) Care Management  06/25/2019  Nathan Tyler [birth year] [id number]   RED ON EMMI ALERT - Pneumonia Day # 2 Date: 2/26 Red Alert Reason: More short of breath than yesterday and more wheezing than yesterday   RED ON EMMI ALERT - Pneumonia Day # 3 Date: 2/27 Red Alert Reason: No follow up    RED ON EMMI ALERT - Pneumonia Day # 7 Date: 3/3 Red Alert Reason: Having diarrhea or sick to stomach, lost interest in things normally enjoy   This care manager received third EMMI notification for member, reasons all listed above.    Outreach attempt #2, unsuccessful.  HIPAA compliant voice message left.   Plan: RN CM will follow up within the next 3-4 business days.  Valente David, [place], MSN [place] [phone]

## 2019-06-26 DIAGNOSIS — J1282 Pneumonia due to coronavirus disease 2019: Secondary | ICD-10-CM | POA: Diagnosis not present

## 2019-06-26 DIAGNOSIS — U071 COVID-19: Secondary | ICD-10-CM | POA: Diagnosis not present

## 2019-06-26 DIAGNOSIS — J80 Acute respiratory distress syndrome: Secondary | ICD-10-CM | POA: Diagnosis not present

## 2019-06-26 DIAGNOSIS — J939 Pneumothorax, unspecified: Secondary | ICD-10-CM | POA: Diagnosis not present

## 2019-06-26 DIAGNOSIS — I2510 Atherosclerotic heart disease of native coronary artery without angina pectoris: Secondary | ICD-10-CM | POA: Diagnosis not present

## 2019-06-26 DIAGNOSIS — E875 Hyperkalemia: Secondary | ICD-10-CM | POA: Diagnosis not present

## 2019-06-29 ENCOUNTER — Other Ambulatory Visit: Payer: Self-pay | Admitting: *Deleted

## 2019-06-29 DIAGNOSIS — J939 Pneumothorax, unspecified: Secondary | ICD-10-CM | POA: Diagnosis not present

## 2019-06-29 DIAGNOSIS — U071 COVID-19: Secondary | ICD-10-CM | POA: Diagnosis not present

## 2019-06-29 DIAGNOSIS — I2510 Atherosclerotic heart disease of native coronary artery without angina pectoris: Secondary | ICD-10-CM | POA: Diagnosis not present

## 2019-06-29 DIAGNOSIS — J80 Acute respiratory distress syndrome: Secondary | ICD-10-CM | POA: Diagnosis not present

## 2019-06-29 DIAGNOSIS — J1282 Pneumonia due to coronavirus disease 2019: Secondary | ICD-10-CM | POA: Diagnosis not present

## 2019-06-29 DIAGNOSIS — E875 Hyperkalemia: Secondary | ICD-10-CM | POA: Diagnosis not present

## 2019-06-29 NOTE — Patient Outreach (Signed)
[facility]) Care Management  06/29/2019  MAHAMADOU WELTZ May 14, 1955 [id number]   RED ON EMMI ALERT- Pneumonia Day #2 Date:2/26 Red Alert Reason:More short of breath than yesterday and more wheezing than yesterday   RED ON EMMI ALERT- Pneumonia Day #3 Date:2/27 Red Alert Reason:No follow up   RED ON [place] - Pneumonia Day # 7 Date: 3/3 Red Alert Reason: Having diarrhea or sick to stomach, lost interest in things normally enjoy   RED ON EMMI ALERT - Pneumonia Day # 9 Date: 3/5 Red Alert Reason: More wheezing than yesterday, not back to pre-sick activity   Outreach attempt #3, successful, identity verified.  This care manager introduced self and stated purpose of call.  [facility] care management services explained.  He report he is doing well, denies that he has had any ongoing wheezing or other problems as EMMI dashboard has stated.  He has been seen by his PCP and report they have an established plan of care that he is following, will have follow up next month.  He lives with his girlfriend who provide support to him when needed.  Report all chronic medical conditions are well managed, denies the need for ongoing services.   Plan: RN CM will not open case as member report he is managing his condition at this time.  No further nursing needs identified.  Valente David, [place], MSN [place] [phone]

## 2019-07-01 ENCOUNTER — Ambulatory Visit: Payer: Self-pay | Admitting: *Deleted

## 2019-07-01 DIAGNOSIS — U071 COVID-19: Secondary | ICD-10-CM | POA: Diagnosis not present

## 2019-07-01 DIAGNOSIS — E875 Hyperkalemia: Secondary | ICD-10-CM | POA: Diagnosis not present

## 2019-07-01 DIAGNOSIS — I2510 Atherosclerotic heart disease of native coronary artery without angina pectoris: Secondary | ICD-10-CM | POA: Diagnosis not present

## 2019-07-01 DIAGNOSIS — J939 Pneumothorax, unspecified: Secondary | ICD-10-CM | POA: Diagnosis not present

## 2019-07-01 DIAGNOSIS — J1282 Pneumonia due to coronavirus disease 2019: Secondary | ICD-10-CM | POA: Diagnosis not present

## 2019-07-01 DIAGNOSIS — J80 Acute respiratory distress syndrome: Secondary | ICD-10-CM | POA: Diagnosis not present

## 2019-07-02 DIAGNOSIS — J1282 Pneumonia due to coronavirus disease 2019: Secondary | ICD-10-CM | POA: Diagnosis not present

## 2019-07-02 DIAGNOSIS — U071 COVID-19: Secondary | ICD-10-CM | POA: Diagnosis not present

## 2019-07-02 DIAGNOSIS — J80 Acute respiratory distress syndrome: Secondary | ICD-10-CM | POA: Diagnosis not present

## 2019-07-02 DIAGNOSIS — E875 Hyperkalemia: Secondary | ICD-10-CM | POA: Diagnosis not present

## 2019-07-02 DIAGNOSIS — I2510 Atherosclerotic heart disease of native coronary artery without angina pectoris: Secondary | ICD-10-CM | POA: Diagnosis not present

## 2019-07-02 DIAGNOSIS — J939 Pneumothorax, unspecified: Secondary | ICD-10-CM | POA: Diagnosis not present

## 2019-07-03 DIAGNOSIS — N189 Chronic kidney disease, unspecified: Secondary | ICD-10-CM | POA: Diagnosis not present

## 2019-07-03 DIAGNOSIS — Z940 Kidney transplant status: Secondary | ICD-10-CM | POA: Diagnosis not present

## 2019-07-06 DIAGNOSIS — J939 Pneumothorax, unspecified: Secondary | ICD-10-CM | POA: Diagnosis not present

## 2019-07-06 DIAGNOSIS — E875 Hyperkalemia: Secondary | ICD-10-CM | POA: Diagnosis not present

## 2019-07-06 DIAGNOSIS — J80 Acute respiratory distress syndrome: Secondary | ICD-10-CM | POA: Diagnosis not present

## 2019-07-06 DIAGNOSIS — J1282 Pneumonia due to coronavirus disease 2019: Secondary | ICD-10-CM | POA: Diagnosis not present

## 2019-07-06 DIAGNOSIS — I2510 Atherosclerotic heart disease of native coronary artery without angina pectoris: Secondary | ICD-10-CM | POA: Diagnosis not present

## 2019-07-06 DIAGNOSIS — U071 COVID-19: Secondary | ICD-10-CM | POA: Diagnosis not present

## 2019-07-06 MED FILL — AUSTEDO 6 MG TABLET: ORAL | 30 days supply | Qty: 60 | Fill #2

## 2019-07-06 MED FILL — AUSTEDO 6 MG TABLET: 30 days supply | Qty: 60 | Fill #2 | Status: AC

## 2019-07-07 DIAGNOSIS — U071 COVID-19: Secondary | ICD-10-CM | POA: Diagnosis not present

## 2019-07-07 DIAGNOSIS — J80 Acute respiratory distress syndrome: Secondary | ICD-10-CM | POA: Diagnosis not present

## 2019-07-07 DIAGNOSIS — E875 Hyperkalemia: Secondary | ICD-10-CM | POA: Diagnosis not present

## 2019-07-07 DIAGNOSIS — J1282 Pneumonia due to coronavirus disease 2019: Secondary | ICD-10-CM | POA: Diagnosis not present

## 2019-07-07 DIAGNOSIS — J939 Pneumothorax, unspecified: Secondary | ICD-10-CM | POA: Diagnosis not present

## 2019-07-07 DIAGNOSIS — I2510 Atherosclerotic heart disease of native coronary artery without angina pectoris: Secondary | ICD-10-CM | POA: Diagnosis not present

## 2019-07-08 DIAGNOSIS — J80 Acute respiratory distress syndrome: Secondary | ICD-10-CM | POA: Diagnosis not present

## 2019-07-08 DIAGNOSIS — U071 COVID-19: Secondary | ICD-10-CM | POA: Diagnosis not present

## 2019-07-08 DIAGNOSIS — E875 Hyperkalemia: Secondary | ICD-10-CM | POA: Diagnosis not present

## 2019-07-08 DIAGNOSIS — J1282 Pneumonia due to coronavirus disease 2019: Secondary | ICD-10-CM | POA: Diagnosis not present

## 2019-07-08 DIAGNOSIS — I2510 Atherosclerotic heart disease of native coronary artery without angina pectoris: Secondary | ICD-10-CM | POA: Diagnosis not present

## 2019-07-08 DIAGNOSIS — J939 Pneumothorax, unspecified: Secondary | ICD-10-CM | POA: Diagnosis not present

## 2019-07-13 DIAGNOSIS — J1282 Pneumonia due to coronavirus disease 2019: Secondary | ICD-10-CM | POA: Diagnosis not present

## 2019-07-13 DIAGNOSIS — I2510 Atherosclerotic heart disease of native coronary artery without angina pectoris: Secondary | ICD-10-CM | POA: Diagnosis not present

## 2019-07-13 DIAGNOSIS — U071 COVID-19: Secondary | ICD-10-CM | POA: Diagnosis not present

## 2019-07-13 DIAGNOSIS — J80 Acute respiratory distress syndrome: Secondary | ICD-10-CM | POA: Diagnosis not present

## 2019-07-13 DIAGNOSIS — J939 Pneumothorax, unspecified: Secondary | ICD-10-CM | POA: Diagnosis not present

## 2019-07-13 DIAGNOSIS — E875 Hyperkalemia: Secondary | ICD-10-CM | POA: Diagnosis not present

## 2019-07-16 ENCOUNTER — Ambulatory Visit
Admission: RE | Admit: 2019-07-16 | Discharge: 2019-07-16 | Disposition: A | Discharge status: Home / Self Care | Payer: Medicare Other | Source: Ambulatory Visit | Attending: Internal Medicine | Admitting: Internal Medicine

## 2019-07-16 ENCOUNTER — Other Ambulatory Visit: Payer: Self-pay | Admitting: Internal Medicine

## 2019-07-16 ENCOUNTER — Other Ambulatory Visit: Payer: Self-pay

## 2019-07-16 DIAGNOSIS — J9611 Chronic respiratory failure with hypoxia: Secondary | ICD-10-CM

## 2019-07-16 DIAGNOSIS — J9690 Respiratory failure, unspecified, unspecified whether with hypoxia or hypercapnia: Secondary | ICD-10-CM | POA: Diagnosis not present

## 2019-07-20 DIAGNOSIS — J80 Acute respiratory distress syndrome: Secondary | ICD-10-CM | POA: Diagnosis not present

## 2019-07-20 DIAGNOSIS — U071 COVID-19: Secondary | ICD-10-CM | POA: Diagnosis not present

## 2019-07-20 DIAGNOSIS — E875 Hyperkalemia: Secondary | ICD-10-CM | POA: Diagnosis not present

## 2019-07-20 DIAGNOSIS — J1282 Pneumonia due to coronavirus disease 2019: Secondary | ICD-10-CM | POA: Diagnosis not present

## 2019-07-20 DIAGNOSIS — J939 Pneumothorax, unspecified: Secondary | ICD-10-CM | POA: Diagnosis not present

## 2019-07-20 DIAGNOSIS — I2510 Atherosclerotic heart disease of native coronary artery without angina pectoris: Secondary | ICD-10-CM | POA: Diagnosis not present

## 2019-07-22 DIAGNOSIS — U071 COVID-19: Secondary | ICD-10-CM | POA: Diagnosis not present

## 2019-07-22 DIAGNOSIS — E663 Overweight: Secondary | ICD-10-CM | POA: Diagnosis not present

## 2019-07-22 DIAGNOSIS — J1282 Pneumonia due to coronavirus disease 2019: Secondary | ICD-10-CM | POA: Diagnosis not present

## 2019-07-22 DIAGNOSIS — Z940 Kidney transplant status: Secondary | ICD-10-CM | POA: Diagnosis not present

## 2019-07-22 DIAGNOSIS — Z86010 Personal history of colonic polyps: Secondary | ICD-10-CM | POA: Diagnosis not present

## 2019-07-22 DIAGNOSIS — Z89421 Acquired absence of other right toe(s): Secondary | ICD-10-CM | POA: Diagnosis not present

## 2019-07-22 DIAGNOSIS — Z6827 Body mass index (BMI) 27.0-27.9, adult: Secondary | ICD-10-CM | POA: Diagnosis not present

## 2019-07-22 DIAGNOSIS — E785 Hyperlipidemia, unspecified: Secondary | ICD-10-CM | POA: Diagnosis not present

## 2019-07-22 DIAGNOSIS — N1832 Chronic kidney disease, stage 3b: Secondary | ICD-10-CM | POA: Diagnosis not present

## 2019-07-22 DIAGNOSIS — J80 Acute respiratory distress syndrome: Secondary | ICD-10-CM | POA: Diagnosis not present

## 2019-07-22 DIAGNOSIS — E875 Hyperkalemia: Secondary | ICD-10-CM | POA: Diagnosis not present

## 2019-07-22 DIAGNOSIS — I2510 Atherosclerotic heart disease of native coronary artery without angina pectoris: Secondary | ICD-10-CM | POA: Diagnosis not present

## 2019-07-22 DIAGNOSIS — I5033 Acute on chronic diastolic (congestive) heart failure: Secondary | ICD-10-CM | POA: Diagnosis not present

## 2019-07-22 DIAGNOSIS — G255 Other chorea: Secondary | ICD-10-CM | POA: Diagnosis not present

## 2019-07-22 DIAGNOSIS — Z794 Long term (current) use of insulin: Secondary | ICD-10-CM | POA: Diagnosis not present

## 2019-07-22 DIAGNOSIS — Z89422 Acquired absence of other left toe(s): Secondary | ICD-10-CM | POA: Diagnosis not present

## 2019-07-22 DIAGNOSIS — E039 Hypothyroidism, unspecified: Secondary | ICD-10-CM | POA: Diagnosis not present

## 2019-07-22 DIAGNOSIS — J939 Pneumothorax, unspecified: Secondary | ICD-10-CM | POA: Diagnosis not present

## 2019-07-22 DIAGNOSIS — Z7952 Long term (current) use of systemic steroids: Secondary | ICD-10-CM | POA: Diagnosis not present

## 2019-07-22 DIAGNOSIS — B182 Chronic viral hepatitis C: Secondary | ICD-10-CM | POA: Diagnosis not present

## 2019-07-22 DIAGNOSIS — E7800 Pure hypercholesterolemia, unspecified: Secondary | ICD-10-CM | POA: Diagnosis not present

## 2019-07-22 DIAGNOSIS — F419 Anxiety disorder, unspecified: Secondary | ICD-10-CM | POA: Diagnosis not present

## 2019-07-22 DIAGNOSIS — I130 Hypertensive heart and chronic kidney disease with heart failure and stage 1 through stage 4 chronic kidney disease, or unspecified chronic kidney disease: Secondary | ICD-10-CM | POA: Diagnosis not present

## 2019-07-22 DIAGNOSIS — Z9981 Dependence on supplemental oxygen: Secondary | ICD-10-CM | POA: Diagnosis not present

## 2019-07-22 DIAGNOSIS — E1122 Type 2 diabetes mellitus with diabetic chronic kidney disease: Secondary | ICD-10-CM | POA: Diagnosis not present

## 2019-07-29 DIAGNOSIS — J939 Pneumothorax, unspecified: Secondary | ICD-10-CM | POA: Diagnosis not present

## 2019-07-29 DIAGNOSIS — J80 Acute respiratory distress syndrome: Secondary | ICD-10-CM | POA: Diagnosis not present

## 2019-07-29 DIAGNOSIS — U071 COVID-19: Secondary | ICD-10-CM | POA: Diagnosis not present

## 2019-07-29 DIAGNOSIS — E875 Hyperkalemia: Secondary | ICD-10-CM | POA: Diagnosis not present

## 2019-07-29 DIAGNOSIS — J1282 Pneumonia due to coronavirus disease 2019: Secondary | ICD-10-CM | POA: Diagnosis not present

## 2019-07-29 DIAGNOSIS — I2510 Atherosclerotic heart disease of native coronary artery without angina pectoris: Secondary | ICD-10-CM | POA: Diagnosis not present

## 2019-07-30 DIAGNOSIS — G255 Other chorea: Principal | ICD-10-CM

## 2019-07-30 DIAGNOSIS — G2401 Drug induced subacute dyskinesia: Principal | ICD-10-CM

## 2019-07-30 NOTE — Unmapped (Signed)
[facility] Special[facility] Pharmacy Refill Coordination Note    Specialty Medication(s) to be Shipped:   Neurology: [name]    Other medication(s) to be shipped: n/a     Gary Rogers, DOB: 1955/09/08  Phone: [phone] (home)       All above HIPAA information was verified with patient.     Was a Nurse, learning disability used for this call? No    Completed refill call assessment today to schedule patient's medication shipment from the [facility] [phone]).       Specialty medication(s) and dose(s) confirmed: Regimen is correct and unchanged.   Changes to medications: Gary Rogers reports no changes at this time.  Changes to insurance: No  Questions for the pharmacist: No    Confirmed patient received Welcome Packet with first shipment. The patient will receive a drug information handout for each medication shipped and additional FDA Medication Guides as required.       DISEASE/MEDICATION-SPECIFIC INFORMATION        N/A    SPECIALTY MEDICATION ADHERENCE     Medication Adherence    Patient reported X missed doses in the last month: 0  Specialty Medication: Austedo  Patient is on additional specialty medications: No  Patient is on more than two specialty medications: No  Any gaps in refill history greater than 2 weeks in the last 3 months: no  Demonstrates understanding of importance of adherence: yes  Informant: patient                Austedo 6mg : Patient has 10 days of medication on hand      SHIPPING     Shipping address confirmed in Epic.     Delivery Scheduled: Yes, Expected medication delivery date: 4/15.  However, Rx request for refills was sent to the provider as there are none remaining.     Medication will be delivered via UPS to the prescription address in Epic WAM.    Gary Rogers   [facility] Pharmacy Specialty Technician

## 2019-07-31 NOTE — Unmapped (Signed)
Last Visit Date: 02/23/2019  Next Visit Date: Visit date not found    No results found for: CBC, CMP     Results for orders placed or performed in visit on 04/07/18   ECG 12 lead   Result Value Ref Range    EKG Systolic BP  mmHg    EKG Diastolic BP  mmHg    EKG Ventricular Rate 71 BPM    EKG Atrial Rate 71 BPM    EKG P-R Interval 144 ms    EKG QRS Duration 88 ms    EKG Q-T Interval 366 ms    EKG QTC Calculation 397 ms    EKG Calculated P Axis 64 degrees    EKG Calculated R Axis 21 degrees    EKG Calculated T Axis 67 degrees    QTC Fredericia 387 ms

## 2019-08-03 DIAGNOSIS — E875 Hyperkalemia: Secondary | ICD-10-CM | POA: Diagnosis not present

## 2019-08-03 DIAGNOSIS — J80 Acute respiratory distress syndrome: Secondary | ICD-10-CM | POA: Diagnosis not present

## 2019-08-03 DIAGNOSIS — U071 COVID-19: Secondary | ICD-10-CM | POA: Diagnosis not present

## 2019-08-03 DIAGNOSIS — J939 Pneumothorax, unspecified: Secondary | ICD-10-CM | POA: Diagnosis not present

## 2019-08-03 DIAGNOSIS — J1282 Pneumonia due to coronavirus disease 2019: Secondary | ICD-10-CM | POA: Diagnosis not present

## 2019-08-03 DIAGNOSIS — I2510 Atherosclerotic heart disease of native coronary artery without angina pectoris: Secondary | ICD-10-CM | POA: Diagnosis not present

## 2019-08-04 MED ORDER — AUSTEDO 6 MG TABLET
ORAL_TABLET | Freq: Two times a day (BID) | ORAL | 2 refills | 30 days | Status: CP
Start: 2019-08-04 — End: ?
  Filled 2019-08-06: qty 60, 30d supply, fill #0

## 2019-08-04 NOTE — Unmapped (Signed)
Refilling chronic medication prescription of   Requested Prescriptions     Pending Prescriptions Disp Refills   ??? deutetrabenazine (AUSTEDO) 6 mg Tab 60 tablet 2     Sig: Take 1 tablet by mouth two (2) times a day.   Rhina Brackett to requested pharmacy.

## 2019-08-05 NOTE — Unmapped (Signed)
Gary Rogers 's Austedo shipment will be delayed due to Insufficient inventory We have contacted the patient and communicated the delivery change to patient/caregiver We will reschedule the medication for the delivery date that the patient agreed upon. We have confirmed the delivery date as 08/07/19.

## 2019-08-06 MED FILL — AUSTEDO 6 MG TABLET: 30 days supply | Qty: 60 | Fill #0 | Status: AC

## 2019-08-14 DIAGNOSIS — G259 Extrapyramidal and movement disorder, unspecified: Secondary | ICD-10-CM | POA: Diagnosis not present

## 2019-08-14 DIAGNOSIS — B351 Tinea unguium: Secondary | ICD-10-CM | POA: Diagnosis not present

## 2019-08-14 DIAGNOSIS — E782 Mixed hyperlipidemia: Secondary | ICD-10-CM | POA: Diagnosis not present

## 2019-08-14 DIAGNOSIS — E1169 Type 2 diabetes mellitus with other specified complication: Secondary | ICD-10-CM | POA: Diagnosis not present

## 2019-08-14 DIAGNOSIS — B1710 Acute hepatitis C without hepatic coma: Secondary | ICD-10-CM | POA: Diagnosis not present

## 2019-08-14 DIAGNOSIS — E875 Hyperkalemia: Secondary | ICD-10-CM | POA: Diagnosis not present

## 2019-08-14 DIAGNOSIS — D649 Anemia, unspecified: Secondary | ICD-10-CM | POA: Diagnosis not present

## 2019-08-14 DIAGNOSIS — E46 Unspecified protein-calorie malnutrition: Secondary | ICD-10-CM | POA: Diagnosis not present

## 2019-08-14 DIAGNOSIS — E119 Type 2 diabetes mellitus without complications: Secondary | ICD-10-CM | POA: Diagnosis not present

## 2019-08-14 DIAGNOSIS — E1165 Type 2 diabetes mellitus with hyperglycemia: Secondary | ICD-10-CM | POA: Diagnosis not present

## 2019-08-14 DIAGNOSIS — B1920 Unspecified viral hepatitis C without hepatic coma: Secondary | ICD-10-CM | POA: Diagnosis not present

## 2019-08-14 DIAGNOSIS — E1122 Type 2 diabetes mellitus with diabetic chronic kidney disease: Secondary | ICD-10-CM | POA: Diagnosis not present

## 2019-08-14 DIAGNOSIS — E1142 Type 2 diabetes mellitus with diabetic polyneuropathy: Secondary | ICD-10-CM | POA: Diagnosis not present

## 2019-08-14 DIAGNOSIS — E039 Hypothyroidism, unspecified: Secondary | ICD-10-CM | POA: Diagnosis not present

## 2019-08-19 ENCOUNTER — Ambulatory Visit: Payer: Medicare Other | Attending: Internal Medicine

## 2019-08-19 DIAGNOSIS — E1169 Type 2 diabetes mellitus with other specified complication: Secondary | ICD-10-CM | POA: Diagnosis not present

## 2019-08-19 DIAGNOSIS — N1831 Chronic kidney disease, stage 3a: Secondary | ICD-10-CM | POA: Diagnosis not present

## 2019-08-19 DIAGNOSIS — Z23 Encounter for immunization: Secondary | ICD-10-CM

## 2019-08-19 DIAGNOSIS — Z8619 Personal history of other infectious and parasitic diseases: Secondary | ICD-10-CM | POA: Diagnosis not present

## 2019-08-19 DIAGNOSIS — E875 Hyperkalemia: Secondary | ICD-10-CM | POA: Diagnosis not present

## 2019-08-19 DIAGNOSIS — E46 Unspecified protein-calorie malnutrition: Secondary | ICD-10-CM | POA: Diagnosis not present

## 2019-08-19 DIAGNOSIS — Z7409 Other reduced mobility: Secondary | ICD-10-CM | POA: Diagnosis not present

## 2019-08-19 DIAGNOSIS — E039 Hypothyroidism, unspecified: Secondary | ICD-10-CM | POA: Diagnosis not present

## 2019-08-19 DIAGNOSIS — Z86010 Personal history of colonic polyps: Secondary | ICD-10-CM | POA: Diagnosis not present

## 2019-08-19 DIAGNOSIS — Z0001 Encounter for general adult medical examination with abnormal findings: Secondary | ICD-10-CM | POA: Diagnosis not present

## 2019-08-19 DIAGNOSIS — E782 Mixed hyperlipidemia: Secondary | ICD-10-CM | POA: Diagnosis not present

## 2019-08-19 DIAGNOSIS — D649 Anemia, unspecified: Secondary | ICD-10-CM | POA: Diagnosis not present

## 2019-08-19 DIAGNOSIS — N529 Male erectile dysfunction, unspecified: Secondary | ICD-10-CM | POA: Diagnosis not present

## 2019-08-19 NOTE — Progress Notes (Signed)
   Covid-19 Vaccination Clinic  Name:  Nathan Tyler    MRN: [id number] DOB: 06/06/1955  08/19/2019  [name] was observed post Covid-19 immunization for 15 minutes without incident. He was provided with Vaccine Information Sheet and instruction to access the V-Safe system.   [name] was instructed to call 911 with any severe reactions post vaccine: Marland Kitchen Difficulty breathing  . Swelling of face and throat  . A fast heartbeat  . A bad rash all over body  . Dizziness and weakness   Immunizations Administered    Name Date Dose VIS Date Route   Pfizer COVID-19 Vaccine 08/19/2019  2:40 PM 0.3 mL 06/17/2018 Intramuscular   Manufacturer: [place]   Lot: [id number]   [place]: [address]-8

## 2019-08-21 DIAGNOSIS — Z79899 Other long term (current) drug therapy: Secondary | ICD-10-CM | POA: Diagnosis not present

## 2019-09-01 NOTE — Unmapped (Signed)
[facility] Specialty Pharmacy Refill Coordination Note    Specialty Medication(s) to be Shipped:   Neurology: Gary Rogers    Other medication(s) to be shipped: n/a     Gary Rogers, DOB: Apr 30, 1955  Phone: [phone] (home)       All above HIPAA information was verified with patient.     Was a Nurse, learning disability used for this call? No    Completed refill call assessment today to schedule patient's medication shipment from the [facility] [phone]).       Specialty medication(s) and dose(s) confirmed: Regimen is correct and unchanged.   Changes to medications: Gary Rogers reports no changes at this time.  Changes to insurance: No  Questions for the pharmacist: No    Confirmed patient received Welcome Packet with first shipment. The patient will receive a drug information handout for each medication shipped and additional FDA Medication Guides as required.       DISEASE/MEDICATION-SPECIFIC INFORMATION        N/A    SPECIALTY MEDICATION ADHERENCE     Medication Adherence    Patient reported X missed doses in the last month: 0  Specialty Medication: Austedo  Patient is on additional specialty medications: No  Patient is on more than two specialty medications: No  Any gaps in refill history greater than 2 weeks in the last 3 months: no  Demonstrates understanding of importance of adherence: yes  Informant: patient              Austedo 6mg : Patient has 7 days of medication on hand      SHIPPING     Shipping address confirmed in Epic.     Delivery Scheduled: Yes, Expected medication delivery date: 5/13.     Medication will be delivered via UPS to the prescription address in Epic WAM.    Gary Rogers   [facility] Pharmacy Specialty Technician

## 2019-09-02 MED FILL — AUSTEDO 6 MG TABLET: ORAL | 30 days supply | Qty: 60 | Fill #1

## 2019-09-02 MED FILL — AUSTEDO 6 MG TABLET: 30 days supply | Qty: 60 | Fill #1 | Status: AC

## 2019-09-25 NOTE — Unmapped (Signed)
[facility] Specialty Pharmacy Refill Coordination Note    Specialty Medication(s) to be Shipped:   Neurology: [name]    Other medication(s) to be shipped: n/a     Gary Rogers, DOB: 1956-04-18  Phone: [phone] (home)       All above HIPAA information was verified with patient.     Was a Nurse, learning disability used for this call? No    Completed refill call assessment today to schedule patient's medication shipment from the [facility] [phone]).       Specialty medication(s) and dose(s) confirmed: Regimen is correct and unchanged.   Changes to medications: Gary Rogers reports no changes at this time.  Changes to insurance: No  Questions for the pharmacist: Yes: States they'd like to speak to pharmacist    Confirmed patient received Welcome Packet with first shipment. The patient will receive a drug information handout for each medication shipped and additional FDA Medication Guides as required.       DISEASE/MEDICATION-SPECIFIC INFORMATION        N/A    SPECIALTY MEDICATION ADHERENCE     Medication Adherence    Patient reported X missed doses in the last month: 1  Specialty Medication: Austedo 6mg   Patient is on additional specialty medications: No  Informant: patient                Austedo 6 mg: 16 days of medicine on hand         SHIPPING     Shipping address confirmed in Epic.     Delivery Scheduled: Yes, Expected medication delivery date: 10/08/19.     Medication will be delivered via UPS to the prescription address in Epic [place].    Wyatt Mage M Elisabeth Cara   [facility] Pharmacy Specialty Technician

## 2019-09-28 DIAGNOSIS — E039 Hypothyroidism, unspecified: Secondary | ICD-10-CM | POA: Diagnosis not present

## 2019-09-28 DIAGNOSIS — E875 Hyperkalemia: Secondary | ICD-10-CM | POA: Diagnosis not present

## 2019-09-28 DIAGNOSIS — E119 Type 2 diabetes mellitus without complications: Secondary | ICD-10-CM | POA: Diagnosis not present

## 2019-09-28 DIAGNOSIS — N1831 Chronic kidney disease, stage 3a: Secondary | ICD-10-CM | POA: Diagnosis not present

## 2019-09-28 DIAGNOSIS — G259 Extrapyramidal and movement disorder, unspecified: Secondary | ICD-10-CM | POA: Diagnosis not present

## 2019-09-28 DIAGNOSIS — E1122 Type 2 diabetes mellitus with diabetic chronic kidney disease: Secondary | ICD-10-CM | POA: Diagnosis not present

## 2019-09-28 DIAGNOSIS — E782 Mixed hyperlipidemia: Secondary | ICD-10-CM | POA: Diagnosis not present

## 2019-09-28 DIAGNOSIS — E1169 Type 2 diabetes mellitus with other specified complication: Secondary | ICD-10-CM | POA: Diagnosis not present

## 2019-09-28 DIAGNOSIS — B1710 Acute hepatitis C without hepatic coma: Secondary | ICD-10-CM | POA: Diagnosis not present

## 2019-09-28 DIAGNOSIS — E46 Unspecified protein-calorie malnutrition: Secondary | ICD-10-CM | POA: Diagnosis not present

## 2019-09-28 DIAGNOSIS — I10 Essential (primary) hypertension: Secondary | ICD-10-CM | POA: Diagnosis not present

## 2019-09-28 DIAGNOSIS — E1165 Type 2 diabetes mellitus with hyperglycemia: Secondary | ICD-10-CM | POA: Diagnosis not present

## 2019-09-28 DIAGNOSIS — D649 Anemia, unspecified: Secondary | ICD-10-CM | POA: Diagnosis not present

## 2019-09-28 DIAGNOSIS — B1920 Unspecified viral hepatitis C without hepatic coma: Secondary | ICD-10-CM | POA: Diagnosis not present

## 2019-09-28 DIAGNOSIS — I5032 Chronic diastolic (congestive) heart failure: Secondary | ICD-10-CM | POA: Diagnosis not present

## 2019-10-07 MED FILL — AUSTEDO 6 MG TABLET: ORAL | 30 days supply | Qty: 60 | Fill #2

## 2019-10-07 MED FILL — AUSTEDO 6 MG TABLET: 30 days supply | Qty: 60 | Fill #2 | Status: AC

## 2019-10-29 DIAGNOSIS — G2401 Drug induced subacute dyskinesia: Principal | ICD-10-CM

## 2019-10-29 DIAGNOSIS — G255 Other chorea: Principal | ICD-10-CM

## 2019-10-29 MED ORDER — AUSTEDO 6 MG TABLET
ORAL_TABLET | Freq: Two times a day (BID) | ORAL | 6 refills | 30.00000 days | Status: CP
Start: 2019-10-29 — End: ?
  Filled 2019-11-03: qty 60, 30d supply, fill #0

## 2019-10-29 NOTE — Unmapped (Signed)
[facility] Special[facility] Pharmacy Clinical Assessment & Refill Coordination Note    Gary Rogers, [place]: 10/01/1955  Phone: [phone] (home)     All above HIPAA information was verified with patient.     Was a Nurse, learning disability used for this call? No    Specialty Medication(s):   Neurology: Gary Rogers     Current Outpatient Medications   Medication Sig Dispense Refill   ??? atorvastatin (LIPITOR) 10 MG tablet Take 10 mg by mouth daily.     ??? deutetrabenazine (AUSTEDO) 6 mg Tab Take 1 tablet by mouth two (2) times a day. 60 tablet 2   ??? dulaglutide (TRULICITY) 1.5 mg/0.5 mL PnIj Inject 1.5 mg under the skin every seven (7) days.     ??? furosemide (LASIX) 20 MG tablet Take 20 mg by mouth daily.      ??? insulin degludec (TRESIBA FLEXTOUCH U-200) 200 unit/mL (3 mL) InPn Inject under the skin daily.      ??? levothyroxine (SYNTHROID, LEVOTHROID) 100 MCG tablet Take 100 mcg by mouth daily at 0600.     ??? lisinopril (PRINIVIL,ZESTRIL) 10 MG tablet Take 10 mg by mouth nightly.   3   ??? mycophenolate (CELLCEPT) 500 mg tablet Take by mouth Two (2) times a day.     ??? predniSONE (DELTASONE) 5 MG tablet Take 5 mg by mouth daily.     ??? tacrolimus (PROGRAF) 0.5 MG capsule Take 2 mg by mouth Two (2) times a day. TAKE 4 CAPSULES BY MOUTH TWICE DAILY       No current facility-administered medications for this visit.        Changes to medications: Gary Rogers reports no changes at this time.    No Known Allergies    Changes to allergies: No    SPECIALTY MEDICATION ADHERENCE     Austedo 6 mg: 13 days of medicine on hand       Medication Adherence    Patient reported X missed doses in the last month: 0  Specialty Medication: Austedo  Patient is on additional specialty medications: No  Informant: patient          Specialty medication(s) dose(s) confirmed: Regimen is correct and unchanged.     Are there any concerns with adherence? No    Adherence counseling provided? Not needed    CLINICAL MANAGEMENT AND INTERVENTION      Clinical Benefit Assessment:    Do you feel the medicine is effective or helping your condition? Yes    Clinical Benefit counseling provided? Not needed    Adverse Effects Assessment:    Are you experiencing any side effects? No    Are you experiencing difficulty administering your medicine? No    Quality of Life Assessment:    How many days over the past month did your chorea  keep you from your normal activities? For example, brushing your teeth or getting up in the morning. 0    Have you discussed this with your provider? Not needed    Therapy Appropriateness:    Is therapy appropriate? Yes, therapy is appropriate and should be continued    DISEASE/MEDICATION-SPECIFIC INFORMATION      N/A    PATIENT SPECIFIC NEEDS     - Does the patient have any physical, cognitive, or cultural barriers? No    - Is the patient high risk? No     - Does the patient require a Care Management Plan? No     - Does the patient require physician intervention  or other additional services (i.e. nutrition, smoking cessation, social work)? No      SHIPPING     Specialty Medication(s) to be Shipped:   Neurology: Gary Rogers    Other medication(s) to be shipped: none     Changes to insurance: No    Delivery Scheduled: Yes, Expected medication delivery date: 11/04/19.  However, Rx request for refills was sent to the provider as there are none remaining.     Medication will be delivered via UPS to the confirmed prescription address in [facility].    The patient will receive a drug information handout for each medication shipped and additional FDA Medication Guides as required.  Verified that patient has previously received a Conservation officer, historic buildings.    All of the patient's questions and concerns have been addressed.    Gary Rogers   [facility] Pharmacy Specialty Pharmacist

## 2019-10-29 NOTE — Unmapped (Signed)
Last Visit Date: 02/23/2019  Next Visit Date: No appt was sched. Recall is in.     No results found for: CBC, CMP     Results for orders placed or performed in visit on 04/07/18   ECG 12 lead   Result Value Ref Range    EKG Systolic BP  mmHg    EKG Diastolic BP  mmHg    EKG Ventricular Rate 71 BPM    EKG Atrial Rate 71 BPM    EKG P-R Interval 144 ms    EKG QRS Duration 88 ms    EKG Q-T Interval 366 ms    EKG QTC Calculation 397 ms    EKG Calculated P Axis 64 degrees    EKG Calculated R Axis 21 degrees    EKG Calculated T Axis 67 degrees    QTC Fredericia 387 ms

## 2019-11-03 MED FILL — AUSTEDO 6 MG TABLET: 30 days supply | Qty: 60 | Fill #0 | Status: AC

## 2019-12-01 NOTE — Unmapped (Signed)
[facility] Pharmacy Refill Coordination Note    Specialty Medication(s) to be Shipped:   Neurology: Gary Rogers    Other medication(s) to be shipped: No additional medications requested for fill at this time     Gary Rogers, DOB: 1956/03/18  Phone: [phone] (home)       All above HIPAA information was verified with patient.     Was a Nurse, learning disability used for this call? No    Completed refill call assessment today to schedule patient's medication shipment from the [facility] [phone]).       Specialty medication(s) and dose(s) confirmed: Regimen is correct and unchanged.   Changes to medications: Ollivander reports no changes at this time.  Changes to insurance: No  Questions for the pharmacist: No    Confirmed patient received Welcome Packet with first shipment. The patient will receive a drug information handout for each medication shipped and additional FDA Medication Guides as required.       DISEASE/MEDICATION-SPECIFIC INFORMATION        N/A    SPECIALTY MEDICATION ADHERENCE     Medication Adherence    Patient reported X missed doses in the last month: 1  Specialty Medication: Austedo  Patient is on additional specialty medications: No  Patient is on more than two specialty medications: No  Any gaps in refill history greater than 2 weeks in the last 3 months: no  Demonstrates understanding of importance of adherence: yes  Informant: patient              Austedo 6mg : Patient has 12 days of medication on hand       SHIPPING     Shipping address confirmed in Epic.     Delivery Scheduled: Yes, Expected medication delivery date: 8/17.     Medication will be delivered via UPS to the prescription address in Epic WAM.    [name]   [facility] Specialty Technician

## 2019-12-07 MED FILL — AUSTEDO 6 MG TABLET: ORAL | 30 days supply | Qty: 60 | Fill #1

## 2019-12-07 MED FILL — AUSTEDO 6 MG TABLET: 30 days supply | Qty: 60 | Fill #1 | Status: AC

## 2019-12-08 DIAGNOSIS — N189 Chronic kidney disease, unspecified: Secondary | ICD-10-CM | POA: Diagnosis not present

## 2019-12-08 DIAGNOSIS — N183 Chronic kidney disease, stage 3 unspecified: Secondary | ICD-10-CM | POA: Diagnosis not present

## 2019-12-08 DIAGNOSIS — Z79899 Other long term (current) drug therapy: Secondary | ICD-10-CM | POA: Diagnosis not present

## 2019-12-10 DIAGNOSIS — J9601 Acute respiratory failure with hypoxia: Secondary | ICD-10-CM | POA: Diagnosis not present

## 2019-12-10 DIAGNOSIS — U071 COVID-19: Secondary | ICD-10-CM | POA: Diagnosis not present

## 2019-12-10 DIAGNOSIS — D649 Anemia, unspecified: Secondary | ICD-10-CM | POA: Diagnosis not present

## 2019-12-10 DIAGNOSIS — E46 Unspecified protein-calorie malnutrition: Secondary | ICD-10-CM | POA: Diagnosis not present

## 2019-12-10 DIAGNOSIS — I5033 Acute on chronic diastolic (congestive) heart failure: Secondary | ICD-10-CM | POA: Diagnosis not present

## 2019-12-10 DIAGNOSIS — J9312 Secondary spontaneous pneumothorax: Secondary | ICD-10-CM | POA: Diagnosis not present

## 2019-12-10 DIAGNOSIS — J1282 Pneumonia due to coronavirus disease 2019: Secondary | ICD-10-CM | POA: Diagnosis not present

## 2019-12-10 DIAGNOSIS — Z8616 Personal history of COVID-19: Secondary | ICD-10-CM | POA: Diagnosis not present

## 2019-12-10 DIAGNOSIS — I5032 Chronic diastolic (congestive) heart failure: Secondary | ICD-10-CM | POA: Diagnosis not present

## 2019-12-10 DIAGNOSIS — E875 Hyperkalemia: Secondary | ICD-10-CM | POA: Diagnosis not present

## 2019-12-10 DIAGNOSIS — N529 Male erectile dysfunction, unspecified: Secondary | ICD-10-CM | POA: Diagnosis not present

## 2019-12-10 DIAGNOSIS — Z0189 Encounter for other specified special examinations: Secondary | ICD-10-CM | POA: Diagnosis not present

## 2019-12-17 DIAGNOSIS — E1169 Type 2 diabetes mellitus with other specified complication: Secondary | ICD-10-CM | POA: Diagnosis not present

## 2019-12-17 DIAGNOSIS — N529 Male erectile dysfunction, unspecified: Secondary | ICD-10-CM | POA: Diagnosis not present

## 2019-12-17 DIAGNOSIS — Z8619 Personal history of other infectious and parasitic diseases: Secondary | ICD-10-CM | POA: Diagnosis not present

## 2019-12-17 DIAGNOSIS — Z86010 Personal history of colonic polyps: Secondary | ICD-10-CM | POA: Diagnosis not present

## 2019-12-17 DIAGNOSIS — D649 Anemia, unspecified: Secondary | ICD-10-CM | POA: Diagnosis not present

## 2019-12-17 DIAGNOSIS — Z0001 Encounter for general adult medical examination with abnormal findings: Secondary | ICD-10-CM | POA: Diagnosis not present

## 2019-12-17 DIAGNOSIS — N1831 Chronic kidney disease, stage 3a: Secondary | ICD-10-CM | POA: Diagnosis not present

## 2019-12-17 DIAGNOSIS — E46 Unspecified protein-calorie malnutrition: Secondary | ICD-10-CM | POA: Diagnosis not present

## 2019-12-17 DIAGNOSIS — I5032 Chronic diastolic (congestive) heart failure: Secondary | ICD-10-CM | POA: Diagnosis not present

## 2019-12-17 DIAGNOSIS — E039 Hypothyroidism, unspecified: Secondary | ICD-10-CM | POA: Diagnosis not present

## 2019-12-17 DIAGNOSIS — E782 Mixed hyperlipidemia: Secondary | ICD-10-CM | POA: Diagnosis not present

## 2019-12-17 DIAGNOSIS — Z7409 Other reduced mobility: Secondary | ICD-10-CM | POA: Diagnosis not present

## 2019-12-17 DIAGNOSIS — E875 Hyperkalemia: Secondary | ICD-10-CM | POA: Diagnosis not present

## 2019-12-25 DIAGNOSIS — N183 Chronic kidney disease, stage 3 unspecified: Secondary | ICD-10-CM | POA: Diagnosis not present

## 2020-01-05 NOTE — Unmapped (Signed)
[facility] Pharmacy Refill Coordination Note    Specialty Medication(s) to be Shipped:   Neurology: [name]    Other medication(s) to be shipped: No additional medications requested for fill at this time     Gary Rogers, DOB: 11/07/1955  Phone: [phone] (home)       All above HIPAA information was verified with patient.     Was a Nurse, learning disability used for this call? No    Completed refill call assessment today to schedule patient's medication shipment from the [facility] [phone]).       Specialty medication(s) and dose(s) confirmed: Regimen is correct and unchanged.   Changes to medications: Oracio reports no changes at this time.  Changes to insurance: No  Questions for the pharmacist: No    Confirmed patient received Welcome Packet with first shipment. The patient will receive a drug information handout for each medication shipped and additional FDA Medication Guides as required.       DISEASE/MEDICATION-SPECIFIC INFORMATION        N/A    SPECIALTY MEDICATION ADHERENCE     Medication Adherence    Patient reported X missed doses in the last month: 0  Specialty Medication: austedo 6mg                 austedo 6mg   : 8 days of medicine on hand         SHIPPING     Shipping address confirmed in Epic.     Delivery Scheduled: Yes, Expected medication delivery date: 9/17.     Medication will be delivered via UPS to the prescription address in Epic WAM.    Gary Rogers   [facility] Specialty Technician

## 2020-01-07 DIAGNOSIS — Z79899 Other long term (current) drug therapy: Secondary | ICD-10-CM | POA: Diagnosis not present

## 2020-01-07 DIAGNOSIS — I10 Essential (primary) hypertension: Secondary | ICD-10-CM | POA: Diagnosis not present

## 2020-01-07 DIAGNOSIS — Z794 Long term (current) use of insulin: Secondary | ICD-10-CM | POA: Diagnosis not present

## 2020-01-07 DIAGNOSIS — Z940 Kidney transplant status: Secondary | ICD-10-CM | POA: Diagnosis not present

## 2020-01-07 DIAGNOSIS — E1121 Type 2 diabetes mellitus with diabetic nephropathy: Secondary | ICD-10-CM | POA: Diagnosis not present

## 2020-01-07 DIAGNOSIS — Z4822 Encounter for aftercare following kidney transplant: Secondary | ICD-10-CM | POA: Diagnosis not present

## 2020-01-07 DIAGNOSIS — Z23 Encounter for immunization: Secondary | ICD-10-CM | POA: Diagnosis not present

## 2020-01-07 MED FILL — AUSTEDO 6 MG TABLET: 30 days supply | Qty: 60 | Fill #2 | Status: AC

## 2020-01-07 MED FILL — AUSTEDO 6 MG TABLET: ORAL | 30 days supply | Qty: 60 | Fill #2

## 2020-01-13 DIAGNOSIS — N183 Chronic kidney disease, stage 3 unspecified: Secondary | ICD-10-CM | POA: Diagnosis not present

## 2020-01-13 DIAGNOSIS — Z940 Kidney transplant status: Secondary | ICD-10-CM | POA: Diagnosis not present

## 2020-01-22 DIAGNOSIS — B351 Tinea unguium: Secondary | ICD-10-CM | POA: Diagnosis not present

## 2020-01-22 DIAGNOSIS — E1142 Type 2 diabetes mellitus with diabetic polyneuropathy: Secondary | ICD-10-CM | POA: Diagnosis not present

## 2020-01-22 DIAGNOSIS — L851 Acquired keratosis [keratoderma] palmaris et plantaris: Secondary | ICD-10-CM | POA: Diagnosis not present

## 2020-01-28 DIAGNOSIS — E113293 Type 2 diabetes mellitus with mild nonproliferative diabetic retinopathy without macular edema, bilateral: Secondary | ICD-10-CM | POA: Diagnosis not present

## 2020-01-28 DIAGNOSIS — E875 Hyperkalemia: Secondary | ICD-10-CM | POA: Diagnosis not present

## 2020-01-28 DIAGNOSIS — E119 Type 2 diabetes mellitus without complications: Secondary | ICD-10-CM | POA: Diagnosis not present

## 2020-02-08 NOTE — Unmapped (Signed)
[facility] Pharmacy Refill Coordination Note    Specialty Medication(s) to be Shipped:   Neurology: Gary Rogers    Other medication(s) to be shipped: No additional medications requested for fill at this time     Gary Rogers, DOB: Jan 06, 1956  Phone: [phone] (home)       All above HIPAA information was verified with patient.     Was a Nurse, learning disability used for this call? No    Completed refill call assessment today to schedule patient's medication shipment from the [facility] [phone]).       Specialty medication(s) and dose(s) confirmed: Regimen is correct and unchanged.   Changes to medications: Gary Rogers reports no changes at this time.  Changes to insurance: No  Questions for the pharmacist: No    Confirmed patient received Welcome Packet with first shipment. The patient will receive a drug information handout for each medication shipped and additional FDA Medication Guides as required.       DISEASE/MEDICATION-SPECIFIC INFORMATION        N/A    SPECIALTY MEDICATION ADHERENCE     Medication Adherence    Patient reported X missed doses in the last month: 0  Specialty Medication: Austedo 6mg   Patient is on additional specialty medications: No        Austedo 6 mg: 7 days of medicine on hand     SHIPPING     Shipping address confirmed in Epic.     Delivery Scheduled: Yes, Expected medication delivery date: 02/11/2020.     Medication will be delivered via UPS to the prescription address in Epic WAM.    Gary Rogers [facility] Specialty Technician

## 2020-02-10 MED FILL — AUSTEDO 6 MG TABLET: 30 days supply | Qty: 60 | Fill #3 | Status: AC

## 2020-02-10 MED FILL — AUSTEDO 6 MG TABLET: ORAL | 30 days supply | Qty: 60 | Fill #3

## 2020-02-26 DIAGNOSIS — B351 Tinea unguium: Secondary | ICD-10-CM | POA: Diagnosis not present

## 2020-02-26 DIAGNOSIS — E1142 Type 2 diabetes mellitus with diabetic polyneuropathy: Secondary | ICD-10-CM | POA: Diagnosis not present

## 2020-02-26 DIAGNOSIS — L851 Acquired keratosis [keratoderma] palmaris et plantaris: Secondary | ICD-10-CM | POA: Diagnosis not present

## 2020-03-04 NOTE — Unmapped (Signed)
[facility] Specialty Pharmacy Refill Coordination Note    Specialty Medication(s) to be Shipped:   Neurology: [name]    Other medication(s) to be shipped: No additional medications requested for fill at this time     Gary Rogers, DOB: March 01, 1956  Phone: [phone] (home)       All above HIPAA information was verified with patient.     Was a Nurse, learning disability used for this call? No    Completed refill call assessment today to schedule patient's medication shipment from the [facility] [phone]).       Specialty medication(s) and dose(s) confirmed: Regimen is correct and unchanged.   Changes to medications: Gary Rogers reports no changes at this time.  Changes to insurance: No  Questions for the pharmacist: No    Confirmed patient received Welcome Packet with first shipment. The patient will receive a drug information handout for each medication shipped and additional FDA Medication Guides as required.       DISEASE/MEDICATION-SPECIFIC INFORMATION        N/A    SPECIALTY MEDICATION ADHERENCE     Medication Adherence    Patient reported X missed doses in the last month: 0  Specialty Medication: Austedo  Patient is on additional specialty medications: No  Patient is on more than two specialty medications: No  Any gaps in refill history greater than 2 weeks in the last 3 months: no  Demonstrates understanding of importance of adherence: yes  Informant: patient                Austedo 6mg : Patient has 14 days of medication on hand      SHIPPING     Shipping address confirmed in Epic.     Delivery Scheduled: Yes, Expected medication delivery date: 11/23.     Medication will be delivered via UPS to the prescription address in Epic WAM.    [name]   [facility] Specialty Technician

## 2020-03-14 MED FILL — AUSTEDO 6 MG TABLET: 30 days supply | Qty: 60 | Fill #4 | Status: AC

## 2020-03-14 MED FILL — AUSTEDO 6 MG TABLET: ORAL | 30 days supply | Qty: 60 | Fill #4

## 2020-03-24 DIAGNOSIS — D631 Anemia in chronic kidney disease: Secondary | ICD-10-CM | POA: Diagnosis not present

## 2020-03-24 DIAGNOSIS — I5032 Chronic diastolic (congestive) heart failure: Secondary | ICD-10-CM | POA: Diagnosis not present

## 2020-03-24 DIAGNOSIS — J1282 Pneumonia due to coronavirus disease 2019: Secondary | ICD-10-CM | POA: Diagnosis not present

## 2020-03-24 DIAGNOSIS — J9312 Secondary spontaneous pneumothorax: Secondary | ICD-10-CM | POA: Diagnosis not present

## 2020-03-24 DIAGNOSIS — N529 Male erectile dysfunction, unspecified: Secondary | ICD-10-CM | POA: Diagnosis not present

## 2020-03-24 DIAGNOSIS — Z0189 Encounter for other specified special examinations: Secondary | ICD-10-CM | POA: Diagnosis not present

## 2020-03-24 DIAGNOSIS — J9601 Acute respiratory failure with hypoxia: Secondary | ICD-10-CM | POA: Diagnosis not present

## 2020-03-24 DIAGNOSIS — N183 Chronic kidney disease, stage 3 unspecified: Secondary | ICD-10-CM | POA: Diagnosis not present

## 2020-03-24 DIAGNOSIS — D649 Anemia, unspecified: Secondary | ICD-10-CM | POA: Diagnosis not present

## 2020-03-24 DIAGNOSIS — U071 COVID-19: Secondary | ICD-10-CM | POA: Diagnosis not present

## 2020-03-24 DIAGNOSIS — E46 Unspecified protein-calorie malnutrition: Secondary | ICD-10-CM | POA: Diagnosis not present

## 2020-03-24 DIAGNOSIS — I5033 Acute on chronic diastolic (congestive) heart failure: Secondary | ICD-10-CM | POA: Diagnosis not present

## 2020-03-24 DIAGNOSIS — Z8616 Personal history of COVID-19: Secondary | ICD-10-CM | POA: Diagnosis not present

## 2020-03-24 DIAGNOSIS — E875 Hyperkalemia: Secondary | ICD-10-CM | POA: Diagnosis not present

## 2020-03-31 DIAGNOSIS — E782 Mixed hyperlipidemia: Secondary | ICD-10-CM | POA: Diagnosis not present

## 2020-03-31 DIAGNOSIS — Z86010 Personal history of colonic polyps: Secondary | ICD-10-CM | POA: Diagnosis not present

## 2020-03-31 DIAGNOSIS — Z8619 Personal history of other infectious and parasitic diseases: Secondary | ICD-10-CM | POA: Diagnosis not present

## 2020-03-31 DIAGNOSIS — I5032 Chronic diastolic (congestive) heart failure: Secondary | ICD-10-CM | POA: Diagnosis not present

## 2020-03-31 DIAGNOSIS — E875 Hyperkalemia: Secondary | ICD-10-CM | POA: Diagnosis not present

## 2020-03-31 DIAGNOSIS — D649 Anemia, unspecified: Secondary | ICD-10-CM | POA: Diagnosis not present

## 2020-03-31 DIAGNOSIS — Z7409 Other reduced mobility: Secondary | ICD-10-CM | POA: Diagnosis not present

## 2020-03-31 DIAGNOSIS — E1169 Type 2 diabetes mellitus with other specified complication: Secondary | ICD-10-CM | POA: Diagnosis not present

## 2020-03-31 DIAGNOSIS — E039 Hypothyroidism, unspecified: Secondary | ICD-10-CM | POA: Diagnosis not present

## 2020-03-31 DIAGNOSIS — E46 Unspecified protein-calorie malnutrition: Secondary | ICD-10-CM | POA: Diagnosis not present

## 2020-03-31 DIAGNOSIS — N1831 Chronic kidney disease, stage 3a: Secondary | ICD-10-CM | POA: Diagnosis not present

## 2020-03-31 DIAGNOSIS — N529 Male erectile dysfunction, unspecified: Secondary | ICD-10-CM | POA: Diagnosis not present

## 2020-04-06 NOTE — Unmapped (Signed)
[facility] Pharmacy Clinical Assessment & Refill Coordination Note    Gary Rogers, [place]: [birth year]  Phone: [phone] (home)     All above HIPAA information was verified with patient.     Was a Nurse, learning disability used for this call? No    Specialty Medication(s):   Neurology: Gary Rogers     Current Outpatient Medications   Medication Sig Dispense Refill   ??? atorvastatin (LIPITOR) 10 MG tablet Take 10 mg by mouth daily.     ??? deutetrabenazine (AUSTEDO) 6 mg Tab Take 1 tablet by mouth two (2) times a day. 60 tablet 6   ??? dulaglutide (TRULICITY) 1.5 mg/0.5 mL PnIj Inject 1.5 mg under the skin every seven (7) days.     ??? furosemide (LASIX) 20 MG tablet Take 20 mg by mouth daily.      ??? insulin degludec (TRESIBA FLEXTOUCH U-200) 200 unit/mL (3 mL) InPn Inject under the skin daily.      ??? levothyroxine (SYNTHROID, LEVOTHROID) 100 MCG tablet Take 100 mcg by mouth daily at 0600.     ??? lisinopril (PRINIVIL,ZESTRIL) 10 MG tablet Take 10 mg by mouth nightly.   3   ??? mycophenolate (CELLCEPT) 500 mg tablet Take by mouth Two (2) times a day.     ??? predniSONE (DELTASONE) 5 MG tablet Take 5 mg by mouth daily.     ??? tacrolimus (PROGRAF) 0.5 MG capsule Take 2 mg by mouth Two (2) times a day. TAKE 4 CAPSULES BY MOUTH TWICE DAILY       No current facility-administered medications for this visit.        Changes to medications: Gary Rogers reports starting the following medications: Potassium supplement    No Known Allergies    Changes to allergies: No    SPECIALTY MEDICATION ADHERENCE     Austedo 6 mg: 12 days of medicine on hand       Medication Adherence    Patient reported X missed doses in the last month: 0  Specialty Medication: Austedo 6 mg  Patient is on additional specialty medications: No  Informant: patient  Confirmed plan for next specialty medication refill: delivery by pharmacy  Refills needed for supportive medications: not needed          Specialty medication(s) dose(s) confirmed: Regimen is correct and unchanged.     Are there any concerns with adherence? No    Adherence counseling provided? Not needed    CLINICAL MANAGEMENT AND INTERVENTION      Clinical Benefit Assessment:    Do you feel the medicine is effective or helping your condition? Yes    Clinical Benefit counseling provided? Not needed    Adverse Effects Assessment:    Are you experiencing any side effects? No    Are you experiencing difficulty administering your medicine? No    Quality of Life Assessment:    How many days over the past month did your Chorea  keep you from your normal activities? For example, brushing your teeth or getting up in the morning. 0    Have you discussed this with your provider? Not needed    Therapy Appropriateness:    Is therapy appropriate? Yes, therapy is appropriate and should be continued    DISEASE/MEDICATION-SPECIFIC INFORMATION      N/A    PATIENT SPECIFIC NEEDS     - Does the patient have any physical, cognitive, or cultural barriers? No    - Is the patient high risk? No    -  Does the patient require a Care Management Plan? No     - Does the patient require physician intervention or other additional services (i.e. nutrition, smoking cessation, social work)? No      SHIPPING     Specialty Medication(s) to be Shipped:   Neurology: Gary Rogers    Other medication(s) to be shipped: No additional medications requested for fill at this time     Changes to insurance: No    Delivery Scheduled: Yes, Expected medication delivery date: 04/12/20.     Medication will be delivered via UPS to the confirmed prescription address in [facility].    The patient will receive a drug information handout for each medication shipped and additional FDA Medication Guides as required.  Verified that patient has previously received a Conservation officer, historic buildings.    All of the patient's questions and concerns have been addressed.    Gary Rogers Gary Rogers   [facility] Specialty Pharmacist

## 2020-04-07 DIAGNOSIS — Z961 Presence of intraocular lens: Secondary | ICD-10-CM | POA: Diagnosis not present

## 2020-04-07 DIAGNOSIS — C6901 Malignant neoplasm of right conjunctiva: Secondary | ICD-10-CM | POA: Diagnosis not present

## 2020-04-09 DIAGNOSIS — G255 Other chorea: Principal | ICD-10-CM

## 2020-04-09 DIAGNOSIS — G2401 Drug induced subacute dyskinesia: Principal | ICD-10-CM

## 2020-04-11 NOTE — Unmapped (Signed)
Gary Rogers 's Austedo shipment will be delayed as a result of a high copay.     I have reached out to the patient and communicated the delay. We will call the patient back to reschedule the delivery upon resolution. We have not confirmed the new delivery date.

## 2020-04-12 DIAGNOSIS — H17821 Peripheral opacity of cornea, right eye: Secondary | ICD-10-CM | POA: Diagnosis not present

## 2020-04-12 DIAGNOSIS — D4989 Neoplasm of unspecified behavior of other specified sites: Secondary | ICD-10-CM | POA: Diagnosis not present

## 2020-04-28 MED FILL — AUSTEDO 6 MG TABLET: ORAL | 30 days supply | Qty: 60 | Fill #5

## 2020-04-28 MED FILL — AUSTEDO 6 MG TABLET: 30 days supply | Qty: 60 | Fill #5 | Status: AC

## 2020-04-28 NOTE — Unmapped (Signed)
Received VM from patient re: Austedo. Called patient back, he is all out of medication. Informed him no grants currently open w/funding. Advised pt I have left 2 VM for him last month regarding touching base w/Mfr when grant expired. Pt stated he can not afford without assistance, provided pt with [employer] phone # for free drug assistance, unable to apply on pt behalf. Will keep looking for funding for pt daily.      Of note, patient wanted provider to know he has been diagnosed with right eye cancer.    Missael Ferrari, CPhT

## 2020-04-28 NOTE — Unmapped (Signed)
Gary Rogers 's Austedo shipment will be sent out  as a result of financial assistance is now approved.     I have reached out to the patient in addition to the clinic and communicated the delivery change. We will reschedule the medication for the delivery date that the patient agreed upon.  We have confirmed the delivery date as 04/29/20, via ups.

## 2020-05-03 DIAGNOSIS — D4989 Neoplasm of unspecified behavior of other specified sites: Secondary | ICD-10-CM | POA: Diagnosis not present

## 2020-05-20 NOTE — Unmapped (Signed)
[facility] Special[facility] Pharmacy Refill Coordination Note    Specialty Medication(s) to be Shipped:   Neurology: [name]    Other medication(s) to be shipped: No additional medications requested for fill at this time     Georg Ang, DOB: Feb 28, 1956  Phone: [phone] (home)       All above HIPAA information was verified with patient.     Was a Nurse, learning disability used for this call? No    Completed refill call assessment today to schedule patient's medication shipment from the [facility] [phone]).       Specialty medication(s) and dose(s) confirmed: Regimen is correct and unchanged.   Changes to medications: Kaushik reports no changes at this time.  Changes to insurance: No  Questions for the pharmacist: No    Confirmed patient received Welcome Packet with first shipment. The patient will receive a drug information handout for each medication shipped and additional FDA Medication Guides as required.       DISEASE/MEDICATION-SPECIFIC INFORMATION        N/A    SPECIALTY MEDICATION ADHERENCE     Medication Adherence    Patient reported X missed doses in the last month: 0  Specialty Medication: Austedo  Patient is on additional specialty medications: No  Patient is on more than two specialty medications: No  Any gaps in refill history greater than 2 weeks in the last 3 months: no  Demonstrates understanding of importance of adherence: yes  Informant: patient                Austedo 6mg : Patient has 10 days of medication on hand      SHIPPING     Shipping address confirmed in Epic.     Delivery Scheduled: Yes, Expected medication delivery date: 2/4.     Medication will be delivered via UPS to the prescription address in Epic WAM.    [name]   [facility] Specialty Technician

## 2020-05-26 MED FILL — AUSTEDO 6 MG TABLET: ORAL | 30 days supply | Qty: 60 | Fill #6

## 2020-06-14 DIAGNOSIS — L851 Acquired keratosis [keratoderma] palmaris et plantaris: Secondary | ICD-10-CM | POA: Diagnosis not present

## 2020-06-14 DIAGNOSIS — E1142 Type 2 diabetes mellitus with diabetic polyneuropathy: Secondary | ICD-10-CM | POA: Diagnosis not present

## 2020-06-14 DIAGNOSIS — B351 Tinea unguium: Secondary | ICD-10-CM | POA: Diagnosis not present

## 2020-06-21 DIAGNOSIS — G2401 Drug induced subacute dyskinesia: Principal | ICD-10-CM

## 2020-06-21 DIAGNOSIS — G255 Other chorea: Principal | ICD-10-CM

## 2020-06-21 NOTE — Unmapped (Signed)
Request received via interface.     Provider: Dr. Raenette Rover    Last Visit Date: 02/23/2019    Next Visit Date: Visit date not found    No documented EKG on file.    No results found for: CBC, CMP     Results for orders placed or performed in visit on 04/07/18   ECG 12 lead   Result Value Ref Range    EKG Systolic BP  mmHg    EKG Diastolic BP  mmHg    EKG Ventricular Rate 71 BPM    EKG Atrial Rate 71 BPM    EKG P-R Interval 144 ms    EKG QRS Duration 88 ms    EKG Q-T Interval 366 ms    EKG QTC Calculation 397 ms    EKG Calculated P Axis 64 degrees    EKG Calculated R Axis 21 degrees    EKG Calculated T Axis 67 degrees    QTC Fredericia 387 ms

## 2020-06-21 NOTE — Unmapped (Signed)
[facility] Special[facility] Pharmacy Refill Coordination Note    Specialty Medication(s) to be Shipped:   Neurology: Gary Rogers    Other medication(s) to be shipped: No additional medications requested for fill at this time     Gary Rogers, DOB: Jan 03, 1956  Phone: [phone] (home)       All above HIPAA information was verified with patient.     Was a Nurse, learning disability used for this call? No    Completed refill call assessment today to schedule patient's medication shipment from the [facility] [phone]).       Specialty medication(s) and dose(s) confirmed: Regimen is correct and unchanged.   Changes to medications: [name] reports no changes at this time.  Changes to insurance: No  Questions for the pharmacist: No    Confirmed patient received Welcome Packet with first shipment. The patient will receive a drug information handout for each medication shipped and additional FDA Medication Guides as required.       DISEASE/MEDICATION-SPECIFIC INFORMATION        N/A    SPECIALTY MEDICATION ADHERENCE     Medication Adherence    Patient reported X missed doses in the last month: 0  Specialty Medication: Austedo 6mg   Patient is on additional specialty medications: No                Austedo 6 mg: 10 days of medicine on hand          SHIPPING     Shipping address confirmed in Epic.     Delivery Scheduled: Yes, Expected medication delivery date: 06/24/20.  However, Rx request for refills was sent to the provider as there are none remaining.     Medication will be delivered via UPS to the prescription address in Epic WAM.    Gary Rogers   [facility] Specialty Technician

## 2020-06-22 DIAGNOSIS — D4989 Neoplasm of unspecified behavior of other specified sites: Secondary | ICD-10-CM | POA: Diagnosis not present

## 2020-06-22 MED ORDER — AUSTEDO 6 MG TABLET
ORAL_TABLET | Freq: Two times a day (BID) | ORAL | 6 refills | 30 days | Status: CP
Start: 2020-06-22 — End: ?
  Filled 2020-06-23: qty 60, 30d supply, fill #0

## 2020-06-24 DIAGNOSIS — D509 Iron deficiency anemia, unspecified: Secondary | ICD-10-CM | POA: Diagnosis not present

## 2020-06-24 DIAGNOSIS — N183 Chronic kidney disease, stage 3 unspecified: Secondary | ICD-10-CM | POA: Diagnosis not present

## 2020-06-30 NOTE — Unmapped (Signed)
LVM to schedule f/u with Dr. Raenette Rover, okay to use a May urgent slot per Deanna's 3/10 inbasket

## 2020-07-07 DIAGNOSIS — E782 Mixed hyperlipidemia: Secondary | ICD-10-CM | POA: Diagnosis not present

## 2020-07-07 DIAGNOSIS — E875 Hyperkalemia: Secondary | ICD-10-CM | POA: Diagnosis not present

## 2020-07-07 DIAGNOSIS — Z6828 Body mass index (BMI) 28.0-28.9, adult: Secondary | ICD-10-CM | POA: Diagnosis not present

## 2020-07-07 DIAGNOSIS — D649 Anemia, unspecified: Secondary | ICD-10-CM | POA: Diagnosis not present

## 2020-07-07 DIAGNOSIS — E1122 Type 2 diabetes mellitus with diabetic chronic kidney disease: Secondary | ICD-10-CM | POA: Diagnosis not present

## 2020-07-07 DIAGNOSIS — I10 Essential (primary) hypertension: Secondary | ICD-10-CM | POA: Diagnosis not present

## 2020-07-07 DIAGNOSIS — E039 Hypothyroidism, unspecified: Secondary | ICD-10-CM | POA: Diagnosis not present

## 2020-07-07 DIAGNOSIS — Z0189 Encounter for other specified special examinations: Secondary | ICD-10-CM | POA: Diagnosis not present

## 2020-07-14 DIAGNOSIS — N529 Male erectile dysfunction, unspecified: Secondary | ICD-10-CM | POA: Diagnosis not present

## 2020-07-14 DIAGNOSIS — E46 Unspecified protein-calorie malnutrition: Secondary | ICD-10-CM | POA: Diagnosis not present

## 2020-07-14 DIAGNOSIS — E1169 Type 2 diabetes mellitus with other specified complication: Secondary | ICD-10-CM | POA: Diagnosis not present

## 2020-07-14 DIAGNOSIS — E039 Hypothyroidism, unspecified: Secondary | ICD-10-CM | POA: Diagnosis not present

## 2020-07-14 DIAGNOSIS — Z86010 Personal history of colonic polyps: Secondary | ICD-10-CM | POA: Diagnosis not present

## 2020-07-14 DIAGNOSIS — E782 Mixed hyperlipidemia: Secondary | ICD-10-CM | POA: Diagnosis not present

## 2020-07-14 DIAGNOSIS — D649 Anemia, unspecified: Secondary | ICD-10-CM | POA: Diagnosis not present

## 2020-07-14 DIAGNOSIS — N1831 Chronic kidney disease, stage 3a: Secondary | ICD-10-CM | POA: Diagnosis not present

## 2020-07-14 DIAGNOSIS — Z7409 Other reduced mobility: Secondary | ICD-10-CM | POA: Diagnosis not present

## 2020-07-14 DIAGNOSIS — E875 Hyperkalemia: Secondary | ICD-10-CM | POA: Diagnosis not present

## 2020-07-14 DIAGNOSIS — Z8619 Personal history of other infectious and parasitic diseases: Secondary | ICD-10-CM | POA: Diagnosis not present

## 2020-07-14 DIAGNOSIS — I5032 Chronic diastolic (congestive) heart failure: Secondary | ICD-10-CM | POA: Diagnosis not present

## 2020-07-14 NOTE — Unmapped (Signed)
[facility] Pharm[facility] Refill Coordination Note    Specialty Medication(s) to be Shipped:   Neurology: [name]    Other medication(s) to be shipped: No additional medications requested for fill at this time     Gary Rogers, DOB: 1956/03/30  Phone: [phone] (home)       All above HIPAA information was verified with patient.     Was a Nurse, learning disability used for this call? No    Completed refill call assessment today to schedule patient's medication shipment from the [facility] [phone]).       Specialty medication(s) and dose(s) confirmed: Regimen is correct and unchanged.   Changes to medications: [name] reports no changes at this time.  Changes to insurance: No  Questions for the pharmacist: No    Confirmed patient received a Conservation officer, historic buildings and a Surveyor, mining with first shipment. The patient will receive a drug information handout for each medication shipped and additional FDA Medication Guides as required.       DISEASE/MEDICATION-SPECIFIC INFORMATION        N/A    SPECIALTY MEDICATION ADHERENCE     Medication Adherence    Patient reported X missed doses in the last month: 0  Specialty Medication: Austedo  Patient is on additional specialty medications: No  Patient is on more than two specialty medications: No  Any gaps in refill history greater than 2 weeks in the last 3 months: no  Demonstrates understanding of importance of adherence: yes  Informant: patient                Austedo 6mg : Patient has 14 days of medication on hand       SHIPPING     Shipping address confirmed in Epic.     Delivery Scheduled: Yes, Expected medication delivery date: 4/6.     Medication will be delivered via UPS to the prescription address in Epic WAM.    [name]   [facility] Specialty Technician

## 2020-07-26 MED FILL — AUSTEDO 6 MG TABLET: ORAL | 30 days supply | Qty: 60 | Fill #1

## 2020-07-27 DIAGNOSIS — D4989 Neoplasm of unspecified behavior of other specified sites: Secondary | ICD-10-CM | POA: Diagnosis not present

## 2020-08-08 DIAGNOSIS — N183 Chronic kidney disease, stage 3 unspecified: Secondary | ICD-10-CM | POA: Diagnosis not present

## 2020-08-22 NOTE — Unmapped (Signed)
[facility] Specialty Pharmacy Refill Coordination Note    Specialty Medication(s) to be Shipped:   Neurology: [name]    Other medication(s) to be shipped: No additional medications requested for fill at this time     Gary Rogers, DOB: Feb 27, 1956  Phone: [phone] (home)       All above HIPAA information was verified with patient.     Was a Nurse, learning disability used for this call? No    Completed refill call assessment today to schedule patient's medication shipment from the [facility] [phone]).  All relevant notes have been reviewed.     Specialty medication(s) and dose(s) confirmed: Regimen is correct and unchanged.   Changes to medications: Gwendolyn reports no changes at this time.  Changes to insurance: No  New side effects reported not previously addressed with a pharmacist or physician: None reported  Questions for the pharmacist: No    Confirmed patient received a Conservation officer, historic buildings and a Surveyor, mining with first shipment. The patient will receive a drug information handout for each medication shipped and additional FDA Medication Guides as required.       DISEASE/MEDICATION-SPECIFIC INFORMATION        N/A    SPECIALTY MEDICATION ADHERENCE     Medication Adherence    Patient reported X missed doses in the last month: 0  Specialty Medication: Austedo  Patient is on additional specialty medications: No  Patient is on more than two specialty medications: No  Any gaps in refill history greater than 2 weeks in the last 3 months: no  Demonstrates understanding of importance of adherence: yes  Informant: patient              Were doses missed due to medication being on hold? No    Austedo 6mg : Patient has 12 days of medication on hand    REFERRAL TO PHARMACIST     Referral to the pharmacist: Not needed      [facility]     Shipping address confirmed in Epic.     Delivery Scheduled: Yes, Expected medication delivery date: 5/6.     Medication will be delivered via UPS to the prescription address in Epic WAM.    [name]   [facility] Specialty Technician

## 2020-08-23 DIAGNOSIS — E1142 Type 2 diabetes mellitus with diabetic polyneuropathy: Secondary | ICD-10-CM | POA: Diagnosis not present

## 2020-08-23 DIAGNOSIS — B351 Tinea unguium: Secondary | ICD-10-CM | POA: Diagnosis not present

## 2020-08-25 MED FILL — AUSTEDO 6 MG TABLET: ORAL | 30 days supply | Qty: 60 | Fill #2

## 2020-08-30 DIAGNOSIS — Z23 Encounter for immunization: Secondary | ICD-10-CM | POA: Diagnosis not present

## 2020-09-21 NOTE — Unmapped (Signed)
[facility] Clinical Assessment & Refill Coordination Note    Gary Rogers, [place]: 1955-10-11  Phone: [phone] (home)     All above HIPAA information was verified with patient.     Was a Nurse, learning disability used for this call? No    Specialty Medication(s):   Neurology: Gary Rogers     Current Outpatient Medications   Medication Sig Dispense Refill   ??? atorvastatin (LIPITOR) 10 MG tablet Take 10 mg by mouth daily.     ??? deutetrabenazine (AUSTEDO) 6 mg Tab Take 1 tablet by mouth two (2) times a day. 60 tablet 6   ??? dulaglutide (TRULICITY) 1.5 mg/0.5 mL PnIj Inject 1.5 mg under the skin every seven (7) days.     ??? furosemide (LASIX) 20 MG tablet Take 20 mg by mouth daily.      ??? insulin degludec (TRESIBA FLEXTOUCH U-200) 200 unit/mL (3 mL) InPn Inject under the skin daily.      ??? levothyroxine (SYNTHROID, LEVOTHROID) 100 MCG tablet Take 100 mcg by mouth daily at 0600.     ??? lisinopril (PRINIVIL,ZESTRIL) 10 MG tablet Take 10 mg by mouth nightly.   3   ??? mycophenolate (CELLCEPT) 500 mg tablet Take by mouth Two (2) times a day.     ??? predniSONE (DELTASONE) 5 MG tablet Take 5 mg by mouth daily.     ??? tacrolimus (PROGRAF) 0.5 MG capsule Take 2 mg by mouth Two (2) times a day. TAKE 4 CAPSULES BY MOUTH TWICE DAILY       No current facility-administered medications for this visit.        Changes to medications: [name] reports no changes at this time.    No Known Allergies    Changes to allergies: No    SPECIALTY MEDICATION ADHERENCE     Austedo 6 mg: 13 days of medicine on hand       Medication Adherence    Patient reported X missed doses in the last month: 0  Specialty Medication: Austedo 6mg : 1 tab BID  Patient is on additional specialty medications: No  Informant: patient          Specialty medication(s) dose(s) confirmed: Regimen is correct and unchanged.     Are there any concerns with adherence? No    Adherence counseling provided? Not needed    CLINICAL MANAGEMENT AND INTERVENTION      Clinical Benefit Assessment:    Do you feel the medicine is effective or helping your condition? Yes    Clinical Benefit counseling provided? Not needed    Adverse Effects Assessment:    Are you experiencing any side effects? No    Are you experiencing difficulty administering your medicine? No    Quality of Life Assessment:    How many days over the past month did your condition  keep you from your normal activities? For example, brushing your teeth or getting up in the morning. 0    Have you discussed this with your provider? Not needed    Acute Infection Status:    Acute infections noted within Epic:  No active infections  Patient reported infection: None    Therapy Appropriateness:    Is therapy appropriate? Yes, therapy is appropriate and should be continued    DISEASE/MEDICATION-SPECIFIC INFORMATION      N/A    PATIENT SPECIFIC NEEDS     - Does the patient have any physical, cognitive, or cultural barriers? No    - Is the patient high  risk? No    - Does the patient require a Care Management Plan? No     - Does the patient require physician intervention or other additional services (i.e. nutrition, smoking cessation, social work)? No      SHIPPING     Specialty Medication(s) to be Shipped:   Neurology: Gary Rogers    Other medication(s) to be shipped: No additional medications requested for fill at this time     Changes to insurance: No    Delivery Scheduled: Yes, Expected medication delivery date: 6/7.     Medication will be delivered via UPS to the confirmed prescription address in [facility].    The patient will receive a drug information handout for each medication shipped and additional FDA Medication Guides as required.  Verified that patient has previously received a Conservation officer, historic buildings and a Surveyor, mining.    The patient or caregiver noted above participated in the development of this care plan and knows that they can request review of or adjustments to the care plan at any time.      All of the patient's questions and concerns have been addressed.    Gary Rogers   [facility] Specialty Pharmacist

## 2020-09-26 MED FILL — AUSTEDO 6 MG TABLET: ORAL | 30 days supply | Qty: 60 | Fill #3

## 2020-09-29 DIAGNOSIS — I10 Essential (primary) hypertension: Secondary | ICD-10-CM | POA: Diagnosis not present

## 2020-09-29 DIAGNOSIS — E1165 Type 2 diabetes mellitus with hyperglycemia: Secondary | ICD-10-CM | POA: Diagnosis not present

## 2020-10-04 DIAGNOSIS — Z940 Kidney transplant status: Secondary | ICD-10-CM | POA: Diagnosis not present

## 2020-10-04 DIAGNOSIS — R809 Proteinuria, unspecified: Secondary | ICD-10-CM | POA: Diagnosis not present

## 2020-10-04 DIAGNOSIS — I10 Essential (primary) hypertension: Secondary | ICD-10-CM | POA: Diagnosis not present

## 2020-10-04 DIAGNOSIS — E782 Mixed hyperlipidemia: Secondary | ICD-10-CM | POA: Diagnosis not present

## 2020-10-04 DIAGNOSIS — E039 Hypothyroidism, unspecified: Secondary | ICD-10-CM | POA: Diagnosis not present

## 2020-10-04 DIAGNOSIS — F5221 Male erectile disorder: Secondary | ICD-10-CM | POA: Diagnosis not present

## 2020-10-04 DIAGNOSIS — G255 Other chorea: Secondary | ICD-10-CM | POA: Diagnosis not present

## 2020-10-04 DIAGNOSIS — Z8619 Personal history of other infectious and parasitic diseases: Secondary | ICD-10-CM | POA: Diagnosis not present

## 2020-10-04 DIAGNOSIS — E875 Hyperkalemia: Secondary | ICD-10-CM | POA: Diagnosis not present

## 2020-10-04 DIAGNOSIS — D509 Iron deficiency anemia, unspecified: Secondary | ICD-10-CM | POA: Diagnosis not present

## 2020-10-04 DIAGNOSIS — N1831 Chronic kidney disease, stage 3a: Secondary | ICD-10-CM | POA: Diagnosis not present

## 2020-10-04 DIAGNOSIS — E1169 Type 2 diabetes mellitus with other specified complication: Secondary | ICD-10-CM | POA: Diagnosis not present

## 2020-10-07 DIAGNOSIS — Z940 Kidney transplant status: Secondary | ICD-10-CM | POA: Diagnosis not present

## 2020-10-07 DIAGNOSIS — N189 Chronic kidney disease, unspecified: Secondary | ICD-10-CM | POA: Diagnosis not present

## 2020-10-10 DIAGNOSIS — Z940 Kidney transplant status: Secondary | ICD-10-CM | POA: Diagnosis not present

## 2020-10-10 MED ORDER — AMLODIPINE 5 MG TABLET
Freq: Every day | ORAL | 0 days
Start: 2020-10-10 — End: ?

## 2020-10-17 ENCOUNTER — Ambulatory Visit: Admit: 2020-10-17 | Discharge: 2020-10-17 | Payer: MEDICARE

## 2020-10-17 ENCOUNTER — Ambulatory Visit: Admit: 2020-10-17 | Discharge: 2020-10-17 | Payer: MEDICARE | Attending: Neurology | Primary: Neurology

## 2020-10-17 DIAGNOSIS — G255 Other chorea: Secondary | ICD-10-CM | POA: Diagnosis not present

## 2020-10-17 DIAGNOSIS — Z6828 Body mass index (BMI) 28.0-28.9, adult: Secondary | ICD-10-CM | POA: Diagnosis not present

## 2020-10-17 DIAGNOSIS — Z5181 Encounter for therapeutic drug level monitoring: Secondary | ICD-10-CM | POA: Diagnosis not present

## 2020-10-17 LAB — CBC W/ AUTO DIFF
BASOPHILS ABSOLUTE COUNT: 0.1 10*9/L (ref 0.0–0.1)
BASOPHILS RELATIVE PERCENT: 0.6 %
EOSINOPHILS ABSOLUTE COUNT: 0.1 10*9/L (ref 0.0–0.5)
EOSINOPHILS RELATIVE PERCENT: 0.9 %
HEMATOCRIT: 38.4 % — ABNORMAL LOW (ref 39.0–48.0)
HEMOGLOBIN: 12.9 g/dL (ref 12.9–16.5)
LYMPHOCYTES ABSOLUTE COUNT: 3.5 10*9/L (ref 1.1–3.6)
LYMPHOCYTES RELATIVE PERCENT: 34.6 %
MEAN CORPUSCULAR HEMOGLOBIN CONC: 33.5 g/dL (ref 32.0–36.0)
MEAN CORPUSCULAR HEMOGLOBIN: 29.3 pg (ref 25.9–32.4)
MEAN CORPUSCULAR VOLUME: 87.6 fL (ref 77.6–95.7)
MEAN PLATELET VOLUME: 6.8 fL (ref 6.8–10.7)
MONOCYTES ABSOLUTE COUNT: 0.7 10*9/L (ref 0.3–0.8)
MONOCYTES RELATIVE PERCENT: 6.8 %
NEUTROPHILS ABSOLUTE COUNT: 5.8 10*9/L (ref 1.8–7.8)
NEUTROPHILS RELATIVE PERCENT: 57.1 %
PLATELET COUNT: 298 10*9/L (ref 150–450)
RED BLOOD CELL COUNT: 4.39 10*12/L (ref 4.26–5.60)
RED CELL DISTRIBUTION WIDTH: 14.6 % (ref 12.2–15.2)
WBC ADJUSTED: 10.1 10*9/L (ref 3.6–11.2)

## 2020-10-17 NOTE — Unmapped (Signed)
INSTRUCTIONS:  1.        INSTRUCTIONS:  1.  After review today, happy to see that your Austedo continues to help you benefit in terms of reduced movements overall  2.  Today we completed video assessment for comparison in the future  3.  We agreed to maintain Austedo unchanged, and to complete surveillance monitoring which includes an EKG and CBC with differential (lab work)  4.  Otherwise, return to clinic in 1 year, or sooner if need be

## 2020-10-17 NOTE — Unmapped (Signed)
[facility] [address]  [facility] NEUROLOG[facility] CLINIC [place] [address] [place]  [address]  [place] [address]  [phone]    Date: 10/17/2020  Patient Name: Gary Rogers  MRN: [id number]  PCP: [facility], Maryjo Rochester, *    Assessment:      Mr. Gary Rogers is a 65 y.o. male who  has a past medical history of Anxiety, Chorea, Chronic kidney disease, Depression, Diabetes mellitus, type 2 (CMS-HCC), Hyperlipidemia, Hypertension, and Hyperthyroidism. presenting in follow up for evaluation of mostly generalized, asymmetric, adventitious movements sparing the upper face (left greater than right), historically beginning in a rather acute/subacute fashion in 2009 when he apparently had very poorly controlled blood sugars and development/appreciation of chronic kidney disease requiring renal transplant, with subsequent improvement of his chorea both subjectively and apparently objectively through follow-up clinic notes from the [place] [facility] [place] movement disorder group initially after performing renal transplant, subsequently with introduction of neuroleptics and most recently dopamine depletors, likely etiology of 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    1. 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 He develops parakinesias during examination, with a nonstereoptyped movement that flows from joint to joint, matching with suspected choreiform disorder. Some of his speech difficulties could be apraxic in nature versus ataxic in nature should he have had any involvement of his brainstem/cerebellum with the underlying toxic/metabolic process, but may just as well be related to lower facial chorea. I might argue the latter case after seeing his lower facial dyskinesias more vividly in the past off of therapy.  Patient historically has benefited from tetrabenazine with more evident EPS symptoms that were even bothering the patient for which reason we discontinued therapy.  Deutetrabenazine has been well-tolerated with what appear to be minimal EPS symptoms which are not bothering the patient, and overall feels as though his choreiform movement is well controlled.    Since last visit with me, patient has been on Austedo and tolerated the medication well.  Arguably there are some EPS symptoms but they are quite mild at the current state, and per the patient not bothersome to him unlike when he was taking tetrabenazine.  He complains of some balance issues which, as best I can tell, seem to be related to postural reflex difficulties as I could not appreciate evidence for sensory ataxia, cerebellar ataxia, but there was some evidence of retropulsion on direct testing (nothing spontaneous).  Perhaps mixed with residual chorea this could be problematic for the patient when ambulating and otherwise standing, but for now he wishes to make no further adjustments to pharmacotherapy and he continues to remain physically active, exercising regularly and using a machine like a stairmaster to help with large amplitude movements to keep him mobile, and keep his balance and check.  I have applauded him on his efforts, and recommended that he continue this if possible (as long as he can do so safely).  Consider Parkinson specific physical therapy in the future such as LSVT big physical therapy exercises, [employer] or even [employer] which should address the postural reflex difficulties not unlike folks  with primary parkinsonism.    Would like to have the patient complete surveillance monitoring including EKG and CBC with differential for his Austedo.  Otherwise we will continue current dose of Austedo unchanged, though certainly could consider augmentation in the future since he is taking quite low doses at the moment.  Patient should return for annual visit, or sooner if need be.    I personally spent 38 minutes face-to-face and non-face-to-face in the care of this patient, which includes all pre, intra, and post visit time on the date of service.      Plan:      1.  After review today, happy to see that your Austedo continues to help you benefit in terms of reduced movements overall  2.  Today we completed video assessment for comparison in the future  3.  We agreed to maintain Austedo unchanged, and to complete surveillance monitoring which includes an EKG and CBC with differential (lab work)  4.  Otherwise, return to clinic in 1 year, or sooner if need be     *Patient note was created using Office manager.  Any errors in syntax or even information may not have been identified and edited on initial review prior to signing this note.     Subjective:      HPI: Patient is a 65 y.o. left-handed Caucasian male with past medical history of chronic kidney disease status post renal transplant in 2010, diabetes mellitus previously quite poorly controlled, who presents for follow up of adventitious movements that have been present throughout the body since approximately 2009. These were felt to be toxic-metabolic induced choreaform movements. Please review my initial clinic visit note dated on 09/29.2016 for more information regarding history of present illness.    Interim history: Patient presents today alone.  It has been effectively a year and a half since testing the patient if not longer, during which time he reports that Austedo has been quite helpful at maintaining his chorea at bay.  He feels like most days this is very well controlled and it does not bother him much at all.  He does appreciate that there are times he will drop objects out of his hands, but he really never spits any food out of his mouth or liquids.  No swallowing trouble whatsoever per his report.  He denies any difficulty with balance related to his chorea, but has appreciated some mild balance difficulties but thankfully without falls.  He is simply very cautious about his gait to avoid those difficulties.    With the historical and continued extrapyramidal symptoms that are mild, he denies any stiffness or rigidity that he can appreciate, and he feels like his slowness is truly more deliberate on his end.  He is very cautious to ensure that he does not have any falls.  He is quite content as of recent since he managed to marry his partner and recent history and even traveled down to [place] successfully without difficulties.    He denies wearing dentures.    CHOREA/DYSKINESIA REGIMEN:  Austedo 6 mg 1 tablet twice a day    On review of systems, the patient denies hallucinations, impulsivity, irritability, current memory deficits. He denies any aspiration spells. Denies double vision. Denies loss of consciousness episodes, denies lightheadedness. Denies urinary urgency or frequency/hesitancy of urination.  Denies any falls since last visit with me.    Past Medical Hx:    Past Medical History:   Diagnosis Date   ??? Anxiety    ???  Chorea    ??? Chronic kidney disease    ??? Depression    ??? Diabetes mellitus, type 2 (CMS-HCC)    ??? Hyperlipidemia    ??? Hypertension    ??? Hyperthyroidism        Past Surgical Hx:    Past Surgical History:   Procedure Laterality Date   ??? NEPHRECTOMY TRANSPLANTED ORGAN Bilateral 2010       Social Hx:    Social History     Socioeconomic History   ??? Marital status: Widowed   Tobacco Use   ??? Smoking status: Never Smoker   ??? Smokeless tobacco: Never Used   Substance and Sexual Activity   ??? Alcohol use: Yes     Comment: 1 per month ??? Drug use: No   ??? Sexual activity: Not Currently   Social History Narrative    1.  Retired Naval architect    2.  Lives alone, but nearby his mother in [place].    3.  Patient is a widower    4.  Denies any known exposure to toxic substances such as industrial pesticides or radiation.       Family Hx:    Family History   Problem Relation Age of Onset   ??? Thyroid disease Mother    ??? Brain cancer Father    ??? Hearing loss Father    ??? No Known Problems Sister    ??? Diabetes Maternal Grandfather    ??? No Known Problems Sister    ??? Hepatitis Son         Hepatitis C   ??? No Known Problems Daughter        ALLERGIES:  No Known Allergies    CURRENT MEDICATIONS:    Current Outpatient Medications   Medication Sig Dispense Refill   ??? atorvastatin (LIPITOR) 10 MG tablet Take 10 mg by mouth daily.     ??? deutetrabenazine (AUSTEDO) 6 mg Tab Take 1 tablet by mouth two (2) times a day. 60 tablet 6   ??? dulaglutide 1.5 mg/0.5 mL PnIj Inject 3 mg under the skin every seven (7) days.     ??? furosemide (LASIX) 20 MG tablet Take 20 mg by mouth daily.      ??? insulin degludec (TRESIBA FLEXTOUCH U-200) 200 unit/mL (3 mL) InPn Inject under the skin daily.      ??? levothyroxine (SYNTHROID, LEVOTHROID) 100 MCG tablet Take 100 mcg by mouth daily at 0600.     ??? lisinopril (PRINIVIL,ZESTRIL) 10 MG tablet Take 10 mg by mouth nightly.   3   ??? mycophenolate (CELLCEPT) 500 mg tablet Take by mouth Two (2) times a day.     ??? predniSONE (DELTASONE) 5 MG tablet Take 5 mg by mouth daily.     ??? tacrolimus (PROGRAF) 0.5 MG capsule Take 1.5 mg by mouth Two (2) times a day. TAKE 4 CAPSULES BY MOUTH TWICE DAILY       No current facility-administered medications for this visit.        Objective:      Physical Exam:  Vitals:    10/17/20 0935 10/17/20 0936   Orthostatic BP: 164/79 124/55   Orthostatic Pulse: 77 70   BP Site: L Arm L Arm   BP Position: Sitting Standing   BP Cuff Size: Erline Levine        Neurological Examination:   MS: AAOx3, naming/fluency/repetition intact. Following lateralizing commands across midline     Cranial nerves: PERRL, approx. 4.5 to 3.57mm brisk  bilaterally. EOMI except for inability to bury sclera on bilateral, lateral gaze, (abducting eye only (even with monocular testing)). No end gaze horizontal nystagmus appreciated, no interrupting saccades.  No evidence today for hypometric nor hypermetric saccades.  VFF to confrontation. No facial asymmetry. Tongue protrudes midline.  V1-3 intact to light touch and pinprick. SCM and trapezius 5/5 bilaterally.    Motor: symmetric bulk throughout. No fasciculations.     LEFT Delt 5/5 Tricep 5/5 Bicep 5/5 WExt 5/5 WFlex 5/5 Interr 5/5   HpFlex 5/5 QFem 5/5 Hamst 5/5 ADF 5/5 APF 5/5   RIGHT Delt 5/5 Tricep 5/5 Bicep 5/5 WExt 5/5 WFlex 5/5 Interr 5/5   HpFlex 5/5 QFem 5/5 Hamst 5/5 ADF 5/5 APF 5/5    MOVEMENT DISORDERS EXAMINATION:  Facial expression was normal . Volume of speech was normal . Handwriting was not assessed. There was no evidence of rest tremor. There was no evidence of postural tremor. There was no evidence of action tremor. Tone was normal in the neck and tone was normal in the right upper extremity, right lower extremity and left lower extremity, but debatably there was increased tone in the left upper extremity today (definitely influenced by paratonias).  Rapid alternating movements were normal in amplitude and speed with no breakdown in the right upper extremity, left upper extremity, right lower extremity and left lower extremity.  Foot tap was slow bilaterally. He could get up from a low lying chair without help of both hands..  Gait was steady with good heel to toe stride without alternating length of stride.  Arm swing was normal bilaterally with moderate parakinesias.     Video assessment was completed for future comparison.    In addition, there were evident choreiform movements of the upper extremities, right upper extremity mildly worse than left upper extremity again, not present in the trunk; present in lower limbs only while seated today, not while ambulating (right worse than left). There is no evidence of dysdiadochokinesia. There is no evidence of a milkmaid grip bilaterally today. Able to maintain prolonged tongue protrusion without difficulty for over 30 seconds.  Minimal orobuccolingual movements appreciated without upper facial involvement.  Please see AIMS score below mostly for monitoring purposes.    Reflexes: flexor plantar responses bilaterally     LEFT Bicep 2 Tricep 2 BrachR 2 Patell 2 AJ 1 Hoffman absent  RIGHT Bicep 2 Tricep 2 BrachR 2 Patell 2 AJ 1 Hoffman absent    A = absent   P = present   B = Brisk     Coordination: Finger-nose testing bilaterally demonstrates no evidence of dysmetria or ataxia.    Testing:      AIMS TARDIVE DYSKINESIA SCALE    Facial and Oral Movements  1. Muscles of facial expression (e.g. movements of forehead, eyebrows, periorbital area, cheeks. Include frowning, blinking, grimacing of upper face)   1 = minimal (may be extreme normal)    2. Lips and perioral area (e.g. puckering, pouting, smacking)  1 = minimal (may be extreme normal)    3. Jaw (biting, clenching, chewing, mouth opening, lateral movement)  2 = mild    4. Tongue (rate only increase in movement both in and out of mouth, not inability to sustain movement)  1 = minimal (may be extreme normal)    Extremity Movements   5. Upper (arms, wrists, hands, fingers; include movements that are choreic or athetoid.  Do not include tremor)  3 = moderate     6. Lower (legs, knees, ankles, toes; e.g. lateral knee movement, foot tapping, heel dropping, foot squirming, inversion and eversion of foot)            3 = moderate    Trunk Movements   7.  Neck, shoulders, hips (rocking, twisting, squirming, pelvic gyrations.  Include diaphragmatic movements)   2 = mild    Global Judgements  8.  Severity of abnormal movements          2 = mild    Based on the highest single score on the above items  9. Incapacitation due to abnormal movements  1 = minimal    10.  Patient's awareness of abnormal movements  2 = aware, mild distress    11.  Current problems with teeth and/or dentures  0 = no    12.  Does patient usually wear dentures?  0 = no  ______________________________________________________________________  TOTAL: 18

## 2020-10-18 NOTE — Unmapped (Addendum)
Call from patient advising that he would like to have the EKG done in [place] which is nearer home.    Called patient to ascertain to what facility he would like EKG referral faxed.  Line was busy.    Called again. Line busy.    [phone].Call back. Line busy.

## 2020-10-18 NOTE — Unmapped (Signed)
[facility] Refill Coordination Note    Specialty Medication(s) to be Shipped:   Neurology: [name]    Other medication(s) to be shipped: No additional medications requested for fill at this time     Gary Rogers, DOB: [birth year]  Phone: [phone] (home)       All above HIPAA information was verified with patient.     Was a Nurse, learning disability used for this call? No    Completed refill call assessment today to schedule patient's medication shipment from the [facility] [phone]).  All relevant notes have been reviewed.     Specialty medication(s) and dose(s) confirmed: Regimen is correct and unchanged.   Changes to medications: Brien reports no changes at this time.  Changes to insurance: No  New side effects reported not previously addressed with a pharmacist or physician: None reported  Questions for the pharmacist: No    Confirmed patient received a Conservation officer, historic buildings and a Surveyor, mining with first shipment. The patient will receive a drug information handout for each medication shipped and additional FDA Medication Guides as required.       DISEASE/MEDICATION-SPECIFIC INFORMATION        N/A    SPECIALTY MEDICATION ADHERENCE     Medication Adherence    Patient reported X missed doses in the last month: 0  Specialty Medication: Austedo  Patient is on additional specialty medications: No  Patient is on more than two specialty medications: No  Any gaps in refill history greater than 2 weeks in the last 3 months: no  Demonstrates understanding of importance of adherence: yes  Informant: patient  Reliability of informant: reliable  Provider-estimated medication adherence level: good  Patient is at risk for Non-Adherence: No              Were doses missed due to medication being on hold? No    Austedo 6 mg: 14 days of medicine on hand         REFERRAL TO PHARMACIST     Referral to the pharmacist: Not needed      [facility]     Shipping address confirmed in Epic. Delivery Scheduled: Yes, Expected medication delivery date: 07/07.     Medication will be delivered via UPS to the prescription address in Epic WAM.    Gary Rogers   [facility] Specialty Technician

## 2020-10-26 MED FILL — AUSTEDO 6 MG TABLET: ORAL | 30 days supply | Qty: 60 | Fill #4

## 2020-11-01 DIAGNOSIS — Z940 Kidney transplant status: Secondary | ICD-10-CM | POA: Diagnosis not present

## 2020-11-01 DIAGNOSIS — B351 Tinea unguium: Secondary | ICD-10-CM | POA: Diagnosis not present

## 2020-11-01 DIAGNOSIS — E1142 Type 2 diabetes mellitus with diabetic polyneuropathy: Secondary | ICD-10-CM | POA: Diagnosis not present

## 2020-11-16 NOTE — Unmapped (Signed)
[facility] Refill Coordination Note    Specialty Medication(s) to be Shipped:   Neurology: Gary Rogers    Other medication(s) to be shipped: No additional medications requested for fill at this time     Gary Rogers, DOB: Aug 11, 1955  Phone: [phone] (home)       All above HIPAA information was verified with patient.     Was a Nurse, learning disability used for this call? No    Completed refill call assessment today to schedule patient's medication shipment from the [facility] [phone]).  All relevant notes have been reviewed.     Specialty medication(s) and dose(s) confirmed: Regimen is correct and unchanged.   Changes to medications: Gary Rogers reports no changes at this time.  Changes to insurance: No  New side effects reported not previously addressed with a pharmacist or physician: None reported  Questions for the pharmacist: No    Confirmed patient received a Conservation officer, historic buildings and a Surveyor, mining with first shipment. The patient will receive a drug information handout for each medication shipped and additional FDA Medication Guides as required.       DISEASE/MEDICATION-SPECIFIC INFORMATION        N/A    SPECIALTY MEDICATION ADHERENCE     Medication Adherence    Patient reported X missed doses in the last month: 0  Specialty Medication: Austedo  Patient is on additional specialty medications: No  Patient is on more than two specialty medications: No  Any gaps in refill history greater than 2 weeks in the last 3 months: no  Demonstrates understanding of importance of adherence: yes  Informant: patient              Were doses missed due to medication being on hold? No    Austedo 6mg : Patient has 14 days of medication on hand    REFERRAL TO PHARMACIST     Referral to the pharmacist: Not needed      [facility]     Shipping address confirmed in Epic.     Delivery Scheduled: Yes, Expected medication delivery date: 8/8.     Medication will be delivered via UPS to the prescription address in Epic WAM.    Gary Rogers   [facility] Specialty Technician

## 2020-11-22 DIAGNOSIS — Z20822 Contact with and (suspected) exposure to covid-19: Secondary | ICD-10-CM | POA: Diagnosis not present

## 2020-11-25 MED FILL — AUSTEDO 6 MG TABLET: ORAL | 30 days supply | Qty: 60 | Fill #5

## 2020-12-13 DIAGNOSIS — N189 Chronic kidney disease, unspecified: Secondary | ICD-10-CM | POA: Diagnosis not present

## 2020-12-13 DIAGNOSIS — Z940 Kidney transplant status: Secondary | ICD-10-CM | POA: Diagnosis not present

## 2020-12-20 NOTE — Unmapped (Signed)
[facility] Refill Coordination Note    Specialty Medication(s) to be Shipped:   Neurology: Gary Rogers    Other medication(s) to be shipped: No additional medications requested for fill at this time     Gary Rogers, DOB: [birth year]  Phone: [phone] (home)       All above HIPAA information was verified with patient.     Was a Nurse, learning disability used for this call? No    Completed refill call assessment today to schedule patient's medication shipment from the [facility] [phone]).  All relevant notes have been reviewed.     Specialty medication(s) and dose(s) confirmed: Regimen is correct and unchanged.   Changes to medications: Gary Rogers reports no changes at this time.  Changes to insurance: No  New side effects reported not previously addressed with a pharmacist or physician: None reported  Questions for the pharmacist: No    Confirmed patient received a Conservation officer, historic buildings and a Surveyor, mining with first shipment. The patient will receive a drug information handout for each medication shipped and additional FDA Medication Guides as required.       DISEASE/MEDICATION-SPECIFIC INFORMATION        N/A    SPECIALTY MEDICATION ADHERENCE     Medication Adherence    Patient reported X missed doses in the last month: 0  Specialty Medication: Austedo  Patient is on additional specialty medications: No  Patient is on more than two specialty medications: No  Any gaps in refill history greater than 2 weeks in the last 3 months: no  Demonstrates understanding of importance of adherence: yes  Informant: patient  Reliability of informant: reliable  Provider-estimated medication adherence level: good  Patient is at risk for Non-Adherence: No  Reasons for non-adherence: no problems identified              Were doses missed due to medication being on hold? No    Austedo 6 mg: 14 days of medicine on hand       REFERRAL TO PHARMACIST     Referral to the pharmacist: Not needed      [facility] Shipping address confirmed in Epic.     Delivery Scheduled: Yes, Expected medication delivery date: 09/08.     Medication will be delivered via UPS to the prescription address in Epic WAM.    [name]   [facility] Specialty Technician

## 2020-12-27 DIAGNOSIS — E039 Hypothyroidism, unspecified: Secondary | ICD-10-CM | POA: Diagnosis not present

## 2020-12-27 DIAGNOSIS — I10 Essential (primary) hypertension: Secondary | ICD-10-CM | POA: Diagnosis not present

## 2020-12-27 DIAGNOSIS — E1169 Type 2 diabetes mellitus with other specified complication: Secondary | ICD-10-CM | POA: Diagnosis not present

## 2020-12-28 MED FILL — AUSTEDO 6 MG TABLET: ORAL | 30 days supply | Qty: 60 | Fill #6

## 2020-12-30 DIAGNOSIS — D509 Iron deficiency anemia, unspecified: Secondary | ICD-10-CM | POA: Diagnosis not present

## 2020-12-30 DIAGNOSIS — Z8619 Personal history of other infectious and parasitic diseases: Secondary | ICD-10-CM | POA: Diagnosis not present

## 2020-12-30 DIAGNOSIS — G255 Other chorea: Secondary | ICD-10-CM | POA: Diagnosis not present

## 2020-12-30 DIAGNOSIS — E875 Hyperkalemia: Secondary | ICD-10-CM | POA: Diagnosis not present

## 2020-12-30 DIAGNOSIS — E039 Hypothyroidism, unspecified: Secondary | ICD-10-CM | POA: Diagnosis not present

## 2020-12-30 DIAGNOSIS — I10 Essential (primary) hypertension: Secondary | ICD-10-CM | POA: Diagnosis not present

## 2020-12-30 DIAGNOSIS — E782 Mixed hyperlipidemia: Secondary | ICD-10-CM | POA: Diagnosis not present

## 2020-12-30 DIAGNOSIS — E1169 Type 2 diabetes mellitus with other specified complication: Secondary | ICD-10-CM | POA: Diagnosis not present

## 2020-12-30 DIAGNOSIS — Z23 Encounter for immunization: Secondary | ICD-10-CM | POA: Diagnosis not present

## 2020-12-30 DIAGNOSIS — R809 Proteinuria, unspecified: Secondary | ICD-10-CM | POA: Diagnosis not present

## 2020-12-30 DIAGNOSIS — N1831 Chronic kidney disease, stage 3a: Secondary | ICD-10-CM | POA: Diagnosis not present

## 2020-12-30 DIAGNOSIS — Z940 Kidney transplant status: Secondary | ICD-10-CM | POA: Diagnosis not present

## 2021-01-10 DIAGNOSIS — E1142 Type 2 diabetes mellitus with diabetic polyneuropathy: Secondary | ICD-10-CM | POA: Diagnosis not present

## 2021-01-10 DIAGNOSIS — U071 COVID-19: Secondary | ICD-10-CM | POA: Diagnosis not present

## 2021-01-10 DIAGNOSIS — B351 Tinea unguium: Secondary | ICD-10-CM | POA: Diagnosis not present

## 2021-01-19 DIAGNOSIS — Z940 Kidney transplant status: Secondary | ICD-10-CM | POA: Diagnosis not present

## 2021-01-19 DIAGNOSIS — E1122 Type 2 diabetes mellitus with diabetic chronic kidney disease: Secondary | ICD-10-CM | POA: Diagnosis not present

## 2021-01-19 DIAGNOSIS — I120 Hypertensive chronic kidney disease with stage 5 chronic kidney disease or end stage renal disease: Secondary | ICD-10-CM | POA: Diagnosis not present

## 2021-01-19 DIAGNOSIS — Z79899 Other long term (current) drug therapy: Secondary | ICD-10-CM | POA: Diagnosis not present

## 2021-01-19 DIAGNOSIS — N186 End stage renal disease: Secondary | ICD-10-CM | POA: Diagnosis not present

## 2021-01-19 DIAGNOSIS — E875 Hyperkalemia: Secondary | ICD-10-CM | POA: Diagnosis not present

## 2021-01-19 DIAGNOSIS — Z23 Encounter for immunization: Secondary | ICD-10-CM | POA: Diagnosis not present

## 2021-01-20 DIAGNOSIS — G255 Other chorea: Principal | ICD-10-CM

## 2021-01-20 DIAGNOSIS — G2401 Drug induced subacute dyskinesia: Principal | ICD-10-CM

## 2021-01-20 MED ORDER — AUSTEDO 6 MG TABLET
ORAL_TABLET | Freq: Two times a day (BID) | ORAL | 6 refills | 30.00000 days
Start: 2021-01-20 — End: ?

## 2021-01-20 NOTE — Unmapped (Signed)
[facility] Refill Coordination Note    Specialty Medication(s) to be Shipped:   Neurology: Gary Rogers    Other medication(s) to be shipped: No additional medications requested for fill at this time     Gary Rogers, DOB: Jul 12, 1955  Phone: [phone] (home)       All above HIPAA information was verified with patient.     Was a Nurse, learning disability used for this call? No    Completed refill call assessment today to schedule patient's medication shipment from the [facility] [phone]).  All relevant notes have been reviewed.     Specialty medication(s) and dose(s) confirmed: Regimen is correct and unchanged.   Changes to medications: Gary Rogers reports no changes at this time.  Changes to insurance: No  New side effects reported not previously addressed with a pharmacist or physician: None reported  Questions for the pharmacist: No    Confirmed patient received a Conservation officer, historic buildings and a Surveyor, mining with first shipment. The patient will receive a drug information handout for each medication shipped and additional FDA Medication Guides as required.       DISEASE/MEDICATION-SPECIFIC INFORMATION        N/A    SPECIALTY MEDICATION ADHERENCE     Medication Adherence    Patient reported X missed doses in the last month: 0  Specialty Medication: Austedo  Patient is on additional specialty medications: No  Patient is on more than two specialty medications: No  Any gaps in refill history greater than 2 weeks in the last 3 months: no  Demonstrates understanding of importance of adherence: yes  Informant: patient              Were doses missed due to medication being on hold? No    austedo 6mg : Patient has 14 days of medication on hand    REFERRAL TO PHARMACIST     Referral to the pharmacist: Not needed      [facility]     Shipping address confirmed in Epic.     Delivery Scheduled: Yes, Expected medication delivery date: 10/12.  However, Rx request for refills was sent to the provider as there are none remaining.     Medication will be delivered via UPS to the prescription address in Epic WAM.    Gary Rogers   [facility] Specialty Technician

## 2021-01-23 NOTE — Unmapped (Signed)
Last Visit Date: 10/17/2020  Next Visit Date: No appointment scheduled    No results found for: CBC, CMP     Results for orders placed or performed in visit on 04/07/18   ECG 12 lead   Result Value Ref Range    EKG Systolic BP  mmHg    EKG Diastolic BP  mmHg    EKG Ventricular Rate 71 BPM    EKG Atrial Rate 71 BPM    EKG P-R Interval 144 ms    EKG QRS Duration 88 ms    EKG Q-T Interval 366 ms    EKG QTC Calculation 397 ms    EKG Calculated P Axis 64 degrees    EKG Calculated R Axis 21 degrees    EKG Calculated T Axis 67 degrees    QTC Fredericia 387 ms

## 2021-01-27 MED ORDER — AUSTEDO 6 MG TABLET
ORAL_TABLET | Freq: Two times a day (BID) | ORAL | 6 refills | 30 days | Status: CP
Start: 2021-01-27 — End: ?
  Filled 2021-01-31: qty 60, 30d supply, fill #0

## 2021-01-27 NOTE — Unmapped (Signed)
Refilling chronic medication prescription of   Requested Prescriptions     Pending Prescriptions Disp Refills   ??? deutetrabenazine (AUSTEDO) 6 mg Tab 60 tablet 6     Sig: Take 1 tablet (6 mg total) by mouth two (2) times a day.   Rhina Brackett to requested pharmacy.

## 2021-02-07 DIAGNOSIS — Z940 Kidney transplant status: Secondary | ICD-10-CM | POA: Diagnosis not present

## 2021-02-07 DIAGNOSIS — N189 Chronic kidney disease, unspecified: Secondary | ICD-10-CM | POA: Diagnosis not present

## 2021-02-21 DIAGNOSIS — H1189 Other specified disorders of conjunctiva: Secondary | ICD-10-CM | POA: Diagnosis not present

## 2021-02-23 DIAGNOSIS — Z20822 Contact with and (suspected) exposure to covid-19: Secondary | ICD-10-CM | POA: Diagnosis not present

## 2021-02-28 NOTE — Unmapped (Signed)
[facility] Clinical Assessment & Refill Coordination Note    Gary Rogers, [place]: 12-24-1955  Phone: [phone] (home)     All above HIPAA information was verified with patient.     Was a Nurse, learning disability used for this call? No    Specialty Medication(s):   Neurology: [name]     Current Outpatient Medications   Medication Sig Dispense Refill   ??? amLODIPine (NORVASC) 5 MG tablet Take 5 mg by mouth in the morning. (Patient not taking: Reported on 02/28/2021)     ??? atorvastatin (LIPITOR) 10 MG tablet Take 10 mg by mouth daily.     ??? CAPTOPRIL ORAL Take 6.25 mg by mouth two (2) times a day.     ??? deutetrabenazine (AUSTEDO) 6 mg Tab Take 1 tablet (6 mg total) by mouth two (2) times a day. 60 tablet 6   ??? dulaglutide 1.5 mg/0.5 mL PnIj Inject 3 mg under the skin every seven (7) days.     ??? furosemide (LASIX) 20 MG tablet Take 20 mg by mouth daily.      ??? insulin degludec 200 unit/mL (3 mL) InPn Inject under the skin daily.      ??? levothyroxine (SYNTHROID, LEVOTHROID) 100 MCG tablet Take 100 mcg by mouth daily at 0600.     ??? lisinopril (PRINIVIL,ZESTRIL) 10 MG tablet Take 10 mg by mouth nightly.  (Patient not taking: Reported on 02/28/2021)  3   ??? mycophenolate (CELLCEPT) 500 mg tablet Take by mouth Two (2) times a day.     ??? predniSONE (DELTASONE) 5 MG tablet Take 5 mg by mouth daily.     ??? risperiDONE (RISPERDAL) 0.25 MG tablet Take 0.25 mg by mouth Two (2) times a day. Per patient     ??? sodium zirconium cyclosilicate (LOKELMA) 10 gram PwPk packet Lokelma 10 gram oral powder packet   TAKE 1 PACKET 3 x day     ??? tacrolimus (PROGRAF) 0.5 MG capsule Take 1.5 mg by mouth Two (2) times a day. TAKE 4 CAPSULES BY MOUTH TWICE DAILY       No current facility-administered medications for this visit.        Changes to medications: Gary Rogers reports stopping lisinopril and amlodipine.  Gary Rogers reports starting captopril 6.25 mg twice daily    No Known Allergies    Changes to allergies: No    SPECIALTY MEDICATION ADHERENCE     Austedo 6 mg: 12 days of medicine on hand     Medication Adherence    Patient reported X missed doses in the last month: 0  Specialty Medication: Austedo 6 mg twice daily  Patient is on additional specialty medications: No  Informant: patient  Confirmed plan for next specialty medication refill: delivery by pharmacy  Refills needed for supportive medications: not needed          Specialty medication(s) dose(s) confirmed: Regimen is correct and unchanged.     Are there any concerns with adherence? No    Adherence counseling provided? Not needed    CLINICAL MANAGEMENT AND INTERVENTION      Clinical Benefit Assessment:    Do you feel the medicine is effective or helping your condition? Yes    Clinical Benefit counseling provided? Not needed    Adverse Effects Assessment:    Are you experiencing any side effects? No    Are you experiencing difficulty administering your medicine? No    Quality of Life Assessment:    Quality of Life  Rheumatology  Oncology  Dermatology  Cystic Fibrosis          How many days over the past month did your Chorea: Tardive dyskinesia  keep you from your normal activities? For example, brushing your teeth or getting up in the morning. 0    Have you discussed this with your provider? Not needed    Acute Infection Status:    Acute infections noted within Epic:  No active infections  Patient reported infection: None    Therapy Appropriateness:    Is therapy appropriate and patient progressing towards therapeutic goals? Yes, therapy is appropriate and should be continued    DISEASE/MEDICATION-SPECIFIC INFORMATION      N/A    PATIENT SPECIFIC NEEDS     - Does the patient have any physical, cognitive, or cultural barriers? No    - Is the patient high risk? No    - Does the patient require a Care Management Plan? No     - Does the patient require physician intervention or other additional services (i.e. nutrition, smoking cessation, social work)? No      SHIPPING Specialty Medication(s) to be Shipped:   Neurology: [name]    Other medication(s) to be shipped: No additional medications requested for fill at this time     Changes to insurance: No    Delivery Scheduled: Yes, Expected medication delivery date: 03/10/21.     Medication will be delivered via UPS to the confirmed prescription address in [facility].    The patient will receive a drug information handout for each medication shipped and additional FDA Medication Guides as required.  Verified that patient has previously received a Conservation officer, historic buildings and a Surveyor, mining.    The patient or caregiver noted above participated in the development of this care plan and knows that they can request review of or adjustments to the care plan at any time.      All of the patient's questions and concerns have been addressed.    Breck Coons [facility] Specialty Pharmacist

## 2021-03-02 DIAGNOSIS — G255 Other chorea: Principal | ICD-10-CM

## 2021-03-09 MED FILL — AUSTEDO 6 MG TABLET: ORAL | 30 days supply | Qty: 60 | Fill #1

## 2021-03-21 DIAGNOSIS — Z23 Encounter for immunization: Secondary | ICD-10-CM | POA: Diagnosis not present

## 2021-03-21 DIAGNOSIS — E113293 Type 2 diabetes mellitus with mild nonproliferative diabetic retinopathy without macular edema, bilateral: Secondary | ICD-10-CM | POA: Diagnosis not present

## 2021-03-28 DIAGNOSIS — B351 Tinea unguium: Secondary | ICD-10-CM | POA: Diagnosis not present

## 2021-03-28 DIAGNOSIS — E1142 Type 2 diabetes mellitus with diabetic polyneuropathy: Secondary | ICD-10-CM | POA: Diagnosis not present

## 2021-03-30 NOTE — Unmapped (Signed)
[facility] Pharm[facility] Refill Coordination Note    Specialty Medication(s) to be Shipped:   Neurology: [name]    Other medication(s) to be shipped: No additional medications requested for fill at this time     Gary Rogers, DOB: [birth year]  Phone: [phone] (home)       All above HIPAA information was verified with patient.     Was a Nurse, learning disability used for this call? No    Completed refill call assessment today to schedule patient's medication shipment from the [facility] [phone]).  All relevant notes have been reviewed.     Specialty medication(s) and dose(s) confirmed: Regimen is correct and unchanged.   Changes to medications: Gary Rogers reports no changes at this time.  Changes to insurance: No  New side effects reported not previously addressed with a pharmacist or physician: None reported  Questions for the pharmacist: No    Confirmed patient received a Conservation officer, historic buildings and a Surveyor, mining with first shipment. The patient will receive a drug information handout for each medication shipped and additional FDA Medication Guides as required.       DISEASE/MEDICATION-SPECIFIC INFORMATION        N/A    SPECIALTY MEDICATION ADHERENCE     Medication Adherence    Patient reported X missed doses in the last month: 0  Specialty Medication: Austedo  Patient is on additional specialty medications: No  Patient is on more than two specialty medications: No  Any gaps in refill history greater than 2 weeks in the last 3 months: no  Demonstrates understanding of importance of adherence: yes  Informant: patient              Were doses missed due to medication being on hold? No    Austedo 6mg : Patient has 14 days of medication on hand    REFERRAL TO PHARMACIST     Referral to the pharmacist: Not needed      [facility]     Shipping address confirmed in Epic.     Delivery Scheduled: Yes, Expected medication delivery date: 12/16.     Medication will be delivered via UPS to the prescription address in Epic WAM.    Gary Rogers   [facility] Specialty Technician

## 2021-04-06 MED FILL — AUSTEDO 6 MG TABLET: ORAL | 30 days supply | Qty: 60 | Fill #2

## 2021-04-25 DIAGNOSIS — H1189 Other specified disorders of conjunctiva: Secondary | ICD-10-CM | POA: Diagnosis not present

## 2021-04-28 DIAGNOSIS — E1169 Type 2 diabetes mellitus with other specified complication: Secondary | ICD-10-CM | POA: Diagnosis not present

## 2021-04-28 DIAGNOSIS — E782 Mixed hyperlipidemia: Secondary | ICD-10-CM | POA: Diagnosis not present

## 2021-04-28 DIAGNOSIS — E039 Hypothyroidism, unspecified: Secondary | ICD-10-CM | POA: Diagnosis not present

## 2021-04-28 DIAGNOSIS — N189 Chronic kidney disease, unspecified: Secondary | ICD-10-CM | POA: Diagnosis not present

## 2021-04-28 DIAGNOSIS — N183 Chronic kidney disease, stage 3 unspecified: Secondary | ICD-10-CM | POA: Diagnosis not present

## 2021-04-28 NOTE — Unmapped (Signed)
[facility] Special[facility] Pharm[facility] Refill Coordination Note    Specialty Medication(s) to be Shipped:   Neurology: [name]    Other medication(s) to be shipped: No additional medications requested for fill at this time     Gary Rogers, DOB: January 15, 1956  Phone: [phone] (home)       All above HIPAA information was verified with patient.     Was a Nurse, learning disability used for this call? No    Completed refill call assessment today to schedule patient's medication shipment from the [facility] [phone]).  All relevant notes have been reviewed.     Specialty medication(s) and dose(s) confirmed: Regimen is correct and unchanged.   Changes to medications: [name] reports no changes at this time.  Changes to insurance: No  New side effects reported not previously addressed with a pharmacist or physician: None reported  Questions for the pharmacist: No    Confirmed patient received a Conservation officer, historic buildings and a Surveyor, mining with first shipment. The patient will receive a drug information handout for each medication shipped and additional FDA Medication Guides as required.       DISEASE/MEDICATION-SPECIFIC INFORMATION        N/A    SPECIALTY MEDICATION ADHERENCE     Medication Adherence    Patient reported X missed doses in the last month: 0  Specialty Medication: Austedo  Patient is on additional specialty medications: No  Patient is on more than two specialty medications: No  Any gaps in refill history greater than 2 weeks in the last 3 months: no  Demonstrates understanding of importance of adherence: yes  Informant: patient              Were doses missed due to medication being on hold? No    Austedo 6mg : Patient has 10 days of medication on hand    REFERRAL TO PHARMACIST     Referral to the pharmacist: Not needed      [facility]     Shipping address confirmed in Epic.     Delivery Scheduled: Yes, Expected medication delivery date: 1/13.     Medication will be delivered via UPS to the prescription address in Epic WAM.    [name]   [facility] Specialty Technician

## 2021-05-02 DIAGNOSIS — Z940 Kidney transplant status: Secondary | ICD-10-CM | POA: Diagnosis not present

## 2021-05-02 DIAGNOSIS — D509 Iron deficiency anemia, unspecified: Secondary | ICD-10-CM | POA: Diagnosis not present

## 2021-05-02 DIAGNOSIS — F5221 Male erectile disorder: Secondary | ICD-10-CM | POA: Diagnosis not present

## 2021-05-02 DIAGNOSIS — I10 Essential (primary) hypertension: Secondary | ICD-10-CM | POA: Diagnosis not present

## 2021-05-02 DIAGNOSIS — R809 Proteinuria, unspecified: Secondary | ICD-10-CM | POA: Diagnosis not present

## 2021-05-02 DIAGNOSIS — E1169 Type 2 diabetes mellitus with other specified complication: Secondary | ICD-10-CM | POA: Diagnosis not present

## 2021-05-02 DIAGNOSIS — G255 Other chorea: Secondary | ICD-10-CM | POA: Diagnosis not present

## 2021-05-02 DIAGNOSIS — E875 Hyperkalemia: Secondary | ICD-10-CM | POA: Diagnosis not present

## 2021-05-02 DIAGNOSIS — E039 Hypothyroidism, unspecified: Secondary | ICD-10-CM | POA: Diagnosis not present

## 2021-05-02 DIAGNOSIS — N1831 Chronic kidney disease, stage 3a: Secondary | ICD-10-CM | POA: Diagnosis not present

## 2021-05-02 DIAGNOSIS — E782 Mixed hyperlipidemia: Secondary | ICD-10-CM | POA: Diagnosis not present

## 2021-05-02 DIAGNOSIS — Z8619 Personal history of other infectious and parasitic diseases: Secondary | ICD-10-CM | POA: Diagnosis not present

## 2021-05-04 MED FILL — AUSTEDO 6 MG TABLET: ORAL | 30 days supply | Qty: 60 | Fill #3

## 2021-05-26 DIAGNOSIS — Z940 Kidney transplant status: Secondary | ICD-10-CM | POA: Diagnosis not present

## 2021-05-26 NOTE — Unmapped (Signed)
[facility] Refill Coordination Note    Specialty Medication(s) to be Shipped:   Neurology: [name]    Other medication(s) to be shipped: No additional medications requested for fill at this time     Wisdom Rickey, DOB: 06-Jul-1955  Phone: [phone] (home)       All above HIPAA information was verified with patient.     Was a Nurse, learning disability used for this call? No    Completed refill call assessment today to schedule patient's medication shipment from the [facility] [phone]).  All relevant notes have been reviewed.     Specialty medication(s) and dose(s) confirmed: Regimen is correct and unchanged.   Changes to medications: [place] reports no changes at this time.  Changes to insurance: No  New side effects reported not previously addressed with a pharmacist or physician: None reported  Questions for the pharmacist: No    Confirmed patient received a Conservation officer, historic buildings and a Surveyor, mining with first shipment. The patient will receive a drug information handout for each medication shipped and additional FDA Medication Guides as required.       DISEASE/MEDICATION-SPECIFIC INFORMATION        N/A    SPECIALTY MEDICATION ADHERENCE     Medication Adherence    Patient reported X missed doses in the last month: 0  Specialty Medication: Austedo  Patient is on additional specialty medications: No  Patient is on more than two specialty medications: No  Any gaps in refill history greater than 2 weeks in the last 3 months: no  Demonstrates understanding of importance of adherence: yes  Informant: patient              Were doses missed due to medication being on hold? No    Austedo 6mg : Patient has 14 days of medication on hand    REFERRAL TO PHARMACIST     Referral to the pharmacist: Not needed      [facility]     Shipping address confirmed in Epic.     Delivery Scheduled: Yes, Expected medication delivery date: 2/14.     Medication will be delivered via UPS to the prescription address in Epic WAM.    [name]   [facility] Specialty Technician

## 2021-05-29 DIAGNOSIS — Z20822 Contact with and (suspected) exposure to covid-19: Secondary | ICD-10-CM | POA: Diagnosis not present

## 2021-06-05 MED FILL — AUSTEDO 6 MG TABLET: ORAL | 30 days supply | Qty: 60 | Fill #4

## 2021-06-06 DIAGNOSIS — E1142 Type 2 diabetes mellitus with diabetic polyneuropathy: Secondary | ICD-10-CM | POA: Diagnosis not present

## 2021-06-06 DIAGNOSIS — B351 Tinea unguium: Secondary | ICD-10-CM | POA: Diagnosis not present

## 2021-06-27 NOTE — Unmapped (Signed)
[facility] Refill Coordination Note    Specialty Medication(s) to be Shipped:   Neurology: Gary Rogers    Other medication(s) to be shipped: No additional medications requested for fill at this time     Gary Rogers, DOB: 12/17/1955  Phone: [phone] (home)       All above HIPAA information was verified with patient.     Was a Nurse, learning disability used for this call? No    Completed refill call assessment today to schedule patient's medication shipment from the [facility] [phone]).  All relevant notes have been reviewed.     Specialty medication(s) and dose(s) confirmed: Regimen is correct and unchanged.   Changes to medications: Gary Rogers reports no changes at this time.  Changes to insurance: No  New side effects reported not previously addressed with a pharmacist or physician: None reported  Questions for the pharmacist: No    Confirmed patient received a Conservation officer, historic buildings and a Surveyor, mining with first shipment. The patient will receive a drug information handout for each medication shipped and additional FDA Medication Guides as required.       DISEASE/MEDICATION-SPECIFIC INFORMATION        N/A    SPECIALTY MEDICATION ADHERENCE     Medication Adherence    Patient reported X missed doses in the last month: 0  Specialty Medication: Austedo 6mg   Patient is on additional specialty medications: No  Patient is on more than two specialty medications: No  Any gaps in refill history greater than 2 weeks in the last 3 months: no  Demonstrates understanding of importance of adherence: yes              Were doses missed due to medication being on hold? No  AUSTEDO 6 mg Tab - Patient has  14 day supply on hand      REFERRAL TO PHARMACIST     Referral to the pharmacist: Not needed      [facility]     Shipping address confirmed in Epic.     Delivery Scheduled: Yes, Expected medication delivery date: 07/04/21 .     Medication will be delivered via UPS to the prescription address in Epic WAM.    Gary Rogers   [facility] Specialty Technician

## 2021-06-28 DIAGNOSIS — Z20822 Contact with and (suspected) exposure to covid-19: Secondary | ICD-10-CM | POA: Diagnosis not present

## 2021-07-03 MED FILL — AUSTEDO 6 MG TABLET: ORAL | 30 days supply | Qty: 60 | Fill #5

## 2021-07-06 DIAGNOSIS — Z20828 Contact with and (suspected) exposure to other viral communicable diseases: Secondary | ICD-10-CM | POA: Diagnosis not present

## 2021-07-07 DIAGNOSIS — Z20822 Contact with and (suspected) exposure to covid-19: Secondary | ICD-10-CM | POA: Diagnosis not present

## 2021-07-21 ENCOUNTER — Other Ambulatory Visit (HOSPITAL_COMMUNITY): Payer: Self-pay | Admitting: Family Medicine

## 2021-07-21 ENCOUNTER — Ambulatory Visit (HOSPITAL_COMMUNITY)
Admission: RE | Admit: 2021-07-21 | Discharge: 2021-07-21 | Disposition: A | Discharge status: Home / Self Care | Payer: Medicare Other | Source: Ambulatory Visit | Attending: Family Medicine | Admitting: Family Medicine

## 2021-07-21 ENCOUNTER — Other Ambulatory Visit: Payer: Self-pay | Admitting: Family Medicine

## 2021-07-21 DIAGNOSIS — R296 Repeated falls: Secondary | ICD-10-CM | POA: Diagnosis not present

## 2021-07-21 DIAGNOSIS — E1165 Type 2 diabetes mellitus with hyperglycemia: Secondary | ICD-10-CM | POA: Diagnosis not present

## 2021-07-21 DIAGNOSIS — E039 Hypothyroidism, unspecified: Secondary | ICD-10-CM | POA: Diagnosis not present

## 2021-07-21 DIAGNOSIS — R410 Disorientation, unspecified: Secondary | ICD-10-CM | POA: Diagnosis not present

## 2021-07-21 DIAGNOSIS — I6381 Other cerebral infarction due to occlusion or stenosis of small artery: Secondary | ICD-10-CM | POA: Diagnosis not present

## 2021-07-21 DIAGNOSIS — G10 Huntington's disease: Secondary | ICD-10-CM | POA: Diagnosis not present

## 2021-07-21 DIAGNOSIS — S0990XA Unspecified injury of head, initial encounter: Secondary | ICD-10-CM | POA: Diagnosis not present

## 2021-07-21 DIAGNOSIS — I6782 Cerebral ischemia: Secondary | ICD-10-CM | POA: Diagnosis not present

## 2021-07-22 DIAGNOSIS — Z20822 Contact with and (suspected) exposure to covid-19: Secondary | ICD-10-CM | POA: Diagnosis not present

## 2021-07-24 DIAGNOSIS — N189 Chronic kidney disease, unspecified: Secondary | ICD-10-CM | POA: Diagnosis not present

## 2021-07-24 DIAGNOSIS — Z940 Kidney transplant status: Secondary | ICD-10-CM | POA: Diagnosis not present

## 2021-07-25 DIAGNOSIS — N179 Acute kidney failure, unspecified: Secondary | ICD-10-CM | POA: Diagnosis not present

## 2021-07-25 DIAGNOSIS — Z940 Kidney transplant status: Secondary | ICD-10-CM | POA: Diagnosis not present

## 2021-07-26 DIAGNOSIS — E782 Mixed hyperlipidemia: Secondary | ICD-10-CM | POA: Diagnosis not present

## 2021-07-26 DIAGNOSIS — E039 Hypothyroidism, unspecified: Secondary | ICD-10-CM | POA: Diagnosis not present

## 2021-07-26 DIAGNOSIS — E1169 Type 2 diabetes mellitus with other specified complication: Secondary | ICD-10-CM | POA: Diagnosis not present

## 2021-07-26 DIAGNOSIS — Z125 Encounter for screening for malignant neoplasm of prostate: Secondary | ICD-10-CM | POA: Diagnosis not present

## 2021-07-31 NOTE — Unmapped (Signed)
[facility] Pharm[facility] Refill Coordination Note    Specialty Medication(s) to be Shipped:   Neurology: Gary Rogers    Other medication(s) to be shipped: No additional medications requested for fill at this time     Gary Rogers, DOB: 07-31-1955  Phone: [phone] (home)       All above HIPAA information was verified with patient.     Was a Nurse, learning disability used for this call? No    Completed refill call assessment today to schedule patient's medication shipment from the [facility] [phone]).  All relevant notes have been reviewed.     Specialty medication(s) and dose(s) confirmed: Regimen is correct and unchanged.   Changes to medications: Gary Rogers reports no changes at this time.  Changes to insurance: No  New side effects reported not previously addressed with a pharmacist or physician: None reported  Questions for the pharmacist: No    Confirmed patient received a Conservation officer, historic buildings and a Surveyor, mining with first shipment. The patient will receive a drug information handout for each medication shipped and additional FDA Medication Guides as required.       DISEASE/MEDICATION-SPECIFIC INFORMATION        N/A    SPECIALTY MEDICATION ADHERENCE     Medication Adherence    Patient reported X missed doses in the last month: 0  Specialty Medication: AUSTEDO 6 mg  Patient is on additional specialty medications: No  Patient is on more than two specialty medications: No  Any gaps in refill history greater than 2 weeks in the last 3 months: no  Demonstrates understanding of importance of adherence: yes              Were doses missed due to medication being on hold? No  AUSTEDO 6 mg Tab - Patient has  14 day supply on hand      REFERRAL TO PHARMACIST     Referral to the pharmacist: Not needed      [facility]     Shipping address confirmed in Epic.     Delivery Scheduled: Yes, Expected medication delivery date:  08/09/21 .     Medication will be delivered via UPS to the prescription address in Epic WAM.    Gary Rogers   [facility] Specialty Technician

## 2021-08-03 DIAGNOSIS — Z20822 Contact with and (suspected) exposure to covid-19: Secondary | ICD-10-CM | POA: Diagnosis not present

## 2021-08-06 DIAGNOSIS — Z20822 Contact with and (suspected) exposure to covid-19: Secondary | ICD-10-CM | POA: Diagnosis not present

## 2021-08-08 DIAGNOSIS — Z20822 Contact with and (suspected) exposure to covid-19: Secondary | ICD-10-CM | POA: Diagnosis not present

## 2021-08-08 DIAGNOSIS — C6902 Malignant neoplasm of left conjunctiva: Secondary | ICD-10-CM | POA: Diagnosis not present

## 2021-08-08 MED FILL — AUSTEDO 6 MG TABLET: ORAL | 30 days supply | Qty: 60 | Fill #6

## 2021-08-14 DIAGNOSIS — N179 Acute kidney failure, unspecified: Secondary | ICD-10-CM | POA: Diagnosis not present

## 2021-08-15 DIAGNOSIS — E1142 Type 2 diabetes mellitus with diabetic polyneuropathy: Secondary | ICD-10-CM | POA: Diagnosis not present

## 2021-08-15 DIAGNOSIS — B351 Tinea unguium: Secondary | ICD-10-CM | POA: Diagnosis not present

## 2021-08-16 DIAGNOSIS — Z20822 Contact with and (suspected) exposure to covid-19: Secondary | ICD-10-CM | POA: Diagnosis not present

## 2021-08-17 IMAGING — DX DG CHEST 1V PORT
1 series · 1 of 1 positions shown · non-contrast
Comparison: None.

CLINICAL DATA: Dyspnea

EXAM:
PORTABLE CHEST 1 VIEW

[chest]
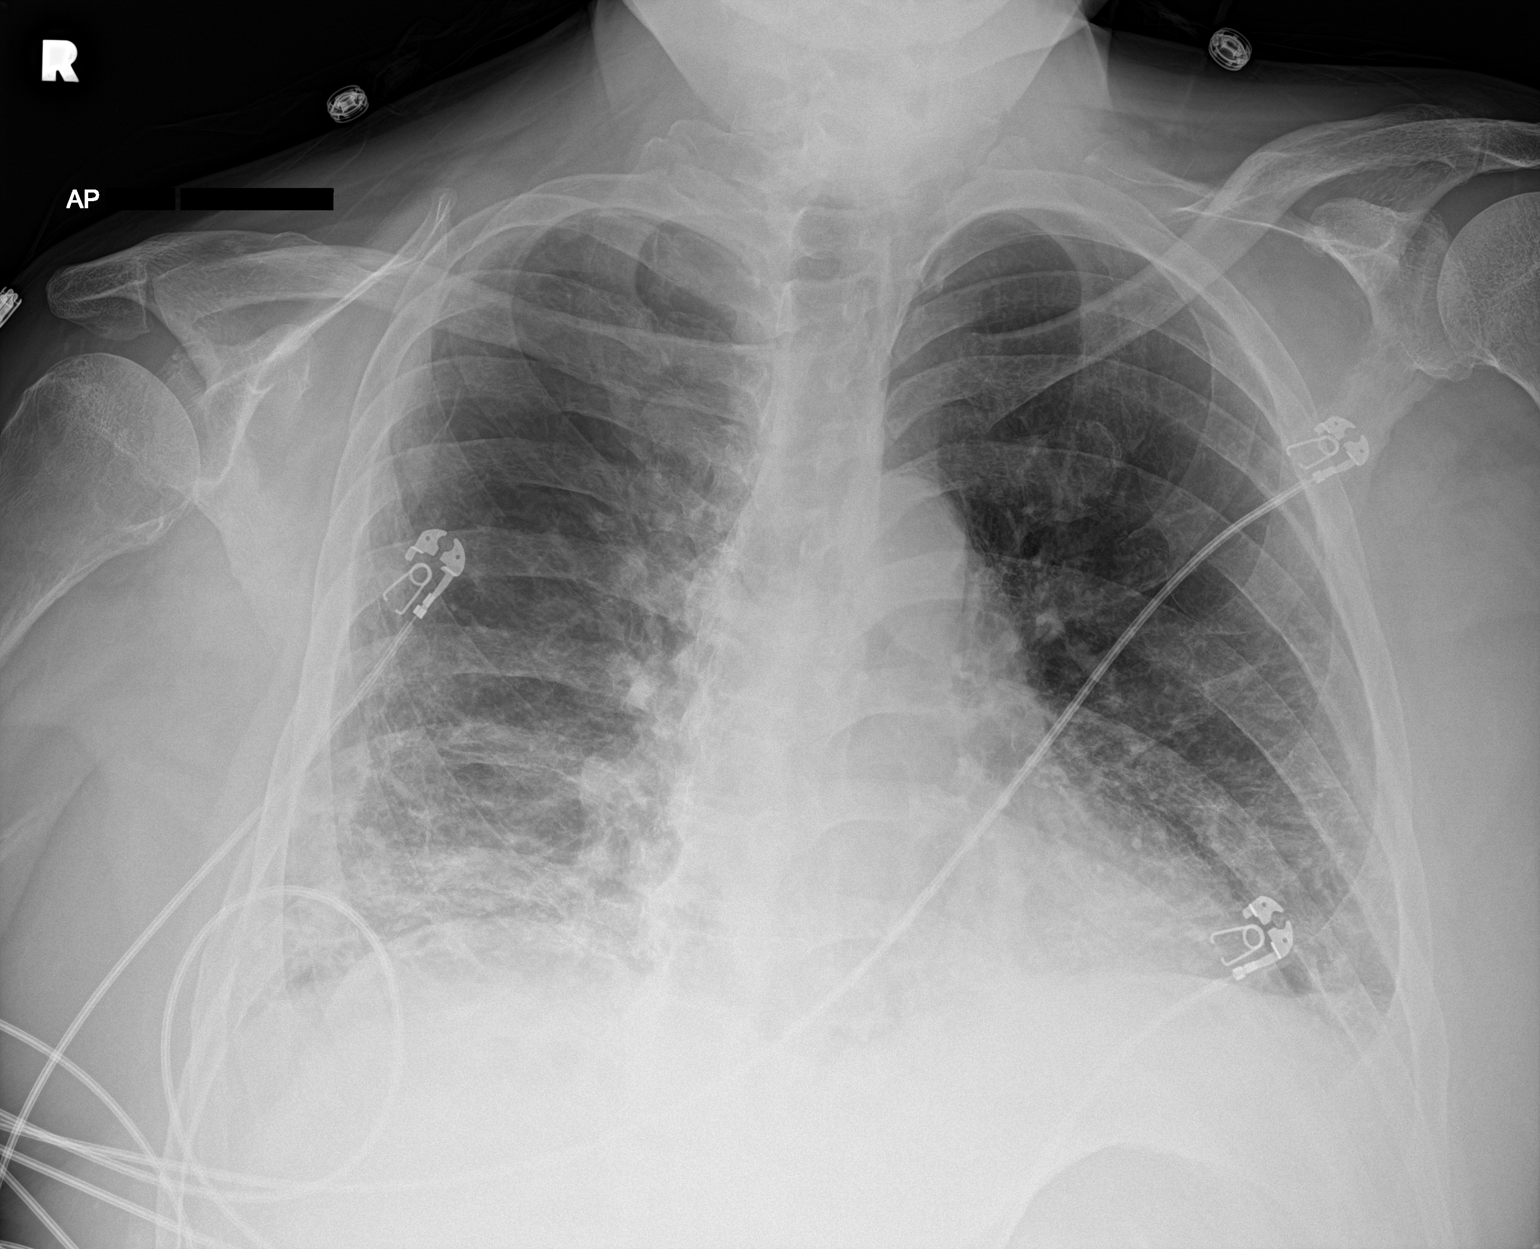

[1 of 1 positions shown; findings below may reference images not displayed]

FINDINGS: The heart size and mediastinal contours are within normal limits.
Interstitial and irregular opacity of the bilateral lung bases. The
visualized skeletal structures are unremarkable.
IMPRESSION: Interstitial and irregular opacity of the bilateral lung bases,
which may reflect edema or infection. There may be a component of
underlying scarring or fibrosis.

## 2021-08-20 IMAGING — CT CT ANGIO CHEST
1 of 2 series · 14 of 32 positions shown · IV contrast (OMNIPAQUE)
Comparison: None.

CLINICAL DATA: Respiratory failure.

EXAM:
CT ANGIOGRAPHY CHEST WITH CONTRAST
TECHNIQUE: Multidetector CT imaging of the chest was performed using the
standard protocol during bolus administration of intravenous
contrast. Multiplanar CT image reconstructions and MIPs were
obtained to evaluate the vascular anatomy.
CONTRAST:  75mL OMNIPAQUE IOHEXOL 350 MG/ML SOLN

[Series 4: pe chest · axial · 0.91mm/px · z∈[+870,+1142]mm · 14 of 162 slices shown]
[im 13/162  lung]
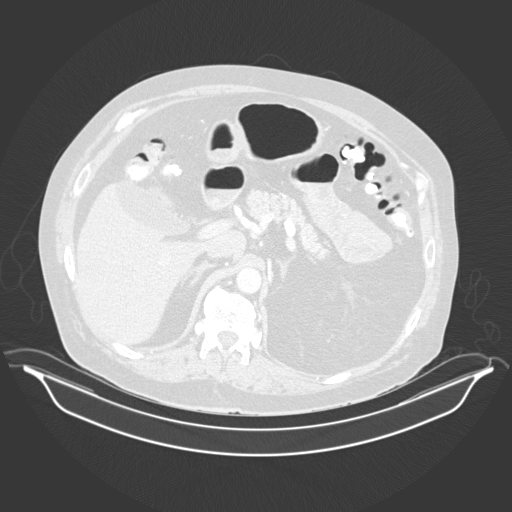
[im 25/162  mediastinal]
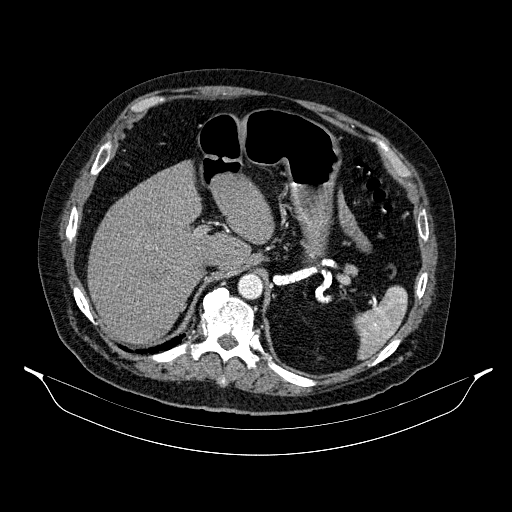
[im 38/162  lung]
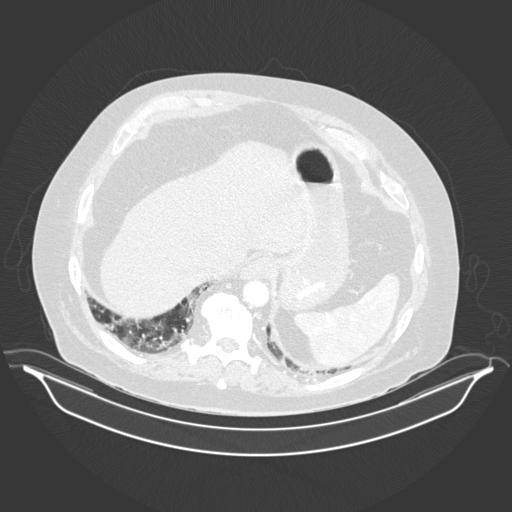
[im 50/162  mediastinal]
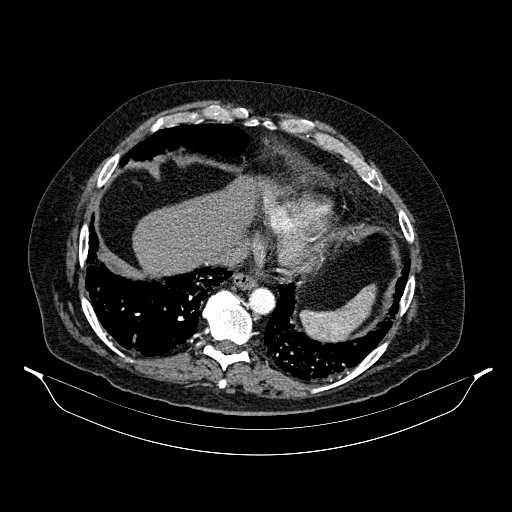
[im 62/162  lung]
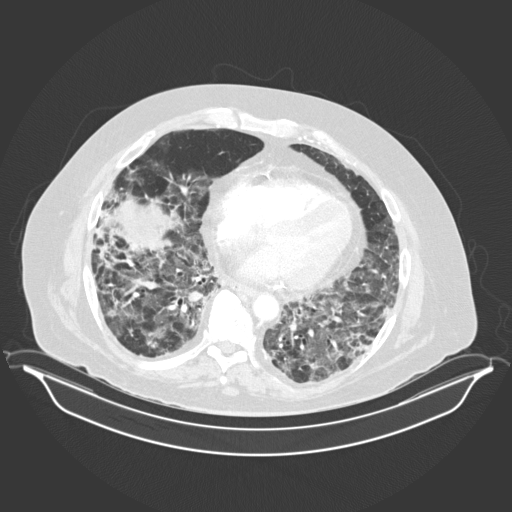
[im 75/162  mediastinal]
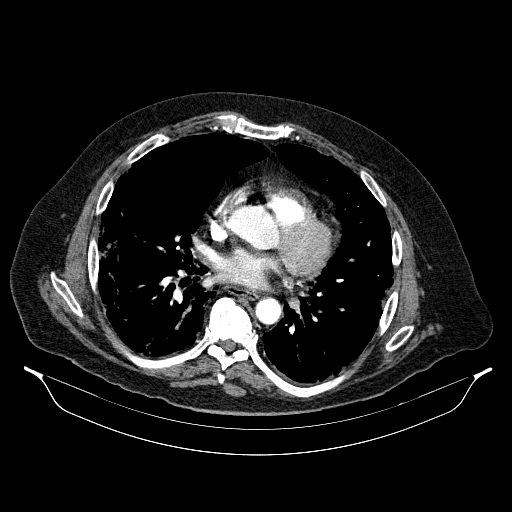
[im 77/162  lung]
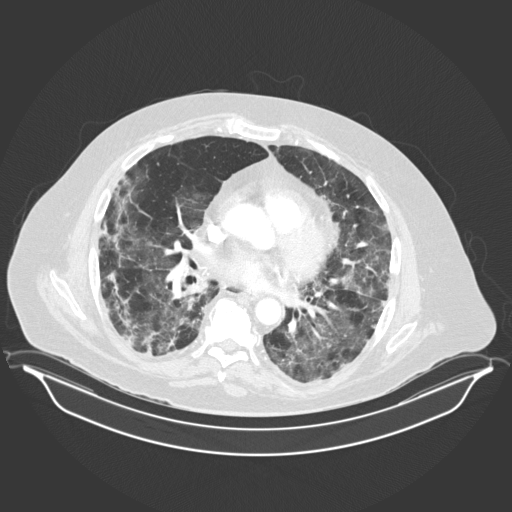
[im 81/162  mediastinal]
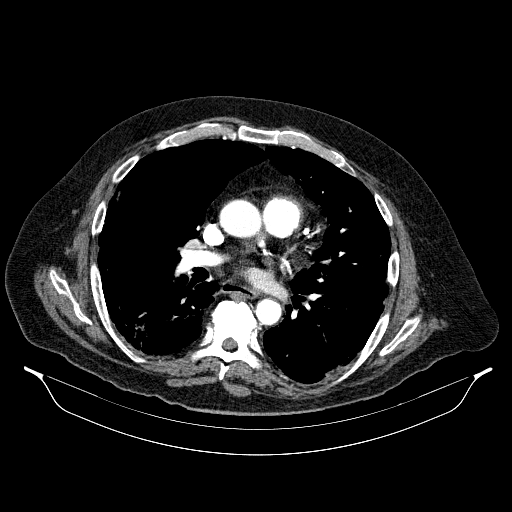
[im 87/162  lung]
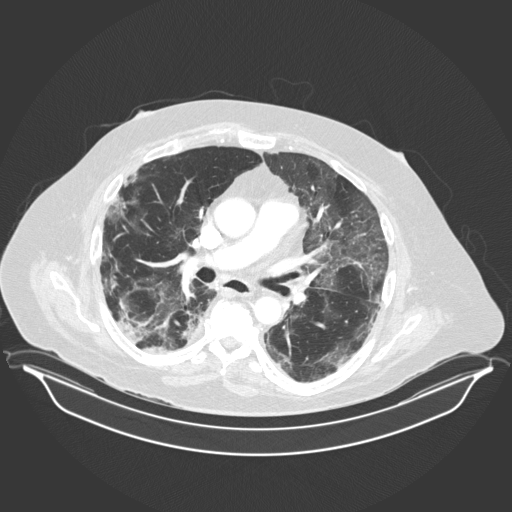
[im 100/162  mediastinal]
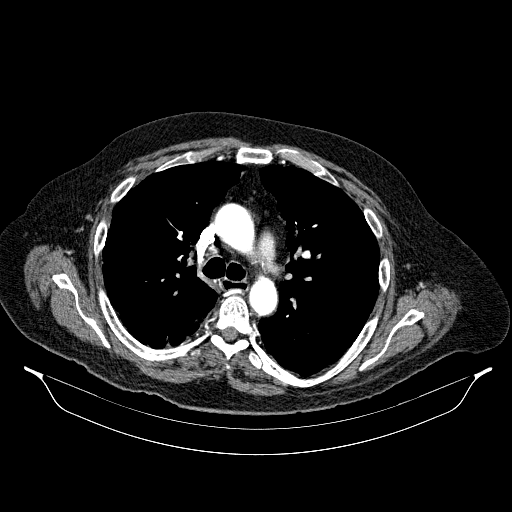
[im 112/162  lung]
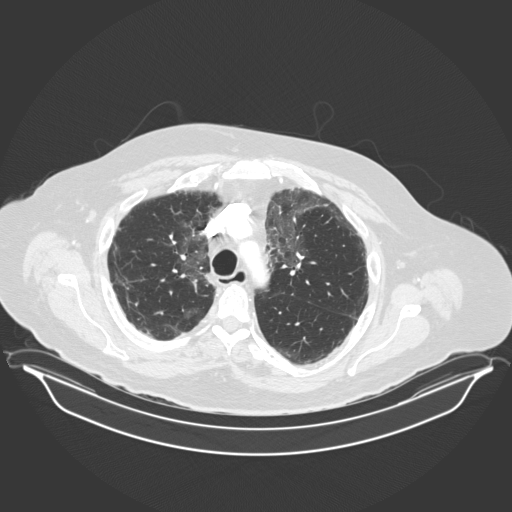
[im 124/162  mediastinal]
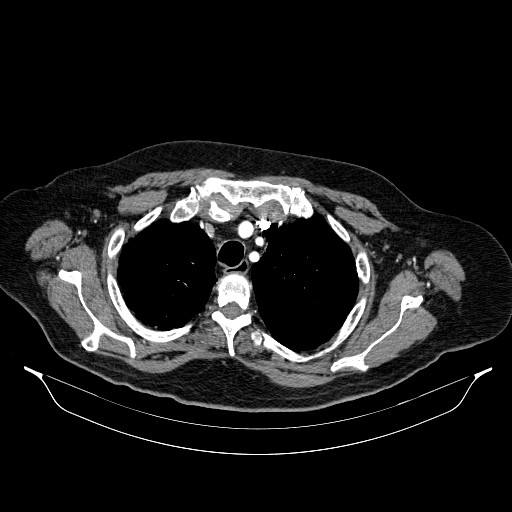
[im 137/162  lung]
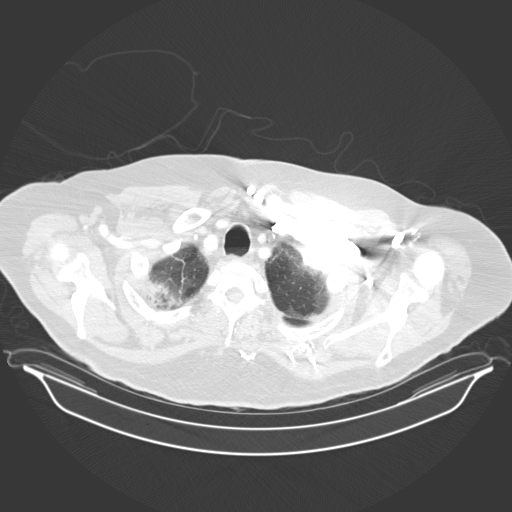
[im 149/162  mediastinal]
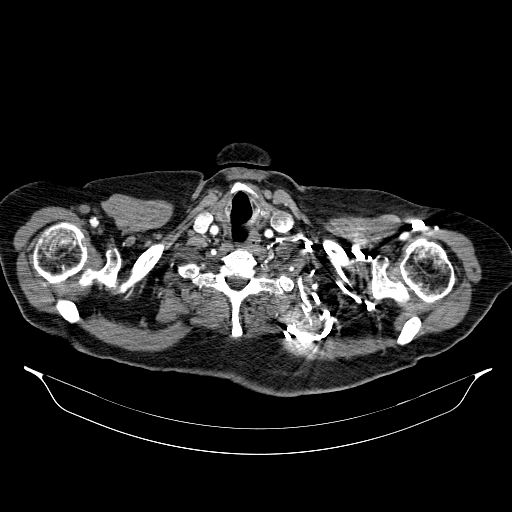

[14 of 32 positions shown; findings below may reference images not displayed]

FINDINGS: Cardiovascular: Satisfactory opacification of the pulmonary arteries
to the segmental level. No evidence of pulmonary embolism. Normal
heart size. No pericardial effusion. Coronary artery calcifications
are noted.

Mediastinum/Nodes: No enlarged mediastinal, hilar, or axillary lymph
nodes. Thyroid gland, trachea, and esophagus demonstrate no
significant findings.

Lungs/Pleura: No pneumothorax or pleural effusion is noted. Multiple
airspace opacities are noted throughout both lungs consistent with
multifocal pneumonia, potentially of viral etiology.

Upper Abdomen: No acute abnormality.

Musculoskeletal: No chest wall abnormality. No acute or significant
osseous findings.

Review of the MIP images confirms the above findings.
IMPRESSION: 1. No definite evidence of pulmonary embolus.
2. Coronary artery calcifications are noted suggesting coronary
artery disease.
3. Multiple airspace opacities are noted throughout both lungs
consistent with multifocal pneumonia, potentially of viral etiology.

## 2021-08-21 DIAGNOSIS — Z20822 Contact with and (suspected) exposure to covid-19: Secondary | ICD-10-CM | POA: Diagnosis not present

## 2021-08-25 DIAGNOSIS — E875 Hyperkalemia: Secondary | ICD-10-CM | POA: Diagnosis not present

## 2021-08-25 DIAGNOSIS — I10 Essential (primary) hypertension: Secondary | ICD-10-CM | POA: Diagnosis not present

## 2021-08-25 DIAGNOSIS — E782 Mixed hyperlipidemia: Secondary | ICD-10-CM | POA: Diagnosis not present

## 2021-08-25 DIAGNOSIS — F5221 Male erectile disorder: Secondary | ICD-10-CM | POA: Diagnosis not present

## 2021-08-25 DIAGNOSIS — E1169 Type 2 diabetes mellitus with other specified complication: Secondary | ICD-10-CM | POA: Diagnosis not present

## 2021-08-25 DIAGNOSIS — Z940 Kidney transplant status: Secondary | ICD-10-CM | POA: Diagnosis not present

## 2021-08-25 DIAGNOSIS — G255 Other chorea: Secondary | ICD-10-CM | POA: Diagnosis not present

## 2021-08-25 DIAGNOSIS — Z8619 Personal history of other infectious and parasitic diseases: Secondary | ICD-10-CM | POA: Diagnosis not present

## 2021-08-25 DIAGNOSIS — E039 Hypothyroidism, unspecified: Secondary | ICD-10-CM | POA: Diagnosis not present

## 2021-08-25 DIAGNOSIS — D509 Iron deficiency anemia, unspecified: Secondary | ICD-10-CM | POA: Diagnosis not present

## 2021-08-25 DIAGNOSIS — N184 Chronic kidney disease, stage 4 (severe): Secondary | ICD-10-CM | POA: Diagnosis not present

## 2021-08-25 DIAGNOSIS — R809 Proteinuria, unspecified: Secondary | ICD-10-CM | POA: Diagnosis not present

## 2021-08-28 DIAGNOSIS — Z20822 Contact with and (suspected) exposure to covid-19: Secondary | ICD-10-CM | POA: Diagnosis not present

## 2021-08-29 DIAGNOSIS — Z20822 Contact with and (suspected) exposure to covid-19: Secondary | ICD-10-CM | POA: Diagnosis not present

## 2021-08-30 IMAGING — DX DG CHEST 1V PORT
1 series · 1 of 1 positions shown · non-contrast
Comparison: 05/31/2019 chest radiograph.

CLINICAL DATA: Acute respiratory failure with hypoxia. COVID 19
positive.

EXAM:
PORTABLE CHEST 1 VIEW

[chest]
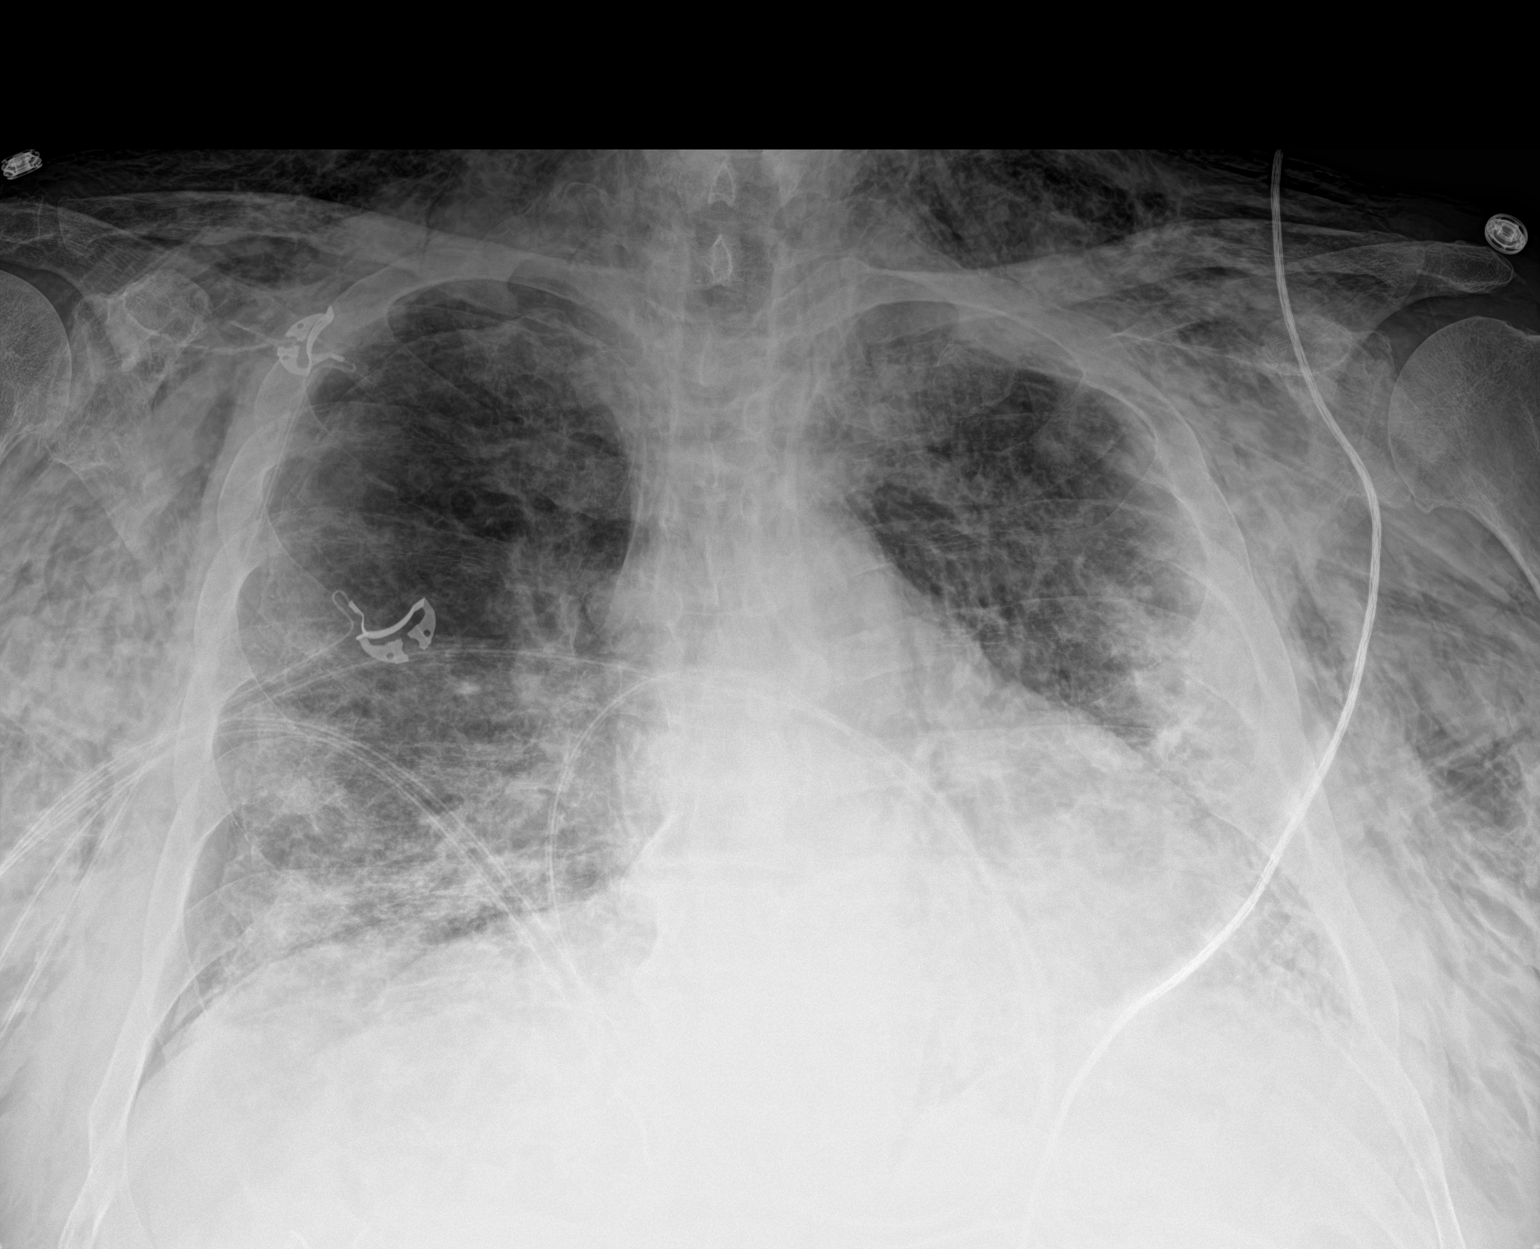

[1 of 1 positions shown; findings below may reference images not displayed]

FINDINGS: Extensive subcutaneous emphysema throughout bilateral chest wall and
lower neck, new. Stable cardiomediastinal silhouette with normal
heart size. New small to moderate right pneumothorax, approximately
10-15%. No mediastinal shift. No left pneumothorax. No pleural
effusion. Patchy opacities in the mid to lower lungs bilaterally
appear worsened.
IMPRESSION: 1. New small to moderate right pneumothorax, approximately 10-15%.
No mediastinal shift.
2. New extensive subcutaneous emphysema throughout bilateral chest
wall and lower neck.
3. Worsening patchy opacities in the mid to lower lungs bilaterally,
compatible with [id number]-J1 pneumonia.

Critical Value/emergent results were called by telephone at the time
of interpretation on 06/13/2019 at [DATE] to provider MUHAMMAD AQIL NEESA ,
who verbally acknowledged these results.

## 2021-08-31 DIAGNOSIS — Z940 Kidney transplant status: Secondary | ICD-10-CM | POA: Diagnosis not present

## 2021-09-01 DIAGNOSIS — G255 Other chorea: Principal | ICD-10-CM

## 2021-09-01 DIAGNOSIS — G2401 Drug induced subacute dyskinesia: Principal | ICD-10-CM

## 2021-09-01 MED ORDER — AUSTEDO 6 MG TABLET
ORAL_TABLET | Freq: Two times a day (BID) | ORAL | 6 refills | 30 days | Status: CP
Start: 2021-09-01 — End: ?
  Filled 2021-09-11: qty 60, 30d supply, fill #0

## 2021-09-01 NOTE — Unmapped (Signed)
Refill request received from patient.      Medication Requested:  deutetrabenazine (AUSTEDO) 6 mg Tab    Last Office Visit: 10/17/2020   Next Office Visit: 09/05/2021  Last Prescriber: Michell Heinrich, MD      Nurse refill requirements met? No  If not met, why: Can not refill per protocol     Sent to: Provider for signing  If sent to provider, which provider?: Michell Heinrich, MD

## 2021-09-04 NOTE — Unmapped (Signed)
[facility] Clinical Assessment & Refill Coordination Note    Gary Rogers, [place]: [birth year]  Phone: [phone] (home)     All above HIPAA information was verified with patient.     Was a Nurse, learning disability used for this call? No    Specialty Medication(s):   Neurology: [name]     Current Outpatient Medications   Medication Sig Dispense Refill    amLODIPine (NORVASC) 5 MG tablet Take 5 mg by mouth in the morning. (Patient not taking: Reported on 02/28/2021)      atorvastatin (LIPITOR) 10 MG tablet Take 10 mg by mouth daily.      CAPTOPRIL ORAL Take 6.25 mg by mouth two (2) times a day.      deutetrabenazine (AUSTEDO) 6 mg Tab Take 1 tablet (6 mg total) by mouth two (2) times a day. 60 tablet 6    dulaglutide 1.5 mg/0.5 mL PnIj Inject 3 mg under the skin every seven (7) days.      furosemide (LASIX) 20 MG tablet Take 20 mg by mouth daily.       insulin degludec 200 unit/mL (3 mL) InPn Inject under the skin daily.       levothyroxine (SYNTHROID, LEVOTHROID) 100 MCG tablet Take 100 mcg by mouth daily at 0600.      lisinopril (PRINIVIL,ZESTRIL) 10 MG tablet Take 10 mg by mouth nightly.  (Patient not taking: Reported on 02/28/2021)  3    mycophenolate (CELLCEPT) 500 mg tablet Take by mouth Two (2) times a day.      predniSONE (DELTASONE) 5 MG tablet Take 5 mg by mouth daily.      risperiDONE (RISPERDAL) 0.25 MG tablet Take 0.25 mg by mouth Two (2) times a day. Per patient      sodium zirconium cyclosilicate (LOKELMA) 10 gram PwPk packet Lokelma 10 gram oral powder packet   TAKE 1 PACKET 3 x day      tacrolimus (PROGRAF) 0.5 MG capsule Take 1.5 mg by mouth Two (2) times a day. TAKE 4 CAPSULES BY MOUTH TWICE DAILY       No current facility-administered medications for this visit.        Changes to medications: Gary Rogers reports no changes at this time.    No Known Allergies    Changes to allergies: No    SPECIALTY MEDICATION ADHERENCE     Austedo 6 mg: ~11 days of medicine on hand       Medication Adherence    Patient reported X missed doses in the last month: 0  Specialty Medication: Austedo  Patient is on additional specialty medications: No  Informant: patient          Specialty medication(s) dose(s) confirmed: Regimen is correct and unchanged.     Are there any concerns with adherence? No    Adherence counseling provided? Not needed    CLINICAL MANAGEMENT AND INTERVENTION      Clinical Benefit Assessment:    Do you feel the medicine is effective or helping your condition? Yes    Clinical Benefit counseling provided? Not needed    Adverse Effects Assessment:    Are you experiencing any side effects? No    Are you experiencing difficulty administering your medicine? No    Quality of Life Assessment:    Quality of Life    Rheumatology  Oncology  Dermatology  Cystic Fibrosis          How many days over the past month did your chorea/tardive  dyskinesia  keep you from your normal activities? For example, brushing your teeth or getting up in the morning. 0    Have you discussed this with your provider? Not needed    Acute Infection Status:    Acute infections noted within Epic:  No active infections  Patient reported infection: None    Therapy Appropriateness:    Is therapy appropriate and patient progressing towards therapeutic goals? Yes, therapy is appropriate and should be continued    DISEASE/MEDICATION-SPECIFIC INFORMATION      N/A    PATIENT SPECIFIC NEEDS     Does the patient have any physical, cognitive, or cultural barriers? No    Is the patient high risk? No    Does the patient require a Care Management Plan? No     SOCIAL DETERMINANTS OF HEALTH     At the [facility], we have learned that life circumstances - like trouble affording food, housing, utilities, or transportation can affect the health of many of our patients.   That is why we wanted to ask: are you currently experiencing any life circumstances that are negatively impacting your health and/or quality of life? Patient declined to answer    Social Determinants of Health     Financial Resource Strain: Not on file   Internet Connectivity: Not on file   Food Insecurity: Not on file   Tobacco Use: Not on file   Housing/Utilities: Unknown    Within the past 12 months, have you ever stayed: outside, in a car, in a tent, in an overnight shelter, or temporarily in someone else's home (i.e. couch-surfing)?: No    Are you worried about losing your housing?: Not on file    Within the past 12 months, have you been unable to get utilities (heat, electricity) when it was really needed?: Not on file   Alcohol Use: Not on file   Transportation Needs: Not on file   Substance Use: Not on file   Health Literacy: Not on file   Physical Activity: Not on file   Interpersonal Safety: Not on file   Stress: Not on file   Intimate Partner Violence: Not on file   Depression: Not on file   Social Connections: Not on file       Would you be willing to receive help with any of the needs that you have identified today? Not applicable       SHIPPING     Specialty Medication(s) to be Shipped:   Neurology: [name]    Other medication(s) to be shipped: No additional medications requested for fill at this time     Changes to insurance: No    Delivery Scheduled: Yes, Expected medication delivery date: 09/12/21.     Medication will be delivered via UPS to the confirmed prescription address in [facility].    The patient will receive a drug information handout for each medication shipped and additional FDA Medication Guides as required.  Verified that patient has previously received a Conservation officer, historic buildings and a Surveyor, mining.    The patient or caregiver noted above participated in the development of this care plan and knows that they can request review of or adjustments to the care plan at any time.      All of the patient's questions and concerns have been addressed.    Gary Rogers   [facility] Specialty Pharmacist

## 2021-09-05 ENCOUNTER — Ambulatory Visit: Admit: 2021-09-05 | Discharge: 2021-09-06 | Payer: MEDICARE | Attending: Family | Primary: Family

## 2021-09-05 DIAGNOSIS — G255 Other chorea: Secondary | ICD-10-CM | POA: Diagnosis not present

## 2021-09-05 DIAGNOSIS — R293 Abnormal posture: Secondary | ICD-10-CM | POA: Diagnosis not present

## 2021-09-05 DIAGNOSIS — R197 Diarrhea, unspecified: Secondary | ICD-10-CM | POA: Diagnosis not present

## 2021-09-05 DIAGNOSIS — Z5181 Encounter for therapeutic drug level monitoring: Secondary | ICD-10-CM | POA: Diagnosis not present

## 2021-09-05 NOTE — Unmapped (Signed)
FAX CONFIRMATION     Orders/documents that were faxed: PT Referral  Facility/location it was faxed to: [facility]  Phone: [phone]  Fax: [phone]  Time sent: [phone]  Fax confirmation received and document shredded

## 2021-09-05 NOTE — Unmapped (Addendum)
[facility] [address] [place]  [address]  [place] [address]  [phone]    Date: 09/05/2021  Patient Name: Gary Rogers  MRN: [id number]  PCP: Darlina Guys Jeanice Lim, Darlina Guys, MD    Assessment:      Mr. Gary Rogers is a 66 y.o. male who  has a past medical history of Anxiety, Chorea, Chronic kidney disease, Depression, Diabetes mellitus, type 2 (CMS-HCC), Hyperlipidemia, Hypertension, and Hyperthyroidism. presenting in follow up for evaluation of mostly generalized, asymmetric, adventitious movements sparing the upper face (left greater than right), historically beginning in a rather acute/subacute fashion in 2009 when he apparently had very poorly controlled blood sugars and development/appreciation of chronic kidney disease requiring renal transplant, with subsequent improvement of his chorea both subjectively and apparently objectively through follow-up clinic notes from the [place] [facility] [place] movement disorder group initially after performing renal transplant, subsequently with introduction of neuroleptics and most recently dopamine depletors, likely etiology of 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    1. 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 He develops parakinesias during examination, with a nonstereoptyped movement that flows from joint to joint, matching with suspected choreiform disorder. Some of his speech difficulties could be apraxic in nature versus ataxic in nature should he have had any involvement of his brainstem/cerebellum with the underlying toxic/metabolic process, but may just as well be related to lower facial chorea. I might argue the latter case after seeing his lower facial dyskinesias more vividly in the past off of therapy.  Patient historically has benefited from tetrabenazine with more evident EPS symptoms that were even bothering the patient for which reason we discontinued therapy.  Deutetrabenazine has been well-tolerated with what appear to be minimal EPS symptoms which are not bothering the patient, and overall feels as though his choreiform movement is well controlled.    Since last visit patient has been having some difficulty with chronic health issues. His kidney function is worse, he had some blood pressure difficulties and I suspect continues to have this due to his description of lightheadedness with standing and walking. He currently is struggling with his TSH and reports he has had diarrhea for over a month with some incontinence with this due the urgency. Due to these health concerns which I think are adding to his decline in mobility and increase in chorea, we will continue current dose of Austedo unchanged, though certainly could consider augmentation in the future since he is taking quite low doses at the moment.     Today on exam he does have EPS symptoms, left more so than right. He does have some mild postural instability when pulled. He demonstrated mild overshooting with finger chase.    Vitals today did show over a 20 point systolic bp drop with standing and 26 point increase in heart rate possible showing dehydration. Educated on increased water intake and conservative measure of OH.     Advised on fall precautions and referred to PT [facility]svt big.     Patient is to follow up as soon as  possible with primary care for diarrhea.     I personally spent 65 minutes face-to-face and non-face-to-face in the care of this patient, which includes all pre, intra, and post visit time on the date of service.  All documented time was specific to the E/M visit and does not include any procedures that may have been performed.       Plan:      Follow up with your primary care for the diarrhea. Diarrhea for a month is unusual. Lease call and get in as soon as possible. Be sure to follow up with the tsh.   I placed a referral to PT.   Please drink 2 liters of water a day.   Follow up 1-2 months.      Subjective:      HPI: Patient is a 66 y.o. left-handed Caucasian male with past medical history of chronic kidney disease status post renal transplant in 2010, diabetes mellitus previously quite poorly controlled, who presents for follow up of adventitious movements that have been present throughout the body since approximately 2009. These were felt to be toxic-metabolic induced choreaform movements. Please review my initial clinic visit note dated on 09/29.2016 for more information regarding history of present illness.    Interim history: Patient presents today with wife. Since march they have noticed he is stiffer, having difficulty with balance, they went out to visit their granddaughter and had a fall. He feels unsteady.     His TSH was elevated also, he following up with his primary care may 30th.   His mother is concerned his movement has gotten worse and his wife also notes it it worse but harder for her to tell because she with him all the time.     In January he had a little confusion and they did have a CT which was negative. He was taking a little to much blood pressure medicine which was started in January, the confusion prompted them to decrease his bp medication.     They have been having difficulty managing his blood sugar. His kidney function has gotten a lot worse.     He still gets dizzy (he gets lightheaded and feels like he is going to pass out) with standing and walking sometimes. He drinks 60 ounces water a day, this varies though.     He is having lots of diarrhea for the last month, it is very runny and he is having accidents. He knows he will have to go but he wont be able to get to the toilet in time.     CHOREA/DYSKINESIA REGIMEN:  Austedo 6 mg 1 tablet twice a day    On review of systems, the patient denies hallucinations, impulsivity, irritability, current memory deficits.  Denies double vision. Denies loss of consciousness episodes. Denies urinary urgency or frequency/hesitancy of urination.    Past Medical Hx:    Past Medical History:   Diagnosis Date    Anxiety     Chorea     Chronic kidney disease     Depression     Diabetes mellitus, type 2 (CMS-HCC)     Hyperlipidemia     Hypertension     Hyperthyroidism        Past Surgical Hx:    Past Surgical History:   Procedure Laterality Date    NEPHRECTOMY TRANSPLANTED ORGAN Bilateral 2010       Social Hx:    Social History     Socioeconomic History    Marital  status: Widowed     Spouse name: None    Number of children: None    Years of education: None    Highest education level: None   Tobacco Use    Smoking status: Never     Passive exposure: Never    Smokeless tobacco: Never   Substance and Sexual Activity    Alcohol use: Yes     Comment: 1 per month    Drug use: No    Sexual activity: Not Currently   Social History Narrative    1.  Retired Naval architect    2.  Lives alone, but nearby his mother in [place].    3.  Patient is a widower    4.  Denies any known exposure to toxic substances such as industrial pesticides or radiation.       Family Hx:    Family History   Problem Relation Age of Onset    Thyroid disease Mother     Brain cancer Father     Hearing loss Father     No Known Problems Sister     Diabetes Maternal Grandfather     No Known Problems Sister     Hepatitis Son         Hepatitis C    No Known Problems Daughter        ALLERGIES:  No Known Allergies    CURRENT MEDICATIONS:    Current Outpatient Medications   Medication Sig Dispense Refill    amLODIPine (NORVASC) 5 MG tablet Take 1 tablet (5 mg total) by mouth daily.      atorvastatin (LIPITOR) 10 MG tablet Take 1 tablet (10 mg total) by mouth in the morning.      CAPTOPRIL ORAL Take 6.25 mg by mouth two (2) times a day.      carvediloL (COREG) 12.5 MG tablet Take 1 tablet (12.5 mg total) by mouth Two (2) times a day.      deutetrabenazine (AUSTEDO) 6 mg Tab Take 1 tablet (6 mg total) by mouth two (2) times a day. 60 tablet 6    dulaglutide 1.5 mg/0.5 mL PnIj Inject 1 mL (3 mg total) under the skin every seven (7) days.      furosemide (LASIX) 20 MG tablet Take 1 tablet (20 mg total) by mouth daily.      insulin degludec 200 unit/mL (3 mL) InPn Inject under the skin daily.       levothyroxine (SYNTHROID, LEVOTHROID) 100 MCG tablet Take 1 tablet (100 mcg total) by mouth daily at 0600.      mycophenolate (CELLCEPT) 500 mg tablet Take by mouth Two (2) times a day.      predniSONE (DELTASONE) 5 MG tablet Take 1 tablet (5 mg total) by mouth daily.      tacrolimus (PROGRAF) 0.5 MG capsule Take 3 capsules (1.5 mg total) by mouth Two (2) times a day. TAKE 4 CAPSULES BY MOUTH TWICE DAILY      lisinopril (PRINIVIL,ZESTRIL) 10 MG tablet Take 10 mg by mouth nightly.  (Patient not taking: Reported on 02/28/2021)  3    risperiDONE (RISPERDAL) 0.25 MG tablet Take 0.25 mg by mouth Two (2) times a day. Per patient (Patient not taking: Reported on 09/05/2021)      sodium zirconium cyclosilicate (LOKELMA) 10 gram PwPk packet Lokelma 10 gram oral powder packet   TAKE 1 PACKET 3 x day (Patient not taking: Reported on 09/05/2021)       No current facility-administered medications for  this visit.        Objective:      Physical Exam:  Vitals:    09/05/21 0749 09/05/21 0800 09/05/21 0801   BP Site: L Arm L Arm L Arm   BP Position: Sitting Standing Sitting   BP Cuff Size: Medium Medium Medium        Neurological Examination:   MS: AAOx3, naming/fluency/repetition intact. Following lateralizing commands across midline Cranial nerves: PERRL, approx. 4.5 to 3.49mm brisk bilaterally. EOMI except for inability to bury sclera on bilateral, lateral gaze, (abducting eye only (even with monocular testing)). No end gaze horizontal nystagmus appreciated, no interrupting saccades.  No evidence today for hypometric nor hypermetric saccades.  VFF to confrontation. No facial asymmetry. Tongue protrudes midline.  V1-3 intact to light touch and pinprick. SCM and trapezius 5/5 bilaterally.    Motor: symmetric bulk throughout. No fasciculations.       MOVEMENT DISORDERS EXAMINATION:  Facial expression was normal . Volume of speech was normal . Handwriting was not assessed. There was no evidence of rest tremor. There was no evidence of postural tremor. There was mild  action tremor in bil upper extremities. Tone was normal in the neck and tone was normal in the right upper extremity, right lower extremity and left lower extremity, but debatably there was increased tone in the left upper extremity today (definitely influenced by paratonias).  Rapid alternating movements were mildly decreased in amplitude and speed with mild breakdown in the left upper extremity and left lower extremity.   He could get up from a low lying chair without help of both hands..  Gait was steady with good heel to toe stride without alternating length of stride.  Arm swing was normal bilaterally with moderate parakinesias.     In addition, there were evident choreiform movements of the upper extremities, right upper extremity mildly worse than left upper extremity again, not present in the trunk; present in lower limbs only while seated today, not while ambulating (right worse than left). Minimal orobuccolingual movements appreciated without upper facial involvement.  Please see AIMS score below mostly for monitoring purposes.    Coordination: Slight overshoot with finger chase.     Testing:      AIMS TARDIVE DYSKINESIA SCALE    Facial and Oral Movements  1. Muscles of facial expression (e.g. movements of forehead, eyebrows, periorbital area, cheeks. Include frowning, blinking, grimacing of upper face)   2 = mild    2. Lips and perioral area (e.g. puckering, pouting, smacking)  2 = mild    3. Jaw (biting, clenching, chewing, mouth opening, lateral movement)  2 = mild    4. Tongue (rate only increase in movement both in and out of mouth, not inability to sustain movement)  1 = minimal (may be extreme normal)    Extremity Movements   5. Upper (arms, wrists, hands, fingers; include movements that are choreic or athetoid.  Do not include tremor)            3 = moderate     6. Lower (legs, knees, ankles, toes; e.g. lateral knee movement, foot tapping, heel dropping, foot squirming, inversion and eversion of foot)            3 = moderate    Trunk Movements   7.  Neck, shoulders, hips (rocking, twisting, squirming, pelvic gyrations.  Include diaphragmatic movements)   2 = mild    Global Judgements  8.  Severity of abnormal movements  2 = mild    Based on the highest single score on the above items  9.  Incapacitation due to abnormal movements  1 = minimal    10.  Patient's awareness of abnormal movements  1 = aware, no distress    11.  Current problems with teeth and/or dentures  0 = no    12.  Does patient usually wear dentures?  0 = no  ______________________________________________________________________  TOTAL: 19

## 2021-09-05 NOTE — Unmapped (Addendum)
Follow up with your primary care for the diarrhea. Diarrhea for a month is unusual. Lease call and get in as soon as possible. Be sure to follow up with the tsh.   I placed a referral to PT.   Please drink 2 liters of water a day.   Follow up 1-2 months.

## 2021-09-07 DIAGNOSIS — E1122 Type 2 diabetes mellitus with diabetic chronic kidney disease: Secondary | ICD-10-CM | POA: Diagnosis not present

## 2021-09-07 DIAGNOSIS — I129 Hypertensive chronic kidney disease with stage 1 through stage 4 chronic kidney disease, or unspecified chronic kidney disease: Secondary | ICD-10-CM | POA: Diagnosis not present

## 2021-09-07 DIAGNOSIS — N179 Acute kidney failure, unspecified: Secondary | ICD-10-CM | POA: Diagnosis not present

## 2021-09-07 DIAGNOSIS — N183 Chronic kidney disease, stage 3 unspecified: Secondary | ICD-10-CM | POA: Diagnosis not present

## 2021-09-07 DIAGNOSIS — Z940 Kidney transplant status: Secondary | ICD-10-CM | POA: Diagnosis not present

## 2021-09-14 DIAGNOSIS — N179 Acute kidney failure, unspecified: Secondary | ICD-10-CM | POA: Diagnosis not present

## 2021-09-14 DIAGNOSIS — I10 Essential (primary) hypertension: Secondary | ICD-10-CM | POA: Diagnosis not present

## 2021-09-19 DIAGNOSIS — N184 Chronic kidney disease, stage 4 (severe): Secondary | ICD-10-CM | POA: Diagnosis not present

## 2021-09-19 DIAGNOSIS — E875 Hyperkalemia: Secondary | ICD-10-CM | POA: Diagnosis not present

## 2021-09-19 DIAGNOSIS — Z940 Kidney transplant status: Secondary | ICD-10-CM | POA: Diagnosis not present

## 2021-09-19 DIAGNOSIS — I10 Essential (primary) hypertension: Secondary | ICD-10-CM | POA: Diagnosis not present

## 2021-09-19 DIAGNOSIS — D509 Iron deficiency anemia, unspecified: Secondary | ICD-10-CM | POA: Diagnosis not present

## 2021-09-19 DIAGNOSIS — I5032 Chronic diastolic (congestive) heart failure: Secondary | ICD-10-CM | POA: Diagnosis not present

## 2021-09-19 DIAGNOSIS — G255 Other chorea: Secondary | ICD-10-CM | POA: Diagnosis not present

## 2021-09-19 DIAGNOSIS — Z8619 Personal history of other infectious and parasitic diseases: Secondary | ICD-10-CM | POA: Diagnosis not present

## 2021-09-19 DIAGNOSIS — E1169 Type 2 diabetes mellitus with other specified complication: Secondary | ICD-10-CM | POA: Diagnosis not present

## 2021-09-19 DIAGNOSIS — E669 Obesity, unspecified: Secondary | ICD-10-CM | POA: Diagnosis not present

## 2021-09-19 DIAGNOSIS — R197 Diarrhea, unspecified: Secondary | ICD-10-CM | POA: Diagnosis not present

## 2021-09-19 DIAGNOSIS — E039 Hypothyroidism, unspecified: Secondary | ICD-10-CM | POA: Diagnosis not present

## 2021-09-29 DIAGNOSIS — Z940 Kidney transplant status: Secondary | ICD-10-CM | POA: Diagnosis not present

## 2021-09-29 DIAGNOSIS — N183 Chronic kidney disease, stage 3 unspecified: Secondary | ICD-10-CM | POA: Diagnosis not present

## 2021-09-29 DIAGNOSIS — E039 Hypothyroidism, unspecified: Secondary | ICD-10-CM | POA: Diagnosis not present

## 2021-10-04 DIAGNOSIS — M6281 Muscle weakness (generalized): Secondary | ICD-10-CM | POA: Diagnosis not present

## 2021-10-04 DIAGNOSIS — R2689 Other abnormalities of gait and mobility: Secondary | ICD-10-CM | POA: Diagnosis not present

## 2021-10-04 DIAGNOSIS — R262 Difficulty in walking, not elsewhere classified: Secondary | ICD-10-CM | POA: Diagnosis not present

## 2021-10-05 DIAGNOSIS — D631 Anemia in chronic kidney disease: Secondary | ICD-10-CM | POA: Diagnosis not present

## 2021-10-05 DIAGNOSIS — N183 Chronic kidney disease, stage 3 unspecified: Secondary | ICD-10-CM | POA: Diagnosis not present

## 2021-10-05 DIAGNOSIS — Z79899 Other long term (current) drug therapy: Secondary | ICD-10-CM | POA: Diagnosis not present

## 2021-10-05 DIAGNOSIS — Z940 Kidney transplant status: Secondary | ICD-10-CM | POA: Diagnosis not present

## 2021-10-05 DIAGNOSIS — I129 Hypertensive chronic kidney disease with stage 1 through stage 4 chronic kidney disease, or unspecified chronic kidney disease: Secondary | ICD-10-CM | POA: Diagnosis not present

## 2021-10-05 DIAGNOSIS — N179 Acute kidney failure, unspecified: Secondary | ICD-10-CM | POA: Diagnosis not present

## 2021-10-05 DIAGNOSIS — G255 Other chorea: Secondary | ICD-10-CM | POA: Diagnosis not present

## 2021-10-05 DIAGNOSIS — E1122 Type 2 diabetes mellitus with diabetic chronic kidney disease: Secondary | ICD-10-CM | POA: Diagnosis not present

## 2021-10-05 NOTE — Unmapped (Signed)
[facility] Refill Coordination Note    Specialty Medication(s) to be Shipped:   Neurology: [name]    Other medication(s) to be shipped: No additional medications requested for fill at this time     Gary Rogers, DOB: 1955/11/15  Phone: [phone] (home)       All above HIPAA information was verified with patient.     Was a Nurse, learning disability used for this call? No    Completed refill call assessment today to schedule patient's medication shipment from the [facility] [phone]).  All relevant notes have been reviewed.     Specialty medication(s) and dose(s) confirmed: Regimen is correct and unchanged.   Changes to medications: Gary Rogers reports no changes at this time.  Changes to insurance: No  New side effects reported not previously addressed with a pharmacist or physician: None reported  Questions for the pharmacist: No    Confirmed patient received a Conservation officer, historic buildings and a Surveyor, mining with first shipment. The patient will receive a drug information handout for each medication shipped and additional FDA Medication Guides as required.       DISEASE/MEDICATION-SPECIFIC INFORMATION        N/A    SPECIALTY MEDICATION ADHERENCE     Medication Adherence    Patient reported X missed doses in the last month: 0  Specialty Medication: AUSTEDO 6 mg  Patient is on additional specialty medications: No  Patient is on more than two specialty medications: No  Any gaps in refill history greater than 2 weeks in the last 3 months: no  Demonstrates understanding of importance of adherence: yes  Informant: patient              Were doses missed due to medication being on hold? No    Austedo 6mg : Patient has 14 days of medication on hand    REFERRAL TO PHARMACIST     Referral to the pharmacist: Not needed      [facility]     Shipping address confirmed in Epic.     Delivery Scheduled: Yes, Expected medication delivery date: 6/23.     Medication will be delivered via UPS to the prescription address in Epic WAM.    [name]   [facility] Specialty Technician

## 2021-10-09 ENCOUNTER — Other Ambulatory Visit (HOSPITAL_COMMUNITY): Payer: Self-pay | Admitting: Nephrology

## 2021-10-09 ENCOUNTER — Other Ambulatory Visit: Payer: Self-pay | Admitting: Nephrology

## 2021-10-09 DIAGNOSIS — M6281 Muscle weakness (generalized): Secondary | ICD-10-CM | POA: Diagnosis not present

## 2021-10-09 DIAGNOSIS — R2689 Other abnormalities of gait and mobility: Secondary | ICD-10-CM | POA: Diagnosis not present

## 2021-10-09 DIAGNOSIS — R262 Difficulty in walking, not elsewhere classified: Secondary | ICD-10-CM | POA: Diagnosis not present

## 2021-10-09 DIAGNOSIS — N189 Chronic kidney disease, unspecified: Secondary | ICD-10-CM

## 2021-10-11 DIAGNOSIS — R262 Difficulty in walking, not elsewhere classified: Secondary | ICD-10-CM | POA: Diagnosis not present

## 2021-10-11 DIAGNOSIS — R2689 Other abnormalities of gait and mobility: Secondary | ICD-10-CM | POA: Diagnosis not present

## 2021-10-11 DIAGNOSIS — M6281 Muscle weakness (generalized): Secondary | ICD-10-CM | POA: Diagnosis not present

## 2021-10-12 MED FILL — AUSTEDO 6 MG TABLET: ORAL | 30 days supply | Qty: 60 | Fill #1

## 2021-10-18 ENCOUNTER — Ambulatory Visit (HOSPITAL_COMMUNITY)
Admission: RE | Admit: 2021-10-18 | Discharge: 2021-10-18 | Disposition: A | Discharge status: Home / Self Care | Payer: Medicare Other | Source: Ambulatory Visit | Attending: Nephrology | Admitting: Nephrology

## 2021-10-18 DIAGNOSIS — N289 Disorder of kidney and ureter, unspecified: Secondary | ICD-10-CM | POA: Diagnosis not present

## 2021-10-18 DIAGNOSIS — N179 Acute kidney failure, unspecified: Secondary | ICD-10-CM | POA: Insufficient documentation

## 2021-10-18 DIAGNOSIS — N189 Chronic kidney disease, unspecified: Secondary | ICD-10-CM | POA: Diagnosis not present

## 2021-10-18 DIAGNOSIS — Z940 Kidney transplant status: Secondary | ICD-10-CM | POA: Diagnosis not present

## 2021-10-20 ENCOUNTER — Ambulatory Visit: Admit: 2021-10-20 | Discharge: 2021-10-21 | Payer: MEDICARE | Attending: Family | Primary: Family

## 2021-10-20 DIAGNOSIS — Z6832 Body mass index (BMI) 32.0-32.9, adult: Secondary | ICD-10-CM | POA: Diagnosis not present

## 2021-10-20 DIAGNOSIS — G255 Other chorea: Secondary | ICD-10-CM | POA: Diagnosis not present

## 2021-10-20 NOTE — Unmapped (Signed)
Please follow up with primary care about blood pressure.   The shoe brand is Kizik, sketchers also makes a brand.   Follow up 4-6 months.

## 2021-10-20 NOTE — Unmapped (Signed)
FINL [address]  [facility] NEUROLOGY CLINIC [place] [address] [place]  [address]  [place] [address]  [phone]    Date: 10/20/2021  Patient Name: Gary Rogers  MRN: [id number]  PCP: Darlina Guys Jeanice Lim, Darlina Guys, MD    Assessment:      Mr. Wert is a 66 y.o. male who  has a past medical history of Anxiety, Chorea, Chronic kidney disease, Depression, Diabetes mellitus, type 2 (CMS-HCC), Hyperlipidemia, Hypertension, and Hyperthyroidism. presenting in follow up for evaluation of mostly generalized, asymmetric, adventitious movements sparing the upper face (left greater than right), historically beginning in a rather acute/subacute fashion in 2009 when he apparently had very poorly controlled blood sugars and development/appreciation of chronic kidney disease requiring renal transplant, with subsequent improvement of his chorea both subjectively and apparently objectively through follow-up clinic notes from the [place] [facility] [place] movement disorder group initially after performing renal transplant, subsequently with introduction of neuroleptics and most recently dopamine depletors, likely etiology of 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    1. 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 He develops parakinesias during examination, with a nonstereoptyped movement that flows from joint to joint, matching with suspected choreiform disorder. Some of his speech difficulties could be apraxic in nature versus ataxic in nature should he have had any involvement of his brainstem/cerebellum with the underlying toxic/metabolic process, but may just as well be related to lower facial chorea. I might argue the latter case after seeing his lower facial dyskinesias more vividly in the past off of therapy.  Patient historically has benefited from tetrabenazine with more evident EPS symptoms that were even bothering the patient for which reason we discontinued therapy.  Deutetrabenazine has been well-tolerated with what appear to be minimal EPS symptoms which are not bothering the patient, and overall feels as though his choreiform movement is well controlled.    Since last visit patient has been having some difficulty with chronic health issues. His TSH is being managed by primary care and they have increased with levothyroxine with some benefit felt.  We will continue current dose of Austedo unchanged as he feels there is not currently a need to increase, though certainly could consider augmentation in the future since he is taking quite low doses at the moment.     Today on exam he does have EPS symptoms, left more so than right. He does have some mild postural instability when pulled. He demonstrated mild overshooting with finger chase.    Still having 20 point systolic bp drop with standing, but dizziness and lightheadedness has improved. Blood pressure is elevated today, he will follow up with primary care. Discussed going to ED if any issues with chest pain or headaches.     Advised on fall precautions and will continue with PT.    Diarrhea is now improved. Will continue to monitor.    I personally spent 35 minutes face-to-face and non-face-to-face in the care of this patient, which includes all pre, intra, and post visit time on the date of service.  All documented time was specific to  the E/M visit and does not include any procedures that may have been performed.       Plan: Please follow up with primary care about blood pressure.   The shoe brand is Kizik, sket[employer] also makes a brand.   Follow up 4-6 months.      Subjective:      HPI: Patient is a 65 y.o. left-handed Caucasian male with past medical history of chronic kidney disease status post renal transplant in 2010, diabetes mellitus previously quite poorly controlled, who presents for follow up of adventitious movements that have been present throughout the body since approximately 2009. These were felt to be toxic-metabolic induced choreaform movements. Please review my initial clinic visit note dated on 09/29.2016 for more information regarding history of present illness.    Interim history: Patient presents today with wife. They feel his movements are the same, maybe a little better and his is now doing better with the diarrhea. They also increased his thyroid medication. Overall he is feeling improved. He does not feel his movements interfere with his daily life currently.    He did fall Sunday due to slipping on some water.     He is not as dizzy now and not having has much difficulty with and walking. He is doing PT and enjoying this. They are going to the beach this weekend.    CHOREA/DYSKINESIA REGIMEN:  Austedo 6 mg 1 tablet twice a day    On review of systems, the patient denies hallucinations, impulsivity, irritability, current memory deficits.  Denies double vision. Denies loss of consciousness episodes. Denies urinary urgency or frequency/hesitancy of urination.    Past Medical Hx:    Past Medical History:   Diagnosis Date    Anxiety     Chorea     Chronic kidney disease     Depression     Diabetes mellitus, type 2 (CMS-HCC)     Hyperlipidemia     Hypertension     Hyperthyroidism        Past Surgical Hx:    Past Surgical History:   Procedure Laterality Date    NEPHRECTOMY TRANSPLANTED ORGAN Bilateral 2010       Social Hx:    Social History     Socioeconomic History    Marital status: Widowed   Tobacco Use Smoking status: Never     Passive exposure: Never    Smokeless tobacco: Never   Substance and Sexual Activity    Alcohol use: Yes     Comment: 1 per month    Drug use: No    Sexual activity: Not Currently   Social History Narrative    1.  Retired Naval architect    2.  Lives alone, but nearby his mother in [place].    3.  Patient is a widower    4.  Denies any known exposure to toxic substances such as industrial pesticides or radiation.       Family Hx:    Family History   Problem Relation Age of Onset    Thyroid disease Mother     Brain cancer Father     Hearing loss Father     No Known Problems Sister     Diabetes Maternal Grandfather     No Known Problems Sister     Hepatitis Son         Hepatitis C    No Known Problems Daughter        ALLERGIES:  No Known Allergies  CURRENT MEDICATIONS:    Current Outpatient Medications   Medication Sig Dispense Refill    atorvastatin (LIPITOR) 10 MG tablet Take 1 tablet (10 mg total) by mouth in the morning.      CAPTOPRIL ORAL Take 6.25 mg by mouth two (2) times a day.      carvediloL (COREG) 12.5 MG tablet Take 1 tablet (12.5 mg total) by mouth Two (2) times a day.      deutetrabenazine (AUSTEDO) 6 mg Tab Take 1 tablet (6 mg total) by mouth two (2) times a day. 60 tablet 6    furosemide (LASIX) 20 MG tablet Take 1 tablet (20 mg total) by mouth daily.      glucagon (GVOKE HYPOPEN 1-PACK) 1 mg/0.2 mL AtIn Keep on hand      insulin degludec 200 unit/mL (3 mL) InPn Inject under the skin daily.       levothyroxine (SYNTHROID, LEVOTHROID) 100 MCG tablet Take 1 tablet (100 mcg total) by mouth daily at 0600.      mycophenolate (CELLCEPT) 500 mg tablet Take by mouth Two (2) times a day.      OMNIPOD DASH PODS, GEN 4, Crtg CHANGE POD EVERY 72 HOURS      predniSONE (DELTASONE) 5 MG tablet Take 1 tablet (5 mg total) by mouth daily.      tacrolimus (PROGRAF) 0.5 MG capsule Take 3 capsules (1.5 mg total) by mouth Two (2) times a day. TAKE 4 CAPSULES BY MOUTH TWICE DAILY      amLODIPine (NORVASC) 5 MG tablet Take 1 tablet (5 mg total) by mouth daily. (Patient not taking: Reported on 10/20/2021)      dulaglutide 1.5 mg/0.5 mL PnIj Inject 1 mL (3 mg total) under the skin every seven (7) days.       No current facility-administered medications for this visit.        Objective:      Physical Exam:  Vitals:    10/20/21 0745 10/20/21 0751 10/20/21 0753   Orthostatic BP: 182/90 145/73 194/92   Orthostatic Pulse: 94 70 66   BP Site: L Arm L Arm L Arm   BP Position: Sitting Standing Sitting   BP Cuff Size: Large Large Large        Neurological Examination:   MS: AAOx3, naming/fluency/repetition intact. Following lateralizing commands across midline     Cranial nerves: PERRL, approx. 4.5 to 3.10mm brisk bilaterally. EOMI except for inability to bury sclera on bilateral, lateral gaze, (abducting eye only (even with monocular testing)). No end gaze horizontal nystagmus appreciated, no interrupting saccades.  No evidence today for hypometric nor hypermetric saccades.  VFF to confrontation. No facial asymmetry. Tongue protrudes midline.  V1-3 intact to light touch and pinprick. SCM and trapezius 5/5 bilaterally.    Motor: symmetric bulk throughout. No fasciculations.       MOVEMENT DISORDERS EXAMINATION:  Facial expression was normal . Volume of speech was normal . Handwriting was not assessed. There was no evidence of rest tremor. There was no evidence of postural tremor. There was mild  action tremor in bil upper extremities. Tone was normal in the neck and tone was normal in the right upper extremity, right lower extremity and left lower extremity, but debatably there was increased tone in the left upper extremity today (definitely influenced by paratonias).  Rapid alternating movements were mildly decreased in amplitude and speed with mild breakdown in the left upper extremity and left lower extremity.   He could get up from a  low lying chair without help of both hands..  Gait was steady with good heel to toe stride without alternating length of stride.  Arm swing was normal bilaterally with moderate parakinesias.     In addition, there were evident choreiform movements of the upper extremities, right upper extremity mildly worse than left upper extremity again, not present in the trunk; present in lower limbs only while seated today, not while ambulating (right worse than left). Moderate orobuccolingual movements appreciated without upper facial involvement.  Please see AIMS score below mostly for monitoring purposes.    Coordination: Slight overshoot with finger chase.     Testing:      AIMS TARDIVE DYSKINESIA SCALE    Facial and Oral Movements  1. Muscles of facial expression (e.g. movements of forehead, eyebrows, periorbital area, cheeks. Include frowning, blinking, grimacing of upper face)   2 = mild    2. Lips and perioral area (e.g. puckering, pouting, smacking)  3 = moderate    3. Jaw (biting, clenching, chewing, mouth opening, lateral movement)  2 = mild    4. Tongue (rate only increase in movement both in and out of mouth, not inability to sustain movement)  1 = minimal (may be extreme normal)    Extremity Movements   5. Upper (arms, wrists, hands, fingers; include movements that are choreic or athetoid.  Do not include tremor)            2 = mild     6. Lower (legs, knees, ankles, toes; e.g. lateral knee movement, foot tapping, heel dropping, foot squirming, inversion and eversion of foot)            3 = moderate    Trunk Movements   7.  Neck, shoulders, hips (rocking, twisting, squirming, pelvic gyrations.  Include diaphragmatic movements)   2 = mild    Global Judgements  8.  Severity of abnormal movements          2 = mild    Based on the highest single score on the above items  9.  Incapacitation due to abnormal movements  1 = minimal    10.  Patient's awareness of abnormal movements  1 = aware, no distress    11.  Current problems with teeth and/or dentures  0 = no    12.  Does patient usually wear dentures?  0 = no  ______________________________________________________________________  TOTAL: 19

## 2021-10-31 DIAGNOSIS — B351 Tinea unguium: Secondary | ICD-10-CM | POA: Diagnosis not present

## 2021-10-31 DIAGNOSIS — E1142 Type 2 diabetes mellitus with diabetic polyneuropathy: Secondary | ICD-10-CM | POA: Diagnosis not present

## 2021-10-31 DIAGNOSIS — D509 Iron deficiency anemia, unspecified: Secondary | ICD-10-CM | POA: Diagnosis not present

## 2021-10-31 DIAGNOSIS — E039 Hypothyroidism, unspecified: Secondary | ICD-10-CM | POA: Diagnosis not present

## 2021-10-31 DIAGNOSIS — E1169 Type 2 diabetes mellitus with other specified complication: Secondary | ICD-10-CM | POA: Diagnosis not present

## 2021-11-03 DIAGNOSIS — E162 Hypoglycemia, unspecified: Secondary | ICD-10-CM | POA: Diagnosis not present

## 2021-11-03 DIAGNOSIS — I129 Hypertensive chronic kidney disease with stage 1 through stage 4 chronic kidney disease, or unspecified chronic kidney disease: Secondary | ICD-10-CM | POA: Diagnosis not present

## 2021-11-03 DIAGNOSIS — E1022 Type 1 diabetes mellitus with diabetic chronic kidney disease: Secondary | ICD-10-CM | POA: Diagnosis not present

## 2021-11-03 DIAGNOSIS — N184 Chronic kidney disease, stage 4 (severe): Secondary | ICD-10-CM | POA: Diagnosis not present

## 2021-11-03 DIAGNOSIS — E161 Other hypoglycemia: Secondary | ICD-10-CM | POA: Diagnosis not present

## 2021-11-03 DIAGNOSIS — I10 Essential (primary) hypertension: Secondary | ICD-10-CM | POA: Diagnosis not present

## 2021-11-03 DIAGNOSIS — W19XXXA Unspecified fall, initial encounter: Secondary | ICD-10-CM | POA: Diagnosis not present

## 2021-11-03 DIAGNOSIS — Z794 Long term (current) use of insulin: Secondary | ICD-10-CM | POA: Diagnosis not present

## 2021-11-03 DIAGNOSIS — E11649 Type 2 diabetes mellitus with hypoglycemia without coma: Secondary | ICD-10-CM | POA: Diagnosis not present

## 2021-11-03 DIAGNOSIS — R296 Repeated falls: Secondary | ICD-10-CM | POA: Diagnosis not present

## 2021-11-03 NOTE — Unmapped (Signed)
[facility] Refill Coordination Note    Specialty Medication(s) to be Shipped:   Neurology: [name]    Other medication(s) to be shipped: No additional medications requested for fill at this time     Gary Rogers, DOB: 11-Apr-1956  Phone: [phone] (home)       All above HIPAA information was verified with patient.     Was a Nurse, learning disability used for this call? No    Completed refill call assessment today to schedule patient's medication shipment from the [facility] [phone]).  All relevant notes have been reviewed.     Specialty medication(s) and dose(s) confirmed: Regimen is correct and unchanged.   Changes to medications: Gary Rogers reports no changes at this time.  Changes to insurance: No  New side effects reported not previously addressed with a pharmacist or physician: None reported  Questions for the pharmacist: No    Confirmed patient received a Conservation officer, historic buildings and a Surveyor, mining with first shipment. The patient will receive a drug information handout for each medication shipped and additional FDA Medication Guides as required.       DISEASE/MEDICATION-SPECIFIC INFORMATION        N/A    SPECIALTY MEDICATION ADHERENCE     Medication Adherence    Patient reported X missed doses in the last month: 0  Specialty Medication: AUSTEDO 6 mg  Patient is on additional specialty medications: No  Patient is on more than two specialty medications: No  Any gaps in refill history greater than 2 weeks in the last 3 months: no  Demonstrates understanding of importance of adherence: yes              Were doses missed due to medication being on hold? No  AUSTEDO 6 mg Tab - Patient has  14 day supply on hand      REFERRAL TO PHARMACIST     Referral to the pharmacist: Not needed      [facility]     Shipping address confirmed in Epic.     Delivery Scheduled: Yes, Expected medication delivery date: 11/10/21 .     Medication will be delivered via UPS to the prescription address in Epic WAM.    [name]   [facility] Specialty Technician

## 2021-11-09 MED FILL — AUSTEDO 6 MG TABLET: ORAL | 30 days supply | Qty: 60 | Fill #2

## 2021-11-10 DIAGNOSIS — E872 Acidosis, unspecified: Secondary | ICD-10-CM | POA: Diagnosis not present

## 2021-11-10 DIAGNOSIS — N183 Chronic kidney disease, stage 3 unspecified: Secondary | ICD-10-CM | POA: Diagnosis not present

## 2021-11-10 DIAGNOSIS — E1122 Type 2 diabetes mellitus with diabetic chronic kidney disease: Secondary | ICD-10-CM | POA: Diagnosis not present

## 2021-11-10 DIAGNOSIS — Z79899 Other long term (current) drug therapy: Secondary | ICD-10-CM | POA: Diagnosis not present

## 2021-11-10 DIAGNOSIS — I129 Hypertensive chronic kidney disease with stage 1 through stage 4 chronic kidney disease, or unspecified chronic kidney disease: Secondary | ICD-10-CM | POA: Diagnosis not present

## 2021-11-10 DIAGNOSIS — D631 Anemia in chronic kidney disease: Secondary | ICD-10-CM | POA: Diagnosis not present

## 2021-11-10 DIAGNOSIS — R197 Diarrhea, unspecified: Secondary | ICD-10-CM | POA: Diagnosis not present

## 2021-11-10 DIAGNOSIS — N179 Acute kidney failure, unspecified: Secondary | ICD-10-CM | POA: Diagnosis not present

## 2021-11-10 DIAGNOSIS — Z940 Kidney transplant status: Secondary | ICD-10-CM | POA: Diagnosis not present

## 2021-11-10 DIAGNOSIS — G255 Other chorea: Secondary | ICD-10-CM | POA: Diagnosis not present

## 2021-11-14 DIAGNOSIS — E1169 Type 2 diabetes mellitus with other specified complication: Secondary | ICD-10-CM | POA: Diagnosis not present

## 2021-11-14 DIAGNOSIS — R197 Diarrhea, unspecified: Secondary | ICD-10-CM | POA: Diagnosis not present

## 2021-11-14 DIAGNOSIS — N184 Chronic kidney disease, stage 4 (severe): Secondary | ICD-10-CM | POA: Diagnosis not present

## 2021-11-14 DIAGNOSIS — E039 Hypothyroidism, unspecified: Secondary | ICD-10-CM | POA: Diagnosis not present

## 2021-11-14 DIAGNOSIS — I10 Essential (primary) hypertension: Secondary | ICD-10-CM | POA: Diagnosis not present

## 2021-11-14 DIAGNOSIS — I5032 Chronic diastolic (congestive) heart failure: Secondary | ICD-10-CM | POA: Diagnosis not present

## 2021-11-14 DIAGNOSIS — Z940 Kidney transplant status: Secondary | ICD-10-CM | POA: Diagnosis not present

## 2021-11-14 DIAGNOSIS — R809 Proteinuria, unspecified: Secondary | ICD-10-CM | POA: Diagnosis not present

## 2021-11-14 DIAGNOSIS — Z8619 Personal history of other infectious and parasitic diseases: Secondary | ICD-10-CM | POA: Diagnosis not present

## 2021-11-14 DIAGNOSIS — G255 Other chorea: Secondary | ICD-10-CM | POA: Diagnosis not present

## 2021-11-14 DIAGNOSIS — D509 Iron deficiency anemia, unspecified: Secondary | ICD-10-CM | POA: Diagnosis not present

## 2021-11-14 DIAGNOSIS — E875 Hyperkalemia: Secondary | ICD-10-CM | POA: Diagnosis not present

## 2021-11-15 ENCOUNTER — Ambulatory Visit (INDEPENDENT_AMBULATORY_CARE_PROVIDER_SITE_OTHER): Payer: Medicare Other | Admitting: Gastroenterology

## 2021-11-15 ENCOUNTER — Encounter: Payer: Self-pay | Admitting: *Deleted

## 2021-11-15 ENCOUNTER — Encounter: Payer: Self-pay | Admitting: Gastroenterology

## 2021-11-15 DIAGNOSIS — R194 Change in bowel habit: Secondary | ICD-10-CM

## 2021-11-15 MED ORDER — PANCRELIPASE (LIP-PROT-AMYL) 36000-114000 UNITS PO CPEP
72000.0000 [IU] | ORAL_CAPSULE | Freq: Three times a day (TID) | ORAL | 3 refills | Status: DC
Start: 2021-11-15 — End: 2022-05-22

## 2021-11-15 MED ORDER — PEG 3350-KCL-NA BICARB-NACL 420 G PO SOLR
4000.0000 mL | Freq: Once | ORAL | 0 refills | Status: AC
Start: 2021-11-15 — End: 2021-11-15

## 2021-11-15 NOTE — Progress Notes (Signed)
Gastroenterology Office Note     Primary Care Physician:  Celene Squibb, MD  Primary Gastroenterologist: Dr. Gala Romney    Chief Complaint   Chief Complaint  Patient presents with   Diarrhea    Has had diarrhea since March. States he has one epidsode per day. Has to get diarrhea cleared up before they can do kidney biopsy. Does have a kidney transplant.      History of Present Illness   Nathan Tyler is a 66 y.o. male presenting today due to diarrhea. Last colonoscopy completed 01/19/2019 which found two 4 to 6 mm polyps in the rectum, diverticulosis, otherwise normal.  Surgical pathology, polyps to be one hyperplastic and one tubular adenoma.  Recommended repeat colonoscopy in 5 years.  At least one watery stool per day. Sometimes more than 1. Usually one. Ongoing since March. Prior to March was having formed BM. No cramping or bloating. No pain. Lost about 5 lbs. May skip a day one or 2 days a week. No new medications since March. No rectal bleeding.   Creatinine level going up. No signs of rejection so needs renal biopsy. Needs to have diarrhea addressed prior to this occurring. No recent antibiotics.    Started taking Creon about a month ago but taking before meals.   Past Medical History:  Diagnosis Date   Chorea    Diabetes mellitus without complication (Onset)    Hypercholesteremia    Hypothyroidism    Neuromuscular disorder (HCC)    PONV (postoperative nausea and vomiting)    Renal failure    Has had kidney transplant 2008    Past Surgical History:  Procedure Laterality Date   AMPUTATION Right 06/27/2016   Procedure: AMPUTATION 5TH TOE RIGHT FOOT;  Surgeon: Caprice Beaver, DPM;  Location: [facility];  Service: Podiatry;  Laterality: Right;   AMPUTATION TOE Left 09/02/2015   Procedure: AMPUTATION  2ND TOE LEFT FOOT;  Surgeon: Aviva Signs, MD;  Location: [facility];  Service: General;  Laterality: Left;   CATARACT EXTRACTION Bilateral    COLONOSCOPY  01/2014   done in  [place]. patient reports h/o colon polyps on TCS in 2010, 2015 colonoscopy normal but advised to come back in 2020.    COLONOSCOPY WITH PROPOFOL N/A 01/19/2019   Procedure: COLONOSCOPY WITH PROPOFOL;  Surgeon: Daneil Dolin, MD;  Location: [facility];  Service: Endoscopy;  Laterality: N/A;  9:45am   KIDNEY TRANSPLANT Right 2010   POLYPECTOMY  01/19/2019   Procedure: POLYPECTOMY;  Surgeon: Daneil Dolin, MD;  Location: [facility];  Service: Endoscopy;;    Current Outpatient Medications  Medication Sig Dispense Refill   atorvastatin (LIPITOR) 10 MG tablet Take 10 mg by mouth daily.     Deutetrabenazine (AUSTEDO) 6 MG TABS Take 6 mg by mouth 2 (two) times daily.     Dulaglutide (TRULICITY) 1.5 TI/1.4ER SOPN Inject 1.5 mg into the skin every 7 (seven) days.      furosemide (LASIX) 20 MG tablet Take 20 mg by mouth daily.      levothyroxine (SYNTHROID) 125 MCG tablet Take 125 mcg by mouth daily.     mycophenolate (CELLCEPT) 500 MG tablet Take 500 mg by mouth 2 (two) times daily.     NOVOLOG 100 UNIT/ML injection SMARTSIG:0-200 Unit(s) SUB-Q Every 3 Days     predniSONE (DELTASONE) 5 MG tablet Take 1 tablet (5 mg total) by mouth daily with breakfast. restart '5mg'$  dose 3/10 after completing prednisone taper (2/24 -  3/9)     tacrolimus (PROGRAF) 0.5 MG capsule Take 1.5 mg by mouth 2 (two) times daily.      No current facility-administered medications for this visit.    Allergies as of 11/15/2021   (No Known Allergies)    Family History  Problem Relation Age of Onset   Colon cancer Maternal Aunt 75       passed from [facility]    Social History   Socioeconomic History   Marital status: Widowed    Spouse name: Not on file   Number of children: Not on file   Years of education: Not on file   Highest education level: Not on file  Occupational History   Not on file  Tobacco Use   Smoking status: Never    Passive exposure: Never   Smokeless tobacco: Never  Vaping Use   Vaping Use:  Never used  Substance and Sexual Activity   Alcohol use: Yes    Alcohol/week: 0.0 standard drinks of alcohol    Comment: As of 09/02/2018: Glass of wine 1-2 times a month   Drug use: No   Sexual activity: Yes    Birth control/protection: None  Other Topics Concern   Not on file  Social History Narrative   Not on file   Social Determinants of Health   Financial Resource Strain: Not on file  Food Insecurity: Not on file  Transportation Needs: Not on file  Physical Activity: Not on file  Stress: Not on file  Social Connections: Not on file  Intimate Partner Violence: Not on file     Review of Systems   Gen: Denies any fever, chills, fatigue, weight loss, lack of appetite.  CV: Denies chest pain, heart palpitations, peripheral edema, syncope.  Resp: Denies shortness of breath at rest or with exertion. Denies wheezing or cough.  GI: see HPI GU : Denies urinary burning, urinary frequency, urinary hesitancy MS: Denies joint pain, muscle weakness, cramps, or limitation of movement.  Derm: Denies rash, itching, dry skin Psych: Denies depression, anxiety, memory loss, and confusion Heme: Denies bruising, bleeding, and enlarged lymph nodes.   Physical Exam   BP (!) 144/75 (BP Location: Right Arm, Patient Position: Sitting, Cuff Size: Large)   Pulse (!) 52   Temp 97.8 F (36.6 C) (Oral)   Ht '5\' 11"'$  (1.803 m)   Wt 235 lb 14.4 oz (107 kg)   BMI 32.90 kg/m  General:   Alert and oriented. Pleasant and cooperative. Well-nourished and well-developed.  Head:  Normocephalic and atraumatic. Eyes:  Without icterus Abdomen:  +BS, soft, non-tender and non-distended. No HSM noted. No guarding or rebound. No masses appreciated.  Rectal:  Deferred  Msk:  Symmetrical without gross deformities. Normal posture. Extremities:  Without edema. Neurologic:  Alert and  oriented x4;  grossly normal neurologically. Skin:  Intact without significant lesions or rashes. Psych:  Alert and cooperative.  Normal mood and affect.   Assessment   Nathan Tyler is a 66 y.o. male presenting today due to diarrhea. Last colonoscopy completed 01/19/2019 which found two 4 to 6 mm polyps in the rectum, diverticulosis, otherwise normal.  Surgical pathology, polyps to be one hyperplastic and one tubular adenoma.  Recommended repeat colonoscopy in 5 years.  He has a history of renal transplant and now with worsening creatinine level; management of diarrhea requested prior to renal biopsy. I do note that he has been taking Creon but only one capsule BEFORE eating. We will have him take 2 capsules  while eating. I have sent this into the pharmacy.    PLAN   Creon 72,000 units with meals, 36,000 with snacks Colonoscopy if no improvement in diarrhea with Creon.   Annitta Needs, PhD, ANP-BC [facility]

## 2021-11-15 NOTE — Patient Instructions (Signed)
I would like for you to take 2 capsules of Creon WHILE eating, three times a day. You can take 1 capsule with snacks. (This is a total of 72,000 units per meal and 36,000 units per snack).  We are arranging a colonoscopy just in case it is needed. However, if diarrhea resolves, we can cancel this!  It was a pleasure to see you today. I want to create trusting relationships with patients to provide genuine, compassionate, and quality care. I value your feedback. If you receive a survey regarding your visit,  I greatly appreciate you taking time to fill this out.   Annitta Needs, PhD, ANP-BC [facility]

## 2021-11-16 ENCOUNTER — Telehealth: Payer: Self-pay | Admitting: *Deleted

## 2021-11-16 NOTE — Telephone Encounter (Signed)
Called pt, no answer and no VM to give pre-op appt

## 2021-11-16 NOTE — Telephone Encounter (Signed)
Called pt, no answer and not able to leave VM 

## 2021-11-17 NOTE — Telephone Encounter (Signed)
Called pt, no answer and no VM. Called SO in chart and LMOVM for pt to call back

## 2021-11-20 ENCOUNTER — Encounter: Payer: Self-pay | Admitting: *Deleted

## 2021-11-20 NOTE — Telephone Encounter (Signed)
Pt aware of pre-op appt details. He is coming by office to pick up prep instructions. Left for pick up

## 2021-11-21 DIAGNOSIS — N189 Chronic kidney disease, unspecified: Secondary | ICD-10-CM | POA: Diagnosis not present

## 2021-11-21 DIAGNOSIS — Z940 Kidney transplant status: Secondary | ICD-10-CM | POA: Diagnosis not present

## 2021-11-21 DIAGNOSIS — N179 Acute kidney failure, unspecified: Secondary | ICD-10-CM | POA: Diagnosis not present

## 2021-11-22 ENCOUNTER — Encounter (HOSPITAL_COMMUNITY)
Admission: RE | Admit: 2021-11-22 | Discharge: 2021-11-22 | Disposition: A | Discharge status: Home / Self Care | Payer: Medicare Other | Source: Ambulatory Visit | Attending: Internal Medicine | Admitting: Internal Medicine

## 2021-11-24 ENCOUNTER — Encounter (HOSPITAL_COMMUNITY): Payer: Self-pay | Admitting: Internal Medicine

## 2021-11-24 ENCOUNTER — Ambulatory Visit (HOSPITAL_COMMUNITY)
Admission: RE | Admit: 2021-11-24 | Discharge: 2021-11-24 | Disposition: A | Discharge status: Home / Self Care | Payer: Medicare Other | Attending: Internal Medicine | Admitting: Internal Medicine

## 2021-11-24 ENCOUNTER — Ambulatory Visit (HOSPITAL_COMMUNITY): Payer: Medicare Other | Admitting: Anesthesiology

## 2021-11-24 ENCOUNTER — Ambulatory Visit (HOSPITAL_BASED_OUTPATIENT_CLINIC_OR_DEPARTMENT_OTHER): Payer: Medicare Other | Admitting: Anesthesiology

## 2021-11-24 ENCOUNTER — Encounter (HOSPITAL_COMMUNITY)
Admission: RE | Disposition: A | Discharge status: Home / Self Care | Payer: Self-pay | Source: Home / Self Care | Attending: Internal Medicine

## 2021-11-24 DIAGNOSIS — K529 Noninfective gastroenteritis and colitis, unspecified: Secondary | ICD-10-CM | POA: Insufficient documentation

## 2021-11-24 DIAGNOSIS — K635 Polyp of colon: Secondary | ICD-10-CM

## 2021-11-24 DIAGNOSIS — Z8619 Personal history of other infectious and parasitic diseases: Secondary | ICD-10-CM | POA: Diagnosis not present

## 2021-11-24 DIAGNOSIS — K5730 Diverticulosis of large intestine without perforation or abscess without bleeding: Secondary | ICD-10-CM

## 2021-11-24 DIAGNOSIS — D123 Benign neoplasm of transverse colon: Secondary | ICD-10-CM | POA: Diagnosis not present

## 2021-11-24 DIAGNOSIS — N189 Chronic kidney disease, unspecified: Secondary | ICD-10-CM

## 2021-11-24 DIAGNOSIS — Z940 Kidney transplant status: Secondary | ICD-10-CM | POA: Diagnosis not present

## 2021-11-24 DIAGNOSIS — Z794 Long term (current) use of insulin: Secondary | ICD-10-CM | POA: Diagnosis not present

## 2021-11-24 DIAGNOSIS — E1122 Type 2 diabetes mellitus with diabetic chronic kidney disease: Secondary | ICD-10-CM | POA: Insufficient documentation

## 2021-11-24 DIAGNOSIS — G255 Other chorea: Secondary | ICD-10-CM | POA: Insufficient documentation

## 2021-11-24 DIAGNOSIS — D124 Benign neoplasm of descending colon: Secondary | ICD-10-CM | POA: Diagnosis not present

## 2021-11-24 DIAGNOSIS — Z7985 Long-term (current) use of injectable non-insulin antidiabetic drugs: Secondary | ICD-10-CM | POA: Diagnosis not present

## 2021-11-24 DIAGNOSIS — D122 Benign neoplasm of ascending colon: Secondary | ICD-10-CM | POA: Diagnosis not present

## 2021-11-24 DIAGNOSIS — I129 Hypertensive chronic kidney disease with stage 1 through stage 4 chronic kidney disease, or unspecified chronic kidney disease: Secondary | ICD-10-CM | POA: Insufficient documentation

## 2021-11-24 DIAGNOSIS — R197 Diarrhea, unspecified: Secondary | ICD-10-CM | POA: Diagnosis not present

## 2021-11-24 DIAGNOSIS — E039 Hypothyroidism, unspecified: Secondary | ICD-10-CM | POA: Diagnosis not present

## 2021-11-24 DIAGNOSIS — Z79899 Other long term (current) drug therapy: Secondary | ICD-10-CM | POA: Insufficient documentation

## 2021-11-24 HISTORY — PX: BIOPSY: SHX5522

## 2021-11-24 HISTORY — PX: POLYPECTOMY: SHX5525

## 2021-11-24 HISTORY — PX: COLONOSCOPY WITH PROPOFOL: SHX5780

## 2021-11-24 LAB — GLUCOSE, CAPILLARY
Glucose-Capillary: 41 mg/dL — CL (ref 70–99)
Glucose-Capillary: 116 mg/dL — ABNORMAL HIGH (ref 70–99)
Glucose-Capillary: 76 mg/dL (ref 70–99)

## 2021-11-24 SURGERY — COLONOSCOPY WITH PROPOFOL
Anesthesia: General

## 2021-11-24 MED ORDER — DEXTROSE 50 % IV SOLN
INTRAVENOUS | Status: AC
  Filled 2021-11-24: qty 50

## 2021-11-24 MED ORDER — PROPOFOL 10 MG/ML IV BOLUS
INTRAVENOUS | Status: DC | PRN
  Administered 2021-11-24: 100 mg via INTRAVENOUS

## 2021-11-24 MED ORDER — LACTATED RINGERS IV SOLN: INTRAVENOUS | Status: DC

## 2021-11-24 MED ORDER — PROPOFOL 500 MG/50ML IV EMUL
INTRAVENOUS | Status: DC | PRN
  Administered 2021-11-24: 125 ug/kg/min via INTRAVENOUS

## 2021-11-24 MED ORDER — LIDOCAINE HCL (CARDIAC) PF 100 MG/5ML IV SOSY
PREFILLED_SYRINGE | INTRAVENOUS | Status: DC | PRN
  Administered 2021-11-24: 60 mg via INTRATRACHEAL

## 2021-11-24 MED ORDER — DEXTROSE 50 % IV SOLN
25.0000 g | Freq: Once | INTRAVENOUS | Status: AC
  Administered 2021-11-24: 25 g via INTRAVENOUS

## 2021-11-24 NOTE — Op Note (Signed)
[facility] Patient Name: Nathan Tyler Procedure Date: 11/24/2021 2:45 PM MRN: [id number] Date of Birth: June 09, 1955 Attending MD: Norvel Richards , MD CSN: [id number] Age: 66 Admit Type: Outpatient Procedure:                Colonoscopy Indications:              Chronic diarrhea Providers:                Norvel Richards, MD, Charlsie Quest. Theda Sers RN, RN,                            Rosina Lowenstein, RN, Ladoris Gene, Technician, Randa Spike, Technician Referring MD:              Medicines:                Propofol per Anesthesia Complications:            No immediate complications. Estimated Blood Loss:     Estimated blood loss was minimal. Procedure:                Pre-Anesthesia Assessment:                           - Prior to the procedure, a History and Physical                            was performed, and patient medications and                            allergies were reviewed. The patient's tolerance of                            previous anesthesia was also reviewed. The risks                            and benefits of the procedure and the sedation                            options and risks were discussed with the patient.                            All questions were answered, and informed consent                            was obtained. Prior Anticoagulants: The patient has                            taken no previous anticoagulant or antiplatelet                            agents. ASA Grade Assessment: III - A patient with  severe systemic disease. After reviewing the risks                            and benefits, the patient was deemed in                            satisfactory condition to undergo the procedure.                           After obtaining informed consent, the colonoscope                            was passed under direct vision. Throughout the                            procedure, the patient's  blood pressure, pulse, and                            oxygen saturations were monitored continuously. The                            [id number]) scope was introduced through the                            anus and advanced to the 5 cm into the ileum. The                            colonoscopy was performed without difficulty. The                            patient tolerated the procedure well. The quality                            of the bowel preparation was adequate. Scope In: 3:05:50 PM Scope Out: 3:22:46 PM Scope Withdrawal Time: 0 hours 9 minutes 36 seconds  Total Procedure Duration: 0 hours 16 minutes 56 seconds  Findings:      The perianal and digital rectal examinations were normal. Redundant       elongated colon requiring external abdominal pressure to reach the cecum.      Many small and large-mouthed diverticula were found in the entire colon.      Three sessile polyps were found in the descending colon and hepatic       flexure. The polyps were 3 to 8 mm in size. These polyps were removed       with a cold snare. Resection and retrieval were complete. Estimated       blood loss was minimal.      The exam was otherwise without abnormality on direct and retroflexion       views. Normal distal 5 cm's of TI. Impression:               - Diverticulosis in the entire examined colon.                            Redundant colon. Normal distal TI.                           -  Three 3 to 8 mm polyps in the descending colon                            and at the hepatic flexure, removed with a cold                            snare. Resected and retrieved.                           - The examination was otherwise normal on direct                            and retroflexion views. Patient states Creon has                            not helped. He tells me he is adopted more healthy                            eating habits and his diarrhea has improved on its                             own. Moderate Sedation:      Moderate (conscious) sedation was personally administered by an       anesthesia professional. The following parameters were monitored: oxygen       saturation, heart rate, blood pressure, respiratory rate, EKG, adequacy       of pulmonary ventilation, and response to care. Recommendation:           - Patient has a contact number available for                            emergencies. The signs and symptoms of potential                            delayed complications were discussed with the                            patient. Return to normal activities tomorrow.                            Written discharge instructions were provided to the                            patient.                           - Resume previous diet.                           - Continue present medications.                           - Repeat colonoscopy date to be determined after  pending pathology results are reviewed for                            surveillance.                           - Return to GI office in 3 months. Procedure Code(s):        --- Professional ---                           [phone], Colonoscopy, flexible; with removal of                            tumor(s), polyp(s), or other lesion(s) by snare                            technique Diagnosis Code(s):        --- Professional ---                           K63.5, Polyp of colon                           K52.9, Noninfective gastroenteritis and colitis,                            unspecified                           K57.30, Diverticulosis of large intestine without                            perforation or abscess without bleeding CPT copyright 2019 American Medical Association. All rights reserved. The codes documented in this report are preliminary and upon coder review may  be revised to meet current compliance requirements. [name]. [name], MD Norvel Richards, MD 11/24/2021 3:53:58  PM This report has been signed electronically. Number of Addenda: 0

## 2021-11-24 NOTE — Transfer of Care (Signed)
Immediate Anesthesia Transfer of Care Note  Patient: Nathan Tyler  Procedure(s) Performed: COLONOSCOPY WITH PROPOFOL BIOPSY POLYPECTOMY  Patient Location: Short Stay  Anesthesia Type:General  Level of Consciousness: drowsy  Airway & Oxygen Therapy: Patient Spontanous Breathing and Patient connected to nasal cannula oxygen  Post-op Assessment: Report given to RN and Post -op Vital signs reviewed and stable  Post vital signs: Reviewed and stable  Last Vitals:  Vitals Value Taken Time  BP 94/45   Temp 98   Pulse 57   Resp 16   SpO2 99     Last Pain:  Vitals:   11/24/21 1500  TempSrc:   PainSc: 0-No pain      Patients Stated Pain Goal: 7 ([id number])  Complications: No notable events documented.

## 2021-11-24 NOTE — Anesthesia Postprocedure Evaluation (Signed)
Anesthesia Post Note  Patient: ARROW TOMKO  Procedure(s) Performed: COLONOSCOPY WITH PROPOFOL BIOPSY POLYPECTOMY  Patient location during evaluation: PACU Anesthesia Type: General Level of consciousness: awake and alert Pain management: pain level controlled Vital Signs Assessment: post-procedure vital signs reviewed and stable Respiratory status: spontaneous breathing, nonlabored ventilation, respiratory function stable and patient connected to nasal cannula oxygen Cardiovascular status: blood pressure returned to baseline and stable Postop Assessment: no apparent nausea or vomiting Anesthetic complications: no   No notable events documented.   Last Vitals:  Vitals:   11/24/21 1325  BP: (!) 202/82  Pulse: (!) 56  Resp: 20  Temp: 36.5 C  SpO2: 94%    Last Pain:  Vitals:   11/24/21 1500  TempSrc:   PainSc: 0-No pain                 Louann Sjogren

## 2021-11-24 NOTE — Discharge Instructions (Signed)
  Colonoscopy Discharge Instructions  Read the instructions outlined below and refer to this sheet in the next few weeks. These discharge instructions provide you with general information on caring for yourself after you leave the hospital. Your doctor may also give you specific instructions. While your treatment has been planned according to the most current medical practices available, unavoidable complications occasionally occur. If you have any problems or questions after discharge, call Dr. Gala Romney at [phone]. ACTIVITY You may resume your regular activity, but move at a slower pace for the next 24 hours.  Take frequent rest periods for the next 24 hours.  Walking will help get rid of the air and reduce the bloated feeling in your belly (abdomen).  No driving for 24 hours (because of the medicine (anesthesia) used during the test).   Do not sign any important legal documents or operate any machinery for 24 hours (because of the anesthesia used during the test).  NUTRITION Drink plenty of fluids.  You may resume your normal diet as instructed by your doctor.  Begin with a light meal and progress to your normal diet. Heavy or fried foods are harder to digest and may make you feel sick to your stomach (nauseated).  Avoid alcoholic beverages for 24 hours or as instructed.  MEDICATIONS You may resume your normal medications unless your doctor tells you otherwise.  WHAT YOU CAN EXPECT TODAY Some feelings of bloating in the abdomen.  Passage of more gas than usual.  Spotting of blood in your stool or on the toilet paper.  IF YOU HAD POLYPS REMOVED DURING THE COLONOSCOPY: No aspirin products for 7 days or as instructed.  No alcohol for 7 days or as instructed.  Eat a soft diet for the next 24 hours.  FINDING OUT THE RESULTS OF YOUR TEST Not all test results are available during your visit. If your test results are not back during the visit, make an appointment with your caregiver to find out the  results. Do not assume everything is normal if you have not heard from your caregiver or the medical facility. It is important for you to follow up on all of your test results.  SEEK IMMEDIATE MEDICAL ATTENTION IF: You have more than a spotting of blood in your stool.  Your belly is swollen (abdominal distention).  You are nauseated or vomiting.  You have a temperature over 101.  You have abdominal pain or discomfort that is severe or gets worse throughout the day.    3 polyps removed from your colon  Diverticulosis and polyp information provided  I took samples of your colon to check for other causes of diarrhea as well   continue making healthy choices regarding your diet   further recommendations to follow pending review of pathology report next week  At patient request, I called Suheyb Raucci at [phone] a detailed message.

## 2021-11-24 NOTE — Anesthesia Preprocedure Evaluation (Signed)
Anesthesia Evaluation  Patient identified by MRN, date of birth, ID band Patient awake    Reviewed: Allergy & Precautions, H&P , NPO status , Patient's Chart, lab work & pertinent test results, reviewed documented beta blocker date and time   Airway Mallampati: II  TM Distance: >3 FB Neck ROM: full    Dental no notable dental hx.    Pulmonary neg pulmonary ROS,    Pulmonary exam normal breath sounds clear to auscultation       Cardiovascular Exercise Tolerance: Good hypertension,  Rhythm:regular Rate:Normal     Neuro/Psych  Neuromuscular disease negative psych ROS   GI/Hepatic negative GI ROS, (+) Hepatitis -, C  Endo/Other  diabetesHypothyroidism   Renal/GU CRFRenal disease (s/p kidney tx)  negative genitourinary   Musculoskeletal   Abdominal   Peds  Hematology negative hematology ROS (+)   Anesthesia Other Findings   Reproductive/Obstetrics negative OB ROS                             Anesthesia Physical Anesthesia Plan  ASA: 3  Anesthesia Plan: General   Post-op Pain Management:    Induction:   PONV Risk Score and Plan: Propofol infusion  Airway Management Planned:   Additional Equipment:   Intra-op Plan:   Post-operative Plan:   Informed Consent: I have reviewed the patients History and Physical, chart, labs and discussed the procedure including the risks, benefits and alternatives for the proposed anesthesia with the patient or authorized representative who has indicated his/her understanding and acceptance.     Dental Advisory Given  Plan Discussed with: CRNA  Anesthesia Plan Comments:         Anesthesia Quick Evaluation

## 2021-11-24 NOTE — H&P (Signed)
  Updated note:  11/13/2021 H&P.  Creon has not helped with her diarrhea.  Patient eating more healthfully.  States diarrhea has improved but still persistent.  He is here to further evaluate chronic diarrhea. The risks, benefits, limitations, alternatives and imponderables have been reviewed with the patient. Questions have been answered. All parties are agreeable.

## 2021-11-28 ENCOUNTER — Encounter: Payer: Self-pay | Admitting: Internal Medicine

## 2021-11-28 LAB — SURGICAL PATHOLOGY

## 2021-11-28 NOTE — H&P (Signed)
Updated H&P.  No change.  See 11/15/21 for more information.

## 2021-12-01 ENCOUNTER — Encounter (HOSPITAL_COMMUNITY): Payer: Self-pay | Admitting: Internal Medicine

## 2021-12-07 NOTE — Unmapped (Signed)
[facility] Refill Coordination Note    Specialty Medication(s) to be Shipped:   Neurology: [name]    Other medication(s) to be shipped: No additional medications requested for fill at this time     Gary Rogers, DOB: Aug 03, 1955  Phone: [phone] (home)       All above HIPAA information was verified with patient.     Was a Nurse, learning disability used for this call? No    Completed refill call assessment today to schedule patient's medication shipment from the [facility] [phone]).  All relevant notes have been reviewed.     Specialty medication(s) and dose(s) confirmed: Regimen is correct and unchanged.   Changes to medications: Erikson reports no changes at this time.  Changes to insurance: No  New side effects reported not previously addressed with a pharmacist or physician: None reported  Questions for the pharmacist: No    Confirmed patient received a Conservation officer, historic buildings and a Surveyor, mining with first shipment. The patient will receive a drug information handout for each medication shipped and additional FDA Medication Guides as required.       DISEASE/MEDICATION-SPECIFIC INFORMATION        N/A    SPECIALTY MEDICATION ADHERENCE     Medication Adherence    Patient reported X missed doses in the last month: 0  Specialty Medication: AUSTEDO 6 mg Tab  Patient is on additional specialty medications: No  Patient is on more than two specialty medications: No  Any gaps in refill history greater than 2 weeks in the last 3 months: no  Demonstrates understanding of importance of adherence: yes                          Were doses missed due to medication being on hold? No     AUSTEDO 6 mg Tab - Patient has  8  day supply on hand      REFERRAL TO PHARMACIST     Referral to the pharmacist: Not needed      [facility]     Shipping address confirmed in Epic.     Delivery Scheduled: Yes, Expected medication delivery date: 12/12/21 .     Medication will be delivered via UPS to the prescription address in Epic WAM.    [name]   [facility] Specialty Technician

## 2021-12-11 MED FILL — AUSTEDO 6 MG TABLET: ORAL | 30 days supply | Qty: 60 | Fill #3

## 2021-12-13 DIAGNOSIS — D4989 Neoplasm of unspecified behavior of other specified sites: Secondary | ICD-10-CM | POA: Diagnosis not present

## 2021-12-15 DIAGNOSIS — N179 Acute kidney failure, unspecified: Secondary | ICD-10-CM | POA: Diagnosis not present

## 2021-12-15 DIAGNOSIS — I129 Hypertensive chronic kidney disease with stage 1 through stage 4 chronic kidney disease, or unspecified chronic kidney disease: Secondary | ICD-10-CM | POA: Diagnosis not present

## 2021-12-15 DIAGNOSIS — Z79899 Other long term (current) drug therapy: Secondary | ICD-10-CM | POA: Diagnosis not present

## 2021-12-15 DIAGNOSIS — E872 Acidosis, unspecified: Secondary | ICD-10-CM | POA: Diagnosis not present

## 2021-12-15 DIAGNOSIS — Z940 Kidney transplant status: Secondary | ICD-10-CM | POA: Diagnosis not present

## 2021-12-15 DIAGNOSIS — R197 Diarrhea, unspecified: Secondary | ICD-10-CM | POA: Diagnosis not present

## 2021-12-15 DIAGNOSIS — E1122 Type 2 diabetes mellitus with diabetic chronic kidney disease: Secondary | ICD-10-CM | POA: Diagnosis not present

## 2021-12-15 DIAGNOSIS — D631 Anemia in chronic kidney disease: Secondary | ICD-10-CM | POA: Diagnosis not present

## 2021-12-15 DIAGNOSIS — N183 Chronic kidney disease, stage 3 unspecified: Secondary | ICD-10-CM | POA: Diagnosis not present

## 2021-12-15 DIAGNOSIS — G255 Other chorea: Secondary | ICD-10-CM | POA: Diagnosis not present

## 2021-12-21 ENCOUNTER — Emergency Department (HOSPITAL_COMMUNITY): Payer: Medicare Other

## 2021-12-21 ENCOUNTER — Emergency Department (HOSPITAL_COMMUNITY)
Admission: EM | Admit: 2021-12-21 | Discharge: 2021-12-21 | Disposition: A | Discharge status: Home / Self Care | Payer: Medicare Other | Attending: Emergency Medicine | Admitting: Emergency Medicine

## 2021-12-21 DIAGNOSIS — Z23 Encounter for immunization: Secondary | ICD-10-CM | POA: Diagnosis not present

## 2021-12-21 DIAGNOSIS — Z794 Long term (current) use of insulin: Secondary | ICD-10-CM | POA: Insufficient documentation

## 2021-12-21 DIAGNOSIS — E119 Type 2 diabetes mellitus without complications: Secondary | ICD-10-CM | POA: Insufficient documentation

## 2021-12-21 DIAGNOSIS — S0990XA Unspecified injury of head, initial encounter: Secondary | ICD-10-CM | POA: Insufficient documentation

## 2021-12-21 DIAGNOSIS — I6782 Cerebral ischemia: Secondary | ICD-10-CM | POA: Diagnosis not present

## 2021-12-21 DIAGNOSIS — Z79899 Other long term (current) drug therapy: Secondary | ICD-10-CM | POA: Diagnosis not present

## 2021-12-21 DIAGNOSIS — M19042 Primary osteoarthritis, left hand: Secondary | ICD-10-CM | POA: Insufficient documentation

## 2021-12-21 DIAGNOSIS — W19XXXA Unspecified fall, initial encounter: Secondary | ICD-10-CM

## 2021-12-21 DIAGNOSIS — I10 Essential (primary) hypertension: Secondary | ICD-10-CM | POA: Diagnosis not present

## 2021-12-21 DIAGNOSIS — M5030 Other cervical disc degeneration, unspecified cervical region: Secondary | ICD-10-CM | POA: Diagnosis not present

## 2021-12-21 DIAGNOSIS — S62321A Displaced fracture of shaft of second metacarpal bone, left hand, initial encounter for closed fracture: Secondary | ICD-10-CM | POA: Diagnosis not present

## 2021-12-21 DIAGNOSIS — W109XXA Fall (on) (from) unspecified stairs and steps, initial encounter: Secondary | ICD-10-CM | POA: Diagnosis not present

## 2021-12-21 DIAGNOSIS — E039 Hypothyroidism, unspecified: Secondary | ICD-10-CM | POA: Insufficient documentation

## 2021-12-21 DIAGNOSIS — R110 Nausea: Secondary | ICD-10-CM | POA: Diagnosis not present

## 2021-12-21 DIAGNOSIS — R9431 Abnormal electrocardiogram [ECG] [EKG]: Secondary | ICD-10-CM | POA: Diagnosis not present

## 2021-12-21 LAB — CBG MONITORING, ED: Glucose-Capillary: 121 mg/dL — ABNORMAL HIGH (ref 70–99)

## 2021-12-21 MED ORDER — TETANUS-DIPHTH-ACELL PERTUSSIS 5-2.5-18.5 LF-MCG/0.5 IM SUSY
0.5000 mL | PREFILLED_SYRINGE | Freq: Once | INTRAMUSCULAR | Status: AC
  Administered 2021-12-21: 0.5 mL via INTRAMUSCULAR
  Filled 2021-12-21: qty 0.5

## 2021-12-21 MED ORDER — OXYCODONE HCL 5 MG PO TABS: 5.0000 mg | ORAL_TABLET | Freq: Four times a day (QID) | ORAL | 0 refills | Status: DC | PRN

## 2021-12-21 MED ORDER — OXYCODONE HCL 5 MG PO TABS
5.0000 mg | ORAL_TABLET | ORAL | Status: AC
  Administered 2021-12-21: 5 mg via ORAL
  Filled 2021-12-21: qty 1

## 2021-12-21 MED ORDER — ONDANSETRON HCL 4 MG/2ML IJ SOLN
4.0000 mg | Freq: Once | INTRAMUSCULAR | Status: AC
  Administered 2021-12-21: 4 mg via INTRAVENOUS
  Filled 2021-12-21: qty 2

## 2021-12-21 NOTE — ED Notes (Signed)
Called ortho tech, awaiting splint placement.

## 2021-12-21 NOTE — ED Provider Notes (Signed)
[facility] DEPART[facility] Provider Note   CSN: [id number] Arrival date & time: 12/21/21  1424     History  Chief Complaint  Patient presents with   Fall    Pt fell down 6 carpeted stairs, - LOC, - thinners, + head strike    Nathan Tyler is a 66 y.o. male.  66 year old male with a history of renal transplant as well as diabetes on Trulicity who presents after fall.  Patient states that he was attempting to put on socks while going down a flight of 6 stairs.  Did fall onto his left side.  Has had left hand swelling.  Did have head strike but no LOC.  No significant headache, nausea or vomiting, or neck pain since.  Denies any new numbness or weakness.  Says that he was able to walk and was only down for approximately 30 minutes.  States that he is not on blood thinners.  Denies any chest pain or shortness of breath since.   Fall   Past Medical History:  Diagnosis Date   Chorea    Diabetes mellitus without complication ([place])    Hypercholesteremia    Hypothyroidism    Neuromuscular disorder (HCC)    PONV (postoperative nausea and vomiting)    Renal failure    Has had kidney transplant 2008      Home Medications Prior to Admission medications   Medication Sig Start Date End Date Taking? Authorizing Provider  oxyCODONE (ROXICODONE) 5 MG immediate release tablet Take 1 tablet (5 mg total) by mouth every 6 (six) hours as needed for up to 12 doses for severe pain. 12/21/21  Yes Fransico Meadow, MD  atorvastatin (LIPITOR) 10 MG tablet Take 10 mg by mouth daily.    [provider]  Deutetrabenazine (AUSTEDO) 6 MG TABS Take 6 mg by mouth 2 (two) times daily.    [provider]  Dulaglutide (TRULICITY) 1.5 PI/9.5JO SOPN Inject 1.5 mg into the skin every 7 (seven) days.     [provider]  furosemide (LASIX) 20 MG tablet Take 20 mg by mouth daily.     [provider]  levothyroxine (SYNTHROID) 125 MCG tablet Take 125 mcg  by mouth daily. 10/04/21   [provider]  lipase/protease/amylase (CREON) 36000 UNITS CPEP capsule Take 2 capsules (72,000 Units total) by mouth 3 (three) times daily with meals. Take 1 with snacks 11/15/21   Annitta Needs, NP  mycophenolate (CELLCEPT) 500 MG tablet Take 500 mg by mouth 2 (two) times daily.    [provider]  NOVOLOG 100 UNIT/ML injection SMARTSIG:0-200 Unit(s) SUB-Q Every 3 Days 11/04/21   [provider]  predniSONE (DELTASONE) 5 MG tablet Take 1 tablet (5 mg total) by mouth daily with breakfast. restart '5mg'$  dose 3/10 after completing prednisone taper (2/24 - 3/9) 06/16/19   Patrecia Pour, MD  tacrolimus (PROGRAF) 0.5 MG capsule Take 1.5 mg by mouth 2 (two) times daily.     [provider]      Allergies    Patient has no known allergies.    Review of Systems   Review of Systems  Physical Exam Updated Vital Signs BP (!) 171/84   Pulse (!) 36   Temp 98.2 F (36.8 C)   Resp 19   SpO2 93%  Physical Exam Vitals and nursing note reviewed.  Constitutional:      General: He is not in acute distress.    Appearance: He is well-developed.  HENT:     Head: Normocephalic.     Comments: Abrasion to forehead    Right Ear: External ear normal.     Left Ear: External ear normal.     Nose: Nose normal.  Eyes:     Extraocular Movements: Extraocular movements intact.     Conjunctiva/sclera: Conjunctivae normal.     Pupils: Pupils are equal, round, and reactive to light.  Cardiovascular:     Rate and Rhythm: Normal rate and regular rhythm.     Heart sounds: Normal heart sounds.  Pulmonary:     Effort: Pulmonary effort is normal. No respiratory distress.     Breath sounds: Normal breath sounds.  Abdominal:     General: There is no distension.     Palpations: Abdomen is soft. There is no mass.     Tenderness: There is no abdominal tenderness. There is no guarding.  Musculoskeletal:        General: No swelling.     Cervical back: Normal  range of motion and neck supple.     Right lower leg: No edema.     Left lower leg: No edema.     Comments: Significant swelling of left hand with deformity noted on metacarpal.  Intact sensation to light touch in left hand.  Several abrasions over left upper extremity.  Abrasions on bilateral knees that patient states is old.  Skin:    General: Skin is warm and dry.     Capillary Refill: Capillary refill takes less than 2 seconds.  Neurological:     General: No focal deficit present.     Mental Status: He is alert and oriented to person, place, and time. Mental status is at baseline.  Psychiatric:        Mood and Affect: Mood normal.        Behavior: Behavior normal.     ED Results / Procedures / Treatments   Labs (all labs ordered are listed, but only abnormal results are displayed) Labs Reviewed  CBG MONITORING, ED - Abnormal; Notable for the following components:      Result Value   Glucose-Capillary 121 (*)    All other components within normal limits    EKG EKG Interpretation  Date/Time:  Thursday December 21 2021 14:38:38 EDT Ventricular Rate:  61 PR Interval:  153 QRS Duration: 97 QT Interval:  453 QTC Calculation: 457 R Axis:   -17 Text Interpretation: Sinus rhythm Borderline left axis deviation Abnormal R-wave progression, late transition When compared to 06/06/2019 pt how has poor r-wave progression Confirmed by Margaretmary Eddy [phone]) on 12/21/2021 2:43:49 PM  Radiology DG Hand 2 View Left  Result Date: 12/21/2021 CLINICAL DATA:  Post splinting. EXAM: LEFT HAND - 2 VIEW COMPARISON:  Left hand x-ray 12/21/2021 FINDINGS: There is a new overlying splint. Oblique fracture through the first metacarpal is again seen. Fracture fragments are distracted 3 mm similar to the prior study. There is no angulation or overlap. There is no dislocation. Degenerative changes of the hand are stable. Peripheral vascular calcifications are noted. There is soft tissue swelling over the  dorsum of the hand. IMPRESSION: 1. Acute oblique fracture through the first metacarpal appears similar to the prior exam. There is 3 mm of distraction at the fracture site. Electronically Signed   By: Ronney Asters M.D.   On: 12/21/2021 17:20   CT Head Wo Contrast  Result Date: 12/21/2021 CLINICAL DATA:  Fall EXAM: CT HEAD WITHOUT CONTRAST CT CERVICAL SPINE WITHOUT CONTRAST TECHNIQUE: Multidetector  CT imaging of the head and cervical spine was performed following the standard protocol without intravenous contrast. Multiplanar CT image reconstructions of the cervical spine were also generated. RADIATION DOSE REDUCTION: This exam was performed according to the departmental dose-optimization program which includes automated exposure control, adjustment of the mA and/or kV according to patient size and/or use of iterative reconstruction technique. COMPARISON:  None Available. FINDINGS: CT HEAD FINDINGSz Brain: Chronic white matter ischemic change. No evidence of acute infarction, hemorrhage, hydrocephalus, extra-axial collection or mass lesion/mass effect. Vascular: No hyperdense vessel or unexpected calcification. Skull: Normal. Negative for fracture or focal lesion. Sinuses/Orbits: No acute finding. Other: None. CT CERVICAL SPINE FINDINGS Alignment: Normal. Skull base and vertebrae: No acute fracture. No primary bone lesion or focal pathologic process. Soft tissues and spinal canal: No prevertebral fluid or swelling. No visible canal hematoma. Disc levels: Mild multilevel degenerative disc disease, most pronounced in the lower cervical spine. Upper chest: Negative. Other: None. IMPRESSION: 1. No acute intracranial abnormality. 2. No evidence of acute cervical spine fracture or traumatic malalignment. Electronically Signed   By: Yetta Glassman M.D.   On: 12/21/2021 15:23   CT Cervical Spine Wo Contrast  Result Date: 12/21/2021 CLINICAL DATA:  Fall EXAM: CT HEAD WITHOUT CONTRAST CT CERVICAL SPINE WITHOUT  CONTRAST TECHNIQUE: Multidetector CT imaging of the head and cervical spine was performed following the standard protocol without intravenous contrast. Multiplanar CT image reconstructions of the cervical spine were also generated. RADIATION DOSE REDUCTION: This exam was performed according to the departmental dose-optimization program which includes automated exposure control, adjustment of the mA and/or kV according to patient size and/or use of iterative reconstruction technique. COMPARISON:  None Available. FINDINGS: CT HEAD FINDINGSz Brain: Chronic white matter ischemic change. No evidence of acute infarction, hemorrhage, hydrocephalus, extra-axial collection or mass lesion/mass effect. Vascular: No hyperdense vessel or unexpected calcification. Skull: Normal. Negative for fracture or focal lesion. Sinuses/Orbits: No acute finding. Other: None. CT CERVICAL SPINE FINDINGS Alignment: Normal. Skull base and vertebrae: No acute fracture. No primary bone lesion or focal pathologic process. Soft tissues and spinal canal: No prevertebral fluid or swelling. No visible canal hematoma. Disc levels: Mild multilevel degenerative disc disease, most pronounced in the lower cervical spine. Upper chest: Negative. Other: None. IMPRESSION: 1. No acute intracranial abnormality. 2. No evidence of acute cervical spine fracture or traumatic malalignment. Electronically Signed   By: Yetta Glassman M.D.   On: 12/21/2021 15:23   DG Wrist Complete Left  Result Date: 12/21/2021 CLINICAL DATA:  Left hand deformity status post fall. Base of left index finger pain. Posterior left elbow abrasion and bleeding. EXAM: LEFT WRIST - COMPLETE 3+ VIEW; LEFT HAND - COMPLETE 3+ VIEW; LEFT FOREARM - 2 VIEW COMPARISON:  None Available. FINDINGS: There are extensive vascular calcifications. Left hand: In addition to vascular calcifications, there are additional calcifications along the lateral aspect of the second metacarpophalangeal joint  capsule, likely chronic. Mild interphalangeal joint space narrowing diffusely. Mild triscaphe and thumb carpometacarpal joint space narrowing and peripheral degenerative spurring. There is an oblique fracture of the proximal to mid shaft of the index finger metacarpal with up to approximately 3 mm dorsal displacement of the distal fracture component with respect to the proximal fracture component. Left wrist: In addition to the oblique displaced fracture of the index finger metacarpal shaft, no acute fracture is seen. Mild triscaphe and thumb carpometacarpal joint space narrowing. Neutral ulnar variance. Left forearm: No acute fracture is seen within the radius or ulna. Normal alignment  at the elbow. IMPRESSION: 1. Acute, oblique, mildly displaced fracture of the proximal to mid shaft of the index finger metacarpal. 2. Dense vascular calcifications. 3. Mild triscaphe, thumb carpometacarpal, and interphalangeal joint osteoarthritis. Electronically Signed   By: Yvonne Kendall M.D.   On: 12/21/2021 15:10   DG Hand Complete Left  Result Date: 12/21/2021 CLINICAL DATA:  Left hand deformity status post fall. Base of left index finger pain. Posterior left elbow abrasion and bleeding. EXAM: LEFT WRIST - COMPLETE 3+ VIEW; LEFT HAND - COMPLETE 3+ VIEW; LEFT FOREARM - 2 VIEW COMPARISON:  None Available. FINDINGS: There are extensive vascular calcifications. Left hand: In addition to vascular calcifications, there are additional calcifications along the lateral aspect of the second metacarpophalangeal joint capsule, likely chronic. Mild interphalangeal joint space narrowing diffusely. Mild triscaphe and thumb carpometacarpal joint space narrowing and peripheral degenerative spurring. There is an oblique fracture of the proximal to mid shaft of the index finger metacarpal with up to approximately 3 mm dorsal displacement of the distal fracture component with respect to the proximal fracture component. Left wrist: In addition  to the oblique displaced fracture of the index finger metacarpal shaft, no acute fracture is seen. Mild triscaphe and thumb carpometacarpal joint space narrowing. Neutral ulnar variance. Left forearm: No acute fracture is seen within the radius or ulna. Normal alignment at the elbow. IMPRESSION: 1. Acute, oblique, mildly displaced fracture of the proximal to mid shaft of the index finger metacarpal. 2. Dense vascular calcifications. 3. Mild triscaphe, thumb carpometacarpal, and interphalangeal joint osteoarthritis. Electronically Signed   By: Yvonne Kendall M.D.   On: 12/21/2021 15:10   DG Forearm Left  Result Date: 12/21/2021 CLINICAL DATA:  Left hand deformity status post fall. Base of left index finger pain. Posterior left elbow abrasion and bleeding. EXAM: LEFT WRIST - COMPLETE 3+ VIEW; LEFT HAND - COMPLETE 3+ VIEW; LEFT FOREARM - 2 VIEW COMPARISON:  None Available. FINDINGS: There are extensive vascular calcifications. Left hand: In addition to vascular calcifications, there are additional calcifications along the lateral aspect of the second metacarpophalangeal joint capsule, likely chronic. Mild interphalangeal joint space narrowing diffusely. Mild triscaphe and thumb carpometacarpal joint space narrowing and peripheral degenerative spurring. There is an oblique fracture of the proximal to mid shaft of the index finger metacarpal with up to approximately 3 mm dorsal displacement of the distal fracture component with respect to the proximal fracture component. Left wrist: In addition to the oblique displaced fracture of the index finger metacarpal shaft, no acute fracture is seen. Mild triscaphe and thumb carpometacarpal joint space narrowing. Neutral ulnar variance. Left forearm: No acute fracture is seen within the radius or ulna. Normal alignment at the elbow. IMPRESSION: 1. Acute, oblique, mildly displaced fracture of the proximal to mid shaft of the index finger metacarpal. 2. Dense vascular  calcifications. 3. Mild triscaphe, thumb carpometacarpal, and interphalangeal joint osteoarthritis. Electronically Signed   By: Yvonne Kendall M.D.   On: 12/21/2021 15:10    Procedures Procedures   Medications Ordered in ED Medications  Tdap (BOOSTRIX) injection 0.5 mL (0.5 mLs Intramuscular Given 12/21/21 1525)  ondansetron (ZOFRAN) injection 4 mg (4 mg Intravenous Given 12/21/21 1525)  oxyCODONE (Oxy IR/ROXICODONE) immediate release tablet 5 mg (5 mg Oral Given 12/21/21 1711)    ED Course/ Medical Decision Making/ A&P Clinical Course as of 12/21/21 2121  Thu Dec 21, 2021  1518 DG Hand Complete Left IMPRESSION: 1. Acute, oblique, mildly displaced fracture of the proximal to mid shaft of the index  finger metacarpal. 2. Dense vascular calcifications. 3. Mild triscaphe, thumb carpometacarpal, and interphalangeal joint osteoarthritis. [RP]  1751 Reevaluated the patient.  Neurovascularly intact currently. [RP]    Clinical Course User Index [RP] Fransico Meadow, MD                           Medical Decision Making Amount and/or Complexity of Data Reviewed Radiology: ordered. Decision-making details documented in ED Course.  Risk Prescription drug management.   66 year old male with a history of renal transplant as well as diabetes on Trulicity who presents after fall with head strike and left hand deformity.  Initial Ddx:  ICH, C-spine injury, left metacarpal fracture, left forearm fracture  MDM:  .  The patient had a mechanical fall while going down the stairs.  Given his advanced age we will obtain a CT head and C-spine to evaluate for injury.  Patient does have gross deformity of the left hand that is concerning for a metacarpal fracture.  Also has abrasions of his left arm which is concerning for possible forearm fracture.  Patient was ambulatory at the scene and does not have any leg pain at this time making lower extremity fracture highly unlikely.  Plan:  CT head CT  C-spine X-ray of the left upper extremity and hand  ED Summary:  Patient underwent the above work-up which did show that he had a second digit metacarpal fracture of his left hand.  On repeat exam did not have any rotational deformity that was noted.  The patient was splinted and had postreduction x-rays performed.  Did examine the patient after the splint was placed and he appeared to be neurovascularly intact.  CT head and C-spine did not reveal any acute findings.  Patient was then discharged with instructions to follow-up with a hand surgeon in clinic.  Dispo: DC Home. Return precautions discussed including, but not limited to, those listed in the AVS. Allowed pt time to ask questions which were answered fully prior to dc.   Additional history obtained from significant other Records reviewed OP Notes I independently visualized the following imaging with scope of interpretation limited to determining acute life threatening conditions related to emergency care: CT Head and x-ray of the hand , which revealed midshaft fracture of the second metacarpal without significant angulation or displacement  Final Clinical Impression(s) / ED Diagnoses Final diagnoses:  Fall, initial encounter  Minor head injury, initial encounter  Closed displaced fracture of shaft of second metacarpal bone of left hand, initial encounter    Rx / DC Orders ED Discharge Orders          Ordered    oxyCODONE (ROXICODONE) 5 MG immediate release tablet  Every 6 hours PRN        12/21/21 1703              Fransico Meadow, MD 12/21/21 2121

## 2021-12-21 NOTE — ED Triage Notes (Signed)
[facility] for falling down 6 carpted steps. - thinners, abrasion to top of head, l forearm, bilateral knees, swelling to the left hand. Pt denies losing consciousness.

## 2021-12-21 NOTE — Progress Notes (Signed)
Orthopedic Tech Progress Note Patient Details:  Nathan Tyler 05/03/1955 [id number]  Pt had no complaints of tightness or discomfort from the splint. He was able to move his fingers and sensation was in tact.  Ortho Devices Type of Ortho Device: Rad Gutter splint Ortho Device/Splint Location: LUE Ortho Device/Splint Interventions: Ordered, Application, Adjustment   Post Interventions Patient Tolerated: Well Instructions Provided: Care of device, Adjustment of device  Arville Go 12/21/2021, 5:14 PM

## 2021-12-21 NOTE — Discharge Instructions (Addendum)
Today you were seen in the emergency department for your fall and were found to have a broken hand.    Please take Tylenol at home for the pain that may have.  You may also take oxycodone for any breakthrough pain that you have.  At home, please take care of the splint and follow-up with orthopedics in 1 week.    Follow-up with your primary doctor in 2-3 days regarding your visit.    Return immediately to the emergency department if you experience any of the following: Severe headache, vomiting, confusion, weakness or numbness of your hand, or any other concerning symptoms.    Thank you for visiting our Emergency Department. It was a pleasure taking care of you today.

## 2021-12-21 NOTE — ED Notes (Signed)
Ortho tech at bedside 

## 2021-12-26 DIAGNOSIS — S62301D Unspecified fracture of second metacarpal bone, left hand, subsequent encounter for fracture with routine healing: Secondary | ICD-10-CM | POA: Diagnosis not present

## 2021-12-26 DIAGNOSIS — N184 Chronic kidney disease, stage 4 (severe): Secondary | ICD-10-CM | POA: Diagnosis not present

## 2021-12-26 DIAGNOSIS — I10 Essential (primary) hypertension: Secondary | ICD-10-CM | POA: Diagnosis not present

## 2021-12-26 DIAGNOSIS — Z940 Kidney transplant status: Secondary | ICD-10-CM | POA: Diagnosis not present

## 2021-12-26 DIAGNOSIS — R197 Diarrhea, unspecified: Secondary | ICD-10-CM | POA: Diagnosis not present

## 2021-12-26 DIAGNOSIS — W19XXXD Unspecified fall, subsequent encounter: Secondary | ICD-10-CM | POA: Diagnosis not present

## 2021-12-26 DIAGNOSIS — E669 Obesity, unspecified: Secondary | ICD-10-CM | POA: Diagnosis not present

## 2021-12-26 DIAGNOSIS — E1169 Type 2 diabetes mellitus with other specified complication: Secondary | ICD-10-CM | POA: Diagnosis not present

## 2021-12-26 DIAGNOSIS — I5032 Chronic diastolic (congestive) heart failure: Secondary | ICD-10-CM | POA: Diagnosis not present

## 2021-12-27 ENCOUNTER — Emergency Department (HOSPITAL_COMMUNITY)
Admission: EM | Admit: 2021-12-27 | Discharge: 2021-12-27 | Disposition: A | Discharge status: Home / Self Care | Payer: Medicare Other | Attending: Emergency Medicine | Admitting: Emergency Medicine

## 2021-12-27 ENCOUNTER — Other Ambulatory Visit: Payer: Self-pay

## 2021-12-27 ENCOUNTER — Emergency Department (HOSPITAL_COMMUNITY): Payer: Medicare Other

## 2021-12-27 DIAGNOSIS — R4182 Altered mental status, unspecified: Secondary | ICD-10-CM | POA: Diagnosis not present

## 2021-12-27 DIAGNOSIS — Z940 Kidney transplant status: Secondary | ICD-10-CM | POA: Insufficient documentation

## 2021-12-27 DIAGNOSIS — I10 Essential (primary) hypertension: Secondary | ICD-10-CM | POA: Insufficient documentation

## 2021-12-27 DIAGNOSIS — I6381 Other cerebral infarction due to occlusion or stenosis of small artery: Secondary | ICD-10-CM | POA: Diagnosis not present

## 2021-12-27 DIAGNOSIS — Z79899 Other long term (current) drug therapy: Secondary | ICD-10-CM | POA: Insufficient documentation

## 2021-12-27 DIAGNOSIS — R404 Transient alteration of awareness: Secondary | ICD-10-CM | POA: Diagnosis not present

## 2021-12-27 DIAGNOSIS — E11649 Type 2 diabetes mellitus with hypoglycemia without coma: Secondary | ICD-10-CM | POA: Diagnosis not present

## 2021-12-27 DIAGNOSIS — R55 Syncope and collapse: Secondary | ICD-10-CM | POA: Diagnosis not present

## 2021-12-27 DIAGNOSIS — N289 Disorder of kidney and ureter, unspecified: Secondary | ICD-10-CM | POA: Insufficient documentation

## 2021-12-27 DIAGNOSIS — N179 Acute kidney failure, unspecified: Secondary | ICD-10-CM | POA: Diagnosis not present

## 2021-12-27 DIAGNOSIS — E162 Hypoglycemia, unspecified: Secondary | ICD-10-CM | POA: Diagnosis not present

## 2021-12-27 DIAGNOSIS — R0902 Hypoxemia: Secondary | ICD-10-CM | POA: Diagnosis not present

## 2021-12-27 DIAGNOSIS — E161 Other hypoglycemia: Secondary | ICD-10-CM | POA: Diagnosis not present

## 2021-12-27 DIAGNOSIS — Z794 Long term (current) use of insulin: Secondary | ICD-10-CM | POA: Insufficient documentation

## 2021-12-27 LAB — CBC WITH DIFFERENTIAL/PLATELET
Abs Immature Granulocytes: 0.06 10*3/uL (ref 0.00–0.07)
Basophils Absolute: 0 10*3/uL (ref 0.0–0.1)
Basophils Relative: 0 %
Eosinophils Absolute: 0 10*3/uL (ref 0.0–0.5)
Eosinophils Relative: 0 %
HCT: 31.3 % — ABNORMAL LOW (ref 39.0–52.0)
Hemoglobin: 10 g/dL — ABNORMAL LOW (ref 13.0–17.0)
Immature Granulocytes: 1 %
Lymphocytes Relative: 12 %
Lymphs Abs: 1.6 10*3/uL (ref 0.7–4.0)
MCH: 29.2 pg (ref 26.0–34.0)
MCHC: 31.9 g/dL (ref 30.0–36.0)
MCV: 91.5 fL (ref 80.0–100.0)
Monocytes Absolute: 0.7 10*3/uL (ref 0.1–1.0)
Monocytes Relative: 5 %
Neutro Abs: 10.3 10*3/uL — ABNORMAL HIGH (ref 1.7–7.7)
Neutrophils Relative %: 82 %
Platelets: 255 10*3/uL (ref 150–400)
RBC: 3.42 MIL/uL — ABNORMAL LOW (ref 4.22–5.81)
RDW: 14.4 % (ref 11.5–15.5)
WBC: 12.6 10*3/uL — ABNORMAL HIGH (ref 4.0–10.5)
nRBC: 0 % (ref 0.0–0.2)

## 2021-12-27 LAB — COMPREHENSIVE METABOLIC PANEL
ALT: 11 U/L (ref 0–44)
AST: 18 U/L (ref 15–41)
Albumin: 3.3 g/dL — ABNORMAL LOW (ref 3.5–5.0)
Alkaline Phosphatase: 71 U/L (ref 38–126)
Anion gap: 10 (ref 5–15)
BUN: 89 mg/dL — ABNORMAL HIGH (ref 8–23)
CO2: 19 mmol/L — ABNORMAL LOW (ref 22–32)
Calcium: 8.8 mg/dL — ABNORMAL LOW (ref 8.9–10.3)
Chloride: 111 mmol/L (ref 98–111)
Creatinine, Ser: 3.56 mg/dL — ABNORMAL HIGH (ref 0.61–1.24)
GFR, Estimated: 18 mL/min — ABNORMAL LOW (ref >60)
Glucose, Bld: 150 mg/dL — ABNORMAL HIGH (ref 70–99)
Potassium: 5 mmol/L (ref 3.5–5.1)
Sodium: 140 mmol/L (ref 135–145)
Total Bilirubin: 0.5 mg/dL (ref 0.3–1.2)
Total Protein: 5.7 g/dL — ABNORMAL LOW (ref 6.5–8.1)

## 2021-12-27 LAB — CBG MONITORING, ED
Glucose-Capillary: 141 mg/dL — ABNORMAL HIGH (ref 70–99)
Glucose-Capillary: 119 mg/dL — ABNORMAL HIGH (ref 70–99)

## 2021-12-27 LAB — CK: Total CK: 221 U/L (ref 49–397)

## 2021-12-27 MED ORDER — SODIUM CHLORIDE 0.9 % IV BOLUS
1000.0000 mL | Freq: Once | INTRAVENOUS | Status: AC
  Administered 2021-12-27: 1000 mL via INTRAVENOUS

## 2021-12-27 MED ORDER — SODIUM CHLORIDE 0.9 % IV BOLUS: 1000.0000 mL | Freq: Once | INTRAVENOUS | Status: DC

## 2021-12-27 NOTE — ED Notes (Signed)
Patient placed on bair hugger at this time per provider request.

## 2021-12-27 NOTE — ED Triage Notes (Addendum)
Pt found unresponsive by wife approx. 1hr prior to arrival. PT is Diabetic and took his insulin but hasnt been able to eat much.  CBG was 26 on scene. PT received 25G D10 IV and '1mg'$  Glucagon IM.  CBG 158 on arrival. PT complains of nausea and some dizziness.  Pt received '4mg'$  Zofran from EMS.  PT fell down staircase 3 days ago and sustained left wrist fx and multiple abrasions. Ace wrap on splint was almost entirely unwrapped.  We rewrapped it on arrival. PT also has hx of Orvan Falconer per EMS with L. Facial droop and tremors at baseline.  Pt has hx of HTN.    V/S 207/99 97 RA, HR 60 normal sinus

## 2021-12-27 NOTE — ED Notes (Signed)
Pt transported to [facility]

## 2021-12-27 NOTE — ED Provider Notes (Signed)
[facility] Provider Note   CSN: [id number] Arrival date & time: 12/27/21  1737     History  No chief complaint on file.   Nathan Tyler is a 66 y.o. male history of kidney transplant on CellCept and Prograf, diabetes here presenting with hypoglycemia.  Patient states that home.  Wife had surgery all day and came home and found him altered on the couch.  EMS got there and check his sugar and was 20.  He was given glucagon and Zofran prior to arrival.  His arrival CBG was 150 per EMS.  He was also noted to be hypertensive.  His mental status improved after he was given glucagon.  Patient states that he gave himself 4 units of insulin today.  He states that he only ate a banana.  The history is provided by the patient.       Home Medications Prior to Admission medications   Medication Sig Start Date End Date Taking? Authorizing Provider  atorvastatin (LIPITOR) 10 MG tablet Take 10 mg by mouth daily.    [provider]  Deutetrabenazine (AUSTEDO) 6 MG TABS Take 6 mg by mouth 2 (two) times daily.    [provider]  Dulaglutide (TRULICITY) 1.5 EV/0.3JK SOPN Inject 1.5 mg into the skin every 7 (seven) days.     [provider]  furosemide (LASIX) 20 MG tablet Take 20 mg by mouth daily.     [provider]  levothyroxine (SYNTHROID) 125 MCG tablet Take 125 mcg by mouth daily. 10/04/21   [provider]  lipase/protease/amylase (CREON) 36000 UNITS CPEP capsule Take 2 capsules (72,000 Units total) by mouth 3 (three) times daily with meals. Take 1 with snacks 11/15/21   Annitta Needs, NP  mycophenolate (CELLCEPT) 500 MG tablet Take 500 mg by mouth 2 (two) times daily.    [provider]  NOVOLOG 100 UNIT/ML injection SMARTSIG:0-200 Unit(s) SUB-Q Every 3 Days 11/04/21   [provider]  oxyCODONE (ROXICODONE) 5 MG immediate release tablet Take 1 tablet (5 mg total) by mouth every 6 (six) hours as  needed for up to 12 doses for severe pain. 12/21/21   Fransico Meadow, MD  predniSONE (DELTASONE) 5 MG tablet Take 1 tablet (5 mg total) by mouth daily with breakfast. restart '5mg'$  dose 3/10 after completing prednisone taper (2/24 - 3/9) 06/16/19   Patrecia Pour, MD  tacrolimus (PROGRAF) 0.5 MG capsule Take 1.5 mg by mouth 2 (two) times daily.     [provider]      Allergies    Patient has no known allergies.    Review of Systems   Review of Systems  Psychiatric/Behavioral:  Positive for confusion.   All other systems reviewed and are negative.   Physical Exam Updated Vital Signs BP (!) 165/98   Pulse 63   Temp 98.4 F (36.9 C) (Oral)   Resp (!) 25   SpO2 99%  Physical Exam Vitals and nursing note reviewed.  Constitutional:      Comments: Chronically ill  HENT:     Head: Normocephalic.     Nose: Nose normal.     Mouth/Throat:     Mouth: Mucous membranes are dry.  Eyes:     Extraocular Movements: Extraocular movements intact.     Pupils: Pupils are equal, round, and reactive to light.  Cardiovascular:     Rate and Rhythm: Normal rate and regular rhythm.     Pulses: Normal pulses.  Heart sounds: Normal heart sounds.  Pulmonary:     Effort: Pulmonary effort is normal.     Breath sounds: Normal breath sounds.  Abdominal:     General: Abdomen is flat.     Palpations: Abdomen is soft.  Musculoskeletal:        General: Normal range of motion.     Cervical back: Normal range of motion and neck supple.     Comments: Left wrist with splint in place.  Patient had a recent wrist fracture.  Patient is able to wiggle fingers with normal capillary refill  Skin:    General: Skin is warm.     Capillary Refill: Capillary refill takes less than 2 seconds.  Neurological:     General: No focal deficit present.     Mental Status: He is oriented to person, place, and time.     Comments: Awake and alert and normal strength and sensation bilateral arms and legs   Psychiatric:        Mood and Affect: Mood normal.        Behavior: Behavior normal.     ED Results / Procedures / Treatments   Labs (all labs ordered are listed, but only abnormal results are displayed) Labs Reviewed  CBC WITH DIFFERENTIAL/PLATELET - Abnormal; Notable for the following components:      Result Value   WBC 12.6 (*)    RBC 3.42 (*)    Hemoglobin 10.0 (*)    HCT 31.3 (*)    Neutro Abs 10.3 (*)    All other components within normal limits  COMPREHENSIVE METABOLIC PANEL - Abnormal; Notable for the following components:   CO2 19 (*)    Glucose, Bld 150 (*)    BUN 89 (*)    Creatinine, Ser 3.56 (*)    Calcium 8.8 (*)    Total Protein 5.7 (*)    Albumin 3.3 (*)    GFR, Estimated 18 (*)    All other components within normal limits  CBG MONITORING, ED - Abnormal; Notable for the following components:   Glucose-Capillary 141 (*)    All other components within normal limits  CK  URINALYSIS, ROUTINE W REFLEX MICROSCOPIC  CBG MONITORING, ED    EKG None  Radiology CT HEAD WO CONTRAST (5MM)  Result Date: 12/27/2021 CLINICAL DATA:  Trauma, fall EXAM: CT HEAD WITHOUT CONTRAST TECHNIQUE: Contiguous axial images were obtained from the base of the skull through the vertex without intravenous contrast. RADIATION DOSE REDUCTION: This exam was performed according to the departmental dose-optimization program which includes automated exposure control, adjustment of the mA and/or kV according to patient size and/or use of iterative reconstruction technique. COMPARISON:  12/21/2021 FINDINGS: Brain: No acute intracranial findings are seen. There are no signs of bleeding within the cranium. Cortical sulci are prominent. There is decreased density in periventricular white matter. Old lacunar infarcts are seen in basal ganglia. Vascular: Scattered arterial calcifications are seen. Skull: No fracture is seen in the calvarium. Fairly extensive arterial calcifications are seen in the scalp.  Sinuses/Orbits: There is mucosal thickening in both maxillary sinuses and ethmoid sinus. Other: None. IMPRESSION: No acute intracranial findings are seen. Atrophy. Small vessel disease. Chronic ethmoid and maxillary sinusitis. Electronically Signed   By: Elmer Picker M.D.   On: 12/27/2021 18:51    Procedures Procedures    Medications Ordered in ED Medications  sodium chloride 0.9 % bolus 1,000 mL (has no administration in time range)    ED Course/ Medical Decision Making/  A&P                           Medical Decision Making DEONTRE ALLSUP is a 65 y.o. male here presenting with confusion and hypoglycemia.  I think his hypoglycemia likely secondary to not eating and using insulin.  Patient appears dehydrated as well.  Plan to get CBC and CMP and hydrate patient.  We will also get CT head given that he may have had head injury and altered mental status to rule out intracranial hemorrhage.   10:14 PM Patient was observed for multiple hours in the ED.  Patient's blood sugar maintained over 100.  I reviewed patient's labs.  His creatinine is 3.5.  On care everywhere, his recent creatinine was 3.9.  He has a transplanted kidney and per his wife, his creatinine is usually between 3 and 4.  Patient became briefly hypothermic which I think is from the hypoglycemia.  Patient was able to eat eat some food and blood sugar is maintained and his temperature went up to 97.5.  I told him to stay hydrated and eat normally.  Stable for discharge at this point.  Problems Addressed: Hypoglycemia: acute illness or injury Renal insufficiency: acute illness or injury  Amount and/or Complexity of Data Reviewed Labs: ordered. Decision-making details documented in ED Course. Radiology: ordered and independent interpretation performed. Decision-making details documented in ED Course.    Final Clinical Impression(s) / ED Diagnoses Final diagnoses:  None    Rx / DC Orders ED Discharge Orders     None          Drenda Freeze, MD 12/27/21 2216

## 2021-12-27 NOTE — Discharge Instructions (Addendum)
You need to eat normally so your blood sugar does not drop.  As we discussed, your creatinine is 3.5 right now and your ultrasound of your transplanted kidney showed that it is functioning normally  You need to follow-up with your kidney doctor and primary care doctor  Return to ER if you have another episode of glucose less than 60 or greater than 400 or lethargy

## 2021-12-27 NOTE — ED Notes (Signed)
DC instructions reviewed with pt. PT verbalized understanding. PT DC °

## 2021-12-28 DIAGNOSIS — M79642 Pain in left hand: Secondary | ICD-10-CM | POA: Diagnosis not present

## 2021-12-29 DIAGNOSIS — I10 Essential (primary) hypertension: Secondary | ICD-10-CM | POA: Diagnosis not present

## 2021-12-29 DIAGNOSIS — R0689 Other abnormalities of breathing: Secondary | ICD-10-CM | POA: Diagnosis not present

## 2021-12-29 DIAGNOSIS — R404 Transient alteration of awareness: Secondary | ICD-10-CM | POA: Diagnosis not present

## 2021-12-29 DIAGNOSIS — E161 Other hypoglycemia: Secondary | ICD-10-CM | POA: Diagnosis not present

## 2021-12-29 DIAGNOSIS — E162 Hypoglycemia, unspecified: Secondary | ICD-10-CM | POA: Diagnosis not present

## 2022-01-02 ENCOUNTER — Encounter (HOSPITAL_COMMUNITY)
Admission: RE | Admit: 2022-01-02 | Discharge: 2022-01-02 | Disposition: A | Discharge status: Home / Self Care | Payer: Medicare Other | Source: Ambulatory Visit | Attending: Nephrology | Admitting: Nephrology

## 2022-01-02 DIAGNOSIS — N183 Chronic kidney disease, stage 3 unspecified: Secondary | ICD-10-CM

## 2022-01-02 MED ORDER — EPOETIN ALFA 20000 UNIT/ML IJ SOLN: 20000.0000 [IU] | Freq: Once | INTRAMUSCULAR | Status: DC

## 2022-01-03 NOTE — Unmapped (Signed)
[facility] Refill Coordination Note    Specialty Medication(s) to be Shipped:   Neurology: [name]    Other medication(s) to be shipped: No additional medications requested for fill at this time     Gary Rogers, DOB: 01/25/1956  Phone: [phone] (home)       All above HIPAA information was verified with patient.     Was a Nurse, learning disability used for this call? No    Completed refill call assessment today to schedule patient's medication shipment from the [facility] [phone]).  All relevant notes have been reviewed.     Specialty medication(s) and dose(s) confirmed: Regimen is correct and unchanged.   Changes to medications: [name] reports no changes at this time.  Changes to insurance: No  New side effects reported not previously addressed with a pharmacist or physician: None reported  Questions for the pharmacist: No    Confirmed patient received a Conservation officer, historic buildings and a Surveyor, mining with first shipment. The patient will receive a drug information handout for each medication shipped and additional FDA Medication Guides as required.       DISEASE/MEDICATION-SPECIFIC INFORMATION        N/A    SPECIALTY MEDICATION ADHERENCE     Medication Adherence    Patient reported X missed doses in the last month: 2  Specialty Medication: Austedo 6mg   Patient is on additional specialty medications: No  Patient is on more than two specialty medications: No  Any gaps in refill history greater than 2 weeks in the last 3 months: no  Demonstrates understanding of importance of adherence: yes                          Were doses missed due to medication being on hold? No     AUSTEDO 6 mg Tab - Patient has  15 day supply on hand      REFERRAL TO PHARMACIST     Referral to the pharmacist: Not needed      [facility]     Shipping address confirmed in Epic.     Delivery Scheduled: Yes, Expected medication delivery date: 01/12/22 .     Medication will be delivered via UPS to the prescription address in Epic WAM.    [name]   [facility] Specialty Technician

## 2022-01-11 MED FILL — AUSTEDO 6 MG TABLET: ORAL | 30 days supply | Qty: 60 | Fill #4

## 2022-01-22 DIAGNOSIS — I120 Hypertensive chronic kidney disease with stage 5 chronic kidney disease or end stage renal disease: Secondary | ICD-10-CM | POA: Diagnosis not present

## 2022-01-22 DIAGNOSIS — T8611 Kidney transplant rejection: Secondary | ICD-10-CM | POA: Diagnosis not present

## 2022-01-22 DIAGNOSIS — N184 Chronic kidney disease, stage 4 (severe): Secondary | ICD-10-CM | POA: Diagnosis not present

## 2022-01-22 DIAGNOSIS — N186 End stage renal disease: Secondary | ICD-10-CM | POA: Diagnosis not present

## 2022-01-22 DIAGNOSIS — Z79899 Other long term (current) drug therapy: Secondary | ICD-10-CM | POA: Diagnosis not present

## 2022-01-22 DIAGNOSIS — Z940 Kidney transplant status: Secondary | ICD-10-CM | POA: Diagnosis not present

## 2022-01-22 DIAGNOSIS — N049 Nephrotic syndrome with unspecified morphologic changes: Secondary | ICD-10-CM | POA: Diagnosis not present

## 2022-01-29 ENCOUNTER — Ambulatory Visit: Payer: Medicare Other | Admitting: Internal Medicine

## 2022-01-30 DIAGNOSIS — E1142 Type 2 diabetes mellitus with diabetic polyneuropathy: Secondary | ICD-10-CM | POA: Diagnosis not present

## 2022-01-30 DIAGNOSIS — B351 Tinea unguium: Secondary | ICD-10-CM | POA: Diagnosis not present

## 2022-02-05 ENCOUNTER — Ambulatory Visit: Payer: Medicare Other | Admitting: Internal Medicine

## 2022-02-06 DIAGNOSIS — E1122 Type 2 diabetes mellitus with diabetic chronic kidney disease: Secondary | ICD-10-CM | POA: Diagnosis not present

## 2022-02-06 DIAGNOSIS — Z940 Kidney transplant status: Secondary | ICD-10-CM | POA: Diagnosis not present

## 2022-02-06 DIAGNOSIS — N189 Chronic kidney disease, unspecified: Secondary | ICD-10-CM | POA: Diagnosis not present

## 2022-02-06 DIAGNOSIS — R197 Diarrhea, unspecified: Secondary | ICD-10-CM | POA: Diagnosis not present

## 2022-02-06 DIAGNOSIS — E872 Acidosis, unspecified: Secondary | ICD-10-CM | POA: Diagnosis not present

## 2022-02-06 DIAGNOSIS — I129 Hypertensive chronic kidney disease with stage 1 through stage 4 chronic kidney disease, or unspecified chronic kidney disease: Secondary | ICD-10-CM | POA: Diagnosis not present

## 2022-02-06 DIAGNOSIS — Z79899 Other long term (current) drug therapy: Secondary | ICD-10-CM | POA: Diagnosis not present

## 2022-02-06 DIAGNOSIS — G255 Other chorea: Secondary | ICD-10-CM | POA: Diagnosis not present

## 2022-02-06 DIAGNOSIS — N179 Acute kidney failure, unspecified: Secondary | ICD-10-CM | POA: Diagnosis not present

## 2022-02-06 DIAGNOSIS — N183 Chronic kidney disease, stage 3 unspecified: Secondary | ICD-10-CM | POA: Diagnosis not present

## 2022-02-06 DIAGNOSIS — D631 Anemia in chronic kidney disease: Secondary | ICD-10-CM | POA: Diagnosis not present

## 2022-02-09 NOTE — Unmapped (Signed)
[facility] Refill Coordination Note    Specialty Medication(s) to be Shipped:   Neurology: Gary Rogers    Other medication(s) to be shipped: No additional medications requested for fill at this time     Gary Rogers, DOB: Jun 28, 1955  Phone: [phone] (home)       All above HIPAA information was verified with patient.     Was a Nurse, learning disability used for this call? No    Completed refill call assessment today to schedule patient's medication shipment from the [facility] [phone]).  All relevant notes have been reviewed.     Specialty medication(s) and dose(s) confirmed: Regimen is correct and unchanged.   Changes to medications: Gary Rogers reports no changes at this time.  Changes to insurance: No  New side effects reported not previously addressed with a pharmacist or physician: None reported  Questions for the pharmacist: No    Confirmed patient received a Conservation officer, historic buildings and a Surveyor, mining with first shipment. The patient will receive a drug information handout for each medication shipped and additional FDA Medication Guides as required.       DISEASE/MEDICATION-SPECIFIC INFORMATION        N/A    SPECIALTY MEDICATION ADHERENCE     Medication Adherence    Patient reported X missed doses in the last month: 0  Specialty Medication: Austedo  6mg   Patient is on additional specialty medications: No                          Were doses missed due to medication being on hold? No    Austedo 6 mg: 12 days of medicine on hand        REFERRAL TO PHARMACIST     Referral to the pharmacist: Not needed      [facility]     Shipping address confirmed in Epic.     Delivery Scheduled: Yes, Expected medication delivery date: 02/15/22.     Medication will be delivered via UPS to the prescription address in Epic WAM.    Gary Rogers   [facility] Specialty Technician

## 2022-02-13 ENCOUNTER — Ambulatory Visit: Payer: Medicare Other | Admitting: Internal Medicine

## 2022-02-13 ENCOUNTER — Encounter: Payer: Self-pay | Admitting: Internal Medicine

## 2022-02-14 MED FILL — AUSTEDO 6 MG TABLET: ORAL | 30 days supply | Qty: 60 | Fill #5

## 2022-02-17 DIAGNOSIS — E162 Hypoglycemia, unspecified: Secondary | ICD-10-CM | POA: Diagnosis not present

## 2022-02-17 DIAGNOSIS — R410 Disorientation, unspecified: Secondary | ICD-10-CM | POA: Diagnosis not present

## 2022-02-17 DIAGNOSIS — R0902 Hypoxemia: Secondary | ICD-10-CM | POA: Diagnosis not present

## 2022-02-17 DIAGNOSIS — E161 Other hypoglycemia: Secondary | ICD-10-CM | POA: Diagnosis not present

## 2022-02-17 DIAGNOSIS — R0689 Other abnormalities of breathing: Secondary | ICD-10-CM | POA: Diagnosis not present

## 2022-02-20 ENCOUNTER — Inpatient Hospital Stay (HOSPITAL_COMMUNITY): Admission: RE | Admit: 2022-02-20 | Payer: Medicare Other | Source: Ambulatory Visit

## 2022-02-20 ENCOUNTER — Encounter (HOSPITAL_COMMUNITY): Payer: Self-pay

## 2022-02-20 DIAGNOSIS — E039 Hypothyroidism, unspecified: Secondary | ICD-10-CM | POA: Diagnosis not present

## 2022-02-20 DIAGNOSIS — E1169 Type 2 diabetes mellitus with other specified complication: Secondary | ICD-10-CM | POA: Diagnosis not present

## 2022-02-20 DIAGNOSIS — Z940 Kidney transplant status: Secondary | ICD-10-CM | POA: Diagnosis not present

## 2022-02-20 DIAGNOSIS — D509 Iron deficiency anemia, unspecified: Secondary | ICD-10-CM | POA: Diagnosis not present

## 2022-02-21 ENCOUNTER — Inpatient Hospital Stay (HOSPITAL_COMMUNITY)
Admission: EM | Admit: 2022-02-21 | Discharge: 2022-02-28 | DRG: 193 | Disposition: A | Discharge status: Home Health | Payer: Medicare Other | Attending: Internal Medicine | Admitting: Internal Medicine

## 2022-02-21 ENCOUNTER — Emergency Department (HOSPITAL_COMMUNITY): Payer: Medicare Other

## 2022-02-21 ENCOUNTER — Inpatient Hospital Stay (HOSPITAL_COMMUNITY): Payer: Medicare Other

## 2022-02-21 DIAGNOSIS — I10 Essential (primary) hypertension: Secondary | ICD-10-CM | POA: Diagnosis not present

## 2022-02-21 DIAGNOSIS — R0902 Hypoxemia: Secondary | ICD-10-CM | POA: Diagnosis not present

## 2022-02-21 DIAGNOSIS — Z79899 Other long term (current) drug therapy: Secondary | ICD-10-CM | POA: Diagnosis not present

## 2022-02-21 DIAGNOSIS — E7800 Pure hypercholesterolemia, unspecified: Secondary | ICD-10-CM | POA: Diagnosis present

## 2022-02-21 DIAGNOSIS — Z9641 Presence of insulin pump (external) (internal): Secondary | ICD-10-CM | POA: Diagnosis present

## 2022-02-21 DIAGNOSIS — Z89422 Acquired absence of other left toe(s): Secondary | ICD-10-CM

## 2022-02-21 DIAGNOSIS — N179 Acute kidney failure, unspecified: Secondary | ICD-10-CM | POA: Diagnosis not present

## 2022-02-21 DIAGNOSIS — J90 Pleural effusion, not elsewhere classified: Secondary | ICD-10-CM | POA: Diagnosis not present

## 2022-02-21 DIAGNOSIS — D649 Anemia, unspecified: Secondary | ICD-10-CM | POA: Diagnosis not present

## 2022-02-21 DIAGNOSIS — G9341 Metabolic encephalopathy: Secondary | ICD-10-CM | POA: Diagnosis present

## 2022-02-21 DIAGNOSIS — E875 Hyperkalemia: Secondary | ICD-10-CM | POA: Diagnosis not present

## 2022-02-21 DIAGNOSIS — J9811 Atelectasis: Secondary | ICD-10-CM | POA: Diagnosis not present

## 2022-02-21 DIAGNOSIS — Z940 Kidney transplant status: Secondary | ICD-10-CM

## 2022-02-21 DIAGNOSIS — E039 Hypothyroidism, unspecified: Secondary | ICD-10-CM | POA: Diagnosis present

## 2022-02-21 DIAGNOSIS — I5032 Chronic diastolic (congestive) heart failure: Secondary | ICD-10-CM | POA: Diagnosis present

## 2022-02-21 DIAGNOSIS — Z9842 Cataract extraction status, left eye: Secondary | ICD-10-CM

## 2022-02-21 DIAGNOSIS — D84821 Immunodeficiency due to drugs: Secondary | ICD-10-CM | POA: Diagnosis present

## 2022-02-21 DIAGNOSIS — Z20822 Contact with and (suspected) exposure to covid-19: Secondary | ICD-10-CM | POA: Diagnosis present

## 2022-02-21 DIAGNOSIS — I130 Hypertensive heart and chronic kidney disease with heart failure and stage 1 through stage 4 chronic kidney disease, or unspecified chronic kidney disease: Secondary | ICD-10-CM | POA: Diagnosis present

## 2022-02-21 DIAGNOSIS — E11649 Type 2 diabetes mellitus with hypoglycemia without coma: Secondary | ICD-10-CM | POA: Diagnosis present

## 2022-02-21 DIAGNOSIS — Z79890 Hormone replacement therapy (postmenopausal): Secondary | ICD-10-CM

## 2022-02-21 DIAGNOSIS — Z9841 Cataract extraction status, right eye: Secondary | ICD-10-CM | POA: Diagnosis not present

## 2022-02-21 DIAGNOSIS — R0600 Dyspnea, unspecified: Secondary | ICD-10-CM | POA: Diagnosis not present

## 2022-02-21 DIAGNOSIS — E162 Hypoglycemia, unspecified: Secondary | ICD-10-CM

## 2022-02-21 DIAGNOSIS — R062 Wheezing: Secondary | ICD-10-CM | POA: Diagnosis not present

## 2022-02-21 DIAGNOSIS — E119 Type 2 diabetes mellitus without complications: Secondary | ICD-10-CM

## 2022-02-21 DIAGNOSIS — E876 Hypokalemia: Secondary | ICD-10-CM | POA: Diagnosis present

## 2022-02-21 DIAGNOSIS — J9601 Acute respiratory failure with hypoxia: Secondary | ICD-10-CM | POA: Diagnosis present

## 2022-02-21 DIAGNOSIS — I491 Atrial premature depolarization: Secondary | ICD-10-CM | POA: Diagnosis not present

## 2022-02-21 DIAGNOSIS — Z7960 Long term (current) use of unspecified immunomodulators and immunosuppressants: Secondary | ICD-10-CM | POA: Diagnosis not present

## 2022-02-21 DIAGNOSIS — I6381 Other cerebral infarction due to occlusion or stenosis of small artery: Secondary | ICD-10-CM | POA: Diagnosis not present

## 2022-02-21 DIAGNOSIS — N184 Chronic kidney disease, stage 4 (severe): Secondary | ICD-10-CM | POA: Diagnosis present

## 2022-02-21 DIAGNOSIS — D631 Anemia in chronic kidney disease: Secondary | ICD-10-CM | POA: Diagnosis present

## 2022-02-21 DIAGNOSIS — E872 Acidosis, unspecified: Secondary | ICD-10-CM | POA: Diagnosis present

## 2022-02-21 DIAGNOSIS — Z794 Long term (current) use of insulin: Secondary | ICD-10-CM

## 2022-02-21 DIAGNOSIS — J189 Pneumonia, unspecified organism: Principal | ICD-10-CM

## 2022-02-21 DIAGNOSIS — R4182 Altered mental status, unspecified: Secondary | ICD-10-CM | POA: Diagnosis not present

## 2022-02-21 DIAGNOSIS — E1122 Type 2 diabetes mellitus with diabetic chronic kidney disease: Secondary | ICD-10-CM | POA: Diagnosis present

## 2022-02-21 DIAGNOSIS — R0602 Shortness of breath: Secondary | ICD-10-CM | POA: Diagnosis not present

## 2022-02-21 DIAGNOSIS — E161 Other hypoglycemia: Secondary | ICD-10-CM | POA: Diagnosis not present

## 2022-02-21 HISTORY — DX: Essential (primary) hypertension: I10

## 2022-02-21 LAB — COMPREHENSIVE METABOLIC PANEL
ALT: 15 U/L (ref 0–44)
AST: 16 U/L (ref 15–41)
Albumin: 3.3 g/dL — ABNORMAL LOW (ref 3.5–5.0)
Alkaline Phosphatase: 72 U/L (ref 38–126)
Anion gap: 10 (ref 5–15)
BUN: 88 mg/dL — ABNORMAL HIGH (ref 8–23)
CO2: 16 mmol/L — ABNORMAL LOW (ref 22–32)
Calcium: 8.1 mg/dL — ABNORMAL LOW (ref 8.9–10.3)
Chloride: 121 mmol/L — ABNORMAL HIGH (ref 98–111)
Creatinine, Ser: 4.38 mg/dL — ABNORMAL HIGH (ref 0.61–1.24)
GFR, Estimated: 14 mL/min — ABNORMAL LOW (ref >60)
Glucose, Bld: 92 mg/dL (ref 70–99)
Potassium: 5.6 mmol/L — ABNORMAL HIGH (ref 3.5–5.1)
Sodium: 147 mmol/L — ABNORMAL HIGH (ref 135–145)
Total Bilirubin: 0.4 mg/dL (ref 0.3–1.2)
Total Protein: 5.4 g/dL — ABNORMAL LOW (ref 6.5–8.1)

## 2022-02-21 LAB — CBC WITH DIFFERENTIAL/PLATELET
Abs Immature Granulocytes: 0.32 10*3/uL — ABNORMAL HIGH (ref 0.00–0.07)
Basophils Absolute: 0 10*3/uL (ref 0.0–0.1)
Basophils Relative: 0 %
Eosinophils Absolute: 0.1 10*3/uL (ref 0.0–0.5)
Eosinophils Relative: 1 %
HCT: 26.4 % — ABNORMAL LOW (ref 39.0–52.0)
Hemoglobin: 7.8 g/dL — ABNORMAL LOW (ref 13.0–17.0)
Immature Granulocytes: 4 %
Lymphocytes Relative: 30 %
Lymphs Abs: 2.3 10*3/uL (ref 0.7–4.0)
MCH: 29.1 pg (ref 26.0–34.0)
MCHC: 29.5 g/dL — ABNORMAL LOW (ref 30.0–36.0)
MCV: 98.5 fL (ref 80.0–100.0)
Monocytes Absolute: 0.6 10*3/uL (ref 0.1–1.0)
Monocytes Relative: 8 %
Neutro Abs: 4.5 10*3/uL (ref 1.7–7.7)
Neutrophils Relative %: 57 %
Platelets: 274 10*3/uL (ref 150–400)
RBC: 2.68 MIL/uL — ABNORMAL LOW (ref 4.22–5.81)
RDW: 16.2 % — ABNORMAL HIGH (ref 11.5–15.5)
WBC: 7.9 10*3/uL (ref 4.0–10.5)
nRBC: 0 % (ref 0.0–0.2)

## 2022-02-21 LAB — MAGNESIUM: Magnesium: 2.3 mg/dL (ref 1.7–2.4)

## 2022-02-21 LAB — I-STAT VENOUS BLOOD GAS, ED
Acid-base deficit: 13 mmol/L — ABNORMAL HIGH (ref 0.0–2.0)
Bicarbonate: 14.4 mmol/L — ABNORMAL LOW (ref 20.0–28.0)
Calcium, Ion: 1.13 mmol/L — ABNORMAL LOW (ref 1.15–1.40)
HCT: 20 % — ABNORMAL LOW (ref 39.0–52.0)
Hemoglobin: 6.8 g/dL — CL (ref 13.0–17.0)
O2 Saturation: 92 %
Potassium: 5.1 mmol/L (ref 3.5–5.1)
Sodium: 145 mmol/L (ref 135–145)
TCO2: 16 mmol/L — ABNORMAL LOW (ref 22–32)
pCO2, Ven: 39.2 mmHg — ABNORMAL LOW (ref 44–60)
pH, Ven: 7.174 — CL (ref 7.25–7.43)
pO2, Ven: 81 mmHg — ABNORMAL HIGH (ref 32–45)

## 2022-02-21 LAB — TSH: TSH: 4.644 u[IU]/mL — ABNORMAL HIGH (ref 0.350–4.500)

## 2022-02-21 LAB — CBG MONITORING, ED: Glucose-Capillary: 82 mg/dL (ref 70–99)

## 2022-02-21 LAB — TROPONIN I (HIGH SENSITIVITY): Troponin I (High Sensitivity): 16 ng/L (ref <18)

## 2022-02-21 LAB — BRAIN NATRIURETIC PEPTIDE: B Natriuretic Peptide: 1342 pg/mL — ABNORMAL HIGH (ref 0.0–100.0)

## 2022-02-21 LAB — LACTIC ACID, PLASMA: Lactic Acid, Venous: 0.5 mmol/L (ref 0.5–1.9)

## 2022-02-21 MED ORDER — METHYLPREDNISOLONE SODIUM SUCC 40 MG IJ SOLR
40.0000 mg | Freq: Once | INTRAMUSCULAR | Status: AC
  Administered 2022-02-21: 40 mg via INTRAVENOUS
  Filled 2022-02-21: qty 1

## 2022-02-21 MED ORDER — METHYLPREDNISOLONE SODIUM SUCC 125 MG IJ SOLR: 60.0000 mg | Freq: Once | INTRAMUSCULAR | Status: DC

## 2022-02-21 MED ORDER — ACETAMINOPHEN 325 MG PO TABS: 650.0000 mg | ORAL_TABLET | Freq: Four times a day (QID) | ORAL | Status: DC | PRN

## 2022-02-21 MED ORDER — INSULIN ASPART 100 UNIT/ML IV SOLN
5.0000 [IU] | Freq: Once | INTRAVENOUS | Status: AC
  Administered 2022-02-21: 5 [IU] via INTRAVENOUS

## 2022-02-21 MED ORDER — LACTATED RINGERS IV BOLUS: 1000.0000 mL | Freq: Once | INTRAVENOUS | Status: DC

## 2022-02-21 MED ORDER — SODIUM CHLORIDE 0.9 % IV SOLN
1.0000 g | INTRAVENOUS | Status: DC
  Administered 2022-02-22 – 2022-02-23 (×2): 1 g via INTRAVENOUS
  Filled 2022-02-21 (×2): qty 10

## 2022-02-21 MED ORDER — LACTATED RINGERS IV BOLUS
500.0000 mL | Freq: Once | INTRAVENOUS | Status: AC
  Administered 2022-02-21: 500 mL via INTRAVENOUS

## 2022-02-21 MED ORDER — SODIUM CHLORIDE 0.9 % IV SOLN
500.0000 mg | INTRAVENOUS | Status: DC
  Administered 2022-02-22 – 2022-02-23 (×2): 500 mg via INTRAVENOUS
  Filled 2022-02-21 (×2): qty 5

## 2022-02-21 MED ORDER — SODIUM CHLORIDE 0.9 % IV SOLN
1.0000 g | Freq: Once | INTRAVENOUS | Status: AC
  Administered 2022-02-21: 1 g via INTRAVENOUS
  Filled 2022-02-21: qty 10

## 2022-02-21 MED ORDER — SODIUM ZIRCONIUM CYCLOSILICATE 10 G PO PACK
10.0000 g | PACK | Freq: Once | ORAL | Status: DC
  Filled 2022-02-21: qty 1

## 2022-02-21 MED ORDER — ACETAMINOPHEN 650 MG RE SUPP: 650.0000 mg | Freq: Four times a day (QID) | RECTAL | Status: DC | PRN

## 2022-02-21 MED ORDER — ALBUTEROL SULFATE (2.5 MG/3ML) 0.083% IN NEBU
5.0000 mg | INHALATION_SOLUTION | Freq: Once | RESPIRATORY_TRACT | Status: AC
  Administered 2022-02-21: 5 mg via RESPIRATORY_TRACT
  Filled 2022-02-21: qty 6

## 2022-02-21 MED ORDER — DEXTROSE 50 % IV SOLN
1.0000 | Freq: Once | INTRAVENOUS | Status: AC
  Administered 2022-02-21: 50 mL via INTRAVENOUS
  Filled 2022-02-21: qty 50

## 2022-02-21 MED ORDER — SODIUM CHLORIDE 0.9 % IV SOLN
500.0000 mg | Freq: Once | INTRAVENOUS | Status: AC
  Administered 2022-02-21: 500 mg via INTRAVENOUS
  Filled 2022-02-21: qty 5
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
## 2022-02-21 NOTE — ED Provider Notes (Signed)
[facility] EMERGENCY DEPARTMENT Provider Note   CSN: [id number] Arrival date & time:        History  Chief Complaint  Patient presents with   Shortness of Breath    Nathan Tyler is a 66 y.o. male.  HPI Patient presents for shortness of breath.  Medical history includes HTN, hypothyroidism, CKD, renal transplant in 2010, diabetes.  Wife noticed that he seemed lethargic.  EMS was called to the scene.  Initial responders noted a glucose of 47.  He was given glucose replacement with return of normal glycemia.  He was found to have labored breathing and hypoxia on scene.  He is not on oxygen at baseline.  Initial SPO2 on room air was in the 70s.  He was placed on a nonrebreather.  Currently, patient denies any areas of discomfort. He states that he does continue to feel short of breath.  He states that he had recent diarrhea.  He has had ongoing diarrhea for the past 6 months.  History per wife: Patient has had functional decline over the past several months.  He has also had increasing creatinine on his transplanted kidney.  His baseline has been 3 but it was recently increased to 4.  He had a fall 4 days ago.  Patient and wife do not suspect any injuries from the fall.  He was last seen well last night.  Patient's wife got up to go to work at 8:30 AM.  At that time, patient was still sleeping.  When she returned home at 5:30 PM, patient was laying in his bed.  He was sleeping but arousable.  When she woke him up, he seemed confused.  She noted labored breathing.  She checked his blood sugar and it was 47.  She gave him oral blood sugar replacement and blood sugar was subsequently 120.  Despite this, he continued to have increased labored breathing and confusion.  At baseline, patient is able to walk unassisted.  He does have a walker that he uses occasionally.    Home Medications Prior to Admission medications   Medication Sig Start Date End Date Taking? Authorizing Provider   amLODipine (NORVASC) 10 MG tablet Take 10 mg by mouth daily. 12/28/21  Yes [provider]  atorvastatin (LIPITOR) 10 MG tablet Take 10 mg by mouth daily.   Yes [provider]  carvedilol (COREG) 12.5 MG tablet Take 12.5 mg by mouth 2 (two) times daily. 12/29/21  Yes [provider]  Deutetrabenazine (AUSTEDO) 6 MG TABS Take 6 mg by mouth 2 (two) times daily.   Yes [provider]  Dulaglutide (TRULICITY) 1.5 WS/5.6CL SOPN Inject 1.5 mg into the skin every 7 (seven) days.    Yes [provider]  furosemide (LASIX) 20 MG tablet Take 20 mg by mouth daily.    Yes [provider]  hydrALAZINE (APRESOLINE) 50 MG tablet Take 50 mg by mouth 3 (three) times daily. 02/06/22  Yes [provider]  levothyroxine (SYNTHROID) 112 MCG tablet Take 112 mcg by mouth daily. 02/20/22  Yes [provider]  lipase/protease/amylase (CREON) 36000 UNITS CPEP capsule Take 2 capsules (72,000 Units total) by mouth 3 (three) times daily with meals. Take 1 with snacks 11/15/21  Yes Annitta Needs, NP  mycophenolate (MYFORTIC) 180 MG EC tablet Take 360 mg by mouth daily. 02/19/22  Yes [provider]  predniSONE (DELTASONE) 5 MG tablet Take 1 tablet (5 mg total) by mouth daily with breakfast. restart 107m  dose 3/10 after completing prednisone taper (2/24 - 3/9) 06/16/19  Yes Patrecia Pour, MD  sodium bicarbonate 650 MG tablet Take 650 mg by mouth 2 (two) times daily. 11/18/21  Yes [provider]  tacrolimus (PROGRAF) 0.5 MG capsule Take 1.5 mg by mouth 2 (two) times daily.    Yes [provider]  Insulin Disposable Pump (OMNIPOD DASH PDM, GEN 4,) KIT See admin instructions. 09/20/21   [provider]  Insulin Disposable Pump (OMNIPOD DASH PODS, GEN 4,) MISC Inject into the skin. 02/19/22   [provider]  NOVOLOG 100 UNIT/ML injection SMARTSIG:0-200 Unit(s) SUB-Q Every 3 Days Patient not taking: Reported on 02/21/2022  11/04/21   [provider]  oxyCODONE (ROXICODONE) 5 MG immediate release tablet Take 1 tablet (5 mg total) by mouth every 6 (six) hours as needed for up to 12 doses for severe pain. Patient not taking: Reported on 02/21/2022 12/21/21   Fransico Meadow, MD      Allergies    Patient has no known allergies.    Review of Systems   Review of Systems  Constitutional:  Positive for fatigue.  Respiratory:  Positive for cough and shortness of breath.   Gastrointestinal:  Positive for diarrhea.  Neurological:  Positive for weakness (Generalized).  Psychiatric/Behavioral:  Positive for confusion.   All other systems reviewed and are negative.   Physical Exam Updated Vital Signs BP (!) 199/71   Pulse 64   Resp (!) 21   SpO2 99%  Physical Exam Vitals and nursing note reviewed.  Constitutional:      General: He is not in acute distress.    Appearance: He is well-developed. He is not ill-appearing, toxic-appearing or diaphoretic.  HENT:     Head: Normocephalic and atraumatic.     Mouth/Throat:     Mouth: Mucous membranes are moist.     Pharynx: Oropharynx is clear.  Eyes:     Conjunctiva/sclera: Conjunctivae normal.  Cardiovascular:     Rate and Rhythm: Normal rate and regular rhythm.     Heart sounds: No murmur heard. Pulmonary:     Effort: Pulmonary effort is normal. Tachypnea present. No respiratory distress.     Breath sounds: Wheezing and rhonchi present.  Abdominal:     Palpations: Abdomen is soft.     Tenderness: There is no abdominal tenderness.  Musculoskeletal:        General: No swelling. Normal range of motion.     Cervical back: Normal range of motion and neck supple.     Right lower leg: No edema.     Left lower leg: No edema.  Skin:    General: Skin is warm and dry.     Capillary Refill: Capillary refill takes less than 2 seconds.     Coloration: Skin is pale. Skin is not cyanotic.  Neurological:     General: No focal deficit present.     Mental  Status: He is alert and oriented to person, place, and time.     Cranial Nerves: No cranial nerve deficit, dysarthria or facial asymmetry.     Sensory: Sensation is intact. No sensory deficit.     Motor: Motor function is intact. No weakness.     Coordination: Coordination is intact.  Psychiatric:        Mood and Affect: Mood normal.        Behavior: Behavior normal.     ED Results / Procedures / Treatments   Labs (all labs ordered are listed,  but only abnormal results are displayed) Labs Reviewed  COMPREHENSIVE METABOLIC PANEL - Abnormal; Notable for the following components:      Result Value   Sodium 147 (*)    Potassium 5.6 (*)    Chloride 121 (*)    CO2 16 (*)    BUN 88 (*)    Creatinine, Ser 4.38 (*)    Calcium 8.1 (*)    Total Protein 5.4 (*)    Albumin 3.3 (*)    GFR, Estimated 14 (*)    All other components within normal limits  CBC WITH DIFFERENTIAL/PLATELET - Abnormal; Notable for the following components:   RBC 2.68 (*)    Hemoglobin 7.8 (*)    HCT 26.4 (*)    MCHC 29.5 (*)    RDW 16.2 (*)    Abs Immature Granulocytes 0.32 (*)    All other components within normal limits  BRAIN NATRIURETIC PEPTIDE - Abnormal; Notable for the following components:   B Natriuretic Peptide 1,342.0 (*)    All other components within normal limits  I-STAT VENOUS BLOOD GAS, ED - Abnormal; Notable for the following components:   pH, Ven 7.174 (*)    pCO2, Ven 39.2 (*)    pO2, Ven 81 (*)    Bicarbonate 14.4 (*)    TCO2 16 (*)    Acid-base deficit 13.0 (*)    Calcium, Ion 1.13 (*)    HCT 20.0 (*)    Hemoglobin 6.8 (*)    All other components within normal limits  CULTURE, BLOOD (ROUTINE X 2)  RESP PANEL BY RT-PCR (FLU A&B, COVID) ARPGX2  CULTURE, BLOOD (ROUTINE X 2)  MAGNESIUM  BLOOD GAS, VENOUS  TACROLIMUS LEVEL  MYCOPHENOLIC ACID (CELLCEPT)  TSH  LACTIC ACID, PLASMA  LACTIC ACID, PLASMA  CBC WITH DIFFERENTIAL/PLATELET  COMPREHENSIVE METABOLIC PANEL  MAGNESIUM   PHOSPHORUS  BASIC METABOLIC PANEL  CBG MONITORING, ED  TROPONIN I (HIGH SENSITIVITY)  TROPONIN I (HIGH SENSITIVITY)    EKG EKG Interpretation  Date/Time:  Wednesday February 21 2022 21:06:29 EDT Ventricular Rate:  63 PR Interval:  141 QRS Duration: 93 QT Interval:  425 QTC Calculation: 435 R Axis:   23 Text Interpretation: Sinus rhythm Confirmed by Godfrey Pick [phone]) on 02/21/2022 9:08:54 PM  Radiology DG Chest Port 1 View  Result Date: 02/21/2022 CLINICAL DATA:  Shortness of breath, dyspnea.  Hypoglycemia. EXAM: PORTABLE CHEST 1 VIEW COMPARISON:  PA Lat 07/16/2019 FINDINGS: Numerous overlying monitor wires. There is cardiomegaly and mild central vascular distension without overt edema. Heterogeneous opacities in the mid to lower lung fields with peripheral architectural distortion, are probably due to postinflammatory scarring but there is increased patchy haziness in the lung bases concerning for superimposed pneumonia. No pleural effusion is seen. The upper lung fields are generally clear Stable mediastinal widening probably due to lipomatosis. Thoracic spondylosis. IMPRESSION: 1. Increased patchy haziness in the lung bases concerning for pneumonia. Clinical correlation and radiographic follow-up recommended. 2. Cardiomegaly and mild central vascular distension without overt edema. 3. Chronic scarring in the mid to lower lung fields. Electronically Signed   By: Telford Nab M.D.   On: 02/21/2022 21:04    Procedures Procedures    Medications Ordered in ED Medications  azithromycin (ZITHROMAX) 500 mg in sodium chloride 0.9 % 250 mL IVPB (500 mg Intravenous New Bag/Given 02/21/22 2218)  sodium zirconium cyclosilicate (LOKELMA) packet 10 g (has no administration in time range)  insulin aspart (novoLOG) injection 5 Units (has no administration in time range)  And  dextrose 50 % solution 50 mL (has no administration in time range)  methylPREDNISolone sodium succinate (SOLU-MEDROL)  40 mg/mL injection 40 mg (has no administration in time range)  acetaminophen (TYLENOL) tablet 650 mg (has no administration in time range)    Or  acetaminophen (TYLENOL) suppository 650 mg (has no administration in time range)  azithromycin (ZITHROMAX) 500 mg in sodium chloride 0.9 % 250 mL IVPB (has no administration in time range)  cefTRIAXone (ROCEPHIN) 1 g in sodium chloride 0.9 % 100 mL IVPB (has no administration in time range)  lactated ringers bolus 500 mL (has no administration in time range)  albuterol (PROVENTIL) (2.5 MG/3ML) 0.083% nebulizer solution 5 mg (5 mg Nebulization Given 02/21/22 2120)  cefTRIAXone (ROCEPHIN) 1 g in sodium chloride 0.9 % 100 mL IVPB (0 g Intravenous Stopped 02/21/22 2209)    ED Course/ Medical Decision Making/ A&P                           Medical Decision Making Amount and/or Complexity of Data Reviewed Labs: ordered. Radiology: ordered.  Risk OTC drugs. Prescription drug management. Decision regarding hospitalization.   This patient presents to the ED for concern of shortness of breath, this involves an extensive number of treatment options, and is a complaint that carries with it a high risk of complications and morbidity.  The differential diagnosis includes pneumonia, reactive airway disease exacerbation, CHF exacerbation, metabolic acidosis, CVA, seizure   Co morbidities that complicate the patient evaluation  HTN, hypothyroidism, CKD, renal transplant in 2010, diabetes   Additional history obtained:  Additional history obtained from EMS, patient's wife External records from outside source obtained and reviewed including EMR   Lab Tests:  I Ordered, and personally interpreted labs.  The pertinent results include: Metabolic acidosis is present on blood gas.  CBC is notable for 2 g/dL drop in hemoglobin over the past month.  No leukocytosis is present.  CMP is notable for metabolic acidosis, AKI, hyperkalemia, hyperchloremia, and mild  hyponatremia.  BNP is elevated but this appears to be baseline.  BNP is markedly elevated.  Remaining lab work is pending at time of admission.   Imaging Studies ordered:  I ordered imaging studies including chest x-ray I independently visualized and interpreted imaging which showed bibasilar pneumonia. I agree with the radiologist interpretation   Cardiac Monitoring: / EKG:  The patient was maintained on a cardiac monitor.  I personally viewed and interpreted the cardiac monitored which showed an underlying rhythm of: Sinus rhythm  Problem List / ED Course / Critical interventions / Medication management  Patient presents for shortness of breath.  EMS noticed SPO2 in the 70s on scene.  He was placed on supplemental oxygen.  Although wife had concerns of confusion, this appears to be in the setting of hypoglycemia.  Patient was given food and drink and had normalization of blood glucose at time of EMS arrival.  Patient arrives alert and oriented.  He does have increased work of breathing and coarse breath sounds on lung auscultation.  There is also evidence of wheezing.  Vital signs otherwise notable for moderately elevated blood pressure.  Patient was placed on bedside monitor.  Chest x-ray shows concern of bibasilar opacities.  Patient was treated empirically for pneumonia.  DuoNeb was ordered for wheezing.  Patient's lab work shows AKI, hyperkalemia, acute on chronic anemia, and elevated BNP.  I suspect that his shortness of breath is multifactorial with concern of  symptomatic anemia and cardiogenic pulmonary edema.  Gentle IV fluids were given.  Medications were ordered for temporization of hyperkalemia.  Patient remained alert and oriented, however, continued to have increased work of breathing.  BiPAP was ordered for work of breathing.  Patient was admitted to medicine for further management. I ordered medication including IVF for hydration; DuoNeb and Solu-Medrol for wheezing; antibiotics for  pneumonia; Lokelma, insulin/dextrose for hyperkalemia Reevaluation of the patient after these medicines showed that the patient improved I have reviewed the patients home medicines and have made adjustments as needed   Social Determinants of Health:  Lives at home with wife, has access to outpatient care  CRITICAL CARE Performed by: Godfrey Pick   Total critical care time: 35 minutes  Critical care time was exclusive of separately billable procedures and treating other patients.  Critical care was necessary to treat or prevent imminent or life-threatening deterioration.  Critical care was time spent personally by me on the following activities: development of treatment plan with patient and/or surrogate as well as nursing, discussions with consultants, evaluation of patient's response to treatment, examination of patient, obtaining history from patient or surrogate, ordering and performing treatments and interventions, ordering and review of laboratory studies, ordering and review of radiographic studies, pulse oximetry and re-evaluation of patient's condition.         Final Clinical Impression(s) / ED Diagnoses Final diagnoses:  Acute respiratory failure with hypoxia ([place])  Pneumonia of both lower lobes due to infectious organism  Hyperkalemia  AKI (acute kidney injury) ([place])  Low hemoglobin  Hypoglycemia    Rx / DC Orders ED Discharge Orders     None         Godfrey Pick, MD 02/21/22 2249

## 2022-02-21 NOTE — H&P (Signed)
History and Physical    PLEASE NOTE THAT DRAGON DICTATION SOFTWARE WAS USED IN THE CONSTRUCTION OF THIS NOTE.   Nathan Tyler VAN:[id number] DOB: November 02, 1955 DOA: 02/21/2022  PCP: Celene Squibb, MD *** Patient coming from: home ***  I have personally briefly reviewed patient's old medical records in [place]  Chief Complaint: ***  HPI: Nathan Tyler is a 66 y.o. male with medical history significant for *** who is admitted to [facility] on 02/21/2022 with *** after presenting from home*** to [facility] ED complaining of ***.    ***       ***   ED Course:  Vital signs in the ED were notable for the following: ***  Labs were notable for the following: ***  Imaging and additional notable ED work-up: ***  While in the ED, the following were administered: ***  Subsequently, the patient was admitted  ***  ***red    Review of Systems: As per HPI otherwise 10 point review of systems negative.   Past Medical History:  Diagnosis Date   Chorea    Diabetes mellitus without complication ([place])    Hypercholesteremia    Hypothyroidism    Neuromuscular disorder (HCC)    PONV (postoperative nausea and vomiting)    Renal failure    Has had kidney transplant 2008    Past Surgical History:  Procedure Laterality Date   AMPUTATION Right 06/27/2016   Procedure: AMPUTATION 5TH TOE RIGHT FOOT;  Surgeon: Caprice Beaver, DPM;  Location: [facility];  Service: Podiatry;  Laterality: Right;   AMPUTATION TOE Left 09/02/2015   Procedure: AMPUTATION  2ND TOE LEFT FOOT;  Surgeon: Aviva Signs, MD;  Location: [facility];  Service: General;  Laterality: Left;   BIOPSY  11/24/2021   Procedure: BIOPSY;  Surgeon: Daneil Dolin, MD;  Location: [facility];  Service: Endoscopy;;   CATARACT EXTRACTION Bilateral    COLONOSCOPY  01/2014   done in [place]. patient reports h/o colon polyps on TCS in 2010, 2015 colonoscopy normal but advised to come back in 2020.    COLONOSCOPY WITH PROPOFOL N/A  01/19/2019   Procedure: COLONOSCOPY WITH PROPOFOL;  Surgeon: Daneil Dolin, MD;  Location: [facility];  Service: Endoscopy;  Laterality: N/A;  9:45am   COLONOSCOPY WITH PROPOFOL N/A 11/24/2021   Procedure: COLONOSCOPY WITH PROPOFOL;  Surgeon: Daneil Dolin, MD;  Location: [facility];  Service: Endoscopy;  Laterality: N/A;  2:15pm   KIDNEY TRANSPLANT Right 2010   POLYPECTOMY  01/19/2019   Procedure: POLYPECTOMY;  Surgeon: Daneil Dolin, MD;  Location: [facility];  Service: Endoscopy;;   POLYPECTOMY  11/24/2021   Procedure: POLYPECTOMY;  Surgeon: Daneil Dolin, MD;  Location: [facility];  Service: Endoscopy;;    Social History:  reports that he has never smoked. He has never been exposed to tobacco smoke. He has never used smokeless tobacco. He reports current alcohol use. He reports that he does not use drugs.   No Known Allergies  Family History  Problem Relation Age of Onset   Colon cancer Maternal Aunt 75       passed from [facility]    Family history reviewed and not pertinent ***   Prior to Admission medications   Medication Sig Start Date End Date Taking? Authorizing Provider  amLODipine (NORVASC) 10 MG tablet Take 10 mg by mouth daily. 12/28/21  Yes [provider]  atorvastatin (LIPITOR) 10 MG tablet Take 10 mg by mouth daily.  Yes [provider]  carvedilol (COREG) 12.5 MG tablet Take 12.5 mg by mouth 2 (two) times daily. 12/29/21  Yes [provider]  Deutetrabenazine (AUSTEDO) 6 MG TABS Take 6 mg by mouth 2 (two) times daily.   Yes [provider]  Dulaglutide (TRULICITY) 1.5 BD/5.3GD SOPN Inject 1.5 mg into the skin every 7 (seven) days.    Yes [provider]  furosemide (LASIX) 20 MG tablet Take 20 mg by mouth daily.    Yes [provider]  hydrALAZINE (APRESOLINE) 50 MG tablet Take 50 mg by mouth 3 (three) times daily. 02/06/22  Yes [provider]  levothyroxine (SYNTHROID) 112 MCG tablet Take 112  mcg by mouth daily. 02/20/22  Yes [provider]  lipase/protease/amylase (CREON) 36000 UNITS CPEP capsule Take 2 capsules (72,000 Units total) by mouth 3 (three) times daily with meals. Take 1 with snacks 11/15/21  Yes Annitta Needs, NP  mycophenolate (MYFORTIC) 180 MG EC tablet Take 360 mg by mouth daily. 02/19/22  Yes [provider]  predniSONE (DELTASONE) 5 MG tablet Take 1 tablet (5 mg total) by mouth daily with breakfast. restart 87m dose 3/10 after completing prednisone taper (2/24 - 3/9) 06/16/19  Yes GPatrecia Pour MD  sodium bicarbonate 650 MG tablet Take 650 mg by mouth 2 (two) times daily. 11/18/21  Yes [provider]  tacrolimus (PROGRAF) 0.5 MG capsule Take 1.5 mg by mouth 2 (two) times daily.    Yes [provider]  Insulin Disposable Pump (OMNIPOD DASH PDM, GEN 4,) KIT See admin instructions. 09/20/21   [provider]  Insulin Disposable Pump (OMNIPOD DASH PODS, GEN 4,) MISC Inject into the skin. 02/19/22   [provider]  NOVOLOG 100 UNIT/ML injection SMARTSIG:0-200 Unit(s) SUB-Q Every 3 Days Patient not taking: Reported on 02/21/2022 11/04/21   [provider]  oxyCODONE (ROXICODONE) 5 MG immediate release tablet Take 1 tablet (5 mg total) by mouth every 6 (six) hours as needed for up to 12 doses for severe pain. Patient not taking: Reported on 02/21/2022 12/21/21   PFransico Meadow MD     Objective    Physical Exam: Vitals:   02/21/22 2215 02/21/22 2230  BP: (!) 199/71   Pulse: 61 64  Resp: 18 (!) 21  SpO2: 100% 99%    General: appears to be stated age; alert, oriented Skin: warm, dry, no rash Head:  AT/[place] Mouth:  Oral mucosa membranes appear moist, normal dentition Neck: supple; trachea midline Heart:  RRR; did not appreciate any M/R/G Lungs: CTAB, did not appreciate any wheezes, rales, or rhonchi Abdomen: + BS; soft, ND, NT Vascular: 2+ pedal pulses b/l; 2+ radial pulses b/l Extremities: no  peripheral edema, no muscle wasting Neuro: strength and sensation intact in upper and lower extremities b/l ***   *** Neuro: 5/5 strength of the proximal and distal flexors and extensors of the upper and lower extremities bilaterally; sensation intact in upper and lower extremities b/l; cranial nerves II through XII grossly intact; no pronator drift; no evidence suggestive of slurred speech, dysarthria, or facial droop; Normal muscle tone. No tremors.  *** Neuro: In the setting of the patient's current mental status and associated inability to follow instructions, unable to perform full neurologic exam at this time.  As such, assessment of strength, sensation, and cranial nerves is limited at this time. Patient noted to spontaneously move all 4 extremities. No tremors.  ***    Labs on Admission: I have personally reviewed  following labs and imaging studies  CBC: Recent Labs  Lab 02/21/22 2048 02/21/22 2119  WBC 7.9  --   NEUTROABS 4.5  --   HGB 7.8* 6.8*  HCT 26.4* 20.0*  MCV 98.5  --   PLT 274  --    Basic Metabolic Panel: Recent Labs  Lab 02/21/22 2048 02/21/22 2119  NA 147* 145  K 5.6* 5.1  CL 121*  --   CO2 16*  --   GLUCOSE 92  --   BUN 88*  --   CREATININE 4.38*  --   CALCIUM 8.1*  --   MG 2.3  --    GFR: CrCl cannot be calculated (Unknown ideal weight.). Liver Function Tests: Recent Labs  Lab 02/21/22 2048  AST 16  ALT 15  ALKPHOS 72  BILITOT 0.4  PROT 5.4*  ALBUMIN 3.3*   No results for input(s): "LIPASE", "AMYLASE" in the last 168 hours. No results for input(s): "AMMONIA" in the last 168 hours. Coagulation Profile: No results for input(s): "INR", "PROTIME" in the last 168 hours. Cardiac Enzymes: No results for input(s): "CKTOTAL", "CKMB", "CKMBINDEX", "TROPONINI" in the last 168 hours. BNP (last 3 results) No results for input(s): "PROBNP" in the last 8760 hours. HbA1C: No results for input(s): "HGBA1C" in the last 72 hours. CBG: Recent Labs   Lab 02/21/22 2102  GLUCAP 82   Lipid Profile: No results for input(s): "CHOL", "HDL", "LDLCALC", "TRIG", "CHOLHDL", "LDLDIRECT" in the last 72 hours. Thyroid Function Tests: No results for input(s): "TSH", "T4TOTAL", "FREET4", "T3FREE", "THYROIDAB" in the last 72 hours. Anemia Panel: No results for input(s): "VITAMINB12", "FOLATE", "FERRITIN", "TIBC", "IRON", "RETICCTPCT" in the last 72 hours. Urine analysis: No results found for: "COLORURINE", "APPEARANCEUR", "LABSPEC", "PHURINE", "GLUCOSEU", "HGBUR", "BILIRUBINUR", "KETONESUR", "PROTEINUR", "UROBILINOGEN", "NITRITE", "LEUKOCYTESUR"  Radiological Exams on Admission: DG Chest Port 1 View  Result Date: 02/21/2022 CLINICAL DATA:  Shortness of breath, dyspnea.  Hypoglycemia. EXAM: PORTABLE CHEST 1 VIEW COMPARISON:  PA Lat 07/16/2019 FINDINGS: Numerous overlying monitor wires. There is cardiomegaly and mild central vascular distension without overt edema. Heterogeneous opacities in the mid to lower lung fields with peripheral architectural distortion, are probably due to postinflammatory scarring but there is increased patchy haziness in the lung bases concerning for superimposed pneumonia. No pleural effusion is seen. The upper lung fields are generally clear Stable mediastinal widening probably due to lipomatosis. Thoracic spondylosis. IMPRESSION: 1. Increased patchy haziness in the lung bases concerning for pneumonia. Clinical correlation and radiographic follow-up recommended. 2. Cardiomegaly and mild central vascular distension without overt edema. 3. Chronic scarring in the mid to lower lung fields. Electronically Signed   By: Telford Nab M.D.   On: 02/21/2022 21:04     EKG: Independently reviewed, with result as described above. ***   Assessment/Plan   Principal Problem:   CAP (community acquired  pneumonia)   ***       ***            ***             ***            ***            ***            ***           ***   ***  DVT prophylaxis: SCD's ***  Code Status: Full code*** Family Communication: none*** Disposition Plan: Per Rounding Team Consults called: none***;  Admission status: ***  PLEASE NOTE THAT DRAGON DICTATION SOFTWARE WAS USED IN THE CONSTRUCTION OF THIS NOTE.   Wheeler DO [facility]  From [place]   02/21/2022, 10:39 PM   ***

## 2022-02-22 ENCOUNTER — Inpatient Hospital Stay (HOSPITAL_COMMUNITY): Payer: Medicare Other

## 2022-02-22 ENCOUNTER — Encounter (HOSPITAL_COMMUNITY): Payer: Self-pay | Admitting: Internal Medicine

## 2022-02-22 DIAGNOSIS — E875 Hyperkalemia: Secondary | ICD-10-CM | POA: Insufficient documentation

## 2022-02-22 DIAGNOSIS — D649 Anemia, unspecified: Secondary | ICD-10-CM | POA: Diagnosis present

## 2022-02-22 DIAGNOSIS — E119 Type 2 diabetes mellitus without complications: Secondary | ICD-10-CM

## 2022-02-22 DIAGNOSIS — N179 Acute kidney failure, unspecified: Secondary | ICD-10-CM | POA: Diagnosis not present

## 2022-02-22 DIAGNOSIS — G9341 Metabolic encephalopathy: Secondary | ICD-10-CM | POA: Diagnosis present

## 2022-02-22 DIAGNOSIS — J189 Pneumonia, unspecified organism: Secondary | ICD-10-CM | POA: Diagnosis not present

## 2022-02-22 DIAGNOSIS — I5032 Chronic diastolic (congestive) heart failure: Secondary | ICD-10-CM | POA: Diagnosis present

## 2022-02-22 DIAGNOSIS — N184 Chronic kidney disease, stage 4 (severe): Secondary | ICD-10-CM

## 2022-02-22 HISTORY — DX: Acute kidney failure, unspecified: N18.4

## 2022-02-22 HISTORY — DX: Metabolic encephalopathy: G93.41

## 2022-02-22 LAB — CBC WITH DIFFERENTIAL/PLATELET
Abs Immature Granulocytes: 0.27 10*3/uL — ABNORMAL HIGH (ref 0.00–0.07)
Basophils Absolute: 0 10*3/uL (ref 0.0–0.1)
Basophils Relative: 0 %
Eosinophils Absolute: 0 10*3/uL (ref 0.0–0.5)
Eosinophils Relative: 0 %
HCT: 25.5 % — ABNORMAL LOW (ref 39.0–52.0)
Hemoglobin: 7.5 g/dL — ABNORMAL LOW (ref 13.0–17.0)
Immature Granulocytes: 3 %
Lymphocytes Relative: 12 %
Lymphs Abs: 1 10*3/uL (ref 0.7–4.0)
MCH: 28.7 pg (ref 26.0–34.0)
MCHC: 29.4 g/dL — ABNORMAL LOW (ref 30.0–36.0)
MCV: 97.7 fL (ref 80.0–100.0)
Monocytes Absolute: 0.2 10*3/uL (ref 0.1–1.0)
Monocytes Relative: 3 %
Neutro Abs: 6.6 10*3/uL (ref 1.7–7.7)
Neutrophils Relative %: 82 %
Platelets: 253 10*3/uL (ref 150–400)
RBC: 2.61 MIL/uL — ABNORMAL LOW (ref 4.22–5.81)
RDW: 15.9 % — ABNORMAL HIGH (ref 11.5–15.5)
WBC: 8.1 10*3/uL (ref 4.0–10.5)
nRBC: 0 % (ref 0.0–0.2)

## 2022-02-22 LAB — COMPREHENSIVE METABOLIC PANEL
ALT: 16 U/L (ref 0–44)
AST: 16 U/L (ref 15–41)
Albumin: 3.3 g/dL — ABNORMAL LOW (ref 3.5–5.0)
Alkaline Phosphatase: 74 U/L (ref 38–126)
Anion gap: 7 (ref 5–15)
BUN: 84 mg/dL — ABNORMAL HIGH (ref 8–23)
CO2: 16 mmol/L — ABNORMAL LOW (ref 22–32)
Calcium: 8.1 mg/dL — ABNORMAL LOW (ref 8.9–10.3)
Chloride: 120 mmol/L — ABNORMAL HIGH (ref 98–111)
Creatinine, Ser: 4.3 mg/dL — ABNORMAL HIGH (ref 0.61–1.24)
GFR, Estimated: 14 mL/min — ABNORMAL LOW (ref >60)
Glucose, Bld: 94 mg/dL (ref 70–99)
Potassium: 5.8 mmol/L — ABNORMAL HIGH (ref 3.5–5.1)
Sodium: 143 mmol/L (ref 135–145)
Total Bilirubin: 0.3 mg/dL (ref 0.3–1.2)
Total Protein: 5.5 g/dL — ABNORMAL LOW (ref 6.5–8.1)

## 2022-02-22 LAB — RETICULOCYTES
Immature Retic Fract: 19.5 % — ABNORMAL HIGH (ref 2.3–15.9)
RBC.: 2.58 MIL/uL — ABNORMAL LOW (ref 4.22–5.81)
Retic Count, Absolute: 83.6 10*3/uL (ref 19.0–186.0)
Retic Ct Pct: 3.2 % — ABNORMAL HIGH (ref 0.4–3.1)

## 2022-02-22 LAB — HEMOGLOBIN A1C
Hgb A1c MFr Bld: 5.7 % — ABNORMAL HIGH (ref 4.8–5.6)
Mean Plasma Glucose: 116.89 mg/dL

## 2022-02-22 LAB — TYPE AND SCREEN
ABO/RH(D): O POS
Antibody Screen: NEGATIVE

## 2022-02-22 LAB — TROPONIN I (HIGH SENSITIVITY): Troponin I (High Sensitivity): 14 ng/L (ref <18)

## 2022-02-22 LAB — CBG MONITORING, ED
Glucose-Capillary: 98 mg/dL (ref 70–99)
Glucose-Capillary: 155 mg/dL — ABNORMAL HIGH (ref 70–99)

## 2022-02-22 LAB — RESP PANEL BY RT-PCR (FLU A&B, COVID) ARPGX2
Influenza A by PCR: NEGATIVE
Influenza B by PCR: NEGATIVE
SARS Coronavirus 2 by RT PCR: NEGATIVE

## 2022-02-22 LAB — GLUCOSE, CAPILLARY
Glucose-Capillary: 280 mg/dL — ABNORMAL HIGH (ref 70–99)
Glucose-Capillary: 134 mg/dL — ABNORMAL HIGH (ref 70–99)

## 2022-02-22 LAB — IRON AND TIBC
Iron: 33 ug/dL — ABNORMAL LOW (ref 45–182)
Saturation Ratios: 13 % — ABNORMAL LOW (ref 17.9–39.5)
TIBC: 260 ug/dL (ref 250–450)
UIBC: 227 ug/dL

## 2022-02-22 LAB — PHOSPHORUS: Phosphorus: 6.3 mg/dL — ABNORMAL HIGH (ref 2.5–4.6)

## 2022-02-22 LAB — PROCALCITONIN: Procalcitonin: 0.15 ng/mL

## 2022-02-22 LAB — PROTIME-INR
INR: 1.1 (ref 0.8–1.2)
Prothrombin Time: 14.2 seconds (ref 11.4–15.2)

## 2022-02-22 LAB — MAGNESIUM: Magnesium: 2.3 mg/dL (ref 1.7–2.4)

## 2022-02-22 LAB — FOLATE: Folate: 11.6 ng/mL (ref >5.9)

## 2022-02-22 LAB — FERRITIN: Ferritin: 155 ng/mL (ref 24–336)

## 2022-02-22 LAB — VITAMIN B12: Vitamin B-12: 277 pg/mL (ref 180–914)

## 2022-02-22 MED ORDER — SODIUM BICARBONATE 650 MG PO TABS
650.0000 mg | ORAL_TABLET | Freq: Two times a day (BID) | ORAL | Status: DC
  Administered 2022-02-22 – 2022-02-23 (×3): 650 mg via ORAL
  Filled 2022-02-22 (×3): qty 1

## 2022-02-22 MED ORDER — SODIUM CHLORIDE 0.9 % IV SOLN: INTRAVENOUS | Status: AC

## 2022-02-22 MED ORDER — CARVEDILOL 12.5 MG PO TABS
12.5000 mg | ORAL_TABLET | Freq: Two times a day (BID) | ORAL | Status: DC
  Administered 2022-02-22 – 2022-02-28 (×13): 12.5 mg via ORAL
  Filled 2022-02-22 (×13): qty 1

## 2022-02-22 MED ORDER — TACROLIMUS 1 MG PO CAPS
1.5000 mg | ORAL_CAPSULE | Freq: Two times a day (BID) | ORAL | Status: DC
  Administered 2022-02-22 – 2022-02-28 (×13): 1.5 mg via ORAL
  Filled 2022-02-22 (×17): qty 1

## 2022-02-22 MED ORDER — LEVOTHYROXINE SODIUM 112 MCG PO TABS
112.0000 ug | ORAL_TABLET | Freq: Every day | ORAL | Status: DC
  Administered 2022-02-22 – 2022-02-28 (×7): 112 ug via ORAL
  Filled 2022-02-22 (×7): qty 1

## 2022-02-22 MED ORDER — MYCOPHENOLATE SODIUM 180 MG PO TBEC
360.0000 mg | DELAYED_RELEASE_TABLET | Freq: Every day | ORAL | Status: DC
  Administered 2022-02-22: 360 mg via ORAL
  Filled 2022-02-22: qty 2

## 2022-02-22 MED ORDER — HYDRALAZINE HCL 25 MG PO TABS: 50.0000 mg | ORAL_TABLET | Freq: Three times a day (TID) | ORAL | Status: DC

## 2022-02-22 MED ORDER — SODIUM POLYSTYRENE SULFONATE 15 GM/60ML PO SUSP
15.0000 g | Freq: Once | ORAL | Status: AC
  Administered 2022-02-22: 15 g via ORAL
  Filled 2022-02-22: qty 60

## 2022-02-22 MED ORDER — MYCOPHENOLATE SODIUM 180 MG PO TBEC
180.0000 mg | DELAYED_RELEASE_TABLET | Freq: Two times a day (BID) | ORAL | Status: DC
  Administered 2022-02-23 – 2022-02-28 (×11): 180 mg via ORAL
  Filled 2022-02-22 (×13): qty 1

## 2022-02-22 MED ORDER — INSULIN ASPART 100 UNIT/ML IJ SOLN
0.0000 [IU] | Freq: Three times a day (TID) | INTRAMUSCULAR | Status: DC
  Administered 2022-02-22: 3 [IU] via SUBCUTANEOUS
  Administered 2022-02-22: 1 [IU] via SUBCUTANEOUS
  Administered 2022-02-23: 2 [IU] via SUBCUTANEOUS
  Administered 2022-02-23 – 2022-02-27 (×11): 1 [IU] via SUBCUTANEOUS

## 2022-02-22 NOTE — ED Notes (Addendum)
Assisted pt to bedside commode at this time. Unable to send UA. Pt had BM with urination. Pt linens changed. Pt tolerated standing and transferring well.

## 2022-02-22 NOTE — Progress Notes (Signed)
Pt. Breathing appearing irregular again. Pt. Placed back on bipap. Tolerating well at this time.RN aware.

## 2022-02-22 NOTE — ED Notes (Signed)
Patient helped to bedside commode.

## 2022-02-22 NOTE — ED Notes (Signed)
Called respiratory therapy at this time.  Pt BIPAP removed and pt placed on 2L [place] per RT to let pt take medications and eat breakfast. Pt maintaining a saturation 94-97% on [place]. Will continue to monitor pt.

## 2022-02-22 NOTE — Plan of Care (Signed)

## 2022-02-22 NOTE — ED Notes (Signed)
Called Lab to add on b12 and folate.

## 2022-02-22 NOTE — Progress Notes (Signed)
[facility] PROGRESS NOTE    Progress Note  Nathan Tyler  RJJ:[id number] DOB: Feb 25, 1956 DOA: 02/21/2022 PCP: Celene Squibb, MD     Brief Narrative:   Nathan Tyler is an 66 y.o. male past medical history significant for renal transplant in 2010 chronically immunosuppressed, chronic renal disease stage IV with a baseline creatinine around 3-4, diabetes mellitus type 2, essential hypertension admitted to [facility] for shortness of breath subjective fever and cough was found to have community-acquired pneumonia.    Assessment/Plan:   CAP (community acquired pneumonia) CT of the chest shows small bilateral pleural effusions bilateral upper lung glass opacities. Has remained afebrile, has no leukocytosis. Was started empirically on Rocephin and azithromycin. Culture data has been sent, SARS-CoV-2 and influenza PCR negative. Sepsis has been ruled out not tachycardic no leukocytosis blood pressure stable.  Acute renal failure superimposed on stage 4 chronic kidney disease ([place]): With a baseline creatinine of 3-4, on admission 4.4. We will start on gentle IV fluid hydration diuretics were held his creatinine today is 4.3  Hyperkalemia: He was started on IV fluids was given Lokelma his potassium continues to rise. We will give oral Kayexalate repeated basic metabolic panel in the morning.  Anemia of chronic renal disease: With a baseline creatinine of 9-10, admission 7.8. RDW is mildly elevated MCV is borderline check B12 and folate. Type and screen transfuse if symptomatic or hemoglobin less than 7.  Acute metabolic encephalopathy: As per wife slightly more lethargic and confused likely due to infectious etiology. At risk of delirium and aspiration.  Essential hypertension: Hold Lasix resume Coreg blood pressure is trending upwards. Agree with holding Norvasc.  Chronic diastolic heart failure: Appears dry, diuretics were held was started on gentle IV fluids due to acute kidney  injury. Monitor strict I's and O's.   DVT prophylaxis: lovenox Family Communication:Wife Status is: Inpatient Remains inpatient appropriate because: Community-acquired pneumonia    Code Status:     Code Status Orders  (From admission, onward)           Start     Ordered   02/21/22 2232  Full code  Continuous        02/21/22 2231           Code Status History     Date Active Date Inactive Code Status Order ID Comments User Context   05/31/2019 1110 06/16/2019 2317 Full Code [id number]  [place] Eric J, DO Inpatient         IV Access:   Peripheral IV   Procedures and diagnostic studies:   CT Chest Wo Contrast  Result Date: 02/22/2022 CLINICAL DATA:  Dyspnea, pneumonia EXAM: CT CHEST WITHOUT CONTRAST TECHNIQUE: Multidetector CT imaging of the chest was performed following the standard protocol without IV contrast. RADIATION DOSE REDUCTION: This exam was performed according to the departmental dose-optimization program which includes automated exposure control, adjustment of the mA and/or kV according to patient size and/or use of iterative reconstruction technique. COMPARISON:  06/03/2019 FINDINGS: Cardiovascular: Extensive multi-vessel coronary artery calcification. Global cardiac size within normal limits. No pericardial effusion. Central pulmonary arteries are mildly enlarged in keeping with changes of pulmonary arterial hypertension. Mild atherosclerotic calcification within the thoracic aorta. No aortic aneurysm. Mediastinum/Nodes: No enlarged mediastinal or axillary lymph nodes. Thyroid gland, trachea, and esophagus demonstrate no significant findings. Lungs/Pleura: Small bilateral pleural effusions are present with associated bibasilar atelectasis. There are bilateral basilar predominant ground-glass and reticular pulmonary infiltrates which appear distributed in the areas most severely affected  on prior CT examination and likely represent residual post inflammatory  scarring. There is, however, more focal consolidative infiltrate within the right lower lobe, axial image # 116/5, which appears new from prior examination may represent an acute infectious process. No pneumothorax. No central obstructing lesion. Upper Abdomen: No acute abnormality. Musculoskeletal: Multiple remote bilateral rib fractures are identified. No acute bone abnormality. No lytic or blastic bone lesion. IMPRESSION: 1. Extensive multi-vessel coronary artery calcification. 2. Small bilateral pleural effusions with associated bibasilar atelectasis. 3. Bilateral basilar predominant ground-glass and reticular pulmonary infiltrates which appear distributed in the areas most severely affected on prior CT examination and likely represent residual post inflammatory scarring. 4. More focal consolidative infiltrate within the right lower lobe, which appears new from prior examination may represent a superimposed acute infectious process. Follow-up examination 3 months would be helpful to document resolution. 5. Morphologic changes in keeping with pulmonary arterial hypertension. Aortic Atherosclerosis (ICD10-I70.0). Electronically Signed   By: Fidela Salisbury M.D.   On: 02/22/2022 02:56   CT Head Wo Contrast  Result Date: 02/22/2022 CLINICAL DATA:  Mental status change, unknown cause EXAM: CT HEAD WITHOUT CONTRAST TECHNIQUE: Contiguous axial images were obtained from the base of the skull through the vertex without intravenous contrast. RADIATION DOSE REDUCTION: This exam was performed according to the departmental dose-optimization program which includes automated exposure control, adjustment of the mA and/or kV according to patient size and/or use of iterative reconstruction technique. COMPARISON:  12/27/2021 FINDINGS: Brain: Normal anatomic configuration. Parenchymal volume loss is stable, though slightly advanced given the patient's age. Mild periventricular white matter changes are present likely reflecting  the sequela of small vessel ischemia. Remote lacunar infarct within the anterior limb of the left internal capsule. No abnormal intra or extra-axial mass lesion or fluid collection. No abnormal mass effect or midline shift. No evidence of acute intracranial hemorrhage or infarct. Ventricular size is normal. Cerebellum unremarkable. Vascular: No asymmetric hyperdense vasculature at the skull base. Skull: Intact Sinuses/Orbits: Left maxillary mucosal thickening noted. No air-fluid level. Remaining paranasal sinuses are clear. Orbits are unremarkable. Other: Mastoid air cells and middle ear cavities are clear. IMPRESSION: 1. No acute intracranial hemorrhage or infarct. 2. Stable mild senescent change. 3. Mild left maxillary sinus disease. Electronically Signed   By: Fidela Salisbury M.D.   On: 02/22/2022 02:47   DG Chest Port 1 View  Result Date: 02/21/2022 CLINICAL DATA:  Shortness of breath, dyspnea.  Hypoglycemia. EXAM: PORTABLE CHEST 1 VIEW COMPARISON:  PA Lat 07/16/2019 FINDINGS: Numerous overlying monitor wires. There is cardiomegaly and mild central vascular distension without overt edema. Heterogeneous opacities in the mid to lower lung fields with peripheral architectural distortion, are probably due to postinflammatory scarring but there is increased patchy haziness in the lung bases concerning for superimposed pneumonia. No pleural effusion is seen. The upper lung fields are generally clear Stable mediastinal widening probably due to lipomatosis. Thoracic spondylosis. IMPRESSION: 1. Increased patchy haziness in the lung bases concerning for pneumonia. Clinical correlation and radiographic follow-up recommended. 2. Cardiomegaly and mild central vascular distension without overt edema. 3. Chronic scarring in the mid to lower lung fields. Electronically Signed   By: Telford Nab M.D.   On: 02/21/2022 21:04     Medical Consultants:   None.   Subjective:    Nathan Tyler relates he feels slightly  better than when he came in  Objective:    Vitals:   02/22/22 0200 02/22/22 0215 02/22/22 0430 02/22/22 0531  BP: (!) 156/73 137/69 Marland Kitchen)  150/64   Pulse: 63 61 67 72  Resp:  15 17 (!) 22  Temp:  97.9 F (36.6 C)    TempSrc:  Oral    SpO2: 99% 94% 93% 100%   SpO2: 100 %   Intake/Output Summary (Last 24 hours) at 02/22/2022 6333 Last data filed at 02/22/2022 0030 Gross per 24 hour  Intake 350 ml  Output --  Net 350 ml   There were no vitals filed for this visit.  Exam: General exam: In no acute distress. Respiratory system: Good air movement and crackles bilaterally Cardiovascular system: S1 & S2 heard, RRR. No JVD. Gastrointestinal system: Abdomen is nondistended, soft and nontender.  Extremities: No pedal edema. Skin: No rashes, lesions or ulcers Psychiatry: Judgement and insight appear normal. Mood & affect appropriate.    Data Reviewed:    Labs: Basic Metabolic Panel: Recent Labs  Lab 02/21/22 2048 02/21/22 2119 02/22/22 0209  NA 147* 145 143  K 5.6* 5.1 5.8*  CL 121*  --  120*  CO2 16*  --  16*  GLUCOSE 92  --  94  BUN 88*  --  84*  CREATININE 4.38*  --  4.30*  CALCIUM 8.1*  --  8.1*  MG 2.3  --  2.3  PHOS  --   --  6.3*   GFR CrCl cannot be calculated (Unknown ideal weight.). Liver Function Tests: Recent Labs  Lab 02/21/22 2048 02/22/22 0209  AST 16 16  ALT 15 16  ALKPHOS 72 74  BILITOT 0.4 0.3  PROT 5.4* 5.5*  ALBUMIN 3.3* 3.3*   No results for input(s): "LIPASE", "AMYLASE" in the last 168 hours. No results for input(s): "AMMONIA" in the last 168 hours. Coagulation profile No results for input(s): "INR", "PROTIME" in the last 168 hours. COVID-19 Labs  No results for input(s): "DDIMER", "FERRITIN", "LDH", "CRP" in the last 72 hours.  Lab Results  Component Value Date   SARSCOV2NAA NEGATIVE 02/21/2022   [place] NEGATIVE 01/15/2019    CBC: Recent Labs  Lab 02/21/22 2048 02/21/22 2119 02/22/22 0209  WBC 7.9  --  8.1   NEUTROABS 4.5  --  6.6  HGB 7.8* 6.8* 7.5*  HCT 26.4* 20.0* 25.5*  MCV 98.5  --  97.7  PLT 274  --  253   Cardiac Enzymes: No results for input(s): "CKTOTAL", "CKMB", "CKMBINDEX", "TROPONINI" in the last 168 hours. BNP (last 3 results) No results for input(s): "PROBNP" in the last 8760 hours. CBG: Recent Labs  Lab 02/21/22 2102  GLUCAP 82   D-Dimer: No results for input(s): "DDIMER" in the last 72 hours. Hgb A1c: No results for input(s): "HGBA1C" in the last 72 hours. Lipid Profile: No results for input(s): "CHOL", "HDL", "LDLCALC", "TRIG", "CHOLHDL", "LDLDIRECT" in the last 72 hours. Thyroid function studies: Recent Labs    02/21/22 2100  TSH 4.644*   Anemia work up: No results for input(s): "VITAMINB12", "FOLATE", "FERRITIN", "TIBC", "IRON", "RETICCTPCT" in the last 72 hours. Sepsis Labs: Recent Labs  Lab 02/21/22 2048 02/21/22 2226 02/22/22 0209  WBC 7.9  --  8.1  LATICACIDVEN  --  0.5  --    Microbiology Recent Results (from the past 240 hour(s))  Resp Panel by RT-PCR (Flu A&B, Covid) Anterior Nasal Swab     Status: None   Collection Time: 02/21/22  8:33 PM   Specimen: Anterior Nasal Swab  Result Value Ref Range Status   SARS Coronavirus 2 by RT PCR NEGATIVE NEGATIVE Final    Comment: (NOTE)  SARS-CoV-2 target nucleic acids are NOT DETECTED.  The SARS-CoV-2 RNA is generally detectable in upper respiratory specimens during the acute phase of infection. The lowest concentration of SARS-CoV-2 viral copies this assay can detect is 138 copies/mL. A negative result does not preclude SARS-Cov-2 infection and should not be used as the sole basis for treatment or other patient management decisions. A negative result may occur with  improper specimen collection/handling, submission of specimen other than nasopharyngeal swab, presence of viral mutation(s) within the areas targeted by this assay, and inadequate number of viral copies(<138 copies/mL). A negative  result must be combined with clinical observations, patient history, and epidemiological information. The expected result is Negative.  Fact Sheet for Patients:  EntrepreneurPulse.com.au  Fact Sheet for Healthcare Providers:  IncredibleEmployment.be  This test is no t yet approved or cleared by the [place] FDA and  has been authorized for detection and/or diagnosis of SARS-CoV-2 by FDA under an Emergency Use Authorization (EUA). This EUA will remain  in effect (meaning this test can be used) for the duration of the COVID-19 declaration under Section 564(b)(1) of the Act, 21 U.S.C.section 360bbb-3(b)(1), unless the authorization is terminated  or revoked sooner.       Influenza A by PCR NEGATIVE NEGATIVE Final   Influenza B by PCR NEGATIVE NEGATIVE Final    Comment: (NOTE) The Xpert Xpress SARS-CoV-2/FLU/RSV plus assay is intended as an aid in the diagnosis of influenza from Nasopharyngeal swab specimens and should not be used as a sole basis for treatment. Nasal washings and aspirates are unacceptable for Xpert Xpress SARS-CoV-2/FLU/RSV testing.  Fact Sheet for Patients: EntrepreneurPulse.com.au  Fact Sheet for Healthcare Providers: IncredibleEmployment.be  This test is not yet approved or cleared by the [place] FDA and has been authorized for detection and/or diagnosis of SARS-CoV-2 by FDA under an Emergency Use Authorization (EUA). This EUA will remain in effect (meaning this test can be used) for the duration of the COVID-19 declaration under Section 564(b)(1) of the Act, 21 U.S.C. section 360bbb-3(b)(1), unless the authorization is terminated or revoked.  Performed at [facility], [place] [address], [place], [place] [address]   Blood culture (routine x 2)     Status: None (Preliminary result)   Collection Time: 02/21/22 10:26 PM   Specimen: Peripheral; Blood  Result Value Ref  Range Status   Specimen Description BLOOD RIGHT FOREARM  Final   Special Requests   Final    BOTTLES DRAWN AEROBIC AND ANAEROBIC Blood Culture results may not be optimal due to an excessive volume of blood received in culture bottles Performed at [facility] [address] [place], [place] [address]    Culture PENDING  Incomplete   Report Status PENDING  Incomplete     Medications:    hydrALAZINE  50 mg Oral Q8H   insulin aspart  0-6 Units Subcutaneous TID WC   levothyroxine  112 mcg Oral Daily   mycophenolate  360 mg Oral Daily   sodium bicarbonate  650 mg Oral BID   sodium zirconium cyclosilicate  10 g Oral Once   tacrolimus  1.5 mg Oral BID   Continuous Infusions:  azithromycin     cefTRIAXone (ROCEPHIN)  IV        LOS: 1 day   Charlynne Cousins  [facility]  02/22/2022, 7:02 AM

## 2022-02-22 NOTE — Progress Notes (Signed)
RT note- Patient was brought up to 5W with Bipap on standby, on 2L [place]. BBS L with exp wheezes, R clear diminished with a slight wheeze. Patient would benefit from some nebulizers, will contact MD.

## 2022-02-23 DIAGNOSIS — G9341 Metabolic encephalopathy: Secondary | ICD-10-CM | POA: Diagnosis not present

## 2022-02-23 DIAGNOSIS — J189 Pneumonia, unspecified organism: Secondary | ICD-10-CM | POA: Diagnosis not present

## 2022-02-23 DIAGNOSIS — E875 Hyperkalemia: Secondary | ICD-10-CM | POA: Diagnosis not present

## 2022-02-23 DIAGNOSIS — N179 Acute kidney failure, unspecified: Secondary | ICD-10-CM | POA: Diagnosis not present

## 2022-02-23 LAB — CBC WITH DIFFERENTIAL/PLATELET
Abs Immature Granulocytes: 0.13 10*3/uL — ABNORMAL HIGH (ref 0.00–0.07)
Basophils Absolute: 0 10*3/uL (ref 0.0–0.1)
Basophils Relative: 1 %
Eosinophils Absolute: 0.1 10*3/uL (ref 0.0–0.5)
Eosinophils Relative: 1 %
HCT: 25.2 % — ABNORMAL LOW (ref 39.0–52.0)
Hemoglobin: 7.5 g/dL — ABNORMAL LOW (ref 13.0–17.0)
Immature Granulocytes: 2 %
Lymphocytes Relative: 33 %
Lymphs Abs: 2.8 10*3/uL (ref 0.7–4.0)
MCH: 28.8 pg (ref 26.0–34.0)
MCHC: 29.8 g/dL — ABNORMAL LOW (ref 30.0–36.0)
MCV: 96.9 fL (ref 80.0–100.0)
Monocytes Absolute: 0.7 10*3/uL (ref 0.1–1.0)
Monocytes Relative: 9 %
Neutro Abs: 4.7 10*3/uL (ref 1.7–7.7)
Neutrophils Relative %: 54 %
Platelets: 275 10*3/uL (ref 150–400)
RBC: 2.6 MIL/uL — ABNORMAL LOW (ref 4.22–5.81)
RDW: 16.3 % — ABNORMAL HIGH (ref 11.5–15.5)
WBC: 8.5 10*3/uL (ref 4.0–10.5)
nRBC: 0.2 % (ref 0.0–0.2)

## 2022-02-23 LAB — BASIC METABOLIC PANEL
Anion gap: 6 (ref 5–15)
BUN: 87 mg/dL — ABNORMAL HIGH (ref 8–23)
CO2: 16 mmol/L — ABNORMAL LOW (ref 22–32)
Calcium: 7.7 mg/dL — ABNORMAL LOW (ref 8.9–10.3)
Chloride: 122 mmol/L — ABNORMAL HIGH (ref 98–111)
Creatinine, Ser: 4.25 mg/dL — ABNORMAL HIGH (ref 0.61–1.24)
GFR, Estimated: 14 mL/min — ABNORMAL LOW (ref >60)
Glucose, Bld: 130 mg/dL — ABNORMAL HIGH (ref 70–99)
Potassium: 5.6 mmol/L — ABNORMAL HIGH (ref 3.5–5.1)
Sodium: 144 mmol/L (ref 135–145)

## 2022-02-23 LAB — GLUCOSE, CAPILLARY
Glucose-Capillary: 151 mg/dL — ABNORMAL HIGH (ref 70–99)
Glucose-Capillary: 166 mg/dL — ABNORMAL HIGH (ref 70–99)
Glucose-Capillary: 204 mg/dL — ABNORMAL HIGH (ref 70–99)
Glucose-Capillary: 138 mg/dL — ABNORMAL HIGH (ref 70–99)

## 2022-02-23 LAB — MYCOPHENOLIC ACID (CELLCEPT)
MPA Glucuronide: 78 ug/mL (ref 15–125)
MPA: 1.7 ug/mL (ref 1.0–3.5)

## 2022-02-23 LAB — TACROLIMUS LEVEL: Tacrolimus (FK506) - LabCorp: 4.9 ng/mL (ref 2.0–20.0)

## 2022-02-23 MED ORDER — SODIUM POLYSTYRENE SULFONATE 15 GM/60ML PO SUSP
30.0000 g | Freq: Once | ORAL | Status: AC
  Administered 2022-02-23: 30 g via ORAL
  Filled 2022-02-23: qty 120

## 2022-02-23 MED ORDER — AMLODIPINE BESYLATE 10 MG PO TABS
10.0000 mg | ORAL_TABLET | Freq: Every day | ORAL | Status: DC
  Administered 2022-02-23 – 2022-02-28 (×6): 10 mg via ORAL
  Filled 2022-02-23 (×6): qty 1

## 2022-02-23 MED ORDER — SODIUM BICARBONATE 650 MG PO TABS
1300.0000 mg | ORAL_TABLET | Freq: Three times a day (TID) | ORAL | Status: DC
  Administered 2022-02-23 – 2022-02-28 (×15): 1300 mg via ORAL
  Filled 2022-02-23 (×15): qty 2

## 2022-02-23 MED ORDER — FUROSEMIDE 20 MG PO TABS
20.0000 mg | ORAL_TABLET | Freq: Every day | ORAL | Status: DC
  Administered 2022-02-23 – 2022-02-25 (×3): 20 mg via ORAL
  Filled 2022-02-23 (×3): qty 1

## 2022-02-23 MED ORDER — SODIUM BICARBONATE 8.4 % IV SOLN: INTRAVENOUS | Status: DC

## 2022-02-23 MED ORDER — ATORVASTATIN CALCIUM 10 MG PO TABS
10.0000 mg | ORAL_TABLET | Freq: Every day | ORAL | Status: DC
  Administered 2022-02-23 – 2022-02-28 (×6): 10 mg via ORAL
  Filled 2022-02-23 (×6): qty 1

## 2022-02-23 NOTE — Progress Notes (Signed)
Mobility Specialist Progress Note:   02/23/22 1117  Mobility  Activity Ambulated with assistance in hallway  Level of Assistance Standby assist, set-up cues, supervision of patient - no hands on  Assistive Device None  Distance Ambulated (ft) 500 ft  Activity Response Tolerated well  Mobility Referral Yes  $Mobility charge 1 Mobility   Pt received in bathroom and eager! Pt declined to do own pericare, requiring Max assist. No complaints of pain or SOB. Pt left in bed with all needs met, call bell in reach, and family in room.     Mobility Specialist-Acute Rehab Secure Chat only  

## 2022-02-23 NOTE — Progress Notes (Signed)
[facility] PROGRESS NOTE    Progress Note  Nathan Tyler  GYI:[id number] DOB: 02/04/1956 DOA: 02/21/2022 PCP: Celene Squibb, MD     Brief Narrative:   Nathan Tyler is an 66 y.o. male  past medical history significant for renal transplant in 2010 chronically immunosuppressed, chronic renal disease stage IV with a baseline creatinine around 3-4, diabetes mellitus type 2, essential hypertension admitted to [facility] for shortness of breath subjective fever and cough was found to have community-acquired pneumonia.    Assessment/Plan:   CAP (community acquired pneumonia) Has remained afebrile leukocytosis. Culture data has remained negative. Continue IV Rocephin and azithromycin.  Chronic kidney disease stage IV: Acute kidney injury has been ruled out. He was started on IV fluid hydration. Continue to hold diuretics. His creatinine is very close to baseline this morning.  Hyperkalemia: Was given Lokelma yesterday potassium remains high. We will give oral Kayexalate give him another dose of Lokelma, repeat basic metabolic panel in the morning. Continue bicarbonate orally at a higher dose Recheck basic metabolic panel tomorrow.  Non-anion gap metabolic acidosis: Really due to chronic renal disease. Increase oral bicarbonate tablets recheck basic metabolic panel in the morning.  Anemia of chronic renal disease: Ferritin of 55, likely due to chronic renal disease.  Continue to check hemoglobin intermittently check a CBC tomorrow.  Essential hypertension: Resume Coreg and Norvasc. Resume Lasix.  Chronic diastolic heart failure: Resume diuretics continue strict I's and O's.   DVT prophylaxis: lovenox Family Communication:none Status is: Inpatient Remains inpatient appropriate because: Community-acquired pneumonia hypokalemia    Code Status:     Code Status Orders  (From admission, onward)           Start     Ordered   02/21/22 2232  Full code  Continuous         02/21/22 2231           Code Status History     Date Active Date Inactive Code Status Order ID Comments User Context   05/31/2019 1110 06/16/2019 2317 Full Code [id number]  [place] Eric J, DO Inpatient         IV Access:   Peripheral IV   Procedures and diagnostic studies:   CT Chest Wo Contrast  Result Date: 02/22/2022 CLINICAL DATA:  Dyspnea, pneumonia EXAM: CT CHEST WITHOUT CONTRAST TECHNIQUE: Multidetector CT imaging of the chest was performed following the standard protocol without IV contrast. RADIATION DOSE REDUCTION: This exam was performed according to the departmental dose-optimization program which includes automated exposure control, adjustment of the mA and/or kV according to patient size and/or use of iterative reconstruction technique. COMPARISON:  06/03/2019 FINDINGS: Cardiovascular: Extensive multi-vessel coronary artery calcification. Global cardiac size within normal limits. No pericardial effusion. Central pulmonary arteries are mildly enlarged in keeping with changes of pulmonary arterial hypertension. Mild atherosclerotic calcification within the thoracic aorta. No aortic aneurysm. Mediastinum/Nodes: No enlarged mediastinal or axillary lymph nodes. Thyroid gland, trachea, and esophagus demonstrate no significant findings. Lungs/Pleura: Small bilateral pleural effusions are present with associated bibasilar atelectasis. There are bilateral basilar predominant ground-glass and reticular pulmonary infiltrates which appear distributed in the areas most severely affected on prior CT examination and likely represent residual post inflammatory scarring. There is, however, more focal consolidative infiltrate within the right lower lobe, axial image # 116/5, which appears new from prior examination may represent an acute infectious process. No pneumothorax. No central obstructing lesion. Upper Abdomen: No acute abnormality. Musculoskeletal: Multiple remote bilateral rib fractures  are identified.  No acute bone abnormality. No lytic or blastic bone lesion. IMPRESSION: 1. Extensive multi-vessel coronary artery calcification. 2. Small bilateral pleural effusions with associated bibasilar atelectasis. 3. Bilateral basilar predominant ground-glass and reticular pulmonary infiltrates which appear distributed in the areas most severely affected on prior CT examination and likely represent residual post inflammatory scarring. 4. More focal consolidative infiltrate within the right lower lobe, which appears new from prior examination may represent a superimposed acute infectious process. Follow-up examination 3 months would be helpful to document resolution. 5. Morphologic changes in keeping with pulmonary arterial hypertension. Aortic Atherosclerosis (ICD10-I70.0). Electronically Signed   By: Fidela Salisbury M.D.   On: 02/22/2022 02:56   CT Head Wo Contrast  Result Date: 02/22/2022 CLINICAL DATA:  Mental status change, unknown cause EXAM: CT HEAD WITHOUT CONTRAST TECHNIQUE: Contiguous axial images were obtained from the base of the skull through the vertex without intravenous contrast. RADIATION DOSE REDUCTION: This exam was performed according to the departmental dose-optimization program which includes automated exposure control, adjustment of the mA and/or kV according to patient size and/or use of iterative reconstruction technique. COMPARISON:  12/27/2021 FINDINGS: Brain: Normal anatomic configuration. Parenchymal volume loss is stable, though slightly advanced given the patient's age. Mild periventricular white matter changes are present likely reflecting the sequela of small vessel ischemia. Remote lacunar infarct within the anterior limb of the left internal capsule. No abnormal intra or extra-axial mass lesion or fluid collection. No abnormal mass effect or midline shift. No evidence of acute intracranial hemorrhage or infarct. Ventricular size is normal. Cerebellum unremarkable.  Vascular: No asymmetric hyperdense vasculature at the skull base. Skull: Intact Sinuses/Orbits: Left maxillary mucosal thickening noted. No air-fluid level. Remaining paranasal sinuses are clear. Orbits are unremarkable. Other: Mastoid air cells and middle ear cavities are clear. IMPRESSION: 1. No acute intracranial hemorrhage or infarct. 2. Stable mild senescent change. 3. Mild left maxillary sinus disease. Electronically Signed   By: Fidela Salisbury M.D.   On: 02/22/2022 02:47   DG Chest Port 1 View  Result Date: 02/21/2022 CLINICAL DATA:  Shortness of breath, dyspnea.  Hypoglycemia. EXAM: PORTABLE CHEST 1 VIEW COMPARISON:  PA Lat 07/16/2019 FINDINGS: Numerous overlying monitor wires. There is cardiomegaly and mild central vascular distension without overt edema. Heterogeneous opacities in the mid to lower lung fields with peripheral architectural distortion, are probably due to postinflammatory scarring but there is increased patchy haziness in the lung bases concerning for superimposed pneumonia. No pleural effusion is seen. The upper lung fields are generally clear Stable mediastinal widening probably due to lipomatosis. Thoracic spondylosis. IMPRESSION: 1. Increased patchy haziness in the lung bases concerning for pneumonia. Clinical correlation and radiographic follow-up recommended. 2. Cardiomegaly and mild central vascular distension without overt edema. 3. Chronic scarring in the mid to lower lung fields. Electronically Signed   By: Telford Nab M.D.   On: 02/21/2022 21:04     Medical Consultants:   None.   Subjective:    [name] relates he feels better.  Objective:    Vitals:   02/22/22 2000 02/22/22 2356 02/23/22 0339 02/23/22 0800  BP: (!) 151/72 (!) 150/68 (!) 150/67 (!) 166/71  Pulse: 69 69 60 70  Resp: '16 18 18 20  '$ Temp: 98.1 F (36.7 C) 98.6 F (37 C) 98.3 F (36.8 C) 98.1 F (36.7 C)  TempSrc: Oral Oral Oral Oral  SpO2: 92% 97% 96% 96%  Weight:      Height:        SpO2: 96 % O2  Flow Rate (L/min): 2 L/min FiO2 (%): 30 %   Intake/Output Summary (Last 24 hours) at 02/23/2022 1036 Last data filed at 02/23/2022 1000 Gross per 24 hour  Intake 240 ml  Output --  Net 240 ml   Filed Weights   02/22/22 0848  Weight: 104.3 kg    Exam: General exam: In no acute distress. Respiratory system: Good air movement and clear to auscultation. Cardiovascular system: S1 & S2 heard, RRR.  Gastrointestinal system: Abdomen is nondistended, soft and nontender.  Extremities: No pedal edema. Skin: No rashes, lesions or ulcers Psychiatry: Judgement and insight appear normal. Mood & affect appropriate.    Data Reviewed:    Labs: Basic Metabolic Panel: Recent Labs  Lab 02/21/22 2048 02/21/22 08-02-17 02/22/22 0209 02/23/22 0219  NA 147* 145 143 144  K 5.6* 5.1 5.8* 5.6*  CL 121*  --  120* 122*  CO2 16*  --  16* 16*  GLUCOSE 92  --  94 130*  BUN 88*  --  84* 87*  CREATININE 4.38*  --  4.30* 4.25*  CALCIUM 8.1*  --  8.1* 7.7*  MG 2.3  --  2.3  --   PHOS  --   --  6.3*  --    GFR Estimated Creatinine Clearance: 20.8 mL/min (A) (by C-G formula based on SCr of 4.25 mg/dL (H)). Liver Function Tests: Recent Labs  Lab 02/21/22 2048 02/22/22 0209  AST 16 16  ALT 15 16  ALKPHOS 72 74  BILITOT 0.4 0.3  PROT 5.4* 5.5*  ALBUMIN 3.3* 3.3*   No results for input(s): "LIPASE", "AMYLASE" in the last 168 hours. No results for input(s): "AMMONIA" in the last 168 hours. Coagulation profile Recent Labs  Lab 02/22/22 0829  INR 1.1   COVID-19 Labs  Recent Labs    02/22/22 0829  FERRITIN 155    Lab Results  Component Value Date   SARSCOV2NAA NEGATIVE 02/21/2022   [place] NEGATIVE 01/15/2019    CBC: Recent Labs  Lab 02/21/22 2048 02/21/22 02-Aug-2117 02/22/22 0209  WBC 7.9  --  8.1  NEUTROABS 4.5  --  6.6  HGB 7.8* 6.8* 7.5*  HCT 26.4* 20.0* 25.5*  MCV 98.5  --  97.7  PLT 274  --  253   Cardiac Enzymes: No results for input(s):  "CKTOTAL", "CKMB", "CKMBINDEX", "TROPONINI" in the last 168 hours. BNP (last 3 results) No results for input(s): "PROBNP" in the last 8760 hours. CBG: Recent Labs  Lab 02/22/22 0839 02/22/22 1145 02/22/22 1716 02/22/22 2115-08-03 02/23/22 0820  GLUCAP 98 155* 280* 134* 151*   D-Dimer: No results for input(s): "DDIMER" in the last 72 hours. Hgb A1c: Recent Labs    02/22/22 1823  HGBA1C 5.7*   Lipid Profile: No results for input(s): "CHOL", "HDL", "LDLCALC", "TRIG", "CHOLHDL", "LDLDIRECT" in the last 72 hours. Thyroid function studies: Recent Labs    02/21/22 02-Aug-2098  TSH 4.644*   Anemia work up: Recent Labs    02/22/22 0209 02/22/22 0829  VITAMINB12  --  277  FOLATE  --  11.6  FERRITIN  --  155  TIBC  --  260  IRON  --  33*  RETICCTPCT 3.2*  --    Sepsis Labs: Recent Labs  Lab 02/21/22 2048 02/21/22 2226 02/22/22 0209  PROCALCITON  --   --  0.15  WBC 7.9  --  8.1  LATICACIDVEN  --  0.5  --    Microbiology Recent Results (from the past 240 hour(s))  Resp  Panel by RT-PCR (Flu A&B, Covid) Anterior Nasal Swab     Status: None   Collection Time: 02/21/22  8:33 PM   Specimen: Anterior Nasal Swab  Result Value Ref Range Status   SARS Coronavirus 2 by RT PCR NEGATIVE NEGATIVE Final    Comment: (NOTE) SARS-CoV-2 target nucleic acids are NOT DETECTED.  The SARS-CoV-2 RNA is generally detectable in upper respiratory specimens during the acute phase of infection. The lowest concentration of SARS-CoV-2 viral copies this assay can detect is 138 copies/mL. A negative result does not preclude SARS-Cov-2 infection and should not be used as the sole basis for treatment or other patient management decisions. A negative result may occur with  improper specimen collection/handling, submission of specimen other than nasopharyngeal swab, presence of viral mutation(s) within the areas targeted by this assay, and inadequate number of viral copies(<138 copies/mL). A negative result  must be combined with clinical observations, patient history, and epidemiological information. The expected result is Negative.  Fact Sheet for Patients:  EntrepreneurPulse.com.au  Fact Sheet for Healthcare Providers:  IncredibleEmployment.be  This test is no t yet approved or cleared by the [place] FDA and  has been authorized for detection and/or diagnosis of SARS-CoV-2 by FDA under an Emergency Use Authorization (EUA). This EUA will remain  in effect (meaning this test can be used) for the duration of the COVID-19 declaration under Section 564(b)(1) of the Act, 21 U.S.C.section 360bbb-3(b)(1), unless the authorization is terminated  or revoked sooner.       Influenza A by PCR NEGATIVE NEGATIVE Final   Influenza B by PCR NEGATIVE NEGATIVE Final    Comment: (NOTE) The Xpert Xpress SARS-CoV-2/FLU/RSV plus assay is intended as an aid in the diagnosis of influenza from Nasopharyngeal swab specimens and should not be used as a sole basis for treatment. Nasal washings and aspirates are unacceptable for Xpert Xpress SARS-CoV-2/FLU/RSV testing.  Fact Sheet for Patients: EntrepreneurPulse.com.au  Fact Sheet for Healthcare Providers: IncredibleEmployment.be  This test is not yet approved or cleared by the [place] FDA and has been authorized for detection and/or diagnosis of SARS-CoV-2 by FDA under an Emergency Use Authorization (EUA). This EUA will remain in effect (meaning this test can be used) for the duration of the COVID-19 declaration under Section 564(b)(1) of the Act, 21 U.S.C. section 360bbb-3(b)(1), unless the authorization is terminated or revoked.  Performed at [facility], [place] [address] [place], [place] [address]   Blood culture (routine x 2)     Status: None (Preliminary result)   Collection Time: 02/21/22 10:26 PM   Specimen: BLOOD RIGHT FOREARM  Result Value Ref Range  Status   Specimen Description BLOOD RIGHT FOREARM  Final   Special Requests   Final    BOTTLES DRAWN AEROBIC AND ANAEROBIC Blood Culture results may not be optimal due to an excessive volume of blood received in culture bottles   Culture   Final    NO GROWTH 2 DAYS Performed at [facility], [place] [address] [place], [place] [address]    Report Status PENDING  Incomplete  Blood culture (routine x 2)     Status: None (Preliminary result)   Collection Time: 02/21/22 10:36 PM   Specimen: BLOOD LEFT FOREARM  Result Value Ref Range Status   Specimen Description BLOOD LEFT FOREARM  Final   Special Requests   Final    BOTTLES DRAWN AEROBIC AND ANAEROBIC Blood Culture adequate volume   Culture   Final  NO GROWTH 2 DAYS Performed at [facility], [place] [address], [place], [place] [address]    Report Status PENDING  Incomplete     Medications:    carvedilol  12.5 mg Oral BID   insulin aspart  0-6 Units Subcutaneous TID WC   levothyroxine  112 mcg Oral Daily   mycophenolate  180 mg Oral BID   sodium bicarbonate  650 mg Oral BID   sodium polystyrene  30 g Oral Once   sodium zirconium cyclosilicate  10 g Oral Once   tacrolimus  1.5 mg Oral BID   Continuous Infusions:  azithromycin 500 mg (02/22/22 1643)   cefTRIAXone (ROCEPHIN)  IV 1 g (02/22/22 1636)      LOS: 2 days   Charlynne Cousins  [facility]  02/23/2022, 10:36 AM

## 2022-02-23 NOTE — Progress Notes (Signed)
  Transition of Care [facility]) Screening Note   Patient Details  Name: XAYDEN LINSEY Date of Birth: 17-Aug-1955   Transition of Care [facility]) CM/SW Contact:    Benard Halsted, [place] Phone Number: 02/23/2022, 10:43 AM    Transition of Care Department [facility]) has reviewed patient and no TOC needs have been identified at this time. We will continue to monitor patient advancement through interdisciplinary progression rounds. If new patient transition needs arise, please place a TOC consult.

## 2022-02-24 DIAGNOSIS — J189 Pneumonia, unspecified organism: Secondary | ICD-10-CM | POA: Diagnosis not present

## 2022-02-24 DIAGNOSIS — E875 Hyperkalemia: Secondary | ICD-10-CM | POA: Diagnosis not present

## 2022-02-24 LAB — GLUCOSE, CAPILLARY
Glucose-Capillary: 128 mg/dL — ABNORMAL HIGH (ref 70–99)
Glucose-Capillary: 197 mg/dL — ABNORMAL HIGH (ref 70–99)
Glucose-Capillary: 184 mg/dL — ABNORMAL HIGH (ref 70–99)
Glucose-Capillary: 187 mg/dL — ABNORMAL HIGH (ref 70–99)
Glucose-Capillary: 206 mg/dL — ABNORMAL HIGH (ref 70–99)

## 2022-02-24 LAB — BASIC METABOLIC PANEL
Anion gap: 9 (ref 5–15)
BUN: 71 mg/dL — ABNORMAL HIGH (ref 8–23)
CO2: 16 mmol/L — ABNORMAL LOW (ref 22–32)
Calcium: 8.1 mg/dL — ABNORMAL LOW (ref 8.9–10.3)
Chloride: 118 mmol/L — ABNORMAL HIGH (ref 98–111)
Creatinine, Ser: 3.69 mg/dL — ABNORMAL HIGH (ref 0.61–1.24)
GFR, Estimated: 17 mL/min — ABNORMAL LOW (ref >60)
Glucose, Bld: 155 mg/dL — ABNORMAL HIGH (ref 70–99)
Potassium: 5.1 mmol/L (ref 3.5–5.1)
Sodium: 143 mmol/L (ref 135–145)

## 2022-02-24 MED ORDER — CEFDINIR 300 MG PO CAPS
300.0000 mg | ORAL_CAPSULE | Freq: Two times a day (BID) | ORAL | Status: DC
  Administered 2022-02-24 – 2022-02-25 (×3): 300 mg via ORAL
  Filled 2022-02-24 (×3): qty 1

## 2022-02-24 MED ORDER — SODIUM POLYSTYRENE SULFONATE 15 GM/60ML PO SUSP
30.0000 g | Freq: Once | ORAL | Status: AC
  Administered 2022-02-24: 30 g via ORAL
  Filled 2022-02-24: qty 120

## 2022-02-24 NOTE — Progress Notes (Signed)
Pt tolerating well off of bipap at this time.

## 2022-02-24 NOTE — Evaluation (Signed)
Occupational Therapy Evaluation Patient Details Name: Nathan Tyler MRN: [id number] DOB: 1955-04-28 Today's Date: 02/24/2022   History of Present Illness 66 y.o. male  past medical history significant for renal transplant chronic renal disease stage IV, diabetes mellitus type 2, essential hypertension admitted to [facility] 02/21/22 for shortness of breath subjective fever and cough was found to have community-acquired pneumonia.   Clinical Impression   Pt presents with decline in function and safety with ADLs and ADL mobility with impaired balance and endurance. PTA pt lived at home with his wife and was Ind with ADLs/selfcare, IADLS, mobility and was driving.  Pt mobilizes in room for ADL mobility tasks with RW for safety min guard level. SpO2 87-88% on RA, up to 93%. Pt would benefit from acute OT services to address impairments to maximize level of function and safety         Recommendations for follow up therapy are one component of a multi-disciplinary discharge planning process, led by the attending physician.  Recommendations may be updated based on patient status, additional functional criteria and insurance authorization.   Follow Up Recommendations  Home health OT    Assistance Recommended at Discharge PRN  Patient can return home with the following A little help with bathing/dressing/bathroom;Assistance with cooking/housework;Assist for transportation;A little help with walking and/or transfers    Functional Status Assessment  Patient has had a recent decline in their functional status and demonstrates the ability to make significant improvements in function in a reasonable and predictable amount of time.  Equipment Recommendations  None recommended by OT    Recommendations for Other Services       Precautions / Restrictions Precautions Precautions: None Restrictions Weight Bearing Restrictions: No      Mobility Bed Mobility               General bed mobility  comments: pt in recliner upon arrival    Transfers Overall transfer level: Needs assistance Equipment used: Rolling walker (2 wheels) Transfers: Sit to/from Stand Sit to Stand: Independent           General transfer comment: Min guard for safety      Balance Overall balance assessment: Needs assistance Sitting-balance support: No upper extremity supported, Feet supported Sitting balance-Leahy Scale: Good     Standing balance support: No upper extremity supported, During functional activity Standing balance-Leahy Scale: Fair                             ADL either performed or assessed with clinical judgement   ADL Overall ADL's : Needs assistance/impaired Eating/Feeding: Independent   Grooming: Wash/dry hands;Wash/dry face;Min guard;Standing   Upper Body Bathing: Set up;Supervision/ safety   Lower Body Bathing: Min guard;Sit to/from stand   Upper Body Dressing : Set up;Supervision/safety   Lower Body Dressing: Min guard;Sit to/from stand   Toilet Transfer: Min guard   Toileting- Water quality scientist and Hygiene: Min guard   Tub/ Banker: Min guard   Functional mobility during ADLs: Min guard;Supervision/safety       Vision Baseline Vision/History: 1 Wears glasses Patient Visual Report: No change from baseline       Perception     Praxis      Pertinent Vitals/Pain Pain Assessment Pain Assessment: No/denies pain     Hand Dominance Left   Extremity/Trunk Assessment Upper Extremity Assessment Upper Extremity Assessment: Overall WFL for tasks assessed   Lower Extremity Assessment Lower Extremity Assessment: Defer to PT  evaluation       Communication     Cognition Arousal/Alertness: Awake/alert Behavior During Therapy: WFL for tasks assessed/performed Overall Cognitive Status: Within Functional Limits for tasks assessed                                       General Comments       Exercises      Shoulder Instructions      Home Living Family/patient expects to be discharged to:: Private residence Living Arrangements: Spouse/significant other       Entrance Stairs-Number of Steps: 3 Entrance Stairs-Rails: None Home Layout: Two level;Able to live on main level with bedroom/bathroom;Bed/bath upstairs Alternate Level Stairs-Number of Steps: 13 Alternate Level Stairs-Rails: Right Bathroom Shower/Tub: Occupational psychologist: Standard     Home Equipment: Conservation officer, nature (2 wheels)   Additional Comments: Occasional use of RW      Prior Functioning/Environment Prior Level of Function : Needs assist;History of Falls (last six months)       Physical Assist : ADLs (physical)   ADLs (physical): Grooming;Bathing   ADLs Comments: States aides assist with cleaning, making food, and bathing/grooming 2x/week        OT Problem List: Decreased activity tolerance;Impaired balance (sitting and/or standing)      OT Treatment/Interventions: Self-care/ADL training;Therapeutic activities;Energy conservation;DME and/or AE instruction;Balance training;Patient/family education    OT Goals(Current goals can be found in the care plan section) Acute Rehab OT Goals Patient Stated Goal: go home OT Goal Formulation: With patient Time For Goal Achievement: 03/10/22 Potential to Achieve Goals: Good ADL Goals Pt Will Perform Grooming: with supervision;with modified independence;standing Pt Will Perform Lower Body Bathing: with supervision;with modified independence;sit to/from stand Pt Will Perform Lower Body Dressing: with supervision;with modified independence Pt Will Transfer to Toilet: with supervision;with modified independence;ambulating Pt Will Perform Toileting - Clothing Manipulation and hygiene: with supervision;with modified independence;sit to/from stand Pt Will Perform Tub/Shower Transfer: with supervision;with modified independence;ambulating Additional ADL Goal #1:  Pt will verbalize and demo 3 ECTs during ADLs and ADLmobility tasks  OT Frequency: Min 2X/week    Co-evaluation              AM-PAC OT "6 Clicks" Daily Activity     Outcome Measure Help from another person eating meals?: None Help from another person taking care of personal grooming?: A Little Help from another person toileting, which includes using toliet, bedpan, or urinal?: A Little Help from another person bathing (including washing, rinsing, drying)?: A Little Help from another person to put on and taking off regular upper body clothing?: None Help from another person to put on and taking off regular lower body clothing?: A Little 6 Click Score: 20   End of Session Equipment Utilized During Treatment: Gait belt;Rolling walker (2 wheels)  Activity Tolerance: Patient tolerated treatment well Patient left: in chair;with call bell/phone within reach  OT Visit Diagnosis: Unsteadiness on feet (R26.81)                Time: 6629-4765 OT Time Calculation (min): 17 min Charges:  OT General Charges $OT Visit: 1 Visit OT Evaluation $OT Eval Low Complexity: 1 Low    Britt Bottom 02/24/2022, 12:22 PM

## 2022-02-24 NOTE — Progress Notes (Signed)
[facility] PROGRESS NOTE    Progress Note  Nathan Tyler  JTT:[id number] DOB: [birth year] DOA: 02/21/2022 PCP: Celene Squibb, MD     Brief Narrative:   Nathan Tyler is an 66 y.o. male  past medical history significant for renal transplant in 2010 chronically immunosuppressed, chronic renal disease stage IV with a baseline creatinine around 3-4, diabetes mellitus type 2, essential hypertension admitted to [facility] for shortness of breath subjective fever and cough was found to have community-acquired pneumonia.   Assessment/Plan:   CAP (community acquired pneumonia): Has remained afebrile leukocytosis.  He has been weaned to room air. Culture data has remained negative. Transition to oral antibiotics. PT OT evaluation is pending.  Chronic kidney disease stage IV: Acute kidney injury has been ruled out. He was started on IV fluid hydration. Continue to hold diuretics. His creatinine is very close to baseline this morning.  Hyperkalemia: Was given Kayexalate give him another dose of Lokelma. Continue bicarbonate at high dose. Basic metabolic panel is pending.  Non-anion gap metabolic acidosis: Really due to chronic renal disease. Increase oral bicarbonate tablets recheck basic metabolic panel in the morning.  Anemia of chronic renal disease: Ferritin of 55, likely due to chronic renal disease.  Continue to check hemoglobin intermittently CBC is pending this morning. Hemoglobin has remained relatively stable  Essential hypertension: Continue Coreg, Norvasc and Lasix.  Chronic diastolic heart failure: Resume diuretics continue strict I's and O's.   DVT prophylaxis: lovenox Family Communication:none Status is: Inpatient Remains inpatient appropriate because: Community-acquired pneumonia hypokalemia    Code Status:     Code Status Orders  (From admission, onward)           Start     Ordered   02/21/22 2232  Full code  Continuous        02/21/22 2231            Code Status History     Date Active Date Inactive Code Status Order ID Comments User Context   05/31/2019 1110 06/16/2019 2317 Full Code [id number]  [place] Eric J, DO Inpatient         IV Access:   Peripheral IV   Procedures and diagnostic studies:   No results found.   Medical Consultants:   None.   Subjective:    Nathan Tyler had multiple bowel movements yesterday, he relates his breathing is significantly better this morning.   Objective:    Vitals:   02/24/22 0000 02/24/22 0400 02/24/22 0800 02/24/22 0903  BP: (!) 142/71 (!) 150/57 (!) 157/64   Pulse: 64 64 63 64  Resp: '20 20 18 '$ (!) 25  Temp: 98.2 F (36.8 C) 97.8 F (36.6 C) (!) 97.2 F (36.2 C)   TempSrc: Oral Oral Axillary   SpO2: 93% 94% 93% 95%  Weight:      Height:       SpO2: 95 % O2 Flow Rate (L/min): 2 L/min FiO2 (%): 30 %   Intake/Output Summary (Last 24 hours) at 02/24/2022 1006 Last data filed at 02/24/2022 0946 Gross per 24 hour  Intake 1656.47 ml  Output --  Net 1656.47 ml    Filed Weights   02/22/22 0848  Weight: 104.3 kg    Exam: General exam: In no acute distress. Respiratory system: Good air movement and clear to auscultation. Cardiovascular system: S1 & S2 heard, RRR. No JVD. Gastrointestinal system: Abdomen is nondistended, soft and nontender.  Extremities: No pedal edema. Skin: No rashes, lesions or ulcers Psychiatry:  Judgement and insight appear normal. Mood & affect appropriate.   Data Reviewed:    Labs: Basic Metabolic Panel: Recent Labs  Lab 02/21/22 2048 02/21/22 Jul 07, 2117 02/22/22 0209 02/23/22 0219  NA 147* 145 143 144  K 5.6* 5.1 5.8* 5.6*  CL 121*  --  120* 122*  CO2 16*  --  16* 16*  GLUCOSE 92  --  94 130*  BUN 88*  --  84* 87*  CREATININE 4.38*  --  4.30* 4.25*  CALCIUM 8.1*  --  8.1* 7.7*  MG 2.3  --  2.3  --   PHOS  --   --  6.3*  --     GFR Estimated Creatinine Clearance: 20.8 mL/min (A) (by C-G formula based on SCr of 4.25  mg/dL (H)). Liver Function Tests: Recent Labs  Lab 02/21/22 2048 02/22/22 0209  AST 16 16  ALT 15 16  ALKPHOS 72 74  BILITOT 0.4 0.3  PROT 5.4* 5.5*  ALBUMIN 3.3* 3.3*    No results for input(s): "LIPASE", "AMYLASE" in the last 168 hours. No results for input(s): "AMMONIA" in the last 168 hours. Coagulation profile Recent Labs  Lab 02/22/22 0829  INR 1.1    COVID-19 Labs  Recent Labs    02/22/22 0829  FERRITIN 155     Lab Results  Component Value Date   SARSCOV2NAA NEGATIVE 02/21/2022   [place] NEGATIVE 01/15/2019    CBC: Recent Labs  Lab 02/21/22 2048 02/21/22 07/07/2117 02/22/22 0209 02/23/22 1122  WBC 7.9  --  8.1 8.5  NEUTROABS 4.5  --  6.6 4.7  HGB 7.8* 6.8* 7.5* 7.5*  HCT 26.4* 20.0* 25.5* 25.2*  MCV 98.5  --  97.7 96.9  PLT 274  --  253 275    Cardiac Enzymes: No results for input(s): "CKTOTAL", "CKMB", "CKMBINDEX", "TROPONINI" in the last 168 hours. BNP (last 3 results) No results for input(s): "PROBNP" in the last 8760 hours. CBG: Recent Labs  Lab 02/23/22 0820 02/23/22 1143 02/23/22 1636 02/23/22 07-08-06 02/24/22 0850  GLUCAP 151* 166* 204* 138* 128*    D-Dimer: No results for input(s): "DDIMER" in the last 72 hours. Hgb A1c: Recent Labs    02/22/22 1823  HGBA1C 5.7*    Lipid Profile: No results for input(s): "CHOL", "HDL", "LDLCALC", "TRIG", "CHOLHDL", "LDLDIRECT" in the last 72 hours. Thyroid function studies: Recent Labs    02/21/22 07-Jul-2098  TSH 4.644*    Anemia work up: Recent Labs    02/22/22 0209 02/22/22 0829  VITAMINB12  --  277  FOLATE  --  11.6  FERRITIN  --  155  TIBC  --  260  IRON  --  33*  RETICCTPCT 3.2*  --     Sepsis Labs: Recent Labs  Lab 02/21/22 2048 02/21/22 2226 02/22/22 0209 02/23/22 1122  PROCALCITON  --   --  0.15  --   WBC 7.9  --  8.1 8.5  LATICACIDVEN  --  0.5  --   --     Microbiology Recent Results (from the past 240 hour(s))  Resp Panel by RT-PCR (Flu A&B, Covid) Anterior  Nasal Swab     Status: None   Collection Time: 02/21/22  8:33 PM   Specimen: Anterior Nasal Swab  Result Value Ref Range Status   SARS Coronavirus 2 by RT PCR NEGATIVE NEGATIVE Final    Comment: (NOTE) SARS-CoV-2 target nucleic acids are NOT DETECTED.  The SARS-CoV-2 RNA is generally detectable in upper respiratory specimens during the acute  phase of infection. The lowest concentration of SARS-CoV-2 viral copies this assay can detect is 138 copies/mL. A negative result does not preclude SARS-Cov-2 infection and should not be used as the sole basis for treatment or other patient management decisions. A negative result may occur with  improper specimen collection/handling, submission of specimen other than nasopharyngeal swab, presence of viral mutation(s) within the areas targeted by this assay, and inadequate number of viral copies(<138 copies/mL). A negative result must be combined with clinical observations, patient history, and epidemiological information. The expected result is Negative.  Fact Sheet for Patients:  EntrepreneurPulse.com.au  Fact Sheet for Healthcare Providers:  IncredibleEmployment.be  This test is no t yet approved or cleared by the [place] FDA and  has been authorized for detection and/or diagnosis of SARS-CoV-2 by FDA under an Emergency Use Authorization (EUA). This EUA will remain  in effect (meaning this test can be used) for the duration of the COVID-19 declaration under Section 564(b)(1) of the Act, 21 U.S.C.section 360bbb-3(b)(1), unless the authorization is terminated  or revoked sooner.       Influenza A by PCR NEGATIVE NEGATIVE Final   Influenza B by PCR NEGATIVE NEGATIVE Final    Comment: (NOTE) The Xpert Xpress SARS-CoV-2/FLU/RSV plus assay is intended as an aid in the diagnosis of influenza from Nasopharyngeal swab specimens and should not be used as a sole basis for treatment. Nasal washings  and aspirates are unacceptable for Xpert Xpress SARS-CoV-2/FLU/RSV testing.  Fact Sheet for Patients: EntrepreneurPulse.com.au  Fact Sheet for Healthcare Providers: IncredibleEmployment.be  This test is not yet approved or cleared by the [place] FDA and has been authorized for detection and/or diagnosis of SARS-CoV-2 by FDA under an Emergency Use Authorization (EUA). This EUA will remain in effect (meaning this test can be used) for the duration of the COVID-19 declaration under Section 564(b)(1) of the Act, 21 U.S.C. section 360bbb-3(b)(1), unless the authorization is terminated or revoked.  Performed at [facility], [place] [address], [place], [place] [address]   Blood culture (routine x 2)     Status: None (Preliminary result)   Collection Time: 02/21/22 10:26 PM   Specimen: BLOOD RIGHT FOREARM  Result Value Ref Range Status   Specimen Description BLOOD RIGHT FOREARM  Final   Special Requests   Final    BOTTLES DRAWN AEROBIC AND ANAEROBIC Blood Culture results may not be optimal due to an excessive volume of blood received in culture bottles   Culture   Final    NO GROWTH 3 DAYS Performed at [facility], [place] [address] [place], [place] [address]    Report Status PENDING  Incomplete  Blood culture (routine x 2)     Status: None (Preliminary result)   Collection Time: 02/21/22 10:36 PM   Specimen: BLOOD LEFT FOREARM  Result Value Ref Range Status   Specimen Description BLOOD LEFT FOREARM  Final   Special Requests   Final    BOTTLES DRAWN AEROBIC AND ANAEROBIC Blood Culture adequate volume   Culture   Final    NO GROWTH 3 DAYS Performed at [facility], [place] [address] [place], [place] [address]    Report Status PENDING  Incomplete     Medications:    amLODipine  10 mg Oral Daily   atorvastatin  10 mg Oral Daily   carvedilol  12.5 mg Oral BID   furosemide  20 mg Oral Daily   insulin aspart  0-6 Units  Subcutaneous TID  WC   levothyroxine  112 mcg Oral Daily   mycophenolate  180 mg Oral BID   sodium bicarbonate  1,300 mg Oral TID   sodium zirconium cyclosilicate  10 g Oral Once   tacrolimus  1.5 mg Oral BID   Continuous Infusions:  azithromycin Stopped (02/23/22 1718)   cefTRIAXone (ROCEPHIN)  IV Stopped (02/23/22 1823)      LOS: 3 days   Charlynne Cousins  [facility]  02/24/2022, 10:06 AM

## 2022-02-24 NOTE — Evaluation (Signed)
Physical Therapy Evaluation Patient Details Name: Nathan Tyler MRN: [id number] DOB: 09-30-1955 Today's Date: 02/24/2022  History of Present Illness  66 y.o. male  past medical history significant for renal transplant chronic renal disease stage IV, diabetes mellitus type 2, essential hypertension admitted to [facility] 02/21/22 for shortness of breath subjective fever and cough was found to have community-acquired pneumonia.  Clinical Impression  Pt admitted with above diagnosis. Ambulates with RW for stability at min guard level. SpO2 87% on room air, up to 91% on 2L supplemental O2 during ambulatory bout with mod DOE. No overt LOB while using device but mild instability noted and would benefit from HHPT at d/c.  Pt currently with functional limitations due to the deficits listed below (see PT Problem List). Pt will benefit from skilled PT to increase their independence and safety with mobility to allow discharge to the venue listed below.          Recommendations for follow up therapy are one component of a multi-disciplinary discharge planning process, led by the attending physician.  Recommendations may be updated based on patient status, additional functional criteria and insurance authorization.  Follow Up Recommendations Home health PT      Assistance Recommended at Discharge Intermittent Supervision/Assistance  Patient can return home with the following  A little help with walking and/or transfers;A little help with bathing/dressing/bathroom;Assistance with cooking/housework;Assist for transportation;Help with stairs or ramp for entrance    Equipment Recommendations None recommended by PT  Recommendations for Other Services       Functional Status Assessment Patient has had a recent decline in their functional status and demonstrates the ability to make significant improvements in function in a reasonable and predictable amount of time.     Precautions / Restrictions  Precautions Precautions: None Restrictions Weight Bearing Restrictions: No      Mobility  Bed Mobility Overal bed mobility: Modified Independent             General bed mobility comments: extra time    Transfers Overall transfer level: Needs assistance Equipment used: Rolling walker (2 wheels) Transfers: Sit to/from Stand Sit to Stand: Min guard           General transfer comment: Min guard for safety, some posterior instability, corrected with cues for anterior weight shift and use of RW upon standing.    Ambulation/Gait Ambulation/Gait assistance: Min guard Gait Distance (Feet): 200 Feet Assistive device: Rolling walker (2 wheels) Gait Pattern/deviations: Step-through pattern, Decreased stride length, Drifts right/left Gait velocity: wnl Gait velocity interpretation: >2.62 ft/sec, indicative of community ambulatory   General Gait Details: Minor drift Lt and Rt with RW, cues to avoid lifting when turning. Overall no overt LOB during bout. SpO2 down to 87% on RA, increased to 91% with 2L supplemental O2. Moderate DOE.  Stairs            Wheelchair Mobility    Modified Rankin (Stroke Patients Only)       Balance Overall balance assessment: Needs assistance Sitting-balance support: No upper extremity supported, Feet supported Sitting balance-Leahy Scale: Good     Standing balance support: No upper extremity supported, During functional activity Standing balance-Leahy Scale: Fair                               Pertinent Vitals/Pain Pain Assessment Pain Assessment: No/denies pain    Home Living Family/patient expects to be discharged to:: Private residence Living Arrangements: Spouse/significant other Available  Help at Discharge: Family;Available 24 hours/day Type of Home: House Home Access: Stairs to enter Entrance Stairs-Rails: None Entrance Stairs-Number of Steps: 3 Alternate Level Stairs-Number of Steps: 13 Home Layout: Two  level;Able to live on main level with bedroom/bathroom;Bed/bath upstairs Home Equipment: Conservation officer, nature (2 wheels) Additional Comments: Occasional use of RW    Prior Function Prior Level of Function : Needs assist;History of Falls (last six months)       Physical Assist : ADLs (physical)   ADLs (physical): Grooming;Bathing   ADLs Comments: States aides assist with cleaning, making food, and bathing/grooming 2x/week     Hand Dominance   Dominant Hand: Left    Extremity/Trunk Assessment   Upper Extremity Assessment Upper Extremity Assessment: Defer to OT evaluation    Lower Extremity Assessment Lower Extremity Assessment: Generalized weakness       Communication   Communication: No difficulties  Cognition Arousal/Alertness: Awake/alert Behavior During Therapy: WFL for tasks assessed/performed Overall Cognitive Status: Within Functional Limits for tasks assessed                                          General Comments General comments (skin integrity, edema, etc.): SpO2 at rest 89-90% on room air    Exercises     Assessment/Plan    PT Assessment Patient needs continued PT services  PT Problem List Decreased strength;Decreased activity tolerance;Decreased balance;Decreased mobility;Decreased knowledge of use of DME;Cardiopulmonary status limiting activity;Obesity       PT Treatment Interventions DME instruction;Gait training;Stair training;Functional mobility training;Therapeutic activities;Therapeutic exercise;Balance training;Neuromuscular re-education    PT Goals (Current goals can be found in the Care Plan section)  Acute Rehab PT Goals Patient Stated Goal: Get well go home PT Goal Formulation: With patient Time For Goal Achievement: 03/10/22 Potential to Achieve Goals: Good    Frequency Min 3X/week     Co-evaluation               AM-PAC PT "6 Clicks" Mobility  Outcome Measure Help needed turning from your back to your side  while in a flat bed without using bedrails?: None Help needed moving from lying on your back to sitting on the side of a flat bed without using bedrails?: None Help needed moving to and from a bed to a chair (including a wheelchair)?: None Help needed standing up from a chair using your arms (e.g., wheelchair or bedside chair)?: A Little Help needed to walk in hospital room?: A Little Help needed climbing 3-5 steps with a railing? : A Little 6 Click Score: 21    End of Session Equipment Utilized During Treatment: Gait belt;Oxygen Activity Tolerance: Patient tolerated treatment well Patient left: in chair;with call bell/phone within reach;with chair alarm set Nurse Communication: Mobility status PT Visit Diagnosis: Unsteadiness on feet (R26.81);Muscle weakness (generalized) (M62.81)    Time: 3007-6226 PT Time Calculation (min) (ACUTE ONLY): 44 min   Charges:   PT Evaluation $PT Eval Low Complexity: 1 Low PT Treatments $Gait Training: 8-22 mins $Therapeutic Activity: 8-22 mins        Candie Mile, PT, DPT Physical Therapist Acute Rehabilitation Services [place] 02/24/2022, 10:33 AM

## 2022-02-24 NOTE — Plan of Care (Signed)

## 2022-02-25 DIAGNOSIS — E875 Hyperkalemia: Secondary | ICD-10-CM | POA: Diagnosis not present

## 2022-02-25 DIAGNOSIS — J189 Pneumonia, unspecified organism: Secondary | ICD-10-CM | POA: Diagnosis not present

## 2022-02-25 LAB — BASIC METABOLIC PANEL
Anion gap: 7 (ref 5–15)
BUN: 75 mg/dL — ABNORMAL HIGH (ref 8–23)
CO2: 19 mmol/L — ABNORMAL LOW (ref 22–32)
Calcium: 7.8 mg/dL — ABNORMAL LOW (ref 8.9–10.3)
Chloride: 114 mmol/L — ABNORMAL HIGH (ref 98–111)
Creatinine, Ser: 3.91 mg/dL — ABNORMAL HIGH (ref 0.61–1.24)
GFR, Estimated: 16 mL/min — ABNORMAL LOW (ref >60)
Glucose, Bld: 183 mg/dL — ABNORMAL HIGH (ref 70–99)
Potassium: 5.3 mmol/L — ABNORMAL HIGH (ref 3.5–5.1)
Sodium: 140 mmol/L (ref 135–145)

## 2022-02-25 LAB — GLUCOSE, CAPILLARY
Glucose-Capillary: 152 mg/dL — ABNORMAL HIGH (ref 70–99)
Glucose-Capillary: 194 mg/dL — ABNORMAL HIGH (ref 70–99)
Glucose-Capillary: 163 mg/dL — ABNORMAL HIGH (ref 70–99)
Glucose-Capillary: 138 mg/dL — ABNORMAL HIGH (ref 70–99)

## 2022-02-25 MED ORDER — SODIUM ZIRCONIUM CYCLOSILICATE 5 G PO PACK: 5.0000 g | PACK | Freq: Two times a day (BID) | ORAL | Status: DC

## 2022-02-25 MED ORDER — SODIUM POLYSTYRENE SULFONATE 15 GM/60ML PO SUSP
30.0000 g | Freq: Two times a day (BID) | ORAL | Status: AC
  Administered 2022-02-25 (×2): 30 g via ORAL
  Filled 2022-02-25 (×2): qty 120

## 2022-02-25 MED ORDER — SODIUM ZIRCONIUM CYCLOSILICATE 10 G PO PACK
10.0000 g | PACK | Freq: Two times a day (BID) | ORAL | Status: DC
  Administered 2022-02-25 – 2022-02-26 (×3): 10 g via ORAL
  Filled 2022-02-25 (×3): qty 1

## 2022-02-25 MED ORDER — CEFDINIR 300 MG PO CAPS
300.0000 mg | ORAL_CAPSULE | Freq: Two times a day (BID) | ORAL | Status: AC
  Administered 2022-02-25 – 2022-02-27 (×4): 300 mg via ORAL
  Filled 2022-02-25 (×4): qty 1

## 2022-02-25 NOTE — Progress Notes (Signed)
Mobility Specialist Progress Note:   02/25/22 1339  Mobility  Activity Ambulated with assistance in hallway  Level of Assistance Standby assist, set-up cues, supervision of patient - no hands on  Assistive Device None  Distance Ambulated (ft) 500 ft  Activity Response Tolerated well  Mobility Referral Yes  $Mobility charge 1 Mobility   Pt received in bed and agreeable. No complaints. Pt left on [facility] with all needs met and call bell in reach. RN notified.   [place] Mobility Specialist-Acute Rehab Secure Chat only

## 2022-02-25 NOTE — Progress Notes (Signed)
[facility] PROGRESS NOTE    Progress Note  Nathan Tyler  VVO:[id number] DOB: 11/19/1955 DOA: 02/21/2022 PCP: Celene Squibb, MD     Brief Narrative:   Nathan Tyler is an 66 y.o. male  past medical history significant for renal transplant in 2010 chronically immunosuppressed, chronic renal disease stage IV with a baseline creatinine around 3-4, diabetes mellitus type 2, essential hypertension admitted to [facility] for shortness of breath subjective fever and cough was found to have community-acquired pneumonia.   Assessment/Plan:   CAP (community acquired pneumonia): Has remained afebrile leukocytosis.  He has been weaned to room air. Culture data has remained negative. [place] to complete antibiotics in house. PT OT evaluated the patient, he will need to go home with home health.  Chronic kidney disease stage IV: Acute kidney injury has been ruled out. He was started on IV fluid hydration. His creatinine is very close to baseline this morning. Hold Lasix  Hyperkalemia: Potassium is high again today. Continue high-dose Kayexalate and started on Lokelma. Recheck a basic metabolic panel in the morning. Continue oral bicarbonate tablets. He will probably have to go home on Lokelma off Lasix.  Non-anion gap metabolic acidosis: Really due to chronic renal disease. Continue oral bicarbonate tablets acidosis is improving.  Anemia of chronic renal disease: Ferritin of 55, likely due to chronic renal disease.  Continue to check hemoglobin intermittently CBC is pending this morning. Hemoglobin has remained relatively stable.  Essential hypertension: Continue Coreg, Norvasc and hold Lasix.  Chronic diastolic heart failure: Resume diuretics continue strict I's and O's.   DVT prophylaxis: lovenox Family Communication:none Status is: Inpatient Remains inpatient appropriate because: Community-acquired pneumonia hypokalemia    Code Status:     Code Status Orders  (From  admission, onward)           Start     Ordered   02/21/22 2232  Full code  Continuous        02/21/22 2231           Code Status History     Date Active Date Inactive Code Status Order ID Comments User Context   05/31/2019 1110 06/16/2019 2317 Full Code [id number]  [place] Eric J, DO Inpatient         IV Access:   Peripheral IV   Procedures and diagnostic studies:   No results found.   Medical Consultants:   None.   Subjective:    Nathan Tyler is he feels great no complaints this morning.     Objective:    Vitals:   02/25/22 0000 02/25/22 0329 02/25/22 0400 02/25/22 0800  BP: (!) 154/66  (!) 151/64 (!) 170/82  Pulse: (!) 59 (!) 58 (!) 58 65  Resp: '16 16 17 18  '$ Temp: 98.1 F (36.7 C)  98.5 F (36.9 C) 97.7 F (36.5 C)  TempSrc: Oral  Oral   SpO2: 98% 97% 97% 92%  Weight:      Height:       SpO2: 92 % O2 Flow Rate (L/min): 2 L/min FiO2 (%): 30 %   Intake/Output Summary (Last 24 hours) at 02/25/2022 0911 Last data filed at 02/24/2022 1600 Gross per 24 hour  Intake 600 ml  Output --  Net 600 ml    Filed Weights   02/22/22 0848  Weight: 104.3 kg    Exam: General exam: In no acute distress. Respiratory system: Good air movement and clear to auscultation. Cardiovascular system: S1 & S2 heard, RRR. No JVD. Gastrointestinal  system: Abdomen is nondistended, soft and nontender.  Extremities: No pedal edema. Skin: No rashes, lesions or ulcers Psychiatry: Judgement and insight appear normal. Mood & affect appropriate.   Data Reviewed:    Labs: Basic Metabolic Panel: Recent Labs  Lab 02/21/22 2048 02/21/22 2119 02/22/22 0209 02/23/22 0219 02/24/22 1016 02/25/22 0316  NA 147* 145 143 144 143 140  K 5.6* 5.1 5.8* 5.6* 5.1 5.3*  CL 121*  --  120* 122* 118* 114*  CO2 16*  --  16* 16* 16* 19*  GLUCOSE 92  --  94 130* 155* 183*  BUN 88*  --  84* 87* 71* 75*  CREATININE 4.38*  --  4.30* 4.25* 3.69* 3.91*  CALCIUM 8.1*  --  8.1* 7.7*  8.1* 7.8*  MG 2.3  --  2.3  --   --   --   PHOS  --   --  6.3*  --   --   --     GFR Estimated Creatinine Clearance: 22.6 mL/min (A) (by C-G formula based on SCr of 3.91 mg/dL (H)). Liver Function Tests: Recent Labs  Lab 02/21/22 2048 02/22/22 0209  AST 16 16  ALT 15 16  ALKPHOS 72 74  BILITOT 0.4 0.3  PROT 5.4* 5.5*  ALBUMIN 3.3* 3.3*    No results for input(s): "LIPASE", "AMYLASE" in the last 168 hours. No results for input(s): "AMMONIA" in the last 168 hours. Coagulation profile Recent Labs  Lab 02/22/22 0829  INR 1.1    COVID-19 Labs  No results for input(s): "DDIMER", "FERRITIN", "LDH", "CRP" in the last 72 hours.   Lab Results  Component Value Date   SARSCOV2NAA NEGATIVE 02/21/2022   [place] NEGATIVE 01/15/2019    CBC: Recent Labs  Lab 02/21/22 2048 02/21/22 2119 02/22/22 0209 02/23/22 1122  WBC 7.9  --  8.1 8.5  NEUTROABS 4.5  --  6.6 4.7  HGB 7.8* 6.8* 7.5* 7.5*  HCT 26.4* 20.0* 25.5* 25.2*  MCV 98.5  --  97.7 96.9  PLT 274  --  253 275    Cardiac Enzymes: No results for input(s): "CKTOTAL", "CKMB", "CKMBINDEX", "TROPONINI" in the last 168 hours. BNP (last 3 results) No results for input(s): "PROBNP" in the last 8760 hours. CBG: Recent Labs  Lab 02/24/22 1219 02/24/22 1810 02/24/22 1819 02/24/22 2146 02/25/22 0757  GLUCAP 197* 187* 184* 206* 152*    D-Dimer: No results for input(s): "DDIMER" in the last 72 hours. Hgb A1c: Recent Labs    02/22/22 1823  HGBA1C 5.7*    Lipid Profile: No results for input(s): "CHOL", "HDL", "LDLCALC", "TRIG", "CHOLHDL", "LDLDIRECT" in the last 72 hours. Thyroid function studies: No results for input(s): "TSH", "T4TOTAL", "T3FREE", "THYROIDAB" in the last 72 hours.  Invalid input(s): "FREET3"  Anemia work up: No results for input(s): "VITAMINB12", "FOLATE", "FERRITIN", "TIBC", "IRON", "RETICCTPCT" in the last 72 hours.  Sepsis Labs: Recent Labs  Lab 02/21/22 2048 02/21/22 2226  02/22/22 0209 02/23/22 1122  PROCALCITON  --   --  0.15  --   WBC 7.9  --  8.1 8.5  LATICACIDVEN  --  0.5  --   --     Microbiology Recent Results (from the past 240 hour(s))  Resp Panel by RT-PCR (Flu A&B, Covid) Anterior Nasal Swab     Status: None   Collection Time: 02/21/22  8:33 PM   Specimen: Anterior Nasal Swab  Result Value Ref Range Status   SARS Coronavirus 2 by RT PCR NEGATIVE NEGATIVE Final  Comment: (NOTE) SARS-CoV-2 target nucleic acids are NOT DETECTED.  The SARS-CoV-2 RNA is generally detectable in upper respiratory specimens during the acute phase of infection. The lowest concentration of SARS-CoV-2 viral copies this assay can detect is 138 copies/mL. A negative result does not preclude SARS-Cov-2 infection and should not be used as the sole basis for treatment or other patient management decisions. A negative result may occur with  improper specimen collection/handling, submission of specimen other than nasopharyngeal swab, presence of viral mutation(s) within the areas targeted by this assay, and inadequate number of viral copies(<138 copies/mL). A negative result must be combined with clinical observations, patient history, and epidemiological information. The expected result is Negative.  Fact Sheet for Patients:  EntrepreneurPulse.com.au  Fact Sheet for Healthcare Providers:  IncredibleEmployment.be  This test is no t yet approved or cleared by the [place] FDA and  has been authorized for detection and/or diagnosis of SARS-CoV-2 by FDA under an Emergency Use Authorization (EUA). This EUA will remain  in effect (meaning this test can be used) for the duration of the COVID-19 declaration under Section 564(b)(1) of the Act, 21 U.S.C.section 360bbb-3(b)(1), unless the authorization is terminated  or revoked sooner.       Influenza A by PCR NEGATIVE NEGATIVE Final   Influenza B by PCR NEGATIVE NEGATIVE Final     Comment: (NOTE) The Xpert Xpress SARS-CoV-2/FLU/RSV plus assay is intended as an aid in the diagnosis of influenza from Nasopharyngeal swab specimens and should not be used as a sole basis for treatment. Nasal washings and aspirates are unacceptable for Xpert Xpress SARS-CoV-2/FLU/RSV testing.  Fact Sheet for Patients: EntrepreneurPulse.com.au  Fact Sheet for Healthcare Providers: IncredibleEmployment.be  This test is not yet approved or cleared by the [place] FDA and has been authorized for detection and/or diagnosis of SARS-CoV-2 by FDA under an Emergency Use Authorization (EUA). This EUA will remain in effect (meaning this test can be used) for the duration of the COVID-19 declaration under Section 564(b)(1) of the Act, 21 U.S.C. section 360bbb-3(b)(1), unless the authorization is terminated or revoked.  Performed at [facility], [address], [place], [place] [address]   Blood culture (routine x 2)     Status: None (Preliminary result)   Collection Time: 02/21/22 10:26 PM   Specimen: BLOOD RIGHT FOREARM  Result Value Ref Range Status   Specimen Description BLOOD RIGHT FOREARM  Final   Special Requests   Final    BOTTLES DRAWN AEROBIC AND ANAEROBIC Blood Culture results may not be optimal due to an excessive volume of blood received in culture bottles   Culture   Final    NO GROWTH 4 DAYS Performed at [facility], [place] [address] [place], [place] [address]    Report Status PENDING  Incomplete  Blood culture (routine x 2)     Status: None (Preliminary result)   Collection Time: 02/21/22 10:36 PM   Specimen: BLOOD LEFT FOREARM  Result Value Ref Range Status   Specimen Description BLOOD LEFT FOREARM  Final   Special Requests   Final    BOTTLES DRAWN AEROBIC AND ANAEROBIC Blood Culture adequate volume   Culture   Final    NO GROWTH 4 DAYS Performed at [facility], [place] [address], [place],   [address]    Report Status PENDING  Incomplete     Medications:    amLODipine  10 mg Oral Daily   atorvastatin  10 mg Oral Daily  carvedilol  12.5 mg Oral BID   cefdinir  300 mg Oral Q12H   insulin aspart  0-6 Units Subcutaneous TID WC   levothyroxine  112 mcg Oral Daily   mycophenolate  180 mg Oral BID   sodium bicarbonate  1,300 mg Oral TID   sodium polystyrene  30 g Oral BID   sodium zirconium cyclosilicate  5 g Oral BID   tacrolimus  1.5 mg Oral BID   Continuous Infusions:      LOS: 4 days   Charlynne Cousins  [facility]  02/25/2022, 9:11 AM

## 2022-02-25 NOTE — Progress Notes (Signed)
Pt refused night time Bipap use. SpO2 being maintained at 100% with nasal cannula at this time. Will continue to monitor.

## 2022-02-25 NOTE — Plan of Care (Signed)

## 2022-02-25 NOTE — Plan of Care (Signed)
  Problem: Education: Goal: Ability to describe self-care measures that may prevent or decrease complications (Diabetes Survival Skills Education) will improve Outcome: Progressing Goal: Individualized Educational Video(s) Outcome: Progressing   

## 2022-02-26 DIAGNOSIS — J189 Pneumonia, unspecified organism: Secondary | ICD-10-CM | POA: Diagnosis not present

## 2022-02-26 DIAGNOSIS — E875 Hyperkalemia: Secondary | ICD-10-CM | POA: Diagnosis not present

## 2022-02-26 LAB — BASIC METABOLIC PANEL
Anion gap: 6 (ref 5–15)
BUN: 63 mg/dL — ABNORMAL HIGH (ref 8–23)
CO2: 20 mmol/L — ABNORMAL LOW (ref 22–32)
Calcium: 7.9 mg/dL — ABNORMAL LOW (ref 8.9–10.3)
Chloride: 115 mmol/L — ABNORMAL HIGH (ref 98–111)
Creatinine, Ser: 3.46 mg/dL — ABNORMAL HIGH (ref 0.61–1.24)
GFR, Estimated: 18 mL/min — ABNORMAL LOW (ref >60)
Glucose, Bld: 145 mg/dL — ABNORMAL HIGH (ref 70–99)
Potassium: 4.7 mmol/L (ref 3.5–5.1)
Sodium: 141 mmol/L (ref 135–145)

## 2022-02-26 LAB — CULTURE, BLOOD (ROUTINE X 2)
Culture: NO GROWTH
Culture: NO GROWTH
Special Requests: ADEQUATE

## 2022-02-26 LAB — GLUCOSE, CAPILLARY
Glucose-Capillary: 168 mg/dL — ABNORMAL HIGH (ref 70–99)
Glucose-Capillary: 142 mg/dL — ABNORMAL HIGH (ref 70–99)
Glucose-Capillary: 164 mg/dL — ABNORMAL HIGH (ref 70–99)
Glucose-Capillary: 183 mg/dL — ABNORMAL HIGH (ref 70–99)

## 2022-02-26 MED ORDER — SODIUM ZIRCONIUM CYCLOSILICATE 10 G PO PACK
10.0000 g | PACK | Freq: Every day | ORAL | Status: DC
  Administered 2022-02-26 – 2022-02-27 (×2): 10 g via ORAL
  Filled 2022-02-26: qty 1

## 2022-02-26 MED ORDER — FUROSEMIDE 20 MG PO TABS
20.0000 mg | ORAL_TABLET | Freq: Every day | ORAL | Status: DC
  Administered 2022-02-26 – 2022-02-27 (×2): 20 mg via ORAL
  Filled 2022-02-26 (×2): qty 1

## 2022-02-26 NOTE — Plan of Care (Signed)
  Problem: Education: Goal: Ability to describe self-care measures that may prevent or decrease complications (Diabetes Survival Skills Education) will improve Outcome: Progressing Goal: Individualized Educational Video(s) Outcome: Progressing   

## 2022-02-26 NOTE — Care Management Important Message (Signed)
Important Message  Patient Details  Name: Nathan Tyler MRN: [id number] Date of Birth: July 04, 1955   Medicare Important Message Given:  Yes     Orbie Pyo 02/26/2022, 4:24 PM

## 2022-02-26 NOTE — Progress Notes (Signed)
Occupational Therapy Treatment Patient Details Name: Nathan Tyler MRN: [id number] DOB: [birth year] Today's Date: 02/26/2022   History of present illness 66 y.o. male  past medical history significant for renal transplant chronic renal disease stage IV, diabetes mellitus type 2, essential hypertension admitted to [facility] 02/21/22 for shortness of breath subjective fever and cough was found to have community-acquired pneumonia.   OT comments  Pt in bed upon therapy arrival agreeable to participate in OT treatment. OT provided patient education on energy conservation techniques and discussed the 4 P's. Reviewed ADL tasks and patient's role at home while discussing strategies to use to conserve energy. Pt verbalized understanding of all education. Handout provided and left with patient. Pt reports plan to discharge home tomorrow. OT will continue to follow patient acutely.     Recommendations for follow up therapy are one component of a multi-disciplinary discharge planning process, led by the attending physician.  Recommendations may be updated based on patient status, additional functional criteria and insurance authorization.    Follow Up Recommendations  Home health OT    Assistance Recommended at Discharge PRN  Patient can return home with the following  A little help with bathing/dressing/bathroom;Assistance with cooking/housework;Assist for transportation;A little help with walking and/or transfers   Equipment Recommendations  None recommended by OT       Precautions / Restrictions Precautions Precautions: None Restrictions Weight Bearing Restrictions: No              ADL either performed or assessed with clinical judgement    Extremity/Trunk Assessment Upper Extremity Assessment Upper Extremity Assessment: Defer to OT evaluation   Lower Extremity Assessment Lower Extremity Assessment: Defer to PT evaluation        Vision Baseline Vision/History: 1 Wears glasses Patient  Visual Report: No change from baseline            Cognition Arousal/Alertness: Awake/alert Behavior During Therapy: WFL for tasks assessed/performed Overall Cognitive Status: Within Functional Limits for tasks assessed        Patient Education  Pt education: provided education regarding energy conservation techniques, 4 P's. Reviewed examples on strategies to use at home. Provided handout. Pt verbalized understanding.            Pertinent Vitals/ Pain       Pain Assessment Pain Assessment: No/denies pain         Frequency  Min 2X/week        Progress Toward Goals  OT Goals(current goals can now be found in the care plan section)  Progress towards OT goals: Progressing toward goals     Plan Discharge plan remains appropriate;Frequency remains appropriate       AM-PAC OT "6 Clicks" Daily Activity     Outcome Measure   Help from another person eating meals?: None Help from another person taking care of personal grooming?: A Little Help from another person toileting, which includes using toliet, bedpan, or urinal?: A Little Help from another person bathing (including washing, rinsing, drying)?: A Little Help from another person to put on and taking off regular upper body clothing?: None Help from another person to put on and taking off regular lower body clothing?: A Little 6 Click Score: 20    End of Session    OT Visit Diagnosis: Unsteadiness on feet (R26.81)   Activity Tolerance Patient tolerated treatment well   Patient Left in bed;with call bell/phone within reach           Time: 7253-6644 OT Time Calculation (min):  8 min  Charges: OT General Charges $OT Visit: 1 Visit OT Treatments $Therapeutic Activity: 8-22 mins  Ailene Ravel, OTR/L,CBIS  Supplemental OT - [facility] and [facility]   Ozzie Knobel, Clarene Duke 02/26/2022, 1:43 PM

## 2022-02-26 NOTE — Progress Notes (Signed)
[facility] PROGRESS NOTE    Progress Note  Nathan Tyler  LZJ:[id number] DOB: [birth year] DOA: 02/21/2022 PCP: Celene Squibb, MD     Brief Narrative:   Nathan Tyler is an 66 y.o. male  past medical history significant for renal transplant in 2010 chronically immunosuppressed, chronic renal disease stage IV with a baseline creatinine around 3-4, diabetes mellitus type 2, essential hypertension admitted to [facility] for shortness of breath subjective fever and cough was found to have community-acquired pneumonia.   Assessment/Plan:   CAP (community acquired pneumonia): Has remained afebrile leukocytosis.  He has been weaned to room air. Culture data has remained negative. Completed antibiotic treatment in house. PT OT evaluated the patient, he will need to go home with home health.  Chronic kidney disease stage IV: Acute kidney injury has been ruled out. Creatinine appears to be at baseline. He was started on IV fluid hydration.  Now KVO. Resume Lasix.  Hyperkalemia: Potassium is improved today. He was given Kayexalate, now improved. And will start him on oral Lokelma daily recheck basic metabolic panel tomorrow morning. Continue oral bicarbonate tablets. Resume Lasix check a basic metabolic panel tomorrow. We will have to check in a week.  Non-anion gap metabolic acidosis: Likely due to chronic renal disease. Continue oral bicarbonate tablets acidosis is improving.  Anemia of chronic renal disease: Ferritin of 55, likely due to chronic renal disease.  Continue to check hemoglobin intermittently CBC is pending this morning. Hemoglobin has remained relatively stable.  Essential hypertension: Continue Coreg, Norvasc and resume Lasix.  Chronic diastolic heart failure: Resume diuretics continue strict I's and O's.   DVT prophylaxis: lovenox Family Communication:none Status is: Inpatient Remains inpatient appropriate because: Community-acquired pneumonia  hypokalemia    Code Status:     Code Status Orders  (From admission, onward)           Start     Ordered   02/21/22 2232  Full code  Continuous        02/21/22 2231           Code Status History     Date Active Date Inactive Code Status Order ID Comments User Context   05/31/2019 1110 06/16/2019 2317 Full Code [id number]  [place] Eric J, DO Inpatient         IV Access:   Peripheral IV   Procedures and diagnostic studies:   No results found.   Medical Consultants:   None.   Subjective:    Nathan Tyler no new complaints today, he is in a really good mood  Objective:    Vitals:   02/25/22 2000 02/26/22 0000 02/26/22 0354 02/26/22 0822  BP: (!) 161/70 (!) 163/69 (!) 175/68 (!) 160/70  Pulse: 62 64 64 68  Resp: '18 20 14 19  '$ Temp: 97.9 F (36.6 C) 97.9 F (36.6 C) 97.9 F (36.6 C) 98.8 F (37.1 C)  TempSrc: Oral Oral Oral Oral  SpO2: 97% 99% 94% 91%  Weight:      Height:       SpO2: 91 % O2 Flow Rate (L/min): 2 L/min FiO2 (%): 30 %   Intake/Output Summary (Last 24 hours) at 02/26/2022 1015 Last data filed at 02/25/2022 2249 Gross per 24 hour  Intake 390 ml  Output --  Net 390 ml    Filed Weights   02/22/22 0848  Weight: 104.3 kg    Exam: General exam: In no acute distress. Respiratory system: Good air movement and clear to auscultation. Cardiovascular  system: S1 & S2 heard, RRR. No JVD. Gastrointestinal system: Abdomen is nondistended, soft and nontender.  Extremities: No pedal edema. Skin: No rashes, lesions or ulcers Psychiatry: Judgement and insight appear normal. Mood & affect appropriate.   Data Reviewed:    Labs: Basic Metabolic Panel: Recent Labs  Lab 02/21/22 2048 02/21/22 2119 02/22/22 0209 02/23/22 0219 02/24/22 1016 02/25/22 0316 02/26/22 0426  NA 147*   < > 143 144 143 140 141  K 5.6*   < > 5.8* 5.6* 5.1 5.3* 4.7  CL 121*  --  120* 122* 118* 114* 115*  CO2 16*  --  16* 16* 16* 19* 20*  GLUCOSE 92  --   94 130* 155* 183* 145*  BUN 88*  --  84* 87* 71* 75* 63*  CREATININE 4.38*  --  4.30* 4.25* 3.69* 3.91* 3.46*  CALCIUM 8.1*  --  8.1* 7.7* 8.1* 7.8* 7.9*  MG 2.3  --  2.3  --   --   --   --   PHOS  --   --  6.3*  --   --   --   --    < > = values in this interval not displayed.    GFR Estimated Creatinine Clearance: 25.5 mL/min (A) (by C-G formula based on SCr of 3.46 mg/dL (H)). Liver Function Tests: Recent Labs  Lab 02/21/22 2048 02/22/22 0209  AST 16 16  ALT 15 16  ALKPHOS 72 74  BILITOT 0.4 0.3  PROT 5.4* 5.5*  ALBUMIN 3.3* 3.3*    No results for input(s): "LIPASE", "AMYLASE" in the last 168 hours. No results for input(s): "AMMONIA" in the last 168 hours. Coagulation profile Recent Labs  Lab 02/22/22 0829  INR 1.1    COVID-19 Labs  No results for input(s): "DDIMER", "FERRITIN", "LDH", "CRP" in the last 72 hours.   Lab Results  Component Value Date   SARSCOV2NAA NEGATIVE 02/21/2022   [place] NEGATIVE 01/15/2019    CBC: Recent Labs  Lab 02/21/22 2048 02/21/22 2119 02/22/22 0209 02/23/22 1122  WBC 7.9  --  8.1 8.5  NEUTROABS 4.5  --  6.6 4.7  HGB 7.8* 6.8* 7.5* 7.5*  HCT 26.4* 20.0* 25.5* 25.2*  MCV 98.5  --  97.7 96.9  PLT 274  --  253 275    Cardiac Enzymes: No results for input(s): "CKTOTAL", "CKMB", "CKMBINDEX", "TROPONINI" in the last 168 hours. BNP (last 3 results) No results for input(s): "PROBNP" in the last 8760 hours. CBG: Recent Labs  Lab 02/25/22 0757 02/25/22 1134 02/25/22 1532 02/25/22 2040 02/26/22 0955  GLUCAP 152* 194* 163* 138* 168*    D-Dimer: No results for input(s): "DDIMER" in the last 72 hours. Hgb A1c: No results for input(s): "HGBA1C" in the last 72 hours.  Lipid Profile: No results for input(s): "CHOL", "HDL", "LDLCALC", "TRIG", "CHOLHDL", "LDLDIRECT" in the last 72 hours. Thyroid function studies: No results for input(s): "TSH", "T4TOTAL", "T3FREE", "THYROIDAB" in the last 72 hours.  Invalid input(s):  "FREET3"  Anemia work up: No results for input(s): "VITAMINB12", "FOLATE", "FERRITIN", "TIBC", "IRON", "RETICCTPCT" in the last 72 hours.  Sepsis Labs: Recent Labs  Lab 02/21/22 2048 02/21/22 2226 02/22/22 0209 02/23/22 1122  PROCALCITON  --   --  0.15  --   WBC 7.9  --  8.1 8.5  LATICACIDVEN  --  0.5  --   --     Microbiology Recent Results (from the past 240 hour(s))  Resp Panel by RT-PCR (Flu A&B, Covid) Anterior  Nasal Swab     Status: None   Collection Time: 02/21/22  8:33 PM   Specimen: Anterior Nasal Swab  Result Value Ref Range Status   SARS Coronavirus 2 by RT PCR NEGATIVE NEGATIVE Final    Comment: (NOTE) SARS-CoV-2 target nucleic acids are NOT DETECTED.  The SARS-CoV-2 RNA is generally detectable in upper respiratory specimens during the acute phase of infection. The lowest concentration of SARS-CoV-2 viral copies this assay can detect is 138 copies/mL. A negative result does not preclude SARS-Cov-2 infection and should not be used as the sole basis for treatment or other patient management decisions. A negative result may occur with  improper specimen collection/handling, submission of specimen other than nasopharyngeal swab, presence of viral mutation(s) within the areas targeted by this assay, and inadequate number of viral copies(<138 copies/mL). A negative result must be combined with clinical observations, patient history, and epidemiological information. The expected result is Negative.  Fact Sheet for Patients:  EntrepreneurPulse.com.au  Fact Sheet for Healthcare Providers:  IncredibleEmployment.be  This test is no t yet approved or cleared by the [place] FDA and  has been authorized for detection and/or diagnosis of SARS-CoV-2 by FDA under an Emergency Use Authorization (EUA). This EUA will remain  in effect (meaning this test can be used) for the duration of the COVID-19 declaration under Section 564(b)(1)  of the Act, 21 U.S.C.section 360bbb-3(b)(1), unless the authorization is terminated  or revoked sooner.       Influenza A by PCR NEGATIVE NEGATIVE Final   Influenza B by PCR NEGATIVE NEGATIVE Final    Comment: (NOTE) The Xpert Xpress SARS-CoV-2/FLU/RSV plus assay is intended as an aid in the diagnosis of influenza from Nasopharyngeal swab specimens and should not be used as a sole basis for treatment. Nasal washings and aspirates are unacceptable for Xpert Xpress SARS-CoV-2/FLU/RSV testing.  Fact Sheet for Patients: EntrepreneurPulse.com.au  Fact Sheet for Healthcare Providers: IncredibleEmployment.be  This test is not yet approved or cleared by the [place] FDA and has been authorized for detection and/or diagnosis of SARS-CoV-2 by FDA under an Emergency Use Authorization (EUA). This EUA will remain in effect (meaning this test can be used) for the duration of the COVID-19 declaration under Section 564(b)(1) of the Act, 21 U.S.C. section 360bbb-3(b)(1), unless the authorization is terminated or revoked.  Performed at [facility], [place] [address], [place], [place] [address]   Blood culture (routine x 2)     Status: None   Collection Time: 02/21/22 10:26 PM   Specimen: BLOOD RIGHT FOREARM  Result Value Ref Range Status   Specimen Description BLOOD RIGHT FOREARM  Final   Special Requests   Final    BOTTLES DRAWN AEROBIC AND ANAEROBIC Blood Culture results may not be optimal due to an excessive volume of blood received in culture bottles   Culture   Final    NO GROWTH 5 DAYS Performed at [facility], [place] [address], [place], [place] [address]    Report Status 02/26/2022 FINAL  Final  Blood culture (routine x 2)     Status: None   Collection Time: 02/21/22 10:36 PM   Specimen: BLOOD LEFT FOREARM  Result Value Ref Range Status   Specimen Description BLOOD LEFT FOREARM  Final   Special Requests   Final    BOTTLES DRAWN  AEROBIC AND ANAEROBIC Blood Culture adequate volume   Culture   Final    NO GROWTH 5 DAYS Performed at [facility],  [address] [place], [place] [address]    Report Status 02/26/2022 FINAL  Final     Medications:    amLODipine  10 mg Oral Daily   atorvastatin  10 mg Oral Daily   carvedilol  12.5 mg Oral BID   cefdinir  300 mg Oral Q12H   insulin aspart  0-6 Units Subcutaneous TID WC   levothyroxine  112 mcg Oral Daily   mycophenolate  180 mg Oral BID   sodium bicarbonate  1,300 mg Oral TID   sodium zirconium cyclosilicate  10 g Oral BID   tacrolimus  1.5 mg Oral BID   Continuous Infusions:      LOS: 5 days   Charlynne Cousins  [facility]  02/26/2022, 10:15 AM

## 2022-02-26 NOTE — TOC Initial Note (Signed)
Transition of Care [facility]) - Initial/Assessment Note    Patient Details  Name: Nathan Tyler MRN: [id number] Date of Birth: 1955-11-23  [facility] of [facility]) CM/SW Contact:    Cyndi Bender, RN Phone Number: 02/26/2022, 12:19 PM  Clinical Narrative:                 Spoke to patient at bedside regarding transition needs. Patient has used [facility] in the past and is agreeable to use them again. Tommi Rumps with [facility] accepted referral. Patient has a caregiver [phone] daily.  Patient has a walker at home if needed.  Wife can pick up patient at discharge.   Address, Phone number and PCP verified.  Expected Discharge Plan: [place] Barriers to Discharge: Continued Medical Work up   Patient Goals and CMS Choice Patient states their goals for this hospitalization and ongoing recovery are:: return home CMS Medicare.gov Compare Post Acute Care list provided to:: Patient Choice offered to / list presented to : Patient  Expected Discharge Plan and Services Expected Discharge Plan: [place]   Discharge Planning Services: CM Consult Post Acute Care Choice: [place] arrangements for the past 2 months: Single Family Home                           HH Arranged: PT, OT HH Agency: [place] Date [facility] Agency Contacted: 02/26/22 Time [place]: 1218 Representative spoke with at [place]: Tommi Rumps  Prior Living Arrangements/Services Living arrangements for the past 2 months: [place] Lives with:: Spouse Patient language and need for interpreter reviewed:: Yes Do you feel safe going back to the place where you live?: Yes      Need for Family Participation in Patient Care: Yes (Comment) Care giver support system in place?: Yes (comment)   Criminal Activity/Legal Involvement Pertinent to Current Situation/Hospitalization: No - Comment as needed  Activities of Daily Living      Permission  Sought/Granted Permission sought to share information with : Investment banker, corporate granted to share info w AGENCY: HH        Emotional Assessment Appearance:: Appears stated age Attitude/Demeanor/Rapport: Engaged Affect (typically observed): Accepting Orientation: : Oriented to Self, Oriented to Place, Oriented to  Time, Oriented to Situation Alcohol / Substance Use: Not Applicable Psych Involvement: No (comment)  Admission diagnosis:  Hyperkalemia [E87.5] Hypoglycemia [E16.2] CAP (community acquired pneumonia) [J18.9] Acute respiratory failure with hypoxia ([place]) [J96.01] AKI (acute kidney injury) ([place]) [N17.9] Low hemoglobin [D64.9] Pneumonia of both lower lobes due to infectious organism [J18.9] Patient Active Problem List   Diagnosis Date Noted   Acute renal failure superimposed on stage 4 chronic kidney disease ([place]) 02/22/2022   Acute on chronic anemia 35/57/3220   Acute metabolic encephalopathy 25/42/7062   Hyperkalemia 02/22/2022   DM2 (diabetes mellitus, type 2) ([place]) 02/22/2022   Chronic diastolic CHF (congestive heart failure) ([place]) 02/22/2022   CAP (community acquired pneumonia) 02/21/2022   Change in bowel habits 11/15/2021   Overweight (BMI 25.0-29.9) 06/03/2019   Acute respiratory failure with hypoxia ([place]) 06/03/2019   Hypothyroidism    COVID-19 virus infection 05/31/2019   Essential hypertension 05/31/2019   CKD (chronic kidney disease) 05/31/2019   Renal transplant, status post 05/31/2019   HLD (hyperlipidemia) 05/31/2019   H/O insulin dependent diabetes mellitus 05/31/2019   Chorea 05/31/2019   Diarrhea 09/02/2018   History of  colonic polyps 10/25/2017   HCV infection 10/25/2017   Hepatitis C antibody positive in blood 03/12/2017   Elevated LFTs 03/12/2017   PCP:  Celene Squibb, MD Pharmacy[facility]   [id number] [id number] [id number]: [phone]: [phone]    Social Determinants of Health (SDOH) Interventions    Readmission Risk Interventions     No data to display

## 2022-02-27 DIAGNOSIS — E875 Hyperkalemia: Secondary | ICD-10-CM | POA: Diagnosis not present

## 2022-02-27 DIAGNOSIS — J189 Pneumonia, unspecified organism: Secondary | ICD-10-CM | POA: Diagnosis not present

## 2022-02-27 LAB — BASIC METABOLIC PANEL
Anion gap: 6 (ref 5–15)
BUN: 57 mg/dL — ABNORMAL HIGH (ref 8–23)
CO2: 20 mmol/L — ABNORMAL LOW (ref 22–32)
Calcium: 8 mg/dL — ABNORMAL LOW (ref 8.9–10.3)
Chloride: 114 mmol/L — ABNORMAL HIGH (ref 98–111)
Creatinine, Ser: 3.32 mg/dL — ABNORMAL HIGH (ref 0.61–1.24)
GFR, Estimated: 19 mL/min — ABNORMAL LOW (ref >60)
Glucose, Bld: 154 mg/dL — ABNORMAL HIGH (ref 70–99)
Potassium: 4.4 mmol/L (ref 3.5–5.1)
Sodium: 140 mmol/L (ref 135–145)

## 2022-02-27 LAB — GLUCOSE, CAPILLARY
Glucose-Capillary: 151 mg/dL — ABNORMAL HIGH (ref 70–99)
Glucose-Capillary: 145 mg/dL — ABNORMAL HIGH (ref 70–99)
Glucose-Capillary: 172 mg/dL — ABNORMAL HIGH (ref 70–99)
Glucose-Capillary: 174 mg/dL — ABNORMAL HIGH (ref 70–99)

## 2022-02-27 MED ORDER — FUROSEMIDE 40 MG PO TABS
40.0000 mg | ORAL_TABLET | Freq: Every day | ORAL | Status: DC
  Administered 2022-02-28: 40 mg via ORAL
  Filled 2022-02-27: qty 1

## 2022-02-27 MED ORDER — MYCOPHENOLATE SODIUM 180 MG PO TBEC: 360.0000 mg | DELAYED_RELEASE_TABLET | Freq: Every day | ORAL | Status: DC

## 2022-02-27 MED ORDER — HYDRALAZINE HCL 50 MG PO TABS
50.0000 mg | ORAL_TABLET | Freq: Three times a day (TID) | ORAL | Status: DC
  Administered 2022-02-27 – 2022-02-28 (×3): 50 mg via ORAL
  Filled 2022-02-27 (×3): qty 1

## 2022-02-27 NOTE — Progress Notes (Signed)
[facility] PROGRESS NOTE    Progress Note  Nathan Tyler  YBW:[id number] DOB: 03-Jul-1955 DOA: 02/21/2022 PCP: Celene Squibb, MD     Brief Narrative:   Nathan Tyler is an 66 y.o. male  past medical history significant for renal transplant in 2010 chronically immunosuppressed, chronic renal disease stage IV with a baseline creatinine around 3-4, diabetes mellitus type 2, essential hypertension admitted to [facility] for shortness of breath subjective fever and cough was found to have community-acquired pneumonia.   Assessment/Plan:   CAP (community acquired pneumonia): Has remained afebrile leukocytosis.  He has been weaned to room air. Culture data has remained negative. Completed antibiotic treatment in house. PT OT evaluated the patient, he will need to go home with home health.  Chronic kidney disease stage IV: Acute kidney injury has been ruled out. Creatinine appears to be at baseline. Continue Lasix at higher dose. Basic metabolic panel in the morning.  Hyperkalemia: He was given Kayexalate, now improved. Started on oral Lokelma daily basic metabolic panels pending. Continue oral bicarbonate tablets. If potassium is stable can probably be discharged in am  Non-anion gap metabolic acidosis: Likely due to chronic renal disease. Continue oral bicarbonate tablets acidosis is improving.  Anemia of chronic renal disease: Ferritin of 55, likely due to chronic renal disease.  Continue to check hemoglobin intermittently CBC is pending this morning. Hemoglobin has remained relatively stable.  Essential hypertension: Continue Coreg, Norvasc and continue Lasix. Increase hydralazine  Chronic diastolic heart failure: Resume all heart failure medications.   DVT prophylaxis: lovenox Family Communication:none Status is: Inpatient Remains inpatient appropriate because: Community-acquired pneumonia hypokalemia    Code Status:     Code Status Orders  (From admission,  onward)           Start     Ordered   02/21/22 2232  Full code  Continuous        02/21/22 2231           Code Status History     Date Active Date Inactive Code Status Order ID Comments User Context   05/31/2019 1110 06/16/2019 2317 Full Code [id number]  [place] Eric J, DO Inpatient         IV Access:   Peripheral IV   Procedures and diagnostic studies:   No results found.   Medical Consultants:   None.   Subjective:    Nathan Tyler is a good mood this morning no new complaints.  Objective:    Vitals:   02/27/22 0000 02/27/22 0347 02/27/22 0749 02/27/22 0822  BP: (!) 145/77 (!) 156/72 (!) 156/57 (!) 172/71  Pulse: 71 (!) 58 62 65  Resp: '19 19 20   '$ Temp:  98.4 F (36.9 C) 97.6 F (36.4 C)   TempSrc:  Axillary Oral   SpO2: 97% 97% 98%   Weight:      Height:       SpO2: 98 % O2 Flow Rate (L/min): 2 L/min FiO2 (%): 30 %   Intake/Output Summary (Last 24 hours) at 02/27/2022 0845 Last data filed at 02/27/2022 0600 Gross per 24 hour  Intake 360 ml  Output --  Net 360 ml    Filed Weights   02/22/22 0848  Weight: 104.3 kg    Exam: General exam: In no acute distress. Respiratory system: Good air movement and clear to auscultation. Cardiovascular system: S1 & S2 heard, RRR. No JVD. Gastrointestinal system: Abdomen is nondistended, soft and nontender.  Extremities: No pedal edema. Skin: No  rashes, lesions or ulcers Psychiatry: Judgement and insight appear normal. Mood & affect appropriate.  Data Reviewed:    Labs: Basic Metabolic Panel: Recent Labs  Lab 02/21/22 2048 02/21/22 2119 02/22/22 0209 02/23/22 0219 02/24/22 1016 02/25/22 0316 02/26/22 0426  NA 147*   < > 143 144 143 140 141  K 5.6*   < > 5.8* 5.6* 5.1 5.3* 4.7  CL 121*  --  120* 122* 118* 114* 115*  CO2 16*  --  16* 16* 16* 19* 20*  GLUCOSE 92  --  94 130* 155* 183* 145*  BUN 88*  --  84* 87* 71* 75* 63*  CREATININE 4.38*  --  4.30* 4.25* 3.69* 3.91* 3.46*  CALCIUM  8.1*  --  8.1* 7.7* 8.1* 7.8* 7.9*  MG 2.3  --  2.3  --   --   --   --   PHOS  --   --  6.3*  --   --   --   --    < > = values in this interval not displayed.    GFR Estimated Creatinine Clearance: 25.5 mL/min (A) (by C-G formula based on SCr of 3.46 mg/dL (H)). Liver Function Tests: Recent Labs  Lab 02/21/22 2048 02/22/22 0209  AST 16 16  ALT 15 16  ALKPHOS 72 74  BILITOT 0.4 0.3  PROT 5.4* 5.5*  ALBUMIN 3.3* 3.3*    No results for input(s): "LIPASE", "AMYLASE" in the last 168 hours. No results for input(s): "AMMONIA" in the last 168 hours. Coagulation profile Recent Labs  Lab 02/22/22 0829  INR 1.1    COVID-19 Labs  No results for input(s): "DDIMER", "FERRITIN", "LDH", "CRP" in the last 72 hours.   Lab Results  Component Value Date   SARSCOV2NAA NEGATIVE 02/21/2022   [place] NEGATIVE 01/15/2019    CBC: Recent Labs  Lab 02/21/22 2048 02/21/22 2119 02/22/22 0209 02/23/22 1122  WBC 7.9  --  8.1 8.5  NEUTROABS 4.5  --  6.6 4.7  HGB 7.8* 6.8* 7.5* 7.5*  HCT 26.4* 20.0* 25.5* 25.2*  MCV 98.5  --  97.7 96.9  PLT 274  --  253 275    Cardiac Enzymes: No results for input(s): "CKTOTAL", "CKMB", "CKMBINDEX", "TROPONINI" in the last 168 hours. BNP (last 3 results) No results for input(s): "PROBNP" in the last 8760 hours. CBG: Recent Labs  Lab 02/26/22 0955 02/26/22 1242 02/26/22 1607 02/26/22 2112 02/27/22 0751  GLUCAP 168* 142* 164* 183* 151*    D-Dimer: No results for input(s): "DDIMER" in the last 72 hours. Hgb A1c: No results for input(s): "HGBA1C" in the last 72 hours.  Lipid Profile: No results for input(s): "CHOL", "HDL", "LDLCALC", "TRIG", "CHOLHDL", "LDLDIRECT" in the last 72 hours. Thyroid function studies: No results for input(s): "TSH", "T4TOTAL", "T3FREE", "THYROIDAB" in the last 72 hours.  Invalid input(s): "FREET3"  Anemia work up: No results for input(s): "VITAMINB12", "FOLATE", "FERRITIN", "TIBC", "IRON", "RETICCTPCT" in  the last 72 hours.  Sepsis Labs: Recent Labs  Lab 02/21/22 2048 02/21/22 2226 02/22/22 0209 02/23/22 1122  PROCALCITON  --   --  0.15  --   WBC 7.9  --  8.1 8.5  LATICACIDVEN  --  0.5  --   --     Microbiology Recent Results (from the past 240 hour(s))  Resp Panel by RT-PCR (Flu A&B, Covid) Anterior Nasal Swab     Status: None   Collection Time: 02/21/22  8:33 PM   Specimen: Anterior Nasal Swab  Result  Value Ref Range Status   SARS Coronavirus 2 by RT PCR NEGATIVE NEGATIVE Final    Comment: (NOTE) SARS-CoV-2 target nucleic acids are NOT DETECTED.  The SARS-CoV-2 RNA is generally detectable in upper respiratory specimens during the acute phase of infection. The lowest concentration of SARS-CoV-2 viral copies this assay can detect is 138 copies/mL. A negative result does not preclude SARS-Cov-2 infection and should not be used as the sole basis for treatment or other patient management decisions. A negative result may occur with  improper specimen collection/handling, submission of specimen other than nasopharyngeal swab, presence of viral mutation(s) within the areas targeted by this assay, and inadequate number of viral copies(<138 copies/mL). A negative result must be combined with clinical observations, patient history, and epidemiological information. The expected result is Negative.  Fact Sheet for Patients:  EntrepreneurPulse.com.au  Fact Sheet for Healthcare Providers:  IncredibleEmployment.be  This test is no t yet approved or cleared by the [place] FDA and  has been authorized for detection and/or diagnosis of SARS-CoV-2 by FDA under an Emergency Use Authorization (EUA). This EUA will remain  in effect (meaning this test can be used) for the duration of the COVID-19 declaration under Section 564(b)(1) of the Act, 21 U.S.C.section 360bbb-3(b)(1), unless the authorization is terminated  or revoked sooner.        Influenza A by PCR NEGATIVE NEGATIVE Final   Influenza B by PCR NEGATIVE NEGATIVE Final    Comment: (NOTE) The Xpert Xpress SARS-CoV-2/FLU/RSV plus assay is intended as an aid in the diagnosis of influenza from Nasopharyngeal swab specimens and should not be used as a sole basis for treatment. Nasal washings and aspirates are unacceptable for Xpert Xpress SARS-CoV-2/FLU/RSV testing.  Fact Sheet for Patients: EntrepreneurPulse.com.au  Fact Sheet for Healthcare Providers: IncredibleEmployment.be  This test is not yet approved or cleared by the [place] FDA and has been authorized for detection and/or diagnosis of SARS-CoV-2 by FDA under an Emergency Use Authorization (EUA). This EUA will remain in effect (meaning this test can be used) for the duration of the COVID-19 declaration under Section 564(b)(1) of the Act, 21 U.S.C. section 360bbb-3(b)(1), unless the authorization is terminated or revoked.  Performed at [facility], [place] [address], [place], [place] [address]   Blood culture (routine x 2)     Status: None   Collection Time: 02/21/22 10:26 PM   Specimen: BLOOD RIGHT FOREARM  Result Value Ref Range Status   Specimen Description BLOOD RIGHT FOREARM  Final   Special Requests   Final    BOTTLES DRAWN AEROBIC AND ANAEROBIC Blood Culture results may not be optimal due to an excessive volume of blood received in culture bottles   Culture   Final    NO GROWTH 5 DAYS Performed at [facility], [place] [address] [place], [place] [address]    Report Status 02/26/2022 FINAL  Final  Blood culture (routine x 2)     Status: None   Collection Time: 02/21/22 10:36 PM   Specimen: BLOOD LEFT FOREARM  Result Value Ref Range Status   Specimen Description BLOOD LEFT FOREARM  Final   Special Requests   Final    BOTTLES DRAWN AEROBIC AND ANAEROBIC Blood Culture adequate volume   Culture   Final    NO GROWTH 5 DAYS Performed at [facility], [place] [address], [place], [place] [address]    Report Status 02/26/2022 FINAL  Final     Medications:  amLODipine  10 mg Oral Daily   atorvastatin  10 mg Oral Daily   carvedilol  12.5 mg Oral BID   furosemide  20 mg Oral Daily   insulin aspart  0-6 Units Subcutaneous TID WC   levothyroxine  112 mcg Oral Daily   mycophenolate  180 mg Oral BID   sodium bicarbonate  1,300 mg Oral TID   sodium zirconium cyclosilicate  10 g Oral Daily   tacrolimus  1.5 mg Oral BID   Continuous Infusions:      LOS: 6 days   Charlynne Cousins  [facility]  02/27/2022, 8:45 AM

## 2022-02-27 NOTE — Plan of Care (Signed)

## 2022-02-27 NOTE — Progress Notes (Addendum)
Physical Therapy Treatment Patient Details Name: Nathan Tyler MRN: [id number] DOB: Mar 14, 1956 Today's Date: 02/27/2022   History of Present Illness 66 y.o. male  past medical history significant for renal transplant chronic renal disease stage IV, diabetes mellitus type 2, essential hypertension admitted to [facility] 02/21/22 for shortness of breath subjective fever and cough was found to have community-acquired pneumonia.    PT Comments    Patient making steady progress with mobility and ambulated ~500' with RW for support. Min guard/supervision provided for transfers. HR stable with intermittent PVC's, cardiac monitor indicated momentary HR max of 128 bpm. Rest break provided and HR returned to 90's-100's. Pt reported slight SOB at end of gait and recovered with 1 minute seated rest. EOS pt ambulated short bout in room with no AD, min guard for safety, pt using bed rail for support as he moved towards recliner. Continue to recommend HHPT when pt medically ready for discharge home. Will continue to progress as able.   Recommendations for follow up therapy are one component of a multi-disciplinary discharge planning process, led by the attending physician.  Recommendations may be updated based on patient status, additional functional criteria and insurance authorization.  Follow Up Recommendations  Home health PT     Assistance Recommended at Discharge Intermittent Supervision/Assistance  Patient can return home with the following A little help with walking and/or transfers;A little help with bathing/dressing/bathroom;Assistance with cooking/housework;Assist for transportation;Help with stairs or ramp for entrance   Equipment Recommendations  None recommended by PT    Recommendations for Other Services       Precautions / Restrictions Precautions Precautions: None Restrictions Weight Bearing Restrictions: No     Mobility  Bed Mobility Overal bed mobility: Needs Assistance Bed Mobility:  Supine to Sit     Supine to sit: Supervision, HOB elevated     General bed mobility comments: pt using extra effort and time, using bed rail.    Transfers Overall transfer level: Needs assistance Equipment used: Rolling walker (2 wheels) Transfers: Sit to/from Stand Sit to Stand: Supervision           General transfer comment: supervision for safety, pt impulsive and quick with stand but able to stabilize balance with RW.    Ambulation/Gait Ambulation/Gait assistance: Min guard, Min assist Gait Distance (Feet): 500 Feet Assistive device: Rolling walker (2 wheels), 1 person hand held assist Gait Pattern/deviations: Step-through pattern, Decreased stride length, Drifts right/left, Wide base of support, Decreased dorsiflexion - left Gait velocity: fair     General Gait Details: pt with slightly stiff Lt LE in slight extensor posture and drift Lt occasionally. no LOB noted throughout gait with RW. pt HR in 70-100's with gait with one instance of max HR of 128 bpm with quick return to 90-100's. pt SpO2 89% or greater during gait resting at 94% on RA at EOS.   Stairs             Wheelchair Mobility    Modified Rankin (Stroke Patients Only)       Balance Overall balance assessment: Needs assistance Sitting-balance support: No upper extremity supported, Feet supported Sitting balance-Leahy Scale: Good     Standing balance support: No upper extremity supported, During functional activity Standing balance-Leahy Scale: Fair                              Cognition Arousal/Alertness: Awake/alert Behavior During Therapy: WFL for tasks assessed/performed, Impulsive Overall Cognitive Status: Within  Functional Limits for tasks assessed                                          Exercises      General Comments        Pertinent Vitals/Pain Pain Assessment Pain Assessment: No/denies pain    Home Living                           Prior Function            PT Goals (current goals can now be found in the care plan section) Acute Rehab PT Goals Patient Stated Goal: Get well go home PT Goal Formulation: With patient Time For Goal Achievement: 03/10/22 Potential to Achieve Goals: Good Progress towards PT goals: Progressing toward goals    Frequency    Min 3X/week      PT Plan Current plan remains appropriate    Co-evaluation              AM-PAC PT "6 Clicks" Mobility   Outcome Measure  Help needed turning from your back to your side while in a flat bed without using bedrails?: None Help needed moving from lying on your back to sitting on the side of a flat bed without using bedrails?: None Help needed moving to and from a bed to a chair (including a wheelchair)?: A Little Help needed standing up from a chair using your arms (e.g., wheelchair or bedside chair)?: A Little Help needed to walk in hospital room?: A Little Help needed climbing 3-5 steps with a railing? : A Little 6 Click Score: 20    End of Session Equipment Utilized During Treatment: Gait belt Activity Tolerance: Patient tolerated treatment well Patient left: in chair;with call bell/phone within reach;with chair alarm set Nurse Communication: Mobility status PT Visit Diagnosis: Unsteadiness on feet (R26.81);Muscle weakness (generalized) (M62.81)     Time: 5056-9794 PT Time Calculation (min) (ACUTE ONLY): 27 min  Charges:  $Gait Training: 23-37 mins                     Verner Mould, DPT Acute Rehabilitation Services Office [phone]  02/27/22 10:54 AM

## 2022-02-28 DIAGNOSIS — N179 Acute kidney failure, unspecified: Secondary | ICD-10-CM | POA: Diagnosis not present

## 2022-02-28 DIAGNOSIS — J189 Pneumonia, unspecified organism: Secondary | ICD-10-CM | POA: Diagnosis not present

## 2022-02-28 DIAGNOSIS — J9601 Acute respiratory failure with hypoxia: Secondary | ICD-10-CM | POA: Diagnosis not present

## 2022-02-28 DIAGNOSIS — E875 Hyperkalemia: Secondary | ICD-10-CM | POA: Diagnosis not present

## 2022-02-28 LAB — BASIC METABOLIC PANEL
Anion gap: 6 (ref 5–15)
BUN: 58 mg/dL — ABNORMAL HIGH (ref 8–23)
CO2: 21 mmol/L — ABNORMAL LOW (ref 22–32)
Calcium: 8.1 mg/dL — ABNORMAL LOW (ref 8.9–10.3)
Chloride: 112 mmol/L — ABNORMAL HIGH (ref 98–111)
Creatinine, Ser: 3.28 mg/dL — ABNORMAL HIGH (ref 0.61–1.24)
GFR, Estimated: 20 mL/min — ABNORMAL LOW (ref >60)
Glucose, Bld: 152 mg/dL — ABNORMAL HIGH (ref 70–99)
Potassium: 4.6 mmol/L (ref 3.5–5.1)
Sodium: 139 mmol/L (ref 135–145)

## 2022-02-28 LAB — GLUCOSE, CAPILLARY: Glucose-Capillary: 143 mg/dL — ABNORMAL HIGH (ref 70–99)

## 2022-02-28 MED ORDER — MYCOPHENOLATE SODIUM 180 MG PO TBEC: 180.0000 mg | DELAYED_RELEASE_TABLET | Freq: Two times a day (BID) | ORAL | Status: DC

## 2022-02-28 NOTE — Progress Notes (Signed)
Occupational Therapy Treatment Patient Details Name: Nathan Tyler MRN: [id number] DOB: 07/22/1955 Today's Date: 02/28/2022   History of present illness 66 y.o. male  past medical history significant for renal transplant chronic renal disease stage IV, diabetes mellitus type 2, essential hypertension admitted to [facility] 02/21/22 for shortness of breath subjective fever and cough was found to have community-acquired pneumonia.   OT comments  Pt ambulating in room, bathroom without AD with supervision for lines. Toileted without assist. Needed reminders to wash hands after BM. Pt with impulsivity, cues to slow pace. SpO2 noted to drop briefly to 88%, but rebounded quickly with rest break and pursed lip breathing.  Reinforced pacing for energy conservation. Pt declined bathing and dressing with OT, stated he wanted a nap, but then asked to sit up in his chair and that he would sleep at home.    Recommendations for follow up therapy are one component of a multi-disciplinary discharge planning process, led by the attending physician.  Recommendations may be updated based on patient status, additional functional criteria and insurance authorization.    Follow Up Recommendations  Home health OT    Assistance Recommended at Discharge PRN  Patient can return home with the following  A little help with bathing/dressing/bathroom;Assistance with cooking/housework;Assist for transportation;A little help with walking and/or transfers   Equipment Recommendations  None recommended by OT    Recommendations for Other Services      Precautions / Restrictions Precautions Precautions:  Precaution Comments: watch O2 Restrictions Weight Bearing Restrictions: No       Mobility Bed Mobility Overal bed mobility: Modified Independent             General bed mobility comments: HOB up, no difficulty    Transfers Overall transfer level: Needs assistance Equipment used: None Transfers: Sit to/from  Stand Sit to Stand: Supervision           General transfer comment: supervised for safety and lines     Balance Overall balance assessment: Needs assistance   Sitting balance-Leahy Scale: Good       Standing balance-Leahy Scale: Fair                             ADL either performed or assessed with clinical judgement   ADL Overall ADL's : Needs assistance/impaired     Grooming: Wash/dry hands;Standing;Supervision/safety Grooming Details (indicate cue type and reason): cues to wash hands after BM, declined oral care                 Toilet Transfer: Supervision/safety;Ambulation;Regular Toilet   Toileting- Water quality scientist and Hygiene: Supervision/safety;Sit to/from stand       Functional mobility during ADLs: Supervision/safety General ADL Comments: reinforced energy conservation strategies    Extremity/Trunk Assessment              Vision       Perception     Praxis      Cognition Arousal/Alertness: Awake/alert Behavior During Therapy: Impulsive Overall Cognitive Status: (P) Impaired/Different from baseline Area of Impairment: (P) Safety/judgement                         Safety/Judgement: (P) Decreased awareness of safety     General Comments: cues to slow pace, pt reports driving, has assistance for medications of his caregiver or wife, reports his wife says it is easier        Exercises  Shoulder Instructions       General Comments      Pertinent Vitals/ Pain       Pain Assessment Pain Assessment: No/denies pain  Home Living                                          Prior Functioning/Environment              Frequency  Min 2X/week        Progress Toward Goals  OT Goals(current goals can now be found in the care plan section)  Progress towards OT goals: Progressing toward goals  Acute Rehab OT Goals OT Goal Formulation: With patient Time For Goal Achievement:  03/10/22 Potential to Achieve Goals: Good  Plan Discharge plan remains appropriate;Frequency remains appropriate    Co-evaluation                 AM-PAC OT "6 Clicks" Daily Activity     Outcome Measure   Help from another person eating meals?: None Help from another person taking care of personal grooming?: A Little Help from another person toileting, which includes using toliet, bedpan, or urinal?: A Little Help from another person bathing (including washing, rinsing, drying)?: A Little Help from another person to put on and taking off regular upper body clothing?: None Help from another person to put on and taking off regular lower body clothing?: A Little 6 Click Score: 20    End of Session    OT Visit Diagnosis: Unsteadiness on feet (R26.81)   Activity Tolerance Patient tolerated treatment well   Patient Left in chair;with call bell/phone within reach   Nurse Communication          Time: 7591-6384 OT Time Calculation (min): 25 min  Charges: OT General Charges $OT Visit: 1 Visit OT Treatments $Self Care/Home Management : 23-37 mins  Cleta Alberts, OTR/L Acute Rehabilitation Services Office: [phone]   Malka So 02/28/2022, 10:49 AM

## 2022-02-28 NOTE — Plan of Care (Signed)
  Problem: Education: Goal: Ability to describe self-care measures that may prevent or decrease complications (Diabetes Survival Skills Education) will improve Outcome: Progressing Goal: Individualized Educational Video(s) Outcome: Progressing   

## 2022-02-28 NOTE — Discharge Summary (Addendum)
Physician Discharge Summary  Nathan Tyler UDJ:[id number] DOB: 03-02-1956 DOA: 02/21/2022  PCP: Celene Squibb, MD  Admit date: 02/21/2022 Discharge date: 02/28/2022  Admitted From: Home Disposition:  Home  Recommendations for Outpatient Follow-up:  Follow up with PCP in 1-2 weeks Please obtain BMP/CBC in one week  Home Health:No Equipment/Devices:None  Discharge Condition:Stable CODE STATUS:Full Diet recommendation: Heart Healthy   Brief/Interim Summary: 66 y.o. male  past medical history significant for renal transplant in 2010 chronically immunosuppressed, chronic renal disease stage IV with a baseline creatinine around 3-4, diabetes mellitus type 2, essential hypertension admitted to [facility] for shortness of breath subjective fever and cough was found to have community-acquired pneumonia.   Discharge Diagnoses:  Principal Problem:   CAP (community acquired pneumonia) Active Problems:   Essential hypertension   Acute renal failure superimposed on stage 4 chronic kidney disease (HCC)   Acute on chronic anemia   Acute metabolic encephalopathy   Hyperkalemia   DM2 (diabetes mellitus, type 2) (HCC)   Chronic diastolic CHF (congestive heart failure) (HCC)  Community-acquired pneumonia: He was started empirically on antibiotics and oxygen as saturations were less than 90%. Culture data remain negative. He completed his course of antibiotics in house. Physical therapy evaluated the patient recommended home health PT  Chronic kidney disease stage IV: Creatinine appears to be at baseline no changes made to his medication.  Hyperkalemia: He was given Lokelma and Kayexalate his potassium was heart to bring down he was started on bicarbonate tablets after 2 to days of Kayexalate and Lokelma his potassium came down. Now 3.6 he will need to follow-up with primary care doctor as an outpatient check a basic metabolic panel.  Non-anion gap metabolic acidosis: Likely due to chronic renal  disease he was continue on oral bicarbonate tablets.  Anemia of chronic renal disease: Hemoglobin remained stable.  Essential hypertension: Which is made to his medication continue current regimen.  Chronic diastolic heart failure: No changes made to his medication.  Discharge Instructions  Discharge Instructions     Diet - low sodium heart healthy   Complete by: As directed    Increase activity slowly   Complete by: As directed       Allergies as of 02/28/2022   No Known Allergies      Medication List     TAKE these medications    amLODipine 10 MG tablet Commonly known as: NORVASC Take 10 mg by mouth daily.   atorvastatin 10 MG tablet Commonly known as: LIPITOR Take 10 mg by mouth daily.   Austedo 6 MG Tabs Generic drug: Deutetrabenazine Take 6 mg by mouth 2 (two) times daily.   carvedilol 12.5 MG tablet Commonly known as: COREG Take 12.5 mg by mouth 2 (two) times daily.   furosemide 20 MG tablet Commonly known as: LASIX Take 20 mg by mouth daily.   hydrALAZINE 50 MG tablet Commonly known as: APRESOLINE Take 50 mg by mouth 3 (three) times daily.   levothyroxine 112 MCG tablet Commonly known as: SYNTHROID Take 112 mcg by mouth daily.   lipase/protease/amylase 36000 UNITS Cpep capsule Commonly known as: Creon Take 2 capsules (72,000 Units total) by mouth 3 (three) times daily with meals. Take 1 with snacks   mycophenolate 180 MG EC tablet Commonly known as: MYFORTIC Take 1 tablet (180 mg total) by mouth 2 (two) times daily. What changed:  how much to take when to take this   NovoLOG 100 UNIT/ML injection Generic drug: insulin aspart SMARTSIG:0-200 Unit(s) SUB-Q Every  3 Days   Omnipod DASH PDM (Gen 4) Kit See admin instructions.   Omnipod DASH Pods (Gen 4) Misc Inject into the skin.   oxyCODONE 5 MG immediate release tablet Commonly known as: Roxicodone Take 1 tablet (5 mg total) by mouth every 6 (six) hours as needed for up to 12 doses  for severe pain.   predniSONE 5 MG tablet Commonly known as: DELTASONE Take 1 tablet (5 mg total) by mouth daily with breakfast. restart 57m dose 3/10 after completing prednisone taper (2/24 - 3/9)   sodium bicarbonate 650 MG tablet Take 650 mg by mouth 2 (two) times daily.   tacrolimus 0.5 MG capsule Commonly known as: PROGRAF Take 1.5 mg by mouth 2 (two) times daily.   Trulicity 1.5 MZD/6.3OVSopn Generic drug: Dulaglutide Inject 1.5 mg into the skin every 7 (seven) days.        Follow-up Information     [facility]   Specialty: Home Health Services Why: Home health has been arranged. They will contact  you to schedule apt. Contact information: [phone]                No Known Allergies  Consultations: None   Procedures/Studies: CT Chest Wo Contrast  Result Date: 02/22/2022 CLINICAL DATA:  Dyspnea, pneumonia EXAM: CT CHEST WITHOUT CONTRAST TECHNIQUE: Multidetector CT imaging of the chest was performed following the standard protocol without IV contrast. RADIATION DOSE REDUCTION: This exam was performed according to the departmental dose-optimization program which includes automated exposure control, adjustment of the mA and/or kV according to patient size and/or use of iterative reconstruction technique. COMPARISON:  06/03/2019 FINDINGS: Cardiovascular: Extensive multi-vessel coronary artery calcification. Global cardiac size within normal limits. No pericardial effusion. Central pulmonary arteries are mildly enlarged in keeping with changes of pulmonary arterial hypertension. Mild atherosclerotic calcification within the thoracic aorta. No aortic aneurysm. Mediastinum/Nodes: No enlarged mediastinal or axillary lymph nodes. Thyroid gland, trachea, and esophagus demonstrate no significant findings. Lungs/Pleura: Small bilateral pleural effusions are present with associated bibasilar atelectasis.  There are bilateral basilar predominant ground-glass and reticular pulmonary infiltrates which appear distributed in the areas most severely affected on prior CT examination and likely represent residual post inflammatory scarring. There is, however, more focal consolidative infiltrate within the right lower lobe, axial image # 116/5, which appears new from prior examination may represent an acute infectious process. No pneumothorax. No central obstructing lesion. Upper Abdomen: No acute abnormality. Musculoskeletal: Multiple remote bilateral rib fractures are identified. No acute bone abnormality. No lytic or blastic bone lesion. IMPRESSION: 1. Extensive multi-vessel coronary artery calcification. 2. Small bilateral pleural effusions with associated bibasilar atelectasis. 3. Bilateral basilar predominant ground-glass and reticular pulmonary infiltrates which appear distributed in the areas most severely affected on prior CT examination and likely represent residual post inflammatory scarring. 4. More focal consolidative infiltrate within the right lower lobe, which appears new from prior examination may represent a superimposed acute infectious process. Follow-up examination 3 months would be helpful to document resolution. 5. Morphologic changes in keeping with pulmonary arterial hypertension. Aortic Atherosclerosis (ICD10-I70.0). Electronically Signed   By: AFidela SalisburyM.D.   On: 02/22/2022 02:56   CT Head Wo Contrast  Result Date: 02/22/2022 CLINICAL DATA:  Mental status change, unknown cause EXAM: CT HEAD WITHOUT CONTRAST TECHNIQUE: Contiguous axial images were obtained from the base of the skull through the vertex without intravenous contrast. RADIATION DOSE REDUCTION: This exam was performed according  to the departmental dose-optimization program which includes automated exposure control, adjustment of the mA and/or kV according to patient size and/or use of iterative reconstruction technique.  COMPARISON:  12/27/2021 FINDINGS: Brain: Normal anatomic configuration. Parenchymal volume loss is stable, though slightly advanced given the patient's age. Mild periventricular white matter changes are present likely reflecting the sequela of small vessel ischemia. Remote lacunar infarct within the anterior limb of the left internal capsule. No abnormal intra or extra-axial mass lesion or fluid collection. No abnormal mass effect or midline shift. No evidence of acute intracranial hemorrhage or infarct. Ventricular size is normal. Cerebellum unremarkable. Vascular: No asymmetric hyperdense vasculature at the skull base. Skull: Intact Sinuses/Orbits: Left maxillary mucosal thickening noted. No air-fluid level. Remaining paranasal sinuses are clear. Orbits are unremarkable. Other: Mastoid air cells and middle ear cavities are clear. IMPRESSION: 1. No acute intracranial hemorrhage or infarct. 2. Stable mild senescent change. 3. Mild left maxillary sinus disease. Electronically Signed   By: Fidela Salisbury M.D.   On: 02/22/2022 02:47   DG Chest Port 1 View  Result Date: 02/21/2022 CLINICAL DATA:  Shortness of breath, dyspnea.  Hypoglycemia. EXAM: PORTABLE CHEST 1 VIEW COMPARISON:  PA Lat 07/16/2019 FINDINGS: Numerous overlying monitor wires. There is cardiomegaly and mild central vascular distension without overt edema. Heterogeneous opacities in the mid to lower lung fields with peripheral architectural distortion, are probably due to postinflammatory scarring but there is increased patchy haziness in the lung bases concerning for superimposed pneumonia. No pleural effusion is seen. The upper lung fields are generally clear Stable mediastinal widening probably due to lipomatosis. Thoracic spondylosis. IMPRESSION: 1. Increased patchy haziness in the lung bases concerning for pneumonia. Clinical correlation and radiographic follow-up recommended. 2. Cardiomegaly and mild central vascular distension without overt  edema. 3. Chronic scarring in the mid to lower lung fields. Electronically Signed   By: Telford Nab M.D.   On: 02/21/2022 21:04   (Echo, Carotid, EGD, Colonoscopy, ERCP)    Subjective: No complaints  Discharge Exam: Vitals:   02/28/22 0800 02/28/22 0821  BP:  (!) 159/75  Pulse: 61 62  Resp: (!) 22   Temp:    SpO2: 95%    Vitals:   02/28/22 0400 02/28/22 0628 02/28/22 0800 02/28/22 0821  BP: (!) 160/63 (!) 175/70  (!) 159/75  Pulse: (!) 58 (!) 59 61 62  Resp: 16 19 (!) 22   Temp:  98.2 F (36.8 C)    TempSrc:  Oral    SpO2: 93% 94% 95%   Weight:      Height:        General: Pt is alert, awake, not in acute distress Cardiovascular: RRR, S1/S2 +, no rubs, no gallops Respiratory: CTA bilaterally, no wheezing, no rhonchi Abdominal: Soft, NT, ND, bowel sounds + Extremities: no edema, no cyanosis    The results of significant diagnostics from this hospitalization (including imaging, microbiology, ancillary and laboratory) are listed below for reference.     Microbiology: Recent Results (from the past 240 hour(s))  Resp Panel by RT-PCR (Flu A&B, Covid) Anterior Nasal Swab     Status: None   Collection Time: 02/21/22  8:33 PM   Specimen: Anterior Nasal Swab  Result Value Ref Range Status   SARS Coronavirus 2 by RT PCR NEGATIVE NEGATIVE Final    Comment: (NOTE) SARS-CoV-2 target nucleic acids are NOT DETECTED.  The SARS-CoV-2 RNA is generally detectable in upper respiratory specimens during the acute phase of infection. The lowest concentration of SARS-CoV-2 viral  copies this assay can detect is 138 copies/mL. A negative result does not preclude SARS-Cov-2 infection and should not be used as the sole basis for treatment or other patient management decisions. A negative result may occur with  improper specimen collection/handling, submission of specimen other than nasopharyngeal swab, presence of viral mutation(s) within the areas targeted by this assay, and  inadequate number of viral copies(<138 copies/mL). A negative result must be combined with clinical observations, patient history, and epidemiological information. The expected result is Negative.  Fact Sheet for Patients:  EntrepreneurPulse.com.au  Fact Sheet for Healthcare Providers:  IncredibleEmployment.be  This test is no t yet approved or cleared by the [place] FDA and  has been authorized for detection and/or diagnosis of SARS-CoV-2 by FDA under an Emergency Use Authorization (EUA). This EUA will remain  in effect (meaning this test can be used) for the duration of the COVID-19 declaration under Section 564(b)(1) of the Act, 21 U.S.C.section 360bbb-3(b)(1), unless the authorization is terminated  or revoked sooner.       Influenza A by PCR NEGATIVE NEGATIVE Final   Influenza B by PCR NEGATIVE NEGATIVE Final    Comment: (NOTE) The Xpert Xpress SARS-CoV-2/FLU/RSV plus assay is intended as an aid in the diagnosis of influenza from Nasopharyngeal swab specimens and should not be used as a sole basis for treatment. Nasal washings and aspirates are unacceptable for Xpert Xpress SARS-CoV-2/FLU/RSV testing.  Fact Sheet for Patients: EntrepreneurPulse.com.au  Fact Sheet for Healthcare Providers: IncredibleEmployment.be  This test is not yet approved or cleared by the [place] FDA and has been authorized for detection and/or diagnosis of SARS-CoV-2 by FDA under an Emergency Use Authorization (EUA). This EUA will remain in effect (meaning this test can be used) for the duration of the COVID-19 declaration under Section 564(b)(1) of the Act, 21 U.S.C. section 360bbb-3(b)(1), unless the authorization is terminated or revoked.  Performed at [facility], [place] [address], [place], [place] [address]   Blood culture (routine x 2)     Status: None   Collection Time: 02/21/22 10:26 PM    Specimen: BLOOD RIGHT FOREARM  Result Value Ref Range Status   Specimen Description BLOOD RIGHT FOREARM  Final   Special Requests   Final    BOTTLES DRAWN AEROBIC AND ANAEROBIC Blood Culture results may not be optimal due to an excessive volume of blood received in culture bottles   Culture   Final    NO GROWTH 5 DAYS Performed at [facility], [place] [address], [place], [place] [address]    Report Status 02/26/2022 FINAL  Final  Blood culture (routine x 2)     Status: None   Collection Time: 02/21/22 10:36 PM   Specimen: BLOOD LEFT FOREARM  Result Value Ref Range Status   Specimen Description BLOOD LEFT FOREARM  Final   Special Requests   Final    BOTTLES DRAWN AEROBIC AND ANAEROBIC Blood Culture adequate volume   Culture   Final    NO GROWTH 5 DAYS Performed at [facility], [place] [address], [place], [place] [address]    Report Status 02/26/2022 FINAL  Final     Labs: BNP (last 3 results) Recent Labs    02/21/22 2033  BNP 8,003.4*   Basic Metabolic Panel: Recent Labs  Lab 02/21/22 2048 02/21/22 2119 02/22/22 0209 02/23/22 0219 02/24/22 1016 02/25/22 0316 02/26/22 0426 02/27/22 0936 02/28/22 0812  NA 147*   < > 143   < >  143 140 141 140 139  K 5.6*   < > 5.8*   < > 5.1 5.3* 4.7 4.4 4.6  CL 121*  --  120*   < > 118* 114* 115* 114* 112*  CO2 16*  --  16*   < > 16* 19* 20* 20* 21*  GLUCOSE 92  --  94   < > 155* 183* 145* 154* 152*  BUN 88*  --  84*   < > 71* 75* 63* 57* 58*  CREATININE 4.38*  --  4.30*   < > 3.69* 3.91* 3.46* 3.32* 3.28*  CALCIUM 8.1*  --  8.1*   < > 8.1* 7.8* 7.9* 8.0* 8.1*  MG 2.3  --  2.3  --   --   --   --   --   --   PHOS  --   --  6.3*  --   --   --   --   --   --    < > = values in this interval not displayed.   Liver Function Tests: Recent Labs  Lab 02/21/22 2048 02/22/22 0209  AST 16 16  ALT 15 16  ALKPHOS 72 74  BILITOT 0.4 0.3  PROT 5.4* 5.5*  ALBUMIN 3.3* 3.3*   No results for input(s): "LIPASE", "AMYLASE" in the  last 168 hours. No results for input(s): "AMMONIA" in the last 168 hours. CBC: Recent Labs  Lab 02/21/22 2048 02/21/22 2119 02/22/22 0209 02/23/22 1122  WBC 7.9  --  8.1 8.5  NEUTROABS 4.5  --  6.6 4.7  HGB 7.8* 6.8* 7.5* 7.5*  HCT 26.4* 20.0* 25.5* 25.2*  MCV 98.5  --  97.7 96.9  PLT 274  --  253 275   Cardiac Enzymes: No results for input(s): "CKTOTAL", "CKMB", "CKMBINDEX", "TROPONINI" in the last 168 hours. BNP: Invalid input(s): "POCBNP" CBG: Recent Labs  Lab 02/27/22 0751 02/27/22 1147 02/27/22 1610 02/27/22 2141 02/28/22 0744  GLUCAP 151* 145* 172* 174* 143*   D-Dimer No results for input(s): "DDIMER" in the last 72 hours. Hgb A1c No results for input(s): "HGBA1C" in the last 72 hours. Lipid Profile No results for input(s): "CHOL", "HDL", "LDLCALC", "TRIG", "CHOLHDL", "LDLDIRECT" in the last 72 hours. Thyroid function studies No results for input(s): "TSH", "T4TOTAL", "T3FREE", "THYROIDAB" in the last 72 hours.  Invalid input(s): "FREET3" Anemia work up No results for input(s): "VITAMINB12", "FOLATE", "FERRITIN", "TIBC", "IRON", "RETICCTPCT" in the last 72 hours. Urinalysis No results found for: "COLORURINE", "APPEARANCEUR", "LABSPEC", "PHURINE", "GLUCOSEU", "HGBUR", "BILIRUBINUR", "KETONESUR", "PROTEINUR", "UROBILINOGEN", "NITRITE", "LEUKOCYTESUR" Sepsis Labs Recent Labs  Lab 02/21/22 2048 02/22/22 0209 02/23/22 1122  WBC 7.9 8.1 8.5   Microbiology Recent Results (from the past 240 hour(s))  Resp Panel by RT-PCR (Flu A&B, Covid) Anterior Nasal Swab     Status: None   Collection Time: 02/21/22  8:33 PM   Specimen: Anterior Nasal Swab  Result Value Ref Range Status   SARS Coronavirus 2 by RT PCR NEGATIVE NEGATIVE Final    Comment: (NOTE) SARS-CoV-2 target nucleic acids are NOT DETECTED.  The SARS-CoV-2 RNA is generally detectable in upper respiratory specimens during the acute phase of infection. The lowest concentration of SARS-CoV-2 viral copies  this assay can detect is 138 copies/mL. A negative result does not preclude SARS-Cov-2 infection and should not be used as the sole basis for treatment or other patient management decisions. A negative result may occur with  improper specimen collection/handling, submission of specimen other than nasopharyngeal swab, presence  of viral mutation(s) within the areas targeted by this assay, and inadequate number of viral copies(<138 copies/mL). A negative result must be combined with clinical observations, patient history, and epidemiological information. The expected result is Negative.  Fact Sheet for Patients:  EntrepreneurPulse.com.au  Fact Sheet for Healthcare Providers:  IncredibleEmployment.be  This test is no t yet approved or cleared by the [place] FDA and  has been authorized for detection and/or diagnosis of SARS-CoV-2 by FDA under an Emergency Use Authorization (EUA). This EUA will remain  in effect (meaning this test can be used) for the duration of the COVID-19 declaration under Section 564(b)(1) of the Act, 21 U.S.C.section 360bbb-3(b)(1), unless the authorization is terminated  or revoked sooner.       Influenza A by PCR NEGATIVE NEGATIVE Final   Influenza B by PCR NEGATIVE NEGATIVE Final    Comment: (NOTE) The Xpert Xpress SARS-CoV-2/FLU/RSV plus assay is intended as an aid in the diagnosis of influenza from Nasopharyngeal swab specimens and should not be used as a sole basis for treatment. Nasal washings and aspirates are unacceptable for Xpert Xpress SARS-CoV-2/FLU/RSV testing.  Fact Sheet for Patients: EntrepreneurPulse.com.au  Fact Sheet for Healthcare Providers: IncredibleEmployment.be  This test is not yet approved or cleared by the [place] FDA and has been authorized for detection and/or diagnosis of SARS-CoV-2 by FDA under an Emergency Use Authorization (EUA). This EUA  will remain in effect (meaning this test can be used) for the duration of the COVID-19 declaration under Section 564(b)(1) of the Act, 21 U.S.C. section 360bbb-3(b)(1), unless the authorization is terminated or revoked.  Performed at [facility], [place] [address] [place], [place] [address]   Blood culture (routine x 2)     Status: None   Collection Time: 02/21/22 10:26 PM   Specimen: BLOOD RIGHT FOREARM  Result Value Ref Range Status   Specimen Description BLOOD RIGHT FOREARM  Final   Special Requests   Final    BOTTLES DRAWN AEROBIC AND ANAEROBIC Blood Culture results may not be optimal due to an excessive volume of blood received in culture bottles   Culture   Final    NO GROWTH 5 DAYS Performed at [facility], [place] [address] [place], [place] [address]    Report Status 02/26/2022 FINAL  Final  Blood culture (routine x 2)     Status: None   Collection Time: 02/21/22 10:36 PM   Specimen: BLOOD LEFT FOREARM  Result Value Ref Range Status   Specimen Description BLOOD LEFT FOREARM  Final   Special Requests   Final    BOTTLES DRAWN AEROBIC AND ANAEROBIC Blood Culture adequate volume   Culture   Final    NO GROWTH 5 DAYS Performed at [facility], [place] [address], [place], [place] [address]    Report Status 02/26/2022 FINAL  Final     SIGNED:   Charlynne Cousins, MD  [facility] 02/28/2022, 9:17 AM Pager   If 7PM-7AM, please contact night-coverage [email or web address] Password [email or web address]

## 2022-02-28 NOTE — TOC Transition Note (Signed)
Transition of Care [facility]) - CM/SW Discharge Note   Patient Details  Name: Nathan Tyler MRN: [id number] Date of Birth: [birth year]  [facility] of [facility]) CM/SW Contact:  Cyndi Bender, RN Phone Number: 02/28/2022, 10:24 AM   Clinical Narrative:     Patient stable for discharge.  Cory with [place] notified of discharge.  No other TOC needs.   Final next level of care: [place] Barriers to Discharge: Barriers Resolved   Patient Goals and CMS Choice Patient states their goals for this hospitalization and ongoing recovery are:: return home CMS Medicare.gov Compare Post Acute Care list provided to:: Patient Choice offered to / list presented to : Patient  Discharge Placement             Home          Discharge Plan and Services   Discharge Planning Services: CM Consult Post Acute Care Choice: Home Health                    HH Arranged: PT, OT [facility] Agency: [place] Date [place]: 02/26/22 Time [place]: 1218 Representative spoke with at [place]: [place] ([place]) Interventions     Readmission Risk Interventions    02/28/2022   10:24 AM  Readmission Risk Prevention Plan  Transportation Screening Complete  PCP or Specialist Appt within 5-7 Days Complete  Home Care Screening Complete  Medication Review (RN CM) Complete

## 2022-03-09 ENCOUNTER — Encounter (HOSPITAL_COMMUNITY): Payer: Self-pay | Admitting: Internal Medicine

## 2022-03-09 ENCOUNTER — Other Ambulatory Visit: Payer: Self-pay

## 2022-03-09 ENCOUNTER — Emergency Department (HOSPITAL_COMMUNITY): Payer: Medicare Other

## 2022-03-09 ENCOUNTER — Inpatient Hospital Stay (HOSPITAL_COMMUNITY)
Admission: EM | Admit: 2022-03-09 | Discharge: 2022-03-27 | DRG: 673 | Disposition: A | Discharge status: Home Health | Payer: Medicare Other | Attending: Internal Medicine | Admitting: Internal Medicine

## 2022-03-09 DIAGNOSIS — J9621 Acute and chronic respiratory failure with hypoxia: Secondary | ICD-10-CM | POA: Diagnosis present

## 2022-03-09 DIAGNOSIS — D631 Anemia in chronic kidney disease: Secondary | ICD-10-CM | POA: Diagnosis present

## 2022-03-09 DIAGNOSIS — J90 Pleural effusion, not elsewhere classified: Secondary | ICD-10-CM | POA: Diagnosis not present

## 2022-03-09 DIAGNOSIS — Y830 Surgical operation with transplant of whole organ as the cause of abnormal reaction of the patient, or of later complication, without mention of misadventure at the time of the procedure: Secondary | ICD-10-CM | POA: Diagnosis present

## 2022-03-09 DIAGNOSIS — E1165 Type 2 diabetes mellitus with hyperglycemia: Secondary | ICD-10-CM | POA: Diagnosis not present

## 2022-03-09 DIAGNOSIS — R5381 Other malaise: Secondary | ICD-10-CM | POA: Diagnosis present

## 2022-03-09 DIAGNOSIS — Z79890 Hormone replacement therapy (postmenopausal): Secondary | ICD-10-CM

## 2022-03-09 DIAGNOSIS — R0902 Hypoxemia: Secondary | ICD-10-CM | POA: Diagnosis not present

## 2022-03-09 DIAGNOSIS — Z4901 Encounter for fitting and adjustment of extracorporeal dialysis catheter: Secondary | ICD-10-CM | POA: Diagnosis not present

## 2022-03-09 DIAGNOSIS — E669 Obesity, unspecified: Secondary | ICD-10-CM | POA: Diagnosis present

## 2022-03-09 DIAGNOSIS — J398 Other specified diseases of upper respiratory tract: Secondary | ICD-10-CM | POA: Diagnosis present

## 2022-03-09 DIAGNOSIS — I129 Hypertensive chronic kidney disease with stage 1 through stage 4 chronic kidney disease, or unspecified chronic kidney disease: Secondary | ICD-10-CM | POA: Diagnosis not present

## 2022-03-09 DIAGNOSIS — Z79621 Long term (current) use of calcineurin inhibitor: Secondary | ICD-10-CM

## 2022-03-09 DIAGNOSIS — I959 Hypotension, unspecified: Secondary | ICD-10-CM | POA: Diagnosis not present

## 2022-03-09 DIAGNOSIS — D649 Anemia, unspecified: Secondary | ICD-10-CM | POA: Diagnosis not present

## 2022-03-09 DIAGNOSIS — Z452 Encounter for adjustment and management of vascular access device: Secondary | ICD-10-CM | POA: Diagnosis not present

## 2022-03-09 DIAGNOSIS — J810 Acute pulmonary edema: Secondary | ICD-10-CM | POA: Diagnosis present

## 2022-03-09 DIAGNOSIS — Z6831 Body mass index (BMI) 31.0-31.9, adult: Secondary | ICD-10-CM

## 2022-03-09 DIAGNOSIS — Z79899 Other long term (current) drug therapy: Secondary | ICD-10-CM

## 2022-03-09 DIAGNOSIS — N184 Chronic kidney disease, stage 4 (severe): Secondary | ICD-10-CM | POA: Diagnosis present

## 2022-03-09 DIAGNOSIS — R0602 Shortness of breath: Secondary | ICD-10-CM | POA: Diagnosis not present

## 2022-03-09 DIAGNOSIS — G9341 Metabolic encephalopathy: Secondary | ICD-10-CM | POA: Diagnosis present

## 2022-03-09 DIAGNOSIS — Z794 Long term (current) use of insulin: Secondary | ICD-10-CM | POA: Diagnosis not present

## 2022-03-09 DIAGNOSIS — Z940 Kidney transplant status: Secondary | ICD-10-CM | POA: Diagnosis not present

## 2022-03-09 DIAGNOSIS — G255 Other chorea: Secondary | ICD-10-CM | POA: Diagnosis present

## 2022-03-09 DIAGNOSIS — T8619 Other complication of kidney transplant: Secondary | ICD-10-CM | POA: Diagnosis present

## 2022-03-09 DIAGNOSIS — Z66 Do not resuscitate: Secondary | ICD-10-CM | POA: Diagnosis present

## 2022-03-09 DIAGNOSIS — N189 Chronic kidney disease, unspecified: Secondary | ICD-10-CM | POA: Diagnosis not present

## 2022-03-09 DIAGNOSIS — J9622 Acute and chronic respiratory failure with hypercapnia: Secondary | ICD-10-CM | POA: Diagnosis present

## 2022-03-09 DIAGNOSIS — Z86010 Personal history of colonic polyps: Secondary | ICD-10-CM

## 2022-03-09 DIAGNOSIS — E1122 Type 2 diabetes mellitus with diabetic chronic kidney disease: Secondary | ICD-10-CM | POA: Diagnosis present

## 2022-03-09 DIAGNOSIS — I130 Hypertensive heart and chronic kidney disease with heart failure and stage 1 through stage 4 chronic kidney disease, or unspecified chronic kidney disease: Secondary | ICD-10-CM | POA: Diagnosis present

## 2022-03-09 DIAGNOSIS — Z7985 Long-term (current) use of injectable non-insulin antidiabetic drugs: Secondary | ICD-10-CM

## 2022-03-09 DIAGNOSIS — I5032 Chronic diastolic (congestive) heart failure: Secondary | ICD-10-CM | POA: Diagnosis present

## 2022-03-09 DIAGNOSIS — E11649 Type 2 diabetes mellitus with hypoglycemia without coma: Secondary | ICD-10-CM | POA: Diagnosis present

## 2022-03-09 DIAGNOSIS — F05 Delirium due to known physiological condition: Secondary | ICD-10-CM | POA: Diagnosis not present

## 2022-03-09 DIAGNOSIS — N170 Acute kidney failure with tubular necrosis: Principal | ICD-10-CM | POA: Diagnosis present

## 2022-03-09 DIAGNOSIS — Z89422 Acquired absence of other left toe(s): Secondary | ICD-10-CM

## 2022-03-09 DIAGNOSIS — R451 Restlessness and agitation: Secondary | ICD-10-CM | POA: Diagnosis not present

## 2022-03-09 DIAGNOSIS — E119 Type 2 diabetes mellitus without complications: Secondary | ICD-10-CM

## 2022-03-09 DIAGNOSIS — Z9641 Presence of insulin pump (external) (internal): Secondary | ICD-10-CM | POA: Diagnosis present

## 2022-03-09 DIAGNOSIS — E8722 Chronic metabolic acidosis: Secondary | ICD-10-CM | POA: Diagnosis present

## 2022-03-09 DIAGNOSIS — R0689 Other abnormalities of breathing: Secondary | ICD-10-CM | POA: Diagnosis not present

## 2022-03-09 DIAGNOSIS — I509 Heart failure, unspecified: Secondary | ICD-10-CM

## 2022-03-09 DIAGNOSIS — E7800 Pure hypercholesterolemia, unspecified: Secondary | ICD-10-CM | POA: Diagnosis present

## 2022-03-09 DIAGNOSIS — Z7952 Long term (current) use of systemic steroids: Secondary | ICD-10-CM

## 2022-03-09 DIAGNOSIS — I5033 Acute on chronic diastolic (congestive) heart failure: Secondary | ICD-10-CM | POA: Diagnosis not present

## 2022-03-09 DIAGNOSIS — E440 Moderate protein-calorie malnutrition: Secondary | ICD-10-CM | POA: Diagnosis present

## 2022-03-09 DIAGNOSIS — E039 Hypothyroidism, unspecified: Secondary | ICD-10-CM | POA: Diagnosis present

## 2022-03-09 DIAGNOSIS — N179 Acute kidney failure, unspecified: Secondary | ICD-10-CM

## 2022-03-09 DIAGNOSIS — Z800 Family history of malignant neoplasm of digestive organs: Secondary | ICD-10-CM

## 2022-03-09 DIAGNOSIS — D84821 Immunodeficiency due to drugs: Secondary | ICD-10-CM | POA: Diagnosis present

## 2022-03-09 DIAGNOSIS — Z9981 Dependence on supplemental oxygen: Secondary | ICD-10-CM

## 2022-03-09 DIAGNOSIS — Z8701 Personal history of pneumonia (recurrent): Secondary | ICD-10-CM

## 2022-03-09 DIAGNOSIS — E875 Hyperkalemia: Secondary | ICD-10-CM | POA: Diagnosis not present

## 2022-03-09 DIAGNOSIS — R339 Retention of urine, unspecified: Secondary | ICD-10-CM | POA: Diagnosis present

## 2022-03-09 DIAGNOSIS — J9601 Acute respiratory failure with hypoxia: Secondary | ICD-10-CM | POA: Diagnosis present

## 2022-03-09 DIAGNOSIS — E8779 Other fluid overload: Secondary | ICD-10-CM | POA: Diagnosis present

## 2022-03-09 LAB — CBC
HCT: 24.3 % — ABNORMAL LOW (ref 39.0–52.0)
Hemoglobin: 7 g/dL — ABNORMAL LOW (ref 13.0–17.0)
MCH: 29 pg (ref 26.0–34.0)
MCHC: 28.8 g/dL — ABNORMAL LOW (ref 30.0–36.0)
MCV: 100.8 fL — ABNORMAL HIGH (ref 80.0–100.0)
Platelets: 251 10*3/uL (ref 150–400)
RBC: 2.41 MIL/uL — ABNORMAL LOW (ref 4.22–5.81)
RDW: 15.8 % — ABNORMAL HIGH (ref 11.5–15.5)
WBC: 6.1 10*3/uL (ref 4.0–10.5)
nRBC: 0.3 % — ABNORMAL HIGH (ref 0.0–0.2)

## 2022-03-09 LAB — COMPREHENSIVE METABOLIC PANEL
ALT: 22 U/L (ref 0–44)
AST: 24 U/L (ref 15–41)
Albumin: 3.6 g/dL (ref 3.5–5.0)
Alkaline Phosphatase: 75 U/L (ref 38–126)
Anion gap: 9 (ref 5–15)
BUN: 96 mg/dL — ABNORMAL HIGH (ref 8–23)
CO2: 21 mmol/L — ABNORMAL LOW (ref 22–32)
Calcium: 7.7 mg/dL — ABNORMAL LOW (ref 8.9–10.3)
Chloride: 112 mmol/L — ABNORMAL HIGH (ref 98–111)
Creatinine, Ser: 5.9 mg/dL — ABNORMAL HIGH (ref 0.61–1.24)
GFR, Estimated: 10 mL/min — ABNORMAL LOW (ref >60)
Glucose, Bld: 73 mg/dL (ref 70–99)
Potassium: 5 mmol/L (ref 3.5–5.1)
Sodium: 142 mmol/L (ref 135–145)
Total Bilirubin: 0.6 mg/dL (ref 0.3–1.2)
Total Protein: 5.6 g/dL — ABNORMAL LOW (ref 6.5–8.1)

## 2022-03-09 LAB — BLOOD GAS, VENOUS
Acid-base deficit: 5.4 mmol/L — ABNORMAL HIGH (ref 0.0–2.0)
Bicarbonate: 21.6 mmol/L (ref 20.0–28.0)
O2 Saturation: 80.8 %
Patient temperature: 37
pCO2, Ven: 47 mmHg (ref 44–60)
pH, Ven: 7.27 (ref 7.25–7.43)
pO2, Ven: 49 mmHg — ABNORMAL HIGH (ref 32–45)

## 2022-03-09 LAB — CBG MONITORING, ED
Glucose-Capillary: 47 mg/dL — ABNORMAL LOW (ref 70–99)
Glucose-Capillary: 45 mg/dL — ABNORMAL LOW (ref 70–99)
Glucose-Capillary: 97 mg/dL (ref 70–99)

## 2022-03-09 LAB — PROCALCITONIN: Procalcitonin: 0.18 ng/mL

## 2022-03-09 LAB — GLUCOSE, CAPILLARY: Glucose-Capillary: 139 mg/dL — ABNORMAL HIGH (ref 70–99)

## 2022-03-09 LAB — TROPONIN I (HIGH SENSITIVITY)
Troponin I (High Sensitivity): 12 ng/L (ref <18)
Troponin I (High Sensitivity): 11 ng/L (ref <18)

## 2022-03-09 LAB — BRAIN NATRIURETIC PEPTIDE: B Natriuretic Peptide: 1590.8 pg/mL — ABNORMAL HIGH (ref 0.0–100.0)

## 2022-03-09 MED ORDER — LEVOFLOXACIN IN D5W 500 MG/100ML IV SOLN
500.0000 mg | Freq: Once | INTRAVENOUS | Status: AC
  Administered 2022-03-09: 500 mg via INTRAVENOUS
  Filled 2022-03-09: qty 100

## 2022-03-09 MED ORDER — POLYETHYLENE GLYCOL 3350 17 G PO PACK: 17.0000 g | PACK | Freq: Every day | ORAL | Status: DC | PRN

## 2022-03-09 MED ORDER — MYCOPHENOLATE SODIUM 180 MG PO TBEC
180.0000 mg | DELAYED_RELEASE_TABLET | Freq: Two times a day (BID) | ORAL | Status: DC
  Administered 2022-03-10 – 2022-03-27 (×33): 180 mg via ORAL
  Filled 2022-03-09 (×41): qty 1

## 2022-03-09 MED ORDER — LEVOFLOXACIN IN D5W 500 MG/100ML IV SOLN: 500.0000 mg | INTRAVENOUS | Status: DC

## 2022-03-09 MED ORDER — ONDANSETRON HCL 4 MG PO TABS: 4.0000 mg | ORAL_TABLET | Freq: Four times a day (QID) | ORAL | Status: DC | PRN

## 2022-03-09 MED ORDER — MYCOPHENOLATE MOFETIL 500 MG PO TABS: 500.0000 mg | ORAL_TABLET | Freq: Two times a day (BID) | ORAL | Status: DC

## 2022-03-09 MED ORDER — METHYLPREDNISOLONE SODIUM SUCC 125 MG IJ SOLR
125.0000 mg | Freq: Once | INTRAMUSCULAR | Status: AC
  Administered 2022-03-09: 125 mg via INTRAVENOUS
  Filled 2022-03-09: qty 2

## 2022-03-09 MED ORDER — INSULIN ASPART 100 UNIT/ML IJ SOLN
0.0000 [IU] | Freq: Every day | INTRAMUSCULAR | Status: DC
  Administered 2022-03-10: 2 [IU] via SUBCUTANEOUS
  Administered 2022-03-15: 4 [IU] via SUBCUTANEOUS
  Administered 2022-03-16: 5 [IU] via SUBCUTANEOUS

## 2022-03-09 MED ORDER — DEXTROSE 50 % IV SOLN: 25.0000 mL | Freq: Once | INTRAVENOUS | Status: DC

## 2022-03-09 MED ORDER — CARVEDILOL 12.5 MG PO TABS
12.5000 mg | ORAL_TABLET | Freq: Two times a day (BID) | ORAL | Status: DC
  Administered 2022-03-09 – 2022-03-10 (×3): 12.5 mg via ORAL
  Filled 2022-03-09 (×3): qty 1

## 2022-03-09 MED ORDER — ONDANSETRON HCL 4 MG/2ML IJ SOLN: 4.0000 mg | Freq: Four times a day (QID) | INTRAMUSCULAR | Status: DC | PRN

## 2022-03-09 MED ORDER — ACETAMINOPHEN 650 MG RE SUPP: 650.0000 mg | Freq: Four times a day (QID) | RECTAL | Status: DC | PRN

## 2022-03-09 MED ORDER — ACETAMINOPHEN 325 MG PO TABS: 650.0000 mg | ORAL_TABLET | Freq: Four times a day (QID) | ORAL | Status: DC | PRN

## 2022-03-09 MED ORDER — TACROLIMUS 1 MG PO CAPS
1.5000 mg | ORAL_CAPSULE | Freq: Two times a day (BID) | ORAL | Status: DC
  Administered 2022-03-09 – 2022-03-27 (×34): 1.5 mg via ORAL
  Filled 2022-03-09 (×42): qty 1

## 2022-03-09 MED ORDER — HEPARIN SODIUM (PORCINE) 5000 UNIT/ML IJ SOLN
5000.0000 [IU] | Freq: Three times a day (TID) | INTRAMUSCULAR | Status: DC
  Administered 2022-03-09 – 2022-03-27 (×52): 5000 [IU] via SUBCUTANEOUS
  Filled 2022-03-09 (×53): qty 1

## 2022-03-09 MED ORDER — SODIUM CHLORIDE 0.9 % IV SOLN: INTRAVENOUS | Status: DC

## 2022-03-09 MED ORDER — PANCRELIPASE (LIP-PROT-AMYL) 36000-114000 UNITS PO CPEP
72000.0000 [IU] | ORAL_CAPSULE | Freq: Three times a day (TID) | ORAL | Status: DC
  Administered 2022-03-10 – 2022-03-27 (×46): 72000 [IU] via ORAL
  Filled 2022-03-09 (×2): qty 2
  Filled 2022-03-09: qty 6
  Filled 2022-03-09: qty 2
  Filled 2022-03-09: qty 6
  Filled 2022-03-09: qty 2
  Filled 2022-03-09 (×3): qty 6
  Filled 2022-03-09 (×3): qty 2
  Filled 2022-03-09: qty 6
  Filled 2022-03-09 (×7): qty 2
  Filled 2022-03-09: qty 6
  Filled 2022-03-09 (×3): qty 2
  Filled 2022-03-09: qty 6
  Filled 2022-03-09 (×8): qty 2
  Filled 2022-03-09: qty 6
  Filled 2022-03-09 (×4): qty 2
  Filled 2022-03-09 (×2): qty 6
  Filled 2022-03-09 (×2): qty 2
  Filled 2022-03-09: qty 6
  Filled 2022-03-09: qty 2
  Filled 2022-03-09: qty 6
  Filled 2022-03-09 (×6): qty 2
  Filled 2022-03-09 (×2): qty 6

## 2022-03-09 MED ORDER — ALBUTEROL SULFATE HFA 108 (90 BASE) MCG/ACT IN AERS: 2.0000 | INHALATION_SPRAY | RESPIRATORY_TRACT | Status: DC | PRN

## 2022-03-09 MED ORDER — PREDNISONE 10 MG PO TABS
5.0000 mg | ORAL_TABLET | Freq: Every day | ORAL | Status: DC
  Administered 2022-03-10 – 2022-03-27 (×17): 5 mg via ORAL
  Filled 2022-03-09 (×17): qty 1

## 2022-03-09 MED ORDER — SENNA 8.6 MG PO TABS
1.0000 | ORAL_TABLET | Freq: Two times a day (BID) | ORAL | Status: DC
  Administered 2022-03-09 – 2022-03-26 (×28): 8.6 mg via ORAL
  Filled 2022-03-09 (×32): qty 1

## 2022-03-09 MED ORDER — MYCOPHENOLATE SODIUM 180 MG PO TBEC: 180.0000 mg | DELAYED_RELEASE_TABLET | Freq: Two times a day (BID) | ORAL | Status: DC

## 2022-03-09 MED ORDER — INSULIN ASPART 100 UNIT/ML IJ SOLN
0.0000 [IU] | Freq: Three times a day (TID) | INTRAMUSCULAR | Status: DC
  Administered 2022-03-10: 5 [IU] via SUBCUTANEOUS
  Administered 2022-03-10: 3 [IU] via SUBCUTANEOUS
  Administered 2022-03-10: 5 [IU] via SUBCUTANEOUS
  Administered 2022-03-11: 2 [IU] via SUBCUTANEOUS
  Administered 2022-03-11 – 2022-03-13 (×4): 1 [IU] via SUBCUTANEOUS
  Administered 2022-03-13 – 2022-03-15 (×4): 2 [IU] via SUBCUTANEOUS
  Administered 2022-03-15 – 2022-03-16 (×2): 5 [IU] via SUBCUTANEOUS
  Administered 2022-03-16: 3 [IU] via SUBCUTANEOUS
  Administered 2022-03-16: 2 [IU] via SUBCUTANEOUS
  Administered 2022-03-17: 3 [IU] via SUBCUTANEOUS
  Administered 2022-03-17: 5 [IU] via SUBCUTANEOUS

## 2022-03-09 MED ORDER — ALBUTEROL SULFATE (2.5 MG/3ML) 0.083% IN NEBU
2.5000 mg | INHALATION_SOLUTION | Freq: Four times a day (QID) | RESPIRATORY_TRACT | Status: DC
  Administered 2022-03-09 – 2022-03-15 (×21): 2.5 mg via RESPIRATORY_TRACT
  Filled 2022-03-09 (×21): qty 3

## 2022-03-09 MED ORDER — SODIUM BICARBONATE 650 MG PO TABS
650.0000 mg | ORAL_TABLET | Freq: Two times a day (BID) | ORAL | Status: DC
  Administered 2022-03-09 – 2022-03-11 (×4): 650 mg via ORAL
  Filled 2022-03-09 (×8): qty 1

## 2022-03-09 MED ORDER — LEVOTHYROXINE SODIUM 112 MCG PO TABS
112.0000 ug | ORAL_TABLET | Freq: Every day | ORAL | Status: DC
  Administered 2022-03-10 – 2022-03-27 (×17): 112 ug via ORAL
  Filled 2022-03-09 (×17): qty 1

## 2022-03-09 MED ORDER — ALBUTEROL SULFATE (2.5 MG/3ML) 0.083% IN NEBU
2.5000 mg | INHALATION_SOLUTION | RESPIRATORY_TRACT | Status: DC | PRN
  Administered 2022-03-11: 2.5 mg via RESPIRATORY_TRACT
  Filled 2022-03-09: qty 3

## 2022-03-09 MED ORDER — DEXTROSE 50 % IV SOLN
50.0000 mL | Freq: Once | INTRAVENOUS | Status: AC
  Administered 2022-03-09: 50 mL via INTRAVENOUS
  Filled 2022-03-09: qty 50

## 2022-03-09 MED ORDER — HYDRALAZINE HCL 20 MG/ML IJ SOLN
10.0000 mg | Freq: Four times a day (QID) | INTRAMUSCULAR | Status: DC | PRN
  Administered 2022-03-13 – 2022-03-15 (×4): 10 mg via INTRAVENOUS
  Filled 2022-03-09 (×4): qty 1

## 2022-03-09 MED ORDER — DEUTETRABENAZINE 6 MG PO TABS
6.0000 mg | ORAL_TABLET | Freq: Two times a day (BID) | ORAL | Status: DC
  Administered 2022-03-10 – 2022-03-11 (×3): 6 mg via ORAL

## 2022-03-09 MED ORDER — ATORVASTATIN CALCIUM 10 MG PO TABS
10.0000 mg | ORAL_TABLET | Freq: Every day | ORAL | Status: DC
  Administered 2022-03-10 – 2022-03-27 (×17): 10 mg via ORAL
  Filled 2022-03-09 (×18): qty 1

## 2022-03-09 NOTE — ED Notes (Signed)
CBG 47. Juice and crackers given.

## 2022-03-09 NOTE — ED Provider Notes (Signed)
[facility] DEPT Provider Note   CSN: [id number] Arrival date & time: 03/09/22  1215     History {Add pertinent medical, surgical, social history, OB history to HPI:1} Chief Complaint  Patient presents with   Shortness of Breath    Nathan Tyler is a 66 y.o. male.  Patient has a history of heart failure and kidney disease along with recent pneumonia.  Patient complains of shortness of breath.  His pulse ox was in the low 80s on room air.  The history is provided by the patient and medical records. No language interpreter was used.  Shortness of Breath      Home Medications Prior to Admission medications   Medication Sig Start Date End Date Taking? Authorizing Provider  amLODipine (NORVASC) 10 MG tablet Take 10 mg by mouth daily. 12/28/21   [provider]  atorvastatin (LIPITOR) 10 MG tablet Take 10 mg by mouth daily.    [provider]  carvedilol (COREG) 12.5 MG tablet Take 12.5 mg by mouth 2 (two) times daily. 12/29/21   [provider]  Deutetrabenazine (AUSTEDO) 6 MG TABS Take 6 mg by mouth 2 (two) times daily.    [provider]  Dulaglutide (TRULICITY) 1.5 VQ/2.5ZD SOPN Inject 1.5 mg into the skin every 7 (seven) days.     [provider]  furosemide (LASIX) 20 MG tablet Take 20 mg by mouth daily.     [provider]  hydrALAZINE (APRESOLINE) 50 MG tablet Take 50 mg by mouth 3 (three) times daily. 02/06/22   [provider]  Insulin Disposable Pump (OMNIPOD DASH PDM, GEN 4,) KIT See admin instructions. 09/20/21   [provider]  Insulin Disposable Pump (OMNIPOD DASH PODS, GEN 4,) MISC Inject into the skin. 02/19/22   [provider]  levothyroxine (SYNTHROID) 112 MCG tablet Take 112 mcg by mouth daily. 02/20/22   [provider]  lipase/protease/amylase (CREON) 36000 UNITS CPEP capsule Take 2 capsules (72,000 Units total) by mouth 3 (three) times daily with  meals. Take 1 with snacks 11/15/21   Annitta Needs, NP  mycophenolate (MYFORTIC) 180 MG EC tablet Take 1 tablet (180 mg total) by mouth 2 (two) times daily. 02/28/22   Charlynne Cousins, MD  NOVOLOG 100 UNIT/ML injection SMARTSIG:0-200 Unit(s) SUB-Q Every 3 Days Patient not taking: Reported on 02/21/2022 11/04/21   [provider]  oxyCODONE (ROXICODONE) 5 MG immediate release tablet Take 1 tablet (5 mg total) by mouth every 6 (six) hours as needed for up to 12 doses for severe pain. Patient not taking: Reported on 02/21/2022 12/21/21   Fransico Meadow, MD  predniSONE (DELTASONE) 5 MG tablet Take 1 tablet (5 mg total) by mouth daily with breakfast. restart 68m dose 3/10 after completing prednisone taper (2/24 - 3/9) 06/16/19   GPatrecia Pour MD  sodium bicarbonate 650 MG tablet Take 650 mg by mouth 2 (two) times daily. 11/18/21   [provider]  tacrolimus (PROGRAF) 0.5 MG capsule Take 1.5 mg by mouth 2 (two) times daily.     [provider]      Allergies    Patient has no known allergies.    Review of Systems   Review of Systems  Respiratory:  Positive for shortness of breath.     Physical Exam Updated Vital Signs BP 128/67   Pulse (!) 50   Temp 97.9 F (36.6 C) (Oral)   Resp 18   Ht 6' (1.829 m)  Wt 104 kg   SpO2 96%   BMI 31.10 kg/m  Physical Exam  ED Results / Procedures / Treatments   Labs (all labs ordered are listed, but only abnormal results are displayed) Labs Reviewed  BLOOD GAS, VENOUS - Abnormal; Notable for the following components:      Result Value   pO2, Ven 49 (*)    Acid-base deficit 5.4 (*)    All other components within normal limits  CBC - Abnormal; Notable for the following components:   RBC 2.41 (*)    Hemoglobin 7.0 (*)    HCT 24.3 (*)    MCV 100.8 (*)    MCHC 28.8 (*)    RDW 15.8 (*)    nRBC 0.3 (*)    All other components within normal limits  COMPREHENSIVE METABOLIC PANEL - Abnormal; Notable for the following  components:   Chloride 112 (*)    CO2 21 (*)    BUN 96 (*)    Creatinine, Ser 5.90 (*)    Calcium 7.7 (*)    Total Protein 5.6 (*)    GFR, Estimated 10 (*)    All other components within normal limits  BRAIN NATRIURETIC PEPTIDE - Abnormal; Notable for the following components:   B Natriuretic Peptide 1,590.8 (*)    All other components within normal limits  TROPONIN I (HIGH SENSITIVITY)  TROPONIN I (HIGH SENSITIVITY)    EKG None  Radiology DG Chest 2 View  Result Date: 03/09/2022 CLINICAL DATA:  SOB EXAM: CHEST - 2 VIEW COMPARISON:  02/21/22 FINDINGS: Very low lung volumes. There bilateral patchy airspace opacities which may represent a combination of bronchovascular crowding, pulmonary edema, and possible multifocal infection. Possible trace left pleural effusion. Cardiac and mediastinal contours are enlarged and likely unchanged when accounting for differences in lung volumes. Visualized upper abdomen is unremarkable. The lateral views essentially nondiagnostic, but the vertebral body heights appear maintained. IMPRESSION: Very low lung volumes. Bilateral patchy airspace opacities may represent a combination of bronchovascular crowding, pulmonary edema, and possible multifocal infection. Electronically Signed   By: Marin Roberts M.D.   On: 03/09/2022 13:28    Procedures Procedures  {Document cardiac monitor, telemetry assessment procedure when appropriate:1}  Medications Ordered in ED Medications  albuterol (VENTOLIN HFA) 108 (90 Base) MCG/ACT inhaler 2 puff (has no administration in time range)  levofloxacin (LEVAQUIN) IVPB 500 mg (500 mg Intravenous New Bag/Given 03/09/22 1610)    ED Course/ Medical Decision Making/ A&P                           Medical Decision Making Amount and/or Complexity of Data Reviewed Labs: ordered. Radiology: ordered. ECG/medicine tests: ordered.  Risk Prescription drug management. Decision regarding hospitalization.   Patient will be  admitted to medicine for hypoxia congestive heart failure and AKI  {Document critical care time when appropriate:1} {Document review of labs and clinical decision tools ie heart score, Chads2Vasc2 etc:1}  {Document your independent review of radiology images, and any outside records:1} {Document your discussion with family members, caretakers, and with consultants:1} {Document social determinants of health affecting pt's care:1} {Document your decision making why or why not admission, treatments were needed:1} Final Clinical Impression(s) / ED Diagnoses Final diagnoses:  Hypoxia    Rx / DC Orders ED Discharge Orders     None

## 2022-03-09 NOTE — Progress Notes (Signed)
PHARMACY NOTE:  ANTIMICROBIAL RENAL DOSAGE ADJUSTMENT  Current antimicrobial regimen includes a mismatch between antimicrobial dosage and estimated renal function.  As per policy approved by the Pharmacy & Therapeutics and Medical Executive Committees, the antimicrobial dosage will be adjusted accordingly.  Current antimicrobial dosage:  Levquin 500 mg IV q24h  Indication: PNA  Renal Function:  Estimated Creatinine Clearance: 14.9 mL/min (A) (by C-G formula based on SCr of 5.9 mg/dL (H)). '[]'$      On intermittent HD, scheduled: '[]'$      On CRRT    Antimicrobial dosage has been changed to:  Levaquin 500 mg IV q48h   Thank you for allowing pharmacy to be a part of this patient's care.  Lynelle Doctor, [facility] 03/09/2022 6:24 PM

## 2022-03-09 NOTE — H&P (Addendum)
[facility] History and Physical  Nathan Tyler PJK:[id number] DOB: 1956-04-03 DOA: 03/09/2022 PCP: Celene Squibb, MD  Admitted from: home Chief Complaint: Shortness of breath  History of Present Illness: Nathan Tyler is a 66 y.o. male with PMH significant for renal transplantation 2008, chronically immunosuppressed, CKD 4 (baseline creatinine 3-4, follows up with Dr. Marval Regal), DM 2 on insulin pump, HTN, HLD, hypothyroidism, movement disorder Recently hospitalized 11/1-11/8 for community-acquired pneumonia, did not show any growth in cultures, completed course of antibiotics in the hospital and discharged to home with home health RN, PT.  Lives at home with his wife. For the first 2 days after discharge, patient feels good.  He then started to have progressive generalized weakness.  No fever.  No worsening of cough.  No burning urination. Today, patient went to urgent care for shortness of breath.  Noted to have O2 sat low at 80% on room air, given DuoNeb without significant improvement in his symptoms ED.  Initial vitals in the ED Afebrile, heart rate 54, respirate 20, blood pressure 130 479, O2 sat 93% on 4 L. VBG with pH 7.27, PCO2 57, bicarb 22 BMP with sodium 142, potassium 5, BUN/creatinine 96/5.9,  hemoglobin low at 7,  WBC count normal at 6.1 BNP elevated to 1591, troponin normal at 12 Chest x-ray showed bilateral patchy airspace opacities which may represent a combination of bronchovascular crowding, pulmonary edema, and possible multifocal infection. Patient was given 1 dose of IV Levaquin, breathing treatment Hospitalist service was consulted for inpatient admission and management.  At the time of my evaluation, patient was lying down in bed.  On 4 L oxygen by nasal cannula. Wife at bedside.  Follows up with nephrology Dr. Marval Regal.  Confirmed the details of the history as stated above.  Blood sugar level was low at 45 checked just before my evaluation.  Patient was  alert, awake, oriented x3.  He is on insulin pump but for Premeal boluses only.  Not on basal insulin.  He last ate this morning, several hours ago.  Review of Systems:  All systems were reviewed and were negative unless otherwise mentioned in the HPI   Past medical history: Past Medical History:  Diagnosis Date   Chorea    Diabetes mellitus without complication ([place])    Essential hypertension    Hypercholesteremia    Hypothyroidism    Neuromuscular disorder ([place])    PONV (postoperative nausea and vomiting)    Renal failure    Has had kidney transplant 2008    Past surgical history: Past Surgical History:  Procedure Laterality Date   AMPUTATION Right 06/27/2016   Procedure: AMPUTATION 5TH TOE RIGHT FOOT;  Surgeon: Caprice Beaver, DPM;  Location: [facility];  Service: Podiatry;  Laterality: Right;   AMPUTATION TOE Left 09/02/2015   Procedure: AMPUTATION  2ND TOE LEFT FOOT;  Surgeon: Aviva Signs, MD;  Location: [facility];  Service: General;  Laterality: Left;   BIOPSY  11/24/2021   Procedure: BIOPSY;  Surgeon: Daneil Dolin, MD;  Location: [facility];  Service: Endoscopy;;   CATARACT EXTRACTION Bilateral    COLONOSCOPY  01/2014   done in [place]. patient reports h/o colon polyps on TCS in 2010, 2015 colonoscopy normal but advised to come back in 2020.    COLONOSCOPY WITH PROPOFOL N/A 01/19/2019   Procedure: COLONOSCOPY WITH PROPOFOL;  Surgeon: Daneil Dolin, MD;  Location: [facility];  Service: Endoscopy;  Laterality: N/A;  9:45am   COLONOSCOPY WITH PROPOFOL N/A  11/24/2021   Procedure: COLONOSCOPY WITH PROPOFOL;  Surgeon: Daneil Dolin, MD;  Location: [facility];  Service: Endoscopy;  Laterality: N/A;  2:15pm   KIDNEY TRANSPLANT Right 2010   POLYPECTOMY  01/19/2019   Procedure: POLYPECTOMY;  Surgeon: Daneil Dolin, MD;  Location: [facility];  Service: Endoscopy;;   POLYPECTOMY  11/24/2021   Procedure: POLYPECTOMY;  Surgeon: Daneil Dolin, MD;  Location: [facility];   Service: Endoscopy;;    Social History:  reports that he has never smoked. He has never been exposed to tobacco smoke. He has never used smokeless tobacco. He reports current alcohol use. He reports that he does not use drugs.  Allergies:  No Known Allergies Patient has no known allergies.   Family history:  Family History  Problem Relation Age of Onset   Colon cancer Maternal Aunt 75       passed from [place]: Prior to Admission medications   Medication Sig Start Date End Date Taking? Authorizing Provider  amLODipine (NORVASC) 10 MG tablet Take 10 mg by mouth daily. 12/28/21   [provider]  atorvastatin (LIPITOR) 10 MG tablet Take 10 mg by mouth daily.    [provider]  carvedilol (COREG) 12.5 MG tablet Take 12.5 mg by mouth 2 (two) times daily. 12/29/21   [provider]  Deutetrabenazine (AUSTEDO) 6 MG TABS Take 6 mg by mouth 2 (two) times daily.    [provider]  Dulaglutide (TRULICITY) 1.5 FT/7.3UK SOPN Inject 1.5 mg into the skin every 7 (seven) days.     [provider]  furosemide (LASIX) 20 MG tablet Take 20 mg by mouth daily.     [provider]  hydrALAZINE (APRESOLINE) 50 MG tablet Take 50 mg by mouth 3 (three) times daily. 02/06/22   [provider]  Insulin Disposable Pump (OMNIPOD DASH PDM, GEN 4,) KIT See admin instructions. 09/20/21   [provider]  Insulin Disposable Pump (OMNIPOD DASH PODS, GEN 4,) MISC Inject into the skin. 02/19/22   [provider]  levothyroxine (SYNTHROID) 112 MCG tablet Take 112 mcg by mouth daily. 02/20/22   [provider]  lipase/protease/amylase (CREON) 36000 UNITS CPEP capsule Take 2 capsules (72,000 Units total) by mouth 3 (three) times daily with meals. Take 1 with snacks 11/15/21   Annitta Needs, NP  mycophenolate (MYFORTIC) 180 MG EC tablet Take 1 tablet (180 mg total) by mouth 2 (two) times daily. 02/28/22   Charlynne Cousins, MD   NOVOLOG 100 UNIT/ML injection SMARTSIG:0-200 Unit(s) SUB-Q Every 3 Days Patient not taking: Reported on 02/21/2022 11/04/21   [provider]  oxyCODONE (ROXICODONE) 5 MG immediate release tablet Take 1 tablet (5 mg total) by mouth every 6 (six) hours as needed for up to 12 doses for severe pain. Patient not taking: Reported on 02/21/2022 12/21/21   Fransico Meadow, MD  predniSONE (DELTASONE) 5 MG tablet Take 1 tablet (5 mg total) by mouth daily with breakfast. restart 25m dose 3/10 after completing prednisone taper (2/24 - 3/9) 06/16/19   GPatrecia Pour MD  sodium bicarbonate 650 MG tablet Take 650 mg by mouth 2 (two) times daily. 11/18/21   [provider]  tacrolimus (PROGRAF) 0.5 MG capsule Take 1.5 mg by mouth 2 (two) times daily.     [provider]    Physical Exam: Vitals:   03/09/22 1430 03/09/22 1600 03/09/22 1645 03/09/22 1730  BP: 138/63 128/67  130/70 130/62  Pulse: (!) 50 (!) 50 (!) 52 (!) 51  Resp: _0 Temp:   97.6 F (36.4 C)   TempSrc:   Oral   SpO2: 96% 96% 96% 97%  Weight:      Height:       Wt Readings from Last 3 Encounters:  03/09/22 104 kg  02/22/22 104.3 kg  11/24/21 107 kg   Body mass index is 31.1 kg/m.  General exam: Pleasant, elderly Caucasian male.  Not in physical distress Skin: No rashes, lesions or ulcers. HEENT: Atraumatic, normocephalic, no obvious bleeding Lungs: Mild bilateral scattered wheezing CVS: Regular rate and rhythm, no murmur GI/Abd soft, nontender, nondistended, bowels are present CNS: Alert, awake, oriented x3 Psychiatry: Mood appropriate Extremities: No pedal edema, no calf tenderness     Consult Orders  (From admission, onward)           Start     Ordered   03/09/22 1722  PT eval and treat  Routine        03/09/22 1721   03/09/22 1550  Consult to hospitalist  Once       Provider:  (Not yet assigned)  Question Answer Comment  Place call to: [facility]   Reason for Consult  Admit      03/09/22 1549            Labs on Admission:   CBC: Recent Labs  Lab 03/09/22 1240  WBC 6.1  HGB 7.0*  HCT 24.3*  MCV 100.8*  PLT 998    Basic Metabolic Panel: Recent Labs  Lab 03/09/22 1240  NA 142  K 5.0  CL 112*  CO2 21*  GLUCOSE 73  BUN 96*  CREATININE 5.90*  CALCIUM 7.7*    Liver Function Tests: Recent Labs  Lab 03/09/22 1240  AST 24  ALT 22  ALKPHOS 75  BILITOT 0.6  PROT 5.6*  ALBUMIN 3.6   No results for input(s): "LIPASE", "AMYLASE" in the last 168 hours. No results for input(s): "AMMONIA" in the last 168 hours.  Cardiac Enzymes: No results for input(s): "CKTOTAL", "CKMB", "CKMBINDEX", "TROPONINI" in the last 168 hours.  BNP (last 3 results) Recent Labs    02/21/22 2033 03/09/22 1240  BNP 1,342.0* 1,590.8*    ProBNP (last 3 results) No results for input(s): "PROBNP" in the last 8760 hours.  CBG: Recent Labs  Lab 03/09/22 1609 03/09/22 1635 03/09/22 1803  GLUCAP 47* 45* 97    Lipase  No results found for: "LIPASE"   Urinalysis No results found for: "COLORURINE", "APPEARANCEUR", "LABSPEC", "PHURINE", "GLUCOSEU", "HGBUR", "BILIRUBINUR", "KETONESUR", "PROTEINUR", "UROBILINOGEN", "NITRITE", "LEUKOCYTESUR"   Drugs of Abuse  No results found for: "LABOPIA", "COCAINSCRNUR", "LABBENZ", "AMPHETMU", "THCU", "LABBARB"    Radiological Exams on Admission: DG Chest 2 View  Result Date: 03/09/2022 CLINICAL DATA:  SOB EXAM: CHEST - 2 VIEW COMPARISON:  02/21/22 FINDINGS: Very low lung volumes. There bilateral patchy airspace opacities which may represent a combination of bronchovascular crowding, pulmonary edema, and possible multifocal infection. Possible trace left pleural effusion. Cardiac and mediastinal contours are enlarged and likely unchanged when accounting for differences in lung volumes. Visualized upper abdomen is unremarkable. The lateral views essentially nondiagnostic, but the vertebral body heights appear  maintained. IMPRESSION: Very low lung volumes. Bilateral patchy airspace opacities may represent a combination of bronchovascular crowding, pulmonary edema, and possible multifocal infection. Electronically Signed   By: Marin Roberts M.D.   On: 03/09/2022 13:28     ------------------------------------------------------------------------------------------------------ Assessment/Plan:  Principal Problem:   Acute exacerbation of CHF (congestive heart failure) ([place])  Acute respiratory failure with hypoxia Presented with progressive shortness of breath.  No fever or worsening of cough.  Required 4 L nasal cannula to maintain saturation more than 90% On my exam, patient has bilateral scattered wheezing, no crackles or cough on deep breathing. WBC count not elevated.  Chest x-ray shows nonspecific bilateral patchy opacities. Recently completed course of antibiotics for pneumonia. He received 1 dose of IV Levaquin in the ED for suspected pneumonia.  Because of immunocompromise status, I will continue empiric coverage with Levaquin for now.  I will give 1 dose of IV Solu-Medrol 125 mg. No pedal edema on exam. Doesn't look volume overloaded. Labs showed elevated BNP to 1591.  Seems to have elevated BNP at baseline.  It was 1342 two weeks ago.   I would start gentle hydration with NS at 50 mill per hour overnight.  Recheck BMP in creatinine in the morning. Last echo from 2021 with EF 55 to 29%, grade 1 diastolic dysfunction.  Obtain echocardiogram.  Obtain procalcitonin level.  Repeat BNP level in the morning Recent Labs  Lab 03/09/22 1240  WBC 6.1   Renal transplantation status 2008 Chronically immunosuppressed PTA on tacrolimus, mycophenolate, low-dose prednisone. Resume all.  AKI on CKD 4 Chronic metabolic acidosis baseline creatinine 3-4 Presented with creatinine elevated to 5.9.  Monitor on gentle IV fluids Nephrologist Dr. Marval Regal.  Will consult with nephrologist in the morning. Continue  sodium bicarb 650 mg twice daily Recent Labs    12/27/21 1823 02/21/22 2048 02/22/22 0209 02/23/22 0219 02/24/22 1016 02/25/22 0316 02/26/22 0426 02/27/22 0936 02/28/22 0812 03/09/22 1240  BUN 89* 88* 84* 87* 71* 75* 63* 57* 58* 96*  CREATININE 3.56* 4.38* 4.30* 4.25* 3.69* 3.91* 3.46* 3.32* 3.28* 5.90*  CO2 19* 16* 16* 16* 16* 19* 20* 20* 21* 21*    Chronic anemia Hemoglobin close to 7 at baseline.  Probably due to CKD.  No active bleeding. Recent Labs    02/21/22 2048 02/21/22 2119 02/22/22 0209 02/22/22 0829 02/23/22 1122 03/09/22 1240  HGB 7.8* 6.8* 7.5*  --  7.5* 7.0*  MCV 98.5  --  97.7  --  96.9 100.8*  VITAMINB12  --   --   --  277  --   --   FOLATE  --   --   --  11.6  --   --   FERRITIN  --   --   --  155  --   --   TIBC  --   --   --  260  --   --   IRON  --   --   --  33*  --   --   RETICCTPCT  --   --  3.2*  --   --   --    Type 2 diabetes mellitus with hypOglycemia A1c 5.7 on 93/10/1694 PTA on Trulicity weekly, insulin pump for bolus insulin.  Not on basal.. Because of AKI, probably insulin clearance is down and hence patient is having hypoglycemia. Insulin pump currently not on.  Start on sliding scale insulin with Accu-Cheks. Recent Labs  Lab 03/09/22 1609 03/09/22 1635 03/09/22 1803  GLUCAP 47* 45* 97   Essential hypertension  PTA on Coreg 12.5 mg twice daily, Lasix 20 mg daily, hydralazine 50 mg 3 times daily, amlodipine 10 mg daily. Blood pressure in normal range.  Resume Coreg.  Hold Lasix because of significant AKI.  Hold  others.  Resume as blood pressure demands  HLD Continue Lipitor 10 mg daily  Hypothyroidism Continue Synthroid 112 mcg daily  Movement disorder PTA on [place] 6 mg twice daily.  Resume the same.  Mobility -Obtain PT eval  Goals of care - -  Code Status: Prior DNI.  Confirmed with patient and wife at bedside   Diet: start Cardiac/diabetic diet Diet Order     None      DVT prophylaxis: Heparin subcu     Antimicrobials: None Fluid: None Consultants: Nephrology Family Communication: Wife at bedside Dispo: The patient is from: Home              Anticipated d/c is to: Pending clinical course              Anticipated d/c date is: > 3 days  ------------------------------------------------------------------------------------- Severity of Illness: The appropriate patient status for this patient is INPATIENT. Inpatient status is judged to be reasonable and necessary in order to provide the required intensity of service to ensure the patient's safety. The patient's presenting symptoms, physical exam findings, and initial radiographic and laboratory data in the context of their chronic comorbidities is felt to place them at high risk for further clinical deterioration. Furthermore, it is not anticipated that the patient will be medically stable for discharge from the hospital within 2 midnights of admission.   * I certify that at the point of admission it is my clinical judgment that the patient will require inpatient hospital care spanning beyond 2 midnights from the point of admission due to high intensity of service, high risk for further deterioration and high frequency of surveillance required.*   Signed, Terrilee Croak, MD [facility] 03/09/2022

## 2022-03-09 NOTE — ED Triage Notes (Addendum)
Pt BIBA from UC. Lives at home with wife and has [facility] Therapist, sports. Pt dx pneumonia a few weeks ago. Pt went to UC c/o SOB. SPO2 80 on RA. Given duoneb at 1130. No improvement.   Pt does have Insulin pump on R L abd  Aox4  BP: 134/79 HR: 54 RR: 20 SPO2: 93 4L CBG: 92

## 2022-03-09 NOTE — Progress Notes (Signed)
Pharmacy received consult to manage renal transplant meds.  We do not have an approved protocol for this.  Informed Dr. Pietro Cassis to consult nephrology team to manage these meds for patient.  Dia Sitter, PharmD, BCPS 03/09/2022 6:39 PM

## 2022-03-10 ENCOUNTER — Encounter (HOSPITAL_COMMUNITY): Payer: Self-pay | Admitting: Internal Medicine

## 2022-03-10 ENCOUNTER — Inpatient Hospital Stay (HOSPITAL_COMMUNITY): Payer: Medicare Other

## 2022-03-10 DIAGNOSIS — I5032 Chronic diastolic (congestive) heart failure: Secondary | ICD-10-CM | POA: Diagnosis not present

## 2022-03-10 DIAGNOSIS — J9601 Acute respiratory failure with hypoxia: Secondary | ICD-10-CM | POA: Diagnosis not present

## 2022-03-10 LAB — CBC
HCT: 23.5 % — ABNORMAL LOW (ref 39.0–52.0)
Hemoglobin: 6.7 g/dL — CL (ref 13.0–17.0)
MCH: 29 pg (ref 26.0–34.0)
MCHC: 28.5 g/dL — ABNORMAL LOW (ref 30.0–36.0)
MCV: 101.7 fL — ABNORMAL HIGH (ref 80.0–100.0)
Platelets: 263 10*3/uL (ref 150–400)
RBC: 2.31 MIL/uL — ABNORMAL LOW (ref 4.22–5.81)
RDW: 15.3 % (ref 11.5–15.5)
WBC: 4.8 10*3/uL (ref 4.0–10.5)
nRBC: 0 % (ref 0.0–0.2)

## 2022-03-10 LAB — URINALYSIS, ROUTINE W REFLEX MICROSCOPIC
Bilirubin Urine: NEGATIVE
Glucose, UA: NEGATIVE mg/dL
Hgb urine dipstick: NEGATIVE
Ketones, ur: NEGATIVE mg/dL
Leukocytes,Ua: NEGATIVE
Nitrite: NEGATIVE
Protein, ur: 100 mg/dL — AB
Specific Gravity, Urine: 1.013 (ref 1.005–1.030)
pH: 5 (ref 5.0–8.0)

## 2022-03-10 LAB — PREPARE RBC (CROSSMATCH)

## 2022-03-10 LAB — ECHOCARDIOGRAM COMPLETE
Area-P 1/2: 4.31 cm2
Height: 72 in
S' Lateral: 3.8 cm
Weight: 3668.45 oz

## 2022-03-10 LAB — BASIC METABOLIC PANEL
Anion gap: 9 (ref 5–15)
BUN: 100 mg/dL — ABNORMAL HIGH (ref 8–23)
CO2: 19 mmol/L — ABNORMAL LOW (ref 22–32)
Calcium: 7.7 mg/dL — ABNORMAL LOW (ref 8.9–10.3)
Chloride: 114 mmol/L — ABNORMAL HIGH (ref 98–111)
Creatinine, Ser: 5.78 mg/dL — ABNORMAL HIGH (ref 0.61–1.24)
GFR, Estimated: 10 mL/min — ABNORMAL LOW (ref >60)
Glucose, Bld: 249 mg/dL — ABNORMAL HIGH (ref 70–99)
Potassium: 5.7 mmol/L — ABNORMAL HIGH (ref 3.5–5.1)
Sodium: 142 mmol/L (ref 135–145)

## 2022-03-10 LAB — CREATININE, URINE, RANDOM: Creatinine, Urine: 151 mg/dL

## 2022-03-10 LAB — SODIUM, URINE, RANDOM: Sodium, Ur: <10 mmol/L

## 2022-03-10 LAB — GLUCOSE, CAPILLARY
Glucose-Capillary: 223 mg/dL — ABNORMAL HIGH (ref 70–99)
Glucose-Capillary: 232 mg/dL — ABNORMAL HIGH (ref 70–99)
Glucose-Capillary: 277 mg/dL — ABNORMAL HIGH (ref 70–99)
Glucose-Capillary: 257 mg/dL — ABNORMAL HIGH (ref 70–99)
Glucose-Capillary: 229 mg/dL — ABNORMAL HIGH (ref 70–99)

## 2022-03-10 LAB — BRAIN NATRIURETIC PEPTIDE: B Natriuretic Peptide: 1519 pg/mL — ABNORMAL HIGH (ref 0.0–100.0)

## 2022-03-10 LAB — HIV ANTIBODY (ROUTINE TESTING W REFLEX): HIV Screen 4th Generation wRfx: NONREACTIVE

## 2022-03-10 LAB — PROCALCITONIN: Procalcitonin: 0.1 ng/mL

## 2022-03-10 MED ORDER — SODIUM CHLORIDE 0.9% IV SOLUTION: Freq: Once | INTRAVENOUS | Status: AC

## 2022-03-10 MED ORDER — SODIUM ZIRCONIUM CYCLOSILICATE 10 G PO PACK
10.0000 g | PACK | Freq: Two times a day (BID) | ORAL | Status: AC
  Administered 2022-03-10 (×2): 10 g via ORAL
  Filled 2022-03-10 (×2): qty 1

## 2022-03-10 MED ORDER — INSULIN GLARGINE-YFGN 100 UNIT/ML ~~LOC~~ SOLN
10.0000 [IU] | Freq: Every day | SUBCUTANEOUS | Status: DC
  Administered 2022-03-10 – 2022-03-13 (×3): 10 [IU] via SUBCUTANEOUS
  Filled 2022-03-10 (×5): qty 0.1

## 2022-03-10 NOTE — Progress Notes (Signed)
Mobility Specialist - Progress Note   03/10/22 1429  Mobility  Activity Ambulated with assistance to bathroom  Level of Assistance Contact guard assist, steadying assist  Assistive Device Front wheel walker  Distance Ambulated (ft) 20 ft  Range of Motion/Exercises Active  Activity Response Tolerated well  Mobility Referral Yes  $Mobility charge 1 Mobility   Pt was found in bed and needed assistance to bathroom. Was a little confused during ambulation but had no complaints. At EOS returned to bed with necessities in reach and wife in room.  Ferd Hibbs Mobility Specialist

## 2022-03-10 NOTE — Consult Note (Signed)
Renal Service Consult Note [facility]  Nathan Tyler 03/10/2022 Nathan Blazing, MD Requesting Physician: Nathan. Pietro Tyler  Reason for Consult: Renal failure HPI: The patient is a 66 y.o. year-old w/ PMH as below who presented to ED w/ SOB yesterday. Pt has renal transplant f/b Nathan Tyler and her usual creat is 3.5. Creat here was 5.9 yesterday. Pt admitted and started on O2 and IVF's at 50 cc/hr.  This am, creat is down to 5.7. We are asked to see pt for renal failure.   Pt seen in room. States he usually weighs 240 lbs, maybe 230 lbs because not eating well. No prod cough and no severe SOB.    ROS - denies CP, no joint pain, no HA, no blurry vision, no rash, no diarrhea, no nausea/ vomiting, no dysuria, no difficulty voiding   Past Medical History  Past Medical History:  Diagnosis Date   Chorea    Diabetes mellitus without complication ([place])    Essential hypertension    Hypercholesteremia    Hypothyroidism    Neuromuscular disorder ([place])    PONV (postoperative nausea and vomiting)    Renal failure    Has had kidney transplant 2008   Past Surgical History  Past Surgical History:  Procedure Laterality Date   AMPUTATION Right 06/27/2016   Procedure: AMPUTATION 5TH TOE RIGHT FOOT;  Surgeon: Caprice Beaver, DPM;  Location: [facility];  Service: Podiatry;  Laterality: Right;   AMPUTATION TOE Left 09/02/2015   Procedure: AMPUTATION  2ND TOE LEFT FOOT;  Surgeon: Aviva Signs, MD;  Location: [facility];  Service: General;  Laterality: Left;   BIOPSY  11/24/2021   Procedure: BIOPSY;  Surgeon: Daneil Dolin, MD;  Location: [facility];  Service: Endoscopy;;   CATARACT EXTRACTION Bilateral    COLONOSCOPY  01/2014   done in [place]. patient reports h/o colon polyps on TCS in 2010, 2015 colonoscopy normal but advised to come back in 2020.    COLONOSCOPY WITH PROPOFOL N/A 01/19/2019   Procedure: COLONOSCOPY WITH PROPOFOL;  Surgeon: Daneil Dolin, MD;  Location: [facility];  Service: Endoscopy;  Laterality: N/A;  9:45am   COLONOSCOPY WITH PROPOFOL N/A 11/24/2021   Procedure: COLONOSCOPY WITH PROPOFOL;  Surgeon: Daneil Dolin, MD;  Location: [facility];  Service: Endoscopy;  Laterality: N/A;  2:15pm   KIDNEY TRANSPLANT Right 2010   POLYPECTOMY  01/19/2019   Procedure: POLYPECTOMY;  Surgeon: Daneil Dolin, MD;  Location: [facility];  Service: Endoscopy;;   POLYPECTOMY  11/24/2021   Procedure: POLYPECTOMY;  Surgeon: Daneil Dolin, MD;  [facility]: [facility];  Service: Endoscopy;;   Family History  Family History  Problem Relation Age of Onset   Colon cancer Maternal Aunt 75       passed from [place]  reports that he has never smoked. He has never been exposed to tobacco smoke. He has never used smokeless tobacco. He reports current alcohol use. He reports that he does not use drugs. Allergies No Known Allergies Home medications Prior to Admission medications   Medication Sig Start Date End Date Taking? Authorizing Provider  amLODipine (NORVASC) 10 MG tablet Take 10 mg by mouth daily. 12/28/21  Yes [provider]  atorvastatin (LIPITOR) 10 MG tablet Take 10 mg by mouth daily.   Yes [provider]  carvedilol (COREG) 12.5 MG tablet Take 12.5 mg by mouth 2 (two) times daily. 12/29/21  Yes [provider]  Deutetrabenazine (AUSTEDO) 6 MG TABS Take 6 mg by mouth 2 (two) times daily.   Yes [provider]  furosemide (LASIX) 20 MG tablet Take 20 mg by mouth daily.    Yes [provider]  hydrALAZINE (APRESOLINE) 50 MG tablet Take 50 mg by mouth 3 (three) times daily. 02/06/22  Yes [provider]  Insulin Disposable Pump (OMNIPOD DASH PODS, GEN 4,) MISC Inject 5 Units into the skin 3 (three) times daily. Medication: Novolog family member states usually runs about 5 units TID depending on his sliding scale 02/19/22  Yes [provider]  levothyroxine (SYNTHROID) 112 MCG tablet  Take 112 mcg by mouth daily. 02/20/22  Yes [provider]  lipase/protease/amylase (CREON) 36000 UNITS CPEP capsule Take 2 capsules (72,000 Units total) by mouth 3 (three) times daily with meals. Take 1 with snacks 11/15/21  Yes Annitta Needs, NP  mycophenolate (MYFORTIC) 180 MG EC tablet Take 1 tablet (180 mg total) by mouth 2 (two) times daily. 02/28/22  Yes Charlynne Cousins, MD  predniSONE (DELTASONE) 5 MG tablet Take 1 tablet (5 mg total) by mouth daily with breakfast. restart 15m dose 3/10 after completing prednisone taper (2/24 - 3/9) 06/16/19  Yes GPatrecia Pour MD  sodium bicarbonate 650 MG tablet Take 650 mg by mouth 2 (two) times daily. 11/18/21  Yes [provider]  tacrolimus (PROGRAF) 0.5 MG capsule Take 1.5 mg by mouth 2 (two) times daily.    Yes [provider]  Dulaglutide (TRULICITY) 1.5 MZY/[id number] Inject 1.5 mg into the skin every 7 (seven) days.     [provider]  oxyCODONE (ROXICODONE) 5 MG immediate release tablet Take 1 tablet (5 mg total) by mouth every 6 (six) hours as needed for up to 12 doses for severe pain. Patient not taking: Reported on 03/09/2022 12/21/21   PFransico Meadow MD     Vitals:   03/10/22 0801 03/10/22 0945 03/10/22 0958 03/10/22 1013  BP:  (!) 144/67  137/65  Pulse:  69  66  Resp:  20 (!) 22 (!) 24  Temp:  98.8 F (37.1 C)  99.5 F (37.5 C)  TempSrc:  Axillary  Axillary  SpO2: 96%     Weight:      Height:       Exam Gen alert, no distress No rash, cyanosis or gangrene Sclera anicteric, throat clear  No jvd or bruits Chest clear bilat to bases, no rales/ wheezing RRR no MRG Abd soft ntnd no mass or ascites +bs GU normal male MS no joint effusions or deformity Ext 1+ LLE pretib edema, no other edema Neuro is alert, Ox 3 , nf    Home meds include - coreg 12.5 bid, lasix 20 mg mwf, austedo, synthorid, creon, myfortic 180 bid, prednisone 5 qd, tacrolimus 1.5 bid     Date   Creat  eGFR    2017-2018  1.11- 1.56   2020   1.88   2021   1.54- 2.09   Sept 2023  3.56  18 ml/min    11/1- 02/28/22 4.38 >> 3.28 AKI on CKD4, 14 >> 20 ml/min    11/17   5.90  10     11/18   5.78  10      UA pend    UNa, UCr pend    Renal [place]- pend    CXR - IMPRESSION: Very low lung volumes. Bilateral patchy airspace opacities may represent a combination of bronchovascular  crowding, pulmonary edema, and possible multifocal infection.     Temp 992, wBC 4K  , BP 140/70, HR 60 -70, RR     Assessment/ Plan: AKI on CKD 4 transplant - b/l creatinine appears to be 3.3- 3.6, eGFR 18- 20 ml/min. Creat here was 5.9 in setting of SOB and recent admission for PNA rx'd w/ abx. Pt presenting here w/ SOB. UA pending, [place] pending. Requiring O2 which is new. Creat down slightly w/ IVF"s overnight. Vol status difficult to tell by exam, mild edema L ankle/ pretib, o/w no edema. CXR is borderline. He is under his usual wt (229lbs here, usual 230s- 240s). BP's wnl, no nephrotoxins. Will get UA, urine lytes, prograf level and cont cautious IVF"s for now, f/u labs in am. No uremic symptoms at this time. Would move to [facility] in case HD needed. Will follow.  Renal transplant - done around 2010, cont prograf, myfortic and pred.  DM2 - on trulicity, insulin pump at home. Per pmd HTN - cont coreg and hydralazine, holding lasix Movement disorder - getting Graylon Gunning  MD 03/10/2022, 12:25 PM Recent Labs  Lab 03/09/22 1240 03/10/22 0401  HGB 7.0* 6.7*  ALBUMIN 3.6  --   CALCIUM 7.7* 7.7*  CREATININE 5.90* 5.78*  K 5.0 5.7*   Inpatient medications:  albuterol  2.5 mg Nebulization Q6H   atorvastatin  10 mg Oral Daily   carvedilol  12.5 mg Oral BID   Deutetrabenazine  6 mg Oral BID   heparin  5,000 Units Subcutaneous Q8H   insulin aspart  0-5 Units Subcutaneous QHS   insulin aspart  0-9 Units Subcutaneous TID WC   insulin glargine-yfgn  10 Units Subcutaneous Daily   levothyroxine  112 mcg Oral Q0600    lipase/protease/amylase  72,000 Units Oral TID WC   mycophenolate  180 mg Oral BID   predniSONE  5 mg Oral Q breakfast   senna  1 tablet Oral BID   sodium bicarbonate  650 mg Oral BID   sodium zirconium cyclosilicate  10 g Oral BID   tacrolimus  1.5 mg Oral BID    sodium chloride 50 mL/hr at 03/09/22 2301   [START ON 03/11/2022] levofloxacin (LEVAQUIN) IV     acetaminophen **OR** acetaminophen, albuterol, hydrALAZINE, ondansetron **OR** ondansetron (ZOFRAN) IV, polyethylene glycol

## 2022-03-10 NOTE — Progress Notes (Signed)
PT Cancellation Note  Patient Details Name: Nathan Tyler MRN: [id number] DOB: 1955-12-20   Cancelled Treatment:     PT order received but eval deferred - pt with Hgb 6.7 this am with transfusion ordered.  Will follow.   Claudio Mondry 03/10/2022, 7:59 AM

## 2022-03-10 NOTE — Progress Notes (Signed)
PROGRESS NOTE  [name]  DOB: August 14, 1955  PCP: Celene Squibb, MD QIW:[id number]  DOA: 03/09/2022  LOS: 1 day  Hospital Day: 2  Brief narrative: Nathan Tyler is a 66 y.o. male with PMH significant for renal transplantation 2008, chronically immunosuppressed, CKD 4 (baseline creatinine 3-4, follows up with Dr. Marval Regal), DM 2 on insulin pump, HTN, HLD, hypothyroidism, movement disorder Recently hospitalized 11/1-11/8 for community-acquired pneumonia, did not show any growth in cultures, completed course of antibiotics in the hospital and discharged to home with home health RN, PT.  Lives at home with his wife. For the first 2 days after discharge, patient feels good.  He then started to have progressive generalized weakness.  No fever.  No worsening of cough.  No burning urination. Today, patient went to urgent care for shortness of breath.  Noted to have O2 sat low at 80% on room air, given DuoNeb without significant improvement in his symptoms ED.  Initial vitals in the ED Afebrile, heart rate 54, respirate 20, blood pressure 130 479, O2 sat 93% on 4 L. VBG with pH 7.27, PCO2 57, bicarb 22 BMP with sodium 142, potassium 5, BUN/creatinine 96/5.9,  hemoglobin low at 7,  WBC count normal at 6.1 BNP elevated to 1591, troponin normal at 12 Chest x-ray showed bilateral patchy airspace opacities which may represent a combination of bronchovascular crowding, pulmonary edema, and possible multifocal infection. Patient was given 1 dose of IV Levaquin, breathing treatment Admitted to hospitalist service  Subjective: Patient was seen and examined this morning.  Pleasant elderly Caucasian male.  Propped up in bed.  On 5 L oxygen by nasal cannula.  Feels good.  Continues to have mild scattered bilateral wheezing.  Family not at bedside. Hemoglobin is morning was low at 6.9.  Monitor PRBC transfusion ordered.  Assessment/Plan: Acute respiratory failure with hypoxia Presented with progressive  shortness of breath without fever.  No evidence of volume overload. WC count and procalcitonin levels normal.  However has nonspecific bilateral infiltrates and oxygen dependence. Currently on empiric IV Levaquin. Elevated BNP.  Echocardiogram unimpressive with EF 55 to 41%, grade 2 diastolic dysfunction, mildly reduced RV systolic function Unclear etiology of respiratory failure. Currently on gentle IV hydration because of AKI Continue to wean down oxygen. Recent Labs  Lab 03/09/22 1240 03/09/22 1646 03/10/22 0401  WBC 6.1  --  4.8  PROCALCITON  --  0.18 0.10   Renal transplantation status 2008 Chronically immunosuppressed PTA on tacrolimus, mycophenolate, low-dose prednisone. Resume all.  AKI on CKD 4 Chronic metabolic acidosis baseline creatinine 3-4 Presented with creatinine elevated to 5.9.  Monitor on gentle IV fluids Nephrologist Dr. Marval Regal.   Nephrology consulted.   Creatinine slightly better this morning with IV hydration.  Recommended to continue gentle IV hydration for now.  Continue sodium bicarb 650 mg twice daily. Recent Labs    02/21/22 2048 02/22/22 0209 02/23/22 0219 02/24/22 1016 02/25/22 0316 02/26/22 0426 02/27/22 0936 02/28/22 0812 03/09/22 1240 03/10/22 0401  BUN 88* 84* 87* 71* 75* 63* 57* 58* 96* 100*  CREATININE 4.38* 4.30* 4.25* 3.69* 3.91* 3.46* 3.32* 3.28* 5.90* 5.78*  CO2 16* 16* 16* 16* 19* 20* 20* 21* 21* 19*    Acute on chronic anemia Hemoglobin close to 7 at baseline.  Probably due to CKD.   Hemoglobin down to 6.7 this morning without evidence of bleeding.  1 unit of PRBC transfused. Recent Labs    02/21/22 2119 02/22/22 0209 02/22/22 0829 02/23/22 1122 03/09/22 1240  03/10/22 0401  HGB 6.8* 7.5*  --  7.5* 7.0* 6.7*  MCV  --  97.7  --  96.9 100.8* 101.7*  VITAMINB12  --   --  277  --   --   --   FOLATE  --   --  11.6  --   --   --   FERRITIN  --   --  155  --   --   --   TIBC  --   --  260  --   --   --   IRON  --   --   33*  --   --   --   RETICCTPCT  --  3.2*  --   --   --   --    Type 2 diabetes mellitus with hypOglycemia A1c 5.7 on 22/0/2542 PTA on Trulicity weekly, insulin pump for bolus insulin.  Not on basal insulin. Because of AKI, probably insulin clearance is down and hence patient had an episode of hypoglycemia in the ED.  In the last 24 hours, blood sugar level improving and is higher than 200s. Insulin pump is off.  Started on Semglee 10 units from this morning.  Continue sliding scale insulin with Accu-Cheks. Recent Labs  Lab 03/09/22 1803 03/09/22 2232 03/10/22 0237 03/10/22 0756 03/10/22 1139  GLUCAP 97 139* 223* 232* 277*   Essential hypertension  PTA on Coreg 12.5 mg twice daily, Lasix 20 mg daily, hydralazine 50 mg 3 times daily, amlodipine 10 mg daily. Blood pressure in normal range.  Continue Coreg.  Others on hold.  HLD Continue Lipitor 10 mg daily  Hypothyroidism Continue Synthroid 112 mcg daily  Movement disorder PTA on [place] 6 mg twice daily.  Resume the same.  Mobility -Obtain PT eval  Goals of care - -  Code Status: DNR DNI.  Confirmed with patient and wife at bedside   Diet: start Cardiac/diabetic diet Diet Order             Diet renal/carb modified with fluid restriction Diet-HS Snack? Nothing; Fluid restriction: 1200 mL Fluid; Room service appropriate? Yes; Fluid consistency: Thin  Diet effective now                  DVT prophylaxis: Heparin subcu heparin injection 5,000 Units Start: 03/09/22 2200   Antimicrobials: None Fluid: None Consultants: Nephrology Family Communication: Wife at bedside  Status is: Inpatient  Continue in-hospital care because: Needs renal monitoring Level of care: Progressive   Dispo: The patient is from: Home              Anticipated d/c is to: Pending clinical course              Patient currently is not medically stable to d/c.   Difficult to place patient No     Infusions:   sodium chloride 60 mL/hr at  03/10/22 1317   [START ON 03/11/2022] levofloxacin (LEVAQUIN) IV      Scheduled Meds:  albuterol  2.5 mg Nebulization Q6H   atorvastatin  10 mg Oral Daily   carvedilol  12.5 mg Oral BID   Deutetrabenazine  6 mg Oral BID   heparin  5,000 Units Subcutaneous Q8H   insulin aspart  0-5 Units Subcutaneous QHS   insulin aspart  0-9 Units Subcutaneous TID WC   insulin glargine-yfgn  10 Units Subcutaneous Daily   levothyroxine  112 mcg Oral Q0600   lipase/protease/amylase  72,000 Units Oral TID WC   mycophenolate  180 mg Oral BID   predniSONE  5 mg Oral Q breakfast   senna  1 tablet Oral BID   sodium bicarbonate  650 mg Oral BID   sodium zirconium cyclosilicate  10 g Oral BID   tacrolimus  1.5 mg Oral BID    PRN meds: acetaminophen **OR** acetaminophen, albuterol, hydrALAZINE, ondansetron **OR** ondansetron (ZOFRAN) IV, polyethylene glycol   Antimicrobials: Anti-infectives (From admission, onward)    Start     Dose/Rate Route Frequency Ordered Stop   03/11/22 1600  levofloxacin (LEVAQUIN) IVPB 500 mg        500 mg 100 mL/hr over 60 Minutes Intravenous Every 48 hours 03/09/22 1808     03/09/22 1600  levofloxacin (LEVAQUIN) IVPB 500 mg        500 mg 100 mL/hr over 60 Minutes Intravenous  Once 03/09/22 1551 03/09/22 1730       Objective: Vitals:   03/10/22 1311 03/10/22 1352  BP: 134/75   Pulse: 65   Resp: (!) 23   Temp: 98.6 F (37 C)   SpO2:  93%    Intake/Output Summary (Last 24 hours) at 03/10/2022 1404 Last data filed at 03/10/2022 1311 Gross per 24 hour  Intake 1181.16 ml  Output 602 ml  Net 579.16 ml   Filed Weights   03/09/22 1231  Weight: 104 kg   Weight change:  Body mass index is 31.1 kg/m.   Physical Exam:   General exam: Pleasant, elderly Caucasian male.  Not in physical distress Skin: No rashes, lesions or ulcers. HEENT: Atraumatic, normocephalic, no obvious bleeding Lungs: Mild scattered bilateral wheezing.  No crackles CVS: Regular rate and  rhythm, no murmur GI/Abd soft, nontender, nondistended, bowels are present CNS: Alert, awake, oriented x3 Psychiatry: Mood appropriate Extremities: No pedal edema, no calf tenderness  Data Review: I have personally reviewed the laboratory data and studies available.  F/u labs ordered Unresulted Labs (From admission, onward)     Start     Ordered   03/11/22 0900  Tacrolimus level  Tomorrow morning,   R       Comments: Trough level please   Question:  Specimen collection method  Answer:  Lab=Lab collect   03/10/22 1244   03/10/22 1246  Creatinine, urine, random  Add-on,   AD        03/10/22 1245   03/10/22 1246  Urinalysis, Routine w reflex microscopic  Once,   R        03/10/22 1245   03/10/22 1246  Sodium, urine, random  Add-on,   AD        03/10/22 1245   03/10/22 0500  Procalcitonin  Daily at 5am,   R      03/09/22 1625   03/10/22 6761  Basic metabolic panel  Daily at 5am,   R      03/09/22 1837   03/10/22 0500  CBC  Daily at 5am,   R      03/09/22 1837   03/10/22 0500  Brain natriuretic peptide  Daily at 5am,   R      03/09/22 1837            Signed, Terrilee Croak, MD [facility] 03/10/2022

## 2022-03-11 ENCOUNTER — Inpatient Hospital Stay (HOSPITAL_COMMUNITY): Payer: Medicare Other

## 2022-03-11 DIAGNOSIS — N184 Chronic kidney disease, stage 4 (severe): Secondary | ICD-10-CM | POA: Diagnosis not present

## 2022-03-11 DIAGNOSIS — N179 Acute kidney failure, unspecified: Secondary | ICD-10-CM

## 2022-03-11 LAB — BLOOD GAS, ARTERIAL
Acid-base deficit: 6.3 mmol/L — ABNORMAL HIGH (ref 0.0–2.0)
Acid-base deficit: 5.7 mmol/L — ABNORMAL HIGH (ref 0.0–2.0)
Bicarbonate: 23.5 mmol/L (ref 20.0–28.0)
Bicarbonate: 24.8 mmol/L (ref 20.0–28.0)
O2 Saturation: 92.3 %
O2 Saturation: 98.9 %
Patient temperature: 36.4
Patient temperature: 37
pCO2 arterial: 64 mmHg — ABNORMAL HIGH (ref 32–48)
pCO2 arterial: 73 mmHg (ref 32–48)
pH, Arterial: 7.17 — CL (ref 7.35–7.45)
pH, Arterial: 7.14 — CL (ref 7.35–7.45)
pO2, Arterial: 56 mmHg — ABNORMAL LOW (ref 83–108)
pO2, Arterial: 169 mmHg — ABNORMAL HIGH (ref 83–108)

## 2022-03-11 LAB — BLOOD GAS, VENOUS
Acid-base deficit: 7.4 mmol/L — ABNORMAL HIGH (ref 0.0–2.0)
Bicarbonate: 21.6 mmol/L (ref 20.0–28.0)
O2 Saturation: 85.1 %
Patient temperature: 37
pCO2, Ven: 65 mmHg — ABNORMAL HIGH (ref 44–60)
pH, Ven: 7.13 — CL (ref 7.25–7.43)
pO2, Ven: 50 mmHg — ABNORMAL HIGH (ref 32–45)

## 2022-03-11 LAB — GLUCOSE, CAPILLARY
Glucose-Capillary: 140 mg/dL — ABNORMAL HIGH (ref 70–99)
Glucose-Capillary: 150 mg/dL — ABNORMAL HIGH (ref 70–99)
Glucose-Capillary: 151 mg/dL — ABNORMAL HIGH (ref 70–99)
Glucose-Capillary: 130 mg/dL — ABNORMAL HIGH (ref 70–99)

## 2022-03-11 LAB — CBC
HCT: 25.3 % — ABNORMAL LOW (ref 39.0–52.0)
Hemoglobin: 7.3 g/dL — ABNORMAL LOW (ref 13.0–17.0)
MCH: 29.4 pg (ref 26.0–34.0)
MCHC: 28.9 g/dL — ABNORMAL LOW (ref 30.0–36.0)
MCV: 102 fL — ABNORMAL HIGH (ref 80.0–100.0)
Platelets: 238 10*3/uL (ref 150–400)
RBC: 2.48 MIL/uL — ABNORMAL LOW (ref 4.22–5.81)
RDW: 15.2 % (ref 11.5–15.5)
WBC: 8.3 10*3/uL (ref 4.0–10.5)
nRBC: 0 % (ref 0.0–0.2)

## 2022-03-11 LAB — BASIC METABOLIC PANEL
Anion gap: 8 (ref 5–15)
BUN: 106 mg/dL — ABNORMAL HIGH (ref 8–23)
CO2: 22 mmol/L (ref 22–32)
Calcium: 7.8 mg/dL — ABNORMAL LOW (ref 8.9–10.3)
Chloride: 113 mmol/L — ABNORMAL HIGH (ref 98–111)
Creatinine, Ser: 5.89 mg/dL — ABNORMAL HIGH (ref 0.61–1.24)
GFR, Estimated: 10 mL/min — ABNORMAL LOW (ref >60)
Glucose, Bld: 153 mg/dL — ABNORMAL HIGH (ref 70–99)
Potassium: 5.7 mmol/L — ABNORMAL HIGH (ref 3.5–5.1)
Sodium: 143 mmol/L (ref 135–145)

## 2022-03-11 LAB — PROCALCITONIN: Procalcitonin: 0.11 ng/mL

## 2022-03-11 LAB — BRAIN NATRIURETIC PEPTIDE: B Natriuretic Peptide: 2114.8 pg/mL — ABNORMAL HIGH (ref 0.0–100.0)

## 2022-03-11 MED ORDER — FUROSEMIDE 10 MG/ML IJ SOLN: 80.0000 mg | Freq: Three times a day (TID) | INTRAMUSCULAR | Status: DC

## 2022-03-11 MED ORDER — FUROSEMIDE 10 MG/ML IJ SOLN
80.0000 mg | Freq: Two times a day (BID) | INTRAMUSCULAR | Status: DC
  Administered 2022-03-11: 80 mg via INTRAVENOUS
  Filled 2022-03-11: qty 8

## 2022-03-11 MED ORDER — DEXMEDETOMIDINE HCL IN NACL 200 MCG/50ML IV SOLN
0.2000 ug/kg/h | INTRAVENOUS | Status: DC
  Administered 2022-03-11 (×2): 0.5 ug/kg/h via INTRAVENOUS
  Administered 2022-03-11: 0.6 ug/kg/h via INTRAVENOUS
  Administered 2022-03-12: 0.3 ug/kg/h via INTRAVENOUS
  Administered 2022-03-13 (×2): 0.4 ug/kg/h via INTRAVENOUS
  Administered 2022-03-13: 0.6 ug/kg/h via INTRAVENOUS
  Administered 2022-03-14: 0.4 ug/kg/h via INTRAVENOUS
  Filled 2022-03-11 (×8): qty 50

## 2022-03-11 MED ORDER — MIDAZOLAM HCL 2 MG/2ML IJ SOLN
INTRAMUSCULAR | Status: AC
  Administered 2022-03-11: 2 mg
  Filled 2022-03-11: qty 2

## 2022-03-11 MED ORDER — SODIUM CHLORIDE 0.9 % IV SOLN
500.0000 [IU]/h | INTRAVENOUS | Status: DC
  Administered 2022-03-11 – 2022-03-13 (×3): 500 [IU]/h via INTRAVENOUS_CENTRAL
  Filled 2022-03-11 (×3): qty 10000

## 2022-03-11 MED ORDER — ALTEPLASE 2 MG IJ SOLR: 2.0000 mg | Freq: Once | INTRAMUSCULAR | Status: DC | PRN

## 2022-03-11 MED ORDER — HEPARIN SODIUM (PORCINE) 1000 UNIT/ML DIALYSIS
1000.0000 [IU] | INTRAMUSCULAR | Status: DC | PRN
  Administered 2022-03-13: 2400 [IU] via INTRAVENOUS_CENTRAL
  Filled 2022-03-11: qty 3
  Filled 2022-03-11: qty 6

## 2022-03-11 MED ORDER — SODIUM ZIRCONIUM CYCLOSILICATE 10 G PO PACK
10.0000 g | PACK | Freq: Two times a day (BID) | ORAL | Status: AC
  Administered 2022-03-11: 10 g via ORAL
  Filled 2022-03-11: qty 1

## 2022-03-11 MED ORDER — SODIUM CHLORIDE 0.9 % FOR CRRT: INTRAVENOUS_CENTRAL | Status: DC | PRN

## 2022-03-11 MED ORDER — PRISMASOL BGK 4/2.5 32-4-2.5 MEQ/L REPLACEMENT SOLN: Status: DC

## 2022-03-11 MED ORDER — MELATONIN 3 MG PO TABS
3.0000 mg | ORAL_TABLET | Freq: Every day | ORAL | Status: DC
  Administered 2022-03-12 – 2022-03-26 (×14): 3 mg via ORAL
  Filled 2022-03-11 (×15): qty 1

## 2022-03-11 MED ORDER — PRISMASOL BGK 0/2.5 32-2.5 MEQ/L EC SOLN: Status: DC

## 2022-03-11 MED ORDER — PRISMASOL BGK 4/2.5 32-4-2.5 MEQ/L EC SOLN: Status: DC

## 2022-03-11 MED ORDER — CHLORHEXIDINE GLUCONATE CLOTH 2 % EX PADS
6.0000 | MEDICATED_PAD | Freq: Every day | CUTANEOUS | Status: DC
  Administered 2022-03-11 – 2022-03-20 (×10): 6 via TOPICAL

## 2022-03-11 NOTE — Progress Notes (Addendum)
PROGRESS NOTE  Nathan Tyler  DOB: [birth year]  PCP: Celene Squibb, MD YBO:[id number]  DOA: 03/09/2022  LOS: 2 days  Hospital Day: 3  Brief narrative: Nathan Tyler is a 66 y.o. male with PMH significant for renal transplantation 2008, chronically immunosuppressed, CKD 4 (baseline creatinine 3-4, follows up with Dr. Marval Regal), DM 2 on insulin pump, HTN, HLD, hypothyroidism, movement disorder Recently hospitalized 11/1-11/8 for community-acquired pneumonia, did not show any growth in cultures, completed course of antibiotics in the hospital and discharged to home with home health RN, PT.  Lives at home with his wife. For the first 2 days after discharge, patient feels good.  He then started to have progressive generalized weakness.  No fever.  No worsening of cough.  No burning urination. Today, patient went to urgent care for shortness of breath.  Noted to have O2 sat low at 80% on room air, given DuoNeb without significant improvement in his symptoms ED.  Initial vitals in the ED Afebrile, heart rate 54, respirate 20, blood pressure 130 479, O2 sat 93% on 4 L. VBG with pH 7.27, PCO2 57, bicarb 22 BMP with sodium 142, potassium 5, BUN/creatinine 96/5.9,  hemoglobin low at 7,  WBC count normal at 6.1 BNP elevated to 1591, troponin normal at 12 Chest x-ray showed bilateral patchy airspace opacities which may represent a combination of bronchovascular crowding, pulmonary edema, and possible multifocal infection. Patient was given 1 dose of IV Levaquin, breathing treatment Admitted to hospitalist service  Subjective: Patient was seen and examined this morning.  Sitting up in recliner.  Not in distress.  Sister at bedside.   Could not sleep well last night.  On 5 L oxygen by cannula.   Had a urinary retention of about 700 mL in the bladder today. Labs from this morning with creatinine showing no improvement.  Assessment/Plan: Acute respiratory failure with hypoxia Presented with  progressive shortness of breath without fever.  No evidence of volume overload. WBC count and procalcitonin levels normal.  However has nonspecific bilateral infiltrates and oxygen dependence. Initially started on empiric IV Levaquin.  With no clear evidence of infection, I will stop antibiotics today. Elevated BNP.  Echocardiogram unimpressive with EF 55 to 27%, grade 2 diastolic dysfunction, mildly reduced RV systolic function Unclear etiology of respiratory failure. Currently on 5 L oxygen by nasal cannula continue to wean down oxygen. Recent Labs  Lab 03/09/22 1240 03/09/22 1646 03/10/22 0401 03/11/22 0244  WBC 6.1  --  4.8 8.3  PROCALCITON  --  0.18 0.10 0.11   Renal transplantation status 2008 Chronically immunosuppressed PTA on tacrolimus, mycophenolate, low-dose prednisone. Continue all.  AKI on CKD 4 Chronic metabolic acidosis baseline creatinine 3-4.  Follows up with nephrologist Dr. Marval Regal.   Presented with creatinine elevated to 5.9.  Creatinine remains elevated.  Noted plan for nephrology to move the patient to ICU area for possible initiation of CRRT.  Continue to monitor urine output. Continue sodium bicarb 650 mg twice daily. Recent Labs    02/22/22 0209 02/23/22 0219 02/24/22 1016 02/25/22 0316 02/26/22 0426 02/27/22 0936 02/28/22 0812 03/09/22 1240 03/10/22 0401 03/11/22 0244  BUN 84* 87* 71* 75* 63* 57* 58* 96* 100* 106*  CREATININE 4.30* 4.25* 3.69* 3.91* 3.46* 3.32* 3.28* 5.90* 5.78* 5.89*  CO2 16* 16* 16* 19* 20* 20* 21* 21* 19* 22    Acute on chronic anemia Hemoglobin close to 7 at baseline.  Probably due to CKD.   Hemoglobin down to 6.7 on 11/18  without evidence of bleeding.  1 unit of PRBC transfused with improvement in hemoglobin today to 7.3 Recent Labs    02/22/22 0209 02/22/22 0829 02/23/22 1122 03/09/22 1240 03/10/22 0401 03/11/22 0244  HGB 7.5*  --  7.5* 7.0* 6.7* 7.3*  MCV 97.7  --  96.9 100.8* 101.7* 102.0*  VITAMINB12  --   277  --   --   --   --   FOLATE  --  11.6  --   --   --   --   FERRITIN  --  155  --   --   --   --   TIBC  --  260  --   --   --   --   IRON  --  33*  --   --   --   --   RETICCTPCT 3.2*  --   --   --   --   --    Type 2 diabetes mellitus with hypOglycemia A1c 5.7 on 32/06/5571 PTA on Trulicity weekly, insulin pump for bolus insulin.  Not on basal insulin. Had episode of hypoglycemia in the ED probably because of reduced insulin clearance in the setting of AKI.  Insulin pump is off.  Continue Semglee 10 units, sliding scale insulin with Accu-Cheks. Recent Labs  Lab 03/10/22 1644 03/10/22 2131 03/11/22 0306 03/11/22 0745 03/11/22 1122  GLUCAP 257* 229* 140* 150* 151*   Essential hypertension  PTA on Coreg 12.5 mg twice daily, Lasix 20 mg daily, hydralazine 50 mg 3 times daily, amlodipine 10 mg daily. Blood pressure in normal range.  Continue Coreg.  Others on hold.  HLD Continue Lipitor 10 mg daily  Hypothyroidism Continue Synthroid 112 mcg daily  Movement disorder PTA on [place] 6 mg twice daily.  Continue the same.  Mobility PT following.  Goals of care - -  Code Status: DNR DNI.    Diet: start Cardiac/diabetic diet Diet Order             Diet renal/carb modified with fluid restriction Diet-HS Snack? Nothing; Fluid restriction: 1200 mL Fluid; Room service appropriate? Yes; Fluid consistency: Thin  Diet effective now                  DVT prophylaxis: Heparin subcu heparin injection 5,000 Units Start: 03/09/22 2200   Antimicrobials: None Fluid: None Consultants: Nephrology, PCCM Family Communication: Sister at bedside  Status is: Inpatient  Continue in-hospital care because: Renal function has not improved Level of care: ICU   Dispo: The patient is from: Home              Anticipated d/c is to: Pending clinical course              Patient currently is not medically stable to d/c.   Difficult to place patient No     Infusions:     Scheduled  Meds:  albuterol  2.5 mg Nebulization Q6H   atorvastatin  10 mg Oral Daily   carvedilol  12.5 mg Oral BID   Chlorhexidine Gluconate Cloth  6 each Topical Daily   Deutetrabenazine  6 mg Oral BID   furosemide  80 mg Intravenous Q12H   heparin  5,000 Units Subcutaneous Q8H   insulin aspart  0-5 Units Subcutaneous QHS   insulin aspart  0-9 Units Subcutaneous TID WC   insulin glargine-yfgn  10 Units Subcutaneous Daily   levothyroxine  112 mcg Oral Q0600   lipase/protease/amylase  72,000 Units Oral TID WC  melatonin  3 mg Oral QHS   mycophenolate  180 mg Oral BID   predniSONE  5 mg Oral Q breakfast   senna  1 tablet Oral BID   sodium bicarbonate  650 mg Oral BID   sodium zirconium cyclosilicate  10 g Oral BID   tacrolimus  1.5 mg Oral BID    PRN meds: acetaminophen **OR** acetaminophen, albuterol, hydrALAZINE, ondansetron **OR** ondansetron (ZOFRAN) IV, polyethylene glycol   Antimicrobials: Anti-infectives (From admission, onward)    Start     Dose/Rate Route Frequency Ordered Stop   03/11/22 1600  levofloxacin (LEVAQUIN) IVPB 500 mg  Status:  Discontinued        500 mg 100 mL/hr over 60 Minutes Intravenous Every 48 hours 03/09/22 1808 03/11/22 1539   03/09/22 1600  levofloxacin (LEVAQUIN) IVPB 500 mg        500 mg 100 mL/hr over 60 Minutes Intravenous  Once 03/09/22 1551 03/09/22 1730       Objective: Vitals:   03/11/22 1404 03/11/22 1428  BP: 139/66   Pulse: (!) 55   Resp: 20   Temp: 97.8 F (36.6 C)   SpO2: 95% 93%    Intake/Output Summary (Last 24 hours) at 03/11/2022 1545 Last data filed at 03/11/2022 1141 Gross per 24 hour  Intake 1562.4 ml  Output 720 ml  Net 842.4 ml   Filed Weights   03/09/22 1231  Weight: 104 kg   Weight change:  Body mass index is 31.1 kg/m.   Physical Exam:   General exam: Pleasant, elderly Caucasian male.  Not in physical distress Skin: No rashes, lesions or ulcers. HEENT: Atraumatic, normocephalic, no obvious  bleeding Lungs: Mild scattered bilateral wheezing.  No crackles CVS: Regular rate and rhythm, no murmur GI/Abd soft, nontender, nondistended, bowels are present CNS: Alert, awake, oriented x3.  Looks tired today because of impaired sleep last night Psychiatry: Mood appropriate Extremities: No pedal edema, no calf tenderness  Data Review: I have personally reviewed the laboratory data and studies available.  F/u labs ordered Unresulted Labs (From admission, onward)     Start     Ordered   03/11/22 1540  MRSA Next Gen by PCR, Nasal  Once,   R        03/11/22 1540   03/11/22 1533  Blood gas, venous  Once,   R       Question:  Specimen collection method  Answer:  Lab=Lab collect   03/11/22 1533   03/11/22 0900  Tacrolimus level  Tomorrow morning,   R       Comments: Trough level please   Question:  Specimen collection method  Answer:  Lab=Lab collect   03/10/22 1244   03/10/22 9924  Basic metabolic panel  Daily at 5am,   R      03/09/22 1837   03/10/22 0500  CBC  Daily at 5am,   R      03/09/22 1837   03/10/22 0500  Brain natriuretic peptide  Daily at 5am,   R      03/09/22 1837            Signed, Terrilee Croak, MD [facility] 03/11/2022

## 2022-03-11 NOTE — Progress Notes (Signed)
[facility] Progress Note  Subjective: 770 out yest, bladder scan this am 0. Pt confused today and creatinine no better, BUN > 100.   Vitals:   03/11/22 0521 03/11/22 0700 03/11/22 0850 03/11/22 0851  BP: 127/66     Pulse: (!) 56     Resp: 19     Temp: 98.3 F (36.8 C)     TempSrc: Oral     SpO2: 96% 97% (!) 83% 95%  Weight:      Height:        Exam: Gen is somnolent today, not waking up to voice No rash, cyanosis or gangrene Sclera anicteric, throat clear  No jvd or bruits Chest clear bilat to bases, no rales/ wheezing RRR no MRG Abd soft ntnd no mass or ascites +bs GU normal male MS no joint effusions or deformity Ext mild bilat LE edema, 1+ UE edema Neuro as above, has movement disorder (restlessness)      Home meds include - coreg 12.5 bid, lasix 20 mg mwf, austedo, synthorid, creon, myfortic 180 bid, prednisone 5 qd, tacrolimus 1.5 bid       Date                           Creat               eGFR   2017-2018                 1.11- 1.56   2020                          1.88   2021                          1.54- 2.09   Sept 2023                  3.56                 18 ml/min    11/1- 02/28/22           4.38 >> 3.28    AKI on CKD4, 14 >> 20 ml/min    11/17                                    5.90                 10     11/18                                    5.78                 10       UA negative    UNa <10, UCr 151    CXR - IMPRESSION: Very low lung volumes. Bilateral patchy airspace opacities may represent a combination of bronchovascular crowding, pulmonary edema, and possible multifocal infection.     Temp 992, wBC 4K  , BP 140/70, HR 60 -70, RR        Assessment/ Plan: AKI on CKD 4 transplant - b/l creatinine 3.3- 3.6, eGFR 18- 20 ml/min. Creat here 5.9 on admission 11/17 in the setting of SOB and recent admission for PNA rx'd w/  abx. UA neg, urine lytes c/w pre-renal. Renal US pending. Requiring O2 which is new.  Volume status unclear. CXR  is borderline. We tried cautious IVF's x 48hrs w/o improvement, and today pt is worse. Stopped IVF's and started IV lasix. Placed foley w/ 700cc out, so retention is a possible cause of his AKI. Pt is confused today, suspect uremia. Would move to ICU and will consult CCM. See how he responds to foley placement and IV lasix. May need CRRT if not waking up soon.  Renal transplant - done around 2010, cont prograf, myfortic and pred.  Renal US pending. Cont po meds.  DM2 - on trulicity, insulin pump at home. 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 Getting Graylon Gunning 03/11/2022, 11:36 AM   Recent Labs  Lab 03/09/22 1240 03/10/22 0401 03/11/22 0244  HGB 7.0* 6.7* 7.3*  ALBUMIN 3.6  --   --   CALCIUM 7.7* 7.7* 7.8*  CREATININE 5.90* 5.78* 5.89*  K 5.0 5.7* 5.7*   No results for input(s): "IRON", "TIBC", "FERRITIN" in the last 168 hours. Inpatient medications:  albuterol  2.5 mg Nebulization Q6H   atorvastatin  10 mg Oral Daily   carvedilol  12.5 mg Oral BID   Deutetrabenazine  6 mg Oral BID   furosemide  80 mg Intravenous Q8H   heparin  5,000 Units Subcutaneous Q8H   insulin aspart  0-5 Units Subcutaneous QHS   insulin aspart  0-9 Units Subcutaneous TID WC   insulin glargine-yfgn  10 Units Subcutaneous Daily   levothyroxine  112 mcg Oral Q0600   lipase/protease/amylase  72,000 Units Oral TID WC   mycophenolate  180 mg Oral BID   predniSONE  5 mg Oral Q breakfast   senna  1 tablet Oral BID   sodium bicarbonate  650 mg Oral BID   sodium zirconium cyclosilicate  10 g Oral BID   tacrolimus  1.5 mg Oral BID    sodium chloride 60 mL/hr at 03/11/22 1124   levofloxacin (LEVAQUIN) IV     acetaminophen **OR** acetaminophen, albuterol, hydrALAZINE, ondansetron **OR** ondansetron (ZOFRAN) IV, polyethylene glycol

## 2022-03-11 NOTE — Consult Note (Addendum)
NAME:  Nathan Tyler, MRN:  [id number], DOB:  [birth year], LOS: 2 ADMISSION DATE:  03/09/2022, CONSULTATION DATE:  03/11/22 REFERRING MD:  Pietro Cassis, CHIEF COMPLAINT:  confusion   History of Present Illness:  16yM with history of renal transplant 2008 on IS (prograf, MMF, prednisone), CKD4 follows with Dr. Marval Regal, DM2 on insulin pump, HTN, hypothyroid, movement disorder who was recently hospitalized 11/1 through 11/8 for CAP, discharged home with home health RN, PT. After a couple days at home develoiped progressive weakness, no fever or worsening of cough. Went to [facility] 11/17 for dyspnea and found ot have O2 saturation of 80% on RA. Given neb and then sent to ED where he was found to have acute on chronic hypercapnic respiratory failure vbg 7.27/57, AKI on CKD, elevated BNP. Admitted to hospitalist service and started on levaquin for possible recurrent CAP, gentle IVF for AKI. All home IS continued here.   PCCM consulted 11/19 for AKI on CKD, worsening acute metabolic encephalopathy and uremia. Plan for likely CRRT.  Pertinent  Medical History  Renal transplant 2008 on IS CKD4 HTN DM2 on insulin pump Hypothyroid Movement disorder  Significant Hospital Events: Including procedures, antibiotic start and stop dates in addition to other pertinent events   11/17 admitted and started on levaquin, IVF  11/19 transfer to ICU for CRRT  Interim History / Subjective:    Objective   Blood pressure 127/66, pulse (!) 56, temperature 98.3 F (36.8 C), temperature source Oral, resp. rate 19, height 6' (1.829 m), weight 104 kg, SpO2 95 %.    FiO2 (%):  [92 %] 92 %   Intake/Output Summary (Last 24 hours) at 03/11/2022 1144 Last data filed at 03/11/2022 1141 Gross per 24 hour  Intake 1877.4 ml  Output 870 ml  Net 1007.4 ml   Filed Weights   03/09/22 1231  Weight: 104 kg    Examination: General appearance: 66 y.o., male, NAD, conversant  Eyes:PERRL, tracking appropriately Neck: large Lungs:  upper airway wheeze not heard with stethoscope over chest, with mildly increased respiratory effort CV: brady RR, no murmur  Abdomen: Soft, non-tender; non-distended, BS present  Extremities: 2+ peripheral edema, warm Skin: Normal turgor and texture; no rash Neuro: inattentive, can follow commands   Resolved Hospital Problem list     Assessment & Plan:   Acute hypoxic respiratory failure Acute on chronic hypercapnic respiratory failure - Likely UF as tolerated as below - Seems to have upper airway wheeze and tracheomalacia and looks high risk for OSA, may benefit from CPAP at night/naps - I don't think he necessarily needs ABX but defer to primary team  Acute metabolic encephalopathy Uremia, levaquin may alternatively contribute - would stop levaquin, if primary team wishes to treat with ABX would use zosyn  AKI on CKD4 Hyperkalemia, mild Uremia Renal transplant 2008 - UOP picking up a bit after foley placement today but still anticipate likely needing to start CRRT  - will consent for HD catheter placement - nephro following - continue IS  Anemia of chronic disease - trend cbc  DM2  - basal/bolus - insulin pump off  Acute on chronic diastolic heart failure HTN - coreg, holding others - likely attempt to UF as tolerated with CRRT  Hypothyroid - home synthroid  Movement disorder  - home austedo  DNR status   Best Practice (right click and "Reselect all SmartList Selections" daily)   Diet/type: Regular consistency (see orders) DVT prophylaxis: prophylactic heparin  GI prophylaxis: N/A Lines: Dialysis Catheter Foley:  Yes, and it is still needed Code Status:  DNR Last date of multidisciplinary goals of care discussion [pt and sister updated at bedside]  Labs   CBC: Recent Labs  Lab 03/09/22 1240 03/10/22 0401 03/11/22 0244  WBC 6.1 4.8 8.3  HGB 7.0* 6.7* 7.3*  HCT 24.3* 23.5* 25.3*  MCV 100.8* 101.7* 102.0*  PLT 251 263 154    Basic Metabolic  Panel: Recent Labs  Lab 03/09/22 1240 03/10/22 0401 03/11/22 0244  NA 142 142 143  K 5.0 5.7* 5.7*  CL 112* 114* 113*  CO2 21* 19* 22  GLUCOSE 73 249* 153*  BUN 96* 100* 106*  CREATININE 5.90* 5.78* 5.89*  CALCIUM 7.7* 7.7* 7.8*   GFR: Estimated Creatinine Clearance: 15 mL/min (A) (by C-G formula based on SCr of 5.89 mg/dL (H)). Recent Labs  Lab 03/09/22 1240 03/09/22 1646 03/10/22 0401 03/11/22 0244  PROCALCITON  --  0.18 0.10 0.11  WBC 6.1  --  4.8 8.3    Liver Function Tests: Recent Labs  Lab 03/09/22 1240  AST 24  ALT 22  ALKPHOS 75  BILITOT 0.6  PROT 5.6*  ALBUMIN 3.6   No results for input(s): "LIPASE", "AMYLASE" in the last 168 hours. No results for input(s): "AMMONIA" in the last 168 hours.  ABG    Component Value Date/Time   HCO3 21.6 03/09/2022 1240   TCO2 16 (L) 02/21/2022 2119   ACIDBASEDEF 5.4 (H) 03/09/2022 1240   O2SAT 80.8 03/09/2022 1240     Coagulation Profile: No results for input(s): "INR", "PROTIME" in the last 168 hours.  Cardiac Enzymes: No results for input(s): "CKTOTAL", "CKMB", "CKMBINDEX", "TROPONINI" in the last 168 hours.  HbA1C: Hgb A1c MFr Bld  Date/Time Value Ref Range Status  02/22/2022 06:23 PM 5.7 (H) 4.8 - 5.6 % Final    Comment:    (NOTE) Pre diabetes:          5.7%-6.4%  Diabetes:              >6.4%  Glycemic control for   <7.0% adults with diabetes   05/31/2019 01:00 PM 8.5 (H) 4.8 - 5.6 % Final    Comment:    (NOTE) Pre diabetes:          5.7%-6.4% Diabetes:              >6.4% Glycemic control for   <7.0% adults with diabetes     CBG: Recent Labs  Lab 03/10/22 1644 03/10/22 2131 03/11/22 0306 03/11/22 0745 03/11/22 1122  GLUCAP 257* 229* 140* 150* 151*    Review of Systems:   Unable to obtain in setting of his acute metabolic encephalopathy  Past Medical History:  He,  has a past medical history of Chorea, Diabetes mellitus without complication ([place]), Essential hypertension,  Hypercholesteremia, Hypothyroidism, Neuromuscular disorder ([place]), PONV (postoperative nausea and vomiting), and Renal failure.   Surgical History:   Past Surgical History:  Procedure Laterality Date   AMPUTATION Right 06/27/2016   Procedure: AMPUTATION 5TH TOE RIGHT FOOT;  Surgeon: Caprice Beaver, DPM;  Location: [facility];  Service: Podiatry;  Laterality: Right;   AMPUTATION TOE Left 09/02/2015   Procedure: AMPUTATION  2ND TOE LEFT FOOT;  Surgeon: Aviva Signs, MD;  Location: [facility];  Service: General;  Laterality: Left;   BIOPSY  11/24/2021   Procedure: BIOPSY;  Surgeon: Daneil Dolin, MD;  Location: [facility];  Service: Endoscopy;;   CATARACT EXTRACTION Bilateral    COLONOSCOPY  01/2014   done  in [place]. patient reports h/o colon polyps on TCS in 2010, 2015 colonoscopy normal but advised to come back in 2020.    COLONOSCOPY WITH PROPOFOL N/A 01/19/2019   Procedure: COLONOSCOPY WITH PROPOFOL;  Surgeon: Daneil Dolin, MD;  Location: [facility];  Service: Endoscopy;  Laterality: N/A;  9:45am   COLONOSCOPY WITH PROPOFOL N/A 11/24/2021   Procedure: COLONOSCOPY WITH PROPOFOL;  Surgeon: Daneil Dolin, MD;  Location: [facility];  Service: Endoscopy;  Laterality: N/A;  2:15pm   KIDNEY TRANSPLANT Right 2010   POLYPECTOMY  01/19/2019   Procedure: POLYPECTOMY;  Surgeon: Daneil Dolin, MD;  Location: [facility];  Service: Endoscopy;;   POLYPECTOMY  11/24/2021   Procedure: POLYPECTOMY;  Surgeon: Daneil Dolin, MD;  Location: [facility];  Service: Endoscopy;;     Social History:   reports that he has never smoked. He has never been exposed to tobacco smoke. He has never used smokeless tobacco. He reports current alcohol use. He reports that he does not use drugs.   Family History:  His family history includes Colon cancer (age of onset: 8) in his maternal aunt.   Allergies No Known Allergies   Home Medications  Prior to Admission medications   Medication Sig Start Date End  Date Taking? Authorizing Provider  amLODipine (NORVASC) 10 MG tablet Take 10 mg by mouth daily. 12/28/21  Yes [provider]  atorvastatin (LIPITOR) 10 MG tablet Take 10 mg by mouth daily.   Yes [provider]  carvedilol (COREG) 12.5 MG tablet Take 12.5 mg by mouth 2 (two) times daily. 12/29/21  Yes [provider]  Deutetrabenazine (AUSTEDO) 6 MG TABS Take 6 mg by mouth 2 (two) times daily.   Yes [provider]  furosemide (LASIX) 20 MG tablet Take 20 mg by mouth daily.    Yes [provider]  hydrALAZINE (APRESOLINE) 50 MG tablet Take 50 mg by mouth 3 (three) times daily. 02/06/22  Yes [provider]  Insulin Disposable Pump (OMNIPOD DASH PODS, GEN 4,) MISC Inject 5 Units into the skin 3 (three) times daily. Medication: Novolog family member states usually runs about 5 units TID depending on his sliding scale 02/19/22  Yes [provider]  levothyroxine (SYNTHROID) 112 MCG tablet Take 112 mcg by mouth daily. 02/20/22  Yes [provider]  lipase/protease/amylase (CREON) 36000 UNITS CPEP capsule Take 2 capsules (72,000 Units total) by mouth 3 (three) times daily with meals. Take 1 with snacks 11/15/21  Yes Annitta Needs, NP  mycophenolate (MYFORTIC) 180 MG EC tablet Take 1 tablet (180 mg total) by mouth 2 (two) times daily. 02/28/22  Yes Charlynne Cousins, MD  predniSONE (DELTASONE) 5 MG tablet Take 1 tablet (5 mg total) by mouth daily with breakfast. restart '5mg'$  dose 3/10 after completing prednisone taper (2/24 - 3/9) 06/16/19  Yes Patrecia Pour, MD  sodium bicarbonate 650 MG tablet Take 650 mg by mouth 2 (two) times daily. 11/18/21  Yes [provider]  tacrolimus (PROGRAF) 0.5 MG capsule Take 1.5 mg by mouth 2 (two) times daily.    Yes [provider]  Dulaglutide (TRULICITY) 1.5 PH/1.5AV SOPN Inject 1.5 mg into the skin every 7 (seven) days.     [provider]  oxyCODONE (ROXICODONE) 5 MG immediate  release tablet Take 1 tablet (5 mg total) by mouth every 6 (six) hours as needed for up to 12 doses for severe pain. Patient not taking: Reported on 03/09/2022 12/21/21  Fransico Meadow, MD     Critical care time: 40 minutes

## 2022-03-11 NOTE — Progress Notes (Addendum)
[facility] Progress Note Patient Name: Nathan Tyler DOB: [birth year] MRN: [id number]   Date of Service  03/11/2022  HPI/Events of Note  asking for ABG order, pt lethargic and on precedex for agiation but only 0.5 but received versed for HD placement.  Last ABG from 4:44 PM  7.13/65/50.   Camera: Discussed with RN. On nasal o2. Obese. Getting CRRT, precedex gtt, sinus brady, MAPgood.  Acute on chronic type 2 failure , Encephalopathy AKI on CKD.  eICU Interventions  To go on BiPAP 01/26/15. ABG  ordered, to do on BiPAP Asp precautions     Intervention Category Intermediate Interventions: Respiratory distress - evaluation and management  Elmer Sow 03/11/2022, 9:40 PM  22;23  just given and asking to order as PO    Pt has only 1 PIV   ABG reviewed: 7.17/64. hco3 at 23  DNR  22:32 Hold oral meds while on BiPAP, encephalopathic.discussed with RN.  23:34 ABG on current biPAP  worsening Type 2 failure.  - will go up on IPAP from 10 to 18. Rate to 20.  Follow ABG.   00:00 Hold lasix while on CRRT

## 2022-03-11 NOTE — Progress Notes (Signed)
Pharmacy Note    Pharmacy is consulted to review meds for appropriate use in CRRT as pt is due to start CRRT today.    No medications need adjustment at the current time. Of note, Mycophenolate undergoes insignificant clearance in CRRT. Tarcolimus does not requite dosage adjustments in CRRT.     Royetta Asal, PharmD, BCPS 03/11/2022 5:10 PM

## 2022-03-11 NOTE — Progress Notes (Addendum)
Pt moved to ICU, awaiting line placement. Will plan for CRRT for volume and solute control.  Breathing worse, is on bipap now. Max UF w/ CRRT as tolerated.   [name] Splinter, MD 03/11/2022, 4:31 PM

## 2022-03-11 NOTE — Progress Notes (Signed)
MD ordered BiPAP PRN but would like to place central line first. RN aware and will contact RT when ready for application. BiPAP equipment is in room at this time.

## 2022-03-11 NOTE — Procedures (Addendum)
Central Venous Catheter Insertion Procedure Note  Nathan Tyler  [id number]  [birth year]  Date:03/11/22  Time:4:44 PM   Provider Performing:Nathan Tyler   Procedure: Insertion of Non-tunneled Central Venous Catheter(36556)with US guidance ([id number])    Indication(s) Medication administration and Hemodialysis  Consent Risks of the procedure as well as the alternatives and risks of each were explained to the patient and/or caregiver.  Consent for the procedure was obtained and is signed in the bedside chart  Anesthesia Topical only with 1% lidocaine  1 mg versed IV  Timeout Verified patient identification, verified procedure, site/side was marked, verified correct patient position, special equipment/implants available, medications/allergies/relevant history reviewed, required imaging and test results available.  Sterile Technique Maximal sterile technique including full sterile barrier drape, hand hygiene, sterile gown, sterile gloves, mask, hair covering, sterile ultrasound probe cover (if used).  Procedure Description Area of catheter insertion was cleaned with chlorhexidine and draped in sterile fashion.   With real-time ultrasound guidance a HD catheter was placed into the right internal jugular vein.  Nonpulsatile blood flow and easy flushing noted in all ports.  The catheter was sutured in place and sterile dressing applied.    Complications/Tolerance None; patient tolerated the procedure well. Chest X-ray is ordered to verify placement for internal jugular or subclavian cannulation.  Chest x-ray is not ordered for femoral cannulation.  EBL Minimal  Specimen(s) None

## 2022-03-11 NOTE — Progress Notes (Signed)
PT Cancellation Note  Patient Details Name: Nathan Tyler MRN: [id number] DOB: [birth year]   Cancelled Treatment:    Reason Eval/Treat Not Completed: Patient not medically ready; just transferred to ICU, possible CRRT per Nephro/CCM   [facility] 03/11/2022, 3:09 PM

## 2022-03-12 ENCOUNTER — Inpatient Hospital Stay (HOSPITAL_COMMUNITY): Payer: Medicare Other

## 2022-03-12 DIAGNOSIS — I5033 Acute on chronic diastolic (congestive) heart failure: Secondary | ICD-10-CM | POA: Diagnosis not present

## 2022-03-12 DIAGNOSIS — N184 Chronic kidney disease, stage 4 (severe): Secondary | ICD-10-CM | POA: Diagnosis not present

## 2022-03-12 DIAGNOSIS — E440 Moderate protein-calorie malnutrition: Secondary | ICD-10-CM | POA: Insufficient documentation

## 2022-03-12 DIAGNOSIS — J9601 Acute respiratory failure with hypoxia: Secondary | ICD-10-CM | POA: Diagnosis not present

## 2022-03-12 DIAGNOSIS — N179 Acute kidney failure, unspecified: Secondary | ICD-10-CM | POA: Diagnosis not present

## 2022-03-12 LAB — RENAL FUNCTION PANEL
Albumin: 3.5 g/dL (ref 3.5–5.0)
Albumin: 3.2 g/dL — ABNORMAL LOW (ref 3.5–5.0)
Anion gap: 8 (ref 5–15)
Anion gap: 8 (ref 5–15)
BUN: 90 mg/dL — ABNORMAL HIGH (ref 8–23)
BUN: 61 mg/dL — ABNORMAL HIGH (ref 8–23)
CO2: 23 mmol/L (ref 22–32)
CO2: 23 mmol/L (ref 22–32)
Calcium: 7.5 mg/dL — ABNORMAL LOW (ref 8.9–10.3)
Calcium: 7.5 mg/dL — ABNORMAL LOW (ref 8.9–10.3)
Chloride: 111 mmol/L (ref 98–111)
Chloride: 108 mmol/L (ref 98–111)
Creatinine, Ser: 4.32 mg/dL — ABNORMAL HIGH (ref 0.61–1.24)
Creatinine, Ser: 2.88 mg/dL — ABNORMAL HIGH (ref 0.61–1.24)
GFR, Estimated: 14 mL/min — ABNORMAL LOW (ref >60)
GFR, Estimated: 23 mL/min — ABNORMAL LOW (ref >60)
Glucose, Bld: 120 mg/dL — ABNORMAL HIGH (ref 70–99)
Glucose, Bld: 140 mg/dL — ABNORMAL HIGH (ref 70–99)
Phosphorus: 7.1 mg/dL — ABNORMAL HIGH (ref 2.5–4.6)
Phosphorus: 3.9 mg/dL (ref 2.5–4.6)
Potassium: 4.8 mmol/L (ref 3.5–5.1)
Potassium: 4 mmol/L (ref 3.5–5.1)
Sodium: 142 mmol/L (ref 135–145)
Sodium: 139 mmol/L (ref 135–145)

## 2022-03-12 LAB — GLUCOSE, CAPILLARY
Glucose-Capillary: 113 mg/dL — ABNORMAL HIGH (ref 70–99)
Glucose-Capillary: 115 mg/dL — ABNORMAL HIGH (ref 70–99)
Glucose-Capillary: 100 mg/dL — ABNORMAL HIGH (ref 70–99)
Glucose-Capillary: 84 mg/dL (ref 70–99)
Glucose-Capillary: 130 mg/dL — ABNORMAL HIGH (ref 70–99)
Glucose-Capillary: 143 mg/dL — ABNORMAL HIGH (ref 70–99)

## 2022-03-12 LAB — BLOOD GAS, ARTERIAL
Acid-base deficit: 5.5 mmol/L — ABNORMAL HIGH (ref 0.0–2.0)
Acid-base deficit: 1.8 mmol/L (ref 0.0–2.0)
Bicarbonate: 23.7 mmol/L (ref 20.0–28.0)
Bicarbonate: 26 mmol/L (ref 20.0–28.0)
O2 Saturation: 98.9 %
O2 Saturation: 94.2 %
Patient temperature: 36.4
Patient temperature: 36.6
pCO2 arterial: 60 mmHg — ABNORMAL HIGH (ref 32–48)
pCO2 arterial: 61 mmHg — ABNORMAL HIGH (ref 32–48)
pH, Arterial: 7.2 — ABNORMAL LOW (ref 7.35–7.45)
pH, Arterial: 7.24 — ABNORMAL LOW (ref 7.35–7.45)
pO2, Arterial: 105 mmHg (ref 83–108)
pO2, Arterial: 58 mmHg — ABNORMAL LOW (ref 83–108)

## 2022-03-12 LAB — BLOOD GAS, VENOUS
Acid-base deficit: 3.8 mmol/L — ABNORMAL HIGH (ref 0.0–2.0)
Bicarbonate: 24.2 mmol/L (ref 20.0–28.0)
O2 Saturation: 91.6 %
Patient temperature: 36.4
pCO2, Ven: 60 mmHg (ref 44–60)
pH, Ven: 7.21 — ABNORMAL LOW (ref 7.25–7.43)
pO2, Ven: 56 mmHg — ABNORMAL HIGH (ref 32–45)

## 2022-03-12 LAB — BPAM RBC
Blood Product Expiration Date: 202312172359
ISSUE DATE / TIME: 202311180930
Unit Type and Rh: 5100

## 2022-03-12 LAB — TYPE AND SCREEN
ABO/RH(D): O POS
Antibody Screen: NEGATIVE
Unit division: 0

## 2022-03-12 LAB — APTT: aPTT: 24 seconds (ref 24–36)

## 2022-03-12 LAB — CBC
HCT: 27.5 % — ABNORMAL LOW (ref 39.0–52.0)
Hemoglobin: 8 g/dL — ABNORMAL LOW (ref 13.0–17.0)
MCH: 29.7 pg (ref 26.0–34.0)
MCHC: 29.1 g/dL — ABNORMAL LOW (ref 30.0–36.0)
MCV: 102.2 fL — ABNORMAL HIGH (ref 80.0–100.0)
Platelets: 222 10*3/uL (ref 150–400)
RBC: 2.69 MIL/uL — ABNORMAL LOW (ref 4.22–5.81)
RDW: 15.1 % (ref 11.5–15.5)
WBC: 7.8 10*3/uL (ref 4.0–10.5)
nRBC: 0.3 % — ABNORMAL HIGH (ref 0.0–0.2)

## 2022-03-12 LAB — MAGNESIUM: Magnesium: 2.7 mg/dL — ABNORMAL HIGH (ref 1.7–2.4)

## 2022-03-12 LAB — BRAIN NATRIURETIC PEPTIDE: B Natriuretic Peptide: 650.2 pg/mL — ABNORMAL HIGH (ref 0.0–100.0)

## 2022-03-12 MED ORDER — ORAL CARE MOUTH RINSE: 15.0000 mL | OROMUCOSAL | Status: DC | PRN

## 2022-03-12 MED ORDER — HYDRALAZINE HCL 50 MG PO TABS
50.0000 mg | ORAL_TABLET | Freq: Three times a day (TID) | ORAL | Status: DC
  Administered 2022-03-12 – 2022-03-27 (×45): 50 mg via ORAL
  Filled 2022-03-12 (×45): qty 1

## 2022-03-12 MED ORDER — RENA-VITE PO TABS
1.0000 | ORAL_TABLET | Freq: Every day | ORAL | Status: DC
  Administered 2022-03-12 – 2022-03-26 (×15): 1 via ORAL
  Filled 2022-03-12 (×15): qty 1

## 2022-03-12 MED ORDER — AMLODIPINE BESYLATE 10 MG PO TABS
10.0000 mg | ORAL_TABLET | Freq: Every day | ORAL | Status: DC
  Administered 2022-03-12 – 2022-03-27 (×16): 10 mg via ORAL
  Filled 2022-03-12 (×17): qty 1

## 2022-03-12 MED ORDER — ENSURE ENLIVE PO LIQD
237.0000 mL | Freq: Two times a day (BID) | ORAL | Status: DC
  Administered 2022-03-13 – 2022-03-20 (×10): 237 mL via ORAL
  Filled 2022-03-12 (×2): qty 237

## 2022-03-12 MED ORDER — PRISMASOL BGK 4/2.5 32-4-2.5 MEQ/L EC SOLN
Status: DC
  Filled 2022-03-12 (×13): qty 5000

## 2022-03-12 MED ORDER — ORAL CARE MOUTH RINSE
15.0000 mL | OROMUCOSAL | Status: DC
  Administered 2022-03-12 – 2022-03-16 (×16): 15 mL via OROMUCOSAL

## 2022-03-12 NOTE — Progress Notes (Signed)
Briefly seen and examined this morning. Patient was transferred to ICU yesterday for CRRT. On CRRT this morning.  Also on BiPAP for hypercapnia. Sister at bedside. Discussed with PCCM attending Dr. Loanne Drilling. PCCM will be the primary. TRH will sign off No TRH bill for today.

## 2022-03-12 NOTE — Progress Notes (Signed)
Initial Nutrition Assessment  DOCUMENTATION CODES:   Non-severe (moderate) malnutrition in context of chronic illness  INTERVENTION:  - Liberalize diet from Renal/Carb Modified to Regular as patient with increased needs while on CRRT and HA1C improved to 5.7 - Ensure Enlive po BID, each supplement provides 350 kcal and 20 grams of protein. - Daily Rena-vit to support micronutrient needs - Continue Creon as medically appropriate  NUTRITION DIAGNOSIS:   Moderate Malnutrition related to chronic illness (renal transplant 2008 with CKD 4 on chonic immunosuppression) as evidenced by mild muscle depletion, mild fat depletion.  GOAL:   Patient will meet greater than or equal to 90% of their needs  MONITOR:   Weight trends, Supplement acceptance, PO intake, Labs   REASON FOR ASSESSMENT:   Malnutrition Screening Tool    ASSESSMENT:   67 y.o. male with PMH of renal transplantation 2008, chronically immunosuppressed, CKD 4, DM 2 on insulin pump, HTN, HLD, hypothyroidism, movement disorder  Recently hospitalized 11/1-11/8 for community-acquired pneumonia who presented with SOB.   Patient sitting in bed at time of visit, on CRRT.  Patient reports UBW of 250# and slow and intentional weight loss over the past 2 months due to healthier eating but notes over the past 20 days he has lost 20# unintentionally due to being sick.  Per chart review of weight history: 8/4 - 235# 11/2 - 229# 11/17 - 229# 11/20 - 259# Patient's most recent weight from today noted to be elevated from previous recent weights. Suspect elevated weight is due to fluid as patient noted to be experiencing edema in RLE and LLE.  Patient reports he typically eats breakfast and dinner every day. Wife cooks at home for patient and notes she has been cooking healthier for him, with lower carb meals. He notes his appetite is typically very good but over the past 2 months has been steadily declining and that is is currently very  poor. However, he notes he has lunch of a [place] burger on the way and is excited for it to arrive. Discussed increased calorie and protein needs while on CRRT and importance of eating well with 3 meals a day. Patient agreeable to try nutrition supplements to support intake.   Medications reviewed and include: Insulin, Synthroid, Creon (72000 units 3x/day with meals), Senokot, Prograf  Labs reviewed:  Creatinine 4.32 Phosphorus 7.1 HA1C 5.7   NUTRITION - FOCUSED PHYSICAL EXAM:  Flowsheet Row Most Recent Value  Orbital Region Moderate depletion  Upper Arm Region Mild depletion  Thoracic and Lumbar Region Mild depletion  Buccal Region Mild depletion  Temple Region Severe depletion  Clavicle Bone Region Mild depletion  Clavicle and Acromion Bone Region Mild depletion  Scapular Bone Region Unable to assess  Dorsal Hand No depletion  Patellar Region Mild depletion  Anterior Thigh Region Mild depletion  Posterior Calf Region Mild depletion  Edema (RD Assessment) Mild  Hair Reviewed  Eyes Reviewed  Mouth Reviewed  Skin Reviewed  Nails Reviewed       Diet Order:   Diet Order             Diet renal/carb modified with fluid restriction Diet-HS Snack? Nothing; Fluid restriction: 1200 mL Fluid; Room service appropriate? Yes; Fluid consistency: Thin  Diet effective now                   EDUCATION NEEDS:  No education needs have been identified at this time  Skin:  Skin Assessment: Reviewed RN Assessment  Last BM:  11/18  Height:  Ht Readings from Last 1 Encounters:  03/09/22 6' (1.829 m)   Weight:  Wt Readings:  03/09/22 104 kg - used for calculations, more consistent w/ previous wt hx  03/12/22 117.9    BMI:  Body mass index is 31.09 kg/m.  Estimated Nutritional Needs:  Kcal:  2600-2900 kcal Protein:  145-165 grams Fluid:  >/=2.Vanderbilt RD, LDN For contact information, refer to [facility].

## 2022-03-12 NOTE — Progress Notes (Signed)
PT Cancellation Note  Patient Details Name: Nathan Tyler MRN: [id number] DOB: Aug 25, 1955   Cancelled Treatment:    Reason Eval/Treat Not Completed: Patient not medically ready Tresa Endo [place] Office [phone] Weekend pager-[phone]   Claretha Cooper 03/12/2022, 8:21 AM

## 2022-03-12 NOTE — Progress Notes (Addendum)
Nephrology MD Joelyn Oms at bedside. Verbal order to increase UF to 150cc/hr-pending orders to be placed.

## 2022-03-12 NOTE — Progress Notes (Signed)
Nathan Tyler Progress Note  Subjective:  Seen in room, on CRRT Pt awake, jovial Good BPs Tolerating UF 0K dialysate, 4K pre/post; AM K 4.8; P 7.1 Using Temp HD cath   Vitals:   03/12/22 1000 03/12/22 1119 03/12/22 1200 03/12/22 1300  BP: (!) 160/63  (!) 167/61 (!) 149/40  Pulse: 85 (!) 57 61 (!) 37  Resp: _0 (!) 23  Temp:      TempSrc:      SpO2: 100% 98% 95% 99%  Weight:      Height:        Exam: Gen is very awake today No rash, cyanosis or gangrene Sclera anicteric, throat clear  No jvd or bruits Chest clear bilat to bases, no rales/ wheezing RRR no MRG Abd soft ntnd no mass or ascites +bs GU normal male MS no joint effusions or deformity Ext mild bilat LE edema, 1+ UE edema Neuro as above, has movement disorder (restlessness)      Home meds include - coreg 12.5 bid, lasix 20 mg mwf, austedo, synthorid, creon, myfortic 180 bid, prednisone 5 qd, tacrolimus 1.5 bid       Date                           Creat               eGFR   2017-2018                 1.11- 1.56   2020                          1.88   2021                          1.54- 2.09   Sept 2023                  3.56                 18 ml/min    11/1- 02/28/22           4.38 >> 3.28    AKI on CKD4, 14 >> 20 ml/min    11/17                                    5.90                 10     11/18                                    5.78                 10       UA negative    UNa <10, UCr 151    CXR - IMPRESSION: Very low lung volumes. Bilateral patchy airspace opacities may represent a combination of bronchovascular crowding, pulmonary edema, and possible multifocal infection.     Temp 992, wBC 4K  , BP 140/70, HR 60 -70, RR        Assessment/ Plan: AKI on CKD 4 transplant - Started CRRT 03/11/22:  b/l creatinine 3.3- 3.6, eGFR 18- 20 ml/min.  Creat here 5.9 on admission 11/17 in the  setting of SOB and recent admission for PNA rx'd w/ abx. Likely ATN in setting of advanced CKD UA  neg, urine lytes c/w pre-renal.  Renal US needed, ordered Change to all 4K fluids K is now 4.8 Cont to remove fluid up to 117m/h for another 24h and then look to stop and assess if needed iHD Renal transplant - done around 2010, cont prograf, myfortic and pred.  [place]. Cont po meds.  DM2 - on trulicity, insulin pump at home. 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 Getting AHillery [name]  03/12/2022, 1:56 PM   Recent Labs  Lab 03/09/22 1240 03/10/22 0401 03/11/22 0244 03/12/22 0301  HGB 7.0*   < > 7.3* 8.0*  ALBUMIN 3.6  --   --  3.5  CALCIUM 7.7*   < > 7.8* 7.5*  PHOS  --   --   --  7.1*  CREATININE 5.90*   < > 5.89* 4.32*  K 5.0   < > 5.7* 4.8   < > = values in this interval not displayed.    No results for input(s): "IRON", "TIBC", "FERRITIN" in the last 168 hours. Inpatient medications:  albuterol  2.5 mg Nebulization Q6H   atorvastatin  10 mg Oral Daily   Chlorhexidine Gluconate Cloth  6 each Topical Daily   heparin  5,000 Units Subcutaneous Q8H   insulin aspart  0-5 Units Subcutaneous QHS   insulin aspart  0-9 Units Subcutaneous TID WC   insulin glargine-yfgn  10 Units Subcutaneous Daily   levothyroxine  112 mcg Oral Q0600   lipase/protease/amylase  72,000 Units Oral TID WC   melatonin  3 mg Oral QHS   mycophenolate  180 mg Oral BID   mouth rinse  15 mL Mouth Rinse 4 times per day   predniSONE  5 mg Oral Q breakfast   senna  1 tablet Oral BID   tacrolimus  1.5 mg Oral BID     prismasol BGK 4/2.5 400 mL/hr at 03/12/22 0659    prismasol BGK 4/2.5 400 mL/hr at 03/12/22 0726   dexmedetomidine (PRECEDEX) IV infusion Stopped (03/12/22 1056)   heparin 10,000 units/ 20 mL infusion syringe 500 Units/hr (03/12/22 1123)   prismasol BGK 0/2.5 1,500 mL/hr at 03/12/22 1125   acetaminophen **OR** acetaminophen, albuterol, alteplase,  heparin, hydrALAZINE, ondansetron **OR** ondansetron (ZOFRAN) IV, mouth rinse, polyethylene glycol, sodium chloride

## 2022-03-12 NOTE — Consult Note (Signed)
NAME:  Nathan Tyler, MRN:  [id number], DOB:  12/23/1955, LOS: 3 ADMISSION DATE:  03/09/2022, CONSULTATION DATE:  03/11/22 REFERRING MD:  Pietro Cassis, CHIEF COMPLAINT:  confusion   History of Present Illness:  31yM with history of renal transplant 2008 on IS (prograf, MMF, prednisone), CKD4 follows with Dr. Marval Regal, DM2 on insulin pump, HTN, hypothyroid, movement disorder who was recently hospitalized 11/1 through 11/8 for CAP, discharged home with home health RN, PT. After a couple days at home develoiped progressive weakness, no fever or worsening of cough. Went to [facility] 11/17 for dyspnea and found ot have O2 saturation of 80% on RA. Given neb and then sent to ED where he was found to have acute on chronic hypercapnic respiratory failure vbg 7.27/57, AKI on CKD, elevated BNP. Admitted to hospitalist service and started on levaquin for possible recurrent CAP, gentle IVF for AKI. All home IS continued here.   PCCM consulted 11/19 for AKI on CKD, worsening acute metabolic encephalopathy and uremia. Plan for likely CRRT.  Pertinent  Medical History  Renal transplant 2008 on IS CKD4 HTN DM2 on insulin pump Hypothyroid Movement disorder  Significant Hospital Events: Including procedures, antibiotic start and stop dates in addition to other pertinent events   11/17 admitted and started on levaquin, IVF  11/19 transfer to ICU for CRRT  Interim History / Subjective:  This morning on BiPAP Awakens with tactile stimulation Tolerating CRRT  Objective   Blood pressure 138/66, pulse (!) 54, temperature (!) 97.1 F (36.2 C), temperature source Axillary, resp. rate 13, height 6' (1.829 m), weight 117.9 kg, SpO2 100 %.    FiO2 (%):  [30 %-90 %] 80 %   Intake/Output Summary (Last 24 hours) at 03/12/2022 0742 Last data filed at 03/12/2022 0700 Gross per 24 hour  Intake 171.24 ml  Output 2033 ml  Net -1861.76 ml   Filed Weights   03/09/22 1231 03/12/22 0457  Weight: 104 kg 117.9 kg   Physical  Exam: General: Chronically ill-appearing, no acute distress HENT: [place], AT, BiPAP in place Eyes: EOMI, no scleral icterus Respiratory: Diminished but clear to auscultation bilaterally.  No crackles, wheezing or rales Cardiovascular: RRR, -M/R/G, no JVD GI: BS+, distended but soft, nontender Extremities: 2+ pitting edema in lower extremities bilaterally,-tenderness Neuro: AAO x4, CNII-XII grossly intact GU: Foley in place  CXR 03/11/22 - Bibasilar pulmonary edema, small pleural effusions. R IJ in place Resolved Hospital Problem list   Hyperkalemia, mild  Assessment & Plan:   Acute hypoxic respiratory failure 2/2 pulmonary edema/pleural effusions Acute on chronic hypercapnic respiratory failure Acute on chronic diastolic heart failure Low suspicion for infection. Antibiotics discontinued after admission day. - BiPAP for support - UF as tolerated as below - Repeat ABG this AM  Acute metabolic encephalopathy Movement disorder  - Holding home Deutetrabenazine. Restart when able - Minimize sedating meds - Re-orient   AKI on CKD4 Uremia - improving Hyperphosphatemia Renal transplant 2008 - CRRT per Nephrology. Hold home sodium bicarb  - Goal net negative UF - Continue myfortic, prograf and prednisone 5 mg daily  Anemia of chronic disease - trend cbc  DM2  - basal/bolus - insulin pump off  Acute on chronic diastolic heart failure HTN - coreg, holding others - likely attempt to UF as tolerated with CRRT  Hypothyroid - home synthroid  DNR status   Best Practice (right click and "Reselect all SmartList Selections" daily)   Diet/type: Regular consistency (see orders) DVT prophylaxis: prophylactic heparin  GI prophylaxis: N/A Lines:  Dialysis Catheter Foley:  Yes, and it is still needed Code Status:  DNR Last date of multidisciplinary goals of care discussion [pt and sister updated at bedside]  Home Medications  Prior to Admission medications   Medication Sig  Start Date End Date Taking? Authorizing Provider  amLODipine (NORVASC) 10 MG tablet Take 10 mg by mouth daily. 12/28/21  Yes [provider]  atorvastatin (LIPITOR) 10 MG tablet Take 10 mg by mouth daily.   Yes [provider]  carvedilol (COREG) 12.5 MG tablet Take 12.5 mg by mouth 2 (two) times daily. 12/29/21  Yes [provider]  Deutetrabenazine (AUSTEDO) 6 MG TABS Take 6 mg by mouth 2 (two) times daily.   Yes [provider]  furosemide (LASIX) 20 MG tablet Take 20 mg by mouth daily.    Yes [provider]  hydrALAZINE (APRESOLINE) 50 MG tablet Take 50 mg by mouth 3 (three) times daily. 02/06/22  Yes [provider]  Insulin Disposable Pump (OMNIPOD DASH PODS, GEN 4,) MISC Inject 5 Units into the skin 3 (three) times daily. Medication: Novolog family member states usually runs about 5 units TID depending on his sliding scale 02/19/22  Yes [provider]  levothyroxine (SYNTHROID) 112 MCG tablet Take 112 mcg by mouth daily. 02/20/22  Yes [provider]  lipase/protease/amylase (CREON) 36000 UNITS CPEP capsule Take 2 capsules (72,000 Units total) by mouth 3 (three) times daily with meals. Take 1 with snacks 11/15/21  Yes Annitta Needs, NP  mycophenolate (MYFORTIC) 180 MG EC tablet Take 1 tablet (180 mg total) by mouth 2 (two) times daily. 02/28/22  Yes Charlynne Cousins, MD  predniSONE (DELTASONE) 5 MG tablet Take 1 tablet (5 mg total) by mouth daily with breakfast. restart '5mg'$  dose 3/10 after completing prednisone taper (2/24 - 3/9) 06/16/19  Yes Patrecia Pour, MD  sodium bicarbonate 650 MG tablet Take 650 mg by mouth 2 (two) times daily. 11/18/21  Yes [provider]  tacrolimus (PROGRAF) 0.5 MG capsule Take 1.5 mg by mouth 2 (two) times daily.    Yes [provider]  Dulaglutide (TRULICITY) 1.5 GY/6.9SW SOPN Inject 1.5 mg into the skin every 7 (seven) days.     [provider]  oxyCODONE (ROXICODONE)  5 MG immediate release tablet Take 1 tablet (5 mg total) by mouth every 6 (six) hours as needed for up to 12 doses for severe pain. Patient not taking: Reported on 03/09/2022 12/21/21   Fransico Meadow, MD     Critical care time: 35 min    The patient is critically ill with multiple organ systems failure and requires high complexity decision making for assessment and support including BiPAP and dialysis, frequent evaluation and titration of therapies, application of advanced monitoring technologies and extensive interpretation of multiple databases.    Rodman Pickle, M.D. [facility] Pulmonary/Critical Care Medicine 03/12/2022 7:42 AM   Please see Amion for pager number to reach on-call Pulmonary and Critical Care Team.

## 2022-03-13 DIAGNOSIS — I5033 Acute on chronic diastolic (congestive) heart failure: Secondary | ICD-10-CM | POA: Diagnosis not present

## 2022-03-13 LAB — APTT: aPTT: 43 seconds — ABNORMAL HIGH (ref 24–36)

## 2022-03-13 LAB — CBC
HCT: 27.7 % — ABNORMAL LOW (ref 39.0–52.0)
Hemoglobin: 8.5 g/dL — ABNORMAL LOW (ref 13.0–17.0)
MCH: 29.8 pg (ref 26.0–34.0)
MCHC: 30.7 g/dL (ref 30.0–36.0)
MCV: 97.2 fL (ref 80.0–100.0)
Platelets: 186 10*3/uL (ref 150–400)
RBC: 2.85 MIL/uL — ABNORMAL LOW (ref 4.22–5.81)
RDW: 15.1 % (ref 11.5–15.5)
WBC: 6.7 10*3/uL (ref 4.0–10.5)
nRBC: 0 % (ref 0.0–0.2)

## 2022-03-13 LAB — GLUCOSE, CAPILLARY
Glucose-Capillary: 126 mg/dL — ABNORMAL HIGH (ref 70–99)
Glucose-Capillary: 147 mg/dL — ABNORMAL HIGH (ref 70–99)
Glucose-Capillary: 172 mg/dL — ABNORMAL HIGH (ref 70–99)
Glucose-Capillary: 138 mg/dL — ABNORMAL HIGH (ref 70–99)

## 2022-03-13 LAB — RENAL FUNCTION PANEL
Albumin: 3.2 g/dL — ABNORMAL LOW (ref 3.5–5.0)
Albumin: 3.1 g/dL — ABNORMAL LOW (ref 3.5–5.0)
Anion gap: 9 (ref 5–15)
Anion gap: 4 — ABNORMAL LOW (ref 5–15)
BUN: 45 mg/dL — ABNORMAL HIGH (ref 8–23)
BUN: 48 mg/dL — ABNORMAL HIGH (ref 8–23)
CO2: 24 mmol/L (ref 22–32)
CO2: 25 mmol/L (ref 22–32)
Calcium: 7.7 mg/dL — ABNORMAL LOW (ref 8.9–10.3)
Calcium: 7.7 mg/dL — ABNORMAL LOW (ref 8.9–10.3)
Chloride: 106 mmol/L (ref 98–111)
Chloride: 111 mmol/L (ref 98–111)
Creatinine, Ser: 2.22 mg/dL — ABNORMAL HIGH (ref 0.61–1.24)
Creatinine, Ser: 2.47 mg/dL — ABNORMAL HIGH (ref 0.61–1.24)
GFR, Estimated: 31 mL/min — ABNORMAL LOW (ref >60)
GFR, Estimated: 28 mL/min — ABNORMAL LOW (ref >60)
Glucose, Bld: 131 mg/dL — ABNORMAL HIGH (ref 70–99)
Glucose, Bld: 194 mg/dL — ABNORMAL HIGH (ref 70–99)
Phosphorus: 2.6 mg/dL (ref 2.5–4.6)
Phosphorus: 3 mg/dL (ref 2.5–4.6)
Potassium: 3.9 mmol/L (ref 3.5–5.1)
Potassium: 4 mmol/L (ref 3.5–5.1)
Sodium: 139 mmol/L (ref 135–145)
Sodium: 140 mmol/L (ref 135–145)

## 2022-03-13 LAB — MAGNESIUM: Magnesium: 2.5 mg/dL — ABNORMAL HIGH (ref 1.7–2.4)

## 2022-03-13 MED ORDER — DEUTETRABENAZINE 6 MG PO TABS
6.0000 mg | ORAL_TABLET | Freq: Two times a day (BID) | ORAL | Status: DC
  Administered 2022-03-13 – 2022-03-27 (×29): 6 mg via ORAL
  Filled 2022-03-13 (×25): qty 1

## 2022-03-13 MED ORDER — QUETIAPINE FUMARATE 50 MG PO TABS
25.0000 mg | ORAL_TABLET | Freq: Every day | ORAL | Status: DC
  Administered 2022-03-13: 25 mg via ORAL
  Filled 2022-03-13: qty 1

## 2022-03-13 MED ORDER — HALOPERIDOL LACTATE 5 MG/ML IJ SOLN: 1.0000 mg | INTRAMUSCULAR | Status: DC | PRN

## 2022-03-13 MED ORDER — DEUTETRABENAZINE 6 MG PO TABS: 6.0000 mg | ORAL_TABLET | Freq: Two times a day (BID) | ORAL | Status: DC

## 2022-03-13 MED ORDER — HYDRALAZINE HCL 20 MG/ML IJ SOLN
10.0000 mg | Freq: Once | INTRAMUSCULAR | Status: AC
  Administered 2022-03-13: 10 mg via INTRAVENOUS
  Filled 2022-03-13: qty 1

## 2022-03-13 MED ORDER — HALOPERIDOL LACTATE 5 MG/ML IJ SOLN
1.0000 mg | INTRAMUSCULAR | Status: DC | PRN
  Filled 2022-03-13: qty 1

## 2022-03-13 NOTE — Progress Notes (Signed)
[facility] Progress Note  Subjective:  Seen in room, on CRRT More awake, memory better BPs are normal to high Tolerating UF, 3.9L neg yesterday; 0.8L UOP yesterday Down to 40% FIO2 K 3.9, P 2.6 Renal Tplt [place] w/o acute issues or HN Using Temp HD cath L IJ   Vitals:   03/13/22 0900 03/13/22 1000 03/13/22 1200 03/13/22 1300  BP: (!) 163/69 (!) 155/71 (!) 153/59 (!) 152/56  Pulse: 68 78 70 (!) 52  Resp: 20 (!) 22 20 (!) 32  Temp:   98.4 F (36.9 C)   TempSrc:   Oral   SpO2: 94% (!) 89% 93% 93%  Weight:      Height:        Exam: Gen is very awake today No rash, cyanosis or gangrene Sclera anicteric, throat clear  No jvd or bruits Chest clear bilat to bases, no rales/ wheezing RRR no MRG Abd soft ntnd no mass or ascites +bs GU normal male MS no joint effusions or deformity Ext No sig LEE Neuro as above, has movement disorder (restlessness)      Home meds include - coreg 12.5 bid, lasix 20 mg mwf, austedo, synthorid, creon, myfortic 180 bid, prednisone 5 qd, tacrolimus 1.5 bid       Date                           Creat               eGFR   2017-2018                 1.11- 1.56   2020                          1.88   2021                          1.54- 2.09   Sept 2023                  3.56                 18 ml/min    11/1- 02/28/22           4.38 >> 3.28    AKI on CKD4, 14 >> 20 ml/min    11/17                                    5.90                 10     11/18                                    5.78                 10       UA negative    UNa <10, UCr 151    CXR - IMPRESSION: Very low lung volumes. Bilateral patchy airspace opacities may represent a combination of bronchovascular crowding, pulmonary edema, and possible multifocal infection.     Temp 992, wBC 4K  , BP 140/70, HR 60 -70, RR        Assessment/ Plan: AKI on CKD 4 transplant - Started  CRRT 03/11/22 - 11/21:  b/l creatinine 3.3- 3.6, eGFR 18- 20 ml/min.  Creat here 5.9 on admission  11/17 in the setting of SOB and recent admission for PNA rx'd w/ abx. Likely ATN in setting of advanced CKD UA neg, urine lytes c/w pre-renal.  11/20 Tplt renal US normal/ no HN Stop CRRT today, needs transfer to [facility] for ongoing iHD needs If no clear improvement in next 24-48h plan CLIP to [facility] Renal transplant - done around 2010, cont prograf, myfortic and pred.  Renal US stable. Cont po meds.  DM2 - on trulicity, insulin pump at home. Per pmd HTN - cont coreg, amlodipine and hydralazine, holding lasix Movement disorder - chronic issue, this is not myoclonus but is his baseline "chorea" per the sister. Getting Hillery Aldo, MD  03/13/2022, 1:24 PM   Recent Labs  Lab 03/12/22 0301 03/12/22 1601 03/13/22 0658  HGB 8.0*  --  8.5*  ALBUMIN 3.5 3.2* 3.2*  CALCIUM 7.5* 7.5* 7.7*  PHOS 7.1* 3.9 2.6  CREATININE 4.32* 2.88* 2.22*  K 4.8 4.0 3.9    No results for input(s): "IRON", "TIBC", "FERRITIN" in the last 168 hours. Inpatient medications:  albuterol  2.5 mg Nebulization Q6H   amLODipine  10 mg Oral Daily   atorvastatin  10 mg Oral Daily   Chlorhexidine Gluconate Cloth  6 each Topical Daily   feeding supplement  237 mL Oral BID BM   heparin  5,000 Units Subcutaneous Q8H   hydrALAZINE  50 mg Oral TID   insulin aspart  0-5 Units Subcutaneous QHS   insulin aspart  0-9 Units Subcutaneous TID WC   insulin glargine-yfgn  10 Units Subcutaneous Daily   levothyroxine  112 mcg Oral Q0600   lipase/protease/amylase  72,000 Units Oral TID WC   melatonin  3 mg Oral QHS   multivitamin  1 tablet Oral QHS   mycophenolate  180 mg Oral BID   mouth rinse  15 mL Mouth Rinse 4 times per day   predniSONE  5 mg Oral Q breakfast   senna  1 tablet Oral BID   tacrolimus  1.5 mg Oral BID     prismasol BGK 4/2.5 400 mL/hr at 03/13/22 0753    prismasol BGK 4/2.5 400 mL/hr at 03/13/22 0753   dexmedetomidine (PRECEDEX) IV infusion Stopped (03/13/22 0738)   heparin 10,000 units/  20 mL infusion syringe 500 Units/hr (03/13/22 0621)   prismasol BGK 4/2.5 1,500 mL/hr at 03/13/22 0616   acetaminophen **OR** acetaminophen, albuterol, alteplase, heparin, hydrALAZINE, ondansetron **OR** ondansetron (ZOFRAN) IV, mouth rinse, polyethylene glycol, sodium chloride

## 2022-03-13 NOTE — Consult Note (Signed)
NAME:  Nathan Tyler, MRN:  [id number], DOB:  1956/02/10, LOS: 4 ADMISSION DATE:  03/09/2022, CONSULTATION DATE:  03/11/22 REFERRING MD:  Pietro Cassis, CHIEF COMPLAINT:  confusion   History of Present Illness:  27yM with history of renal transplant 2008 on IS (prograf, MMF, prednisone), CKD4 follows with Dr. Marval Regal, DM2 on insulin pump, HTN, hypothyroid, movement disorder who was recently hospitalized 11/1 through 11/8 for CAP, discharged home with home health RN, PT. After a couple days at home develoiped progressive weakness, no fever or worsening of cough. Went to [facility] 11/17 for dyspnea and found ot have O2 saturation of 80% on RA. Given neb and then sent to ED where he was found to have acute on chronic hypercapnic respiratory failure vbg 7.27/57, AKI on CKD, elevated BNP. Admitted to hospitalist service and started on levaquin for possible recurrent CAP, gentle IVF for AKI. All home IS continued here.   PCCM consulted 11/19 for AKI on CKD, worsening acute metabolic encephalopathy and uremia. Plan for likely CRRT.  Pertinent  Medical History  Renal transplant 2008 on IS CKD4 HTN DM2 on insulin pump Hypothyroid Movement disorder  Significant Hospital Events: Including procedures, antibiotic start and stop dates in addition to other pertinent events   11/17 admitted and started on levaquin, IVF  11/19 transfer to ICU for CRRT  Interim History / Subjective:  On CRRT Tolerated BiPAP overnight. On [place] on FIO2 60% Some agitation requiring Precedex  Objective   Blood pressure (!) 162/60, pulse (!) 58, temperature 98.6 F (37 C), temperature source Axillary, resp. rate 18, height 6' (1.829 m), weight 109.4 kg, SpO2 98 %.    FiO2 (%):  [40 %-80 %] 40 %   Intake/Output Summary (Last 24 hours) at 03/13/2022 0745 Last data filed at 03/13/2022 0700 Gross per 24 hour  Intake 396.19 ml  Output 4376 ml  Net -3979.81 ml   Filed Weights   03/09/22 1231 03/12/22 0457 03/13/22 0500  Weight:  104 kg 117.9 kg 109.4 kg   Physical Exam: General: Chronically ill-appearing, no acute distress HENT: [place], AT, OP clear, MMM Eyes: EOMI, no scleral icterus Respiratory: Diminished but clear to auscultation bilaterally.  No crackles, wheezing or rales Cardiovascular: RRR, -M/R/G, no JVD GI: BS+, soft, nontender Extremities:: 2+ pitting edema,-tenderness Neuro: AAO x2, CNII-XII grossly intact  CXR 03/11/22 - Bibasilar pulmonary edema, small pleural effusions. R IJ in place Renal US 03/12/22 - Possible small cyst. Right lower quadrant renal transplant.  Resolved Hospital Problem list   Hyperkalemia, mild Hyperphosphatemia  Assessment & Plan:   Acute hypoxic respiratory failure 2/2 pulmonary edema/pleural effusions Acute on chronic hypercapnic respiratory failure Acute on chronic diastolic heart failure Low suspicion for infection. Antibiotics discontinued after admission day. - Wean HHFNC for goal >88-92% - BiPAP nightly - CRRT per Nephrology. May be able to transition to iHD  Acute metabolic encephalopathy - improving Movement disorder  Agitation  - Wean off Precedex - Restart home Deutetrabenazine to daily. Then step up to BID if mental status ok - Minimize sedating meds - Re-orient   AKI on CKD4 - improving Uremia - improving Renal transplant 2008 - CRRT per Nephrology. Hold home sodium bicarb  - Continue myfortic, prograf and prednisone 5 mg daily  Anemia of chronic disease - trend cbc  DM2  - basal/bolus - insulin pump off  Acute on chronic diastolic heart failure HTN - Restart home amlodipine, hydralazine - dialysis as above  Hypothyroid - home synthroid  DNR status   Best  Practice (right click and "Reselect all SmartList Selections" daily)   Diet/type: Regular consistency (see orders) DVT prophylaxis: prophylactic heparin  GI prophylaxis: N/A Lines: Dialysis Catheter Foley:  Yes, and it is still needed Code Status:  DNR Last date of  multidisciplinary goals of care discussion [pt and sister updated at bedside]  Critical care time: 34 min   The patient is critically ill with multiple organ systems failure and requires high complexity decision making for assessment and support, frequent evaluation and titration of therapies, application of advanced monitoring technologies and extensive interpretation of multiple databases.    Rodman Pickle, M.D. [facility] Pulmonary/Critical Care Medicine 03/13/2022 7:45 AM   Please see Amion for pager number to reach on-call Pulmonary and Critical Care Team.

## 2022-03-13 NOTE — Progress Notes (Signed)
Patient refused 3 am blood sugar check.

## 2022-03-13 NOTE — Plan of Care (Signed)
  Problem: Education: Goal: Ability to describe self-care measures that may prevent or decrease complications (Diabetes Survival Skills Education) will improve Outcome: Progressing Goal: Individualized Educational Video(s) Outcome: Progressing   Problem: Coping: Goal: Ability to adjust to condition or change in health will improve Outcome: Progressing   Problem: Fluid Volume: Goal: Ability to maintain a balanced intake and output will improve Outcome: Not Progressing Note: R/t patient fluid overloaded needing high 02 and swelling in BLE   Problem: Health Behavior/Discharge Planning: Goal: Ability to identify and utilize available resources and services will improve Outcome: Not Progressing Goal: Ability to manage health-related needs will improve Outcome: Progressing   Problem: Metabolic: Goal: Ability to maintain appropriate glucose levels will improve Outcome: Progressing   Problem: Nutritional: Goal: Maintenance of adequate nutrition will improve Outcome: Not Progressing Goal: Progress toward achieving an optimal weight will improve Outcome: Progressing   Problem: Tissue Perfusion: Goal: Adequacy of tissue perfusion will improve Outcome: Not Progressing Note: R/t patient needing assistance with moving and turns has been in the bed for days due to needing CRRT   Problem: Education: Goal: Knowledge of General Education information will improve Description: Including pain rating scale, medication(s)/side effects and non-pharmacologic comfort measures Outcome: Progressing   Problem: Health Behavior/Discharge Planning: Goal: Ability to manage health-related needs will improve Outcome: Progressing   Problem: Clinical Measurements: Goal: Ability to maintain clinical measurements within normal limits will improve Outcome: Progressing Goal: Will remain free from infection Outcome: Progressing Goal: Diagnostic test results will improve Outcome: Progressing Goal:  Respiratory complications will improve Outcome: Not Progressing Note: R/T high needs of 02 Goal: Cardiovascular complication will be avoided Outcome: Progressing   Problem: Activity: Goal: Risk for activity intolerance will decrease Outcome: Not Progressing Note: R/t patient needing assistance with moving and turns has been in the bed for days due to needing CRRT   Problem: Nutrition: Goal: Adequate nutrition will be maintained Outcome: Progressing   Problem: Coping: Goal: Level of anxiety will decrease Outcome: Progressing   Problem: Elimination: Goal: Will not experience complications related to bowel motility Outcome: Not Progressing Note: R/t patient incontinent  Goal: Will not experience complications related to urinary retention Outcome: Not Progressing Note: R/t urinary retention foley in place     Problem: Pain Managment: Goal: General experience of comfort will improve Outcome: Progressing   Problem: Safety: Goal: Ability to remain free from injury will improve Outcome: Not Progressing Note: R/t patient goes in and out of confusion pulled HD cath prior to night shift has no memory of doing it on night RN assessment   Problem: Skin Integrity: Goal: Risk for impaired skin integrity will decrease Outcome: Not Progressing Note: R/t patient needing assistance with moving and turns has been in the bed for days due to needing CRRT

## 2022-03-13 NOTE — Progress Notes (Addendum)
[facility] Progress Note Patient Name: Nathan Tyler DOB: 1955-05-20 MRN: [id number]   Date of Service  03/13/2022  HPI/Events of Note  Agitation - Sundowning. Nursing reports that he pulled out HD cath before change of shift. QTc interval 0.47. Patient restarted on a Precedex IV infusion prior to shift change.  eICU Interventions  Plan: Haldol 1 mg IV Q 3 hours PRN agitation or severe agitation.  Monitor QTc interval Q 6 hours. Notify MD if QTc interval > 0.5 seconds.  Precedex IV Infusion (0.2-0.6 mcg/kg/hour). Titrate to RASS = 0.  D/C transfer to Progressive bed at [facility].      Intervention Category Major Interventions: Delirium, psychosis, severe agitation - evaluation and management  Lysle Dingwall 03/13/2022, 7:36 PM

## 2022-03-13 NOTE — Progress Notes (Addendum)
Patient was awoken by lab and confused on bipap. Patient stated "I am calling the police on you demon. Get away from me." Patient was refusing to let nurse touch him at this time. Patient refused to have labs drawn at this time.

## 2022-03-13 NOTE — Progress Notes (Signed)
Bed alarm sounding off. On arrival to room patient covered in blood and HD catheter laying in bed. Pressure immediately applied to site and another RN prepared gauze and Vaseline dressing. Patient cleaned and assisted back to new bed. Precedex drip restarted in [facility]

## 2022-03-13 NOTE — Evaluation (Signed)
Physical Therapy Evaluation Patient Details Name: Nathan Tyler MRN: [id number] DOB: 1955-08-09 Today's Date: 03/13/2022  History of Present Illness  66 y.o. male  past medical history significant for renal transplant chronic renal disease stage IV, diabetes mellitus type 2, essential hypertension , movement disorder who was recently hospitalized 11/1 through 11/8 for CAP, discharged home with home health RN, PT. Returned to [facility] 03/09/22, then ED with progressive weakness,dyspnea , hypoxiaand  found to have acute on chronic hypercapnic respiratory failure  Clinical Impression    Pt admitted with above diagnosis.  Pt currently with functional limitations due to the deficits listed below (see PT Problem List). Pt will benefit from skilled PT to increase their independence and safety with mobility to allow discharge to the venue listed below.   The patient has been taken off CRRT today.  Patient  able to mobilize to sitting and stand and to pivot to the [facility] and back to bed with Mod assist. +2 for safety  for hygiene after BM. Patient was ambulatory with a rollator at  DC from [facility] on 11/8. Family not present. Patient states he is not home alone, no family present.       Recommendations for follow up therapy are one component of a multi-disciplinary discharge planning process, led by the attending physician.  Recommendations may be updated based on patient status, additional functional criteria and insurance authorization.  Follow Up Recommendations Skilled nursing-short term rehab (<3 hours/day) (unless progresses for home) Can patient physically be transported by private vehicle: No    Assistance Recommended at Discharge Frequent or constant Supervision/Assistance  Patient can return home with the following  A lot of help with walking and/or transfers;A little help with bathing/dressing/bathroom;Assistance with cooking/housework;Assist for transportation;Help with stairs or ramp for entrance     Equipment Recommendations None recommended by PT  Recommendations for Other Services       Functional Status Assessment Patient has had a recent decline in their functional status and demonstrates the ability to make significant improvements in function in a reasonable and predictable amount of time.     Precautions / Restrictions Precautions Precautions: Fall Precaution Comments: on HHFNC      Mobility  Bed Mobility   Bed Mobility: Supine to Sit, Sit to Supine     Supine to sit: Mod assist Sit to supine: Min assist   General bed mobility comments: assist with trunk to sitting upright    Transfers Overall transfer level: Needs assistance Equipment used: None Transfers: Sit to/from Stand, Bed to chair/wheelchair/BSC Sit to Stand: Min assist Stand pivot transfers: Mod assist         General transfer comment: cues for safety to reach for BSC, decreased contol, Stood holding to back opf chair to be cleaned, then able to step to bed.    Ambulation/Gait                  Stairs            Wheelchair Mobility    Modified Rankin (Stroke Patients Only)       Balance Overall balance assessment: Needs assistance Sitting-balance support: Feet supported, Bilateral upper extremity supported Sitting balance-Leahy Scale: Fair     Standing balance support: Bilateral upper extremity supported, During functional activity, Reliant on assistive device for balance Standing balance-Leahy Scale: Poor  Pertinent Vitals/Pain Pain Assessment Pain Assessment: No/denies pain    Home Living Family/patient expects to be discharged to:: Private residence Living Arrangements: Spouse/significant other Available Help at Discharge: Family;Available 24 hours/day Type of Home: House Home Access: Stairs to enter Entrance Stairs-Rails: None Entrance Stairs-Number of Steps: 3 Alternate Level Stairs-Number of Steps: 13 Home Layout:  Two level;Able to live on main level with bedroom/bathroom;Bed/bath upstairs Home Equipment: Conservation officer, nature (2 wheels) Additional Comments: Occasional use of RW,    Prior Function Prior Level of Function : Needs assist;History of Falls (last six months)       Physical Assist : Mobility (physical) Mobility (physical): Gait;Stairs ADLs (physical): Grooming;Bathing   ADLs Comments: unsure, no family, pt not clear     Hand Dominance   Dominant Hand: Left    Extremity/Trunk Assessment   Upper Extremity Assessment Upper Extremity Assessment: Overall WFL for tasks assessed (noted ballistic movements, tends to keep left hand under cover.)    Lower Extremity Assessment Lower Extremity Assessment:  (jerking movements, strength WFL for standing)    Cervical / Trunk Assessment Cervical / Trunk Assessment: Normal  Communication   Communication: No difficulties  Cognition Arousal/Alertness: Awake/alert Behavior During Therapy: Impulsive Overall Cognitive Status: Within Functional Limits for tasks assessed Area of Impairment: Safety/judgement                         Safety/Judgement: Decreased awareness of safety, Decreased awareness of deficits     General Comments: cues to slow pace,        General Comments      Exercises     Assessment/Plan    PT Assessment Patient needs continued PT services  PT Problem List Decreased strength;Decreased activity tolerance;Decreased balance;Decreased mobility;Decreased knowledge of use of DME;Cardiopulmonary status limiting activity;Obesity       PT Treatment Interventions DME instruction;Gait training;Stair training;Functional mobility training;Therapeutic activities;Therapeutic exercise;Balance training;Neuromuscular re-education    PT Goals (Current goals can be found in the Care Plan section)  Acute Rehab PT Goals Patient Stated Goal: Get well go home PT Goal Formulation: With patient Time For Goal Achievement:  03/27/22 Potential to Achieve Goals: Good    Frequency Min 3X/week     Co-evaluation               AM-PAC PT "6 Clicks" Mobility  Outcome Measure Help needed turning from your back to your side while in a flat bed without using bedrails?: A Little Help needed moving from lying on your back to sitting on the side of a flat bed without using bedrails?: A Little Help needed moving to and from a bed to a chair (including a wheelchair)?: A Lot Help needed standing up from a chair using your arms (e.g., wheelchair or bedside chair)?: A Lot Help needed to walk in hospital room?: Total Help needed climbing 3-5 steps with a railing? : Total 6 Click Score: 12    End of Session   Activity Tolerance: Patient tolerated treatment well Patient left: with call bell/phone within reach;in bed;with bed alarm set Nurse Communication: Mobility status PT Visit Diagnosis: Unsteadiness on feet (R26.81);Muscle weakness (generalized) (M62.81)    Time: 5809-9833 PT Time Calculation (min) (ACUTE ONLY): 22 min   Charges:   PT Evaluation $PT Eval Low Complexity: 1 Low          [place] Office [phone] Weekend [phone]   Claretha Cooper 03/13/2022, 3:25 PM

## 2022-03-13 NOTE — Progress Notes (Addendum)
1901 patient on DEX at 0.5 responds to name  2000 spke with E-Link MD new orders  2050 patient de sating on HF placed on BIPAP 2335 patient pulling off Bipap increased anxiety more confused reorientation needed.

## 2022-03-14 ENCOUNTER — Inpatient Hospital Stay (HOSPITAL_COMMUNITY): Payer: Medicare Other

## 2022-03-14 ENCOUNTER — Encounter (HOSPITAL_COMMUNITY): Payer: Self-pay | Admitting: Internal Medicine

## 2022-03-14 DIAGNOSIS — D649 Anemia, unspecified: Secondary | ICD-10-CM

## 2022-03-14 DIAGNOSIS — R451 Restlessness and agitation: Secondary | ICD-10-CM

## 2022-03-14 DIAGNOSIS — N184 Chronic kidney disease, stage 4 (severe): Secondary | ICD-10-CM | POA: Diagnosis not present

## 2022-03-14 DIAGNOSIS — J9601 Acute respiratory failure with hypoxia: Secondary | ICD-10-CM | POA: Diagnosis not present

## 2022-03-14 DIAGNOSIS — N179 Acute kidney failure, unspecified: Secondary | ICD-10-CM | POA: Diagnosis not present

## 2022-03-14 HISTORY — PX: IR PERC TUN PERIT CATH WO PORT S&I /IMAG: IMG2327

## 2022-03-14 HISTORY — PX: IR FLUORO GUIDE CV LINE RIGHT: IMG2283

## 2022-03-14 HISTORY — PX: IR US GUIDE VASC ACCESS RIGHT: IMG2390

## 2022-03-14 LAB — RENAL FUNCTION PANEL
Albumin: 2.9 g/dL — ABNORMAL LOW (ref 3.5–5.0)
Albumin: 3.1 g/dL — ABNORMAL LOW (ref 3.5–5.0)
Anion gap: 5 (ref 5–15)
Anion gap: 5 (ref 5–15)
BUN: 50 mg/dL — ABNORMAL HIGH (ref 8–23)
BUN: 49 mg/dL — ABNORMAL HIGH (ref 8–23)
CO2: 26 mmol/L (ref 22–32)
CO2: 26 mmol/L (ref 22–32)
Calcium: 8 mg/dL — ABNORMAL LOW (ref 8.9–10.3)
Calcium: 8.1 mg/dL — ABNORMAL LOW (ref 8.9–10.3)
Chloride: 112 mmol/L — ABNORMAL HIGH (ref 98–111)
Chloride: 111 mmol/L (ref 98–111)
Creatinine, Ser: 2.61 mg/dL — ABNORMAL HIGH (ref 0.61–1.24)
Creatinine, Ser: 2.78 mg/dL — ABNORMAL HIGH (ref 0.61–1.24)
GFR, Estimated: 26 mL/min — ABNORMAL LOW (ref >60)
GFR, Estimated: 24 mL/min — ABNORMAL LOW (ref >60)
Glucose, Bld: 135 mg/dL — ABNORMAL HIGH (ref 70–99)
Glucose, Bld: 130 mg/dL — ABNORMAL HIGH (ref 70–99)
Phosphorus: 3.7 mg/dL (ref 2.5–4.6)
Phosphorus: 3.7 mg/dL (ref 2.5–4.6)
Potassium: 3.8 mmol/L (ref 3.5–5.1)
Potassium: 3.9 mmol/L (ref 3.5–5.1)
Sodium: 143 mmol/L (ref 135–145)
Sodium: 142 mmol/L (ref 135–145)

## 2022-03-14 LAB — GLUCOSE, CAPILLARY
Glucose-Capillary: 132 mg/dL — ABNORMAL HIGH (ref 70–99)
Glucose-Capillary: 120 mg/dL — ABNORMAL HIGH (ref 70–99)
Glucose-Capillary: 139 mg/dL — ABNORMAL HIGH (ref 70–99)
Glucose-Capillary: 178 mg/dL — ABNORMAL HIGH (ref 70–99)
Glucose-Capillary: 174 mg/dL — ABNORMAL HIGH (ref 70–99)

## 2022-03-14 LAB — CBC
HCT: 26.9 % — ABNORMAL LOW (ref 39.0–52.0)
Hemoglobin: 8.1 g/dL — ABNORMAL LOW (ref 13.0–17.0)
MCH: 29.7 pg (ref 26.0–34.0)
MCHC: 30.1 g/dL (ref 30.0–36.0)
MCV: 98.5 fL (ref 80.0–100.0)
Platelets: 184 10*3/uL (ref 150–400)
RBC: 2.73 MIL/uL — ABNORMAL LOW (ref 4.22–5.81)
RDW: 15.2 % (ref 11.5–15.5)
WBC: 5.7 10*3/uL (ref 4.0–10.5)
nRBC: 0 % (ref 0.0–0.2)

## 2022-03-14 LAB — AMMONIA: Ammonia: 20 umol/L (ref 9–35)

## 2022-03-14 LAB — APTT: aPTT: 38 seconds — ABNORMAL HIGH (ref 24–36)

## 2022-03-14 LAB — MAGNESIUM: Magnesium: 2.6 mg/dL — ABNORMAL HIGH (ref 1.7–2.4)

## 2022-03-14 LAB — T4, FREE: Free T4: 1.04 ng/dL (ref 0.61–1.12)

## 2022-03-14 MED ORDER — QUETIAPINE FUMARATE 25 MG PO TABS
50.0000 mg | ORAL_TABLET | Freq: Every day | ORAL | Status: DC
  Administered 2022-03-14 – 2022-03-26 (×13): 50 mg via ORAL
  Filled 2022-03-14: qty 1
  Filled 2022-03-14: qty 2
  Filled 2022-03-14: qty 1
  Filled 2022-03-14 (×6): qty 2
  Filled 2022-03-14 (×2): qty 1
  Filled 2022-03-14 (×2): qty 2

## 2022-03-14 MED ORDER — LIDOCAINE HCL (PF) 1 % IJ SOLN
INTRAMUSCULAR | Status: DC | PRN
  Administered 2022-03-14: 30 mL

## 2022-03-14 MED ORDER — HALOPERIDOL LACTATE 5 MG/ML IJ SOLN: 5.0000 mg | Freq: Once | INTRAMUSCULAR | Status: DC

## 2022-03-14 MED ORDER — GELATIN ABSORBABLE 12-7 MM EX MISC
CUTANEOUS | Status: AC
  Filled 2022-03-14: qty 1

## 2022-03-14 MED ORDER — LIDOCAINE HCL 1 % IJ SOLN
INTRAMUSCULAR | Status: AC
  Filled 2022-03-14: qty 20

## 2022-03-14 MED ORDER — LIDOCAINE-EPINEPHRINE 1 %-1:100000 IJ SOLN
INTRAMUSCULAR | Status: AC
  Filled 2022-03-14: qty 1

## 2022-03-14 MED ORDER — CEFAZOLIN SODIUM-DEXTROSE 2-4 GM/100ML-% IV SOLN
INTRAVENOUS | Status: AC
  Administered 2022-03-14: 2 g via INTRAVENOUS
  Filled 2022-03-14: qty 100

## 2022-03-14 MED ORDER — CEFAZOLIN SODIUM-DEXTROSE 2-4 GM/100ML-% IV SOLN: 2.0000 g | INTRAVENOUS | Status: AC

## 2022-03-14 MED ORDER — HEPARIN SODIUM (PORCINE) 1000 UNIT/ML IJ SOLN
INTRAMUSCULAR | Status: AC
  Administered 2022-03-14: 3.8 mL
  Filled 2022-03-14: qty 10

## 2022-03-14 MED ORDER — FENTANYL CITRATE (PF) 100 MCG/2ML IJ SOLN
INTRAMUSCULAR | Status: AC
  Filled 2022-03-14: qty 2

## 2022-03-14 NOTE — Procedures (Signed)
Interventional Radiology Procedure:   Indications: Acute on chronic renal failure  Procedure: Tunneled dialysis catheter placement  Findings: Right jugular Palindrome, 23 cm, tip at SVC/RA junction.   Complications: No immediate complications noted.     EBL: Minimal  Plan: Dialysis catheter is ready for use.    Nieshia Larmon R. Anselm Pancoast, MD  Pager: [phone]

## 2022-03-14 NOTE — Progress Notes (Signed)
[facility] Progress Note  Subjective:  Delirium overnight Temp HD cath fell out More calm this AM, resting UOP > 1L Labs reflect DC of CRRT   Vitals:   03/14/22 0900 03/14/22 1000 03/14/22 1100 03/14/22 1124  BP: (!) 164/65 (!) 159/56 (!) 149/46   Pulse: 69 71 61 66  Resp: 19 (!) _0 Temp:      TempSrc:      SpO2: 100% 99% 100% 100%  Weight:      Height:        Exam: Gen is resting No rash, cyanosis or gangrene Sclera anicteric, throat clear  No jvd or bruits Chest clear bilat to bases, no rales/ wheezing RRR no MRG Abd soft ntnd no mass or ascites +bs GU normal male MS no joint effusions or deformity Ext No sig LEE Neuro as above, has movement disorder (restlessness)      Home meds include - coreg 12.5 bid, lasix 20 mg mwf, austedo, synthorid, creon, myfortic 180 bid, prednisone 5 qd, tacrolimus 1.5 bid       Date                           Creat               eGFR   2017-2018                 1.11- 1.56   2020                          1.88   2021                          1.54- 2.09   Sept 2023                  3.56                 18 ml/min    11/1- 02/28/22           4.38 >> 3.28    AKI on CKD4, 14 >> 20 ml/min    11/17                                    5.90                 10     11/18                                    5.78                 10       UA negative    UNa <10, UCr 151    CXR - IMPRESSION: Very low lung volumes. Bilateral patchy airspace opacities may represent a combination of bronchovascular crowding, pulmonary edema, and possible multifocal infection.     Temp 992, wBC 4K  , BP 140/70, HR 60 -70, RR        Assessment/ Plan: AKI on CKD 4 transplant - Started CRRT 03/11/22 - 11/21:  b/l creatinine 3.3- 3.6, eGFR 18- 20 ml/min.  Creat here 5.9 on admission 11/17 in the setting of SOB and recent admission for PNA rx'd w/ abx.  Likely ATN in setting of advanced CKD UA neg, urine lytes c/w pre-renal.  11/20 Tplt renal US  normal/ no HN IR consulted for [facility] To transfer to [facility] thereafter for further HD Follow daily labs and UOP to determine if any GFR recovery If no clear improvement in next 24-48h plan CLIP to [facility] Renal transplant - done around 2010, cont prograf, myfortic and pred.  Renal US stable. Cont transplant meds.  DM2 - on trulicity, insulin pump at home. Per pmd HTN - cont coreg, amlodipine and hydralazine, holding lasix Movement disorder - chronic issue, this is not myoclonus but is his baseline "chorea" per the sister. Getting Hillery Aldo, MD  03/14/2022, 11:58 AM   Recent Labs  Lab 03/13/22 0658 03/13/22 1600 03/14/22 0726 03/14/22 1021  HGB 8.5*  --  8.1*  --   ALBUMIN 3.2*   < > 2.9* 3.1*  CALCIUM 7.7*   < > 8.0* 8.1*  PHOS 2.6   < > 3.7 3.7  CREATININE 2.22*   < > 2.61* 2.78*  K 3.9   < > 3.8 3.9   < > = values in this interval not displayed.    No results for input(s): "IRON", "TIBC", "FERRITIN" in the last 168 hours. Inpatient medications:  albuterol  2.5 mg Nebulization Q6H   amLODipine  10 mg Oral Daily   atorvastatin  10 mg Oral Daily   Chlorhexidine Gluconate Cloth  6 each Topical Daily   Deutetrabenazine  6 mg Oral BID   feeding supplement  237 mL Oral BID BM   heparin  5,000 Units Subcutaneous Q8H   hydrALAZINE  50 mg Oral TID   insulin aspart  0-5 Units Subcutaneous QHS   insulin aspart  0-9 Units Subcutaneous TID WC   insulin glargine-yfgn  10 Units Subcutaneous Daily   levothyroxine  112 mcg Oral Q0600   lipase/protease/amylase  72,000 Units Oral TID WC   melatonin  3 mg Oral QHS   multivitamin  1 tablet Oral QHS   mycophenolate  180 mg Oral BID   mouth rinse  15 mL Mouth Rinse 4 times per day   predniSONE  5 mg Oral Q breakfast   QUEtiapine  50 mg Oral QHS   senna  1 tablet Oral BID   tacrolimus  1.5 mg Oral BID    prismasol BGK 4/2.5 Stopped (03/13/22 1240)   acetaminophen **OR** acetaminophen, albuterol, haloperidol lactate,  hydrALAZINE, ondansetron **OR** ondansetron (ZOFRAN) IV, mouth rinse, polyethylene glycol

## 2022-03-14 NOTE — Progress Notes (Signed)
Progress Note    Nathan Tyler   FFM:[id number]  DOB: Feb 05, 1956  DOA: 03/09/2022     5 PCP: Celene Squibb, MD  Initial CC: SOB  Hospital Course: Nathan Tyler is a 66 year old male with PMH CKD4 s/p transplant, chorea, DM II, HTN, HLD, hypothyroidism who presented with SOB.  He had recently been hospitalized from 11/1 - 11/8 as well for CAP and completed antibiotics in the hospital and was discharged home with home health. Due to developing recurrent shortness of breath and hypoxia, he presented back to the ER for further evaluation. CXR was more concerning for volume overload and he was found to have worsened renal function.  He was evaluated by nephrology and started on CRRT.  Interval History:  Patient had ongoing sundowning/delirium overnight and pulled out trialysis catheter accidentally.  Manual pressure held and dressing in place this morning.  He remains slightly confused, daughter is present this morning.  No further shortness of breath perceived. Plan is for undergoing PermCath placement today and still pursuing transfer to [facility].  Assessment and Plan:  AKI on CKD 4 Chronic metabolic acidosis baseline creatinine 3-4 Presented with creatinine elevated to 5.9 on admission - nephrology following - CRRT initiated at [facility] - perm cath placement scheduled for 11/22 then tx to [facility] for continuing dialysis  Acute respiratory failure with hypoxia - Presented with progressive shortness of breath.  No fever or worsening of cough. Required 4 L nasal cannula to maintain saturation more than 90% -CXR showing nonspecific bilateral opacities on admission.  Likely consistent with volume overload/edema - improved with CRRT and volume removal - continue weaning O2 as able  Acute metabolic encephalopathy - Likely multifactorial in setting of sundowning, delirium, renal failure, hypoxia - delirium precautions - required precedex - okay for low dose haldol but monitor for any worsening of  chorea, if occurs, will need to discontinue - repeat EKG: QTc 440, okay for Haldol use  Renal transplantation status 2008 Chronically immunosuppressed PTA on tacrolimus, mycophenolate, low-dose prednisone. Resume all.   Anemia of chronic disease -Baseline hemoglobin around 7 to 8 g/dL - Continue trending during hospitalization, remains at baseline  Type 2 diabetes mellitus with hypoglycemia A1c 5.7 on 57/0/1779 PTA on Trulicity weekly, insulin pump for bolus insulin.  Not on basal. -Hypoglycemia likely due to context of renal failure -Continue on SSI and CBG monitoring  Essential hypertension  - home regimen on hold - continue amlodipine and hydralazine   HLD Continue Lipitor 10 mg daily   Hypothyroidism Continue Synthroid 112 mcg daily   Movement disorder/Chorea PTA on Austedo 6 mg twice daily.  Continue    Mobility -Obtain PT eval  Old records reviewed in assessment of this patient   DVT prophylaxis:  heparin injection 5,000 Units Start: 03/09/22 2200   Code Status:   Code Status: DNR  Mobility Assessment (last 72 hours)     Mobility Assessment     Row Name 03/13/22 1523           What is the highest level of mobility based on the progressive mobility assessment? Level 5 (Walks with assist in room/hall) - Balance while stepping forward/back and can walk in room with assist - Complete                Barriers to discharge:  Disposition Plan:  Home Status is: Inpt  Objective: Blood pressure (!) 164/60, pulse 70, temperature 98.4 F (36.9 C), temperature source Oral, resp. rate 17, height 6' (1.829  m), weight 107.7 kg, SpO2 96 %.  Examination:  Physical Exam Constitutional:      Comments: [name] lying in bed in no distress  HENT:     Head: Normocephalic and atraumatic.     Mouth/Throat:     Mouth: Mucous membranes are moist.  Eyes:     Extraocular Movements: Extraocular movements intact.     Pupils: Pupils are  equal, round, and reactive to light.  Cardiovascular:     Rate and Rhythm: Normal rate and regular rhythm.  Pulmonary:     Effort: Pulmonary effort is normal.     Breath sounds: Normal breath sounds.  Abdominal:     General: Bowel sounds are normal. There is no distension.     Palpations: Abdomen is soft.     Tenderness: There is no abdominal tenderness.  Musculoskeletal:        General: Normal range of motion.     Cervical back: Normal range of motion and neck supple.  Skin:    General: Skin is warm and dry.  Neurological:     Comments: Disoriented to place but knows name, president, year      Consultants:  Nephrology PCCM  Procedures:    Data Reviewed: Results for orders placed or performed during the hospital encounter of 03/09/22 (from the past 24 hour(s))  Glucose, capillary     Status: Abnormal   Collection Time: 03/13/22  3:58 PM  Result Value Ref Range   Glucose-Capillary 172 (H) 70 - 99 mg/dL  Renal function panel (daily at 1600)     Status: Abnormal   Collection Time: 03/13/22  4:00 PM  Result Value Ref Range   Sodium 140 135 - 145 mmol/L   Potassium 4.0 3.5 - 5.1 mmol/L   Chloride 111 98 - 111 mmol/L   CO2 25 22 - 32 mmol/L   Glucose, Bld 194 (H) 70 - 99 mg/dL   BUN 48 (H) 8 - 23 mg/dL   Creatinine, Ser 2.47 (H) 0.61 - 1.24 mg/dL   Calcium 7.7 (L) 8.9 - 10.3 mg/dL   Phosphorus 3.0 2.5 - 4.6 mg/dL   Albumin 3.1 (L) 3.5 - 5.0 g/dL   GFR, Estimated 28 (L) >60 mL/min   Anion gap 4 (L) 5 - 15  Glucose, capillary     Status: Abnormal   Collection Time: 03/13/22  9:59 PM  Result Value Ref Range   Glucose-Capillary 138 (H) 70 - 99 mg/dL   Comment 1 Notify RN    Comment 2 Document in Chart   Glucose, capillary     Status: Abnormal   Collection Time: 03/14/22  3:14 AM  Result Value Ref Range   Glucose-Capillary 132 (H) 70 - 99 mg/dL   Comment 1 Notify RN    Comment 2 Document in Chart   Renal function panel (daily at 0500)     Status: Abnormal    Collection Time: 03/14/22  7:26 AM  Result Value Ref Range   Sodium 143 135 - 145 mmol/L   Potassium 3.8 3.5 - 5.1 mmol/L   Chloride 112 (H) 98 - 111 mmol/L   CO2 26 22 - 32 mmol/L   Glucose, Bld 135 (H) 70 - 99 mg/dL   BUN 50 (H) 8 - 23 mg/dL   Creatinine, Ser 2.61 (H) 0.61 - 1.24 mg/dL   Calcium 8.0 (L) 8.9 - 10.3 mg/dL   Phosphorus 3.7 2.5 - 4.6 mg/dL   Albumin 2.9 (L) 3.5 - 5.0 g/dL  GFR, Estimated 26 (L) >60 mL/min   Anion gap 5 5 - 15  Magnesium     Status: Abnormal   Collection Time: 03/14/22  7:26 AM  Result Value Ref Range   Magnesium 2.6 (H) 1.7 - 2.4 mg/dL  APTT     Status: Abnormal   Collection Time: 03/14/22  7:26 AM  Result Value Ref Range   aPTT 38 (H) 24 - 36 seconds  CBC     Status: Abnormal   Collection Time: 03/14/22  7:26 AM  Result Value Ref Range   WBC 5.7 4.0 - 10.5 K/uL   RBC 2.73 (L) 4.22 - 5.81 MIL/uL   Hemoglobin 8.1 (L) 13.0 - 17.0 g/dL   HCT 26.9 (L) 39.0 - 52.0 %   MCV 98.5 80.0 - 100.0 fL   MCH 29.7 26.0 - 34.0 pg   MCHC 30.1 30.0 - 36.0 g/dL   RDW 15.2 11.5 - 15.5 %   Platelets 184 150 - 400 K/uL   nRBC 0.0 0.0 - 0.2 %  Glucose, capillary     Status: Abnormal   Collection Time: 03/14/22  8:22 AM  Result Value Ref Range   Glucose-Capillary 120 (H) 70 - 99 mg/dL  Renal function panel (daily at 1600)     Status: Abnormal   Collection Time: 03/14/22 10:21 AM  Result Value Ref Range   Sodium 142 135 - 145 mmol/L   Potassium 3.9 3.5 - 5.1 mmol/L   Chloride 111 98 - 111 mmol/L   CO2 26 22 - 32 mmol/L   Glucose, Bld 130 (H) 70 - 99 mg/dL   BUN 49 (H) 8 - 23 mg/dL   Creatinine, Ser 2.78 (H) 0.61 - 1.24 mg/dL   Calcium 8.1 (L) 8.9 - 10.3 mg/dL   Phosphorus 3.7 2.5 - 4.6 mg/dL   Albumin 3.1 (L) 3.5 - 5.0 g/dL   GFR, Estimated 24 (L) >60 mL/min   Anion gap 5 5 - 15  Ammonia     Status: None   Collection Time: 03/14/22 10:21 AM  Result Value Ref Range   Ammonia 20 9 - 35 umol/L  T4, free     Status: None   Collection Time: 03/14/22 10:21  AM  Result Value Ref Range   Free T4 1.04 0.61 - 1.12 ng/dL  Glucose, capillary     Status: Abnormal   Collection Time: 03/14/22 12:30 PM  Result Value Ref Range   Glucose-Capillary 139 (H) 70 - 99 mg/dL   Comment 1 Notify RN    Comment 2 Document in Chart     I have Reviewed nursing notes, Vitals, and Lab results since pt's last encounter. Pertinent lab results : see above I have ordered test including BMP, CBC, Mg I have reviewed the last note from staff over past 24 hours I have discussed pt's care plan and test results with nursing staff, case manager   LOS: 5 days   Dwyane Dee, MD [facility] 03/14/2022, 1:39 PM

## 2022-03-14 NOTE — Hospital Course (Addendum)
66 year old male with PMH CKD4 s/p transplant, chorea, DM II, HTN, HLD, hypothyroidism who presented with SOB. He was recently been hospitalized from 11/1 - 11/8 as well for CAP and completed antibiotics in the hospital and was discharged home with home health. Seen in the ER, CXR was more concerning for volume overload and he was found to have worsened renal function.  He was evaluated by nephrology and admitted for acute hypoxic respiratory failure, acute metabolic encephalopathy and started on CRRT for fluid overload and acutely worsening renal failure on the CT of CKD stage IV in a transplant kidney.Patient was [facility] to [facility] hospital 03/15/22. Followed by nephrology AKI improved back to baseline CK.Mental status improved> IR removed [facility] 11/27.  Patient is is having fluid overload nephrology diuresing  Once renal function/fluid overload stabilizes planning to discharge to CIR

## 2022-03-14 NOTE — NC FL2 (Signed)
[place] LEVEL OF CARE SCREENING TOOL     IDENTIFICATION  Patient Name: Nathan Tyler Birthdate: 1956-02-23 Sex: male Admission Date (Current Location): 03/09/2022  [facility] and [place] Number:  Herbalist and Address:  [facility],  [place] [address], [place]      Provider Number: [id number]  Attending Physician Name and Address:  Dwyane Dee, MD  Relative Name and Phone Number:       Current Level of Care: Hospital Recommended Level of Care: [place] Prior Approval Number:    Date Approved/Denied:   PASRR Number: [id number]  Discharge Plan: SNF    Current Diagnoses: Patient Active Problem List   Diagnosis Date Noted   Malnutrition of moderate degree 03/12/2022   Acute renal failure ([place]) 03/11/2022   Acute renal failure superimposed on stage 4 chronic kidney disease ([place]) 02/22/2022   Acute on chronic anemia 81/82/9937   Acute metabolic encephalopathy 16/96/7893   Hyperkalemia 02/22/2022   DM2 (diabetes mellitus, type 2) ([place]) 02/22/2022   Chronic diastolic CHF (congestive heart failure) ([place]) 02/22/2022   CAP (community acquired pneumonia) 02/21/2022   Change in bowel habits 11/15/2021   Overweight (BMI 25.0-29.9) 06/03/2019   Acute respiratory failure with hypoxia ([place]) 06/03/2019   Hypothyroidism    COVID-19 virus infection 05/31/2019   Essential hypertension 05/31/2019   CKD (chronic kidney disease) 05/31/2019   Renal transplant, status post 05/31/2019   HLD (hyperlipidemia) 05/31/2019   H/O insulin dependent diabetes mellitus 05/31/2019   Chorea 05/31/2019   Diarrhea 09/02/2018   History of colonic polyps 10/25/2017   HCV infection 10/25/2017   Hepatitis C antibody positive in blood 03/12/2017   Elevated LFTs 03/12/2017    Orientation RESPIRATION BLADDER Height & Weight     Self, Situation, Time, Place  Normal Continent Weight: 107.7 kg Height:  6' (182.9 cm)  BEHAVIORAL  SYMPTOMS/MOOD NEUROLOGICAL BOWEL NUTRITION STATUS      Continent Diet (regular)  AMBULATORY STATUS COMMUNICATION OF NEEDS Skin   Extensive Assist   Normal                       Personal Care Assistance Level of Assistance  Bathing, Feeding, Dressing Bathing Assistance: Limited assistance Feeding assistance: Limited assistance Dressing Assistance: Limited assistance     Functional Limitations Info  Sight Sight Info: Adequate        SPECIAL CARE FACTORS FREQUENCY  PT (By licensed PT), OT (By licensed OT)     PT Frequency: 5 x weekly OT Frequency: 5 x weekly            Contractures Contractures Info: Not present    Additional Factors Info  Code Status Code Status Info: DNR             Current Medications (03/14/2022):  This is the current hospital active medication list Current Facility-Administered Medications  Medication Dose Route Frequency Provider Last Rate Last Admin   acetaminophen (TYLENOL) tablet 650 mg  650 mg Oral Q6H PRN Dahal, Marlowe Aschoff, MD       Or   acetaminophen (TYLENOL) suppository 650 mg  650 mg Rectal Q6H PRN Dahal, Binaya, MD       albuterol (PROVENTIL) (2.5 MG/3ML) 0.083% nebulizer solution 2.5 mg  2.5 mg Nebulization Q6H Dahal, Binaya, MD   2.5 mg at 03/14/22 0742   albuterol (PROVENTIL) (2.5 MG/3ML) 0.083% nebulizer solution 2.5 mg  2.5 mg Nebulization Q2H PRN Lilly Cove, RPH  2.5 mg at 03/11/22 1510   amLODipine (NORVASC) tablet 10 mg  10 mg Oral Daily Margaretha Seeds, MD   10 mg at 03/14/22 0914   atorvastatin (LIPITOR) tablet 10 mg  10 mg Oral Daily Dahal, Marlowe Aschoff, MD   10 mg at 03/14/22 0913   Chlorhexidine Gluconate Cloth 2 % PADS 6 each  6 each Topical Daily Dahal, Marlowe Aschoff, MD   6 each at 03/13/22 1034   Deutetrabenazine TABS 6 mg  6 mg Oral BID Madueme, Elvira C, RPH   6 mg at 03/13/22 2219   dexmedetomidine (PRECEDEX) 200 MCG/50ML (4 mcg/mL) infusion  0.2-0.6 mcg/kg/hr Intravenous Titrated Anders Simmonds, MD   Stopped at  03/14/22 0730   feeding supplement (ENSURE ENLIVE / ENSURE PLUS) liquid 237 mL  237 mL Oral BID BM Margaretha Seeds, MD   237 mL at 03/13/22 1512   haloperidol lactate (HALDOL) injection 1 mg  1 mg Intravenous Q3H PRN Anders Simmonds, MD       heparin injection 5,000 Units  5,000 Units Subcutaneous Q8H Terrilee Croak, MD   5,000 Units at 03/14/22 0558   hydrALAZINE (APRESOLINE) injection 10 mg  10 mg Intravenous Q6H PRN Terrilee Croak, MD   10 mg at 03/14/22 2952   hydrALAZINE (APRESOLINE) tablet 50 mg  50 mg Oral TID Margaretha Seeds, MD   50 mg at 03/14/22 0913   insulin aspart (novoLOG) injection 0-5 Units  0-5 Units Subcutaneous QHS Terrilee Croak, MD   2 Units at 03/10/22 2206   insulin aspart (novoLOG) injection 0-9 Units  0-9 Units Subcutaneous TID WC Terrilee Croak, MD   2 Units at 03/13/22 1707   insulin glargine-yfgn (SEMGLEE) injection 10 Units  10 Units Subcutaneous Daily Dahal, Marlowe Aschoff, MD   10 Units at 03/13/22 1034   levothyroxine (SYNTHROID) tablet 112 mcg  112 mcg Oral Q0600 Terrilee Croak, MD   112 mcg at 03/14/22 0557   lipase/protease/amylase (CREON) capsule 72,000 Units  72,000 Units Oral TID WC Terrilee Croak, MD   72,000 Units at 03/13/22 0838   melatonin tablet 3 mg  3 mg Oral QHS Dahal, Marlowe Aschoff, MD   3 mg at 03/13/22 2103   multivitamin (RENA-VIT) tablet 1 tablet  1 tablet Oral QHS Margaretha Seeds, MD   1 tablet at 03/13/22 2109   mycophenolate (MYFORTIC) EC tablet 180 mg  180 mg Oral BID Terrilee Croak, MD   180 mg at 03/14/22 0914   ondansetron (ZOFRAN) tablet 4 mg  4 mg Oral Q6H PRN Dahal, Marlowe Aschoff, MD       Or   ondansetron (ZOFRAN) injection 4 mg  4 mg Intravenous Q6H PRN Dahal, Marlowe Aschoff, MD       Oral care mouth rinse  15 mL Mouth Rinse 4 times per day Terrilee Croak, MD   15 mL at 03/14/22 0830   Oral care mouth rinse  15 mL Mouth Rinse PRN Dahal, Binaya, MD       polyethylene glycol (MIRALAX / GLYCOLAX) packet 17 g  17 g Oral Daily PRN Dahal, Binaya, MD       predniSONE  (DELTASONE) tablet 5 mg  5 mg Oral Q breakfast Dahal, Binaya, MD   5 mg at 03/14/22 0831   prismasol BGK 4/2.5 infusion   CRRT Continuous Rexene Agent, MD   Stopped at 03/13/22 1240   QUEtiapine (SEROQUEL) tablet 50 mg  50 mg Oral QHS Margaretha Seeds, MD       senna [facility])  tablet 8.6 mg  1 tablet Oral BID Terrilee Croak, MD   8.6 mg at 03/13/22 2104   tacrolimus (PROGRAF) capsule 1.5 mg  1.5 mg Oral BID Terrilee Croak, MD   1.5 mg at 03/14/22 1829     Discharge Medications: Please see discharge summary for a list of discharge medications.  Relevant Imaging Results:  Relevant Lab Results:   Additional Information [id number]  Leeroy Cha, RN

## 2022-03-14 NOTE — TOC Progression Note (Signed)
Transition of Care [facility]) - Progression Note    Patient Details  Name: Nathan Tyler MRN: [id number] Date of Birth: February 17, 1956  [facility]) CM/SW Contact  Leeroy Cha, RN Phone Number: 03/14/2022, 9:31 AM  Clinical Narrative:    Fl2 sent out to area snf for review.        Expected Discharge Plan and Services                                                 Social Determinants of Health (SDOH) Interventions    Readmission Risk Interventions   Row Labels 03/14/2022    9:17 AM 02/28/2022   10:24 AM  Readmission Risk Prevention Plan   Section Header. No data exists in this row.    Transportation Screening   Complete Complete  PCP or Specialist Appt within 5-7 Days    Complete  Home Care Screening    Complete  Medication Review (RN CM)    Complete  Medication Review Press photographer)   Complete   HRI or Home Care Consult   Complete   SW Recovery Care/Counseling Consult   Complete   Palliative Care Screening   Not [place]   Not Applicable

## 2022-03-14 NOTE — Progress Notes (Signed)
OT Cancellation Note  Patient Details Name: Nathan Tyler MRN: [id number] DOB: 05-25-1955   Cancelled Treatment:    Reason Eval/Treat Not Completed: Patient at procedure or test/ unavailable Patient off hall at this time. OT to continue to follow and check back as schedule will allow.  Rennie Plowman, MS Acute Rehabilitation Department Office# [phone] 03/14/2022, 12:47 PM

## 2022-03-14 NOTE — Consult Note (Signed)
NAME:  Nathan Tyler, MRN:  [id number], DOB:  January 23, 1956, LOS: 5 ADMISSION DATE:  03/09/2022, CONSULTATION DATE:  03/11/22 REFERRING MD:  Pietro Cassis, CHIEF COMPLAINT:  confusion   History of Present Illness:  79yM with history of renal transplant 2008 on IS (prograf, MMF, prednisone), CKD4 follows with Dr. Marval Regal, DM2 on insulin pump, HTN, hypothyroid, movement disorder who was recently hospitalized 11/1 through 11/8 for CAP, discharged home with home health RN, PT. After a couple days at home develoiped progressive weakness, no fever or worsening of cough. Went to [facility] 11/17 for dyspnea and found ot have O2 saturation of 80% on RA. Given neb and then sent to ED where he was found to have acute on chronic hypercapnic respiratory failure vbg 7.27/57, AKI on CKD, elevated BNP. Admitted to hospitalist service and started on levaquin for possible recurrent CAP, gentle IVF for AKI. All home IS continued here.   PCCM consulted 11/19 for AKI on CKD, worsening acute metabolic encephalopathy and uremia. Plan for likely CRRT.  Pertinent  Medical History  Renal transplant 2008 on IS CKD4 HTN DM2 on insulin pump Hypothyroid Movement disorder  Significant Hospital Events: Including procedures, antibiotic start and stop dates in addition to other pertinent events   11/17 admitted and started on levaquin, IVF  11/19 transfer to ICU for CRRT 11/21 CRRT discontinued with plan for HD. Lost HD access  Interim History / Subjective:   Yesterday CRRT discontinued with plan for HD. Lost HD access Overnight required precedex for sundowning and to protect prior HD site This morning awake and pleasant off of precedex  Objective   Blood pressure (!) 145/50, pulse (!) 57, temperature 97.8 F (36.6 C), temperature source Axillary, resp. rate 20, height 6' (1.829 m), weight 107.7 kg, SpO2 95 %.    FiO2 (%):  [40 %-50 %] 40 %   Intake/Output Summary (Last 24 hours) at 03/14/2022 0711 Last data filed at 03/14/2022  0700 Gross per 24 hour  Intake 190.53 ml  Output 1827 ml  Net -1636.47 ml   Filed Weights   03/12/22 0457 03/13/22 0500 03/14/22 0500  Weight: 117.9 kg 109.4 kg 107.7 kg   Physical Exam: General: Chronically ill-appearing, no acute distress, pleasant HENT: [place], AT, OP clear, MMM Eyes: EOMI, no scleral icterus Respiratory: Clear to auscultation bilaterally.  No crackles, wheezing or rales Cardiovascular: RRR, -M/R/G, no JVD GI: BS+, soft, nontender Extremities: 1+ pitting edema,-tenderness Neuro: Awake alert and follows commands  Imaging, labs and test noted above have been reviewed independently by me. BUN/Cr stable 48/2.47. Normal lytes Stable anemia  CXR 03/11/22 - Bibasilar pulmonary edema, small pleural effusions. R IJ in place Renal US 03/12/22 - Possible small cyst. Right lower quadrant renal transplant.  Resolved Hospital Problem list   Hyperkalemia, mild Hyperphosphatemia  Assessment & Plan:   Acute hypoxic respiratory failure 2/2 pulmonary edema/pleural effusions Acute on chronic hypercapnic respiratory failure Acute on chronic diastolic heart failure Low suspicion for infection. Antibiotics discontinued after admission day. - Wean HHFNC for goal >88-92% - BiPAP nightly - Will need transfer [facility] for [facility]HD per Nephrology. Plan for tunneled cath  Acute metabolic encephalopathy - improving Movement disorder  Agitation  - Weaned off Precedex - Increase seroquel nightly 50 mg - Continue home Deutetrabenazine BID - Re-orient   AKI on CKD4 - improving Uremia - improving Renal transplant 2008 - Plan for iHD per Nephrology. No acute needs for dialysis today - Nephrology ordered tunneled cath - Continue myfortic, prograf and prednisone  5 mg daily  Anemia of chronic disease - trend cbc  DM2  - basal/bolus - insulin pump off  Acute on chronic diastolic heart failure - improving HTN - Continue home amlodipine, hydralazine - Volume removal with  dialysis  Hypothyroid - home synthroid  DNR status  Primary team updated on plan. Weaning off precedex. Once patient has tunneled cath, ok to transfer to [facility] from CCM standpoint. PCCM available as needed.  Best Practice (right click and "Reselect all SmartList Selections" daily)   Diet/type: Regular consistency (see orders) DVT prophylaxis: prophylactic heparin  GI prophylaxis: N/A Lines: Dialysis Catheter Foley:  Yes, and it is still needed Code Status:  DNR Last date of multidisciplinary goals of care discussion [pt and sister updated at bedside]  Critical care time: 30 min   The patient is has agitation with sedation and requires high complexity decision making for assessment and support, frequent evaluation and titration of therapies, application of advanced monitoring technologies and extensive interpretation of multiple databases.  Independent Critical Care Time: 30 Minutes.   Rodman Pickle, M.D. [facility] Pulmonary/Critical Care Medicine 03/14/2022 7:11 AM   Please see Amion for pager number to reach on-call Pulmonary and Critical Care Team.

## 2022-03-14 NOTE — Consult Note (Signed)
Chief Complaint: Patient was seen in consultation today for AKI on CKD stage 4 with renal transplant 2008 at the request of Pearson Grippe   Referring Physician(s): Pearson Grippe  Supervising Physician: Markus Daft  Patient Status: [facility] - In-pt  History of Present Illness: GABERIAL CADA is a 66 y.o. male with PMH of renal transplant 2008, CKD 4 followed by Dr. Marval Regal, DM type II, HTN and movement disorder.  Patient was recently hospitalized 11/1 through 11/8 and discharged home with home health.  A few days later he presented to urgent care on 11/17 complaining of weakness and worsening cough.  Was referred to ED where he was found to have acute on chronic hypercapnic respiratory failure, AKI on CKD and elevated BNP.  Patient was admitted for recurrent CAP and started on Levaquin.  Critical care was consulted 11/19 for AKI on CKD with worsening acute metabolic encephalopathy and uremia.  Patient was transferred to ICU 11/19 and started on CRRT.  Patient had temporary left IJ catheter for CRRT but pulled out due to confusion.  Has been referred to IR for tunneled hemodialysis catheter placement for HD.   Past Medical History:  Diagnosis Date   Chorea    Diabetes mellitus without complication ([place])    Essential hypertension    Hypercholesteremia    Hypothyroidism    Neuromuscular disorder (HCC)    PONV (postoperative nausea and vomiting)    Renal failure    Has had kidney transplant 2008    Past Surgical History:  Procedure Laterality Date   AMPUTATION Right 06/27/2016   Procedure: AMPUTATION 5TH TOE RIGHT FOOT;  Surgeon: Caprice Beaver, DPM;  Location: [facility];  Service: Podiatry;  Laterality: Right;   AMPUTATION TOE Left 09/02/2015   Procedure: AMPUTATION  2ND TOE LEFT FOOT;  Surgeon: Aviva Signs, MD;  Location: [facility];  Service: General;  Laterality: Left;   BIOPSY  11/24/2021   Procedure: BIOPSY;  Surgeon: Daneil Dolin, MD;  Location: [facility] END[facility];  Service: Endoscopy;;    CATARACT EXTRACTION Bilateral    COLONOSCOPY  01/2014   done in [place]. patient reports h/o colon polyps on TCS in 2010, 2015 colonoscopy normal but advised to come back in 2020.    COLONOSCOPY WITH PROPOFOL N/A 01/19/2019   Procedure: COLONOSCOPY WITH PROPOFOL;  Surgeon: Daneil Dolin, MD;  Location: [facility];  Service: Endoscopy;  Laterality: N/A;  9:45am   COLONOSCOPY WITH PROPOFOL N/A 11/24/2021   Procedure: COLONOSCOPY WITH PROPOFOL;  Surgeon: Daneil Dolin, MD;  Location: [facility];  Service: Endoscopy;  Laterality: N/A;  2:15pm   KIDNEY TRANSPLANT Right 2010   POLYPECTOMY  01/19/2019   Procedure: POLYPECTOMY;  Surgeon: Daneil Dolin, MD;  Location: [facility];  Service: Endoscopy;;   POLYPECTOMY  11/24/2021   Procedure: POLYPECTOMY;  Surgeon: Daneil Dolin, MD;  Location: [facility];  Service: Endoscopy;;    Allergies: Patient has no known allergies.  Medications: Prior to Admission medications   Medication Sig Start Date End Date Taking? Authorizing Provider  amLODipine (NORVASC) 10 MG tablet Take 10 mg by mouth daily. 12/28/21  Yes [provider]  atorvastatin (LIPITOR) 10 MG tablet Take 10 mg by mouth daily.   Yes [provider]  carvedilol (COREG) 12.5 MG tablet Take 12.5 mg by mouth 2 (two) times daily. 12/29/21  Yes [provider]  Deutetrabenazine (AUSTEDO) 6 MG TABS Take 6 mg by mouth 2 (two) times daily.   Yes  [provider]  furosemide (LASIX) 20 MG tablet Take 20 mg by mouth daily.    Yes [provider]  hydrALAZINE (APRESOLINE) 50 MG tablet Take 50 mg by mouth 3 (three) times daily. 02/06/22  Yes [provider]  Insulin Disposable Pump (OMNIPOD DASH PODS, GEN 4,) MISC Inject 5 Units into the skin 3 (three) times daily. Medication: Novolog family member states usually runs about 5 units TID depending on his sliding scale 02/19/22  Yes [provider]  levothyroxine (SYNTHROID) 112 MCG  tablet Take 112 mcg by mouth daily. 02/20/22  Yes [provider]  lipase/protease/amylase (CREON) 36000 UNITS CPEP capsule Take 2 capsules (72,000 Units total) by mouth 3 (three) times daily with meals. Take 1 with snacks 11/15/21  Yes Annitta Needs, NP  mycophenolate (MYFORTIC) 180 MG EC tablet Take 1 tablet (180 mg total) by mouth 2 (two) times daily. 02/28/22  Yes Charlynne Cousins, MD  predniSONE (DELTASONE) 5 MG tablet Take 1 tablet (5 mg total) by mouth daily with breakfast. restart '5mg'$  dose 3/10 after completing prednisone taper (2/24 - 3/9) 06/16/19  Yes Patrecia Pour, MD  sodium bicarbonate 650 MG tablet Take 650 mg by mouth 2 (two) times daily. 11/18/21  Yes [provider]  tacrolimus (PROGRAF) 0.5 MG capsule Take 1.5 mg by mouth 2 (two) times daily.    Yes [provider]  Dulaglutide (TRULICITY) 1.5 KK/9.3GH SOPN Inject 1.5 mg into the skin every 7 (seven) days.     [provider]  oxyCODONE (ROXICODONE) 5 MG immediate release tablet Take 1 tablet (5 mg total) by mouth every 6 (six) hours as needed for up to 12 doses for severe pain. Patient not taking: Reported on 03/09/2022 12/21/21   Fransico Meadow, MD     Family History  Problem Relation Age of Onset   Colon cancer Maternal Aunt 75       [facility]    Social History   Socioeconomic History   Marital status: Widowed    Spouse name: Not on file   Number of children: Not on file   Years of education: Not on file   Highest education level: Not on file  Occupational History   Not on file  Tobacco Use   Smoking status: Never    Passive exposure: Never   Smokeless tobacco: Never  Vaping Use   Vaping Use: Never used  Substance and Sexual Activity   Alcohol use: Yes    Alcohol/week: 0.0 standard drinks of alcohol    Comment: As of 09/02/2018: Glass of wine 1-2 times a month   Drug use: No   Sexual activity: Yes    Birth control/protection: None  Other Topics Concern   Not on  file  Social History Narrative   Not on file   Social Determinants of Health   Financial Resource Strain: Not on file  Food Insecurity: No Food Insecurity (03/09/2022)   Hunger Vital Sign    Worried About Running Out of Food in the Last Year: Never true    Ran Out of Food in the Last Year: Never true  Transportation Needs: No Transportation Needs (03/09/2022)   PRAPARE - Hydrologist (Medical): No    Lack of Transportation (Non-Medical): No  Physical Activity: Not on file  Stress: Not on file  Social Connections: Not on file    Review of Systems: A 12 point ROS discussed and pertinent positives are indicated in  the HPI above.  All other systems are negative.  Review of Systems  Unable to perform ROS: Mental status change    Vital Signs: BP (!) 145/50 (BP Location: Right Arm)   Pulse (!) 57   Temp 97.8 F (36.6 C) (Axillary)   Resp 20   Ht 6' (1.829 m)   Wt 237 lb 7 oz (107.7 kg)   SpO2 99%   BMI 32.20 kg/m      Physical Exam Vitals reviewed.  Constitutional:      General: He is not in acute distress.    Appearance: He is ill-appearing.  HENT:     Head: Normocephalic and atraumatic.  Cardiovascular:     Rate and Rhythm: Regular rhythm. Bradycardia present.     Pulses: Normal pulses.     Heart sounds: Normal heart sounds.  Pulmonary:     Effort: Pulmonary effort is normal.     Breath sounds: Rhonchi present.     Comments: Rhonchi throughout Abdominal:     General: Bowel sounds are normal. There is no distension.     Palpations: Abdomen is soft.     Tenderness: There is no abdominal tenderness. There is no guarding.  Musculoskeletal:     Right lower leg: Edema present.     Left lower leg: Edema present.  Skin:    General: Skin is warm and dry.  Neurological:     Mental Status: He is disoriented.     Imaging: US Renal Transplant w/Doppler  Result Date: 03/12/2022 CLINICAL DATA:  Acute kidney injury. EXAM: ULTRASOUND OF  RENAL TRANSPLANT WITH RENAL DOPPLER ULTRASOUND TECHNIQUE: Ultrasound examination of the renal transplant was performed with gray-scale, color and duplex doppler evaluation. COMPARISON:  Renal ultrasound 12/27/2021 FINDINGS: Transplant kidney location: Right lower quadrant Transplant Kidney: Renal measurements: 11.2 x 6.7 x 5.1 cm = volume: 200 mL. Normal in size and parenchymal echogenicity. No hydronephrosis. Possible 1.6 cm cyst peripherally. No peri-transplant fluid collection seen. Color flow in the main renal artery:  Yes Color flow in the main renal vein:  Yes Duplex Doppler Evaluation: Main Renal Artery Velocity: 84 cm/sec Main Renal Artery Resistive Index: 0.8 Venous waveform in main renal vein:  Present. Intrarenal resistive index in upper pole:  0.7 (normal 0.6-0.8; equivocal 0.8-0.9; abnormal >= 0.9) Intrarenal resistive index in lower pole: 0.7 (normal 0.6-0.8; equivocal 0.8-0.9; abnormal >= 0.9) Bladder: Decompressed by Foley catheter. Other findings:  None. IMPRESSION: Possible small cyst. Otherwise unremarkable sonographic appearance of the right lower quadrant renal transplant. No transplant hydronephrosis. Electronically Signed   By: Keith Rake M.D.   On: 03/12/2022 15:47   DG CHEST PORT 1 VIEW  Result Date: 03/11/2022 CLINICAL DATA:  Central line placement. EXAM: PORTABLE CHEST 1 VIEW COMPARISON:  Radiograph 03/09/2022, CT 02/22/2022 FINDINGS: Right internal jugular central venous catheter tip projects over the right clavicle. This is in the region of the distal jugular vein or brachiocephalic confluence. No pneumothorax. Low lung volumes. Similar appearance of heterogeneous bilateral lung opacities. Stable cardiomegaly. Suspected small pleural effusions. IMPRESSION: 1. Right internal jugular central venous catheter with tip projecting over the right clavicle, in the region of the distal jugular vein or brachiocephalic confluence. No pneumothorax. 2. Stable bilateral heterogeneous  opacities which may represent pulmonary edema, infection or combination there of. Stable cardiomegaly and pleural effusions. Electronically Signed   By: Keith Rake M.D.   On: 03/11/2022 17:13   ECHOCARDIOGRAM COMPLETE  Result Date: 03/10/2022    ECHOCARDIOGRAM REPORT   Patient Name:  [name] Date of Exam: 03/10/2022 Medical Rec #:  [id number]    Height:       72.0 in Accession #:    [id number]   Weight:       229.3 lb Date of Birth:  1955-11-16    BSA:          2.258 m Patient Age:    34 years     BP:           144/67 mmHg Patient Gender: M            HR:           67 bpm. Exam Location:  Inpatient Procedure: 2D Echo, Cardiac Doppler and Color Doppler Indications:    CHF  History:        Patient has prior history of Echocardiogram examinations, most                 recent 06/08/2019. Arrythmias:Abnormal ECG.  Sonographer:    Melissa Morford RDCS (AE, PE) Referring Phys: [id number] [place]  1. Left ventricular ejection fraction, by estimation, is 55 to 60%. The left ventricle has normal function. The left ventricle has no regional wall motion abnormalities. Left ventricular diastolic parameters are consistent with Grade II diastolic dysfunction (pseudonormalization). Elevated left ventricular end-diastolic pressure.  2. Right ventricular systolic function is mildly reduced. The right ventricular size is mildly enlarged.  3. Left atrial size was moderately dilated.  4. The mitral valve is abnormal. Trivial mitral valve regurgitation. No evidence of mitral stenosis.  5. The aortic valve is tricuspid. There is mild calcification of the aortic valve. There is mild thickening of the aortic valve. Aortic valve regurgitation is not visualized. Aortic valve sclerosis is present, with no evidence of aortic valve stenosis.  6. The inferior vena cava is normal in size with greater than 50% respiratory variability, suggesting right atrial pressure of 3 mmHg. FINDINGS  Left Ventricle: Left ventricular  ejection fraction, by estimation, is 55 to 60%. The left ventricle has normal function. The left ventricle has no regional wall motion abnormalities. The left ventricular internal cavity size was normal in size. There is  no left ventricular hypertrophy. Left ventricular diastolic parameters are consistent with Grade II diastolic dysfunction (pseudonormalization). Elevated left ventricular end-diastolic pressure. Right Ventricle: The right ventricular size is mildly enlarged. Right vetricular wall thickness was not assessed. Right ventricular systolic function is mildly reduced. Left Atrium: Left atrial size was moderately dilated. Right Atrium: Right atrial size was normal in size. Pericardium: There is no evidence of pericardial effusion. Mitral Valve: The mitral valve is abnormal. There is mild thickening of the mitral valve leaflet(s). Trivial mitral valve regurgitation. No evidence of mitral valve stenosis. Tricuspid Valve: The tricuspid valve is normal in structure. Tricuspid valve regurgitation is trivial. No evidence of tricuspid stenosis. Aortic Valve: The aortic valve is tricuspid. There is mild calcification of the aortic valve. There is mild thickening of the aortic valve. Aortic valve regurgitation is not visualized. Aortic valve sclerosis is present, with no evidence of aortic valve stenosis. Pulmonic Valve: The pulmonic valve was normal in structure. Pulmonic valve regurgitation is trivial. No evidence of pulmonic stenosis. Aorta: The aortic root is normal in size and structure. Venous: The inferior vena cava is normal in size with greater than 50% respiratory variability, suggesting right atrial pressure of 3 mmHg. IAS/Shunts: No atrial level shunt detected by color flow Doppler.  LEFT VENTRICLE PLAX 2D LVIDd:  5.50 cm   Diastology LVIDs:         3.80 cm   LV e' medial:    58.71 cm/s LV PW:         1.10 cm   LV E/e' medial:  1.9 LV IVS:        1.10 cm   LV e' lateral:   6.53 cm/s LVOT diam:      2.20 cm   LV E/e' lateral: 17.3 LV SV:         60 LV SV Index:   27 LVOT Area:     3.80 cm  RIGHT VENTRICLE RV S prime:     12.70 cm/s TAPSE (M-mode): 2.3 cm LEFT ATRIUM             Index        RIGHT ATRIUM           Index LA diam:        4.70 cm 2.08 cm/m   RA Area:     17.20 cm LA Vol (A2C):   53.4 ml 23.65 ml/m  RA Volume:   43.70 ml  19.35 ml/m LA Vol (A4C):   48.8 ml 21.61 ml/m LA Biplane Vol: 53.0 ml 23.47 ml/m  AORTIC VALVE LVOT Vmax:   86.70 cm/s LVOT Vmean:  60.500 cm/s LVOT VTI:    0.158 m  AORTA Ao Root diam: 3.30 cm Ao Asc diam:  3.40 cm MITRAL VALVE                TRICUSPID VALVE MV Area (PHT): 4.31 cm     TR Peak grad:   17.3 mmHg MV Decel Time: 176 msec     TR Vmax:        208.00 cm/s MV E velocity: 113.00 cm/s MV A velocity: 71.10 cm/s   SHUNTS MV E/A ratio:  1.59         Systemic VTI:  0.16 m                             Systemic Diam: 2.20 cm Jenkins Rouge MD Electronically signed by Jenkins Rouge MD Signature Date/Time: 03/10/2022/11:48:19 AM    Final    DG Chest 2 View  Result Date: 03/09/2022 CLINICAL DATA:  SOB EXAM: CHEST - 2 VIEW COMPARISON:  02/21/22 FINDINGS: Very low lung volumes. There bilateral patchy airspace opacities which may represent a combination of bronchovascular crowding, pulmonary edema, and possible multifocal infection. Possible trace left pleural effusion. Cardiac and mediastinal contours are enlarged and likely unchanged when accounting for differences in lung volumes. Visualized upper abdomen is unremarkable. The lateral views essentially nondiagnostic, but the vertebral body heights appear maintained. IMPRESSION: Very low lung volumes. Bilateral patchy airspace opacities may represent a combination of bronchovascular crowding, pulmonary edema, and possible multifocal infection. Electronically Signed   By: Marin Roberts M.D.   On: 03/09/2022 13:28   CT Chest Wo Contrast  Result Date: 02/22/2022 CLINICAL DATA:  Dyspnea, pneumonia EXAM: CT CHEST WITHOUT  CONTRAST TECHNIQUE: Multidetector CT imaging of the chest was performed following the standard protocol without IV contrast. RADIATION DOSE REDUCTION: This exam was performed according to the departmental dose-optimization program which includes automated exposure control, adjustment of the mA and/or kV according to patient size and/or use of iterative reconstruction technique. COMPARISON:  06/03/2019 FINDINGS: Cardiovascular: Extensive multi-vessel coronary artery calcification. Global cardiac size within normal limits. No pericardial effusion. Central pulmonary arteries are mildly enlarged  in keeping with changes of pulmonary arterial hypertension. Mild atherosclerotic calcification within the thoracic aorta. No aortic aneurysm. Mediastinum/Nodes: No enlarged mediastinal or axillary lymph nodes. Thyroid gland, trachea, and esophagus demonstrate no significant findings. Lungs/Pleura: Small bilateral pleural effusions are present with associated bibasilar atelectasis. There are bilateral basilar predominant ground-glass and reticular pulmonary infiltrates which appear distributed in the areas most severely affected on prior CT examination and likely represent residual post inflammatory scarring. There is, however, more focal consolidative infiltrate within the right lower lobe, axial image # 116/5, which appears new from prior examination may represent an acute infectious process. No pneumothorax. No central obstructing lesion. Upper Abdomen: No acute abnormality. Musculoskeletal: Multiple remote bilateral rib fractures are identified. No acute bone abnormality. No lytic or blastic bone lesion. IMPRESSION: 1. Extensive multi-vessel coronary artery calcification. 2. Small bilateral pleural effusions with associated bibasilar atelectasis. 3. Bilateral basilar predominant ground-glass and reticular pulmonary infiltrates which appear distributed in the areas most severely affected on prior CT examination and likely  represent residual post inflammatory scarring. 4. More focal consolidative infiltrate within the right lower lobe, which appears new from prior examination may represent a superimposed acute infectious process. Follow-up examination 3 months would be helpful to document resolution. 5. Morphologic changes in keeping with pulmonary arterial hypertension. Aortic Atherosclerosis (ICD10-I70.0). Electronically Signed   By: Fidela Salisbury M.D.   On: 02/22/2022 02:56   CT Head Wo Contrast  Result Date: 02/22/2022 CLINICAL DATA:  Mental status change, unknown cause EXAM: CT HEAD WITHOUT CONTRAST TECHNIQUE: Contiguous axial images were obtained from the base of the skull through the vertex without intravenous contrast. RADIATION DOSE REDUCTION: This exam was performed according to the departmental dose-optimization program which includes automated exposure control, adjustment of the mA and/or kV according to patient size and/or use of iterative reconstruction technique. COMPARISON:  12/27/2021 FINDINGS: Brain: Normal anatomic configuration. Parenchymal volume loss is stable, though slightly advanced given the patient's age. Mild periventricular white matter changes are present likely reflecting the sequela of small vessel ischemia. Remote lacunar infarct within the anterior limb of the left internal capsule. No abnormal intra or extra-axial mass lesion or fluid collection. No abnormal mass effect or midline shift. No evidence of acute intracranial hemorrhage or infarct. Ventricular size is normal. Cerebellum unremarkable. Vascular: No asymmetric hyperdense vasculature at the skull base. Skull: Intact Sinuses/Orbits: Left maxillary mucosal thickening noted. No air-fluid level. Remaining paranasal sinuses are clear. Orbits are unremarkable. Other: Mastoid air cells and middle ear cavities are clear. IMPRESSION: 1. No acute intracranial hemorrhage or infarct. 2. Stable mild senescent change. 3. Mild left maxillary sinus  disease. Electronically Signed   By: Fidela Salisbury M.D.   On: 02/22/2022 02:47   DG Chest Port 1 View  Result Date: 02/21/2022 CLINICAL DATA:  Shortness of breath, dyspnea.  Hypoglycemia. EXAM: PORTABLE CHEST 1 VIEW COMPARISON:  PA Lat 07/16/2019 FINDINGS: Numerous overlying monitor wires. There is cardiomegaly and mild central vascular distension without overt edema. Heterogeneous opacities in the mid to lower lung fields with peripheral architectural distortion, are probably due to postinflammatory scarring but there is increased patchy haziness in the lung bases concerning for superimposed pneumonia. No pleural effusion is seen. The upper lung fields are generally clear Stable mediastinal widening probably due to lipomatosis. Thoracic spondylosis. IMPRESSION: 1. Increased patchy haziness in the lung bases concerning for pneumonia. Clinical correlation and radiographic follow-up recommended. 2. Cardiomegaly and mild central vascular distension without overt edema. 3. Chronic scarring in the mid to lower lung  fields. Electronically Signed   By: Telford Nab M.D.   On: 02/21/2022 21:04    Labs:  CBC: Recent Labs    03/11/22 0244 03/12/22 0301 03/13/22 0658 03/14/22 0726  WBC 8.3 7.8 6.7 5.7  HGB 7.3* 8.0* 8.5* 8.1*  HCT 25.3* 27.5* 27.7* 26.9*  PLT 238 222 186 184    COAGS: Recent Labs    02/22/22 0829 03/12/22 0301 03/13/22 0658 03/14/22 0726  INR 1.1  --   --   --   APTT  --  24 43* 38*    BMP: Recent Labs    03/12/22 1601 03/13/22 0658 03/13/22 1600 03/14/22 0726  NA 139 139 140 143  K 4.0 3.9 4.0 3.8  CL 108 106 111 112*  CO2 '23 24 25 26  '$ GLUCOSE 140* 131* 194* 135*  BUN 61* 45* 48* 50*  CALCIUM 7.5* 7.7* 7.7* 8.0*  CREATININE 2.88* 2.22* 2.47* 2.61*  GFRNONAA 23* 31* 28* 26*    LIVER FUNCTION TESTS: Recent Labs    12/27/21 1823 02/21/22 2048 02/22/22 0209 03/09/22 1240 03/12/22 0301 03/12/22 1601 03/13/22 0658 03/13/22 1600 03/14/22 0726  BILITOT  0.5 0.4 0.3 0.6  --   --   --   --   --   AST '18 16 16 24  '$ --   --   --   --   --   ALT '11 15 16 22  '$ --   --   --   --   --   ALKPHOS 71 72 74 75  --   --   --   --   --   PROT 5.7* 5.4* 5.5* 5.6*  --   --   --   --   --   ALBUMIN 3.3* 3.3* 3.3* 3.6   < > 3.2* 3.2* 3.1* 2.9*   < > = values in this interval not displayed.    TUMOR MARKERS: No results for input(s): "AFPTM", "CEA", "CA199", "CHROMGRNA" in the last 8760 hours.  Assessment and Plan:  66 year old male with history of renal transplant [place] to IR for tunneled dialysis catheter for HD due to AKI on CKD.  Pt resting in bed with daughter at bedside. He is in no distress.  Daughter states pt has been confused and does not realize that she is there.  RN reports pt has been NPO  per order.    Risks and benefits discussed with the patient including, but not limited to bleeding, infection, vascular injury, pneumothorax which may require chest tube placement, air embolism or even death  All of the patient's questions were answered, patient is agreeable to proceed. Consent signed and in chart.  Thank you for this interesting consult.  I greatly enjoyed meeting VESTAL MARKIN and look forward to participating in their care.  A copy of this report was sent to the requesting provider on this date.  Electronically Signed: Tyson Alias, NP 03/14/2022, 8:18 AM   I spent a total of 20 minutes in face to face in clinical consultation, greater than 50% of which was counseling/coordinating care for AKI on CKD stage IV.

## 2022-03-15 DIAGNOSIS — N184 Chronic kidney disease, stage 4 (severe): Secondary | ICD-10-CM | POA: Diagnosis not present

## 2022-03-15 DIAGNOSIS — N179 Acute kidney failure, unspecified: Secondary | ICD-10-CM | POA: Diagnosis not present

## 2022-03-15 LAB — RENAL FUNCTION PANEL
Albumin: 2.7 g/dL — ABNORMAL LOW (ref 3.5–5.0)
Albumin: 2.9 g/dL — ABNORMAL LOW (ref 3.5–5.0)
Anion gap: 4 — ABNORMAL LOW (ref 5–15)
Anion gap: 9 (ref 5–15)
BUN: 56 mg/dL — ABNORMAL HIGH (ref 8–23)
BUN: 52 mg/dL — ABNORMAL HIGH (ref 8–23)
CO2: 26 mmol/L (ref 22–32)
CO2: 25 mmol/L (ref 22–32)
Calcium: 7.8 mg/dL — ABNORMAL LOW (ref 8.9–10.3)
Calcium: 8 mg/dL — ABNORMAL LOW (ref 8.9–10.3)
Chloride: 109 mmol/L (ref 98–111)
Chloride: 105 mmol/L (ref 98–111)
Creatinine, Ser: 3.37 mg/dL — ABNORMAL HIGH (ref 0.61–1.24)
Creatinine, Ser: 3.53 mg/dL — ABNORMAL HIGH (ref 0.61–1.24)
GFR, Estimated: 19 mL/min — ABNORMAL LOW (ref >60)
GFR, Estimated: 18 mL/min — ABNORMAL LOW (ref >60)
Glucose, Bld: 200 mg/dL — ABNORMAL HIGH (ref 70–99)
Glucose, Bld: 260 mg/dL — ABNORMAL HIGH (ref 70–99)
Phosphorus: 3.9 mg/dL (ref 2.5–4.6)
Phosphorus: 3.5 mg/dL (ref 2.5–4.6)
Potassium: 3.9 mmol/L (ref 3.5–5.1)
Potassium: 4.4 mmol/L (ref 3.5–5.1)
Sodium: 139 mmol/L (ref 135–145)
Sodium: 139 mmol/L (ref 135–145)

## 2022-03-15 LAB — GLUCOSE, CAPILLARY
Glucose-Capillary: 180 mg/dL — ABNORMAL HIGH (ref 70–99)
Glucose-Capillary: 177 mg/dL — ABNORMAL HIGH (ref 70–99)
Glucose-Capillary: 195 mg/dL — ABNORMAL HIGH (ref 70–99)
Glucose-Capillary: 264 mg/dL — ABNORMAL HIGH (ref 70–99)
Glucose-Capillary: 307 mg/dL — ABNORMAL HIGH (ref 70–99)

## 2022-03-15 LAB — TACROLIMUS LEVEL: Tacrolimus (FK506) - LabCorp: 5.8 ng/mL (ref 2.0–20.0)

## 2022-03-15 LAB — CBC
HCT: 25.9 % — ABNORMAL LOW (ref 39.0–52.0)
Hemoglobin: 7.5 g/dL — ABNORMAL LOW (ref 13.0–17.0)
MCH: 28.8 pg (ref 26.0–34.0)
MCHC: 29 g/dL — ABNORMAL LOW (ref 30.0–36.0)
MCV: 99.6 fL (ref 80.0–100.0)
Platelets: 185 10*3/uL (ref 150–400)
RBC: 2.6 MIL/uL — ABNORMAL LOW (ref 4.22–5.81)
RDW: 15.2 % (ref 11.5–15.5)
WBC: 6 10*3/uL (ref 4.0–10.5)
nRBC: 0 % (ref 0.0–0.2)

## 2022-03-15 LAB — MAGNESIUM: Magnesium: 2.3 mg/dL (ref 1.7–2.4)

## 2022-03-15 MED ORDER — NAPHAZOLINE-GLYCERIN 0.012-0.25 % OP SOLN
1.0000 [drp] | Freq: Four times a day (QID) | OPHTHALMIC | Status: DC | PRN
  Administered 2022-03-15: 2 [drp] via OPHTHALMIC
  Administered 2022-03-15 – 2022-03-17 (×3): 1 [drp] via OPHTHALMIC
  Filled 2022-03-15: qty 15

## 2022-03-15 MED ORDER — ACETAMINOPHEN 325 MG PO TABS: 650.0000 mg | ORAL_TABLET | Freq: Four times a day (QID) | ORAL | Status: DC | PRN

## 2022-03-15 NOTE — Progress Notes (Signed)
Patient was confused and agitated. Also refusing blood works, CBG and care at this time. [facility]/ A. Zebedee Iba NP was notified.

## 2022-03-15 NOTE — Progress Notes (Signed)
Nathan Tyler  CHE:[id number] DOB: 1955/06/15 DOA: 03/09/2022 PCP: Celene Squibb, MD    Brief Narrative:  66 year old with a history of CKD status post transplant, chorea, DM2, HTN, HLD, and hypothyroidism who presented to the hospital 11/17 with shortness of breath following a hospitalization 11/1 > 11/8 with community-acquired pneumonia.  His evaluation in the ER was most suggestive of volume overload and he was found to be suffering with acutely worsening renal failure.  Consultants:  Nephrology IR  Goals of Care:  Code Status: DNR   DVT prophylaxis: Subcutaneous heparin  Interim Hx: Afebrile.  Vital signs stable with blood pressure mildly elevated.  Saturation 97-100% on 2 L nasal cannula.  Feeling much better today.  Enjoying his [place] dinner.  Denies chest pain.  Assessment & Plan:  Acute kidney failure on CKD stage IV in a transplant kidney Baseline creatinine 3-4 - presented with creatinine at 5.9 - tunneled dialysis catheter placed 11/22 by IR - CRRT was being administered at [facility] -transferred to [facility] for full hemodialysis  Acute hypoxic respiratory failure As evidenced by significant dyspnea and requirement for 4 L nasal cannula to keep saturations at 90% -CXR at presentation consistent with pulmonary edema -has improved with CRRT  Acute metabolic encephalopathy due to sundowning, hypoxia, and renal failure Required Precedex during initial portion of hospital stay -utilizing low-dose Haldol with close monitoring for worsening of chorea -mental status much improved/stable today  Status post renal transplant 2008 -chronically immunosuppressed On tacrolimus, mycophenolate, and low-dose prednisone chronically  Anemia of chronic kidney disease No evidence of acute blood loss at present  DM2 with hyperglycemia A1c 5.7 -likely due to use of Trulicity in the setting of worsening renal function -CBG presently controlled  HTN Blood pressure currently controlled  without medical therapy  HLD Continue Lipitor  Hypothyroidism Continue usual dose of Synthroid  Chorea Continue home dose of Austedo  Family Communication: No family present Disposition:     Objective: Blood pressure (!) 151/57, pulse 73, temperature 97.9 F (36.6 C), temperature source Oral, resp. rate 20, height '5\' 11"'$  (1.803 m), weight 109.4 kg, SpO2 97 %.  Intake/Output Summary (Last 24 hours) at 03/15/2022 1114 Last data filed at 03/15/2022 0540 Gross per 24 hour  Intake 1220 ml  Output 775 ml  Net 445 ml   Filed Weights   03/14/22 0500 03/15/22 0500 03/15/22 0625  Weight: 107.7 kg 112.4 kg 109.4 kg    Examination: General: No acute respiratory distress Lungs: Clear to auscultation bilaterally without wheezes or crackles Cardiovascular: Regular rate and rhythm without murmur gallop or rub normal S1 and S2 Abdomen: Nontender, nondistended, soft, bowel sounds positive, no rebound, no ascites, no appreciable mass Extremities: No significant cyanosis, clubbing, or edema bilateral lower extremities  CBC: Recent Labs  Lab 03/13/22 0658 03/14/22 0726 03/15/22 0544  WBC 6.7 5.7 6.0  HGB 8.5* 8.1* 7.5*  HCT 27.7* 26.9* 25.9*  MCV 97.2 98.5 99.6  PLT 186 184 536   Basic Metabolic Panel: Recent Labs  Lab 03/13/22 0658 03/13/22 1600 03/14/22 0726 03/14/22 1021 03/15/22 0544  NA 139   < > 143 142 139  K 3.9   < > 3.8 3.9 3.9  CL 106   < > 112* 111 109  CO2 24   < > '26 26 26  '$ GLUCOSE 131*   < > 135* 130* 200*  BUN 45*   < > 50* 49* 56*  CREATININE 2.22*   < > 2.61* 2.78* 3.37*  CALCIUM 7.7*   < > 8.0* 8.1* 7.8*  MG 2.5*  --  2.6*  --  2.3  PHOS 2.6   < > 3.7 3.7 3.9   < > = values in this interval not displayed.   GFR: Estimated Creatinine Clearance: 26.4 mL/min (A) (by C-G formula based on SCr of 3.37 mg/dL (H)).   Scheduled Meds:  albuterol  2.5 mg Nebulization Q6H   amLODipine  10 mg Oral Daily   atorvastatin  10 mg Oral Daily   Chlorhexidine  Gluconate Cloth  6 each Topical Daily   Deutetrabenazine  6 mg Oral BID   feeding supplement  237 mL Oral BID BM   heparin  5,000 Units Subcutaneous Q8H   hydrALAZINE  50 mg Oral TID   insulin aspart  0-5 Units Subcutaneous QHS   insulin aspart  0-9 Units Subcutaneous TID WC   levothyroxine  112 mcg Oral Q0600   lipase/protease/amylase  72,000 Units Oral TID WC   melatonin  3 mg Oral QHS   multivitamin  1 tablet Oral QHS   mycophenolate  180 mg Oral BID   mouth rinse  15 mL Mouth Rinse 4 times per day   predniSONE  5 mg Oral Q breakfast   QUEtiapine  50 mg Oral QHS   senna  1 tablet Oral BID   tacrolimus  1.5 mg Oral BID   Continuous Infusions:  prismasol BGK 4/2.5 Stopped (03/13/22 1240)     LOS: 6 days   [name], MD [facility] Office  [phone] Pager - Text Page per Shea Evans  If 7PM-7AM, please contact night-coverage per Amion 03/15/2022, 11:14 AM

## 2022-03-15 NOTE — Progress Notes (Signed)
Came to the unit with transporter from [facility] ICU. Alert and oriented x 4. With intact  IV access on left forearm to SL and HD Cath on right chest. With indwelling foley catheter to urine bag. Breathing is regular and unlabored. No chest pain. SR on monitor. Call bell within reach. Bed alarm on. Family aware regarding the transfer. Patient placement informed.

## 2022-03-15 NOTE — Progress Notes (Signed)
[facility] Progress Note  Subjective:  R IJ TDC yesterday with IR Moved to [facility] overnight In good spirits this AM 0.8L UOP yesterday SCr inc to 3.4, K 3.9, HCO3 26  Vitals:   03/15/22 0541 03/15/22 0625 03/15/22 0754 03/15/22 0907  BP:  (!) 157/77 (!) 151/57   Pulse: 73 81 73   Resp: _0 Temp:  98.6 F (37 C) 97.9 F (36.6 C)   TempSrc:  Oral Oral   SpO2: 96% 96% 98% 97%  Weight:  109.4 kg    Height:  _1  (1.803 m)      Exam: Gen is resting, awakens to voice No rash, cyanosis or gangrene Sclera anicteric, throat clear  No jvd or bruits Chest clear bilat to bases, no rales/ wheezing RRR no MRG Abd soft ntnd no mass or ascites +bs GU normal male MS no joint effusions or deformity Ext No sig LEE Neuro as above, has movement disorder (restlessness) R IJ TDC some scant old blood around exit site      Home meds include - coreg 12.5 bid, lasix 20 mg mwf, austedo, synthorid, creon, myfortic 180 bid, prednisone 5 qd, tacrolimus 1.5 bid       Date                           Creat               eGFR   2017-2018                 1.11- 1.56   2020                          1.88   2021                          1.54- 2.09   Sept 2023                  3.56                 18 ml/min    11/1- 02/28/22           4.38 >> 3.28    AKI on CKD4, 14 >> 20 ml/min    11/17                                    5.90                 10     11/18                                    5.78                 10       UA negative    UNa <10, UCr 151    CXR - IMPRESSION: Very low lung volumes. Bilateral patchy airspace opacities may represent a combination of bronchovascular crowding, pulmonary edema, and possible multifocal infection.     Temp 992, wBC 4K  , BP 140/70, HR 60 -70, RR        Assessment/ Plan: AKI on CKD 4 transplant - Started CRRT 03/11/22 - 11/21:  b/l creatinine  3.3- 3.6, eGFR 18- 20 ml/min.  Creat here 5.9 on admission 11/17 in the setting of SOB and recent  admission for PNA rx'd w/ abx. Likely ATN in setting of advanced CKD UA neg, urine lytes c/w pre-renal.  11/20 Tplt renal US normal/ no HN IR consulted for [facility], R IJ placed 11/22 No current need for RRT, trending UOP and daily labs, reassess in AM If no clear improvement over weekend plan CLIP to [facility] Renal transplant - done around 2010, cont prograf, myfortic and pred.  Renal US stable. Cont transplant meds.  DM2 - on trulicity, insulin pump at home. Per pmd HTN - cont coreg, amlodipine and hydralazine, holding lasix Movement disorder - chronic issue, this is not myoclonus but is his baseline "chorea" per the sister. Getting Hillery Aldo, MD  03/15/2022, 10:42 AM   Recent Labs  Lab 03/14/22 0726 03/14/22 1021 03/15/22 0544  HGB 8.1*  --  7.5*  ALBUMIN 2.9* 3.1* 2.7*  CALCIUM 8.0* 8.1* 7.8*  PHOS 3.7 3.7 3.9  CREATININE 2.61* 2.78* 3.37*  K 3.8 3.9 3.9    No results for input(s): "IRON", "TIBC", "FERRITIN" in the last 168 hours. Inpatient medications:  albuterol  2.5 mg Nebulization Q6H   amLODipine  10 mg Oral Daily   atorvastatin  10 mg Oral Daily   Chlorhexidine Gluconate Cloth  6 each Topical Daily   Deutetrabenazine  6 mg Oral BID   feeding supplement  237 mL Oral BID BM   heparin  5,000 Units Subcutaneous Q8H   hydrALAZINE  50 mg Oral TID   insulin aspart  0-5 Units Subcutaneous QHS   insulin aspart  0-9 Units Subcutaneous TID WC   levothyroxine  112 mcg Oral Q0600   lipase/protease/amylase  72,000 Units Oral TID WC   melatonin  3 mg Oral QHS   multivitamin  1 tablet Oral QHS   mycophenolate  180 mg Oral BID   mouth rinse  15 mL Mouth Rinse 4 times per day   predniSONE  5 mg Oral Q breakfast   QUEtiapine  50 mg Oral QHS   senna  1 tablet Oral BID   tacrolimus  1.5 mg Oral BID    prismasol BGK 4/2.5 Stopped (03/13/22 1240)   acetaminophen **OR** acetaminophen, albuterol, haloperidol lactate, hydrALAZINE, lidocaine (PF),  naphazoline-glycerin, ondansetron **OR** ondansetron (ZOFRAN) IV, mouth rinse, polyethylene glycol

## 2022-03-15 NOTE — Progress Notes (Signed)
Patient left with [facility] to transfer to [place]. Report handoff to [facility] RN and Kaylyn Lim RN of 6E

## 2022-03-16 DIAGNOSIS — N184 Chronic kidney disease, stage 4 (severe): Secondary | ICD-10-CM | POA: Diagnosis not present

## 2022-03-16 DIAGNOSIS — N179 Acute kidney failure, unspecified: Secondary | ICD-10-CM | POA: Diagnosis not present

## 2022-03-16 LAB — CBC
HCT: 25 % — ABNORMAL LOW (ref 39.0–52.0)
Hemoglobin: 7.8 g/dL — ABNORMAL LOW (ref 13.0–17.0)
MCH: 30.1 pg (ref 26.0–34.0)
MCHC: 31.2 g/dL (ref 30.0–36.0)
MCV: 96.5 fL (ref 80.0–100.0)
Platelets: 180 10*3/uL (ref 150–400)
RBC: 2.59 MIL/uL — ABNORMAL LOW (ref 4.22–5.81)
RDW: 14.9 % (ref 11.5–15.5)
WBC: 6.1 10*3/uL (ref 4.0–10.5)
nRBC: 0 % (ref 0.0–0.2)

## 2022-03-16 LAB — RENAL FUNCTION PANEL
Albumin: 2.7 g/dL — ABNORMAL LOW (ref 3.5–5.0)
Albumin: 3.2 g/dL — ABNORMAL LOW (ref 3.5–5.0)
Anion gap: 7 (ref 5–15)
Anion gap: 13 (ref 5–15)
BUN: 58 mg/dL — ABNORMAL HIGH (ref 8–23)
BUN: 61 mg/dL — ABNORMAL HIGH (ref 8–23)
CO2: 24 mmol/L (ref 22–32)
CO2: 23 mmol/L (ref 22–32)
Calcium: 7.6 mg/dL — ABNORMAL LOW (ref 8.9–10.3)
Calcium: 8.4 mg/dL — ABNORMAL LOW (ref 8.9–10.3)
Chloride: 104 mmol/L (ref 98–111)
Chloride: 103 mmol/L (ref 98–111)
Creatinine, Ser: 3.77 mg/dL — ABNORMAL HIGH (ref 0.61–1.24)
Creatinine, Ser: 3.67 mg/dL — ABNORMAL HIGH (ref 0.61–1.24)
GFR, Estimated: 17 mL/min — ABNORMAL LOW (ref >60)
GFR, Estimated: 17 mL/min — ABNORMAL LOW (ref >60)
Glucose, Bld: 278 mg/dL — ABNORMAL HIGH (ref 70–99)
Glucose, Bld: 323 mg/dL — ABNORMAL HIGH (ref 70–99)
Phosphorus: 3.6 mg/dL (ref 2.5–4.6)
Phosphorus: 3.3 mg/dL (ref 2.5–4.6)
Potassium: 4 mmol/L (ref 3.5–5.1)
Potassium: 4.3 mmol/L (ref 3.5–5.1)
Sodium: 135 mmol/L (ref 135–145)
Sodium: 139 mmol/L (ref 135–145)

## 2022-03-16 LAB — GLUCOSE, CAPILLARY
Glucose-Capillary: 237 mg/dL — ABNORMAL HIGH (ref 70–99)
Glucose-Capillary: 174 mg/dL — ABNORMAL HIGH (ref 70–99)
Glucose-Capillary: 236 mg/dL — ABNORMAL HIGH (ref 70–99)
Glucose-Capillary: 285 mg/dL — ABNORMAL HIGH (ref 70–99)
Glucose-Capillary: 359 mg/dL — ABNORMAL HIGH (ref 70–99)

## 2022-03-16 LAB — T3: T3, Total: 47 ng/dL — ABNORMAL LOW (ref 71–180)

## 2022-03-16 LAB — MAGNESIUM: Magnesium: 2.5 mg/dL — ABNORMAL HIGH (ref 1.7–2.4)

## 2022-03-16 MED ORDER — NYSTATIN 100000 UNIT/GM EX POWD
Freq: Three times a day (TID) | CUTANEOUS | Status: AC
  Filled 2022-03-16 (×3): qty 15

## 2022-03-16 MED ORDER — INSULIN ASPART 100 UNIT/ML IJ SOLN
5.0000 [IU] | Freq: Once | INTRAMUSCULAR | Status: AC
  Administered 2022-03-16: 5 [IU] via SUBCUTANEOUS

## 2022-03-16 NOTE — Evaluation (Signed)
Occupational Therapy Evaluation Patient Details Name: Nathan Tyler MRN: [id number] DOB: April 14, 1956 Today's Date: 03/16/2022   History of Present Illness 66 y.o. male  past medical history significant for renal transplant chronic renal disease stage IV, diabetes mellitus type 2, essential hypertension , movement disorder who was recently hospitalized 11/1 through 11/8 for CAP, discharged home with home health RN, PT. Returned to UC, then ED with progressive weakness,dyspnea , hypoxiaand  found to have acute on chronic hypercapnic respiratory failure   Clinical Impression   Pt lives with his wife and intermittently uses a RW. He stands to shower only sitting to rinse. His wife assists with IADLs. Pt presents with impulsivity, generalized weakness, impaired safety awareness and memory and decreased standing balance with posterior bias. Pt requires set up to moderate assistance for ADLs and min assist with verbal cues for safety for standing and side steps at EOB with RW. Recommend post hospitalization rehab prior to return home, pt is in agreement. Will follow acutely.     Recommendations for follow up therapy are one component of a multi-disciplinary discharge planning process, led by the attending physician.  Recommendations may be updated based on patient status, additional functional criteria and insurance authorization.   Follow Up Recommendations  Acute Inpatient Rehab    Assistance Recommended at Discharge Frequent or constant Supervision/Assistance  Patient can return home with the following A little help with walking and/or transfers;A lot of help with bathing/dressing/bathroom;Assistance with cooking/housework;Direct supervision/assist for medications management;Direct supervision/assist for financial management;Assist for transportation;Help with stairs or ramp for entrance    Functional Status Assessment  Patient has had a recent decline in their functional status and demonstrates  the ability to make significant improvements in function in a reasonable and predictable amount of time.  Equipment Recommendations  Other (comment) (defer to next venue)    Recommendations for Other Services       Precautions / Restrictions Precautions Precautions: Fall Restrictions Weight Bearing Restrictions: No      Mobility Bed Mobility Overal bed mobility: Needs Assistance Bed Mobility: Supine to Sit, Sit to Supine     Supine to sit: Min guard Sit to supine: Supervision   General bed mobility comments: impulsively sitting up EOB, cues to slow pace when returning to supine    Transfers Overall transfer level: Needs assistance Equipment used: Rolling walker (2 wheels) Transfers: Sit to/from Stand, Bed to chair/wheelchair/BSC Sit to Stand: Min assist           General transfer comment: cues for hand placement, steadying assist, posterior bias      Balance Overall balance assessment: Needs assistance   Sitting balance-Leahy Scale: Good Sitting balance - Comments: no LOB donning socks   Standing balance support: Bilateral upper extremity supported, During functional activity, Reliant on assistive device for balance Standing balance-Leahy Scale: Poor Standing balance comment: min assist and RW                           ADL either performed or assessed with clinical judgement   ADL Overall ADL's : Needs assistance/impaired Eating/Feeding: Set up;Sitting   Grooming: Sitting;Supervision/safety   Upper Body Bathing: Minimal assistance;Sitting   Lower Body Bathing: Moderate assistance;Sit to/from stand;Sitting/lateral leans;Min guard Lower Body Bathing Details (indicate cue type and reason): min guard from sitting, mod from standing Upper Body Dressing : Minimal assistance;Sitting   Lower Body Dressing: Moderate assistance;Sit to/from stand;Sitting/lateral leans;Min guard Lower Body Dressing Details (indicate cue type and reason):  min guard to don  socks, mod if standing Toilet Transfer: Rolling walker (2 wheels);Minimal assistance           Functional mobility during ADLs: Rolling walker (2 wheels);Minimal assistance General ADL Comments: stood EOB and side stepped     Vision Ability to See in Adequate Light: 0 Adequate Patient Visual Report: No change from baseline       Perception     Praxis      Pertinent Vitals/Pain Pain Assessment Pain Assessment: No/denies pain     Hand Dominance Left   Extremity/Trunk Assessment Upper Extremity Assessment Upper Extremity Assessment: Generalized weakness (mild weakness)   Lower Extremity Assessment Lower Extremity Assessment: Defer to PT evaluation   Cervical / Trunk Assessment Cervical / Trunk Assessment: Normal;Other exceptions (obese)   Communication Communication Communication: No difficulties   Cognition Arousal/Alertness: Awake/alert Behavior During Therapy: Impulsive Overall Cognitive Status: Impaired/Different from baseline Area of Impairment: Safety/judgement, Memory, Problem solving                     Memory: Decreased short-term memory   Safety/Judgement: Decreased awareness of safety, Decreased awareness of deficits   Problem Solving: Difficulty sequencing, Requires verbal cues General Comments: cues to slow pace,     General Comments       Exercises     Shoulder Instructions      Home Living Family/patient expects to be discharged to:: Private residence Living Arrangements: Spouse/significant other Available Help at Discharge: Family;Available 24 hours/day Type of Home: House Home Access: Stairs to enter [employer] of Steps: 3 Entrance Stairs-Rails: None Home Layout: Two level;Able to live on main level with bedroom/bathroom;Bed/bath upstairs Alternate Level Stairs-Number of Steps: 13 Alternate Level Stairs-Rails: Right Bathroom Shower/Tub: Occupational psychologist: Standard     Home Equipment: Chartered certified accountant (2 wheels);Shower seat   Additional Comments: Occasional use of RW,      Prior Functioning/Environment Prior Level of Function : Needs assist             Mobility Comments: occasional use of RW ADLs Comments: independent in self care, assisted for IADLs        OT Problem List: Decreased strength;Decreased activity tolerance;Impaired balance (sitting and/or standing);Decreased cognition;Decreased safety awareness;Decreased knowledge of use of DME or AE;Obesity      OT Treatment/Interventions: Self-care/ADL training;Therapeutic activities;DME and/or AE instruction;Balance training;Patient/family education;Cognitive remediation/compensation    OT Goals(Current goals can be found in the care plan section) Acute Rehab OT Goals OT Goal Formulation: With patient Time For Goal Achievement: 03/30/22 Potential to Achieve Goals: Good ADL Goals Pt Will Perform Grooming: with supervision;standing Pt Will Perform Lower Body Bathing: with supervision;sit to/from stand Pt Will Perform Lower Body Dressing: with supervision;sit to/from stand Pt Will Transfer to Toilet: with supervision;ambulating;bedside commode Pt Will Perform Toileting - Clothing Manipulation and hygiene: with supervision;sit to/from stand  OT Frequency: Min 2X/week    Co-evaluation              AM-PAC OT "6 Clicks" Daily Activity     Outcome Measure Help from another person eating meals?: None Help from another person taking care of personal grooming?: A Little Help from another person toileting, which includes using toliet, bedpan, or urinal?: A Lot Help from another person bathing (including washing, rinsing, drying)?: A Lot Help from another person to put on and taking off regular upper body clothing?: A Little Help from another person to put on and taking off regular lower body clothing?:  A Lot 6 Click Score: 16   End of Session Equipment Utilized During Treatment: Gait belt;Rolling walker (2  wheels)  Activity Tolerance: Patient tolerated treatment well Patient left: in bed;with call bell/phone within reach;with bed alarm set;with family/visitor present;with nursing/sitter in room  OT Visit Diagnosis: Unsteadiness on feet (R26.81);Muscle weakness (generalized) (M62.81);Other symptoms and signs involving cognitive function                Time: 1520-1535 OT Time Calculation (min): 15 min Charges:  OT General Charges $OT Visit: 1 Visit OT Evaluation $OT Eval Moderate Complexity: [place], OTR/L Acute Rehabilitation Services Office: [phone]  Malka So 03/16/2022, 3:49 PM

## 2022-03-16 NOTE — Progress Notes (Signed)
Nathan Tyler  IOX:[id number] DOB: [birth year] DOA: 03/09/2022 PCP: Celene Squibb, MD    Brief Narrative:  66 year old with a history of CKD status post transplant, chorea, DM2, HTN, HLD, and hypothyroidism who presented to the hospital 11/17 with shortness of breath following a hospitalization 11/1 > 11/8 with community-acquired pneumonia.  His evaluation in the ER was most suggestive of volume overload and he was found to be suffering with acutely worsening renal failure.  Consultants:  Nephrology IR  Goals of Care:  Code Status: DNR   DVT prophylaxis: Subcutaneous heparin  Interim Hx: No acute events reported overnight.  Afebrile with stable vitals.  Saturation 98%.  Creatinine is mildly increased over last 24 hours.  Potassium and bicarb stable.  Hemoglobin stable.  In very good spirits sitting up in a bedside chair with no new complaints.  Assessment & Plan:  Acute kidney failure on CKD stage IV in a transplant kidney Baseline creatinine 3-4 - presented with creatinine at 5.9 - tunneled dialysis catheter placed 11/22 by IR - CRRT was being administered at [facility] -transferred to [facility] for potential need for full hemodialysis -nephrology is presently monitoring without dialysis  Acute hypoxic respiratory failure As evidenced by significant dyspnea and requirement for 4 L nasal cannula to keep saturations at 90% -CXR at presentation consistent with pulmonary edema -has improved with CRRT  Acute metabolic encephalopathy due to sundowning, hypoxia, and renal failure Required Precedex during initial portion of hospital stay -utilizing low-dose Haldol with close monitoring for worsening of chorea -mental status much improved/stable   Status post renal transplant 2008 -chronically immunosuppressed On tacrolimus, mycophenolate, and low-dose prednisone chronically  Anemia of chronic kidney disease No evidence of acute blood loss at present  DM2 with hyperglycemia A1c 5.7  -likely due to use of Trulicity in the setting of worsening renal function - CBG presently reasonably controlled  HTN Blood pressure trending upward -adjust treatment plan and follow  HLD Continue Lipitor  Hypothyroidism Continue usual dose of Synthroid  Chorea Continue home dose of Austedo  Family Communication: No family present Disposition:     Objective: Blood pressure (!) 146/69, pulse 74, temperature 97.7 F (36.5 C), temperature source Oral, resp. rate 18, height '5\' 11"'$  (1.803 m), weight 110 kg, SpO2 96 %.  Intake/Output Summary (Last 24 hours) at 03/16/2022 1043 Last data filed at 03/16/2022 0451 Gross per 24 hour  Intake 360 ml  Output 550 ml  Net -190 ml    Filed Weights   03/15/22 0500 03/15/22 0625 03/16/22 0429  Weight: 112.4 kg 109.4 kg 110 kg    Examination: General: No acute respiratory distress Lungs: Clear to auscultation bilaterally without wheezes or crackles Cardiovascular: Regular rate and rhythm without murmur  Abdomen: Nontender, nondistended, soft, bowel sounds positive, no rebound, no ascites, no appreciable mass Extremities: No significant cyanosis, clubbing, or edema bilateral lower extremities  CBC: Recent Labs  Lab 03/14/22 0726 03/15/22 0544 03/16/22 0143  WBC 5.7 6.0 6.1  HGB 8.1* 7.5* 7.8*  HCT 26.9* 25.9* 25.0*  MCV 98.5 99.6 96.5  PLT 184 185 268    Basic Metabolic Panel: Recent Labs  Lab 03/14/22 0726 03/14/22 1021 03/15/22 0544 03/15/22 1552 03/16/22 0143  NA 143   < > 139 139 135  K 3.8   < > 3.9 4.4 4.0  CL 112*   < > 109 105 104  CO2 26   < > '26 25 24  '$ GLUCOSE 135*   < > 200*  260* 278*  BUN 50*   < > 56* 52* 58*  CREATININE 2.61*   < > 3.37* 3.53* 3.77*  CALCIUM 8.0*   < > 7.8* 8.0* 7.6*  MG 2.6*  --  2.3  --  2.5*  PHOS 3.7   < > 3.9 3.5 3.6   < > = values in this interval not displayed.    GFR: Estimated Creatinine Clearance: 23.7 mL/min (A) (by C-G formula based on SCr of 3.77 mg/dL  (H)).   Scheduled Meds:  amLODipine  10 mg Oral Daily   atorvastatin  10 mg Oral Daily   Chlorhexidine Gluconate Cloth  6 each Topical Daily   Deutetrabenazine  6 mg Oral BID   feeding supplement  237 mL Oral BID BM   heparin  5,000 Units Subcutaneous Q8H   hydrALAZINE  50 mg Oral TID   insulin aspart  0-5 Units Subcutaneous QHS   insulin aspart  0-9 Units Subcutaneous TID WC   levothyroxine  112 mcg Oral Q0600   lipase/protease/amylase  72,000 Units Oral TID WC   melatonin  3 mg Oral QHS   multivitamin  1 tablet Oral QHS   mycophenolate  180 mg Oral BID   nystatin   Topical TID   mouth rinse  15 mL Mouth Rinse 4 times per day   predniSONE  5 mg Oral Q breakfast   QUEtiapine  50 mg Oral QHS   senna  1 tablet Oral BID   tacrolimus  1.5 mg Oral BID      LOS: 7 days   [name], MD [facility] Office  [phone] Pager - Text Page per Shea Evans  If 7PM-7AM, please contact night-coverage per Amion 03/16/2022, 10:43 AM

## 2022-03-16 NOTE — Progress Notes (Signed)
[facility] Progress Note  Subjective:  UOP at least 0.5L  SCr up but not that much, K 4.0 Feels good  Vitals:   03/15/22 2113 03/16/22 0300 03/16/22 0429 03/16/22 0829  BP: (!) 156/59  (!) 149/67 (!) 146/69  Pulse: 71 74 70 74  Resp: _0 Temp: 99.2 F (37.3 C)  98.7 F (37.1 C) 97.7 F (36.5 C)  TempSrc: Oral  Oral Oral  SpO2: 90% (!) 40% 96% 96%  Weight:   110 kg   Height:        Exam: Gen is resting, awakens to voice, affable and engaging No rash, cyanosis or gangrene Sclera anicteric, throat clear  No jvd or bruits Chest clear bilat to bases, no rales/ wheezing RRR no MRG Abd soft ntnd no mass or ascites +bs GU normal male MS no joint effusions or deformity Ext No sig LEE Neuro as above, has movement disorder (restlessness) R IJ TDC C/D/I      Home meds include - coreg 12.5 bid, lasix 20 mg mwf, austedo, synthorid, creon, myfortic 180 bid, prednisone 5 qd, tacrolimus 1.5 bid       Date                           Creat               eGFR   2017-2018                 1.11- 1.56   2020                          1.88   2021                          1.54- 2.09   Sept 2023                  3.56                 18 ml/min    11/1- 02/28/22           4.38 >> 3.28    AKI on CKD4, 14 >> 20 ml/min    11/17                                    5.90                 10     11/18                                    5.78                 10       UA negative    UNa <10, UCr 151    CXR - IMPRESSION: Very low lung volumes. Bilateral patchy airspace opacities may represent a combination of bronchovascular crowding, pulmonary edema, and possible multifocal infection.     Temp 992, wBC 4K  , BP 140/70, HR 60 -70, RR        Assessment/ Plan: AKI on CKD 4 transplant - Started CRRT 03/11/22 - 11/21:  b/l creatinine 3.3- 3.6, eGFR 18- 20 ml/min.  Creat here 5.9 on admission  11/17 in the setting of SOB and recent admission for PNA rx'd w/ abx. Likely ATN in setting  of advanced CKD UA neg, urine lytes c/w pre-renal.  11/20 Tplt renal US normal/ no HN IR consulted for [facility], R IJ placed 11/22 No current need for RRT, trending UOP and daily labs, reassess in AM If no clear improvement over weekend plan CLIP to [facility] Renal transplant - done around 2010, cont prograf, myfortic and pred.  Renal US stable. Cont transplant meds.  DM2 - on trulicity, insulin pump at home. Per pmd HTN - cont coreg, amlodipine and hydralazine, holding lasix Movement disorder - chronic issue, this is not myoclonus but is his baseline "chorea" per the sister. Getting Hillery Aldo, MD  03/16/2022, 10:20 AM   Recent Labs  Lab 03/15/22 0544 03/15/22 1552 03/16/22 0143  HGB 7.5*  --  7.8*  ALBUMIN 2.7* 2.9* 2.7*  CALCIUM 7.8* 8.0* 7.6*  PHOS 3.9 3.5 3.6  CREATININE 3.37* 3.53* 3.77*  K 3.9 4.4 4.0    No results for input(s): "IRON", "TIBC", "FERRITIN" in the last 168 hours. Inpatient medications:  amLODipine  10 mg Oral Daily   atorvastatin  10 mg Oral Daily   Chlorhexidine Gluconate Cloth  6 each Topical Daily   Deutetrabenazine  6 mg Oral BID   feeding supplement  237 mL Oral BID BM   heparin  5,000 Units Subcutaneous Q8H   hydrALAZINE  50 mg Oral TID   insulin aspart  0-5 Units Subcutaneous QHS   insulin aspart  0-9 Units Subcutaneous TID WC   levothyroxine  112 mcg Oral Q0600   lipase/protease/amylase  72,000 Units Oral TID WC   melatonin  3 mg Oral QHS   multivitamin  1 tablet Oral QHS   mycophenolate  180 mg Oral BID   nystatin   Topical TID   mouth rinse  15 mL Mouth Rinse 4 times per day   predniSONE  5 mg Oral Q breakfast   QUEtiapine  50 mg Oral QHS   senna  1 tablet Oral BID   tacrolimus  1.5 mg Oral BID     acetaminophen, albuterol, haloperidol lactate, hydrALAZINE, lidocaine (PF), naphazoline-glycerin, ondansetron **OR** ondansetron (ZOFRAN) IV, mouth rinse, polyethylene glycol

## 2022-03-16 NOTE — Progress Notes (Signed)
Physical Therapy Treatment Patient Details Name: Nathan Tyler MRN: [id number] DOB: 27-Jan-1956 Today's Date: 03/16/2022   History of Present Illness 66 y.o. male  past medical history significant for renal transplant chronic renal disease stage IV, diabetes mellitus type 2, essential hypertension , movement disorder who was recently hospitalized 11/1 through 11/8 for CAP, discharged home with home health RN, PT. Returned to UC, then ED with progressive weakness,dyspnea , hypoxiaand  found to have acute on chronic hypercapnic respiratory failure    PT Comments    Pt was seen for mobility on side of bed to get to chair, but is requring redirection for balance and safety.  Pt is to some degree impulsive, and complicated by his mm tone.  This combination has left him with a continual need for hands on help to move although he is motivated to try.  This makes him a better candidate as he is up in the recliner today, having taken steps to get there.  Upgrading to CIR recommendation, with pt tolerance for activity improving and his willingness to work being a big factor for expected success with this venue.  Follow up for goals of acute PT as are outlined on POC.   Recommendations for follow up therapy are one component of a multi-disciplinary discharge planning process, led by the attending physician.  Recommendations may be updated based on patient status, additional functional criteria and insurance authorization.  Follow Up Recommendations  Acute inpatient rehab (3hours/day) Can patient physically be transported by private vehicle: No   Assistance Recommended at Discharge Frequent or constant Supervision/Assistance  Patient can return home with the following A lot of help with walking and/or transfers;A little help with bathing/dressing/bathroom;Assistance with cooking/housework;Direct supervision/assist for medications management;Direct supervision/assist for financial management;Assist for  transportation;Help with stairs or ramp for entrance   Equipment Recommendations  None recommended by PT    Recommendations for Other Services Rehab consult     Precautions / Restrictions Precautions Precautions: Fall Restrictions Weight Bearing Restrictions: No     Mobility  Bed Mobility Overal bed mobility: Needs Assistance Bed Mobility: Supine to Sit     Supine to sit: Min assist, Min guard     General bed mobility comments: minor help to sit up and manage lines which he is not attending to yet    Transfers Overall transfer level: Needs assistance Equipment used: Rolling walker (2 wheels) Transfers: Sit to/from Stand, Bed to chair/wheelchair/BSC Sit to Stand: Min assist Stand pivot transfers: Min assist         General transfer comment: min assist but nearly mod to manage balance    Ambulation/Gait               General Gait Details: steps to manage transfer to chair   Stairs             Wheelchair Mobility    Modified Rankin (Stroke Patients Only)       Balance Overall balance assessment: Needs assistance   Sitting balance-Leahy Scale: Good     Standing balance support: Bilateral upper extremity supported, During functional activity Standing balance-Leahy Scale: Poor Standing balance comment: requires specific cues on RW for stability once moving                            Cognition Arousal/Alertness: Awake/alert Behavior During Therapy: Impulsive Overall Cognitive Status: Impaired/Different from baseline Area of Impairment: Safety/judgement, Memory, Problem solving, Awareness  Memory: Decreased short-term memory, Decreased recall of precautions   Safety/Judgement: Decreased awareness of safety, Decreased awareness of deficits Awareness: Intellectual Problem Solving: Slow processing, Requires verbal cues, Requires tactile cues General Comments: asked pt to adjust his standing posture for  sitting and requires cues to slow descent        Exercises General Exercises - Lower Extremity Ankle Circles/Pumps: AROM, 5 reps Long Arc Quad: Strengthening, 10 reps Heel Slides: Strengthening, 10 reps Hip ABduction/ADduction: Strengthening, 10 reps    General Comments General comments (skin integrity, edema, etc.): pt is up to side of bed with help, distractible but able to be redirected to get to chair.      Pertinent Vitals/Pain Pain Assessment Pain Assessment: No/denies pain    Home Living Family/patient expects to be discharged to:: Private residence Living Arrangements: Spouse/significant other Available Help at Discharge: Family;Available 24 hours/day Type of Home: House Home Access: Stairs to enter Entrance Stairs-Rails: None Entrance Stairs-Number of Steps: 3 Alternate Level Stairs-Number of Steps: 13 Home Layout: Two level;Able to live on main level with bedroom/bathroom;Bed/bath upstairs Home Equipment: Conservation officer, nature (2 wheels);Shower seat Additional Comments: Occasional use of RW,    Prior Function            PT Goals (current goals can now be found in the care plan section) Acute Rehab PT Goals Patient Stated Goal: Get well go home Progress towards PT goals: Progressing toward goals    Frequency    Min 3X/week      PT Plan Discharge plan needs to be updated    Co-evaluation              AM-PAC PT "6 Clicks" Mobility   Outcome Measure  Help needed turning from your back to your side while in a flat bed without using bedrails?: A Little Help needed moving from lying on your back to sitting on the side of a flat bed without using bedrails?: A Little Help needed moving to and from a bed to a chair (including a wheelchair)?: A Lot Help needed standing up from a chair using your arms (e.g., wheelchair or bedside chair)?: A Lot Help needed to walk in hospital room?: A Lot Help needed climbing 3-5 steps with a railing? : Total 6 Click Score:  13    End of Session Equipment Utilized During Treatment: Gait belt Activity Tolerance: Patient tolerated treatment well Patient left: with call bell/phone within reach;in bed;with bed alarm set Nurse Communication: Mobility status PT Visit Diagnosis: Unsteadiness on feet (R26.81);Muscle weakness (generalized) (M62.81)     Time: 6440-3474 PT Time Calculation (min) (ACUTE ONLY): 27 min  Charges:  $Gait Training: 8-22 mins $Therapeutic Exercise: 8-22 mins       Ramond Dial 03/16/2022, 4:45 PM  Mee Hives, PT PhD Acute Rehab Dept. Number: [place]

## 2022-03-17 DIAGNOSIS — N179 Acute kidney failure, unspecified: Secondary | ICD-10-CM | POA: Diagnosis not present

## 2022-03-17 DIAGNOSIS — N184 Chronic kidney disease, stage 4 (severe): Secondary | ICD-10-CM | POA: Diagnosis not present

## 2022-03-17 LAB — RENAL FUNCTION PANEL
Albumin: 2.7 g/dL — ABNORMAL LOW (ref 3.5–5.0)
Albumin: 2.9 g/dL — ABNORMAL LOW (ref 3.5–5.0)
Anion gap: 10 (ref 5–15)
Anion gap: 8 (ref 5–15)
BUN: 63 mg/dL — ABNORMAL HIGH (ref 8–23)
BUN: 59 mg/dL — ABNORMAL HIGH (ref 8–23)
CO2: 24 mmol/L (ref 22–32)
CO2: 25 mmol/L (ref 22–32)
Calcium: 7.8 mg/dL — ABNORMAL LOW (ref 8.9–10.3)
Calcium: 8 mg/dL — ABNORMAL LOW (ref 8.9–10.3)
Chloride: 103 mmol/L (ref 98–111)
Chloride: 105 mmol/L (ref 98–111)
Creatinine, Ser: 3.69 mg/dL — ABNORMAL HIGH (ref 0.61–1.24)
Creatinine, Ser: 3.55 mg/dL — ABNORMAL HIGH (ref 0.61–1.24)
GFR, Estimated: 17 mL/min — ABNORMAL LOW (ref >60)
GFR, Estimated: 18 mL/min — ABNORMAL LOW (ref >60)
Glucose, Bld: 307 mg/dL — ABNORMAL HIGH (ref 70–99)
Glucose, Bld: 282 mg/dL — ABNORMAL HIGH (ref 70–99)
Phosphorus: 3.2 mg/dL (ref 2.5–4.6)
Phosphorus: 3.1 mg/dL (ref 2.5–4.6)
Potassium: 4.2 mmol/L (ref 3.5–5.1)
Potassium: 4.4 mmol/L (ref 3.5–5.1)
Sodium: 137 mmol/L (ref 135–145)
Sodium: 138 mmol/L (ref 135–145)

## 2022-03-17 LAB — CBC
HCT: 24.4 % — ABNORMAL LOW (ref 39.0–52.0)
Hemoglobin: 7.8 g/dL — ABNORMAL LOW (ref 13.0–17.0)
MCH: 30.4 pg (ref 26.0–34.0)
MCHC: 32 g/dL (ref 30.0–36.0)
MCV: 94.9 fL (ref 80.0–100.0)
Platelets: 175 10*3/uL (ref 150–400)
RBC: 2.57 MIL/uL — ABNORMAL LOW (ref 4.22–5.81)
RDW: 14.7 % (ref 11.5–15.5)
WBC: 6.4 10*3/uL (ref 4.0–10.5)
nRBC: 0 % (ref 0.0–0.2)

## 2022-03-17 LAB — GLUCOSE, CAPILLARY
Glucose-Capillary: 230 mg/dL — ABNORMAL HIGH (ref 70–99)
Glucose-Capillary: 272 mg/dL — ABNORMAL HIGH (ref 70–99)
Glucose-Capillary: 252 mg/dL — ABNORMAL HIGH (ref 70–99)
Glucose-Capillary: 299 mg/dL — ABNORMAL HIGH (ref 70–99)
Glucose-Capillary: 297 mg/dL — ABNORMAL HIGH (ref 70–99)

## 2022-03-17 LAB — MAGNESIUM: Magnesium: 2.6 mg/dL — ABNORMAL HIGH (ref 1.7–2.4)

## 2022-03-17 MED ORDER — INSULIN ASPART 100 UNIT/ML IJ SOLN
0.0000 [IU] | Freq: Three times a day (TID) | INTRAMUSCULAR | Status: DC
  Administered 2022-03-17: 8 [IU] via SUBCUTANEOUS
  Administered 2022-03-18: 5 [IU] via SUBCUTANEOUS
  Administered 2022-03-18: 3 [IU] via SUBCUTANEOUS
  Administered 2022-03-18: 8 [IU] via SUBCUTANEOUS
  Administered 2022-03-19: 5 [IU] via SUBCUTANEOUS
  Administered 2022-03-19: 2 [IU] via SUBCUTANEOUS
  Administered 2022-03-19: 8 [IU] via SUBCUTANEOUS

## 2022-03-17 MED ORDER — CARVEDILOL 12.5 MG PO TABS
12.5000 mg | ORAL_TABLET | Freq: Two times a day (BID) | ORAL | Status: DC
  Administered 2022-03-17 – 2022-03-27 (×21): 12.5 mg via ORAL
  Filled 2022-03-17 (×21): qty 1

## 2022-03-17 NOTE — PMR Pre-admission (Shared)
PMR Admission Coordinator Pre-Admission Assessment  Patient: Nathan Tyler is an 66 y.o., male MRN: [id number] DOB: 27-Sep-1955 Height: _0  (180.3 cm) Weight: 124 kg  Insurance Information HMO:     PPO:      PCP:      IPA:      80/20: yes     OTHER:  PRIMARY: : Medicare a and b      Policy#: [id number] Subscriber: pt Benefits:  Phone #: passport one source online     Name:  Nathan Tyler. Date: 03/23/09 and B 10/22/2015     Deduct: $1600      Out of Pocket Max: none      Life Max: none CIR: 100%      SNF: 20 full days Outpatient: 80%     Co-Pay: 20% Home Health: 100%      Co-Pay: none DME: 80%     Co-Pay: 20% Providers: pt choice  SECONDARY: Generic Commercial Supplement      Policy#: [id number]      Phone#:   Financial Counselor:      Phone#:   The "Data Collection Information Summary" for patients in Inpatient Rehabilitation Facilities with attached "Privacy Act [place] Records" was provided and verbally reviewed with: Patient  Emergency Contact Information Contact Information     Name Relation Home Work [employer] [place],terri Spouse   [place] Sister [phone]     [place],Claire Daughter [phone]     Lebert, Lovern Mother [phone]  [phone]       Current Medical History  Patient Admitting Diagnosis: ARF History of Present Illness:  Nathan Tyler is a 66 year old right-handed male with history of hypertension, hyperlipidemia, PVD with history of right amputation fifth toe as well as left second toe, obesity with BMI 36.28, chronic anemia, hypothyroidism who renal transplantation 2008, chronically compromised maintained on Prograf as well as prednisone, CKD stage IV baseline creatinine 3-4 followed by renal services, diabetes mellitus with insulin pump, movement disorder.  Recently hospitalized 11/1 - 11/8 for community-acquired pneumonia did not show any growth on cultures.  Completed course of antibiotics and discharged home with home  health therapies.  Per chart review patient lives with spouse.  Two-level home with bed bath on main level.  Occasional use of a rolling walker prior to admission.  Presented 03/09/2022 with generalized weakness x 2 days as well as shortness of breath.  No fever or dysuria.  In the ED oxygen saturations were low at 80% on room air received nebulizer treatment.  Chest x-ray showed bilateral patchy airspace opacities representing a combination of bronchovascular crowding, pulmonary edema and multifocal infection.  Admission chemistries chloride 112 CO2 21 glucose 152 BUN 58 creatinine 3.28 GFR 20, hemoglobin 7.0, BNP 1590, troponin negative, procalcitonin 0.10.  Renal ultrasound showed no transplant hydronephrosis.  Echocardiogram with ejection fraction of 55 to 60% no wall motion abnormalities grade 2 diastolic dysfunction.  He was started on CRRT for fluid overload acutely worsening renal failure with follow-up per nephrology services with creatinine peak 5.9.  Received CRRT 11/19 - 11/21.  Right IJ placed 11/22 for TDC.  Renal function has continued to improve and no longer requiring HD.  IR was consulted to remove TDC.  Patient with bouts of confusion/sundowning hypoxia due to uremia initially requiring Precedex and has improved as patient does continue on Seroquel.  Placed on subcutaneous heparin for DVT prophylaxis.  Therapy evaluations completed due to patient decreased functional mobility was admitted for a comprehensive rehab  program.      Patient's medical record from [facility] has been reviewed by the rehabilitation admission coordinator and physician.  Past Medical History  Past Medical History:  Diagnosis Date   Chorea    Diabetes mellitus without complication ([place])    Essential hypertension    Hypercholesteremia    Hypothyroidism    Neuromuscular disorder (HCC)    PONV (postoperative nausea and vomiting)    Renal failure    Has had kidney transplant 2008    Has the patient  had major surgery during 100 days prior to admission? Yes  Family History   family history includes Colon cancer (age of onset: 25) in his maternal aunt.  Current Medications  Current Facility-Administered Medications:    acetaminophen (TYLENOL) tablet 650 mg, 650 mg, Oral, Q6H PRN, [name], MD   albuterol (PROVENTIL) (2.5 MG/3ML) 0.083% nebulizer solution 2.5 mg, 2.5 mg, Nebulization, Q2H PRN, Madueme, Elvira C, RPH, 2.5 mg at 03/11/22 1510   amLODipine (NORVASC) tablet 10 mg, 10 mg, Oral, Daily, Margaretha Seeds, MD, 10 mg at 03/17/22 0841   atorvastatin (LIPITOR) tablet 10 mg, 10 mg, Oral, Daily, Dahal, Binaya, MD, 10 mg at 03/17/22 0841   Chlorhexidine Gluconate Cloth 2 % PADS 6 each, 6 each, Topical, Daily, Dahal, Binaya, MD, 6 each at 03/16/22 0847   Deutetrabenazine TABS 6 mg, 6 mg, Oral, BID, Madueme, Elvira C, RPH, 6 mg at 03/16/22 2217   feeding supplement (ENSURE ENLIVE / ENSURE PLUS) liquid 237 mL, 237 mL, Oral, BID BM, Margaretha Seeds, MD, 237 mL at 03/16/22 1533   haloperidol lactate (HALDOL) injection 1 mg, 1 mg, Intravenous, Q3H PRN, Anders Simmonds, MD   heparin injection 5,000 Units, 5,000 Units, Subcutaneous, Q8H, Dahal, Binaya, MD, 5,000 Units at 03/17/22 0546   hydrALAZINE (APRESOLINE) injection 10 mg, 10 mg, Intravenous, Q6H PRN, Dahal, Binaya, MD, 10 mg at 03/15/22 0018   hydrALAZINE (APRESOLINE) tablet 50 mg, 50 mg, Oral, TID, Margaretha Seeds, MD, 50 mg at 03/17/22 0841   insulin aspart (novoLOG) injection 0-5 Units, 0-5 Units, Subcutaneous, QHS, Dahal, Binaya, MD, 5 Units at 03/16/22 2217   insulin aspart (novoLOG) injection 0-9 Units, 0-9 Units, Subcutaneous, TID WC, Dahal, Marlowe Aschoff, MD, 3 Units at 03/17/22 0657   levothyroxine (SYNTHROID) tablet 112 mcg, 112 mcg, Oral, Q0600, Dahal, Marlowe Aschoff, MD, 112 mcg at 03/17/22 0546   lidocaine (PF) (XYLOCAINE) 1 % injection, , , PRN, Markus Daft, MD, 30 mL at 03/14/22 1347   lipase/protease/amylase (CREON) capsule  72,000 Units, 72,000 Units, Oral, TID WC, Dahal, Binaya, MD, 72,000 Units at 03/17/22 0840   melatonin tablet 3 mg, 3 mg, Oral, QHS, Dahal, Binaya, MD, 3 mg at 03/16/22 2220   multivitamin (RENA-VIT) tablet 1 tablet, 1 tablet, Oral, QHS, Margaretha Seeds, MD, 1 tablet at 03/16/22 2217   mycophenolate (MYFORTIC) EC tablet 180 mg, 180 mg, Oral, BID, Dahal, Binaya, MD, 180 mg at 03/17/22 7342   naphazoline-glycerin (CLEAR EYES REDNESS) ophth solution 1-2 drop, 1-2 drop, Both Eyes, QID PRN, [name], MD, 1 drop at 03/17/22 0846   nystatin (MYCOSTATIN/NYSTOP) topical powder, , Topical, TID, Kayleen Memos, DO, Given at 03/16/22 2221   ondansetron (ZOFRAN) tablet 4 mg, 4 mg, Oral, Q6H PRN **OR** ondansetron (ZOFRAN) injection 4 mg, 4 mg, Intravenous, Q6H PRN, Dahal, Binaya, MD   Oral care mouth rinse, 15 mL, Mouth Rinse, 4 times per day, Terrilee Croak, MD, 15 mL at 03/16/22 2225  Oral care mouth rinse, 15 mL, Mouth Rinse, PRN, Dahal, Binaya, MD   polyethylene glycol (MIRALAX / GLYCOLAX) packet 17 g, 17 g, Oral, Daily PRN, Dahal, Binaya, MD   predniSONE (DELTASONE) tablet 5 mg, 5 mg, Oral, Q breakfast, Dahal, Binaya, MD, 5 mg at 03/17/22 0841   QUEtiapine (SEROQUEL) tablet 50 mg, 50 mg, Oral, QHS, Margaretha Seeds, MD, 50 mg at 03/16/22 2220   senna (SENOKOT) tablet 8.6 mg, 1 tablet, Oral, BID, Dahal, Binaya, MD, 8.6 mg at 03/17/22 0841   tacrolimus (PROGRAF) capsule 1.5 mg, 1.5 mg, Oral, BID, Dahal, Binaya, MD, 1.5 mg at 03/17/22 0841  Patients Current Diet:  Diet Order             Diet renal/carb modified with fluid restriction Diet-HS Snack? Nothing; Fluid restriction: 1200 mL Fluid; Room service appropriate? Yes; Fluid consistency: Thin  Diet effective now                   Precautions / Restrictions Precautions Precautions: Fall Precaution Comments: on HHFNC Restrictions Weight Bearing Restrictions: No   Has the patient had 2 or more falls or a fall with injury in the  past year? Yes  Prior Activity Level Community (5-7x/wk): Pt went out regularly  Prior Functional Level Self Care: Did the patient need help bathing, dressing, using the toilet or eating? Independent  Indoor Mobility: Did the patient need assistance with walking from room to room (with or without device)? Independent  Stairs: Did the patient need assistance with internal or external stairs (with or without device)? Independent  Functional Cognition: Did the patient need help planning regular tasks such as shopping or remembering to take medications? Dependent  Patient Information Are you of Hispanic, Latino/a,or Spanish origin?: A. No, not of Hispanic, Latino/a, or Spanish origin What is your race?: A. White Do you need or want an interpreter to communicate with a doctor or health care staff?: 0. No  Patient's Response To:  Health Literacy and Transportation Is the patient able to respond to health literacy and transportation needs?: Yes Health Literacy - How often do you need to have someone help you when you read instructions, pamphlets, or other written material from your doctor or pharmacy?: Sometimes In the past 12 months, has lack of transportation kept you from medical appointments or from getting medications?: No In the past 12 months, has lack of transportation kept you from meetings, work, or from getting things needed for daily living?: No  Development worker, international aid / [place] Devices/Equipment: None Home Equipment: Conservation officer, nature (2 wheels), Shower seat  Prior Device Use: Indicate devices/aids used by the patient prior to current illness, exacerbation or injury? None of the above  Current Functional Level Cognition  Overall Cognitive Status: Impaired/Different from baseline Orientation Level: Oriented X4 Safety/Judgement: Decreased awareness of safety, Decreased awareness of deficits General Comments: asked pt to adjust his standing posture for sitting  and requires cues to slow descent    Extremity Assessment (includes Sensation/Coordination)  Upper Extremity Assessment: Generalized weakness (mild weakness)  Lower Extremity Assessment: Defer to PT evaluation    ADLs  Overall ADL's : Needs assistance/impaired Eating/Feeding: Set up, Sitting Grooming: Sitting, Supervision/safety Upper Body Bathing: Minimal assistance, Sitting Lower Body Bathing: Moderate assistance, Sit to/from stand, Sitting/lateral leans, Min guard Lower Body Bathing Details (indicate cue type and reason): min guard from sitting, mod from standing Upper Body Dressing : Minimal assistance, Sitting Lower Body Dressing: Moderate assistance, Sit to/from stand, Sitting/lateral leans, Min  guard Lower Body Dressing Details (indicate cue type and reason): min guard to don socks, mod if standing Toilet Transfer: Rolling walker (2 wheels), Minimal assistance Functional mobility during ADLs: Rolling walker (2 wheels), Minimal assistance General ADL Comments: stood EOB and side stepped    Mobility  Overal bed mobility: Needs Assistance Bed Mobility: Supine to Sit Supine to sit: Min assist, Min guard Sit to supine: Supervision General bed mobility comments: minor help to sit up and manage lines which he is not attending to yet    Transfers  Overall transfer level: Needs assistance Equipment used: Rolling walker (2 wheels) Transfers: Sit to/from Stand, Bed to chair/wheelchair/BSC Sit to Stand: Min assist Bed to/from chair/wheelchair/BSC transfer type:: Stand pivot Stand pivot transfers: Min assist General transfer comment: min assist but nearly mod to manage balance    Ambulation / Gait / Stairs / Wheelchair Mobility  Ambulation/Gait General Gait Details: steps to manage transfer to chair    Posture / Balance Dynamic Sitting Balance Sitting balance - Comments: no LOB donning socks Balance Overall balance assessment: Needs assistance Sitting-balance support: Feet  supported, Bilateral upper extremity supported Sitting balance-Leahy Scale: Good Sitting balance - Comments: no LOB donning socks Standing balance support: Bilateral upper extremity supported, During functional activity Standing balance-Leahy Scale: Poor Standing balance comment: requires specific cues on RW for stability once moving    Special needs/care consideration Dialysis: Hemodialysis Monday, Wednesday, and Friday   Previous Home Environment (from acute therapy documentation) Living Arrangements: Spouse/significant other  Lives With: Spouse Available Help at Discharge: Family, Available 24 hours/day Type of Home: House Home Layout: Two level, Able to live on main level with bedroom/bathroom, Bed/bath upstairs Alternate Level Stairs-Rails: Right Alternate Level Stairs-Number of Steps: 13 Home Access: Stairs to enter Entrance Stairs-Rails: None Entrance Stairs-Number of Steps: 3 Bathroom Shower/Tub: Multimedia programmer: Programmer, systems: Yes Home Care Services: Yes Type of Home Care Services: [place] (if known): [facility] Additional Comments: Occasional use of RW,  Discharge Living Setting Plans for Discharge Living Setting: Patient's home Type of Home at Discharge: House Discharge Home Layout: Two level, 1/2 bath on main level, Bed/bath upstairs Alternate Level Stairs-Number of Steps: 13 Discharge Home Access: Stairs to enter Entrance Stairs-Rails: None Entrance Stairs-Number of Steps: 3 Discharge Bathroom Shower/Tub: Walk-in shower Discharge Bathroom Toilet: Standard Discharge Bathroom Accessibility: Yes How Accessible: Accessible via wheelchair, Accessible via walker Does the patient have any problems obtaining your medications?: No  Social/Family/Support Systems Patient Roles: Spouse Contact Information: [phone] Anticipated Caregiver: Sanders Manninen Anticipated Caregiver's Contact Information:  [phone] Ability/Limitations of Caregiver: Works days, Pt has aide 9-1, may hire mroe care Caregiver Availability: 24/7 Discharge Plan Discussed with Primary Caregiver: Yes Is Caregiver In Agreement with Plan?: Yes Does Caregiver/Family have Issues with Lodging/Transportation while Pt is in Rehab?: No  Goals Patient/Family Goal for Rehab: PT/OT Mod I Expected length of stay: 10-12 days Pt/Family Agrees to Admission and willing to participate: Yes Program Orientation Provided & Reviewed with Pt/Caregiver Including Roles  & Responsibilities: Yes  Decrease burden of Care through IP rehab admission: n/a  Possible need for SNF placement upon discharge: not anticipated  Patient Condition: I have reviewed medical records from [facility], spoken with CM, and patient and spouse. I met with patient at the bedside for inpatient rehabilitation assessment.  Patient will benefit from ongoing PT, OT, and SLP, can actively participate in 3 hours of therapy a day 5 days of the week, and can  make measurable gains during the admission.  Patient will also benefit from the coordinated team approach during an Inpatient Acute Rehabilitation admission.  The patient will receive intensive therapy as well as Rehabilitation physician, nursing, social worker, and care management interventions.  Due to safety, skin/wound care, disease management, medication administration, pain management, and patient education the patient requires 24 hour a day rehabilitation nursing.  The patient is currently min A with mobility and basic ADLs.  Discharge setting and therapy post discharge at home with home health is anticipated.  Patient has agreed to participate in the Acute Inpatient Rehabilitation Program and will admit {Time; today/tomorrow:10263}.  Preadmission Screen Completed By:  Genella Mech, 03/17/2022 9:57 AM ______________________________________________________________________   Discussed status with  Dr. Marland Kitchen on *** at *** and received approval for admission today.  Admission Coordinator:  Genella Mech, CCC-SLP, time Marland KitchenSudie Grumbling ***   Assessment/Plan: Diagnosis: Does the need for close, 24 hr/day Medical supervision in concert with the patient's rehab needs make it unreasonable for this patient to be served in a less intensive setting? {yes_no_potentially:3041433} Co-Morbidities requiring supervision/potential complications: *** Due to {due RW:4315400}, does the patient require 24 hr/day rehab nursing? {yes_no_potentially:3041433} Does the patient require coordinated care of a physician, rehab nurse, PT, OT, and SLP to address physical and functional deficits in the context of the above medical diagnosis(es)? {yes_no_potentially:3041433} Addressing deficits in the following areas: {deficits:3041436} Can the patient actively participate in an intensive therapy program of at least 3 hrs of therapy 5 days a week? {yes_no_potentially:3041433} The potential for patient to make measurable gains while on inpatient rehab is {potential:3041437} Anticipated functional outcomes upon discharge from inpatient rehab: {functional outcomes:304600100} PT, {functional outcomes:304600100} OT, {functional outcomes:304600100} SLP Estimated rehab length of stay to reach the above functional goals is: *** Anticipated discharge destination: {anticipated dc setting:21604} 10. Overall Rehab/Functional Prognosis: {potential:3041437}   MD Signature: ***

## 2022-03-17 NOTE — Progress Notes (Signed)
[facility] Progress Note  Subjective:  SCr stable / flat over past 24h! UOP 1.5L! K 4.2 In good spirits  Vitals:   03/16/22 1518 03/16/22 2156 03/17/22 0600 03/17/22 0754  BP: (!) 163/70 (!) 153/67 (!) 146/70 (!) 154/70  Pulse: 78 82 65 68  Resp: 20     Temp: 97.8 F (36.6 C) 98 F (36.7 C) 98 F (36.7 C)   TempSrc: Oral Oral Axillary   SpO2: 96% 96% 98% 97%  Weight:   124 kg   Height:        Exam: Gen is resting, awakens to voice, affable and engaging No rash, cyanosis or gangrene Sclera anicteric, throat clear  No jvd or bruits Chest clear bilat to bases, no rales/ wheezing RRR no MRG Abd soft ntnd no mass or ascites +bs GU normal male MS no joint effusions or deformity Ext No sig LEE Neuro as above, has movement disorder (restlessness) R IJ TDC C/D/I      Home meds include - coreg 12.5 bid, lasix 20 mg mwf, austedo, synthorid, creon, myfortic 180 bid, prednisone 5 qd, tacrolimus 1.5 bid       Date                           Creat               eGFR   2017-2018                 1.11- 1.56   2020                          1.88   2021                          1.54- 2.09   Sept 2023                  3.56                 18 ml/min    11/1- 02/28/22           4.38 >> 3.28    AKI on CKD4, 14 >> 20 ml/min    11/17                                    5.90                 10     11/18                                    5.78                 10       UA negative    UNa <10, UCr 151    CXR - IMPRESSION: Very low lung volumes. Bilateral patchy airspace opacities may represent a combination of bronchovascular crowding, pulmonary edema, and possible multifocal infection.     Temp 992, wBC 4K  , BP 140/70, HR 60 -70, RR        Assessment/ Plan: AKI on CKD 4 transplant - Started CRRT 03/11/22 - 11/21:  b/l creatinine 3.3- 3.6, eGFR 18- 20 ml/min.  Creat here 5.9 on admission 11/17  in the setting of SOB and recent admission for PNA rx'd w/ abx. Likely ATN in  setting of advanced CKD UA neg, urine lytes c/w pre-renal.  11/20 Tplt renal US normal/ no HN IR consulted for [facility], R IJ placed 11/22 Seems to have recovered GFR and SCr is not increasing, will watch another day and then can likely arrange [facility] removal. Renal transplant - done around 2010, cont prograf, myfortic and pred.  Renal US stable. Cont transplant meds.  DM2 - on trulicity, insulin pump at home. Per pmd HTN - cont coreg, amlodipine and hydralazine, holding lasix Movement disorder - chronic issue, this is not myoclonus but is his baseline "chorea" per the sister. Getting Hillery Aldo, MD  03/17/2022, 9:44 AM   Recent Labs  Lab 03/16/22 0143 03/16/22 1607 03/17/22 0204  HGB 7.8*  --  7.8*  ALBUMIN 2.7* 3.2* 2.7*  CALCIUM 7.6* 8.4* 7.8*  PHOS 3.6 3.3 3.2  CREATININE 3.77* 3.67* 3.69*  K 4.0 4.3 4.2    No results for input(s): "IRON", "TIBC", "FERRITIN" in the last 168 hours. Inpatient medications:  amLODipine  10 mg Oral Daily   atorvastatin  10 mg Oral Daily   Chlorhexidine Gluconate Cloth  6 each Topical Daily   Deutetrabenazine  6 mg Oral BID   feeding supplement  237 mL Oral BID BM   heparin  5,000 Units Subcutaneous Q8H   hydrALAZINE  50 mg Oral TID   insulin aspart  0-5 Units Subcutaneous QHS   insulin aspart  0-9 Units Subcutaneous TID WC   levothyroxine  112 mcg Oral Q0600   lipase/protease/amylase  72,000 Units Oral TID WC   melatonin  3 mg Oral QHS   multivitamin  1 tablet Oral QHS   mycophenolate  180 mg Oral BID   nystatin   Topical TID   mouth rinse  15 mL Mouth Rinse 4 times per day   predniSONE  5 mg Oral Q breakfast   QUEtiapine  50 mg Oral QHS   senna  1 tablet Oral BID   tacrolimus  1.5 mg Oral BID     acetaminophen, albuterol, haloperidol lactate, hydrALAZINE, lidocaine (PF), naphazoline-glycerin, ondansetron **OR** ondansetron (ZOFRAN) IV, mouth rinse, polyethylene glycol

## 2022-03-17 NOTE — Progress Notes (Signed)
Nathan Tyler  VEL:[id number] DOB: November 03, 1955 DOA: 03/09/2022 PCP: Celene Squibb, MD    Brief Narrative:  66 year old with a history of CKD status post transplant, chorea, DM2, HTN, HLD, and hypothyroidism who presented to the hospital 11/17 with shortness of breath following a hospitalization 11/1 > 11/8 with community-acquired pneumonia.  His evaluation in the ER was suggestive of volume overload and he was found to be suffering with acutely worsening renal failure.  Consultants:  Nephrology IR  Goals of Care:  Code Status: DNR   DVT prophylaxis: Subcutaneous heparin  Interim Hx: Increasing urine output has been noted over the last 24 hours, with creatinine stable.  Patient is in good spirits with no new complaints.  He denies chest pain or shortness of breath.  He has noted increasing urine output and is very excited about this.  Assessment & Plan:  Acute kidney failure on CKD stage IV in a transplant kidney Baseline creatinine 3-4 - presented with creatinine at 5.9 - tunneled dialysis catheter placed 11/22 by IR - CRRT was being administered at [facility] -transferred to [facility] for potential need for full hemodialysis -nephrology is presently monitoring without dialysis and at this time it appears that his renal function is recovering -etiology is felt to be ATN related to recent illness in setting of advanced CKD  Acute hypoxic respiratory failure due to pulmonary edema As evidenced by significant dyspnea and requirement for 4 L nasal cannula to keep saturations at 90% -CXR at presentation consistent with pulmonary edema -has improved with CRRT -weaned to room air   Acute metabolic encephalopathy due to sundowning, hypoxia, and uremia/renal failure Required Precedex during initial portion of hospital stay -utilized low-dose Haldol with close monitoring for worsening of chorea -mental status much improved/stable at baseline presently  Status post renal transplant 2008  -chronically immunosuppressed On tacrolimus, mycophenolate, and low-dose prednisone chronically  Anemia of chronic kidney disease No evidence of acute blood loss at present  DM2 with hyperglycemia A1c 5.7 -likely due to use of Trulicity in the setting of worsening renal function - CBG variable -gently adjust treatment regimen and follow  HTN Blood pressure trending upward -adjust treatment plan and follow  HLD Continue Lipitor  Hypothyroidism Continue usual dose of Synthroid  Chorea Continue home dose of Austedo  Family Communication: No family present Disposition:  from home - plan for CIR stay before return home    Objective: Blood pressure (!) 154/70, pulse 68, temperature 98 F (36.7 C), temperature source Axillary, resp. rate 20, height '5\' 11"'$  (1.803 m), weight 124 kg, SpO2 97 %.  Intake/Output Summary (Last 24 hours) at 03/17/2022 1029 Last data filed at 03/17/2022 0650 Gross per 24 hour  Intake --  Output 1451 ml  Net -1451 ml    Filed Weights   03/15/22 0625 03/16/22 0429 03/17/22 0600  Weight: 109.4 kg 110 kg 124 kg    Examination: General: No acute respiratory distress Lungs: Clear to auscultation bilaterally  Cardiovascular: Regular rate and rhythm without murmur  Abdomen: Nontender, nondistended, soft, bowel sounds positive Extremities: No significant edema bilateral lower extremities  CBC: Recent Labs  Lab 03/15/22 0544 03/16/22 0143 03/17/22 0204  WBC 6.0 6.1 6.4  HGB 7.5* 7.8* 7.8*  HCT 25.9* 25.0* 24.4*  MCV 99.6 96.5 94.9  PLT 185 180 258    Basic Metabolic Panel: Recent Labs  Lab 03/15/22 0544 03/15/22 1552 03/16/22 0143 03/16/22 1607 03/17/22 0204  NA 139   < > 135 139 137  K 3.9   < > 4.0 4.3 4.2  CL 109   < > 104 103 103  CO2 26   < > '24 23 24  '$ GLUCOSE 200*   < > 278* 323* 307*  BUN 56*   < > 58* 61* 63*  CREATININE 3.37*   < > 3.77* 3.67* 3.69*  CALCIUM 7.8*   < > 7.6* 8.4* 7.8*  MG 2.3  --  2.5*  --  2.6*  PHOS 3.9    < > 3.6 3.3 3.2   < > = values in this interval not displayed.    GFR: Estimated Creatinine Clearance: 25.7 mL/min (A) (by C-G formula based on SCr of 3.69 mg/dL (H)).   Scheduled Meds:  amLODipine  10 mg Oral Daily   atorvastatin  10 mg Oral Daily   Chlorhexidine Gluconate Cloth  6 each Topical Daily   Deutetrabenazine  6 mg Oral BID   feeding supplement  237 mL Oral BID BM   heparin  5,000 Units Subcutaneous Q8H   hydrALAZINE  50 mg Oral TID   insulin aspart  0-5 Units Subcutaneous QHS   insulin aspart  0-9 Units Subcutaneous TID WC   levothyroxine  112 mcg Oral Q0600   lipase/protease/amylase  72,000 Units Oral TID WC   melatonin  3 mg Oral QHS   multivitamin  1 tablet Oral QHS   mycophenolate  180 mg Oral BID   nystatin   Topical TID   mouth rinse  15 mL Mouth Rinse 4 times per day   predniSONE  5 mg Oral Q breakfast   QUEtiapine  50 mg Oral QHS   senna  1 tablet Oral BID   tacrolimus  1.5 mg Oral BID      LOS: 8 days   Nathan Altes, MD [facility] Office  [phone] Pager - Text Page per Shea Evans  If 7PM-7AM, please contact night-coverage per Amion 03/17/2022, 10:29 AM

## 2022-03-17 NOTE — Progress Notes (Signed)
Inpatient Rehab Admissions Coordinator:  ? ?Per therapy recommendations,  patient was screened for CIR candidacy by Messiah Ahr, MS, CCC-SLP. At this time, Pt. Appears to be a a potential candidate for CIR. I will place   order for rehab consult per protocol for full assessment. Please contact me any with questions. ? ?Tedford Berg, MS, CCC-SLP ?Rehab Admissions Coordinator  ?[phone] (celll) ?[phone] (office) ? ?

## 2022-03-17 NOTE — TOC Initial Note (Signed)
Transition of Care [facility]) - Initial/Assessment Note    Patient Details  Name: Nathan Tyler MRN: [id number] Date of Birth: 06-Mar-1956  Transition of Care [facility]) CM/SW Contact:    Benard Halsted, LCSW Phone Number: 03/17/2022, 11:46 AM  Clinical Narrative:                 CSW met with patient to discuss SNF rehab. 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 CSW explained that it would depend on his discharge date and location. He agreed to [facility] rehab. CSW will present bed offers as available with Medicare ratings list.  PT recommendation now changed to CIR. CSW will continue to follow.    Expected Discharge Plan: IP Rehab Facility Barriers to Discharge: Continued Medical Work up (CIR eval vs snf)   Patient Goals and CMS Choice Patient states their goals for this hospitalization and ongoing recovery are:: Rehab CMS Medicare.gov Compare Post Acute Care list provided to:: Patient Choice offered to / list presented to : Patient  Expected Discharge Plan and Services Expected Discharge Plan: [place] In-house Referral: Clinical Social Work   Post Acute Care Choice: IP Rehab, [place] Living arrangements for the past 2 months: Single Family Home                                      Prior Living Arrangements/Services Living arrangements for the past 2 months: Single Family Home Lives with:: Spouse Patient language and need for interpreter reviewed:: Yes Do you feel safe going back to the place where you live?: Yes      Need for Family Participation in Patient Care: Yes (Comment) Care giver support system in place?: Yes (comment)   Criminal Activity/Legal Involvement Pertinent to Current Situation/Hospitalization: No - Comment as needed  Activities of Daily Living Home Assistive Devices/Equipment: None ADL Screening (condition at time of admission) Patient's cognitive ability adequate  to safely complete daily activities?: Yes Is the patient deaf or have difficulty hearing?: Yes Does the patient have difficulty seeing, even when wearing glasses/contacts?: No Does the patient have difficulty concentrating, remembering, or making decisions?: No Patient able to express need for assistance with ADLs?: Yes Does the patient have difficulty dressing or bathing?: Yes Independently performs ADLs?: No Communication: Independent Dressing (OT): Needs assistance Is this a change from baseline?: Change from baseline, expected to last >3 days Grooming: Independent Feeding: Independent Bathing: Needs assistance Is this a change from baseline?: Change from baseline, expected to last >3 days Toileting: Independent In/Out Bed: Independent Walks in Home: Independent Does the patient have difficulty walking or climbing stairs?: Yes Weakness of Legs: Both Weakness of Arms/Hands: Both  Permission Sought/Granted Permission sought to share information with : Facility Art therapist granted to share information with : Yes, Verbal Permission Granted     Permission granted to share info w AGENCY: SNFs        Emotional Assessment Appearance:: Appears stated age Attitude/Demeanor/Rapport: Engaged Affect (typically observed): Accepting, Appropriate Orientation: : Oriented to Self, Oriented to Place, Oriented to  Time, Oriented to Situation Alcohol / Substance Use: Not Applicable Psych Involvement: No (comment)  Admission diagnosis:  Hypoxia [R09.02] Acute exacerbation of CHF (congestive heart failure) (HCC) [I50.9] Patient Active Problem List   Diagnosis Date Noted   Malnutrition of moderate degree 03/12/2022   Acute renal failure (  [place]) 03/11/2022   Acute renal failure superimposed on stage 4 chronic kidney disease ([place]) 02/22/2022   Acute on chronic anemia 69/79/4801   Acute metabolic encephalopathy 65/53/7482   Hyperkalemia 02/22/2022   DM2 (diabetes mellitus,  type 2) ([place]) 02/22/2022   Chronic diastolic CHF (congestive heart failure) ([place]) 02/22/2022   CAP (community acquired pneumonia) 02/21/2022   Change in bowel habits 11/15/2021   Overweight (BMI 25.0-29.9) 06/03/2019   Acute respiratory failure with hypoxia ([place]) 06/03/2019   Hypothyroidism    COVID-19 virus infection 05/31/2019   Essential hypertension 05/31/2019   CKD (chronic kidney disease) 05/31/2019   Renal transplant, status post 05/31/2019   HLD (hyperlipidemia) 05/31/2019   H/O insulin dependent diabetes mellitus 05/31/2019   Chorea 05/31/2019   Diarrhea 09/02/2018   History of colonic polyps 10/25/2017   HCV infection 10/25/2017   Hepatitis C antibody positive in blood 03/12/2017   Elevated LFTs 03/12/2017   PCP:  Celene Squibb, MD Pharmacy:   [id number] [place] [id number]: [phone]: [phone]    Social Determinants of Health (SDOH) Interventions    Readmission Risk Interventions    03/14/2022    9:17 AM 02/28/2022   10:24 AM  Readmission Risk Prevention Plan  Transportation Screening Complete Complete  PCP or Specialist Appt within 5-7 Days  Complete  Home Care Screening  Complete  Medication Review (RN CM)  Complete  Medication Review (RN Care Manager) Complete   HRI or Home Care Consult Complete   SW Recovery Care/Counseling Consult Complete   Palliative Care Screening Not [place] Home[place] Applicable

## 2022-03-17 NOTE — Progress Notes (Signed)
Inpatient Rehab Admissions Coordinator:    I met with Pt. And spoke with wife regarding potential CIR admit. They are interested but I do not have a bed for him today. I will follow for potential admit pending bed availability.   Clemens Catholic, [place], [place] Admissions Coordinator  [phone] ([place]) [phone] (office)

## 2022-03-18 DIAGNOSIS — N184 Chronic kidney disease, stage 4 (severe): Secondary | ICD-10-CM | POA: Diagnosis not present

## 2022-03-18 DIAGNOSIS — N179 Acute kidney failure, unspecified: Secondary | ICD-10-CM | POA: Diagnosis not present

## 2022-03-18 LAB — RENAL FUNCTION PANEL
Albumin: 2.7 g/dL — ABNORMAL LOW (ref 3.5–5.0)
Albumin: 3 g/dL — ABNORMAL LOW (ref 3.5–5.0)
Anion gap: 8 (ref 5–15)
Anion gap: 8 (ref 5–15)
BUN: 63 mg/dL — ABNORMAL HIGH (ref 8–23)
BUN: 63 mg/dL — ABNORMAL HIGH (ref 8–23)
CO2: 24 mmol/L (ref 22–32)
CO2: 24 mmol/L (ref 22–32)
Calcium: 7.6 mg/dL — ABNORMAL LOW (ref 8.9–10.3)
Calcium: 8.1 mg/dL — ABNORMAL LOW (ref 8.9–10.3)
Chloride: 105 mmol/L (ref 98–111)
Chloride: 108 mmol/L (ref 98–111)
Creatinine, Ser: 3.62 mg/dL — ABNORMAL HIGH (ref 0.61–1.24)
Creatinine, Ser: 3.7 mg/dL — ABNORMAL HIGH (ref 0.61–1.24)
GFR, Estimated: 18 mL/min — ABNORMAL LOW (ref >60)
GFR, Estimated: 17 mL/min — ABNORMAL LOW (ref >60)
Glucose, Bld: 318 mg/dL — ABNORMAL HIGH (ref 70–99)
Glucose, Bld: 185 mg/dL — ABNORMAL HIGH (ref 70–99)
Phosphorus: 3.6 mg/dL (ref 2.5–4.6)
Phosphorus: 3.5 mg/dL (ref 2.5–4.6)
Potassium: 4.5 mmol/L (ref 3.5–5.1)
Potassium: 4.7 mmol/L (ref 3.5–5.1)
Sodium: 137 mmol/L (ref 135–145)
Sodium: 140 mmol/L (ref 135–145)

## 2022-03-18 LAB — GLUCOSE, CAPILLARY
Glucose-Capillary: 271 mg/dL — ABNORMAL HIGH (ref 70–99)
Glucose-Capillary: 209 mg/dL — ABNORMAL HIGH (ref 70–99)
Glucose-Capillary: 159 mg/dL — ABNORMAL HIGH (ref 70–99)
Glucose-Capillary: 170 mg/dL — ABNORMAL HIGH (ref 70–99)
Glucose-Capillary: 189 mg/dL — ABNORMAL HIGH (ref 70–99)

## 2022-03-18 LAB — MAGNESIUM: Magnesium: 2.5 mg/dL — ABNORMAL HIGH (ref 1.7–2.4)

## 2022-03-18 MED ORDER — INSULIN GLARGINE-YFGN 100 UNIT/ML ~~LOC~~ SOLN
6.0000 [IU] | Freq: Every day | SUBCUTANEOUS | Status: DC
  Administered 2022-03-18 – 2022-03-19 (×2): 6 [IU] via SUBCUTANEOUS
  Filled 2022-03-18 (×4): qty 0.06

## 2022-03-18 NOTE — Progress Notes (Signed)
PROGRESS NOTE Nathan Tyler  OMV:[id number] DOB: [birth year] DOA: 03/09/2022 PCP: Celene Squibb, MD   Brief Narrative/Hospital Course: 66 year old male with PMH CKD4 s/p transplant, chorea, DM II, HTN, HLD, hypothyroidism who presented with SOB. He was recently been hospitalized from 11/1 - 11/8 as well for CAP and completed antibiotics in the hospital and was discharged home with home health. Seen in the ER, CXR was more concerning for volume overload and he was found to have worsened renal function.  He was evaluated by nephrology and admitted for acute hypoxic respiratory failure, acute metabolic encephalopathy and started on CRRT for fluid overload and acutely worsening renal failure on the CT of CKD stage IV in a transplant kidney. Patient was transferred from [facility] to [facility] hospital 03/15/22.   Subjective: Seen and exained Doing well on [place] No chest pain shortness of breath No pain Had BM last night Asking when he will be able to go home   Assessment and Plan: Principal Problem:   Acute renal failure superimposed on stage 4 chronic kidney disease (HCC) Active Problems:   Acute respiratory failure with hypoxia (HCC)   Acute on chronic anemia   DM2 (diabetes mellitus, type 2) (HCC)   Acute renal failure (HCC)   Malnutrition of moderate degree   AKI on CKD stage IV on transplant kidney: Creatinine peaked to 5.9, seen by nephrology.On CRRT 11/19-11/21.  UA negative.  Urine lites consistent with prerenal AKI, ultrasound normal.  Right IJ placed 11/22 for TDC.  Patient seems to have recovered GFR and creatinine is overall stable, nephrology following likely arrange for [facility] removal 11/26, as patient has had renal recovery. Recent Labs  Lab 03/16/22 0143 03/16/22 1607 03/17/22 0204 03/17/22 1611 03/18/22 0212  BUN 58* 61* 63* 59* 63*  CREATININE 3.77* 3.67* 3.69* 3.55* 3.62*    Acute hypoxic respiratory failure due to pulmonary edema: With significant dyspnea fluid overload.  Improved with  CRRT wean off to room air as tolerated.  Encourage ambulation.  Lower leg edema 2+ pitting edema, appreciate nephrology input fluid/diuretic management per nephrology, likely hypoalbuminemia contributing.  Mobilize, augment diet.  Acute metabolic encephalopathy due to sundowning hypoxia uremia renal failure needing Precedex initially.  At this time overall stable-utilize low-dose Haldol with close monitoring for worsening of chorea    Status post renal transplant 2008 -chronically immunosuppressed on tacrolimus, mycophenolate, and low-dose prednisone.   Anemia of chronic kidney disease: stable DM2 with poorly controlled hyperglycemia: Blood sugar uncontrolled, patient on prednisone, SSI: Add low-dose Semglee, monitor blood labile AKI recovering.  A1c 5.7.  At home on 5 units 3 times daily short-acting, Trulicity weekly. Recent Labs  Lab 03/17/22 1149 03/17/22 1718 03/17/22 2135 03/18/22 0727 03/18/22 1132  GLUCAP 252* 299* 297* 271* 209*     Lab Results  Component Value Date   HGBA1C 5.7 (H) 02/22/2022     HTN: stable  GGE:ZMOQHUTM Lipitor Hypothyroidism: Continue home dose of Synthroid Chorea:Continue home dose of Austedo  Class II Obesity:Patient's Body mass index is 38.13 kg/m. : Will benefit with PCP follow-up, weight loss  healthy lifestyle and outpatient sleep evaluation.  Deconditioning/debility continue PT OT recommending CIR  Moderate malnutrition augment diet as below Nutrition Problem: Moderate Malnutrition Etiology: chronic illness (renal transplant 2008 with CKD 4 on chonic immunosuppression) Signs/Symptoms: mild muscle depletion, mild fat depletion Interventions: Liberalize Diet, Refer to RD note for recommendations, MVI, Ensure Enlive (each supplement provides 350kcal and 20 grams of protein) DVT prophylaxis: heparin injection 5,000 Units Start: 03/09/22 2200  Code Status:   Code Status: DNR Family Communication: plan of care discussed with patient/sister at  bedside. Patient status is: Inpatient because of acute renal failure Level of care: Med-Surg   Dispo: The patient is from: home w/ wife            Anticipated disposition: CIR tomorrow if bed available. Objective: Vitals last 24 hrs: Vitals:   03/17/22 2251 03/18/22 0607 03/18/22 0836 03/18/22 1145  BP:  139/68 (!) 140/68   Pulse: 68 64 63   Resp:  16  18  Temp:  97.8 F (36.6 C)  97.8 F (36.6 C)  TempSrc:  Oral  Oral  SpO2:  90%    Weight:      Height:       Weight change:   Physical Examination: General exam: alert awake, oriented x3 older than stated age HEENT:Oral mucosa moist, Ear/Nose WNL grossly Respiratory system: bilaterally clear BS, no use of accessory muscle Cardiovascular system: S1 & S2 +, No JVD. Gastrointestinal system: Abdomen soft,NT,ND, BS+ Nervous System:Alert, awake, moving extremities. Extremities: LE edema ++,distal peripheral pulses palpable.  Skin: No rashes,no icterus. MSK: Normal muscle bulk,tone, power RT CHEST W/ HD Catheter  Medications reviewed:  Scheduled Meds:  amLODipine  10 mg Oral Daily   atorvastatin  10 mg Oral Daily   carvedilol  12.5 mg Oral BID   Chlorhexidine Gluconate Cloth  6 each Topical Daily   Deutetrabenazine  6 mg Oral BID   feeding supplement  237 mL Oral BID BM   heparin  5,000 Units Subcutaneous Q8H   hydrALAZINE  50 mg Oral TID   insulin aspart  0-15 Units Subcutaneous TID WC   levothyroxine  112 mcg Oral Q0600   lipase/protease/amylase  72,000 Units Oral TID WC   melatonin  3 mg Oral QHS   multivitamin  1 tablet Oral QHS   mycophenolate  180 mg Oral BID   nystatin   Topical TID   predniSONE  5 mg Oral Q breakfast   QUEtiapine  50 mg Oral QHS   senna  1 tablet Oral BID   tacrolimus  1.5 mg Oral BID   Continuous Infusions:    Diet Order             Diet renal/carb modified with fluid restriction Diet-HS Snack? Nothing; Fluid restriction: 1200 mL Fluid; Room service appropriate? Yes; Fluid consistency:  Thin  Diet effective now                  Intake/Output Summary (Last 24 hours) at 03/18/2022 1150 Last data filed at 03/18/2022 0616 Gross per 24 hour  Intake 480 ml  Output 975 ml  Net -495 ml   Net IO Since Admission: -6,594.85 mL [03/18/22 1150]  Wt Readings from Last 3 Encounters:  03/17/22 124 kg  02/22/22 104.3 kg  11/24/21 107 kg     Unresulted Labs (From admission, onward)     Start     Ordered   03/12/22 1600  Renal function panel (daily at 1600)  Daily at 1600,   R     Question:  Specimen collection method  Answer:  Lab=Lab collect   03/11/22 1626   03/12/22 0500  Renal function panel (daily at 0500)  Daily at 5am,   R     Question:  Specimen collection method  Answer:  Lab=Lab collect   03/11/22 1626   03/12/22 0500  Magnesium  Daily at 5am,   R     Question:  Specimen collection method  Answer:  Lab=Lab collect   03/11/22 1626          Data Reviewed: I have personally reviewed following labs and imaging studies CBC: Recent Labs  Lab 03/13/22 0658 03/14/22 0726 03/15/22 0544 03/16/22 0143 03/17/22 0204  WBC 6.7 5.7 6.0 6.1 6.4  HGB 8.5* 8.1* 7.5* 7.8* 7.8*  HCT 27.7* 26.9* 25.9* 25.0* 24.4*  MCV 97.2 98.5 99.6 96.5 94.9  PLT 186 184 185 180 503   Basic Metabolic Panel: Recent Labs  Lab 03/14/22 0726 03/14/22 1021 03/15/22 0544 03/15/22 1552 03/16/22 0143 03/16/22 1607 03/17/22 0204 03/17/22 1611 03/18/22 0212  NA 143   < > 139   < > 135 139 137 138 137  K 3.8   < > 3.9   < > 4.0 4.3 4.2 4.4 4.5  CL 112*   < > 109   < > 104 103 103 105 105  CO2 26   < > 26   < > '24 23 24 25 24  '$ GLUCOSE 135*   < > 200*   < > 278* 323* 307* 282* 318*  BUN 50*   < > 56*   < > 58* 61* 63* 59* 63*  CREATININE 2.61*   < > 3.37*   < > 3.77* 3.67* 3.69* 3.55* 3.62*  CALCIUM 8.0*   < > 7.8*   < > 7.6* 8.4* 7.8* 8.0* 7.6*  MG 2.6*  --  2.3  --  2.5*  --  2.6*  --  2.5*  PHOS 3.7   < > 3.9   < > 3.6 3.3 3.2 3.1 3.6   < > = values in this interval not  displayed.   No results found for this or any previous visit (from the past 240 hour(s)).  Antimicrobials: Anti-infectives (From admission, onward)    Start     Dose/Rate Route Frequency Ordered Stop   03/14/22 1315  ceFAZolin (ANCEF) IVPB 2g/100 mL premix        2 g 200 mL/hr over 30 Minutes Intravenous On call to O.R. 03/14/22 1300 03/14/22 1400   03/11/22 1600  levofloxacin (LEVAQUIN) IVPB 500 mg  Status:  Discontinued        500 mg 100 mL/hr over 60 Minutes Intravenous Every 48 hours 03/09/22 1808 03/11/22 1539   03/09/22 1600  levofloxacin (LEVAQUIN) IVPB 500 mg        500 mg 100 mL/hr over 60 Minutes Intravenous  Once 03/09/22 1551 03/09/22 1730      Culture/Microbiology    Component Value Date/Time   SDES BLOOD LEFT FOREARM 02/21/2022 2236   SPECREQUEST  02/21/2022 2236    BOTTLES DRAWN AEROBIC AND ANAEROBIC Blood Culture adequate volume   CULT  02/21/2022 2236    NO GROWTH 5 DAYS Performed at [facility] [address], [place], [place] [address]    REPTSTATUS 02/26/2022 FINAL 02/21/2022 2236    Radiology Studies: No results found.   LOS: 9 days   Antonieta Pert, MD [facility]  03/18/2022, 11:50 AM

## 2022-03-18 NOTE — Progress Notes (Signed)
[facility] Progress Note  Subjective:  SCr stable / flat over past 48h UOP 1.0L Still has R IJ TDC  Vitals:   03/17/22 2136 03/17/22 2251 03/18/22 0607 03/18/22 0836  BP: (!) 169/77  139/68 (!) 140/68  Pulse: 66 68 64 63  Resp: 16  16   Temp: 98.4 F (36.9 C)  97.8 F (36.6 C)   TempSrc: Oral  Oral   SpO2: 91%  90%   Weight:      Height:        Exam: Gen is resting, awakens to voice, affable and engaging No rash, cyanosis or gangrene Sclera anicteric, throat clear  No jvd or bruits Chest clear bilat to bases, no rales/ wheezing RRR no MRG Abd soft ntnd no mass or ascites +bs GU normal male MS no joint effusions or deformity Ext No sig LEE Neuro as above, has movement disorder (restlessness) R IJ TDC C/D/I      Home meds include - coreg 12.5 bid, lasix 20 mg mwf, austedo, synthorid, creon, myfortic 180 bid, prednisone 5 qd, tacrolimus 1.5 bid       Date                           Creat               eGFR   2017-2018                 1.11- 1.56   2020                          1.88   2021                          1.54- 2.09   Sept 2023                  3.56                 18 ml/min    11/1- 02/28/22           4.38 >> 3.28    AKI on CKD4, 14 >> 20 ml/min    11/17                                    5.90                 10     11/18                                    5.78                 10       UA negative    UNa <10, UCr 151    CXR - IMPRESSION: Very low lung volumes. Bilateral patchy airspace opacities may represent a combination of bronchovascular crowding, pulmonary edema, and possible multifocal infection.       Assessment/ Plan: AKI on CKD 4 transplant - Started CRRT 03/11/22 - 11/21:  b/l creatinine 3.3- 3.6, eGFR 18- 20 ml/min.  Creat here 5.9 on admission 11/17 in the setting of SOB and recent admission for PNA rx'd w/ abx. Likely ATN in setting of advanced CKD UA neg, urine  lytes c/w pre-renal.  11/20 Tplt renal US normal/ no HN IR  consulted for [facility], R IJ placed 11/22 Seems to have recovered GFR and SCr is not increasing, no longer req HD.  Will ask IR to remove TDC.   Renal transplant - done around 2010, cont prograf, myfortic and pred.  Renal US stable. Cont transplant meds.  DM2 - on trulicity, insulin pump at home. Per pmd HTN - cont coreg, amlodipine and hydralazine, holding lasix Movement disorder - chronic issue, this is not myoclonus but is his baseline "chorea" per the sister. Getting Hillery Aldo, MD  03/18/2022, 10:18 AM   Recent Labs  Lab 03/16/22 0143 03/16/22 1607 03/17/22 0204 03/17/22 1611 03/18/22 0212  HGB 7.8*  --  7.8*  --   --   ALBUMIN 2.7*   < > 2.7* 2.9* 2.7*  CALCIUM 7.6*   < > 7.8* 8.0* 7.6*  PHOS 3.6   < > 3.2 3.1 3.6  CREATININE 3.77*   < > 3.69* 3.55* 3.62*  K 4.0   < > 4.2 4.4 4.5   < > = values in this interval not displayed.    No results for input(s): "IRON", "TIBC", "FERRITIN" in the last 168 hours. Inpatient medications:  amLODipine  10 mg Oral Daily   atorvastatin  10 mg Oral Daily   carvedilol  12.5 mg Oral BID   Chlorhexidine Gluconate Cloth  6 each Topical Daily   Deutetrabenazine  6 mg Oral BID   feeding supplement  237 mL Oral BID BM   heparin  5,000 Units Subcutaneous Q8H   hydrALAZINE  50 mg Oral TID   insulin aspart  0-15 Units Subcutaneous TID WC   levothyroxine  112 mcg Oral Q0600   lipase/protease/amylase  72,000 Units Oral TID WC   melatonin  3 mg Oral QHS   multivitamin  1 tablet Oral QHS   mycophenolate  180 mg Oral BID   nystatin   Topical TID   predniSONE  5 mg Oral Q breakfast   QUEtiapine  50 mg Oral QHS   senna  1 tablet Oral BID   tacrolimus  1.5 mg Oral BID     acetaminophen, albuterol, hydrALAZINE, naphazoline-glycerin, ondansetron **OR** ondansetron (ZOFRAN) IV, polyethylene glycol

## 2022-03-19 ENCOUNTER — Inpatient Hospital Stay (HOSPITAL_COMMUNITY): Payer: Medicare Other

## 2022-03-19 DIAGNOSIS — N184 Chronic kidney disease, stage 4 (severe): Secondary | ICD-10-CM | POA: Diagnosis not present

## 2022-03-19 DIAGNOSIS — N179 Acute kidney failure, unspecified: Secondary | ICD-10-CM | POA: Diagnosis not present

## 2022-03-19 HISTORY — PX: IR REMOVAL TUN CV CATH W/O FL: IMG2289

## 2022-03-19 LAB — GLUCOSE, CAPILLARY
Glucose-Capillary: 138 mg/dL — ABNORMAL HIGH (ref 70–99)
Glucose-Capillary: 205 mg/dL — ABNORMAL HIGH (ref 70–99)
Glucose-Capillary: 270 mg/dL — ABNORMAL HIGH (ref 70–99)
Glucose-Capillary: 313 mg/dL — ABNORMAL HIGH (ref 70–99)

## 2022-03-19 LAB — RENAL FUNCTION PANEL
Albumin: 2.7 g/dL — ABNORMAL LOW (ref 3.5–5.0)
Albumin: 3 g/dL — ABNORMAL LOW (ref 3.5–5.0)
Anion gap: 6 (ref 5–15)
Anion gap: 8 (ref 5–15)
BUN: 67 mg/dL — ABNORMAL HIGH (ref 8–23)
BUN: 67 mg/dL — ABNORMAL HIGH (ref 8–23)
CO2: 25 mmol/L (ref 22–32)
CO2: 22 mmol/L (ref 22–32)
Calcium: 7.8 mg/dL — ABNORMAL LOW (ref 8.9–10.3)
Calcium: 8.2 mg/dL — ABNORMAL LOW (ref 8.9–10.3)
Chloride: 107 mmol/L (ref 98–111)
Chloride: 107 mmol/L (ref 98–111)
Creatinine, Ser: 3.79 mg/dL — ABNORMAL HIGH (ref 0.61–1.24)
Creatinine, Ser: 4.09 mg/dL — ABNORMAL HIGH (ref 0.61–1.24)
GFR, Estimated: 17 mL/min — ABNORMAL LOW (ref >60)
GFR, Estimated: 15 mL/min — ABNORMAL LOW (ref >60)
Glucose, Bld: 167 mg/dL — ABNORMAL HIGH (ref 70–99)
Glucose, Bld: 290 mg/dL — ABNORMAL HIGH (ref 70–99)
Phosphorus: 4.2 mg/dL (ref 2.5–4.6)
Phosphorus: 4.5 mg/dL (ref 2.5–4.6)
Potassium: 4.5 mmol/L (ref 3.5–5.1)
Potassium: 5.4 mmol/L — ABNORMAL HIGH (ref 3.5–5.1)
Sodium: 138 mmol/L (ref 135–145)
Sodium: 137 mmol/L (ref 135–145)

## 2022-03-19 LAB — CBC
HCT: 26.2 % — ABNORMAL LOW (ref 39.0–52.0)
Hemoglobin: 7.9 g/dL — ABNORMAL LOW (ref 13.0–17.0)
MCH: 29.5 pg (ref 26.0–34.0)
MCHC: 30.2 g/dL (ref 30.0–36.0)
MCV: 97.8 fL (ref 80.0–100.0)
Platelets: 182 10*3/uL (ref 150–400)
RBC: 2.68 MIL/uL — ABNORMAL LOW (ref 4.22–5.81)
RDW: 14.6 % (ref 11.5–15.5)
WBC: 6 10*3/uL (ref 4.0–10.5)
nRBC: 0 % (ref 0.0–0.2)

## 2022-03-19 LAB — MAGNESIUM: Magnesium: 2.6 mg/dL — ABNORMAL HIGH (ref 1.7–2.4)

## 2022-03-19 MED ORDER — FUROSEMIDE 10 MG/ML IJ SOLN
80.0000 mg | Freq: Once | INTRAMUSCULAR | Status: AC
  Administered 2022-03-19: 80 mg via INTRAVENOUS
  Filled 2022-03-19: qty 8

## 2022-03-19 NOTE — Unmapped (Signed)
[facility] Refill Coordination Note    Specialty Medication(s) to be Shipped:   Neurology: [name]    Other medication(s) to be shipped: No additional medications requested for fill at this time     Gary Rogers, DOB: 1955/12/26  Phone: [phone] (home)       All above HIPAA information was verified with patient.     Was a Nurse, learning disability used for this call? No    Completed refill call assessment today to schedule patient's medication shipment from the [facility] [phone]).  All relevant notes have been reviewed.     Specialty medication(s) and dose(s) confirmed: Regimen is correct and unchanged.   Changes to medications: [name] reports no changes at this time.  Changes to insurance: No  New side effects reported not previously addressed with a pharmacist or physician: None reported  Questions for the pharmacist: No    Confirmed patient received a Conservation officer, historic buildings and a Surveyor, mining with first shipment. The patient will receive a drug information handout for each medication shipped and additional FDA Medication Guides as required.       DISEASE/MEDICATION-SPECIFIC INFORMATION        N/A    SPECIALTY MEDICATION ADHERENCE     Medication Adherence    Patient reported X missed doses in the last month: 0  Specialty Medication: AUSTEDO 6 mg  Patient is on additional specialty medications: No  Any gaps in refill history greater than 2 weeks in the last 3 months: no  Demonstrates understanding of importance of adherence: yes  Informant: patient  Reliability of informant: reliable              Confirmed plan for next specialty medication refill: delivery by pharmacy  Refills needed for supportive medications: not needed              Were doses missed due to medication being on hold? No    Austedo 6 mg: 7 days of medicine on hand        REFERRAL TO PHARMACIST     Referral to the pharmacist: Not needed      [facility]     Shipping address confirmed in Epic.     Delivery Scheduled: Yes, Expected medication delivery date: 11/30.     Medication will be delivered via UPS to the prescription address in Epic WAM.    Valere Dross   [facility] Specialty Technician

## 2022-03-19 NOTE — Progress Notes (Signed)
Inpatient Rehab Admissions Coordinator:    CIR following for potential admit pending bed availability. Hopeful for a bed for him tomorrow.   Clemens Catholic, [place], [place] Admissions Coordinator  [phone] (Gisela) [phone] (office)

## 2022-03-19 NOTE — Progress Notes (Signed)
[facility] Progress Note  Subjective:  He confirms his tunneled dialysis catheter was removed.  He had 1 liter UOP over 11/26.  States he follows with Dr. Marval Regal in the clinic.  Review of systems:  Denies shortness of breath or chest pain  Denies n/v  Vitals:   03/19/22 0500 03/19/22 0810 03/19/22 1005 03/19/22 1511  BP:  (!) 147/69  (!) 149/67  Pulse:  63  66  Resp:  19  19  Temp:  97.6 F (36.4 C)  98.3 F (36.8 C)  TempSrc:  Oral  Oral  SpO2:  98% 94% 98%  Weight: 118 kg     Height:        Physical Exam:  General adult male in bed in no acute distress HEENT normocephalic atraumatic extraocular movements intact sclera anicteric Neck supple trachea midline Lungs clear to auscultation bilaterally normal work of breathing at rest  Heart S1S2 no rub Abdomen soft nontender nondistended Extremities 2+ edema lower extremities Psych normal mood and affect Neuro - alert and oriented  GU - foley catheter in place Access - confirmed RIJ tunn catheter has been removed       Home meds include - coreg 12.5 bid, lasix 20 mg mwf, austedo, synthorid, creon, myfortic 180 bid, prednisone 5 qd, tacrolimus 1.5 bid       Date                           Creat               eGFR   2017-2018                 1.11- 1.56   2020                          1.88   2021                          1.54- 2.09   Sept 2023                  3.56                 18 ml/min    11/1- 02/28/22           4.38 >> 3.28    AKI on CKD4, 14 >> 20 ml/min    11/17                                    5.90                 10     11/18                                    5.78                 10       UA negative    UNa <10, UCr 151    CXR - IMPRESSION: Very low lung volumes. Bilateral patchy airspace opacities may represent a combination of bronchovascular crowding, pulmonary edema, and possible multifocal infection.       Assessment/ Plan: 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 Likely ATN in setting of advanced CKD.  UA neg, urine lytes c/w pre-renal.  11/20 Tplt renal US normal/ no hydro.  IR consulted for [facility], R IJ placed 11/22.  CRRT stopped on 11/21 confirmed tunn catheter is out Lasix 80 mg IV once today Discontinue foley catheter  CKD stage IV of renal allograft - baseline Cr 3.3 - 3.6 (eGFR 18-20) per nephrology charting.  Follows with Dr. Marval Regal Renal transplant - done around 2010, cont prograf, myfortic and pred. Renal US stable.  DM2 - on trulicity, insulin pump at home. Per primary team  HTN - lasix has been on hold - will give lasix today as above   Movement disorder - chronic issue, this is not myoclonus but is his baseline "chorea" per the sister. Getting Austedo per primary    Disposition - continue inpatient monitoring however nearing readiness for discharge from a nephrology standpoint if remains stable     Recent Labs  Lab 03/17/22 0204 03/17/22 1611 03/18/22 1608 03/19/22 0416  HGB 7.8*  --   --  7.9*  ALBUMIN 2.7*   < > 3.0* 2.7*  CALCIUM 7.8*   < > 8.1* 7.8*  PHOS 3.2   < > 3.5 4.2  CREATININE 3.69*   < > 3.70* 3.79*  K 4.2   < > 4.7 4.5   < > = values in this interval not displayed.   No results for input(s): "IRON", "TIBC", "FERRITIN" in the last 168 hours. Inpatient medications:  amLODipine  10 mg Oral Daily   atorvastatin  10 mg Oral Daily   carvedilol  12.5 mg Oral BID   Chlorhexidine Gluconate Cloth  6 each Topical Daily   Deutetrabenazine  6 mg Oral BID   feeding supplement  237 mL Oral BID BM   heparin  5,000 Units Subcutaneous Q8H   hydrALAZINE  50 mg Oral TID   insulin aspart  0-15 Units Subcutaneous TID WC   insulin glargine-yfgn  6 Units Subcutaneous QHS   levothyroxine  112 mcg Oral Q0600   lipase/protease/amylase  72,000 Units Oral TID WC   melatonin  3 mg Oral QHS   multivitamin  1 tablet Oral QHS    mycophenolate  180 mg Oral BID   nystatin   Topical TID   predniSONE  5 mg Oral Q breakfast   QUEtiapine  50 mg Oral QHS   senna  1 tablet Oral BID   tacrolimus  1.5 mg Oral BID     acetaminophen, albuterol, hydrALAZINE, naphazoline-glycerin, ondansetron **OR** ondansetron (ZOFRAN) IV, polyethylene glycol   Claudia Desanctis, MD 4:12 PM 03/19/2022

## 2022-03-19 NOTE — Progress Notes (Signed)
PROGRESS NOTE Nathan Tyler  ESP:[id number] DOB: 03-12-1956 DOA: 03/09/2022 PCP: Celene Squibb, MD   Brief Narrative/Hospital Course: 66 year old male with PMH CKD4 s/p transplant, chorea, DM II, HTN, HLD, hypothyroidism who presented with SOB. He was recently been hospitalized from 11/1 - 11/8 as well for CAP and completed antibiotics in the hospital and was discharged home with home health. Seen in the ER, CXR was more concerning for volume overload and he was found to have worsened renal function.  He was evaluated by nephrology and admitted for acute hypoxic respiratory failure, acute metabolic encephalopathy and started on CRRT for fluid overload and acutely worsening renal failure on the CT of CKD stage IV in a transplant kidney.Patient was transferred from [facility] to [facility] hospital 03/15/22. Followed by nephrology AKI improved back to baseline CK.Mental status improvedIR consulted to remove [facility]  Awaiting for CIR  Subjective: Seen and examined this morning No new complaints Foley in place, leg appears edematous PT OT worked with him today saturation 87% RA and with gait maintained to 91% RA   Assessment and Plan: Principal Problem:   Acute renal failure superimposed on stage 4 chronic kidney disease ([place]) Active Problems:   Acute respiratory failure with hypoxia (HCC)   Acute on chronic anemia   DM2 (diabetes mellitus, type 2) (HCC)   Acute renal failure (HCC)   Malnutrition of moderate degree   AKI on CKD stage IV on transplant kidney: Creatinine peaked to 5.9, seen by nephrology.On CRRT 11/19-11/21.  UA negative.  Urine lites consistent with prerenal AKI, ultrasound normal.  Right IJ placed 11/22 for TDC> mild renal recovery creatinine at 3.7, having urine output, nephrology planning for [facility] removal 11/27, Foley in place ordered per nephrology input for removal.  Also having leg edema unclear if he can tolerate diuretics. Recent Labs  Lab 03/17/22 0204 03/17/22 1611 03/18/22 0212  03/18/22 1608 03/19/22 0416  BUN 63* 59* 63* 63* 67*  CREATININE 3.69* 3.55* 3.62* 3.70* 3.79*    Intake/Output Summary (Last 24 hours) at 03/19/2022 1115 Last data filed at 03/19/2022 0545 Gross per 24 hour  Intake 240 ml  Output 950 ml  Net -710 ml     Acute hypoxic respiratory failure due to pulmonary edema: With significant dyspnea fluid overload patient overall clinically improving on ambulation saturation maintained at 91% room air today.  Lower leg edema 2+ pitting edema, appreciate nephrology input fluid/diuretic management per nephrology, likely hypoalbuminemia also contributing.  Mobilize, augment diet.  Acute metabolic encephalopathy due to sundowning hypoxia uremia renal failure needing Precedex initially.  Mental status alert and oriented x3 and stable.Utilize low-dose Haldol with close monitoring for worsening of chorea    Status post renal transplant 2008 -chronically immunosuppressed on tacrolimus, mycophenolate, and low-dose prednisone.   Anemia of chronic kidney disease: stable.  Monitor as below and transfuse if less than 7 g Recent Labs  Lab 03/14/22 0726 03/15/22 0544 03/16/22 0143 03/17/22 0204 03/19/22 0416  HGB 8.1* 7.5* 7.8* 7.8* 7.9*  HCT 26.9* 25.9* 25.0* 24.4* 26.2*    DM2 with poorly controlled hyperglycemia:patient on prednisone, QJF:HLKT low-dose Semglee 6 U:03/18/22, sugar improving monitor blood labile AKI recovering.A1c 5.7.  At home on 5 units 3 times daily short-acting, Trulicity weekly. Recent Labs  Lab 03/18/22 1132 03/18/22 1619 03/18/22 1755 03/18/22 1953 03/19/22 0813  GLUCAP 209* 159* 170* 189* 138*      Lab Results  Component Value Date   HGBA1C 5.7 (H) 02/22/2022     HTN: BP well  controlled  XTG:GYIRSWNI Lipitor Hypothyroidism: Continue home dose of Synthroid Chorea:Continue home dose of Austedo Class II Obesity:Patient's Body mass index is 36.28 kg/m. : Will benefit with PCP follow-up, weight loss  healthy lifestyle and  outpatient sleep evaluation. Deconditioning/debility continue PT OT recommending CIR Moderate malnutrition augment diet as below Nutrition Problem: Moderate Malnutrition Etiology: chronic illness (renal transplant 2008 with CKD 4 on chonic immunosuppression) Signs/Symptoms: mild muscle depletion, mild fat depletion Interventions: Liberalize Diet, Refer to RD note for recommendations, MVI, Ensure Enlive (each supplement provides 350kcal and 20 grams of protein)  DVT prophylaxis: heparin injection 5,000 Units Start: 03/09/22 2200 Code Status:   Code Status: DNR Family Communication: plan of care discussed with patient  Patient status is: Inpatient because of acute renal failure Level of care: Med-Surg  Dispo: The patient is from: home w/ wife            Anticipated disposition: Medically stable discharge to CIR once bed available, hoping for Foley removal soon, planning for [facility] removal today Likely will have a bed tomorrow at CIR  Objective: Vitals last 24 hrs: Vitals:   03/19/22 0427 03/19/22 0500 03/19/22 0810 03/19/22 1005  BP: (!) 140/66  (!) 147/69   Pulse: 65  63   Resp: 18  19   Temp: 98.2 F (36.8 C)  97.6 F (36.4 C)   TempSrc: Oral  Oral   SpO2: 98%  98% 94%  Weight:  118 kg    Height:       Weight change:   Physical Examination: General exam: AAOX3, weak,older appearing HEENT:Oral mucosa moist, Ear/Nose WNL grossly, dentition normal. Respiratory system: bilaterally CLEAR BS, no use of accessory muscle Cardiovascular system: S1 & S2 +, regular rate, JVD neg. Gastrointestinal system: Abdomen soft, NT,ND,BS+ right IJ  HD catheter + Nervous System:Alert, awake, moving extremities and grossly nonfocal Extremities: LE ankle edema ++, lower extremities warm Skin: No rashes,no icterus. MSK: Normal muscle bulk,tone, power  FOLEY+ Medications reviewed:  Scheduled Meds:  amLODipine  10 mg Oral Daily   atorvastatin  10 mg Oral Daily   carvedilol  12.5 mg Oral BID    Chlorhexidine Gluconate Cloth  6 each Topical Daily   Deutetrabenazine  6 mg Oral BID   feeding supplement  237 mL Oral BID BM   heparin  5,000 Units Subcutaneous Q8H   hydrALAZINE  50 mg Oral TID   insulin aspart  0-15 Units Subcutaneous TID WC   insulin glargine-yfgn  6 Units Subcutaneous QHS   levothyroxine  112 mcg Oral Q0600   lipase/protease/amylase  72,000 Units Oral TID WC   melatonin  3 mg Oral QHS   multivitamin  1 tablet Oral QHS   mycophenolate  180 mg Oral BID   nystatin   Topical TID   predniSONE  5 mg Oral Q breakfast   QUEtiapine  50 mg Oral QHS   senna  1 tablet Oral BID   tacrolimus  1.5 mg Oral BID   Continuous Infusions:    Diet Order             Diet renal/carb modified with fluid restriction Diet-HS Snack? Nothing; Fluid restriction: 1200 mL Fluid; Room service appropriate? Yes; Fluid consistency: Thin  Diet effective now                  Intake/Output Summary (Last 24 hours) at 03/19/2022 1114 Last data filed at 03/19/2022 0545 Gross per 24 hour  Intake 240 ml  Output 950 ml  Net -  710 ml    Net IO Since Admission: -7,304.85 mL [03/19/22 1114]  Wt Readings from Last 3 Encounters:  03/19/22 118 kg  02/22/22 104.3 kg  11/24/21 107 kg     Unresulted Labs (From admission, onward)     Start     Ordered   03/12/22 1600  Renal function panel (daily at 1600)  Daily at 1600,   R (with TIMED occurrences)     Question:  Specimen collection method  Answer:  Lab=Lab collect   03/11/22 1626   03/12/22 0500  Renal function panel (daily at 0500)  Daily at 5am,   R     Question:  Specimen collection method  Answer:  Lab=Lab collect   03/11/22 1626   03/12/22 0500  Magnesium  Daily at 5am,   R     Question:  Specimen collection method  Answer:  Lab=Lab collect   03/11/22 1626          Data Reviewed: I have personally reviewed following labs and imaging studies CBC: Recent Labs  Lab 03/14/22 0726 03/15/22 0544 03/16/22 0143 03/17/22 0204  03/19/22 0416  WBC 5.7 6.0 6.1 6.4 6.0  HGB 8.1* 7.5* 7.8* 7.8* 7.9*  HCT 26.9* 25.9* 25.0* 24.4* 26.2*  MCV 98.5 99.6 96.5 94.9 97.8  PLT 184 185 180 175 102    Basic Metabolic Panel: Recent Labs  Lab 03/15/22 0544 03/15/22 1552 03/16/22 0143 03/16/22 1607 03/17/22 0204 03/17/22 1611 03/18/22 0212 03/18/22 1608 03/19/22 0416  NA 139   < > 135   < > 137 138 137 140 138  K 3.9   < > 4.0   < > 4.2 4.4 4.5 4.7 4.5  CL 109   < > 104   < > 103 105 105 108 107  CO2 26   < > 24   < > '24 25 24 24 25  '$ GLUCOSE 200*   < > 278*   < > 307* 282* 318* 185* 167*  BUN 56*   < > 58*   < > 63* 59* 63* 63* 67*  CREATININE 3.37*   < > 3.77*   < > 3.69* 3.55* 3.62* 3.70* 3.79*  CALCIUM 7.8*   < > 7.6*   < > 7.8* 8.0* 7.6* 8.1* 7.8*  MG 2.3  --  2.5*  --  2.6*  --  2.5*  --  2.6*  PHOS 3.9   < > 3.6   < > 3.2 3.1 3.6 3.5 4.2   < > = values in this interval not displayed.    No results found for this or any previous visit (from the past 240 hour(s)).  Antimicrobials: Anti-infectives (From admission, onward)    Start     Dose/Rate Route Frequency Ordered Stop   03/14/22 1315  ceFAZolin (ANCEF) IVPB 2g/100 mL premix        2 g 200 mL/hr over 30 Minutes Intravenous On call to O.R. 03/14/22 1300 03/14/22 1400   03/11/22 1600  levofloxacin (LEVAQUIN) IVPB 500 mg  Status:  Discontinued        500 mg 100 mL/hr over 60 Minutes Intravenous Every 48 hours 03/09/22 1808 03/11/22 1539   03/09/22 1600  levofloxacin (LEVAQUIN) IVPB 500 mg        500 mg 100 mL/hr over 60 Minutes Intravenous  Once 03/09/22 1551 03/09/22 1730      Culture/Microbiology    Component Value Date/Time   SDES BLOOD LEFT FOREARM 02/21/2022 2236   SPECREQUEST  02/21/2022 2236    BOTTLES DRAWN AEROBIC AND ANAEROBIC Blood Culture adequate volume   CULT  02/21/2022 2236    NO GROWTH 5 DAYS Performed at [facility], [place] [address], [place], [place] [address]    REPTSTATUS 02/26/2022 FINAL 02/21/2022 2236     Radiology Studies: No results found.   LOS: 10 days   Antonieta Pert, MD [facility]  03/19/2022, 11:14 AM

## 2022-03-19 NOTE — Procedures (Signed)
PROCEDURE SUMMARY:  Successful removal of tunneled hemodialysis catheter.  Patient tolerated well.  EBL < 5 mL  See full dictation in Imaging for details.  Armando Gang Dawan Farney PA-C 03/19/2022 11:39 AM

## 2022-03-19 NOTE — H&P (Incomplete)
Physical Medicine and Rehabilitation Admission H&P    Chief Complaint  Patient presents with   Shortness of Breath  : HPI: Nathan Tyler is a 66 year old right-handed male with history of hypertension, hyperlipidemia, PVD with history of right amputation fifth toe as well as left second toe, obesity with BMI 36.28, chronic anemia, hypothyroidism who renal transplantation 2008, chronically compromised maintained on Prograf as well as prednisone, CKD stage IV baseline creatinine 3-4 followed by renal services, diabetes mellitus with insulin pump, movement disorder.  Recently hospitalized 11/1 - 11/8 for community-acquired pneumonia did not show any growth on cultures.  Completed course of antibiotics and discharged home with home health therapies.  Per chart review patient lives with spouse.  Two-level home with bed bath on main level.  Occasional use of a rolling walker prior to admission.  Presented 03/09/2022 with generalized weakness x 2 days as well as shortness of breath.  No fever or dysuria.  In the ED oxygen saturations were low at 80% on room air received nebulizer treatment.  Chest x-ray showed bilateral patchy airspace opacities representing a combination of bronchovascular crowding, pulmonary edema and multifocal infection.  Admission chemistries chloride 112 CO2 21 glucose 152 BUN 58 creatinine 3.28 GFR 20, hemoglobin 7.0, BNP 1590, troponin negative, procalcitonin 0.10.  Renal ultrasound showed no transplant hydronephrosis.  Echocardiogram with ejection fraction of 55 to 60% no wall motion abnormalities grade 2 diastolic dysfunction.  He was started on CRRT for fluid overload acutely worsening renal failure with follow-up per nephrology services with creatinine peak 5.9.  Received CRRT 11/19 - 11/21.  Right IJ placed 11/22 for TDC.  Renal function has continued to improve and no longer requiring HD.  IR was consulted to remove TDC.  Patient with bouts of confusion/sundowning hypoxia due to  uremia initially requiring Precedex and has improved as patient does continue on Seroquel.  Placed on subcutaneous heparin for DVT prophylaxis.  Therapy evaluations completed due to patient decreased functional mobility was admitted for a comprehensive rehab program.  Review of Systems  Constitutional:  Positive for malaise/fatigue. Negative for chills and fever.  HENT:  Negative for hearing loss and tinnitus.   Eyes:  Negative for blurred vision and double vision.  Respiratory:  Positive for shortness of breath. Negative for wheezing.   Cardiovascular:  Positive for leg swelling. Negative for chest pain and palpitations.  Gastrointestinal:  Positive for constipation.  Genitourinary:  Negative for dysuria, flank pain and hematuria.  Musculoskeletal:  Positive for joint pain and myalgias.  Neurological:  Positive for tremors.  All other systems reviewed and are negative.  Past Medical History:  Diagnosis Date   Chorea    Diabetes mellitus without complication ([place])    Essential hypertension    Hypercholesteremia    Hypothyroidism    Neuromuscular disorder ([facility])    PONV (postoperative nausea and vomiting)    Renal failure    Has had kidney transplant 2008   Past Surgical History:  Procedure Laterality Date   AMPUTATION Right 06/27/2016   Procedure: AMPUTATION 5TH TOE RIGHT FOOT;  Surgeon: Caprice Beaver, DPM;  Location: [facility];  Service: Podiatry;  Laterality: Right;   AMPUTATION TOE Left 09/02/2015   Procedure: AMPUTATION  2ND TOE LEFT FOOT;  Surgeon: Aviva Signs, MD;  Location: [facility];  Service: General;  Laterality: Left;   BIOPSY  11/24/2021   Procedure: BIOPSY;  Surgeon: Daneil Dolin, MD;  Location: [facility];  Service: Endoscopy;;   CATARACT EXTRACTION Bilateral  COLONOSCOPY  01/2014   done in [place]. patient reports h/o colon polyps on TCS in 2010, 2015 colonoscopy normal but advised to come back in 2020.    COLONOSCOPY WITH PROPOFOL N/A 01/19/2019   Procedure:  COLONOSCOPY WITH PROPOFOL;  Surgeon: Daneil Dolin, MD;  [facility]: [facility];  Service: Endoscopy;  Laterality: N/A;  9:45am   COLONOSCOPY WITH PROPOFOL N/A 11/24/2021   Procedure: COLONOSCOPY WITH PROPOFOL;  Surgeon: Daneil Dolin, MD;  [facility]: [facility];  Service: Endoscopy;  Laterality: N/A;  2:15pm   IR FLUORO GUIDE CV LINE RIGHT  03/14/2022   IR PERC TUN PERIT CATH WO PORT S&I /IMAG  03/14/2022   IR US GUIDE VASC ACCESS RIGHT  03/14/2022   KIDNEY TRANSPLANT Right 2010   POLYPECTOMY  01/19/2019   Procedure: POLYPECTOMY;  Surgeon: Daneil Dolin, MD;  [facility]: [facility];  Service: Endoscopy;;   POLYPECTOMY  11/24/2021   Procedure: POLYPECTOMY;  Surgeon: Daneil Dolin, MD;  Location: [facility];  Service: Endoscopy;;   Family History  Problem Relation Age of Onset   Colon cancer Maternal Aunt 75       passed from [facility]   Social History:  reports that he has never smoked. He has never been exposed to tobacco smoke. He has never used smokeless tobacco. He reports current alcohol use. He reports that he does not use drugs. Allergies: No Known Allergies Medications Prior to Admission  Medication Sig Dispense Refill   amLODipine (NORVASC) 10 MG tablet Take 10 mg by mouth daily.     atorvastatin (LIPITOR) 10 MG tablet Take 10 mg by mouth daily.     carvedilol (COREG) 12.5 MG tablet Take 12.5 mg by mouth 2 (two) times daily.     Deutetrabenazine (AUSTEDO) 6 MG TABS Take 6 mg by mouth 2 (two) times daily.     furosemide (LASIX) 20 MG tablet Take 20 mg by mouth daily.      hydrALAZINE (APRESOLINE) 50 MG tablet Take 50 mg by mouth 3 (three) times daily.     Insulin Disposable Pump (OMNIPOD DASH PODS, GEN 4,) MISC Inject 5 Units into the skin 3 (three) times daily. Medication: Novolog family member states usually runs about 5 units TID depending on his sliding scale     levothyroxine (SYNTHROID) 112 MCG tablet Take 112 mcg by mouth daily.     lipase/protease/amylase (CREON)  36000 UNITS CPEP capsule Take 2 capsules (72,000 Units total) by mouth 3 (three) times daily with meals. Take 1 with snacks 240 capsule 3   mycophenolate (MYFORTIC) 180 MG EC tablet Take 1 tablet (180 mg total) by mouth 2 (two) times daily.     predniSONE (DELTASONE) 5 MG tablet Take 1 tablet (5 mg total) by mouth daily with breakfast. restart '5mg'$  dose 3/10 after completing prednisone taper (2/24 - 3/9)     sodium bicarbonate 650 MG tablet Take 650 mg by mouth 2 (two) times daily.     tacrolimus (PROGRAF) 0.5 MG capsule Take 1.5 mg by mouth 2 (two) times daily.      Dulaglutide (TRULICITY) 1.5 ZO/1.0RU SOPN Inject 1.5 mg into the skin every 7 (seven) days.         Home: Home Living Family/patient expects to be discharged to:: Private residence Living Arrangements: Spouse/significant other Available Help at Discharge: Family, Available 24 hours/day Type of Home: House Home Access: Stairs to enter [employer] of Steps: 3 Entrance Stairs-Rails: None Home Layout: Two level, Able to  live on main level with bedroom/bathroom, Bed/bath upstairs Alternate Level Stairs-Number of Steps: 13 Alternate Level Stairs-Rails: Right Bathroom Shower/Tub: Multimedia programmer: Standard Bathroom Accessibility: Yes Home Equipment: Conservation officer, nature (2 wheels), Shower seat Additional Comments: Occasional use of RW,  Lives With: Spouse   Functional History: Prior Function Prior Level of Function : Needs assist Physical Assist : Mobility (physical) Mobility (physical): Gait, Stairs ADLs (physical): Grooming, Bathing Mobility Comments: occasional use of RW ADLs Comments: independent in self care, assisted for IADLs  Functional Status:  Mobility: Bed Mobility Overal bed mobility: Needs Assistance Bed Mobility: Supine to Sit Supine to sit: Min guard, HOB elevated Sit to supine: Supervision General bed mobility comments: HOB 20 degree with guarding for safety, use of rail, increased  time Transfers Overall transfer level: Needs assistance Equipment used: Rolling walker (2 wheels) Transfers: Sit to/from Stand Sit to Stand: Min assist Bed to/from chair/wheelchair/BSC transfer type:: Stand pivot Stand pivot transfers: Min assist General transfer comment: min assist for safety to rise from bed. Pt impulsive and jumping up to RW too far away and knocking over O2 tank. 5 repeated sit to stands from recliner with use of armrests and minguard for safety Ambulation/Gait Ambulation/Gait assistance: Min guard Gait Distance (Feet): 350 Feet Assistive device: Rolling walker (2 wheels) Gait Pattern/deviations: Step-through pattern, Decreased stride length General Gait Details: cues for posture and proximity to RW Gait velocity interpretation: 1.31 - 2.62 ft/sec, indicative of limited community ambulator    ADL: ADL Overall ADL's : Needs assistance/impaired Eating/Feeding: Set up, Sitting Grooming: Sitting, Supervision/safety Upper Body Bathing: Minimal assistance, Sitting Lower Body Bathing: Moderate assistance, Sit to/from stand, Sitting/lateral leans, Min guard Lower Body Bathing Details (indicate cue type and reason): min guard from sitting, mod from standing Upper Body Dressing : Minimal assistance, Sitting Lower Body Dressing: Moderate assistance, Sit to/from stand, Sitting/lateral leans, Min guard Lower Body Dressing Details (indicate cue type and reason): min guard to don socks, mod if standing Toilet Transfer: Rolling walker (2 wheels), Minimal assistance Functional mobility during ADLs: Rolling walker (2 wheels), Minimal assistance General ADL Comments: stood EOB and side stepped  Cognition: Cognition Overall Cognitive Status: Impaired/Different from baseline Orientation Level: Oriented X4 Cognition Arousal/Alertness: Awake/alert Behavior During Therapy: Impulsive Overall Cognitive Status: Impaired/Different from baseline Area of Impairment: Safety/judgement,  Memory, Problem solving, Awareness Memory: Decreased short-term memory, Decreased recall of precautions Safety/Judgement: Decreased awareness of safety, Decreased awareness of deficits Awareness: Intellectual Problem Solving: Slow processing General Comments: pt impulsive and jumping up from bed to recliner,knocking over O2 tank, unable to recall room number  Physical Exam: Blood pressure (!) 147/69, pulse 63, temperature 97.6 F (36.4 C), temperature source Oral, resp. rate 19, height '5\' 11"'$  (1.803 m), weight 118 kg, SpO2 94 %. Physical Exam Neurological:     Comments: Patient is awake and makes eye contact with examiner.  Provides name and age.  Follows simple commands.  Limited medical historian.     Results for orders placed or performed during the hospital encounter of 03/09/22 (from the past 48 hour(s))  Renal function panel (daily at 1600)     Status: Abnormal   Collection Time: 03/17/22  4:11 PM  Result Value Ref Range   Sodium 138 135 - 145 mmol/L   Potassium 4.4 3.5 - 5.1 mmol/L   Chloride 105 98 - 111 mmol/L   CO2 25 22 - 32 mmol/L   Glucose, Bld 282 (H) 70 - 99 mg/dL    Comment: Glucose reference range applies only  to samples taken after fasting for at least 8 hours.   BUN 59 (H) 8 - 23 mg/dL   Creatinine, Ser 3.55 (H) 0.61 - 1.24 mg/dL   Calcium 8.0 (L) 8.9 - 10.3 mg/dL   Phosphorus 3.1 2.5 - 4.6 mg/dL   Albumin 2.9 (L) 3.5 - 5.0 g/dL   GFR, Estimated 18 (L) >60 mL/min    Comment: (NOTE) Calculated using the CKD-EPI Creatinine Equation (2021)    Anion gap 8 5 - 15    Comment: Performed at [facility] [address] [place], [place] [address]  Glucose, capillary     Status: Abnormal   Collection Time: 03/17/22  5:18 PM  Result Value Ref Range   Glucose-Capillary 299 (H) 70 - 99 mg/dL    Comment: Glucose reference range applies only to samples taken after fasting for at least 8 hours.  Glucose, capillary     Status: Abnormal   Collection Time: 03/17/22   9:35 PM  Result Value Ref Range   Glucose-Capillary 297 (H) 70 - 99 mg/dL    Comment: Glucose reference range applies only to samples taken after fasting for at least 8 hours.  Renal function panel (daily at 0500)     Status: Abnormal   Collection Time: 03/18/22  2:12 AM  Result Value Ref Range   Sodium 137 135 - 145 mmol/L   Potassium 4.5 3.5 - 5.1 mmol/L   Chloride 105 98 - 111 mmol/L   CO2 24 22 - 32 mmol/L   Glucose, Bld 318 (H) 70 - 99 mg/dL    Comment: Glucose reference range applies only to samples taken after fasting for at least 8 hours.   BUN 63 (H) 8 - 23 mg/dL   Creatinine, Ser 3.62 (H) 0.61 - 1.24 mg/dL   Calcium 7.6 (L) 8.9 - 10.3 mg/dL   Phosphorus 3.6 2.5 - 4.6 mg/dL   Albumin 2.7 (L) 3.5 - 5.0 g/dL   GFR, Estimated 18 (L) >60 mL/min    Comment: (NOTE) Calculated using the CKD-EPI Creatinine Equation (2021)    Anion gap 8 5 - 15    Comment: Performed at [facility] [address] [place], [place] [address]  Magnesium     Status: Abnormal   Collection Time: 03/18/22  2:12 AM  Result Value Ref Range   Magnesium 2.5 (H) 1.7 - 2.4 mg/dL    Comment: Performed at [facility] [address] [place], [place] [address]  Glucose, capillary     Status: Abnormal   Collection Time: 03/18/22  7:27 AM  Result Value Ref Range   Glucose-Capillary 271 (H) 70 - 99 mg/dL    Comment: Glucose reference range applies only to samples taken after fasting for at least 8 hours.  Glucose, capillary     Status: Abnormal   Collection Time: 03/18/22 11:32 AM  Result Value Ref Range   Glucose-Capillary 209 (H) 70 - 99 mg/dL    Comment: Glucose reference range applies only to samples taken after fasting for at least 8 hours.  Renal function panel (daily at 1600)     Status: Abnormal   Collection Time: 03/18/22  4:08 PM  Result Value Ref Range   Sodium 140 135 - 145 mmol/L   Potassium 4.7 3.5 - 5.1 mmol/L   Chloride 108 98 - 111 mmol/L   CO2 24 22 - 32 mmol/L   Glucose,  Bld 185 (H) 70 - 99 mg/dL    Comment: Glucose  reference range applies only to samples taken after fasting for at least 8 hours.   BUN 63 (H) 8 - 23 mg/dL   Creatinine, Ser 3.70 (H) 0.61 - 1.24 mg/dL   Calcium 8.1 (L) 8.9 - 10.3 mg/dL   Phosphorus 3.5 2.5 - 4.6 mg/dL   Albumin 3.0 (L) 3.5 - 5.0 g/dL   GFR, Estimated 17 (L) >60 mL/min    Comment: (NOTE) Calculated using the CKD-EPI Creatinine Equation (2021)    Anion gap 8 5 - 15    Comment: Performed at [facility] [address] [place], [place] [address]  Glucose, capillary     Status: Abnormal   Collection Time: 03/18/22  4:19 PM  Result Value Ref Range   Glucose-Capillary 159 (H) 70 - 99 mg/dL    Comment: Glucose reference range applies only to samples taken after fasting for at least 8 hours.  Glucose, capillary     Status: Abnormal   Collection Time: 03/18/22  5:55 PM  Result Value Ref Range   Glucose-Capillary 170 (H) 70 - 99 mg/dL    Comment: Glucose reference range applies only to samples taken after fasting for at least 8 hours.  Glucose, capillary     Status: Abnormal   Collection Time: 03/18/22  7:53 PM  Result Value Ref Range   Glucose-Capillary 189 (H) 70 - 99 mg/dL    Comment: Glucose reference range applies only to samples taken after fasting for at least 8 hours.  Renal function panel (daily at 0500)     Status: Abnormal   Collection Time: 03/19/22  4:16 AM  Result Value Ref Range   Sodium 138 135 - 145 mmol/L   Potassium 4.5 3.5 - 5.1 mmol/L   Chloride 107 98 - 111 mmol/L   CO2 25 22 - 32 mmol/L   Glucose, Bld 167 (H) 70 - 99 mg/dL    Comment: Glucose reference range applies only to samples taken after fasting for at least 8 hours.   BUN 67 (H) 8 - 23 mg/dL   Creatinine, Ser 3.79 (H) 0.61 - 1.24 mg/dL   Calcium 7.8 (L) 8.9 - 10.3 mg/dL   Phosphorus 4.2 2.5 - 4.6 mg/dL   Albumin 2.7 (L) 3.5 - 5.0 g/dL   GFR, Estimated 17 (L) >60 mL/min    Comment: (NOTE) Calculated using the CKD-EPI Creatinine  Equation (2021)    Anion gap 6 5 - 15    Comment: Performed at [facility] [address], [place], [place] [address]  Magnesium     Status: Abnormal   Collection Time: 03/19/22  4:16 AM  Result Value Ref Range   Magnesium 2.6 (H) 1.7 - 2.4 mg/dL    Comment: Performed at [facility] [address] [place], [place] [address]  CBC     Status: Abnormal   Collection Time: 03/19/22  4:16 AM  Result Value Ref Range   WBC 6.0 4.0 - 10.5 K/uL   RBC 2.68 (L) 4.22 - 5.81 MIL/uL   Hemoglobin 7.9 (L) 13.0 - 17.0 g/dL   HCT 26.2 (L) 39.0 - 52.0 %   MCV 97.8 80.0 - 100.0 fL   MCH 29.5 26.0 - 34.0 pg   MCHC 30.2 30.0 - 36.0 g/dL   RDW 14.6 11.5 - 15.5 %   Platelets 182 150 - 400 K/uL   nRBC 0.0 0.0 - 0.2 %    Comment: Performed at [facility], [place],  [place] [address]  Glucose, capillary     Status: Abnormal   Collection Time: 03/19/22  8:13 AM  Result Value Ref Range   Glucose-Capillary 138 (H) 70 - 99 mg/dL    Comment: Glucose reference range applies only to samples taken after fasting for at least 8 hours.   No results found.    Blood pressure (!) 147/69, pulse 63, temperature 97.6 F (36.4 C), temperature source Oral, resp. rate 19, height '5\' 11"'$  (1.803 m), weight 118 kg, SpO2 94 %.  Medical Problem List and Plan: 1. Functional deficits secondary to debility/metabolic encephalopathy/acute respiratory failure/AKI on CKD  -patient may *** shower  -ELOS/Goals: *** 2.  Antithrombotics: -DVT/anticoagulation:  Pharmaceutical: Heparin  -antiplatelet therapy: N/A 3. Pain Management: Tylenol as needed 4. Mood/Behavior/Sleep: Melatonin 3 mg nightly  -antipsychotic agents: Seroquel 50 mg nightly 5. Neuropsych/cognition: This patient is capable of making decisions on his own behalf. 6. Skin/Wound Care: Routine skin checks 7. Fluids/Electrolytes/Nutrition: Routine in and outs with follow-up chemistries 8.  AKI on CKD stage IV with history of renal  transplant 2008.  CRRT 11/19 - 11/21.  No longer in need of HD.  Follow-up renal services 9.  Renal transplant/chronically immunocompromised.  Continue prednisone as well as Prograf 10.  Anemia of chronic disease.  Stable.  Follow-up CBC 11.  Diabetes mellitus.  Hemoglobin A1c 5.7.  Semglee 6 units daily.  Check blood sugars before meals and at bedtime 12.  Hypothyroidism.  Synthroid 13.  Hypertension.  Hydralazine 50 mg 3 times daily, Coreg 12.5 mg twice daily, Norvasc 10 mg daily.  Monitor with increased mobility 14.  Hyperlipidemia.  Lipitor 15.  Movement disorder.  Continue deutetrabenazine 6 mg twice daily 16.  Obesity.  BMI 36.28.  Dietary follow-up 17.  PVD.  Patient with history of multiple toe amputations.  Maintain skin integrity  Cathlyn Parsons, PA-C 03/19/2022

## 2022-03-19 NOTE — Progress Notes (Signed)
Physical Therapy Treatment Patient Details Name: Nathan Tyler MRN: [id number] DOB: 11-Mar-1956 Today's Date: 03/19/2022   History of Present Illness 66 y.o. male admitted to [facility] 11/17 with dyspnea, weakness, respiratory failure and acute worsening renal failure. CRRT 11/19-11/21. Transferred to [facility] 11/23.  PMhx: renal transplant with CKD, T2DM, HTN, hypothyroidism,  movement disorder, recent admission 11/1-11/8/23 for CAP    PT Comments    Pt pleasant, impulsive with initial standing and demonstrates decreased memory and awareness of deficits. PT reports wife works and cannot be with him at home during the day. Pt progressing toward PLOF but needs assist for safety and balance with brief AIR stay appropriate prior to returning home. Pt educated for strength, balance and cognition deficits with need for assist for mobility. Will continue to follow.   Pt with drop in sats to 87% on RA, 1L with gait maintained 91%    Recommendations for follow up therapy are one component of a multi-disciplinary discharge planning process, led by the attending physician.  Recommendations may be updated based on patient status, additional functional criteria and insurance authorization.  Follow Up Recommendations  Acute inpatient rehab (3hours/day) Can patient physically be transported by private vehicle: Yes   Assistance Recommended at Discharge Frequent or constant Supervision/Assistance  Patient can return home with the following A little help with walking and/or transfers;A little help with bathing/dressing/bathroom;Direct supervision/assist for financial management;Direct supervision/assist for medications management;Assist for transportation   Equipment Recommendations  None recommended by PT    Recommendations for Other Services       Precautions / Restrictions Precautions Precautions: Fall Precaution Comments: 1L to maintain sats with activity     Mobility  Bed Mobility Overal bed mobility:  Needs Assistance Bed Mobility: Supine to Sit     Supine to sit: Min guard, HOB elevated     General bed mobility comments: HOB 20 degree with guarding for safety, use of rail, increased time    Transfers Overall transfer level: Needs assistance   Transfers: Sit to/from Stand Sit to Stand: Min assist           General transfer comment: min assist for safety to rise from bed. Pt impulsive and jumping up to RW too far away and knocking over O2 tank. 5 repeated sit to stands from recliner with use of armrests and minguard for safety    Ambulation/Gait Ambulation/Gait assistance: Min guard Gait Distance (Feet): 350 Feet Assistive device: Rolling walker (2 wheels) Gait Pattern/deviations: Step-through pattern, Decreased stride length   Gait velocity interpretation: 1.31 - 2.62 ft/sec, indicative of limited community ambulator   General Gait Details: cues for posture and proximity to [employer]    Modified Rankin (Stroke Patients Only)       Balance Overall balance assessment: Needs assistance Sitting-balance support: No upper extremity supported, Feet supported Sitting balance-Leahy Scale: Good     Standing balance support: Bilateral upper extremity supported, During functional activity Standing balance-Leahy Scale: Poor Standing balance comment: Rw for gait                            Cognition Arousal/Alertness: Awake/alert Behavior During Therapy: Impulsive Overall Cognitive Status: Impaired/Different from baseline Area of Impairment: Safety/judgement, Memory, Problem solving, Awareness                     Memory: Decreased short-term memory,  Decreased recall of precautions   Safety/Judgement: Decreased awareness of safety, Decreased awareness of deficits   Problem Solving: Slow processing General Comments: pt impulsive and jumping up from bed to recliner,knocking over O2 tank, unable to recall  room number        Exercises General Exercises - Lower Extremity Long Arc Quad: Strengthening, 15 reps, AROM, Both, Seated Hip Flexion/Marching: AROM, Both, 20 reps, Seated    General Comments        Pertinent Vitals/Pain Pain Assessment Pain Assessment: No/denies pain    Home Living                          Prior Function            PT Goals (current goals can now be found in the care plan section) Progress towards PT goals: Progressing toward goals    Frequency    Min 3X/week      PT Plan Current plan remains appropriate    Co-evaluation              AM-PAC PT "6 Clicks" Mobility   Outcome Measure  Help needed turning from your back to your side while in a flat bed without using bedrails?: None Help needed moving from lying on your back to sitting on the side of a flat bed without using bedrails?: A Little Help needed moving to and from a bed to a chair (including a wheelchair)?: A Little Help needed standing up from a chair using your arms (e.g., wheelchair or bedside chair)?: A Little Help needed to walk in hospital room?: A Little Help needed climbing 3-5 steps with a railing? : A Lot 6 Click Score: 18    End of Session Equipment Utilized During Treatment: Gait belt;Oxygen Activity Tolerance: Patient tolerated treatment well Patient left: with call bell/phone within reach;in chair;with chair alarm set Nurse Communication: Mobility status PT Visit Diagnosis: Unsteadiness on feet (R26.81);Muscle weakness (generalized) (M62.81)     Time: 5929-2446 PT Time Calculation (min) (ACUTE ONLY): 25 min  Charges:  $Gait Training: 8-22 mins $Therapeutic Exercise: 8-22 mins                     Bayard Males, PT Acute Rehabilitation Services Office: [place] 03/19/2022, 10:09 AM

## 2022-03-19 NOTE — Care Management Important Message (Signed)
Important Message  Patient Details  Name: Nathan Tyler MRN: [id number] Date of Birth: 06-15-1955   Medicare Important Message Given:  Yes     Orbie Pyo 03/19/2022, 3:06 PM

## 2022-03-20 ENCOUNTER — Encounter (HOSPITAL_COMMUNITY): Payer: Medicare Other

## 2022-03-20 DIAGNOSIS — N184 Chronic kidney disease, stage 4 (severe): Secondary | ICD-10-CM | POA: Diagnosis not present

## 2022-03-20 DIAGNOSIS — N179 Acute kidney failure, unspecified: Secondary | ICD-10-CM | POA: Diagnosis not present

## 2022-03-20 LAB — RENAL FUNCTION PANEL
Albumin: 2.7 g/dL — ABNORMAL LOW (ref 3.5–5.0)
Albumin: 3.1 g/dL — ABNORMAL LOW (ref 3.5–5.0)
Anion gap: 7 (ref 5–15)
Anion gap: 9 (ref 5–15)
BUN: 77 mg/dL — ABNORMAL HIGH (ref 8–23)
BUN: 78 mg/dL — ABNORMAL HIGH (ref 8–23)
CO2: 24 mmol/L (ref 22–32)
CO2: 24 mmol/L (ref 22–32)
Calcium: 7.7 mg/dL — ABNORMAL LOW (ref 8.9–10.3)
Calcium: 8.3 mg/dL — ABNORMAL LOW (ref 8.9–10.3)
Chloride: 103 mmol/L (ref 98–111)
Chloride: 105 mmol/L (ref 98–111)
Creatinine, Ser: 4.26 mg/dL — ABNORMAL HIGH (ref 0.61–1.24)
Creatinine, Ser: 4.32 mg/dL — ABNORMAL HIGH (ref 0.61–1.24)
GFR, Estimated: 14 mL/min — ABNORMAL LOW (ref >60)
GFR, Estimated: 14 mL/min — ABNORMAL LOW (ref >60)
Glucose, Bld: 313 mg/dL — ABNORMAL HIGH (ref 70–99)
Glucose, Bld: 151 mg/dL — ABNORMAL HIGH (ref 70–99)
Phosphorus: 4.6 mg/dL (ref 2.5–4.6)
Phosphorus: 4.3 mg/dL (ref 2.5–4.6)
Potassium: 4.9 mmol/L (ref 3.5–5.1)
Potassium: 5.1 mmol/L (ref 3.5–5.1)
Sodium: 134 mmol/L — ABNORMAL LOW (ref 135–145)
Sodium: 138 mmol/L (ref 135–145)

## 2022-03-20 LAB — GLUCOSE, CAPILLARY
Glucose-Capillary: 262 mg/dL — ABNORMAL HIGH (ref 70–99)
Glucose-Capillary: 188 mg/dL — ABNORMAL HIGH (ref 70–99)
Glucose-Capillary: 139 mg/dL — ABNORMAL HIGH (ref 70–99)
Glucose-Capillary: 228 mg/dL — ABNORMAL HIGH (ref 70–99)

## 2022-03-20 LAB — MAGNESIUM: Magnesium: 2.6 mg/dL — ABNORMAL HIGH (ref 1.7–2.4)

## 2022-03-20 MED ORDER — INSULIN GLARGINE-YFGN 100 UNIT/ML ~~LOC~~ SOLN
10.0000 [IU] | Freq: Every day | SUBCUTANEOUS | Status: DC
  Administered 2022-03-20 – 2022-03-22 (×3): 10 [IU] via SUBCUTANEOUS
  Filled 2022-03-20 (×4): qty 0.1

## 2022-03-20 MED ORDER — GLUCERNA SHAKE PO LIQD
237.0000 mL | Freq: Two times a day (BID) | ORAL | Status: DC
  Administered 2022-03-20 – 2022-03-27 (×12): 237 mL via ORAL
  Filled 2022-03-20 (×2): qty 237

## 2022-03-20 MED ORDER — INSULIN ASPART 100 UNIT/ML IJ SOLN
0.0000 [IU] | Freq: Three times a day (TID) | INTRAMUSCULAR | Status: DC
  Administered 2022-03-20: 2 [IU] via SUBCUTANEOUS
  Administered 2022-03-20: 1 [IU] via SUBCUTANEOUS
  Administered 2022-03-21: 2 [IU] via SUBCUTANEOUS
  Administered 2022-03-21: 3 [IU] via SUBCUTANEOUS
  Administered 2022-03-22: 5 [IU] via SUBCUTANEOUS
  Administered 2022-03-22: 1 [IU] via SUBCUTANEOUS
  Administered 2022-03-22: 2 [IU] via SUBCUTANEOUS
  Administered 2022-03-23: 3 [IU] via SUBCUTANEOUS
  Administered 2022-03-23: 2 [IU] via SUBCUTANEOUS
  Administered 2022-03-24 – 2022-03-27 (×6): 1 [IU] via SUBCUTANEOUS

## 2022-03-20 MED ORDER — FUROSEMIDE 10 MG/ML IJ SOLN
80.0000 mg | Freq: Once | INTRAMUSCULAR | Status: AC
  Administered 2022-03-20: 80 mg via INTRAVENOUS
  Filled 2022-03-20: qty 8

## 2022-03-20 MED ORDER — METOLAZONE 5 MG PO TABS
5.0000 mg | ORAL_TABLET | Freq: Once | ORAL | Status: AC
  Administered 2022-03-20: 5 mg via ORAL
  Filled 2022-03-20: qty 1

## 2022-03-20 MED ORDER — INSULIN ASPART 100 UNIT/ML IJ SOLN
5.0000 [IU] | Freq: Three times a day (TID) | INTRAMUSCULAR | Status: DC
  Administered 2022-03-20 – 2022-03-27 (×21): 5 [IU] via SUBCUTANEOUS

## 2022-03-20 NOTE — Progress Notes (Signed)
[facility] Progress Note  Subjective:  He had 1 liter UOP over 11/27 as well as one unmeasured void.  Foley catheter was removed yesterday with post void residual bladder scan charted at 47 mL.   Review of systems:   He reports some shortness of breath even at rest; he is short of breath with exertion; he denies chest pain  Denies n/v  Vitals:   03/19/22 2218 03/20/22 0500 03/20/22 0534 03/20/22 0833  BP: (!) 146/68  (!) 140/65 (!) 150/70  Pulse: 66  (!) 58 60  Resp:   17 15  Temp:   98.2 F (36.8 C) 98 F (36.7 C)  TempSrc:    Oral  SpO2: 94%  94% 97%  Weight:  113.9 kg    Height:        Physical Exam:   General adult male in bed in no acute distress HEENT normocephalic atraumatic extraocular movements intact sclera anicteric Neck supple trachea midline Lungs clear to auscultation bilaterally normal work of breathing at rest  Heart S1S2 no rub Abdomen soft nontender nondistended Extremities 2-3+ edema lower extremities Psych normal mood and affect Neuro - alert and oriented  GU -  no foley Access - confirmed RIJ tunn catheter has been removed       Date                           Creat               eGFR   2017-2018                 1.11- 1.56   2020                          1.88   2021                          1.54- 2.09   Sept 2023                  3.56                 18 ml/min    11/1- 02/28/22           4.38 >> 3.28    AKI on CKD4, 14 >> 20 ml/min    11/17                                    5.90                 10     11/18                                    5.78                 10       UA negative    UNa <10, UCr 151    CXR - IMPRESSION: Very low lung volumes. Bilateral patchy airspace opacities may represent a combination of bronchovascular crowding, pulmonary edema, and possible multifocal infection.       Assessment/ Plan: AKI on CKD 4 transplant - Started CRRT 03/11/22 - 11/21:  Creat here 5.9 on admission 11/17 in the setting of  SOB  and recent admission for PNA rx'd w/ abx. Likely ATN in setting of advanced CKD.  UA neg, urine lytes c/w pre-renal.  11/20 Tplt renal US normal/ no hydro.  IR consulted for [facility], R IJ placed 11/22.  CRRT stopped on 11/21 Metolazone 5 mg PO once today  Lasix 80 mg IV once today  CKD stage IV of renal allograft - baseline Cr 3.3 - 3.6 (eGFR 18-20) per nephrology charting.  Follows with Dr. Marval Regal Renal transplant - done around 2010, cont prograf, myfortic and pred. Renal US stable.  DM2 - Per primary team  HTN - acceptable on current regimen  Movement disorder - chronic issue, this is not myoclonus but is his baseline "chorea" per the sister. Getting Austedo per primary    Disposition - continue inpatient monitoring; would hold off on a transfer to CIR today with his increase in creatinine and continued overload    Recent Labs  Lab 03/17/22 0204 03/17/22 1611 03/19/22 0416 03/19/22 1557 03/20/22 0413  HGB 7.8*  --  7.9*  --   --   ALBUMIN 2.7*   < > 2.7* 3.0* 2.7*  CALCIUM 7.8*   < > 7.8* 8.2* 7.7*  PHOS 3.2   < > 4.2 4.5 4.6  CREATININE 3.69*   < > 3.79* 4.09* 4.26*  K 4.2   < > 4.5 5.4* 4.9   < > = values in this interval not displayed.   No results for input(s): "IRON", "TIBC", "FERRITIN" in the last 168 hours. Inpatient medications:  amLODipine  10 mg Oral Daily   atorvastatin  10 mg Oral Daily   carvedilol  12.5 mg Oral BID   Deutetrabenazine  6 mg Oral BID   feeding supplement (GLUCERNA SHAKE)  237 mL Oral BID BM   heparin  5,000 Units Subcutaneous Q8H   hydrALAZINE  50 mg Oral TID   insulin aspart  0-9 Units Subcutaneous TID WC   insulin aspart  5 Units Subcutaneous TID WC   insulin glargine-yfgn  10 Units Subcutaneous QHS   levothyroxine  112 mcg Oral Q0600   lipase/protease/amylase  72,000 Units Oral TID WC   melatonin  3 mg Oral QHS   multivitamin  1 tablet Oral QHS   mycophenolate  180 mg Oral BID   nystatin   Topical TID   predniSONE  5 mg Oral Q breakfast    QUEtiapine  50 mg Oral QHS   senna  1 tablet Oral BID   tacrolimus  1.5 mg Oral BID     acetaminophen, albuterol, hydrALAZINE, naphazoline-glycerin, ondansetron **OR** ondansetron (ZOFRAN) IV, polyethylene glycol   Claudia Desanctis, MD 3:22 PM 03/20/2022

## 2022-03-20 NOTE — Progress Notes (Signed)
Initial Nutrition Assessment  DOCUMENTATION CODES:   Non-severe (moderate) malnutrition in context of chronic illness  INTERVENTION:  - Modify supplements to Glucerna Shake po BID, each supplement provides 220 kcal and 10 grams of protein  NUTRITION DIAGNOSIS:   Moderate Malnutrition related to chronic illness (renal transplant 2008 with CKD 4 on chonic immunosuppression) as evidenced by mild muscle depletion, mild fat depletion.  GOAL:   Patient will meet greater than or equal to 90% of their needs  MONITOR:   Weight trends, Supplement acceptance, PO intake, Labs  REASON FOR ASSESSMENT:   Malnutrition Screening Tool    ASSESSMENT:   66 y.o. male with PMH of renal transplantation 2008, chronically immunosuppressed, CKD 4, DM 2 on insulin pump, HTN, HLD, hypothyroidism, movement disorder  Recently hospitalized 11/1-11/8 for community-acquired pneumonia who presented with SOB.  Meds include:  sliding scale insulin, semglee (10 units), MVI, senokot.   Pt ate 100% of his breakfast this am. Pt has been eating well over the past week (PO 40-100%). Blood sugars are very elevated. Pt is currently on Prednisone. RD will modify supplements to Glucerna BID for now to see if this helps. RD will continue to monitor PO intakes.   Diet Order:   Diet Order             Diet renal/carb modified with fluid restriction Diet-HS Snack? Nothing; Fluid restriction: 1200 mL Fluid; Room service appropriate? Yes; Fluid consistency: Thin  Diet effective now                   EDUCATION NEEDS:   No education needs have been identified at this time  Skin:  Skin Assessment: Reviewed RN Assessment  Last BM:  03/19/22  Height:   Ht Readings from Last 1 Encounters:  03/15/22 '5\' 11"'$  (1.803 m)    Weight:   Wt Readings from Last 1 Encounters:  03/20/22 113.9 kg    Ideal Body Weight:     BMI:  Body mass index is 35.02 kg/m.  Estimated Nutritional Needs:   Kcal:  2600-2900  kcal  Protein:  145-165 grams  Fluid:  >/=2.6L  Gracin Mcpartland Graciela Husbands, RD, LDN, CNSC.

## 2022-03-20 NOTE — Progress Notes (Signed)
PROGRESS NOTE Nathan Tyler  QIH:[id number] DOB: 07-22-1955 DOA: 03/09/2022 PCP: Celene Squibb, MD   Brief Narrative/Hospital Course: 66 year old male with PMH CKD4 s/p transplant, chorea, DM II, HTN, HLD, hypothyroidism who presented with SOB. He was recently been hospitalized from 11/1 - 11/8 as well for CAP and completed antibiotics in the hospital and was discharged home with home health. Seen in the ER, CXR was more concerning for volume overload and he was found to have worsened renal function.  He was evaluated by nephrology and admitted for acute hypoxic respiratory failure, acute metabolic encephalopathy and started on CRRT for fluid overload and acutely worsening renal failure on the CT of CKD stage IV in a transplant kidney.Patient was transferred from [facility] to [facility] hospital 03/15/22. Followed by nephrology AKI improved back to baseline CK.Mental status improved> IR removed [facility] 11/27, also given lasix iv for leg edema. Awaiting placement to CIR once creat better.   Subjective: Seen and examined.   On the bedside chair  Foley is out has voided On RA this am No new complaints Assessment and Plan: Principal Problem:   Acute renal failure superimposed on stage 4 chronic kidney disease (HCC) Active Problems:   Acute respiratory failure with hypoxia (HCC)   Acute on chronic anemia   DM2 (diabetes mellitus, type 2) (HCC)   Acute renal failure (HCC)   Malnutrition of moderate degree   AKI on CKD stage IV on transplant kidney: Creatinine peaked to 5.9, seen by nephrology.On CRRT 11/19-11/21.  UA negative.  Urine lites consistent with prerenal AKI, ultrasound normal.  Right TDC removed 11/27, Foley removed 11/27> voiding.overnight slight bump in creatinine, will need to monitor inpatient per nephrology.  Monitor intake output, BMP. Recent Labs  Lab 03/18/22 0212 03/18/22 1608 03/19/22 0416 03/19/22 1557 03/20/22 0413  BUN 63* 63* 67* 67* 77*  CREATININE 3.62* 3.70* 3.79* 4.09* 4.26*     Intake/Output Summary (Last 24 hours) at 03/20/2022 1038 Last data filed at 03/20/2022 0846 Gross per 24 hour  Intake 1080 ml  Output 950 ml  Net 130 ml   Acute hypoxic respiratory failure due to pulmonary edema: Currently doing well on room air.    Lower leg edema 2+ pitting edema, appreciate nephrology input> s/p lasix iv 11/27.  Encourage PT OT and mobilize Net IO Since Admission: -7,174.85 mL [03/20/22 8756]   Acute metabolic encephalopathy due to sundowning hypoxia uremia renal failure needing Precedex initially.  Currently alert awake oriented conversant.    Status post renal transplant 2008 -chronically immunosuppressed on tacrolimus, mycophenolate, and low-dose prednisone.  Monitor renal function   Anemia of chronic kidney disease: stable.  Monitor as below and transfuse if less than 7 g Recent Labs  Lab 03/14/22 0726 03/15/22 0544 03/16/22 0143 03/17/22 0204 03/19/22 0416  HGB 8.1* 7.5* 7.8* 7.8* 7.9*  HCT 26.9* 25.9* 25.0* 24.4* 26.2*     DM2 with poorly controlled hyperglycemia:patient on prednisone.  Increase Semglee > 10 u, added 5 units Premeal insulin, continue SSI. HbA1c 5.7.At home on 5 units 3 times daily short-acting, Trulicity weekly. Recent Labs  Lab 03/19/22 0813 03/19/22 1152 03/19/22 1509 03/19/22 2125 03/20/22 0836  GLUCAP 138* 205* 270* 313* 262*   HTN: BP well controlled on amlodipine 10 Coreg 12.5, hydralazine '50mg'$  tid EPP:IRJJOACZ Lipitor Hypothyroidism: Continue home dose of Synthroid Chorea:Continue home dose of Austedo  Class II Obesity:Patient's Body mass index is 35.02 kg/m. : Will benefit with PCP follow-up, weight loss  healthy lifestyle and outpatient sleep evaluation.  Deconditioning/debility continue PT OT recommending CIR> plan for CIR once renal function is stable/involuntary movement  Moderate malnutrition augment diet as below Nutrition Problem: Moderate Malnutrition Etiology: chronic illness (renal transplant 2008 with  CKD 4 on chonic immunosuppression) Signs/Symptoms: mild muscle depletion, mild fat depletion Interventions: Liberalize Diet, Refer to RD note for recommendations, MVI, Ensure Enlive (each supplement provides 350kcal and 20 grams of protein)  DVT prophylaxis: heparin injection 5,000 Units Start: 03/09/22 2200 Code Status:   Code Status: DNR Family Communication: plan of care discussed with patient  Patient status is: Inpatient because of acute renal failure Level of care: Med-Surg  Dispo: The patient is from: home w/ wife            Anticipated disposition: CIR once renal function is stable, monitoring at least 24 hours inpatient   Objective: Vitals last 24 hrs: Vitals:   03/19/22 2218 03/20/22 0500 03/20/22 0534 03/20/22 0833  BP: (!) 146/68  (!) 140/65 (!) 150/70  Pulse: 66  (!) 58 60  Resp:   17 15  Temp:   98.2 F (36.8 C) 98 F (36.7 C)  TempSrc:    Oral  SpO2: 94%  94% 97%  Weight:  113.9 kg    Height:       Weight change: -4.1 kg  Physical Examination: General exam: AAOX3, weak,older appearing HEENT:Oral mucosa moist, Ear/Nose WNL grossly, dentition normal. Respiratory system: bilaterally CLEAR BS,no use of accessory muscle Cardiovascular system: S1 & S2 +, regular rate, JVD neg. Gastrointestinal system: Abdomen soft,NT,ND,BS+ Nervous System:Alert, awake, moving extremities and grossly nonfocal Extremities: LE ankle edema +, lower extremities warm Skin: No rashes,no icterus. MSK: Normal muscle bulk,tone, power.   Medications reviewed:  Scheduled Meds:  amLODipine  10 mg Oral Daily   atorvastatin  10 mg Oral Daily   carvedilol  12.5 mg Oral BID   Deutetrabenazine  6 mg Oral BID   feeding supplement  237 mL Oral BID BM   heparin  5,000 Units Subcutaneous Q8H   hydrALAZINE  50 mg Oral TID   insulin aspart  0-9 Units Subcutaneous TID WC   insulin aspart  5 Units Subcutaneous TID WC   insulin glargine-yfgn  10 Units Subcutaneous QHS   levothyroxine  112 mcg Oral  Q0600   lipase/protease/amylase  72,000 Units Oral TID WC   melatonin  3 mg Oral QHS   multivitamin  1 tablet Oral QHS   mycophenolate  180 mg Oral BID   nystatin   Topical TID   predniSONE  5 mg Oral Q breakfast   QUEtiapine  50 mg Oral QHS   senna  1 tablet Oral BID   tacrolimus  1.5 mg Oral BID   Continuous Infusions:    Diet Order             Diet renal/carb modified with fluid restriction Diet-HS Snack? Nothing; Fluid restriction: 1200 mL Fluid; Room service appropriate? Yes; Fluid consistency: Thin  Diet effective now                  Intake/Output Summary (Last 24 hours) at 03/20/2022 1038 Last data filed at 03/20/2022 0846 Gross per 24 hour  Intake 1080 ml  Output 950 ml  Net 130 ml    Net IO Since Admission: -7,174.85 mL [03/20/22 1038]  Wt Readings from Last 3 Encounters:  03/20/22 113.9 kg  02/22/22 104.3 kg  11/24/21 107 kg     Unresulted Labs (From admission, onward)     Start  Ordered   03/12/22 1600  Renal function panel (daily at 1600)  Daily at 1600,   R (with TIMED occurrences)     Question:  Specimen collection method  Answer:  Lab=Lab collect   03/11/22 1626   03/12/22 0500  Renal function panel (daily at 0500)  Daily at 5am,   R     Question:  Specimen collection method  Answer:  Lab=Lab collect   03/11/22 1626   03/12/22 0500  Magnesium  Daily at 5am,   R     Question:  Specimen collection method  Answer:  Lab=Lab collect   03/11/22 1626   Signed and Held  CBC  (heparin)  Once,   R       Comments: Baseline for heparin therapy IF NOT ALREADY DRAWN.  Notify MD if PLT < 100 K.   Question:  Specimen collection method  Answer:  Lab=Lab collect   Signed and Held   Signed and Held  Creatinine, serum  (heparin)  Once,   R       Comments: Baseline for heparin therapy IF NOT ALREADY DRAWN.   Question:  Specimen collection method  Answer:  Lab=Lab collect   Signed and Held   Signed and Held  Comprehensive metabolic panel  Tomorrow morning,    R       Question:  Specimen collection method  Answer:  Lab=Lab collect   Signed and Held   Signed and Held  CBC with Differential/Platelet  Tomorrow morning,   R       Question:  Specimen collection method  Answer:  Lab=Lab collect   Signed and Held          Data Reviewed: I have personally reviewed following labs and imaging studies CBC: Recent Labs  Lab 03/14/22 0726 03/15/22 0544 03/16/22 0143 03/17/22 0204 03/19/22 0416  WBC 5.7 6.0 6.1 6.4 6.0  HGB 8.1* 7.5* 7.8* 7.8* 7.9*  HCT 26.9* 25.9* 25.0* 24.4* 26.2*  MCV 98.5 99.6 96.5 94.9 97.8  PLT 184 185 180 175 017    Basic Metabolic Panel: Recent Labs  Lab 03/16/22 0143 03/16/22 1607 03/17/22 0204 03/17/22 1611 03/18/22 0212 03/18/22 1608 03/19/22 0416 03/19/22 1557 03/20/22 0413  NA 135   < > 137   < > 137 140 138 137 134*  K 4.0   < > 4.2   < > 4.5 4.7 4.5 5.4* 4.9  CL 104   < > 103   < > 105 108 107 107 103  CO2 24   < > 24   < > '24 24 25 22 24  '$ GLUCOSE 278*   < > 307*   < > 318* 185* 167* 290* 313*  BUN 58*   < > 63*   < > 63* 63* 67* 67* 77*  CREATININE 3.77*   < > 3.69*   < > 3.62* 3.70* 3.79* 4.09* 4.26*  CALCIUM 7.6*   < > 7.8*   < > 7.6* 8.1* 7.8* 8.2* 7.7*  MG 2.5*  --  2.6*  --  2.5*  --  2.6*  --  2.6*  PHOS 3.6   < > 3.2   < > 3.6 3.5 4.2 4.5 4.6   < > = values in this interval not displayed.    No results found for this or any previous visit (from the past 240 hour(s)).  Antimicrobials: Anti-infectives (From admission, onward)    Start     Dose/Rate Route Frequency Ordered Stop  03/14/22 1315  ceFAZolin (ANCEF) IVPB 2g/100 mL premix        2 g 200 mL/hr over 30 Minutes Intravenous On call to O.R. 03/14/22 1300 03/14/22 1400   03/11/22 1600  levofloxacin (LEVAQUIN) IVPB 500 mg  Status:  Discontinued        500 mg 100 mL/hr over 60 Minutes Intravenous Every 48 hours 03/09/22 1808 03/11/22 1539   03/09/22 1600  levofloxacin (LEVAQUIN) IVPB 500 mg        500 mg 100 mL/hr over 60 Minutes  Intravenous  Once 03/09/22 1551 03/09/22 1730      Culture/Microbiology    Component Value Date/Time   SDES BLOOD LEFT FOREARM 02/21/2022 2236   SPECREQUEST  02/21/2022 2236    BOTTLES DRAWN AEROBIC AND ANAEROBIC Blood Culture adequate volume   CULT  02/21/2022 2236    NO GROWTH 5 DAYS Performed at [facility] [address] [place], [place] [address]    REPTSTATUS 02/26/2022 FINAL 02/21/2022 2236    Radiology Studies: IR Removal Tun Cv Cath W/O FL  Result Date: 03/19/2022 INDICATION: Patient with AKI on CKD status post tunneled hemodialysis catheter placement 03/14/2022, who presents with improved renal function, no longer requires hemodialysis. Request for tunneled hemodialysis catheter removal. EXAM: REMOVAL OF TUNNELED HEMODIALYSIS CATHETER MEDICATIONS: None COMPLICATIONS: None immediate. PROCEDURE: Informed written consent was obtained from the patient following an explanation of the procedure, risks, benefits and alternatives to treatment. A time out was performed prior to the initiation of the procedure. Sterile technique was utilized including mask, sterile gloves, sterile drape, and hand hygiene. ChloraPrep was used to prep the patient's right neck, chest and existing catheter. The catheter was removed intact by applying manual retraction. Hemostasis was obtained with manual compression. A dressing was placed. The patient tolerated the procedure well without immediate post procedural complication. IMPRESSION: Successful removal of a RIGHT chest tunneled dialysis catheter. Read by: Durenda Guthrie, PA-C Electronically Signed   By: Michaelle Birks M.D.   On: 03/19/2022 15:12     LOS: 11 days   Antonieta Pert, MD [facility]  03/20/2022, 10:38 AM

## 2022-03-20 NOTE — Progress Notes (Signed)
Occupational Therapy Treatment Patient Details Name: Nathan Tyler MRN: [id number] DOB: 08-25-1955 Today's Date: 03/20/2022   History of present illness 66 y.o. male admitted to [facility] 11/17 with dyspnea, weakness, respiratory failure and acute worsening renal failure. CRRT 11/19-11/21. Transferred to [facility] 11/23.  PMhx: renal transplant with CKD, T2DM, HTN, hypothyroidism,  movement disorder, recent admission 11/1-11/8/23 for CAP   OT comments  Pt making steady progress towards OT goals this session. Pt continues to present with decreased activity tolerance,generalized deconditioning and mild impulsivity . Session focus on BADL reeducation, increasing overall activity tolerance and decreasing caregiver burden. Pt currently requires min guard assist for ambulatory ADL transfers with RW needing cues for safety d/t impulsivity. Pt completed 3/3 toilteing tasks with supervision, pt able to stand at sink for grooming tasks with min guard assist with unilateral support. Pt completed functional ambulation greater than a household distance with Rw and min guard assist, pt required 1 L to maintain SpO2 >90%. Pt would continue to benefit from skilled occupational therapy while admitted and after d/c to address the below listed limitations in order to improve overall functional mobility and facilitate independence with BADL participation. DC plan remains appropriate, will follow acutely per POC.      Recommendations for follow up therapy are one component of a multi-disciplinary discharge planning process, led by the attending physician.  Recommendations may be updated based on patient status, additional functional criteria and insurance authorization.    Follow Up Recommendations  Acute inpatient rehab (3hours/day)     Assistance Recommended at Discharge Frequent or constant Supervision/Assistance  Patient can return home with the following  A little help with walking and/or transfers;A lot of help with  bathing/dressing/bathroom;Assistance with cooking/housework;Direct supervision/assist for medications management;Direct supervision/assist for financial management;Assist for transportation;Help with stairs or ramp for entrance   Equipment Recommendations  Other (comment) (defer to next venue of care)    Recommendations for Other Services      Precautions / Restrictions Precautions Precautions: Fall Precaution Comments: 1L to maintain sats with activity Restrictions Weight Bearing Restrictions: No       Mobility Bed Mobility Overal bed mobility: Needs Assistance Bed Mobility: Supine to Sit     Supine to sit: Min guard, HOB elevated (+ use of bed rails)     General bed mobility comments: min guard for safety with use of bed rails, pt impulsively transitions    Transfers Overall transfer level: Needs assistance Equipment used: Rolling walker (2 wheels) Transfers: Sit to/from Stand Sit to Stand: Min assist           General transfer comment: MINA to rise from EOB, recliner and toilet seat with use of grab bars. pt unable to stand without UE support. pt also required MIN A initialy to maintain balance as pt shaking upon standing     Balance Overall balance assessment: Needs assistance Sitting-balance support: No upper extremity supported, Feet supported Sitting balance-Leahy Scale: Good Sitting balance - Comments: no LOB donning socks   Standing balance support: Single extremity supported, During functional activity Standing balance-Leahy Scale: Poor Standing balance comment: reliant on at least one UE support for standing ADLs at sink                           ADL either performed or assessed with clinical judgement   ADL Overall ADL's : Needs assistance/impaired     Grooming: Wash/dry hands;Standing;Min guard Grooming Details (indicate cue type and reason): min guard  for safety with no UE support         Upper Body Dressing : Minimal  assistance;Sitting Upper Body Dressing Details (indicate cue type and reason): to don gown as back side cover Lower Body Dressing: Min guard Lower Body Dressing Details (indicate cue type and reason): simulated via pt adjusting socks from EOB,min guard for safety when reaching out of BOS Toilet Transfer: Minimal assistance;Ambulation;Regular Toilet;Grab bars Toilet Transfer Details (indicate cue type and reason): MIN A for safety with Rw Toileting- Clothing Manipulation and Hygiene: Supervision/safety;Sit to/from stand       Functional mobility during ADLs: Min guard;Minimal assistance;Cueing for safety;Rolling walker (2 wheels) General ADL Comments: ADL participation impacted by decreased safety awareness and mild impulsivity, decreased activity tolerance needing 1 L [place] to maintain sats > 90%    Extremity/Trunk Assessment Upper Extremity Assessment Upper Extremity Assessment: Generalized weakness   Lower Extremity Assessment Lower Extremity Assessment: Defer to PT evaluation   Cervical / Trunk Assessment Cervical / Trunk Assessment: Normal    Vision Baseline Vision/History: 0 No visual deficits (per gross assessment) Patient Visual Report: No change from baseline     Perception Perception Perception: Within Functional Limits   Praxis Praxis Praxis: Intact    Cognition Arousal/Alertness: Awake/alert Behavior During Therapy: Impulsive Overall Cognitive Status: Impaired/Different from baseline Area of Impairment: Safety/judgement, Problem solving, Awareness, Attention                   Current Attention Level: Sustained     Safety/Judgement: Decreased awareness of safety, Decreased awareness of deficits Awareness: Intellectual Problem Solving: Slow processing, Requires verbal cues, Difficulty sequencing General Comments: pt impulsive during session, needing verbal cues for sequencing and problem solving ADl tasks. pt AxOx4 asking appropriate questions about DC         Exercises General Exercises - Lower Extremity Long Arc Quad: Strengthening, 15 reps, Both, Seated, 20 reps Other Exercises Other Exercises: standing marches with BUE support for 1 min with CGA to improve functional endurance    Shoulder Instructions       General Comments attempted mobility on RA with SpO2 dropping to 87%, donned 1L with pt able to maintain sats. noted BLE swelling, asked RN about teds    Pertinent Vitals/ Pain       Pain Assessment Pain Assessment: Faces Faces Pain Scale: Hurts a little bit Pain Location: bottom on L foot from swelling Pain Descriptors / Indicators: Discomfort Pain Intervention(s): Limited activity within patient's tolerance, Monitored during session, Repositioned, Other (comment) (asked RN about teds)  Home Living                                          Prior Functioning/Environment              Frequency  Min 2X/week        Progress Toward Goals  OT Goals(current goals can now be found in the care plan section)  Progress towards OT goals: Progressing toward goals  Acute Rehab OT Goals Patient Stated Goal: go to rehab OT Goal Formulation: With patient Time For Goal Achievement: 03/30/22 Potential to Achieve Goals: Good  Plan Discharge plan remains appropriate;Frequency remains appropriate    Co-evaluation                 AM-PAC OT "6 Clicks" Daily Activity     Outcome Measure   Help from another  person eating meals?: None Help from another person taking care of personal grooming?: A Little Help from another person toileting, which includes using toliet, bedpan, or urinal?: A Little Help from another person bathing (including washing, rinsing, drying)?: A Little Help from another person to put on and taking off regular upper body clothing?: A Little Help from another person to put on and taking off regular lower body clothing?: A Little 6 Click Score: 19    End of Session Equipment  Utilized During Treatment: Gait belt;Rolling walker (2 wheels);Oxygen;Other (comment) (1 L [place])  OT Visit Diagnosis: Unsteadiness on feet (R26.81);Muscle weakness (generalized) (M62.81);Other symptoms and signs involving cognitive function   Activity Tolerance Patient tolerated treatment well   Patient Left in chair;with call bell/phone within reach;with chair alarm set   Nurse Communication Mobility status        Time: 3300-7622 OT Time Calculation (min): 24 min  Charges: OT General Charges $OT Visit: 1 Visit OT Treatments $Self Care/Home Management : 23-37 mins  Harley Alto., COTA/L Acute Rehabilitation Services [phone]   Precious Haws 03/20/2022, 9:39 AM

## 2022-03-20 NOTE — Progress Notes (Signed)
Mobility Specialist: Progress Note   03/20/22 1428  Mobility  Activity Ambulated with assistance in hallway  Level of Assistance Contact guard assist, steadying assist  Assistive Device Front wheel walker  Distance Ambulated (ft) 350 ft  Activity Response Tolerated well  Mobility Referral Yes  $Mobility charge 1 Mobility   Pre-Mobility: 58 HR, 96% SpO2 During Mobility: 58 HR, 94% SpO2 Post-Mobility: 68 HR, 94% SpO2  Pt received in the bed sleeping, easily awakened and agreeable to mobility. Ambulated on 1 L/min [place]. C/o mild dizziness, resolved with distance, as well as mild SOB. Cues for RW proximity and posture. Pt sitting EOB after session with call bell and phone in reach.   Seymour Seri Kimmer Mobility Specialist Please contact via SecureChat or Rehab office at [phone]

## 2022-03-20 NOTE — Progress Notes (Signed)
Inpatient Rehab Admissions Coordinator:    CIR following for admit once medically ready. Per nephrology, hold admit today due to worsening renal function.   Clemens Catholic, [place], [place] Admissions Coordinator  [phone] ([name]) [phone] (office)

## 2022-03-21 DIAGNOSIS — N184 Chronic kidney disease, stage 4 (severe): Secondary | ICD-10-CM | POA: Diagnosis not present

## 2022-03-21 DIAGNOSIS — N179 Acute kidney failure, unspecified: Secondary | ICD-10-CM | POA: Diagnosis not present

## 2022-03-21 LAB — RENAL FUNCTION PANEL
Albumin: 2.9 g/dL — ABNORMAL LOW (ref 3.5–5.0)
Albumin: 3.1 g/dL — ABNORMAL LOW (ref 3.5–5.0)
Anion gap: 9 (ref 5–15)
Anion gap: 13 (ref 5–15)
BUN: 83 mg/dL — ABNORMAL HIGH (ref 8–23)
BUN: 85 mg/dL — ABNORMAL HIGH (ref 8–23)
CO2: 23 mmol/L (ref 22–32)
CO2: 24 mmol/L (ref 22–32)
Calcium: 7.9 mg/dL — ABNORMAL LOW (ref 8.9–10.3)
Calcium: 8.4 mg/dL — ABNORMAL LOW (ref 8.9–10.3)
Chloride: 105 mmol/L (ref 98–111)
Chloride: 102 mmol/L (ref 98–111)
Creatinine, Ser: 4.26 mg/dL — ABNORMAL HIGH (ref 0.61–1.24)
Creatinine, Ser: 4.42 mg/dL — ABNORMAL HIGH (ref 0.61–1.24)
GFR, Estimated: 14 mL/min — ABNORMAL LOW (ref >60)
GFR, Estimated: 14 mL/min — ABNORMAL LOW (ref >60)
Glucose, Bld: 223 mg/dL — ABNORMAL HIGH (ref 70–99)
Glucose, Bld: 210 mg/dL — ABNORMAL HIGH (ref 70–99)
Phosphorus: 4.4 mg/dL (ref 2.5–4.6)
Phosphorus: 3.9 mg/dL (ref 2.5–4.6)
Potassium: 4.5 mmol/L (ref 3.5–5.1)
Potassium: 4.9 mmol/L (ref 3.5–5.1)
Sodium: 137 mmol/L (ref 135–145)
Sodium: 139 mmol/L (ref 135–145)

## 2022-03-21 LAB — GLUCOSE, CAPILLARY
Glucose-Capillary: 193 mg/dL — ABNORMAL HIGH (ref 70–99)
Glucose-Capillary: 107 mg/dL — ABNORMAL HIGH (ref 70–99)
Glucose-Capillary: 201 mg/dL — ABNORMAL HIGH (ref 70–99)
Glucose-Capillary: 219 mg/dL — ABNORMAL HIGH (ref 70–99)

## 2022-03-21 LAB — MAGNESIUM: Magnesium: 2.7 mg/dL — ABNORMAL HIGH (ref 1.7–2.4)

## 2022-03-21 MED ORDER — FUROSEMIDE 10 MG/ML IJ SOLN
80.0000 mg | Freq: Two times a day (BID) | INTRAMUSCULAR | Status: AC
  Administered 2022-03-21 – 2022-03-25 (×10): 80 mg via INTRAVENOUS
  Filled 2022-03-21 (×10): qty 8

## 2022-03-21 MED FILL — AUSTEDO 6 MG TABLET: ORAL | 30 days supply | Qty: 60 | Fill #6

## 2022-03-21 NOTE — Progress Notes (Signed)
Physical Therapy Treatment Patient Details Name: Nathan Tyler MRN: [id number] DOB: [birth year] Today's Date: 03/21/2022   History of Present Illness 66 y.o. male admitted to [facility] 11/17 with dyspnea, weakness, respiratory failure and acute worsening renal failure. CRRT 11/19-11/21. Transferred to [facility] 11/23.  PMhx: renal transplant with CKD, T2DM, HTN, hypothyroidism,  movement disorder, recent admission 11/1-11/8/23 for CAP    PT Comments    Pt eager to mobilize OOB, and get to AIR although states he can't go yet because "my kidneys don't work". Pt ambulatory in hallway with use of RW and cues for form and safety, tolerated well even with dynamic gait challenges. Pt challenged with sit<>stands without UE support , struggles with postural adjustment and steadying when transitioning sit>stand. Session cut short because visitor arrived to room, PT to continue to follow acutely.     Recommendations for follow up therapy are one component of a multi-disciplinary discharge planning process, led by the attending physician.  Recommendations may be updated based on patient status, additional functional criteria and insurance authorization.  Follow Up Recommendations  Acute inpatient rehab (3hours/day) Can patient physically be transported by private vehicle: Yes   Assistance Recommended at Discharge Frequent or constant Supervision/Assistance  Patient can return home with the following A little help with walking and/or transfers;A little help with bathing/dressing/bathroom;Direct supervision/assist for financial management;Direct supervision/assist for medications management;Assist for transportation   Equipment Recommendations  None recommended by PT    Recommendations for Other Services       Precautions / Restrictions Precautions Precautions: Fall Precaution Comments: 1L to maintain sats with activity Restrictions Weight Bearing Restrictions: No     Mobility  Bed Mobility Overal bed  mobility: Needs Assistance Bed Mobility: Supine to Sit     Supine to sit: Supervision, HOB elevated          Transfers Overall transfer level: Needs assistance Equipment used: None Transfers: Sit to/from Stand Sit to Stand: Min assist           General transfer comment: light posterior steadying assist with repeated sit<>stands without UE support, min guard with use of RW for safety and cues for form    Ambulation/Gait Ambulation/Gait assistance: Min guard Gait Distance (Feet): 360 Feet Assistive device: Rolling walker (2 wheels) Gait Pattern/deviations: Step-through pattern, Decreased stride length, Trunk flexed, Wide base of support Gait velocity: decr     General Gait Details: cues for upright posture, hallway navigation. PT challenged pt balance and dual task with step over objects, directional changes   Stairs             Wheelchair Mobility    Modified Rankin (Stroke Patients Only)       Balance Overall balance assessment: Needs assistance Sitting-balance support: No upper extremity supported, Feet supported Sitting balance-Leahy Scale: Good     Standing balance support: Single extremity supported, During functional activity Standing balance-Leahy Scale: Poor Standing balance comment: reliant on UE support dynamically               High Level Balance Comments: step over object x8 in hallway, 180 deg turn and stop, fast/slow gait, head turns            Cognition Arousal/Alertness: Awake/alert Behavior During Therapy: Impulsive Overall Cognitive Status: Impaired/Different from baseline Area of Impairment: Safety/judgement, Problem solving, Awareness, Attention                   Current Attention Level: Sustained     Safety/Judgement: Decreased awareness of safety,  Decreased awareness of deficits Awareness: Intellectual Problem Solving: Slow processing, Requires verbal cues, Difficulty sequencing          Exercises  Other Exercises Other Exercises: sit<>stands, 1x10 and 1x5 after 2 minute rest break to recover fatigue. no UE support, min posterior assist to steady    General Comments        Pertinent Vitals/Pain Pain Assessment Pain Assessment: Faces Faces Pain Scale: No hurt Pain Intervention(s): Monitored during session    Home Living                          Prior Function            PT Goals (current goals can now be found in the care plan section) Acute Rehab PT Goals Patient Stated Goal: Get well go home PT Goal Formulation: With patient Time For Goal Achievement: 03/27/22 Potential to Achieve Goals: Good Progress towards PT goals: Progressing toward goals    Frequency    Min 3X/week      PT Plan Current plan remains appropriate    Co-evaluation              AM-PAC PT "6 Clicks" Mobility   Outcome Measure  Help needed turning from your back to your side while in a flat bed without using bedrails?: A Little Help needed moving from lying on your back to sitting on the side of a flat bed without using bedrails?: A Little Help needed moving to and from a bed to a chair (including a wheelchair)?: A Little Help needed standing up from a chair using your arms (e.g., wheelchair or bedside chair)?: A Little Help needed to walk in hospital room?: A Little Help needed climbing 3-5 steps with a railing? : A Lot 6 Click Score: 17    End of Session Equipment Utilized During Treatment: Gait belt;Oxygen Activity Tolerance: Patient tolerated treatment well Patient left: with call bell/phone within reach;in chair;with chair alarm set;with family/visitor present Nurse Communication: Mobility status PT Visit Diagnosis: Unsteadiness on feet (R26.81);Muscle weakness (generalized) (M62.81)     Time: 6283-1517 PT Time Calculation (min) (ACUTE ONLY): 15 min  Charges:  $Gait Training: 8-22 mins                     Stacie Glaze, PT DPT Acute Rehabilitation Services Pager  [phone]  Office [phone]    [place] 03/21/2022, 10:42 AM

## 2022-03-21 NOTE — Progress Notes (Signed)
PROGRESS NOTE Nathan Tyler  GBE:[id number] DOB: [birth year] DOA: 03/09/2022 PCP: Celene Squibb, MD   Brief Narrative/Hospital Course: 66 year old male with PMH CKD4 s/p transplant, chorea, DM II, HTN, HLD, hypothyroidism who presented with SOB. He was recently been hospitalized from 11/1 - 11/8 as well for CAP and completed antibiotics in the hospital and was discharged home with home health. Seen in the ER, CXR was more concerning for volume overload and he was found to have worsened renal function.  He was evaluated by nephrology and admitted for acute hypoxic respiratory failure, acute metabolic encephalopathy and started on CRRT for fluid overload and acutely worsening renal failure on the CT of CKD stage IV in a transplant kidney.Patient was transferred from [facility] to [facility] hospital 03/15/22. Followed by nephrology AKI improved back to baseline CK.Mental status improved> IR removed [facility] 11/27.  Patient is delayed fluid overload with dyspnea with activity, leg edema given IV Lasix 02/2016, 11/28 with close monitoring of renal function Once renal function/fluid overload stabilizes planning to discharge to CIR  Subjective:  Seen and examined, ambulating in the hallway with PT, on supplemental oxygen  Still with edematous leg no new complaints  Assessment and Plan: Principal Problem:   Acute renal failure superimposed on stage 4 chronic kidney disease ([place]) Active Problems:   Acute respiratory failure with hypoxia (HCC)   Acute on chronic anemia   DM2 (diabetes mellitus, type 2) (HCC)   Acute renal failure (HCC)   Malnutrition of moderate degree   AKI on CKD stage IV on transplant kidney: Creatinine peaked to 5.9, seen by nephrology.On CRRT 11/19-11/21.  UA negative.  Urine lites consistent with prerenal AKI, ultrasound normal.  Right TDC removed 11/27, Foley removed 11/27> voiding.patient still has fluid overload with elevated creatinine being monitored inpatient, on diuretics and metolazone for  fluid overload per nephrology, strict I/O has been ordered Recent Labs  Lab 03/19/22 0416 03/19/22 1557 03/20/22 0413 03/20/22 1536 03/21/22 0623  BUN 67* 67* 77* 78* 83*  CREATININE 3.79* 4.09* 4.26* 4.32* 4.26*    Intake/Output Summary (Last 24 hours) at 03/21/2022 1021 Last data filed at 03/21/2022 0951 Gross per 24 hour  Intake 490 ml  Output --  Net 490 ml   Acute hypoxic respiratory failure due to pulmonary edema: Currently doing well on room air.w/ PT was on [place].    Lower leg edema 2+ pitting edema Fluid overload: appreciate nephrology input> s/p lasix iv 11/27 and 11/28> now on 80 mg twice daily, got a dose of metolazone.  Monitor intake output. Encourage PT OT and mobilize Net IO Since Admission: -6,684.85 mL [03/21/22 7588]   Acute metabolic encephalopathy due to sundowning hypoxia uremia renal failure needing Precedex initially.  Currently alert awake oriented conversant.    Status post renal transplant 2008 -chronically immunosuppressed on tacrolimus, mycophenolate, and low-dose prednisone.  Monitor renal function   Anemia of chronic kidney disease: stable.  Monitor as below and transfuse if less than 7 g Recent Labs  Lab 03/15/22 0544 03/16/22 0143 03/17/22 0204 03/19/22 0416  HGB 7.5* 7.8* 7.8* 7.9*  HCT 25.9* 25.0* 24.4* 26.2*     DM2 with poorly controlled hyperglycemia:HbA1c 5.7.At home on 5 units 3 times daily short-acting, Trulicity weekly.patient on prednisone.  Blood sugar fairly controlled on semglee > 10 u while here, continue NovoLog 5 units pre-meal, SSI. Recent Labs  Lab 03/20/22 0836 03/20/22 1131 03/20/22 1524 03/20/22 2124 03/21/22 0714  GLUCAP 262* 188* 139* 228* 193*   HTN: BP stable  continue amlodipine 10 Coreg 12.5, hydralazine '50mg'$  tid XHB:ZJIRCVEL Lipitor Hypothyroidism: Continue home dose of Synthroid Involuntary movements/chorea-chronic:Continue home dose of Austedo  Class II Obesity:Patient's Body mass index is 34.9 kg/m. :  Will benefit with PCP follow-up, weight loss  healthy lifestyle and outpatient sleep evaluation.  Deconditioning/debility continue PT OT recommending CIR> plan for CIR once renal function is stable  Moderate malnutrition augment diet as below Nutrition Problem: Moderate Malnutrition Etiology: chronic illness (renal transplant 2008 with CKD 4 on chonic immunosuppression) Signs/Symptoms: mild muscle depletion, mild fat depletion Interventions: Liberalize Diet, Refer to RD note for recommendations, MVI, Ensure Enlive (each supplement provides 350kcal and 20 grams of protein)  DVT prophylaxis: heparin injection 5,000 Units Start: 03/09/22 2200 Code Status:   Code Status: DNR Family Communication: plan of care discussed with patient  Patient status is: Inpatient because of acute renal failure Level of care: Med-Surg  Dispo: The patient is from: home w/ wife            Anticipated disposition: CIR once renal function is stable, monitoring at least 24 hours inpatient   Objective: Vitals last 24 hrs: Vitals:   03/20/22 1943 03/21/22 0421 03/21/22 0543 03/21/22 0758  BP: (!) 145/66  (!) 142/64 (!) 140/61  Pulse: 64  65 61  Resp: '16  17 18  '$ Temp: 98.3 F (36.8 C)  98.1 F (36.7 C) 97.6 F (36.4 C)  TempSrc: Oral  Oral Oral  SpO2: 93%  95% 91%  Weight:  113.5 kg    Height:       Weight change: -0.4 kg  Physical Examination: General exam: AAox3, weak,older appearing HEENT:Oral mucosa moist, Ear/Nose WNL grossly, dentition normal. Respiratory system: bilaterally clear BS, no use of accessory muscle Cardiovascular system: S1 & S2 +, regular rate, JVD neg. Gastrointestinal system: Abdomen soft, obeseNT,ND,BS+ Nervous System:Alert, awake, moving extremities and grossly nonfocal.  Involuntary movement of head + Extremities: LE ankle edema ++, lower extremities warm Skin: No rashes,no icterus. MSK: Normal muscle bulk,tone, power    Medications reviewed:  Scheduled Meds:  amLODipine   10 mg Oral Daily   atorvastatin  10 mg Oral Daily   carvedilol  12.5 mg Oral BID   Deutetrabenazine  6 mg Oral BID   feeding supplement (GLUCERNA SHAKE)  237 mL Oral BID BM   furosemide  80 mg Intravenous BID   heparin  5,000 Units Subcutaneous Q8H   hydrALAZINE  50 mg Oral TID   insulin aspart  0-9 Units Subcutaneous TID WC   insulin aspart  5 Units Subcutaneous TID WC   insulin glargine-yfgn  10 Units Subcutaneous QHS   levothyroxine  112 mcg Oral Q0600   lipase/protease/amylase  72,000 Units Oral TID WC   melatonin  3 mg Oral QHS   multivitamin  1 tablet Oral QHS   mycophenolate  180 mg Oral BID   predniSONE  5 mg Oral Q breakfast   QUEtiapine  50 mg Oral QHS   senna  1 tablet Oral BID   tacrolimus  1.5 mg Oral BID   Continuous Infusions:    Diet Order             Diet renal/carb modified with fluid restriction Diet-HS Snack? Nothing; Fluid restriction: 1200 mL Fluid; Room service appropriate? Yes; Fluid consistency: Thin  Diet effective now                  Intake/Output Summary (Last 24 hours) at 03/21/2022 1021 Last data filed at 03/21/2022 [phone]  Gross per 24 hour  Intake 490 ml  Output --  Net 490 ml    Net IO Since Admission: -6,684.85 mL [03/21/22 1021]  Wt Readings from Last 3 Encounters:  03/21/22 113.5 kg  02/22/22 104.3 kg  11/24/21 107 kg     Unresulted Labs (From admission, onward)     Start     Ordered   03/12/22 1600  Renal function panel (daily at 1600)  Daily at 1600,   R (with TIMED occurrences)     Question:  Specimen collection method  Answer:  Lab=Lab collect   03/11/22 1626   03/12/22 0500  Renal function panel (daily at 0500)  Daily at 5am,   R     Question:  Specimen collection method  Answer:  Lab=Lab collect   03/11/22 1626   03/12/22 0500  Magnesium  Daily at 5am,   R     Question:  Specimen collection method  Answer:  Lab=Lab collect   03/11/22 1626   Signed and Held  CBC  (heparin)  Once,   R       Comments: Baseline for  heparin therapy IF NOT ALREADY DRAWN.  Notify MD if PLT < 100 K.   Question:  Specimen collection method  Answer:  Lab=Lab collect   Signed and Held   Signed and Held  Creatinine, serum  (heparin)  Once,   R       Comments: Baseline for heparin therapy IF NOT ALREADY DRAWN.   Question:  Specimen collection method  Answer:  Lab=Lab collect   Signed and Held   Signed and Held  Comprehensive metabolic panel  Tomorrow morning,   R       Question:  Specimen collection method  Answer:  Lab=Lab collect   Signed and Held   Signed and Held  CBC with Differential/Platelet  Tomorrow morning,   R       Question:  Specimen collection method  Answer:  Lab=Lab collect   Signed and Held          Data Reviewed: I have personally reviewed following labs and imaging studies CBC: Recent Labs  Lab 03/15/22 0544 03/16/22 0143 03/17/22 0204 03/19/22 0416  WBC 6.0 6.1 6.4 6.0  HGB 7.5* 7.8* 7.8* 7.9*  HCT 25.9* 25.0* 24.4* 26.2*  MCV 99.6 96.5 94.9 97.8  PLT 185 180 175 163    Basic Metabolic Panel: Recent Labs  Lab 03/17/22 0204 03/17/22 1611 03/18/22 0212 03/18/22 1608 03/19/22 0416 03/19/22 1557 03/20/22 0413 03/20/22 1536 03/21/22 0623  NA 137   < > 137   < > 138 137 134* 138 137  K 4.2   < > 4.5   < > 4.5 5.4* 4.9 5.1 4.5  CL 103   < > 105   < > 107 107 103 105 105  CO2 24   < > 24   < > '25 22 24 24 23  '$ GLUCOSE 307*   < > 318*   < > 167* 290* 313* 151* 223*  BUN 63*   < > 63*   < > 67* 67* 77* 78* 83*  CREATININE 3.69*   < > 3.62*   < > 3.79* 4.09* 4.26* 4.32* 4.26*  CALCIUM 7.8*   < > 7.6*   < > 7.8* 8.2* 7.7* 8.3* 7.9*  MG 2.6*  --  2.5*  --  2.6*  --  2.6*  --  2.7*  PHOS 3.2   < > 3.6   < >  4.2 4.5 4.6 4.3 4.4   < > = values in this interval not displayed.    No results found for this or any previous visit (from the past 240 hour(s)).  Antimicrobials: Anti-infectives (From admission, onward)    Start     Dose/Rate Route Frequency Ordered Stop   03/14/22 1315   ceFAZolin (ANCEF) IVPB 2g/100 mL premix        2 g 200 mL/hr over 30 Minutes Intravenous On call to O.R. 03/14/22 1300 03/14/22 1400   03/11/22 1600  levofloxacin (LEVAQUIN) IVPB 500 mg  Status:  Discontinued        500 mg 100 mL/hr over 60 Minutes Intravenous Every 48 hours 03/09/22 1808 03/11/22 1539   03/09/22 1600  levofloxacin (LEVAQUIN) IVPB 500 mg        500 mg 100 mL/hr over 60 Minutes Intravenous  Once 03/09/22 1551 03/09/22 1730      Culture/Microbiology    Component Value Date/Time   SDES BLOOD LEFT FOREARM 02/21/2022 2236   SPECREQUEST  02/21/2022 2236    BOTTLES DRAWN AEROBIC AND ANAEROBIC Blood Culture adequate volume   CULT  02/21/2022 2236    NO GROWTH 5 DAYS Performed at [facility] [address], [place], [place] [address]    REPTSTATUS 02/26/2022 FINAL 02/21/2022 2236    Radiology Studies: IR Removal Tun Cv Cath W/O FL  Result Date: 03/19/2022 INDICATION: Patient with AKI on CKD status post tunneled hemodialysis catheter placement 03/14/2022, who presents with improved renal function, no longer requires hemodialysis. Request for tunneled hemodialysis catheter removal. EXAM: REMOVAL OF TUNNELED HEMODIALYSIS CATHETER MEDICATIONS: None COMPLICATIONS: None immediate. PROCEDURE: Informed written consent was obtained from the patient following an explanation of the procedure, risks, benefits and alternatives to treatment. A time out was performed prior to the initiation of the procedure. Sterile technique was utilized including mask, sterile gloves, sterile drape, and hand hygiene. ChloraPrep was used to prep the patient's right neck, chest and existing catheter. The catheter was removed intact by applying manual retraction. Hemostasis was obtained with manual compression. A dressing was placed. The patient tolerated the procedure well without immediate post procedural complication. IMPRESSION: Successful removal of a RIGHT chest tunneled dialysis catheter. Read by:  Durenda Guthrie, PA-C Electronically Signed   By: Michaelle Birks M.D.   On: 03/19/2022 15:12     LOS: 12 days   Antonieta Pert, MD [facility]  03/21/2022, 10:21 AM

## 2022-03-21 NOTE — Progress Notes (Signed)
Mobility Specialist Progress Note:   03/21/22 1428  Mobility  Activity Ambulated with assistance in hallway  Level of Assistance Contact guard assist, steadying assist  Assistive Device Front wheel walker  Distance Ambulated (ft) 500 ft  Activity Response Tolerated well  Mobility Referral Yes  $Mobility charge 1 Mobility   Pre-mobility: 94% SpO2 During mobility: 90% SpO2 Post-mobility: 93% SpO2  Pt received in bed and agreeable. No complaints of SOB or pain. Pt left in bed with all needs met and call bell in reach.   Andrey Campanile Mobility Specialist Please contact via SecureChat or  Rehab office at [phone]

## 2022-03-21 NOTE — Progress Notes (Signed)
Patient has attempted to void independently all day with little success. Writer bladder scanned patient and saw 450 ml on bladder scan. Cathed patient and got 1290ms out. Messaged attending to keep them updated.

## 2022-03-21 NOTE — Progress Notes (Signed)
[facility] Progress Note  Subjective:  There was no urine output charted over 11/28.  The patient states that he urinated several times yesterday.  He does not have an AVF.  He feels ok.  Note CIR was contacted about the patient and they have reached out about medical stability for transfer.  They anticipate a relatively short stay there (about 5 days).  I have let them know that I do not medically recommend a transfer to CIR at this time.    Review of systems:   Shortness of breath is better he denies chest pain  Denies n/v  Vitals:   03/20/22 1943 03/21/22 0421 03/21/22 0543 03/21/22 0758  BP: (!) 145/66  (!) 142/64 (!) 140/61  Pulse: 64  65 61  Resp: _0 Temp: 98.3 F (36.8 C)  98.1 F (36.7 C) 97.6 F (36.4 C)  TempSrc: Oral  Oral Oral  SpO2: 93%  95% 91%  Weight:  113.5 kg    Height:        Physical Exam:    General adult male in bed in no acute distress HEENT normocephalic atraumatic extraocular movements intact sclera anicteric Neck supple trachea midline Lungs clear to auscultation bilaterally normal work of breathing at rest  Heart S1S2 no rub Abdomen soft nontender nondistended Extremities 2-3+ edema lower extremities Psych normal mood and affect Neuro - alert and oriented  GU -  no foley       Date                           Creat               eGFR   2017-2018                 1.11- 1.56   2020                          1.88   2021                          1.54- 2.09   Sept 2023                  3.56                 18 ml/min    11/1- 02/28/22           4.38 >> 3.28    AKI on CKD4, 14 >> 20 ml/min    11/17                                    5.90                 10     11/18                                    5.78                 10       UA negative    UNa <10, UCr 151    CXR - IMPRESSION: Very low lung volumes. Bilateral patchy airspace opacities may represent a combination of bronchovascular crowding, pulmonary edema, and possible  multifocal infection.       Assessment/ Plan: 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 Likely ATN in setting of advanced CKD.  UA neg, urine lytes c/w pre-renal.  11/20 Tplt renal US normal/ no hydro.  IR consulted for [facility], R IJ placed 11/22.  CRRT stopped on 11/21 Lasix 80 mg IV BID  S/p metolazone 5 mg once on 11/28 I have re-ordered strict ins/outs and reinforced the importance of same  CKD stage IV of renal allograft - baseline Cr 3.3 - 3.6 (eGFR 18-20) per nephrology charting.  Follows with Dr. Marval Regal Renal transplant - done around 2010, cont prograf, myfortic and pred. Renal US stable.  DM2 - Per primary team  HTN - acceptable on current regimen  Movement disorder - chronic issue, this is not myoclonus but is his baseline "chorea" per the sister. Getting Austedo per primary    Disposition - continue inpatient monitoring; would please hold off on a transfer to CIR today with his increase in creatinine and continued overload.  He is not medically stable for transfer out   Recent Labs  Lab 03/17/22 0204 03/17/22 1611 03/19/22 0416 03/19/22 1557 03/20/22 1536 03/21/22 0623  HGB 7.8*  --  7.9*  --   --   --   ALBUMIN 2.7*   < > 2.7*   < > 3.1* 2.9*  CALCIUM 7.8*   < > 7.8*   < > 8.3* 7.9*  PHOS 3.2   < > 4.2   < > 4.3 4.4  CREATININE 3.69*   < > 3.79*   < > 4.32* 4.26*  K 4.2   < > 4.5   < > 5.1 4.5   < > = values in this interval not displayed.   No results for input(s): "IRON", "TIBC", "FERRITIN" in the last 168 hours. Inpatient medications:  amLODipine  10 mg Oral Daily   atorvastatin  10 mg Oral Daily   carvedilol  12.5 mg Oral BID   Deutetrabenazine  6 mg Oral BID   feeding supplement (GLUCERNA SHAKE)  237 mL Oral BID BM   heparin  5,000 Units Subcutaneous Q8H   hydrALAZINE  50 mg Oral TID   insulin aspart  0-9 Units Subcutaneous TID WC   insulin  aspart  5 Units Subcutaneous TID WC   insulin glargine-yfgn  10 Units Subcutaneous QHS   levothyroxine  112 mcg Oral Q0600   lipase/protease/amylase  72,000 Units Oral TID WC   melatonin  3 mg Oral QHS   multivitamin  1 tablet Oral QHS   mycophenolate  180 mg Oral BID   predniSONE  5 mg Oral Q breakfast   QUEtiapine  50 mg Oral QHS   senna  1 tablet Oral BID   tacrolimus  1.5 mg Oral BID     acetaminophen, albuterol, hydrALAZINE, naphazoline-glycerin, ondansetron **OR** ondansetron (ZOFRAN) IV, polyethylene glycol   Claudia Desanctis, MD 8:47 AM 03/21/2022

## 2022-03-21 NOTE — Progress Notes (Signed)
Instructed patient that any time he uses the bathroom he needs to urinate into the urinal so that we can document output. Patient agrees. Patient went to the bathroom then came back out and the urinal was empty. Stated he had BM but no urine. Will continue to encourage use of urinal for tracking output.

## 2022-03-22 DIAGNOSIS — N184 Chronic kidney disease, stage 4 (severe): Secondary | ICD-10-CM | POA: Diagnosis not present

## 2022-03-22 DIAGNOSIS — N179 Acute kidney failure, unspecified: Secondary | ICD-10-CM | POA: Diagnosis not present

## 2022-03-22 LAB — RENAL FUNCTION PANEL
Albumin: 3 g/dL — ABNORMAL LOW (ref 3.5–5.0)
Anion gap: 10 (ref 5–15)
BUN: 88 mg/dL — ABNORMAL HIGH (ref 8–23)
CO2: 25 mmol/L (ref 22–32)
Calcium: 8.4 mg/dL — ABNORMAL LOW (ref 8.9–10.3)
Chloride: 104 mmol/L (ref 98–111)
Creatinine, Ser: 4.55 mg/dL — ABNORMAL HIGH (ref 0.61–1.24)
GFR, Estimated: 13 mL/min — ABNORMAL LOW (ref >60)
Glucose, Bld: 142 mg/dL — ABNORMAL HIGH (ref 70–99)
Phosphorus: 4.4 mg/dL (ref 2.5–4.6)
Potassium: 4.4 mmol/L (ref 3.5–5.1)
Sodium: 139 mmol/L (ref 135–145)

## 2022-03-22 LAB — GLUCOSE, CAPILLARY
Glucose-Capillary: 155 mg/dL — ABNORMAL HIGH (ref 70–99)
Glucose-Capillary: 125 mg/dL — ABNORMAL HIGH (ref 70–99)
Glucose-Capillary: 251 mg/dL — ABNORMAL HIGH (ref 70–99)
Glucose-Capillary: 278 mg/dL — ABNORMAL HIGH (ref 70–99)

## 2022-03-22 LAB — MAGNESIUM: Magnesium: 2.5 mg/dL — ABNORMAL HIGH (ref 1.7–2.4)

## 2022-03-22 NOTE — Progress Notes (Signed)
Occupational Therapy Treatment Patient Details Name: Nathan Tyler MRN: [id number] DOB: May 12, 1955 Today's Date: 03/22/2022   History of present illness 66 y.o. male admitted to [facility] 11/17 with dyspnea, weakness, respiratory failure and acute worsening renal failure. CRRT 11/19-11/21. Transferred to [facility] 11/23.  PMhx: renal transplant with CKD, T2DM, HTN, hypothyroidism,  movement disorder, recent admission 11/1-11/8/23 for CAP   OT comments  Patient up in recliner upon entry. Patient able to stand with min guard assists and stood at sink for self care tasks with cues for sequencing and safety. Patient changed gown and socks seated with patient able to doff socks but required assistance to donn right sock. Patient performed mobility and transfers with min guard assist for safety. Patient is impulsive, impacting safety. Acute OT to continue to follow.    Recommendations for follow up therapy are one component of a multi-disciplinary discharge planning process, led by the attending physician.  Recommendations may be updated based on patient status, additional functional criteria and insurance authorization.    Follow Up Recommendations  Acute inpatient rehab (3hours/day)     Assistance Recommended at Discharge Frequent or constant Supervision/Assistance  Patient can return home with the following  A little help with walking and/or transfers;A lot of help with bathing/dressing/bathroom;Assistance with cooking/housework;Direct supervision/assist for medications management;Direct supervision/assist for financial management;Assist for transportation;Help with stairs or ramp for entrance   Equipment Recommendations  Other (comment) (defer to next venue of care)    Recommendations for Other Services      Precautions / Restrictions Precautions Precautions: Fall Restrictions Weight Bearing Restrictions: No       Mobility Bed Mobility Overal bed mobility: Needs Assistance              General bed mobility comments: OOB in recliner    Transfers Overall transfer level: Needs assistance Equipment used: Rolling walker (2 wheels) Transfers: Sit to/from Stand Sit to Stand: Min guard           General transfer comment: min guard to stand and for transfers with cues for safety     Balance Overall balance assessment: Needs assistance Sitting-balance support: No upper extremity supported, Feet supported Sitting balance-Leahy Scale: Good     Standing balance support: Single extremity supported, No upper extremity supported, Bilateral upper extremity supported, During functional activity Standing balance-Leahy Scale: Poor Standing balance comment: able to stand at sink for self care                           ADL either performed or assessed with clinical judgement   ADL Overall ADL's : Needs assistance/impaired     Grooming: Wash/dry hands;Wash/dry face;Oral care;Min guard;Cueing for sequencing Grooming Details (indicate cue type and reason): stood at sink with min guard and cues for sequencing and to switch tasks     Lower Body Bathing: Minimal assistance;Sitting/lateral leans;Sit to/from stand Lower Body Bathing Details (indicate cue type and reason): able to wash bottom standing at sink and required assistance to bathe right foot Upper Body Dressing : Minimal assistance;Sitting Upper Body Dressing Details (indicate cue type and reason): to donn gown and to don gown as back side cover Lower Body Dressing: Minimal assistance;Sitting/lateral leans Lower Body Dressing Details (indicate cue type and reason): required assistance to donn right sock             Functional mobility during ADLs: Min guard General ADL Comments: frequent cues for safety and cues for sequencing for self care  tasks standing at sink    Extremity/Trunk Assessment              Vision       Perception     Praxis      Cognition Arousal/Alertness:  Awake/alert Behavior During Therapy: Impulsive Overall Cognitive Status: Impaired/Different from baseline Area of Impairment: Safety/judgement, Problem solving, Awareness, Attention                   Current Attention Level: Sustained     Safety/Judgement: Decreased awareness of safety, Decreased awareness of deficits Awareness: Intellectual Problem Solving: Slow processing, Requires verbal cues, Difficulty sequencing General Comments: frequent cues for safety and sequencing with self care tasks        Exercises      Shoulder Instructions       General Comments      Pertinent Vitals/ Pain       Pain Assessment Pain Assessment: Faces Faces Pain Scale: No hurt Pain Intervention(s): Monitored during session  Home Living                                          Prior Functioning/Environment              Frequency  Min 2X/week        Progress Toward Goals  OT Goals(current goals can now be found in the care plan section)  Progress towards OT goals: Progressing toward goals  Acute Rehab OT Goals Patient Stated Goal: get better OT Goal Formulation: With patient Time For Goal Achievement: 03/30/22 Potential to Achieve Goals: Good ADL Goals Pt Will Perform Grooming: with supervision;standing Pt Will Perform Lower Body Bathing: with supervision;sit to/from stand Pt Will Perform Lower Body Dressing: with supervision;sit to/from stand Pt Will Transfer to Toilet: with supervision;ambulating;bedside commode Pt Will Perform Toileting - Clothing Manipulation and hygiene: with supervision;sit to/from stand Pt Will Perform Tub/Shower Transfer: with supervision;with modified independence;ambulating Additional ADL Goal #1: Pt will verbalize and demo 3 ECTs during ADLs and ADLmobility tasks  Plan Discharge plan remains appropriate;Frequency remains appropriate    Co-evaluation                 AM-PAC OT "6 Clicks" Daily Activity      Outcome Measure   Help from another person eating meals?: None Help from another person taking care of personal grooming?: A Little Help from another person toileting, which includes using toliet, bedpan, or urinal?: A Little Help from another person bathing (including washing, rinsing, drying)?: A Little Help from another person to put on and taking off regular upper body clothing?: A Little Help from another person to put on and taking off regular lower body clothing?: A Little 6 Click Score: 19    End of Session Equipment Utilized During Treatment: Gait belt;Rolling walker (2 wheels)  OT Visit Diagnosis: Unsteadiness on feet (R26.81);Muscle weakness (generalized) (M62.81);Other symptoms and signs involving cognitive function   Activity Tolerance Patient tolerated treatment well   Patient Left in chair;with call bell/phone within reach;with chair alarm set   Nurse Communication Mobility status        Time: [phone] OT Time Calculation (min): 36 min  Charges: OT General Charges $OT Visit: 1 Visit OT Treatments $Self Care/Home Management : 8-22 mins $Therapeutic Activity: 8-22 mins  Lodema Hong, [place]  Office [phone]   Trixie Dredge 03/22/2022, 10:05 AM

## 2022-03-22 NOTE — Progress Notes (Signed)
Pt called for nurse to assist them to bed from the bathroom. Upon entering the room pt was sitting on the bed and a large amount of urine on the floor. Pt has 250 mL of urine in urinal and reports they peed on the way to the bathroom. Pt encouraged to call nurse when they need to use restroom. Pt assisted back into bed and call bell within reach.

## 2022-03-22 NOTE — Plan of Care (Signed)
  Problem: Safety: Goal: Ability to remain free from injury will improve Outcome: Progressing  Bed alarm on   Call bell within reach  Client advised to call

## 2022-03-22 NOTE — Progress Notes (Signed)
PROGRESS NOTE Nathan Tyler  UXN:[id number] DOB: 01/23/1956 DOA: 03/09/2022 PCP: Celene Squibb, MD   Brief Narrative/Hospital Course: 66 year old male with PMH CKD4 s/p transplant, chorea, DM II, HTN, HLD, hypothyroidism who presented with SOB. He was recently been hospitalized from 11/1 - 11/8 as well for CAP and completed antibiotics in the hospital and was discharged home with home health. Seen in the ER, CXR was more concerning for volume overload and he was found to have worsened renal function.  He was evaluated by nephrology and admitted for acute hypoxic respiratory failure, acute metabolic encephalopathy and started on CRRT for fluid overload and acutely worsening renal failure on the CT of CKD stage IV in a transplant kidney.Patient was [facility] from [facility] to [facility] hospital 03/15/22. Followed by nephrology AKI improved back to baseline CK.Mental status improved> IR removed [facility] 11/27.  Patient is delayed fluid overload with dyspnea with activity, leg edema given IV Lasix 02/2016, 11/28 with close monitoring of renal function Once renal function/fluid overload stabilizes planning to discharge to CIR  Subjective: Seen and examined this morning Patient had urine dressing yesterday morning and out cath at 1200 cc output Overnight this am large amount of urine on the floor along with 50 cc of urine in the urinal Denies chest pain shortness of breath leg still appears edematous Labs pending  Assessment and Plan: Principal Problem:   Acute renal failure superimposed on stage 4 chronic kidney disease ([place]) Active Problems:   Acute respiratory failure with hypoxia ([place])   Acute on chronic anemia   DM2 (diabetes mellitus, type 2) (HCC)   Acute renal failure (HCC)   Malnutrition of moderate degree   AKI on CKD stage IV on transplant kidney: Creatinine peaked to 5.9, seen by nephrology. S/p CRRT 11/19-11/21- Right TDC removed 11/27, Foley removed 11/27. UA was negative.  Urine lites consistent with  prerenal AKI, ultrasound normal.  Nephrology following closely.  Still with persistent elevated creatinine edematous leg managing with IV Lasix with close monitoring of renal function intake output.  Labs pending this morning>, subsequently resulted creatinine elevated 4.5 Recent Labs  Lab 03/20/22 0413 03/20/22 1536 03/21/22 0623 03/21/22 1539 03/22/22 0734  BUN 77* 78* 83* 85* 88*  CREATININE 4.26* 4.32* 4.26* 4.42* 4.55*   Intake/Output Summary (Last 24 hours) at 03/22/2022 0916 Last data filed at 03/22/2022 2542 Gross per 24 hour  Intake 800 ml  Output 1502 ml  Net -702 ml  Acute hypoxic respiratory failure due to pulmonary edema: Resolved.  Doing well on room air  Lower leg edema 2+ pitting edema Fluid overload: In the setting of renal failure.  Appreciate nephrology input> s/p lasix iv 11/27 and 11/28> now on 80 mg twice daily,  S/P metolazone. Monitor intake output. Encourage PT OT and mobilize Net IO Since Admission: -7,626.85 mL [03/22/22 0916]   Urine retention needing in and out cath overnight, if needs I/Ox3 or retention more than 400 cc will need Foley catheter.  Check bladder scan postvoid prn-ordered.  Acute metabolic encephalopathy due to sundowning hypoxia uremia renal failure needing Precedex initially.  Mentation at base and currently.  Status post renal transplant 2008 -chronically immunosuppressed on tacrolimus, mycophenolate, and low-dose prednisone.  Continue the same and monitor renal function   Anemia of chronic kidney disease: Hemoglobin stable.Monitor as below and transfuse if less than 7 g Recent Labs  Lab 03/16/22 0143 03/17/22 0204 03/19/22 0416  HGB 7.8* 7.8* 7.9*  HCT 25.0* 24.4* 26.2*    DM2 with poorly  controlled hyperglycemia:HbA1c 5.7.At home on 5 units 3 times daily short-acting, Trulicity weekly.patient on prednisone.  Blood sugar remains controlled on currentsemglee 10 u, ssi and NovoLog 5 units pre-meal Recent Labs  Lab 03/21/22 0714  03/21/22 1143 03/21/22 1530 03/21/22 2148 03/22/22 0852  GLUCAP 193* 107* 201* 219* 155*  HTN: BP controlled on amlodipine 10 Coreg 12.5, hydralazine '50mg'$  tid OIN:OMVEHMCN Lipitor Hypothyroidism: Continue home dose of Synthroid Involuntary movements/chorea-chronic,continue home dose of Austedo Class II Obesity:Patient's Body mass index is 34.71 kg/m. : Will benefit with PCP follow-up, weight loss  healthy lifestyle and outpatient sleep evaluation.  Deconditioning/debility continue PT OT recommending CIR> plan for CIR once renal function is stable  Moderate malnutrition augment diet as below Nutrition Problem: Moderate Malnutrition Etiology: chronic illness (renal transplant 2008 with CKD 4 on chonic immunosuppression) Signs/Symptoms: mild muscle depletion, mild fat depletion Interventions: Liberalize Diet, Refer to RD note for recommendations, MVI, Ensure Enlive (each supplement provides 350kcal and 20 grams of protein)  DVT prophylaxis: heparin injection 5,000 Units Start: 03/09/22 2200 Code Status:   Code Status: DNR Family Communication: plan of care discussed with patient  Patient status is: Inpatient because of acute renal failure Level of care: Med-Surg  Dispo: The patient is from: home w/ wife            Anticipated disposition: CIR once renal function is stable, monitoring at least 24 hours inpatient   Objective: Vitals last 24 hrs: Vitals:   03/21/22 2023 03/22/22 0338 03/22/22 0357 03/22/22 0851  BP: (!) 142/66 136/65  (!) 141/63  Pulse: 95 (!) 59  60  Resp: '18 19  15  '$ Temp: 97.9 F (36.6 C) (!) 97.5 F (36.4 C)  98.2 F (36.8 C)  TempSrc: Oral   Oral  SpO2: 97% 91%  97%  Weight:   112.9 kg   Height:       Weight change: -0.6 kg  Physical Examination: General exam: AAOX3, weak,older appearing HEENT:Oral mucosa moist, Ear/Nose WNL grossly, dentition normal. Respiratory system: bilaterally CLEAR BS, no use of accessory muscle Cardiovascular system: S1 & S2  +, regular rate, JVD NEG. Gastrointestinal system: Abdomen soft, NT,ND,BS+ Nervous System:Alert, awake, moving extremities and grossly nonfocal Extremities: LE ankle edema ++, lower extremities warm Skin: No rashes,no icterus. MSK: Normal muscle bulk,tone, power    Medications reviewed:  Scheduled Meds:  amLODipine  10 mg Oral Daily   atorvastatin  10 mg Oral Daily   carvedilol  12.5 mg Oral BID   Deutetrabenazine  6 mg Oral BID   feeding supplement (GLUCERNA SHAKE)  237 mL Oral BID BM   furosemide  80 mg Intravenous BID   heparin  5,000 Units Subcutaneous Q8H   hydrALAZINE  50 mg Oral TID   insulin aspart  0-9 Units Subcutaneous TID WC   insulin aspart  5 Units Subcutaneous TID WC   insulin glargine-yfgn  10 Units Subcutaneous QHS   levothyroxine  112 mcg Oral Q0600   lipase/protease/amylase  72,000 Units Oral TID WC   melatonin  3 mg Oral QHS   multivitamin  1 tablet Oral QHS   mycophenolate  180 mg Oral BID   predniSONE  5 mg Oral Q breakfast   QUEtiapine  50 mg Oral QHS   senna  1 tablet Oral BID   tacrolimus  1.5 mg Oral BID   Continuous Infusions:    Diet Order             Diet renal/carb modified with fluid restriction Diet-HS  Snack? Nothing; Fluid restriction: 1200 mL Fluid; Room service appropriate? Yes; Fluid consistency: Thin  Diet effective now                  Intake/Output Summary (Last 24 hours) at 03/22/2022 0916 Last data filed at 03/22/2022 1829 Gross per 24 hour  Intake 800 ml  Output 1502 ml  Net -702 ml   Net IO Since Admission: -7,626.85 mL [03/22/22 0916]  Wt Readings from Last 3 Encounters:  03/22/22 112.9 kg  02/22/22 104.3 kg  11/24/21 107 kg     Unresulted Labs (From admission, onward)     Start     Ordered   03/23/22 0500  CBC  Tomorrow morning,   R       Question:  Specimen collection method  Answer:  Lab=Lab collect   03/22/22 0826   03/12/22 0500  Renal function panel (daily at 0500)  Daily at 5am,   R     Question:   Specimen collection method  Answer:  Lab=Lab collect   03/11/22 1626   03/12/22 0500  Magnesium  Daily at 5am,   R     Question:  Specimen collection method  Answer:  Lab=Lab collect   03/11/22 1626   Signed and Held  CBC  (heparin)  Once,   R       Comments: Baseline for heparin therapy IF NOT ALREADY DRAWN.  Notify MD if PLT < 100 K.   Question:  Specimen collection method  Answer:  Lab=Lab collect   Signed and Held   Signed and Held  Creatinine, serum  (heparin)  Once,   R       Comments: Baseline for heparin therapy IF NOT ALREADY DRAWN.   Question:  Specimen collection method  Answer:  Lab=Lab collect   Signed and Held   Signed and Held  Comprehensive metabolic panel  Tomorrow morning,   R       Question:  Specimen collection method  Answer:  Lab=Lab collect   Signed and Held   Signed and Held  CBC with Differential/Platelet  Tomorrow morning,   R       Question:  Specimen collection method  Answer:  Lab=Lab collect   Signed and Held          Data Reviewed: I have personally reviewed following labs and imaging studies CBC: Recent Labs  Lab 03/16/22 0143 03/17/22 0204 03/19/22 0416  WBC 6.1 6.4 6.0  HGB 7.8* 7.8* 7.9*  HCT 25.0* 24.4* 26.2*  MCV 96.5 94.9 97.8  PLT 180 175 937   Basic Metabolic Panel: Recent Labs  Lab 03/18/22 0212 03/18/22 1608 03/19/22 0416 03/19/22 1557 03/20/22 0413 03/20/22 1536 03/21/22 0623 03/21/22 1539 03/22/22 0734  NA 137   < > 138   < > 134* 138 137 139 139  K 4.5   < > 4.5   < > 4.9 5.1 4.5 4.9 4.4  CL 105   < > 107   < > 103 105 105 102 104  CO2 24   < > 25   < > '24 24 23 24 25  '$ GLUCOSE 318*   < > 167*   < > 313* 151* 223* 210* 142*  BUN 63*   < > 67*   < > 77* 78* 83* 85* 88*  CREATININE 3.62*   < > 3.79*   < > 4.26* 4.32* 4.26* 4.42* 4.55*  CALCIUM 7.6*   < > 7.8*   < >  7.7* 8.3* 7.9* 8.4* 8.4*  MG 2.5*  --  2.6*  --  2.6*  --  2.7*  --  2.5*  PHOS 3.6   < > 4.2   < > 4.6 4.3 4.4 3.9 4.4   < > = values in this interval  not displayed.   No results found for this or any previous visit (from the past 240 hour(s)).  Antimicrobials: Anti-infectives (From admission, onward)    Start     Dose/Rate Route Frequency Ordered Stop   03/14/22 1315  ceFAZolin (ANCEF) IVPB 2g/100 mL premix        2 g 200 mL/hr over 30 Minutes Intravenous On call to O.R. 03/14/22 1300 03/14/22 1400   03/11/22 1600  levofloxacin (LEVAQUIN) IVPB 500 mg  Status:  Discontinued        500 mg 100 mL/hr over 60 Minutes Intravenous Every 48 hours 03/09/22 1808 03/11/22 1539   03/09/22 1600  levofloxacin (LEVAQUIN) IVPB 500 mg        500 mg 100 mL/hr over 60 Minutes Intravenous  Once 03/09/22 1551 03/09/22 1730      Culture/Microbiology    Component Value Date/Time   SDES BLOOD LEFT FOREARM 02/21/2022 2236   SPECREQUEST  02/21/2022 2236    BOTTLES DRAWN AEROBIC AND ANAEROBIC Blood Culture adequate volume   CULT  02/21/2022 2236    NO GROWTH 5 DAYS Performed at [facility] [address] [place], [place] [address]    REPTSTATUS 02/26/2022 FINAL 02/21/2022 2236    Radiology Studies: No results found.   LOS: 13 days   Antonieta Pert, MD [facility]  03/22/2022, 9:16 AM

## 2022-03-22 NOTE — Progress Notes (Signed)
Inpatient Rehab Admissions Coordinator:  Pt is not medically stable for CIR admission. Will continue to follow.   Gayland Curry, [place], [place] Admissions Coordinator [phone]

## 2022-03-22 NOTE — Progress Notes (Signed)
[facility] Progress Note  Subjective:  He had 1.5 liters and 3 unmeasured urine voids over 11/29.  He is surprised to hear that his urine output increased over 11/29.  Wants to get medically ready before he is transferred.  Weight is coming down  Review of systems:    Shortness of breath is better and he states he is off of oxygen he denies chest pain  Denies n/v  Vitals:   03/21/22 1528 03/21/22 2023 03/22/22 0338 03/22/22 0357  BP: (!) 165/80 (!) 142/66 136/65   Pulse: 96 95 (!) 59   Resp: _0 Temp: (!) 97.4 F (36.3 C) 97.9 F (36.6 C) (!) 97.5 F (36.4 C)   TempSrc: Oral Oral    SpO2: 96% 97% 91%   Weight:    112.9 kg  Height:        Physical Exam:   General adult male in bed in no acute distress HEENT normocephalic atraumatic extraocular movements intact sclera anicteric Neck supple trachea midline Lungs crackles bilaterally at bases normal work of breathing at rest on room air  Heart S1S2 no rub Abdomen soft nontender nondistended Extremities 2+ edema lower extremities Psych normal mood and affect Neuro - alert and oriented  GU -  no foley       Date                           Creat               eGFR   2017-2018                 1.11- 1.56   2020                          1.88   2021                          1.54- 2.09   Sept 2023                  3.56                 18 ml/min    11/1- 02/28/22           4.38 >> 3.28    AKI on CKD4, 14 >> 20 ml/min    11/17                                    5.90                 10     11/18                                    5.78                 10       UA negative    UNa <10, UCr 151    CXR - IMPRESSION: Very low lung volumes. Bilateral patchy airspace opacities may represent a combination of bronchovascular crowding, pulmonary edema, and possible multifocal infection.       Assessment/ Plan: AKI on CKD 4 transplant - Started CRRT 03/11/22 - 11/21:  Creat here 5.9 on admission  11/17 in the  setting of SOB and recent admission for PNA rx'd w/ abx. Likely ATN in setting of advanced CKD.  UA neg, urine lytes c/w pre-renal.  11/20 transplant renal US normal/ no hydro.  IR consulted for [facility] R IJ placed 11/22; this was later removed.  CRRT stopped on 11/21 Continue supportive care - cr and BUN are slowly uptrending with diuresis Continue Lasix 80 mg IV BID   S/p metolazone 5 mg once on 11/28 strict ins/outs  CKD stage IV of renal allograft - baseline Cr 3.3 - 3.6 (eGFR 18-20) per nephrology charting.  Follows with Dr. Marval Regal Renal transplant - done around 2010, cont prograf, myfortic and pred. Renal US stable.  DM2 - Per primary team  HTN - acceptable on current regimen  Movement disorder - chronic issue, this is not myoclonus but is his baseline "chorea" per the sister. Getting Austedo per primary    Disposition - continue inpatient monitoring; would please hold off on a transfer to CIR today with his increase in creatinine and continued overload (in this renal transplant patient with advanced CKD).     Recent Labs  Lab 03/17/22 0204 03/17/22 1611 03/19/22 0416 03/19/22 1557 03/21/22 0623 03/21/22 1539  HGB 7.8*  --  7.9*  --   --   --   ALBUMIN 2.7*   < > 2.7*   < > 2.9* 3.1*  CALCIUM 7.8*   < > 7.8*   < > 7.9* 8.4*  PHOS 3.2   < > 4.2   < > 4.4 3.9  CREATININE 3.69*   < > 3.79*   < > 4.26* 4.42*  K 4.2   < > 4.5   < > 4.5 4.9   < > = values in this interval not displayed.   No results for input(s): "IRON", "TIBC", "FERRITIN" in the last 168 hours. Inpatient medications:  amLODipine  10 mg Oral Daily   atorvastatin  10 mg Oral Daily   carvedilol  12.5 mg Oral BID   Deutetrabenazine  6 mg Oral BID   feeding supplement (GLUCERNA SHAKE)  237 mL Oral BID BM   furosemide  80 mg Intravenous BID   heparin  5,000 Units Subcutaneous Q8H   hydrALAZINE  50 mg Oral TID   insulin aspart  0-9 Units Subcutaneous TID WC   insulin aspart  5 Units Subcutaneous TID WC   insulin  glargine-yfgn  10 Units Subcutaneous QHS   levothyroxine  112 mcg Oral Q0600   lipase/protease/amylase  72,000 Units Oral TID WC   melatonin  3 mg Oral QHS   multivitamin  1 tablet Oral QHS   mycophenolate  180 mg Oral BID   predniSONE  5 mg Oral Q breakfast   QUEtiapine  50 mg Oral QHS   senna  1 tablet Oral BID   tacrolimus  1.5 mg Oral BID     acetaminophen, albuterol, hydrALAZINE, naphazoline-glycerin, ondansetron **OR** ondansetron (ZOFRAN) IV, polyethylene glycol   Claudia Desanctis, MD 8:41 AM 03/22/2022

## 2022-03-23 DIAGNOSIS — N184 Chronic kidney disease, stage 4 (severe): Secondary | ICD-10-CM | POA: Diagnosis not present

## 2022-03-23 DIAGNOSIS — N179 Acute kidney failure, unspecified: Secondary | ICD-10-CM | POA: Diagnosis not present

## 2022-03-23 LAB — GLUCOSE, CAPILLARY
Glucose-Capillary: 156 mg/dL — ABNORMAL HIGH (ref 70–99)
Glucose-Capillary: 73 mg/dL (ref 70–99)
Glucose-Capillary: 222 mg/dL — ABNORMAL HIGH (ref 70–99)
Glucose-Capillary: 156 mg/dL — ABNORMAL HIGH (ref 70–99)

## 2022-03-23 LAB — RENAL FUNCTION PANEL
Albumin: 2.9 g/dL — ABNORMAL LOW (ref 3.5–5.0)
Anion gap: 10 (ref 5–15)
BUN: 95 mg/dL — ABNORMAL HIGH (ref 8–23)
CO2: 24 mmol/L (ref 22–32)
Calcium: 8.2 mg/dL — ABNORMAL LOW (ref 8.9–10.3)
Chloride: 105 mmol/L (ref 98–111)
Creatinine, Ser: 4.54 mg/dL — ABNORMAL HIGH (ref 0.61–1.24)
GFR, Estimated: 13 mL/min — ABNORMAL LOW (ref >60)
Glucose, Bld: 186 mg/dL — ABNORMAL HIGH (ref 70–99)
Phosphorus: 5 mg/dL — ABNORMAL HIGH (ref 2.5–4.6)
Potassium: 4.4 mmol/L (ref 3.5–5.1)
Sodium: 139 mmol/L (ref 135–145)

## 2022-03-23 LAB — CBC
HCT: 24.7 % — ABNORMAL LOW (ref 39.0–52.0)
Hemoglobin: 7.7 g/dL — ABNORMAL LOW (ref 13.0–17.0)
MCH: 29.7 pg (ref 26.0–34.0)
MCHC: 31.2 g/dL (ref 30.0–36.0)
MCV: 95.4 fL (ref 80.0–100.0)
Platelets: 218 10*3/uL (ref 150–400)
RBC: 2.59 MIL/uL — ABNORMAL LOW (ref 4.22–5.81)
RDW: 14.7 % (ref 11.5–15.5)
WBC: 5.8 10*3/uL (ref 4.0–10.5)
nRBC: 0 % (ref 0.0–0.2)

## 2022-03-23 LAB — MAGNESIUM: Magnesium: 2.4 mg/dL (ref 1.7–2.4)

## 2022-03-23 MED ORDER — PANTOPRAZOLE SODIUM 40 MG PO TBEC
40.0000 mg | DELAYED_RELEASE_TABLET | Freq: Every day | ORAL | Status: DC
  Administered 2022-03-23 – 2022-03-27 (×5): 40 mg via ORAL
  Filled 2022-03-23 (×5): qty 1

## 2022-03-23 MED ORDER — METOLAZONE 5 MG PO TABS
5.0000 mg | ORAL_TABLET | Freq: Once | ORAL | Status: AC
  Administered 2022-03-23: 5 mg via ORAL
  Filled 2022-03-23: qty 1

## 2022-03-23 MED ORDER — INSULIN GLARGINE-YFGN 100 UNIT/ML ~~LOC~~ SOLN
12.0000 [IU] | Freq: Every day | SUBCUTANEOUS | Status: DC
  Administered 2022-03-23 – 2022-03-26 (×4): 12 [IU] via SUBCUTANEOUS
  Filled 2022-03-23 (×5): qty 0.12

## 2022-03-23 NOTE — Consult Note (Signed)
   [facility] Inpatient Consult   03/23/2022  Nathan Tyler [birth year] [id number]  [facility] [ACO] Patient: Medicare ACO REACH  Primary Care Provider:  Celene Squibb, MD  Hospital referral:  [facility] RNCM  Patient evaluated for community based care coordination management services with [place] [facility] as a benefit of patient's Medicare Insurance. Went by the patient's room to explained that the patient qualifies for care coordination as a benefit. 1510: Came by patient's room. However, patient was fast asleep in NAD noted. Left an appointment reminder card on bedside table with 24 hour nurse advise line. Review as patient LLOS 14 days with less than 30 days readmission was for Inpatient Rehab but progressed to home with Home Health.    Plan: Patient will receive post hospital discharge call and will be evaluated for ongoing community care coordination needs. Will continue to follow and make appropriate referral as patient may transition over the weekend.    Of note, [facility] Care Management services does not replace or interfere with any services that are arranged by inpatient case management or social work.  For additional questions or referrals please contact:    Natividad Brood, RN BSN [place]  [phone] business mobile phone Toll free office [phone]  *[place]  [phone] Fax number: [phone] [email or web address]

## 2022-03-23 NOTE — Progress Notes (Signed)
[facility] Progress Note  Subjective:  He had 1.9 liters and 6 additional unmeasured urine voids over 11/30 per charting.  He states legs were never wrapped yesterday.  He didn't get weighed this AM.  Spoke with day shift RN and she is going to get him weighed and apply the compression wraps.  We have discussed that though there is not a need for urgent dialysis that we are near that point.  He would want dialysis if needed (and got CRRT earlier this admission) but hopes to avoid if he can   Review of systems:   Shortness of breath is better he denies chest pain  Denies n/v  Vitals:   03/22/22 1931 03/23/22 0425 03/23/22 0500 03/23/22 0814  BP: (!) 151/65 138/72 (!) 153/60 (!) 148/67  Pulse: 69 (!) 56 (!) 57 63  Resp: _0 Temp: 98 F (36.7 C) 97.7 F (36.5 C) 97.8 F (36.6 C) 97.6 F (36.4 C)  TempSrc: Oral Oral Oral Oral  SpO2: 95% 97% 93% 96%  Weight:      Height:        Physical Exam:    General adult male in bed in no acute distress HEENT normocephalic atraumatic extraocular movements intact sclera anicteric Neck supple trachea midline Lungs crackles bilaterally at bases normal work of breathing at rest on room air  Heart S1S2 no rub Abdomen soft nontender nondistended Extremities 2-3+ edema lower extremities Psych normal mood and affect Neuro - alert and oriented  GU -  no foley       Date                           Creat               eGFR   2017-2018                 1.11- 1.56   2020                          1.88   2021                          1.54- 2.09   Sept 2023                  3.56                 18 ml/min    11/1- 02/28/22           4.38 >> 3.28    AKI on CKD4, 14 >> 20 ml/min    11/17                                    5.90                 10     11/18                                    5.78                 10       UA negative    UNa <10, UCr 151    CXR - IMPRESSION: Very low  lung volumes. Bilateral patchy airspace  opacities may represent a combination of bronchovascular crowding, pulmonary edema, and possible multifocal infection.       Assessment/ Plan: 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 Likely ATN in setting of advanced CKD.  UA neg, urine lytes c/w pre-renal.  11/20 transplant renal US normal/ no hydro.  IR consulted for [facility], R IJ placed 11/22; this was later removed.  CRRT stopped on 11/21 Continue supportive care - cr and BUN are slowly uptrending with diuresis Continue Lasix 80 mg IV BID   Give metolazone 5 mg once again today strict ins/outs  Daily weights  Asked RN to wrap his legs  I have discussed with the patient that if no improvement he may need dialysis by early next week and he voices understanding and agreement CKD stage IV of renal allograft - baseline Cr 3.3 - 3.6 (eGFR 18-20) per nephrology charting.  Follows with Dr. Marval Regal Renal transplant - done around 2010, cont prograf, myfortic and pred. Renal US stable.  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 Getting [place] per primary    Disposition - continue inpatient monitoring.  Not appropriate for transfer to rehab at this time.  We are monitoring for the need for dialysis    Recent Labs  Lab 03/19/22 0416 03/19/22 1557 03/22/22 0734 03/23/22 0550  HGB 7.9*  --   --  7.7*  ALBUMIN 2.7*   < > 3.0* 2.9*  CALCIUM 7.8*   < > 8.4* 8.2*  PHOS 4.2   < > 4.4 5.0*  CREATININE 3.79*   < > 4.55* 4.54*  K 4.5   < > 4.4 4.4   < > = values in this interval not displayed.   No results for input(s): "IRON", "TIBC", "FERRITIN" in the last 168 hours. Inpatient medications:  amLODipine  10 mg Oral Daily   atorvastatin  10 mg Oral Daily   carvedilol  12.5 mg Oral BID    Deutetrabenazine  6 mg Oral BID   feeding supplement (GLUCERNA SHAKE)  237 mL Oral BID BM   furosemide  80 mg Intravenous BID   heparin  5,000 Units Subcutaneous Q8H   hydrALAZINE  50 mg Oral TID   insulin aspart  0-9 Units Subcutaneous TID WC   insulin aspart  5 Units Subcutaneous TID WC   insulin glargine-yfgn  12 Units Subcutaneous QHS   levothyroxine  112 mcg Oral Q0600   lipase/protease/amylase  72,000 Units Oral TID WC   melatonin  3 mg Oral QHS   multivitamin  1 tablet Oral QHS   mycophenolate  180 mg Oral BID   predniSONE  5 mg Oral Q breakfast   QUEtiapine  50 mg Oral QHS   senna  1 tablet Oral BID   tacrolimus  1.5 mg Oral BID     acetaminophen, albuterol, hydrALAZINE, naphazoline-glycerin, ondansetron **OR** ondansetron (ZOFRAN) IV, polyethylene glycol   Claudia Desanctis, MD 9:29 AM 03/23/2022

## 2022-03-23 NOTE — Plan of Care (Signed)

## 2022-03-23 NOTE — Progress Notes (Signed)
Occupational Therapy Treatment Patient Details Name: Nathan Tyler MRN: [id number] DOB: 08/22/1955 Today's Date: 03/23/2022   History of present illness 66 y.o. male admitted to [facility] 11/17 with dyspnea, weakness, respiratory failure and acute worsening renal failure. CRRT 11/19-11/21. Transferred to [facility] 11/23.  PMhx: renal transplant with CKD, T2DM, HTN, hypothyroidism,  movement disorder, recent admission 11/1-11/8/23 for CAP   OT comments  Patient continues to make steady progress towards goals in skilled OT session. Patient's session encompassed ADLs and functional mobility. Patient with improved cognition, ability to complete self care tasks, and functional ambulation to complete household tasks in session to date. OT following up with patient with conversation initiated by PT this morning with regard to AIR vs [place] rehab. Patient remains in agreement with change to [facility] rehab therefore recommendation upgraded. OT will continue to follow acutely.    Recommendations for follow up therapy are one component of a multi-disciplinary discharge planning process, led by the attending physician.  Recommendations may be updated based on patient status, additional functional criteria and insurance authorization.    Follow Up Recommendations  Home health OT     Assistance Recommended at Discharge Frequent or constant Supervision/Assistance  Patient can return home with the following  A little help with walking and/or transfers;Assistance with cooking/housework;Direct supervision/assist for medications management;Direct supervision/assist for financial management;Assist for transportation;Help with stairs or ramp for entrance;A little help with bathing/dressing/bathroom   Equipment Recommendations  None recommended by OT    Recommendations for Other Services      Precautions / Restrictions Precautions Precautions: Fall Restrictions Weight Bearing Restrictions: No       Mobility Bed Mobility                General bed mobility comments: OOB in recliner    Transfers Overall transfer level: Needs assistance Equipment used: None Transfers: Sit to/from Stand Sit to Stand: Min guard           General transfer comment: min guard to stand and for transfers with cues for safety     Balance Overall balance assessment: Needs assistance Sitting-balance support: No upper extremity supported, Feet supported Sitting balance-Leahy Scale: Good     Standing balance support: No upper extremity supported, During functional activity Standing balance-Leahy Scale: Fair Standing balance comment: able to stand at sink for self care and ambulate around room                           ADL either performed or assessed with clinical judgement   ADL Overall ADL's : Needs assistance/impaired     Grooming: Wash/dry hands;Wash/dry face;Oral care;Supervision/safety Grooming Details (indicate cue type and reason): stood at sink with supervision, no cues needed                 Toilet Transfer: Min guard;Ambulation Toilet Transfer Details (indicate cue type and reason): simulated with movement in room with recliner to complete ADL tasks         Functional mobility during ADLs: Min guard General ADL Comments: patient continues to improve, recommendation now upgraded to [facility]    Extremity/Trunk Assessment              Vision       Perception     Praxis      Cognition Arousal/Alertness: Awake/alert Behavior During Therapy: Impulsive Overall Cognitive Status: Impaired/Different from baseline Area of Impairment: Awareness, Attention, Safety/judgement  Current Attention Level: Sustained     Safety/Judgement: Decreased awareness of safety Awareness: Intellectual   General Comments: improved sequencing and overall attention, however remains minimally impulsive        Exercises      Shoulder Instructions       General  Comments      Pertinent Vitals/ Pain       Pain Assessment Pain Assessment: No/denies pain  Home Living                                          Prior Functioning/Environment              Frequency  Min 2X/week        Progress Toward Goals  OT Goals(current goals can now be found in the care plan section)  Progress towards OT goals: Progressing toward goals  Acute Rehab OT Goals Patient Stated Goal: go home OT Goal Formulation: With patient Time For Goal Achievement: 03/30/22 Potential to Achieve Goals: Good  Plan Discharge plan needs to be updated    Co-evaluation                 AM-PAC OT "6 Clicks" Daily Activity     Outcome Measure   Help from another person eating meals?: None Help from another person taking care of personal grooming?: A Little Help from another person toileting, which includes using toliet, bedpan, or urinal?: A Little Help from another person bathing (including washing, rinsing, drying)?: A Little Help from another person to put on and taking off regular upper body clothing?: None Help from another person to put on and taking off regular lower body clothing?: A Little 6 Click Score: 20    End of Session    OT Visit Diagnosis: Unsteadiness on feet (R26.81);Muscle weakness (generalized) (M62.81);Other symptoms and signs involving cognitive function   Activity Tolerance Patient tolerated treatment well   Patient Left in chair;with call bell/phone within reach;with chair alarm set   Nurse Communication Mobility status        Time: 4481-8563 OT Time Calculation (min): 9 min  Charges: OT General Charges $OT Visit: 1 Visit OT Treatments $Self Care/Home Management : 8-22 mins  Nathan Tyler, OTR/L Acute Rehabilitation Services [phone]   Nathan Tyler 03/23/2022, 11:37 AM

## 2022-03-23 NOTE — TOC Progression Note (Signed)
Transition of Care [facility] - Progression Note    Patient Details  Name: Nathan Tyler MRN: [id number] Date of Birth: 1956/03/07  Transition of Care [facility]) CM/SW [place], RN Phone Number: 03/23/2022, 2:57 PM  Clinical Narrative:    CM met with the patient at the bedside to discuss transitions of care to home.  The patient has declined CIR placement at this time and wants to go home with home health services.  DME at home include WC and RW, and glucometer.  The patient is currently on room air.  The patient was given Medicare choice regarding home health services and he states that he prefers [place].  I called [facility] and set him up with PT/OT.  Personal care services are currently active with private pay services through [employer] per the patient.  The patient states that both him and his wife drive.   The patient states that his wife will be providing transportation to home by car.   Expected Discharge Plan: [place] Barriers to Discharge: Continued Medical Work up  Expected Discharge Plan and Services Expected Discharge Plan: [place] In-house Referral: Clinical Social Work Discharge Planning Services: CM Consult Post Acute Care Choice: [place] arrangements for the past 2 months: [place]: [place] Date [place]: 03/23/22 Time [place]: [place] Representative spoke with at [place]: Tommi Rumps, [place] with [place] ([place]) Interventions    Readmission Risk Interventions    03/23/2022    2:53 PM 03/14/2022    9:17 AM 02/28/2022   10:24 AM  Readmission Risk Prevention Plan  Transportation Screening Complete Complete Complete  PCP or Specialist Appt within 5-7 Days   Complete  Home Care Screening   Complete  Medication Review (RN CM)   Complete  Medication Review (RN Care Manager) Complete  Complete   PCP or Specialist appointment within 3-5 days of discharge Complete    HRI or [place] Complete Complete   SW Recovery Care/Counseling Consult Complete Complete   Palliative Care Screening Not Applicable Not [place] Complete Not Applicable

## 2022-03-23 NOTE — Progress Notes (Signed)
PROGRESS NOTE Nathan Tyler  VCB:[id number] DOB: 03/29/1956 DOA: 03/09/2022 PCP: Celene Squibb, MD   Brief Narrative/Hospital Course: 66 year old male with PMH CKD4 s/p transplant, chorea, DM II, HTN, HLD, hypothyroidism who presented with SOB. He was recently been hospitalized from 11/1 - 11/8 as well for CAP and completed antibiotics in the hospital and was discharged home with home health. Seen in the ER, CXR was more concerning for volume overload and he was found to have worsened renal function.  He was evaluated by nephrology and admitted for acute hypoxic respiratory failure, acute metabolic encephalopathy and started on CRRT for fluid overload and acutely worsening renal failure on the CT of CKD stage IV in a transplant kidney.Patient was transferred from [facility] to [facility] hospital 03/15/22. Followed by nephrology AKI improved back to baseline CK.Mental status improved> IR removed [facility] 11/27.  Patient is delayed fluid overload with dyspnea with activity, leg edema given IV Lasix 02/2016, 11/28 with close monitoring of renal function Once renal function/fluid overload stabilizes planning to discharge to CIR  Subjective:  Seen and examined.   Resting comfortably.   Reports he has been voiding multiple times no retention Legs still appears edematous No new complaints  Assessment and Plan: Principal Problem:   Acute renal failure superimposed on stage 4 chronic kidney disease (HCC) Active Problems:   Acute respiratory failure with hypoxia (HCC)   Acute on chronic anemia   DM2 (diabetes mellitus, type 2) (HCC)   Acute renal failure (HCC)   Malnutrition of moderate degree   AKI on CKD stage IV on transplant kidney: UA was negative.  Urine lites consistent with prerenal AKI, ultrasound normal. Creatinine peaked to 5.9-S/p CRRT 11/19-11/21-> Right TDC and foley removed 11/27. Nephrology following closely> being managed with IV Lasix for fluid overload and monitor renal function closely, patient  voiding without retention.  Creatinine holding in mid 4.5 range Recent Labs  Lab 03/20/22 1536 03/21/22 0623 03/21/22 1539 03/22/22 0734 03/23/22 0550  BUN 78* 83* 85* 88* 95*  CREATININE 4.32* 4.26* 4.42* 4.55* 4.54*    Intake/Output Summary (Last 24 hours) at 03/23/2022 3846 Last data filed at 03/23/2022 0100 Gross per 24 hour  Intake 400 ml  Output 1900 ml  Net -1500 ml   Acute hypoxic respiratory failure due to pulmonary edema: Resolved.  Doing well on room air  Lower leg edema 2+ pitting edema Fluid overload: Due to CKD, still with significant edematous leg, continue Lasix 80 mg twice daily, intermittent metolazone, as per nephrology with close monitoring renal function,intake output. Encourage PT OT and mobilize Net IO Since Admission: -9,126.85 mL [03/23/22 0807]   Urine retention needing in and out cath x1> reports he has been voiding well, check bladder scan postvoid PRN  Acute metabolic encephalopathy 2/2 sundowning hypoxia uremia renal failure,was on Precedex initially.  Symptoms resolved and at baseline currently  Status post renal transplant 2008 -chronically immunosuppressed on tacrolimus, mycophenolate, and low-dose prednisone.  Monitor closely  Anemia of chronic kidney disease: Hemoglobin stable in 7 to 8 g range, transfuse if less than 7 Recent Labs  Lab 03/17/22 0204 03/19/22 0416 03/23/22 0550  HGB 7.8* 7.9* 7.7*  HCT 24.4* 26.2* 24.7*     DM2 with poorly controlled hyperglycemia:HbA1c 5.7.At home on 5 units 3 times daily short-acting, Trulicity weekly.patient on prednisone.  Blood sugar at times poorly controlled over 200 increase Semglee 10 > 12u, cont ssi and NovoLog 5 units pre-meal Recent Labs  Lab 03/21/22 2148 03/22/22 0852 03/22/22 1134 03/22/22  1532 03/22/22 2210  GLUCAP 219* 155* 125* 251* 278*   HTN: BP fairly controlled on amlodipine 10 Coreg 12.5, hydralazine '50mg'$  tid FUX:NATFTDDU Lipitor Hypothyroidism: Continue home dose of  Synthroid Involuntary movements/chorea-chronic,continue home dose of Austedo Class II Obesity:Patient's Body mass index is 34.71 kg/m. : Will benefit with PCP follow-up, weight loss  healthy lifestyle and outpatient sleep evaluation.  Deconditioning/debility continue PT OT recommending CIR> plan for CIR once renal function is stable  Moderate malnutrition augment diet as below Nutrition Problem: Moderate Malnutrition Etiology: chronic illness (renal transplant 2008 with CKD 4 on chonic immunosuppression) Signs/Symptoms: mild muscle depletion, mild fat depletion Interventions: Liberalize Diet, Refer to RD note for recommendations, MVI, Ensure Enlive (each supplement provides 350kcal and 20 grams of protein)  DVT prophylaxis: heparin injection 5,000 Units Start: 03/09/22 2200 Code Status:   Code Status: DNR Family Communication: plan of care discussed with patient  Patient status is: Inpatient because of acute renal failure Level of care: Med-Surg  Dispo: The patient is from: home w/ wife            Anticipated disposition: Remains in house for fluid overload, plan is for CIR once improves and okay with nephrology   Objective: Vitals last 24 hrs: Vitals:   03/22/22 1533 03/22/22 1931 03/23/22 0425 03/23/22 0500  BP: (!) 126/44 (!) 151/65 138/72 (!) 153/60  Pulse: 61 69 (!) 56 (!) 57  Resp: '15 16 16 16  '$ Temp: 98.2 F (36.8 C) 98 F (36.7 C) 97.7 F (36.5 C) 97.8 F (36.6 C)  TempSrc: Oral Oral Oral Oral  SpO2: 93% 95% 97% 93%  Weight:      Height:       Weight change:   Physical Examination: General exam: AAOX3, weak,older appearing HEENT:Oral mucosa moist, Ear/Nose WNL grossly, dentition normal. Respiratory system: bilaterally CLEAR BS, no use of accessory muscle Cardiovascular system: S1 & S2 +, regular rate, JVD neg. Gastrointestinal system: Abdomen soft, NT,ND,BS+ Nervous System:Alert, awake, moving extremities and grossly nonfocal Extremities: LE ankle edema +, lower  extremities warm Skin: No rashes,no icterus. MSK: Normal muscle bulk,tone, power    Medications reviewed:  Scheduled Meds:  amLODipine  10 mg Oral Daily   atorvastatin  10 mg Oral Daily   carvedilol  12.5 mg Oral BID   Deutetrabenazine  6 mg Oral BID   feeding supplement (GLUCERNA SHAKE)  237 mL Oral BID BM   furosemide  80 mg Intravenous BID   heparin  5,000 Units Subcutaneous Q8H   hydrALAZINE  50 mg Oral TID   insulin aspart  0-9 Units Subcutaneous TID WC   insulin aspart  5 Units Subcutaneous TID WC   insulin glargine-yfgn  10 Units Subcutaneous QHS   levothyroxine  112 mcg Oral Q0600   lipase/protease/amylase  72,000 Units Oral TID WC   melatonin  3 mg Oral QHS   multivitamin  1 tablet Oral QHS   mycophenolate  180 mg Oral BID   predniSONE  5 mg Oral Q breakfast   QUEtiapine  50 mg Oral QHS   senna  1 tablet Oral BID   tacrolimus  1.5 mg Oral BID   Continuous Infusions:    Diet Order             Diet renal/carb modified with fluid restriction Diet-HS Snack? Nothing; Fluid restriction: 1200 mL Fluid; Room service appropriate? Yes; Fluid consistency: Thin  Diet effective now  Intake/Output Summary (Last 24 hours) at 03/23/2022 0240 Last data filed at 03/23/2022 0100 Gross per 24 hour  Intake 400 ml  Output 1900 ml  Net -1500 ml    Net IO Since Admission: -9,126.85 mL [03/23/22 0807]  Wt Readings from Last 3 Encounters:  03/22/22 112.9 kg  02/22/22 104.3 kg  11/24/21 107 kg     Unresulted Labs (From admission, onward)     Start     Ordered   03/12/22 0500  Renal function panel (daily at 0500)  Daily at 5am,   R     Question:  Specimen collection method  Answer:  Lab=Lab collect   03/11/22 1626   03/12/22 0500  Magnesium  Daily at 5am,   R     Question:  Specimen collection method  Answer:  Lab=Lab collect   03/11/22 1626   Signed and Held  CBC  (heparin)  Once,   R       Comments: Baseline for heparin therapy IF NOT ALREADY DRAWN.   Notify MD if PLT < 100 K.   Question:  Specimen collection method  Answer:  Lab=Lab collect   Signed and Held   Signed and Held  Creatinine, serum  (heparin)  Once,   R       Comments: Baseline for heparin therapy IF NOT ALREADY DRAWN.   Question:  Specimen collection method  Answer:  Lab=Lab collect   Signed and Held   Signed and Held  Comprehensive metabolic panel  Tomorrow morning,   R       Question:  Specimen collection method  Answer:  Lab=Lab collect   Signed and Held   Signed and Held  CBC with Differential/Platelet  Tomorrow morning,   R       Question:  Specimen collection method  Answer:  Lab=Lab collect   Signed and Held          Data Reviewed: I have personally reviewed following labs and imaging studies CBC: Recent Labs  Lab 03/17/22 0204 03/19/22 0416 03/23/22 0550  WBC 6.4 6.0 5.8  HGB 7.8* 7.9* 7.7*  HCT 24.4* 26.2* 24.7*  MCV 94.9 97.8 95.4  PLT 175 182 973    Basic Metabolic Panel: Recent Labs  Lab 03/19/22 0416 03/19/22 1557 03/20/22 0413 03/20/22 1536 03/21/22 0623 03/21/22 1539 03/22/22 0734 03/23/22 0550  NA 138   < > 134* 138 137 139 139 139  K 4.5   < > 4.9 5.1 4.5 4.9 4.4 4.4  CL 107   < > 103 105 105 102 104 105  CO2 25   < > '24 24 23 24 25 24  '$ GLUCOSE 167*   < > 313* 151* 223* 210* 142* 186*  BUN 67*   < > 77* 78* 83* 85* 88* 95*  CREATININE 3.79*   < > 4.26* 4.32* 4.26* 4.42* 4.55* 4.54*  CALCIUM 7.8*   < > 7.7* 8.3* 7.9* 8.4* 8.4* 8.2*  MG 2.6*  --  2.6*  --  2.7*  --  2.5* 2.4  PHOS 4.2   < > 4.6 4.3 4.4 3.9 4.4 5.0*   < > = values in this interval not displayed.    No results found for this or any previous visit (from the past 240 hour(s)).  Antimicrobials: Anti-infectives (From admission, onward)    Start     Dose/Rate Route Frequency Ordered Stop   03/14/22 1315  ceFAZolin (ANCEF) IVPB 2g/100 mL premix  2 g 200 mL/hr over 30 Minutes Intravenous On call to O.R. 03/14/22 1300 03/14/22 1400   03/11/22 1600   levofloxacin (LEVAQUIN) IVPB 500 mg  Status:  Discontinued        500 mg 100 mL/hr over 60 Minutes Intravenous Every 48 hours 03/09/22 1808 03/11/22 1539   03/09/22 1600  levofloxacin (LEVAQUIN) IVPB 500 mg        500 mg 100 mL/hr over 60 Minutes Intravenous  Once 03/09/22 1551 03/09/22 1730      Culture/Microbiology    Component Value Date/Time   SDES BLOOD LEFT FOREARM 02/21/2022 2236   SPECREQUEST  02/21/2022 2236    BOTTLES DRAWN AEROBIC AND ANAEROBIC Blood Culture adequate volume   CULT  02/21/2022 2236    NO GROWTH 5 DAYS Performed at [facility] [address], [place], [place] [address]    REPTSTATUS 02/26/2022 FINAL 02/21/2022 2236    Radiology Studies: No results found.   LOS: 14 days   Antonieta Pert, MD [facility]  03/23/2022, 8:07 AM

## 2022-03-23 NOTE — Progress Notes (Signed)
Physical Therapy Treatment Patient Details Name: Nathan Tyler MRN: [id number] DOB: Sep 17, 1955 Today's Date: 03/23/2022   History of Present Illness 66 y.o. male admitted to [facility]/17 with dyspnea, weakness, respiratory failure and acute worsening renal failure. CRRT 11/19-11/21. Transferred to [facility] 11/23.  PMhx: renal transplant with CKD, T2DM, HTN, hypothyroidism,  movement disorder, recent admission 11/1-11/8/23 for CAP    PT Comments    Pt pleasant and standing in room on arrival. Pt reports he feels strength and function are nearly back to baseline. Pt able to walk without AD during session with noted choreatic movements with all mobility which is patient's baseline. Pt needs assist to perform stairs without railing and educated for need for railing installation prior to return home. Pt able to perform repeated sit to stands without UE but need to continue to work on controlled descent. Pt preference for return home and this is feasible with HHPT.  Pt with SPO2 94% on RA   Recommendations for follow up therapy are one component of a multi-disciplinary discharge planning process, led by the attending physician.  Recommendations may be updated based on patient status, additional functional criteria and insurance authorization.  Follow Up Recommendations  Home health PT Can patient physically be transported by private vehicle: Yes   Assistance Recommended at Discharge Intermittent Supervision/Assistance  Patient can return home with the following Assistance with cooking/housework;Direct supervision/assist for medications management;Assist for transportation;Direct supervision/assist for financial management   Equipment Recommendations  None recommended by PT    Recommendations for Other Services       Precautions / Restrictions Precautions Precautions: Fall Restrictions Weight Bearing Restrictions: No     Mobility  Bed Mobility               General bed mobility  comments: standing on arrival    Transfers Overall transfer level: Modified independent     Sit to Stand: Supervision           General transfer comment: pt able to stand from chair x 10 trials without use of hands    Ambulation/Gait Ambulation/Gait assistance: Min guard Gait Distance (Feet): 500 Feet Assistive device: None Gait Pattern/deviations: Step-through pattern, Decreased stride length, Wide base of support   Gait velocity interpretation: >2.62 ft/sec, indicative of community ambulatory   General Gait Details: pt with increased BOS and chorea of trunk and UE noted with gait howeve without LOB. pt needing cues to return to correct room   Stairs Stairs: Yes Stairs assistance: Min guard, Min assist Stair Management: Step to pattern, Forwards, One rail Right Number of Stairs: 11 General stair comments: pt attempted to perform stairs without railing but required min assist for balance and stability. Pt informed of need to have hand rail installed for entry into home. Flight of stairs with rail with step to pattern and hard landing on RLE with descent   Wheelchair Mobility    Modified Rankin (Stroke Patients Only)       Balance Overall balance assessment: Needs assistance Sitting-balance support: No upper extremity supported, Feet supported Sitting balance-Leahy Scale: Good     Standing balance support: No upper extremity supported, During functional activity Standing balance-Leahy Scale: Good Standing balance comment: pt able to stand in room and walk without AD                            Cognition Arousal/Alertness: Awake/alert Behavior During Therapy: WFL for tasks assessed/performed Overall Cognitive Status: Impaired/Different from  baseline Area of Impairment: Safety/judgement                         Safety/Judgement: Decreased awareness of safety     General Comments: pt standing in room after goint to toilet without regard to  needing to measure output, slight impulsivity        Exercises      General Comments        Pertinent Vitals/Pain Pain Assessment Pain Assessment: No/denies pain    Home Living                          Prior Function            PT Goals (current goals can now be found in the care plan section) Progress towards PT goals: Progressing toward goals    Frequency    Min 3X/week      PT Plan Discharge plan needs to be updated    Co-evaluation              AM-PAC PT "6 Clicks" Mobility   Outcome Measure  Help needed turning from your back to your side while in a flat bed without using bedrails?: None Help needed moving from lying on your back to sitting on the side of a flat bed without using bedrails?: None Help needed moving to and from a bed to a chair (including a wheelchair)?: A Little Help needed standing up from a chair using your arms (e.g., wheelchair or bedside chair)?: A Little Help needed to walk in hospital room?: A Little Help needed climbing 3-5 steps with a railing? : A Little 6 Click Score: 20    End of Session Equipment Utilized During Treatment: Gait belt Activity Tolerance: Patient tolerated treatment well Patient left: in chair;with call bell/phone within reach;with chair alarm set Nurse Communication: Mobility status PT Visit Diagnosis: Unsteadiness on feet (R26.81);Muscle weakness (generalized) (M62.81)     Time: 7673-4193 PT Time Calculation (min) (ACUTE ONLY): 17 min  Charges:  $Gait Training: 8-22 mins                     Bayard Males, PT Acute Rehabilitation Services Office: [place] 03/23/2022, 11:43 AM

## 2022-03-24 DIAGNOSIS — N179 Acute kidney failure, unspecified: Secondary | ICD-10-CM | POA: Diagnosis not present

## 2022-03-24 DIAGNOSIS — N184 Chronic kidney disease, stage 4 (severe): Secondary | ICD-10-CM | POA: Diagnosis not present

## 2022-03-24 LAB — RENAL FUNCTION PANEL
Albumin: 2.9 g/dL — ABNORMAL LOW (ref 3.5–5.0)
Anion gap: 10 (ref 5–15)
BUN: 92 mg/dL — ABNORMAL HIGH (ref 8–23)
CO2: 27 mmol/L (ref 22–32)
Calcium: 8.4 mg/dL — ABNORMAL LOW (ref 8.9–10.3)
Chloride: 103 mmol/L (ref 98–111)
Creatinine, Ser: 4.45 mg/dL — ABNORMAL HIGH (ref 0.61–1.24)
GFR, Estimated: 14 mL/min — ABNORMAL LOW (ref >60)
Glucose, Bld: 161 mg/dL — ABNORMAL HIGH (ref 70–99)
Phosphorus: 4.9 mg/dL — ABNORMAL HIGH (ref 2.5–4.6)
Potassium: 4.3 mmol/L (ref 3.5–5.1)
Sodium: 140 mmol/L (ref 135–145)

## 2022-03-24 LAB — GLUCOSE, CAPILLARY
Glucose-Capillary: 108 mg/dL — ABNORMAL HIGH (ref 70–99)
Glucose-Capillary: 133 mg/dL — ABNORMAL HIGH (ref 70–99)
Glucose-Capillary: 98 mg/dL (ref 70–99)
Glucose-Capillary: 138 mg/dL — ABNORMAL HIGH (ref 70–99)

## 2022-03-24 LAB — MAGNESIUM: Magnesium: 2.5 mg/dL — ABNORMAL HIGH (ref 1.7–2.4)

## 2022-03-24 MED ORDER — METOLAZONE 5 MG PO TABS
5.0000 mg | ORAL_TABLET | Freq: Once | ORAL | Status: AC
  Administered 2022-03-24: 5 mg via ORAL
  Filled 2022-03-24: qty 1

## 2022-03-24 NOTE — Progress Notes (Signed)
[facility] Progress Note  Subjective:  He had 600 mL UOP over 12/1 per charting.  Not weighed since 11/30.  He would want dialysis if needed (and got CRRT earlier this admission) but hopes to avoid if he can.  Spoke with his sister at bedside.  The patient states that he urinated quite a bit yesterday and that he had several BM's and wasn't able to save urine on those occasions.  RN and tech came to his room to weight him and he was 104.9 kg standing.   Review of systems:    He denies any shortness of breath  he denies chest pain  Denies n/v  Vitals:   03/23/22 1559 03/23/22 2011 03/24/22 0620 03/24/22 0808  BP: (!) 144/68 (!) 143/66 129/65 135/63  Pulse: 63 (!) 58 (!) 57 (!) 59  Resp: _0 Temp: 98.2 F (36.8 C) 98 F (36.7 C) 97.7 F (36.5 C) (!) 97.5 F (36.4 C)  TempSrc: Oral Oral Oral Oral  SpO2: 100% 98% 98% 95%  Weight:      Height:        Physical Exam:     General adult male in bed in no acute distress HEENT normocephalic atraumatic extraocular movements intact sclera anicteric Neck supple trachea midline Lungs reduced breath sounds normal work of breathing at rest on room air  Heart S1S2 no rub Abdomen soft nontender nondistended Extremities 2+ edema lower extremities; his legs are wrapped Psych normal mood and affect Neuro - alert and oriented  GU -  no foley       Date                           Creat               eGFR   2017-2018                 1.11- 1.56   2020                          1.88   2021                          1.54- 2.09   Sept 2023                  3.56                 18 ml/min    11/1- 02/28/22           4.38 >> 3.28    AKI on CKD4, 14 >> 20 ml/min    11/17                                    5.90                 10     11/18                                    5.78                 10       UA negative    UNa <10, UCr 151    CXR -  IMPRESSION: Very low lung volumes. Bilateral patchy airspace opacities may represent  a combination of bronchovascular crowding, pulmonary edema, and possible multifocal infection.       Assessment/ Plan: 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 Likely ATN in setting of advanced CKD.  UA neg, urine lytes c/w pre-renal.  11/20 transplant renal US normal/ no hydro.  IR consulted for [facility], R IJ placed 11/22; this was later removed.  CRRT stopped on 11/21 Continue supportive care - cr and BUN are slowly uptrending with diuresis Continue Lasix 80 mg IV BID   Got metolazone 5 mg once on 12/1 most recently - redose today   We need strict ins/outs and daily weights We are wrapping his legs  I have discussed with the patient that if no improvement he may need dialysis by early next week and he voices understanding and agreement   CKD stage IV of renal allograft - baseline Cr 3.3 - 3.6 (eGFR 18-20) per nephrology charting.  Follows with Dr. Marval [facility] Renal transplant - done around 2010, cont prograf, myfortic and pred. Renal US stable.  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 Getting Austedo per primary    Disposition - continue inpatient monitoring.  Not appropriate for transfer to rehab at this time - we are having trouble getting care coordinated on the current floor and we are monitoring for the need for dialysis    Recent Labs  Lab 03/19/22 0416 03/19/22 1557 03/23/22 0550 03/24/22 0321  HGB 7.9*  --  7.7*  --   ALBUMIN 2.7*   < > 2.9* 2.9*  CALCIUM 7.8*   < > 8.2* 8.4*  PHOS 4.2   < > 5.0* 4.9*  CREATININE 3.79*   < > 4.54* 4.45*  K 4.5   < > 4.4 4.3   < > = values in this interval not displayed.   No results for input(s): "IRON", "TIBC", "FERRITIN" in the last 168 hours. Inpatient medications:  amLODipine  10 mg Oral  Daily   atorvastatin  10 mg Oral Daily   carvedilol  12.5 mg Oral BID   Deutetrabenazine  6 mg Oral BID   feeding supplement (GLUCERNA SHAKE)  237 mL Oral BID BM   furosemide  80 mg Intravenous BID   heparin  5,000 Units Subcutaneous Q8H   hydrALAZINE  50 mg Oral TID   insulin aspart  0-9 Units Subcutaneous TID WC   insulin aspart  5 Units Subcutaneous TID WC   insulin glargine-yfgn  12 Units Subcutaneous QHS   levothyroxine  112 mcg Oral Q0600   lipase/protease/amylase  72,000 Units Oral TID WC   melatonin  3 mg Oral QHS   multivitamin  1 tablet Oral QHS   mycophenolate  180 mg Oral BID   pantoprazole  40 mg Oral Daily   predniSONE  5 mg Oral Q breakfast   QUEtiapine  50 mg Oral QHS   senna  1 tablet Oral BID   tacrolimus  1.5 mg Oral BID     acetaminophen, albuterol, hydrALAZINE, naphazoline-glycerin, ondansetron **OR** ondansetron (ZOFRAN) IV, polyethylene glycol   Nathan Desanctis, MD 10:31 AM 03/24/2022

## 2022-03-24 NOTE — Progress Notes (Signed)
PROGRESS NOTE Nathan Tyler  ALP:[id number] DOB: 1955/07/30 DOA: 03/09/2022 PCP: Celene Squibb, MD   Brief Narrative/Hospital Course: 66 year old male with PMH CKD4 s/p transplant, chorea, DM II, HTN, HLD, hypothyroidism who presented with SOB. He was recently been hospitalized from 11/1 - 11/8 as well for CAP and completed antibiotics in the hospital and was discharged home with home health. Seen in the ER, CXR was more concerning for volume overload and he was found to have worsened renal function.  He was evaluated by nephrology and admitted for acute hypoxic respiratory failure, acute metabolic encephalopathy and started on CRRT for fluid overload and acutely worsening renal failure on the CT of CKD stage IV in a transplant kidney.Patient was transferred from [facility] to [facility] hospital 03/15/22. Followed by nephrology AKI improved back to baseline CK.Mental status improved> IR removed [facility] 11/27.  Patient is is having fluid overload nephrology diuresing  Once renal function/fluid overload stabilizes planning to discharge to CIR  Subjective: Seen and examined Resting comfortably His legs are in ACE wrap Overnight afebrile BP stable Labs reviewed BUN/creatinine about the same 91/4.4 Urine output charted 600 cc and he has had unmeasured output  Assessment and Plan: Principal Problem:   Acute renal failure superimposed on stage 4 chronic kidney disease (HCC) Active Problems:   Acute respiratory failure with hypoxia (HCC)   Acute on chronic anemia   DM2 (diabetes mellitus, type 2) (HCC)   Acute renal failure (HCC)   Malnutrition of moderate degree   AKI on CKD stage IV on transplant kidney: UA was negative.  Urine lites consistent with prerenal AKI, ultrasound normal. Creatinine peaked to 5.9-S/p CRRT 11/19-11/21-> Right TDC and foley removed 11/27. Nephrology following closely> being managed with IV Lasix, did not have leg edema, tolerating Lasix well, continue as per nephrology,monitor renal  function closely, patient voiding without retention.Creatinine holding in mid 4.5 range Recent Labs  Lab 03/21/22 0623 03/21/22 1539 03/22/22 0734 03/23/22 0550 03/24/22 0321  BUN 83* 85* 88* 95* 92*  CREATININE 4.26* 4.42* 4.55* 4.54* 4.45*   Acute hypoxic respiratory failure due to pulmonary edema: Resolved.  Doing well on room air  Lower leg edema 2+ pitting edema Fluid overload: Due to CKD,Still edematous leg but slowly improving on IV Lasix, continue Lasix 80 mg twice daily, intermittent metolazone> continue Ace wrap of the leg.  Continue plan as per nephrology with close monitoring renal function,intake output, difficult to measure output given unmeasured outputs but no retention.Net IO Since Admission: -9,249.85 mL [03/24/22 0723]   Urine retention needing in and out cath x1> reports he has been voiding well, check bladder scan postvoid PRN.  His Foley was removed on 35/32  Acute metabolic encephalopathy 2/2 sundowning hypoxia uremia renal failure,was on Precedex initially.  Symptoms resolved and at baseline currently  Status post renal transplant 2008: Continue his home tacrolimus, mycophenolate, and low-dose prednisone.    Anemia of DJM:EQASTMHDQQ stable in 7 to 8 g range, transfuse if less than 7 Recent Labs  Lab 03/19/22 0416 03/23/22 0550  HGB 7.9* 7.7*  HCT 26.2* 24.7*    DM2 with poorly controlled hyperglycemia:HbA1c 5.7.At home on NovoLog 5 units tid, Trulicity weekly.patient on prednisone contributing to hyperglycemia.  Blood sugar at times overall fairly stable continue on increased Semglee  of 12u, cont ssi and NovoLog 5 units pre-meal Recent Labs  Lab 03/22/22 2210 03/23/22 0810 03/23/22 1136 03/23/22 1533 03/23/22 2123  GLUCAP 278* 156* 73 222* 156*  HTN: Controlled on amlodipine 10 Coreg 12.5,  hydralazine '50mg'$  tid HLD: On Lipitor Hypothyroidism: Continue home dose Synthroid Involuntary movements/chorea-chronic,continue home dose of Austedo Class II  Obesity:Patient's Body mass index is 34.71 kg/m. : Will benefit with PCP follow-up, weight loss  healthy lifestyle and outpatient sleep evaluation.  Deconditioning/debility continue PT OT recommending CIR> but has improved, plan is for home with home health.   Moderate malnutrition augment diet as below Nutrition Problem: Moderate Malnutrition Etiology: chronic illness (renal transplant 2008 with CKD 4 on chonic immunosuppression) Signs/Symptoms: mild muscle depletion, mild fat depletion Interventions: Liberalize Diet, Refer to RD note for recommendations, MVI, Ensure Enlive (each supplement provides 350kcal and 20 grams of protein)  DVT prophylaxis: heparin injection 5,000 Units Start: 03/09/22 2200 Code Status:   Code Status: DNR Family Communication: plan of care discussed with patient  Patient status is: Inpatient because of acute renal failure Level of care: Med-Surg  Dispo: The patient is from: home w/ wife            Anticipated disposition: Remains in house for fluid overload, plan is for home once improves and okay with nephrology   Objective: Vitals last 24 hrs: Vitals:   03/23/22 0814 03/23/22 1559 03/23/22 2011 03/24/22 0620  BP: (!) 148/67 (!) 144/68 (!) 143/66 129/65  Pulse: 63 63 (!) 58 (!) 57  Resp: '18 18 16 17  '$ Temp: 97.6 F (36.4 C) 98.2 F (36.8 C) 98 F (36.7 C) 97.7 F (36.5 C)  TempSrc: Oral Oral Oral Oral  SpO2: 96% 100% 98% 98%  Weight:      Height:       Weight change:   Physical Examination: General exam: AAOX3, weak,older appearing HEENT:Oral mucosa moist, Ear/Nose WNL grossly, dentition normal. Respiratory system: bilaterally CLEAR BS, no use of accessory muscle Cardiovascular system: S1 & S2 +, regular rate, JVD NEG. Gastrointestinal system: Abdomen soft,NT,ND,BS+ Nervous System:Alert, awake, moving extremities and grossly nonfocal Extremities: LE ankle edema  MILD W./ ACE WRAP, lower extremities warm Skin: No rashes,no icterus. MSK: Normal  muscle bulk,tone, power    Medications reviewed:  Scheduled Meds:  amLODipine  10 mg Oral Daily   atorvastatin  10 mg Oral Daily   carvedilol  12.5 mg Oral BID   Deutetrabenazine  6 mg Oral BID   feeding supplement (GLUCERNA SHAKE)  237 mL Oral BID BM   furosemide  80 mg Intravenous BID   heparin  5,000 Units Subcutaneous Q8H   hydrALAZINE  50 mg Oral TID   insulin aspart  0-9 Units Subcutaneous TID WC   insulin aspart  5 Units Subcutaneous TID WC   insulin glargine-yfgn  12 Units Subcutaneous QHS   levothyroxine  112 mcg Oral Q0600   lipase/protease/amylase  72,000 Units Oral TID WC   melatonin  3 mg Oral QHS   multivitamin  1 tablet Oral QHS   mycophenolate  180 mg Oral BID   pantoprazole  40 mg Oral Daily   predniSONE  5 mg Oral Q breakfast   QUEtiapine  50 mg Oral QHS   senna  1 tablet Oral BID   tacrolimus  1.5 mg Oral BID   Continuous Infusions:    Diet Order             Diet renal/carb modified with fluid restriction Diet-HS Snack? Nothing; Fluid restriction: 1200 mL Fluid; Room service appropriate? Yes; Fluid consistency: Thin  Diet effective now                  Intake/Output Summary (Last 24 hours)  at 03/24/2022 9326 Last data filed at 03/23/2022 2141 Gross per 24 hour  Intake 477 ml  Output 600 ml  Net -123 ml   Net IO Since Admission: -9,249.85 mL [03/24/22 0723]  Wt Readings from Last 3 Encounters:  03/22/22 112.9 kg  02/22/22 104.3 kg  11/24/21 107 kg     Unresulted Labs (From admission, onward)     Start     Ordered   03/12/22 0500  Renal function panel (daily at 0500)  Daily at 5am,   R     Question:  Specimen collection method  Answer:  Lab=Lab collect   03/11/22 1626   03/12/22 0500  Magnesium  Daily at 5am,   R     Question:  Specimen collection method  Answer:  Lab=Lab collect   03/11/22 1626   Signed and Held  CBC  (heparin)  Once,   R       Comments: Baseline for heparin therapy IF NOT ALREADY DRAWN.  Notify MD if PLT < 100 K.    Question:  Specimen collection method  Answer:  Lab=Lab collect   Signed and Held   Signed and Held  Creatinine, serum  (heparin)  Once,   R       Comments: Baseline for heparin therapy IF NOT ALREADY DRAWN.   Question:  Specimen collection method  Answer:  Lab=Lab collect   Signed and Held   Signed and Held  Comprehensive metabolic panel  Tomorrow morning,   R       Question:  Specimen collection method  Answer:  Lab=Lab collect   Signed and Held   Signed and Held  CBC with Differential/Platelet  Tomorrow morning,   R       Question:  Specimen collection method  Answer:  Lab=Lab collect   Signed and Held          Data Reviewed: I have personally reviewed following labs and imaging studies CBC: Recent Labs  Lab 03/19/22 0416 03/23/22 0550  WBC 6.0 5.8  HGB 7.9* 7.7*  HCT 26.2* 24.7*  MCV 97.8 95.4  PLT 182 712   Basic Metabolic Panel: Recent Labs  Lab 03/20/22 0413 03/20/22 1536 03/21/22 0623 03/21/22 1539 03/22/22 0734 03/23/22 0550 03/24/22 0321  NA 134*   < > 137 139 139 139 140  K 4.9   < > 4.5 4.9 4.4 4.4 4.3  CL 103   < > 105 102 104 105 103  CO2 24   < > '23 24 25 24 27  '$ GLUCOSE 313*   < > 223* 210* 142* 186* 161*  BUN 77*   < > 83* 85* 88* 95* 92*  CREATININE 4.26*   < > 4.26* 4.42* 4.55* 4.54* 4.45*  CALCIUM 7.7*   < > 7.9* 8.4* 8.4* 8.2* 8.4*  MG 2.6*  --  2.7*  --  2.5* 2.4 2.5*  PHOS 4.6   < > 4.4 3.9 4.4 5.0* 4.9*   < > = values in this interval not displayed.   No results found for this or any previous visit (from the past 240 hour(s)).  Antimicrobials: Anti-infectives (From admission, onward)    Start     Dose/Rate Route Frequency Ordered Stop   03/14/22 1315  ceFAZolin (ANCEF) IVPB 2g/100 mL premix        2 g 200 mL/hr over 30 Minutes Intravenous On call to O.R. 03/14/22 1300 03/14/22 1400   03/11/22 1600  levofloxacin (LEVAQUIN) IVPB 500 mg  Status:  Discontinued        500 mg 100 mL/hr over 60 Minutes Intravenous Every 48 hours 03/09/22  1808 03/11/22 1539   03/09/22 1600  levofloxacin (LEVAQUIN) IVPB 500 mg        500 mg 100 mL/hr over 60 Minutes Intravenous  Once 03/09/22 1551 03/09/22 1730      Culture/Microbiology    Component Value Date/Time   SDES BLOOD LEFT FOREARM 02/21/2022 2236   SPECREQUEST  02/21/2022 2236    BOTTLES DRAWN AEROBIC AND ANAEROBIC Blood Culture adequate volume   CULT  02/21/2022 2236    NO GROWTH 5 DAYS Performed at [facility], [place] [address], [place], [place] [address]    REPTSTATUS 02/26/2022 FINAL 02/21/2022 2236    Radiology Studies: No results found.   LOS: 15 days   Antonieta Pert, MD [facility]  03/24/2022, 7:23 AM

## 2022-03-25 DIAGNOSIS — N184 Chronic kidney disease, stage 4 (severe): Secondary | ICD-10-CM | POA: Diagnosis not present

## 2022-03-25 DIAGNOSIS — N179 Acute kidney failure, unspecified: Secondary | ICD-10-CM | POA: Diagnosis not present

## 2022-03-25 LAB — RENAL FUNCTION PANEL
Albumin: 2.9 g/dL — ABNORMAL LOW (ref 3.5–5.0)
Anion gap: 9 (ref 5–15)
BUN: 94 mg/dL — ABNORMAL HIGH (ref 8–23)
CO2: 26 mmol/L (ref 22–32)
Calcium: 8.1 mg/dL — ABNORMAL LOW (ref 8.9–10.3)
Chloride: 104 mmol/L (ref 98–111)
Creatinine, Ser: 4.74 mg/dL — ABNORMAL HIGH (ref 0.61–1.24)
GFR, Estimated: 13 mL/min — ABNORMAL LOW (ref >60)
Glucose, Bld: 169 mg/dL — ABNORMAL HIGH (ref 70–99)
Phosphorus: 4.9 mg/dL — ABNORMAL HIGH (ref 2.5–4.6)
Potassium: 4.2 mmol/L (ref 3.5–5.1)
Sodium: 139 mmol/L (ref 135–145)

## 2022-03-25 LAB — GLUCOSE, CAPILLARY
Glucose-Capillary: 99 mg/dL (ref 70–99)
Glucose-Capillary: 99 mg/dL (ref 70–99)
Glucose-Capillary: 149 mg/dL — ABNORMAL HIGH (ref 70–99)
Glucose-Capillary: 169 mg/dL — ABNORMAL HIGH (ref 70–99)

## 2022-03-25 LAB — MAGNESIUM: Magnesium: 2.3 mg/dL (ref 1.7–2.4)

## 2022-03-25 MED ORDER — DARBEPOETIN ALFA 40 MCG/0.4ML IJ SOSY
40.0000 ug | PREFILLED_SYRINGE | INTRAMUSCULAR | Status: DC
  Administered 2022-03-25: 40 ug via SUBCUTANEOUS
  Filled 2022-03-25: qty 0.4

## 2022-03-25 MED ORDER — FUROSEMIDE 40 MG PO TABS
80.0000 mg | ORAL_TABLET | Freq: Every day | ORAL | Status: DC
  Administered 2022-03-26: 80 mg via ORAL
  Filled 2022-03-25: qty 2

## 2022-03-25 MED ORDER — SODIUM CHLORIDE 0.9 % IV SOLN
250.0000 mg | Freq: Every day | INTRAVENOUS | Status: AC
  Administered 2022-03-25 – 2022-03-26 (×2): 250 mg via INTRAVENOUS
  Filled 2022-03-25 (×3): qty 20

## 2022-03-25 NOTE — Progress Notes (Signed)
Occupational Therapy Treatment Patient Details Name: Nathan Tyler MRN: [id number] DOB: [birth year] Today's Date: 03/25/2022   History of present illness 66 y.o. male [facility] with dyspnea, weakness, respiratory failure and acute worsening renal failure. CRRT 11/19-11/21. Transferred to [facility] 11/23.  PMhx: renal transplant with CKD, T2DM, HTN, hypothyroidism,  movement disorder, recent admission 11/1-11/8/23 for CAP   OT comments  Upon entering urinal had spilled on floor, gown was dirty- OT Aed pt get cleaned up.   Recommendations for follow up therapy are one component of a multi-disciplinary discharge planning process, led by the attending physician.  Recommendations may be updated based on patient status, additional functional criteria and insurance authorization.    Follow Up Recommendations  Home health OT     Assistance Recommended at Discharge Frequent or constant Supervision/Assistance  Patient can return home with the following  A little help with walking and/or transfers;Assistance with cooking/housework;Direct supervision/assist for medications management;Direct supervision/assist for financial management;Assist for transportation;Help with stairs or ramp for entrance;A little help with bathing/dressing/bathroom   Equipment Recommendations  None recommended by OT    Recommendations for Other Services      Precautions / Restrictions Precautions Precautions: Fall       Mobility Bed Mobility               General bed mobility comments: pt in chair    Transfers   Equipment used: None Transfers: Sit to/from Stand Sit to Stand: Supervision Stand pivot transfers: Supervision               Balance Overall balance assessment: Needs assistance Sitting-balance support: No upper extremity supported, Feet supported Sitting balance-Leahy Scale: Good     Standing balance support: No upper extremity supported, During functional activity Standing  balance-Leahy Scale: Good Standing balance comment: pt able to stand in room and walk without AD                           ADL either performed or assessed with clinical judgement   ADL Overall ADL's : Needs assistance/impaired     Grooming: Wash/dry hands;Wash/dry face;Oral care;Supervision/safety Grooming Details (indicate cue type and reason): stood at sink with supervision, no cues needed     Lower Body Bathing: Minimal assistance;Sitting/lateral leans;Sit to/from stand           Toilet Transfer: Min guard;Ambulation Toilet Transfer Details (indicate cue type and reason): walked to bathroom Toileting- Clothing Manipulation and Hygiene: Supervision/safety;Sit to/from stand       Functional mobility during ADLs: Min guard General ADL Comments: patient continues to improve, recommendation now upgraded to [facility]    Extremity/Trunk Assessment Upper Extremity Assessment Upper Extremity Assessment: Generalized weakness            Vision Patient Visual Report: No change from baseline            Cognition Arousal/Alertness: Awake/alert Behavior During Therapy: WFL for tasks assessed/performed Overall Cognitive Status: Impaired/Different from baseline Area of Impairment: Safety/judgement                         Safety/Judgement: Decreased awareness of safety     General Comments: OT ask pt to slow down with his movements and he said 'my football coach never ask me to do that'                   Pertinent Vitals/ Pain  Pain Assessment Pain Assessment: No/denies pain         Frequency  Min 2X/week        Progress Toward Goals  OT Goals(current goals can now be found in the care plan section)     Acute Rehab OT Goals OT Goal Formulation: With patient Time For Goal Achievement: 03/30/22 Potential to Achieve Goals: Good  Plan Discharge plan needs to be updated    Co-evaluation                 AM-PAC OT "6  Clicks" Daily Activity     Outcome Measure   Help from another person eating meals?: None   Help from another person toileting, which includes using toliet, bedpan, or urinal?: A Little Help from another person bathing (including washing, rinsing, drying)?: A Little Help from another person to put on and taking off regular upper body clothing?: None   6 Click Score: 14    End of Session    OT Visit Diagnosis: Unsteadiness on feet (R26.81);Muscle weakness (generalized) (M62.81);Other symptoms and signs involving cognitive function   Activity Tolerance Patient tolerated treatment well   Patient Left in chair;with call bell/phone within reach   Nurse Communication Mobility status        Time: 1010-1030 OT Time Calculation (min): 20 min  Charges: OT General Charges $OT Visit: 1 Visit OT Treatments $Self Care/Home Management : 8-22 mins  Kari Baars, [place] [phone] Office- [phone], Edwena Felty D 03/25/2022, 12:39 PM

## 2022-03-25 NOTE — Progress Notes (Signed)
Mobility Specialist Progress Note:   03/25/22 0940  Mobility  Activity Ambulated with assistance in hallway  Level of Assistance Contact guard assist, steadying assist  Assistive Device Front wheel walker  Distance Ambulated (ft) 500 ft  Activity Response Tolerated well  Mobility Referral Yes  $Mobility charge 1 Mobility   Pt eager for mobility session. No physical assistance required, only min guard for safety d/t pt erratic movements. No LOB or unsteadiness noted. Pt left sitting EOB with all needs met.   Nelta Numbers Mobility Specialist Please contact via SecureChat or  Rehab office at [phone]

## 2022-03-25 NOTE — Progress Notes (Signed)
[facility] Progress Note  Subjective:  He had 2.1 liters UOP over 12/2 as well as five unmeasured voids.  Didn't get weighed this am.  Feels really good.  Denies any dificulty with urination.  Now has two urinals.  He denies any known cancer and we discussed the risks/benefits/indications for ESA and he does consent for ESA.    Review of systems:    He denies any shortness of breath  he denies chest pain  Denies n/v  Vitals:   03/24/22 1408 03/24/22 2249 03/25/22 0423 03/25/22 0800  BP: 130/65 138/75 (!) 143/75 (!) 144/73  Pulse: (!) 58 64 (!) 58 (!) 59  Resp: 19 18 17 18  Temp: 98.3 F (36.8 C) 98 F (36.7 C) 98.1 F (36.7 C) (!) 97.5 F (36.4 C)  TempSrc: Oral Oral Oral Oral  SpO2: 98% 97% 97% 99%  Weight:      Height:        Physical Exam:      General adult male in bed in no acute distress HEENT normocephalic atraumatic extraocular movements intact sclera anicteric Neck supple trachea midline Lungs reduced breath sounds normal work of breathing at rest on room air  Heart S1S2 no rub Abdomen soft nontender nondistended Extremities 1-2+ edema lower extremities; his legs are wrapped Psych normal mood and affect Neuro - alert and oriented and conversant; provides hx and follows commands; involuntary chorea movements (at baseline)  GU -  no foley       Date                           Creat               eGFR   2017-2018                 1.11- 1.56   2020                          1.88   2021                          1.54- 2.09   Sept 2023                  3.56                 18 ml/min    11/1- 02/28/22           4.38 >> 3.28    AKI on CKD4, 14 >> 20 ml/min    11/17                                    5.90                 10     11/18                                    5.78                 10       UA negative    UNa <10, UCr 151    CXR - IMPRESSION: Very low lung volumes. Bilateral patchy airspace opacities may represent a combination of bronchovascular    crowding, pulmonary edema, and possible multifocal infection.       Assessment/ Plan: 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 Likely ATN in setting of advanced CKD.  UA neg, urine lytes c/w pre-renal.  11/20 transplant renal US normal/ no hydro.  IR consulted for [facility], R IJ placed 11/22; this was later removed.  CRRT stopped on 11/21 Continue Lasix 80 mg IV BID today then transition to lasix 80 mg PO daily tomorrow Got metolazone 5 mg once on 12/1 and 12/2 - defer redose today  We need strict ins/outs and daily weights We are wrapping his legs Finally making headway from volume standpoint and getting some weights and ins/outs from the floor No emergent indication for dialysis.  Nonoliguric but with rising BUN/Cr.  We are adjusting diuretics as above.  I have discussed with the patient that if no improvement he may need dialysis by early this week and he voices understanding and agreement CKD stage IV of renal allograft - baseline Cr 3.3 - 3.6 (eGFR 18-20) per nephrology charting.  Follows with Dr. Marval Regal Renal transplant - done around 2010, cont prograf, myfortic and pred. Renal US stable.  Anemia of CKD - CBC in AM.  Patient is iron deficient - ferric gluconate x 2 doses.  Plan for aranesp 40 mcg once on 12/3 and weekly   DM2 - Per primary team  HTN - acceptable on current regimen  Movement disorder - chronic issue, per charting this is not myoclonus but is his baseline "chorea" per the sister. Getting Austedo per primary    Disposition - continue inpatient monitoring.  Not appropriate for transfer to rehab at this time - we are having trouble getting care coordinated on the current floor and we are monitoring for the need for dialysis    Recent Labs  Lab 03/19/22 0416 03/19/22 1557 03/23/22 0550 03/24/22 0321 03/25/22 0248  HGB 7.9*  --  7.7*  --   --   ALBUMIN 2.7*    < > 2.9* 2.9* 2.9*  CALCIUM 7.8*   < > 8.2* 8.4* 8.1*  PHOS 4.2   < > 5.0* 4.9* 4.9*  CREATININE 3.79*   < > 4.54* 4.45* 4.74*  K 4.5   < > 4.4 4.3 4.2   < > = values in this interval not displayed.   No results for input(s): "IRON", "TIBC", "FERRITIN" in the last 168 hours. Inpatient medications:  amLODipine  10 mg Oral Daily   atorvastatin  10 mg Oral Daily   carvedilol  12.5 mg Oral BID   Deutetrabenazine  6 mg Oral BID   feeding supplement (GLUCERNA SHAKE)  237 mL Oral BID BM   furosemide  80 mg Intravenous BID   heparin  5,000 Units Subcutaneous Q8H   hydrALAZINE  50 mg Oral TID   insulin aspart  0-9 Units Subcutaneous TID WC   insulin aspart  5 Units Subcutaneous TID WC   insulin glargine-yfgn  12 Units Subcutaneous QHS   levothyroxine  112 mcg Oral Q0600   lipase/protease/amylase  72,000 Units Oral TID WC   melatonin  3 mg Oral QHS   multivitamin  1 tablet Oral QHS   mycophenolate  180 mg Oral BID   pantoprazole  40 mg Oral Daily   predniSONE  5 mg Oral Q breakfast   QUEtiapine  50 mg Oral QHS  senna  1 tablet Oral BID   tacrolimus  1.5 mg Oral BID     acetaminophen, albuterol, hydrALAZINE, naphazoline-glycerin, ondansetron **OR** ondansetron (ZOFRAN) IV, polyethylene glycol    C , MD 9:39 AM 03/25/2022 

## 2022-03-25 NOTE — Progress Notes (Signed)
PROGRESS NOTE Nathan Tyler  TTS:[id number] DOB: [birth year] DOA: 03/09/2022 PCP: Celene Squibb, MD   Brief Narrative/Hospital Course: 66 year old male with PMH CKD4 s/p transplant, chorea, DM II, HTN, HLD, hypothyroidism who presented with SOB. He was recently been hospitalized from 11/1 - 11/8 as well for CAP and completed antibiotics in the hospital and was discharged home with home health. Seen in the ER, CXR was more concerning for volume overload and he was found to have worsened renal function.  He was evaluated by nephrology and admitted for acute hypoxic respiratory failure, acute metabolic encephalopathy and started on CRRT for fluid overload and acutely worsening renal failure on the CT of CKD stage IV in a transplant kidney.Patient was transferred from [facility] to [facility] hospital 03/15/22. Followed by nephrology AKI improved back to baseline CK.Mental status improved> IR removed [facility] 11/27.  Patient is is having fluid overload nephrology diuresing  Initially plan was for CIR but he is doing well with mobility wise> PT recommending home with home health at this time.  Subjective:  Seen and examined this morning resting comfortably legs are in Ace wrap edema seems to be improving.  No shortness of breath orthopnea chest pain.    Assessment and Plan: Principal Problem:   Acute renal failure superimposed on stage 4 chronic kidney disease (HCC) Active Problems:   Acute respiratory failure with hypoxia (HCC)   Acute on chronic anemia   DM2 (diabetes mellitus, type 2) (HCC)   Acute renal failure (HCC)   Malnutrition of moderate degree   AKI on CKD stage IV on transplant kidney Fluid overload with pulmonary edema and lower leg edema:  UA was negative.  Urine lites consistent with prerenal AKI, ultrasound normal. Creatinine peaked to 5.9, and having fluid overload and lower leg edema,pulmonary edema-S/p CRRT 11/19-11/21-> Right TDC and foley removed 11/27.  Hypoxia resolved.  Still having leg edema  nephrology following closely diuresing with IV Lasix monitor intake output BUN/creatinine closely as below slight bump in creatinine.  BUN is still high. Recent Labs  Lab 03/21/22 1539 03/22/22 0734 03/23/22 0550 03/24/22 0321 03/25/22 0248  BUN 85* 88* 95* 92* 94*  CREATININE 4.42* 4.55* 4.54* 4.45* 4.74*   Intake/Output Summary (Last 24 hours) at 03/25/2022 0950 Last data filed at 03/25/2022 0923 Gross per 24 hour  Intake 720 ml  Output 2325 ml  Net -1605 ml  Net IO Since Admission: -10,514.85 mL [03/25/22 0953]    Acute hypoxic respiratory failure due to pulmonary edema: Resolved   Urine retention needing in and out cath x1> reports he has been voiding well. Foley was removed on 30/07  Acute metabolic encephalopathy 2/2 sundowning hypoxia uremia renal failure,was on Precedex initially.  Symptoms resolved and at baseline currently  Status post renal transplant 2008: Continue his home tacrolimus, mycophenolate, and low-dose prednisone.    Anemia of CKD:Hb stable in 7 to 8 g range, transfuse if < 7gm.  Ferric gluconate ordered  12/3 x2 doses by nephro Recent Labs  Lab 03/19/22 0416 03/23/22 0550  HGB 7.9* 7.7*  HCT 26.2* 24.7*    DM2 with poorly controlled hyperglycemia:HbA1c 5.7.At home on NovoLog 5 units tid, Trulicity weekly.patient on prednisone contributing to hyperglycemia.  Blood sugar at times overall fairly stable continue on increased Semglee  of 12u, cont ssi and NovoLog 5 units pre-meal continue to monitor as below Recent Labs  Lab 03/24/22 0816 03/24/22 1150 03/24/22 1649 03/24/22 2306 03/25/22 0759  GLUCAP 108* 133* 98 138* 99   MAU:[id number]  amlodipine 10 Coreg 12.5, hydralazine '50mg'$  tid HLD: On Lipitor Hypothyroidism: Continue home dose Synthroid Involuntary movements/chorea-chronic,continue home dose of Austedo  Class II Obesity:Patient's Body mass index is 32.25 kg/m. : Will benefit with PCP follow-up, weight loss  healthy lifestyle and outpatient  sleep evaluation.  Deconditioning/debility continue PT OT recommending CIR initially now improving plans for home with  Moderate malnutrition augment diet as below Nutrition Problem: Moderate Malnutrition Etiology: chronic illness (renal transplant 2008 with CKD 4 on chonic immunosuppression) Signs/Symptoms: mild muscle depletion, mild fat depletion Interventions: Liberalize Diet, Refer to RD note for recommendations, MVI, Ensure Enlive (each supplement provides 350kcal and 20 grams of protein)  DVT prophylaxis: heparin injection 5,000 Units Start: 03/09/22 2200 Code Status:   Code Status: DNR Family Communication: plan of care discussed with patient  Patient status is: Inpatient because of acute renal failure Level of care: Med-Surg  Dispo: The patient is from: home w/ wife            Anticipated disposition: Home with home health once fluid overload/leg edema improves and okay nephrology   Objective: Vitals last 24 hrs: Vitals:   03/24/22 1408 03/24/22 2249 03/25/22 0423 03/25/22 0800  BP: 130/65 138/75 (!) 143/75 (!) 144/73  Pulse: (!) 58 64 (!) 58 (!) 59  Resp: '19 18 17 18  '$ Temp: 98.3 F (36.8 C) 98 F (36.7 C) 98.1 F (36.7 C) (!) 97.5 F (36.4 C)  TempSrc: Oral Oral Oral Oral  SpO2: 98% 97% 97% 99%  Weight:      Height:       Weight change:   Physical Examination: General exam: alert awake, oriented, older than stated age HEENT:Oral mucosa moist, Ear/Nose WNL grossly Respiratory system: Bilaterally clear BS, no use of accessory muscle Cardiovascular system: S1 & S2 +, No JVD. Gastrointestinal system: Abdomen soft,NT,ND, BS+ Nervous System: Alert, awake, moving extremities well nonfocal  Extremities: LE edema + w/ ace wrap,distal peripheral pulses palpable.  Skin: No rashes,no icterus. MSK: Normal muscle bulk,tone, power  Medications reviewed:  Scheduled Meds:  amLODipine  10 mg Oral Daily   atorvastatin  10 mg Oral Daily   carvedilol  12.5 mg Oral BID    darbepoetin (ARANESP) injection - NON-DIALYSIS  40 mcg Subcutaneous Q Sun-1800   Deutetrabenazine  6 mg Oral BID   feeding supplement (GLUCERNA SHAKE)  237 mL Oral BID BM   furosemide  80 mg Intravenous BID   [START ON 03/26/2022] furosemide  80 mg Oral Daily   heparin  5,000 Units Subcutaneous Q8H   hydrALAZINE  50 mg Oral TID   insulin aspart  0-9 Units Subcutaneous TID WC   insulin aspart  5 Units Subcutaneous TID WC   insulin glargine-yfgn  12 Units Subcutaneous QHS   levothyroxine  112 mcg Oral Q0600   lipase/protease/amylase  72,000 Units Oral TID WC   melatonin  3 mg Oral QHS   multivitamin  1 tablet Oral QHS   mycophenolate  180 mg Oral BID   pantoprazole  40 mg Oral Daily   predniSONE  5 mg Oral Q breakfast   QUEtiapine  50 mg Oral QHS   senna  1 tablet Oral BID   tacrolimus  1.5 mg Oral BID   Continuous Infusions:  ferric gluconate (FERRLECIT) IVPB        Diet Order             Diet renal/carb modified with fluid restriction Diet-HS Snack? Nothing; Fluid restriction: 1200 mL Fluid; Room service appropriate?  Yes; Fluid consistency: Thin  Diet effective now                  Intake/Output Summary (Last 24 hours) at 03/25/2022 0949 Last data filed at 03/25/2022 7169 Gross per 24 hour  Intake 720 ml  Output 2325 ml  Net -1605 ml   Net IO Since Admission: -10,514.85 mL [03/25/22 0949]  Wt Readings from Last 3 Encounters:  03/24/22 104.9 kg  02/22/22 104.3 kg  11/24/21 107 kg     Unresulted Labs (From admission, onward)     Start     Ordered   03/26/22 0500  CBC  Tomorrow morning,   R       Question:  Specimen collection method  Answer:  Lab=Lab collect   03/25/22 0914   03/12/22 0500  Renal function panel (daily at 0500)  Daily at 5am,   R     Question:  Specimen collection method  Answer:  Lab=Lab collect   03/11/22 1626   03/12/22 0500  Magnesium  Daily at 5am,   R     Question:  Specimen collection method  Answer:  Lab=Lab collect   03/11/22 1626    Signed and Held  CBC  (heparin)  Once,   R       Comments: Baseline for heparin therapy IF NOT ALREADY DRAWN.  Notify MD if PLT < 100 K.   Question:  Specimen collection method  Answer:  Lab=Lab collect   Signed and Held   Signed and Held  Creatinine, serum  (heparin)  Once,   R       Comments: Baseline for heparin therapy IF NOT ALREADY DRAWN.   Question:  Specimen collection method  Answer:  Lab=Lab collect   Signed and Held   Signed and Held  Comprehensive metabolic panel  Tomorrow morning,   R       Question:  Specimen collection method  Answer:  Lab=Lab collect   Signed and Held   Signed and Held  CBC with Differential/Platelet  Tomorrow morning,   R       Question:  Specimen collection method  Answer:  Lab=Lab collect   Signed and Held          Data Reviewed: I have personally reviewed following labs and imaging studies CBC: Recent Labs  Lab 03/19/22 0416 03/23/22 0550  WBC 6.0 5.8  HGB 7.9* 7.7*  HCT 26.2* 24.7*  MCV 97.8 95.4  PLT 182 678   Basic Metabolic Panel: Recent Labs  Lab 03/21/22 0623 03/21/22 1539 03/22/22 0734 03/23/22 0550 03/24/22 0321 03/25/22 0248  NA 137 139 139 139 140 139  K 4.5 4.9 4.4 4.4 4.3 4.2  CL 105 102 104 105 103 104  CO2 '23 24 25 24 27 26  '$ GLUCOSE 223* 210* 142* 186* 161* 169*  BUN 83* 85* 88* 95* 92* 94*  CREATININE 4.26* 4.42* 4.55* 4.54* 4.45* 4.74*  CALCIUM 7.9* 8.4* 8.4* 8.2* 8.4* 8.1*  MG 2.7*  --  2.5* 2.4 2.5* 2.3  PHOS 4.4 3.9 4.4 5.0* 4.9* 4.9*   No results found for this or any previous visit (from the past 240 hour(s)).  Antimicrobials: Anti-infectives (From admission, onward)    Start     Dose/Rate Route Frequency Ordered Stop   03/14/22 1315  ceFAZolin (ANCEF) IVPB 2g/100 mL premix        2 g 200 mL/hr over 30 Minutes Intravenous On call to O.R. 03/14/22 1300 03/14/22 1400   03/11/22  1600  levofloxacin (LEVAQUIN) IVPB 500 mg  Status:  Discontinued        500 mg 100 mL/hr over 60 Minutes Intravenous Every  48 hours 03/09/22 1808 03/11/22 1539   03/09/22 1600  levofloxacin (LEVAQUIN) IVPB 500 mg        500 mg 100 mL/hr over 60 Minutes Intravenous  Once 03/09/22 1551 03/09/22 1730      Culture/Microbiology    Component Value Date/Time   SDES BLOOD LEFT FOREARM 02/21/2022 2236   SPECREQUEST  02/21/2022 2236    BOTTLES DRAWN AEROBIC AND ANAEROBIC Blood Culture adequate volume   CULT  02/21/2022 2236    NO GROWTH 5 DAYS Performed at [facility], [place] [address], [place], [place] [address]    REPTSTATUS 02/26/2022 FINAL 02/21/2022 2236    Radiology Studies: No results found.   LOS: 16 days   Antonieta Pert, MD [facility]  03/25/2022, 9:49 AM

## 2022-03-26 DIAGNOSIS — N184 Chronic kidney disease, stage 4 (severe): Secondary | ICD-10-CM | POA: Diagnosis not present

## 2022-03-26 DIAGNOSIS — N179 Acute kidney failure, unspecified: Secondary | ICD-10-CM | POA: Diagnosis not present

## 2022-03-26 LAB — RENAL FUNCTION PANEL
Albumin: 3.1 g/dL — ABNORMAL LOW (ref 3.5–5.0)
Anion gap: 11 (ref 5–15)
BUN: 94 mg/dL — ABNORMAL HIGH (ref 8–23)
CO2: 27 mmol/L (ref 22–32)
Calcium: 8.2 mg/dL — ABNORMAL LOW (ref 8.9–10.3)
Chloride: 102 mmol/L (ref 98–111)
Creatinine, Ser: 4.95 mg/dL — ABNORMAL HIGH (ref 0.61–1.24)
GFR, Estimated: 12 mL/min — ABNORMAL LOW (ref >60)
Glucose, Bld: 135 mg/dL — ABNORMAL HIGH (ref 70–99)
Phosphorus: 5.4 mg/dL — ABNORMAL HIGH (ref 2.5–4.6)
Potassium: 4.4 mmol/L (ref 3.5–5.1)
Sodium: 140 mmol/L (ref 135–145)

## 2022-03-26 LAB — CBC
HCT: 24.5 % — ABNORMAL LOW (ref 39.0–52.0)
Hemoglobin: 7.8 g/dL — ABNORMAL LOW (ref 13.0–17.0)
MCH: 29.9 pg (ref 26.0–34.0)
MCHC: 31.8 g/dL (ref 30.0–36.0)
MCV: 93.9 fL (ref 80.0–100.0)
Platelets: 236 10*3/uL (ref 150–400)
RBC: 2.61 MIL/uL — ABNORMAL LOW (ref 4.22–5.81)
RDW: 15.2 % (ref 11.5–15.5)
WBC: 5.1 10*3/uL (ref 4.0–10.5)
nRBC: 0 % (ref 0.0–0.2)

## 2022-03-26 LAB — GLUCOSE, CAPILLARY
Glucose-Capillary: 137 mg/dL — ABNORMAL HIGH (ref 70–99)
Glucose-Capillary: 117 mg/dL — ABNORMAL HIGH (ref 70–99)
Glucose-Capillary: 148 mg/dL — ABNORMAL HIGH (ref 70–99)
Glucose-Capillary: 172 mg/dL — ABNORMAL HIGH (ref 70–99)

## 2022-03-26 LAB — BASIC METABOLIC PANEL
Anion gap: 12 (ref 5–15)
BUN: 94 mg/dL — ABNORMAL HIGH (ref 8–23)
CO2: 25 mmol/L (ref 22–32)
Calcium: 8.1 mg/dL — ABNORMAL LOW (ref 8.9–10.3)
Chloride: 101 mmol/L (ref 98–111)
Creatinine, Ser: 4.95 mg/dL — ABNORMAL HIGH (ref 0.61–1.24)
GFR, Estimated: 12 mL/min — ABNORMAL LOW (ref >60)
Glucose, Bld: 133 mg/dL — ABNORMAL HIGH (ref 70–99)
Potassium: 4.3 mmol/L (ref 3.5–5.1)
Sodium: 138 mmol/L (ref 135–145)

## 2022-03-26 LAB — MAGNESIUM: Magnesium: 2.3 mg/dL (ref 1.7–2.4)

## 2022-03-26 NOTE — Progress Notes (Signed)
[facility] Progress Note  Subjective:   had 1200 UOP over 12/3 as well as 3 unmeasured voids.   Feels really good.  Denies any dificulty with urination.    Vitals:   03/25/22 1724 03/25/22 1956 03/26/22 0500 03/26/22 0525  BP: (!) 151/73 (!) 141/74  136/74  Pulse: 70 65  65  Resp: _0 Temp: 98.3 F (36.8 C) 98.6 F (37 C)  97.8 F (36.6 C)  TempSrc: Oral Oral  Oral  SpO2: 92% 99%  96%  Weight:   103 kg   Height:        Physical Exam:      General adult male in bed in no acute distress HEENT normocephalic atraumatic extraocular movements intact sclera anicteric Neck supple trachea midline Lungs reduced breath sounds normal work of breathing at rest on room air  Heart S1S2 no rub Abdomen soft nontender nondistended Extremities 1-2+ edema lower extremities; his legs are wrapped Psych normal mood and affect Neuro - alert and oriented and conversant; provides hx and follows commands; involuntary chorea movements (at baseline)  GU -  no foley       Date                           Creat               eGFR   2017-2018                 1.11- 1.56   2020                          1.88   2021                          1.54- 2.09   Sept 2023                  3.56                 18 ml/min    11/1- 02/28/22           4.38 >> 3.28    AKI on CKD4, 14 >> 20 ml/min    11/17                                    5.90                 10     11/18                                    5.78                 10       UA negative    UNa <10, UCr 151        Assessment/ Plan: 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 Likely ATN in setting of advanced CKD.  UA neg, urine lytes c/w pre-renal.  11/20 transplant renal US normal/ no hydro.  IR consulted for [facility], R IJ placed 11/22; this was later removed.  CRRT stopped  on 11/21 Was getting  Lasix 80 mg IV BID transition to  lasix 80 mg PO daily today Got metolazone 5 mg once on 12/1 and 12/2 -    wrapping his legs Finally making headway from volume standpoint and getting some weights and ins/outs from the floor No emergent indication for dialysis.  Nonoliguric -  stable but poor BUN/Cr-   does not seem to be uremic.  We are adjusting diuretics as above.  I have discussed with the patient that if no improvement he may need dialysis -  but so far so good CKD stage IV of renal allograft - baseline Cr 3.3 - 3.6 (eGFR 18-20) per nephrology charting.  Follows with Dr. Marval Regal Renal transplant - done around 2010, cont prograf, myfortic and pred. Renal US stable.  Anemia of CKD - iron deficient - ferric gluconate x 2 doses.  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 Getting Austedo per primary  Dispo-  if has good UOP on oral diuretic and numbers not a lot worse tomorrow-  I would be OK with discharge to follow at [facility]  Lab 03/23/22 0550 03/24/22 0321 03/25/22 0248 03/26/22 0602 03/26/22 0606  HGB 7.7*  --   --  7.8*  --   ALBUMIN 2.9*   < > 2.9* 3.1*  --   CALCIUM 8.2*   < > 8.1* 8.2* 8.1*  PHOS 5.0*   < > 4.9* 5.4*  --   CREATININE 4.54*   < > 4.74* 4.95* 4.95*  K 4.4   < > 4.2 4.4 4.3   < > = values in this interval not displayed.   No results for input(s): "IRON", "TIBC", "FERRITIN" in the last 168 hours. Inpatient medications:  amLODipine  10 mg Oral Daily   atorvastatin  10 mg Oral Daily   carvedilol  12.5 mg Oral BID   darbepoetin (ARANESP) injection - NON-DIALYSIS  40 mcg Subcutaneous Q Sun-1800   Deutetrabenazine  6 mg Oral BID   feeding supplement (GLUCERNA SHAKE)  237 mL Oral BID BM   furosemide  80 mg Oral Daily   heparin  5,000 Units Subcutaneous Q8H    hydrALAZINE  50 mg Oral TID   insulin aspart  0-9 Units Subcutaneous TID WC   insulin aspart  5 Units Subcutaneous TID WC   insulin glargine-yfgn  12 Units Subcutaneous QHS   levothyroxine  112 mcg Oral Q0600   lipase/protease/amylase  72,000 Units Oral TID WC   melatonin  3 mg Oral QHS   multivitamin  1 tablet Oral QHS   mycophenolate  180 mg Oral BID   pantoprazole  40 mg Oral Daily   predniSONE  5 mg Oral Q breakfast   QUEtiapine  50 mg Oral QHS   senna  1 tablet Oral BID   tacrolimus  1.5 mg Oral BID    ferric gluconate (FERRLECIT) IVPB 250 mg (03/25/22 1035)    acetaminophen, albuterol, hydrALAZINE, naphazoline-glycerin, ondansetron **OR** ondansetron (ZOFRAN) IV, polyethylene glycol   Louis Meckel, MD 8:20 AM 03/26/2022

## 2022-03-26 NOTE — Progress Notes (Signed)
PROGRESS NOTE Nathan Tyler  UYQ:[id number] DOB: 1956-03-26 DOA: 03/09/2022 PCP: Celene Squibb, MD   Brief Narrative/Hospital Course: 66 year old male with PMH CKD4 s/p transplant, chorea, DM II, HTN, HLD, hypothyroidism who presented with SOB. He was recently been hospitalized from 11/1 - 11/8 as well for CAP and completed antibiotics in the hospital and was discharged home with home health. Seen in the ER, CXR was more concerning for volume overload and he was found to have worsened renal function.  He was evaluated by nephrology and admitted for acute hypoxic respiratory failure, acute metabolic encephalopathy and started on CRRT for fluid overload and acutely worsening renal failure on the CT of CKD stage IV in a transplant kidney.Patient was transferred from [facility] to [facility] 03/15/22. Followed by nephrology AKI improved back to baseline CK.Mental status improved> IR removed [facility] 11/27.  Patient is is having fluid overload nephrology diuresing  Initially plan was for CIR but he is doing well with mobility wise> PT recommending home with home health at this time.  Subjective: Seen and examined He has no complaint no shortness of breath leg edema is improving has stocking/wrap in place Bun/creat thsi am 94/4.9 abt the same UOP 1250 but had unmeasured uop x3-reports accident  Assessment and Plan: Principal Problem:   Acute renal failure superimposed on stage 4 chronic kidney disease (HCC) Active Problems:   Acute respiratory failure with hypoxia (HCC)   Acute on chronic anemia   DM2 (diabetes mellitus, type 2) (HCC)   Acute renal failure (HCC)   Malnutrition of moderate degree   AKI on CKD stage IV on transplant kidney Fluid overload with pulmonary edema and lower leg edema Status post renal transplant 2008-on tacrolimus, mycophenolate,prednisone UA was negative.Urine lites consistent with prerenal AKI,ultrasound normal. Creatinine peaked to 5.9, and  was having fluid overload and lower  leg edema,pulmonary edema-S/p CRRT 11/19-11/21-> Right TDC and foley removed 11/27.  Hypoxia resolved/leg edema improving continue Ace wrap, managed with IV Lasix 80 mg twice daily > changed to lasix 80 mg daily 12/4> repeat BMP in am. Continue immunosuppressants for transplant kidney.Appreciate nephrology input, if renal function stable by tomorrow potentially discharge  Recent Labs  Lab 03/23/22 0550 03/24/22 0321 03/25/22 0248 03/26/22 0602 03/26/22 0606  BUN 95* 92* 94* 94* 94*  CREATININE 4.54* 4.45* 4.74* 4.95* 4.95*    Intake/Output Summary (Last 24 hours) at 03/26/2022 0721 Last data filed at 03/26/2022 0500 Gross per 24 hour  Intake 731.48 ml  Output 1250 ml  Net -518.52 ml   Net IO Since Admission: -10,758.37 mL [03/26/22 0721]    Acute hypoxic respiratory failure due to pulmonary edema/fluid overload: Resolved   Urine retention needing in and out cath x1> reports he has been voiding well. Foley was removed on 11/27.  Measure output closely  Acute metabolic encephalopathy 2/2 sundowning hypoxia uremia renal failure,was on Precedex initially.  Mentation stable at baseline.  Continue Seroquel 50 bedtime  Anemia of CKD: Ferric gluconate ordered  on12/3 x2, on Aranesp every 2 week, monitor and transfuse if less than 7 gm. Recent Labs  Lab 03/23/22 0550 03/26/22 0602  HGB 7.7* 7.8*  HCT 24.7* 24.5*     DM2 with poorly controlled hyperglycemia:HbA1c 5.7.At home on NovoLog 5 units tid, Trulicity weekly.also on long-term steroid contributing to hyperglycemia.  At this time well controlled on Semglee  of 12u, SSI,NovoLog 5 units pre-mea, monitor as below Recent Labs  Lab 03/24/22 2306 03/25/22 0759 03/25/22 1138 03/25/22 1645 03/25/22 2102  GLUCAP 138* 99 99 149* 169*    HTN: Well-controlled on amlodipine 10 Coreg 12.5, hydralazine '50mg'$  tid HLD: On Lipitor Hypothyroidism: Continue home dose Synthroid Involuntary movements/chorea-chronic,continue home dose of Austedo.   On Seroquel.  Class II Obesity:Patient's Body mass index is 31.67 kg/m. : Will benefit with PCP follow-up, weight loss  healthy lifestyle and outpatient sleep evaluation.  Deconditioning/debility continue PT OT recommending CIR initially now improving plans for home with home health  Moderate malnutrition augment diet as below Nutrition Problem: Moderate Malnutrition Etiology: chronic illness (renal transplant 2008 with CKD 4 on chonic immunosuppression) Signs/Symptoms: mild muscle depletion, mild fat depletion Interventions: Liberalize Diet, Refer to RD note for recommendations, MVI, Ensure Enlive (each supplement provides 350kcal and 20 grams of protein)  DVT prophylaxis: heparin injection 5,000 Units Start: 03/09/22 2200 Code Status:   Code Status: DNR Family Communication: plan of care discussed with patient  Patient status is: Inpatient because of acute renal failure Level of care: Med-Surg  Dispo: The patient is from: home w/ wife            Anticipated disposition:  if renal function stable by tomorrow potentially discharge  TO Home with home health  Objective: Vitals last 24 hrs: Vitals:   03/25/22 1724 03/25/22 1956 03/26/22 0500 03/26/22 0525  BP: (!) 151/73 (!) 141/74  136/74  Pulse: 70 65  65  Resp: '16 16  16  '$ Temp: 98.3 F (36.8 C) 98.6 F (37 C)  97.8 F (36.6 C)  TempSrc: Oral Oral  Oral  SpO2: 92% 99%  96%  Weight:   103 kg   Height:       Weight change: -2.586 kg  Physical Examination: General exam: AAox3, weak,older appearing HEENT:Oral mucosa moist, Ear/Nose WNL grossly, dentition normal. Respiratory system: bilaterally clear BS, no use of accessory muscle Cardiovascular system: S1 & S2 +, regular rate, JVD neg. Gastrointestinal system: Abdomen soft, NT,ND,BS+ Nervous System:Alert, awake, moving extremities and grossly nonfocal Extremities: LE ankle edema +, but overall better with Ace wrap in place Skin: No rashes,no icterus. MSK: Normal muscle  bulk,tone, power   Medications reviewed:  Scheduled Meds:  amLODipine  10 mg Oral Daily   atorvastatin  10 mg Oral Daily   carvedilol  12.5 mg Oral BID   darbepoetin (ARANESP) injection - NON-DIALYSIS  40 mcg Subcutaneous Q Sun-1800   Deutetrabenazine  6 mg Oral BID   feeding supplement (GLUCERNA SHAKE)  237 mL Oral BID BM   furosemide  80 mg Oral Daily   heparin  5,000 Units Subcutaneous Q8H   hydrALAZINE  50 mg Oral TID   insulin aspart  0-9 Units Subcutaneous TID WC   insulin aspart  5 Units Subcutaneous TID WC   insulin glargine-yfgn  12 Units Subcutaneous QHS   levothyroxine  112 mcg Oral Q0600   lipase/protease/amylase  72,000 Units Oral TID WC   melatonin  3 mg Oral QHS   multivitamin  1 tablet Oral QHS   mycophenolate  180 mg Oral BID   pantoprazole  40 mg Oral Daily   predniSONE  5 mg Oral Q breakfast   QUEtiapine  50 mg Oral QHS   senna  1 tablet Oral BID   tacrolimus  1.5 mg Oral BID  Continuous Infusions:  ferric gluconate (FERRLECIT) IVPB 250 mg (03/25/22 1035)    Diet Order             Diet renal/carb modified with fluid restriction Diet-HS Snack? Nothing; Fluid restriction: 1200 mL  Fluid; Room service appropriate? Yes; Fluid consistency: Thin  Diet effective now                  Intake/Output Summary (Last 24 hours) at 03/26/2022 0721 Last data filed at 03/26/2022 0500 Gross per 24 hour  Intake 731.48 ml  Output 1250 ml  Net -518.52 ml    Net IO Since Admission: -10,758.37 mL [03/26/22 0721]  Wt Readings from Last 3 Encounters:  03/26/22 103 kg  02/22/22 104.3 kg  11/24/21 107 kg     Unresulted Labs (From admission, onward)     Start     Ordered   03/12/22 0500  Renal function panel (daily at 0500)  Daily at 5am,   R     Question:  Specimen collection method  Answer:  Lab=Lab collect   03/11/22 1626   03/12/22 0500  Magnesium  Daily at 5am,   R     Question:  Specimen collection method  Answer:  Lab=Lab collect   03/11/22 1626   Signed and  Held  CBC  (heparin)  Once,   R       Comments: Baseline for heparin therapy IF NOT ALREADY DRAWN.  Notify MD if PLT < 100 K.   Question:  Specimen collection method  Answer:  Lab=Lab collect   Signed and Held   Signed and Held  Creatinine, serum  (heparin)  Once,   R       Comments: Baseline for heparin therapy IF NOT ALREADY DRAWN.   Question:  Specimen collection method  Answer:  Lab=Lab collect   Signed and Held   Signed and Held  Comprehensive metabolic panel  Tomorrow morning,   R       Question:  Specimen collection method  Answer:  Lab=Lab collect   Signed and Held   Signed and Held  CBC with Differential/Platelet  Tomorrow morning,   R       Question:  Specimen collection method  Answer:  Lab=Lab collect   Signed and Held          Data Reviewed: I have personally reviewed following labs and imaging studies CBC: Recent Labs  Lab 03/23/22 0550 03/26/22 0602  WBC 5.8 5.1  HGB 7.7* 7.8*  HCT 24.7* 24.5*  MCV 95.4 93.9  PLT 218 812    Basic Metabolic Panel: Recent Labs  Lab 03/22/22 0734 03/23/22 0550 03/24/22 0321 03/25/22 0248 03/26/22 0602 03/26/22 0606  NA 139 139 140 139 140 138  K 4.4 4.4 4.3 4.2 4.4 4.3  CL 104 105 103 104 102 101  CO2 '25 24 27 26 27 25  '$ GLUCOSE 142* 186* 161* 169* 135* 133*  BUN 88* 95* 92* 94* 94* 94*  CREATININE 4.55* 4.54* 4.45* 4.74* 4.95* 4.95*  CALCIUM 8.4* 8.2* 8.4* 8.1* 8.2* 8.1*  MG 2.5* 2.4 2.5* 2.3 2.3  --   PHOS 4.4 5.0* 4.9* 4.9* 5.4*  --     No results found for this or any previous visit (from the past 240 hour(s)).  Antimicrobials: Anti-infectives (From admission, onward)    Start     Dose/Rate Route Frequency Ordered Stop   03/14/22 1315  ceFAZolin (ANCEF) IVPB 2g/100 mL premix        2 g 200 mL/hr over 30 Minutes Intravenous On call to O.R. 03/14/22 1300 03/14/22 1400   03/11/22 1600  levofloxacin (LEVAQUIN) IVPB 500 mg  Status:  Discontinued        500 mg 100 mL/hr over  60 Minutes Intravenous Every 48  hours 03/09/22 1808 03/11/22 1539   03/09/22 1600  levofloxacin (LEVAQUIN) IVPB 500 mg        500 mg 100 mL/hr over 60 Minutes Intravenous  Once 03/09/22 1551 03/09/22 1730      Culture/Microbiology    Component Value Date/Time   SDES BLOOD LEFT FOREARM 02/21/2022 2236   SPECREQUEST  02/21/2022 2236    BOTTLES DRAWN AEROBIC AND ANAEROBIC Blood Culture adequate volume   CULT  02/21/2022 2236    NO GROWTH 5 DAYS Performed at [facility], [place] [address], [place], [place] [address]    REPTSTATUS 02/26/2022 FINAL 02/21/2022 2236    Radiology Studies: No results found.   LOS: 17 days   Antonieta Pert, MD [facility]  03/26/2022, 7:21 AM

## 2022-03-26 NOTE — Progress Notes (Signed)
Inpatient Rehab Admissions Coordinator:    Pt. Currently transferring at supervision level and ambulated 500 ft with min guard assist during most recent PT session. Both PT/OT are recommending HH. CIR will follow to be sure he remains at that level, but he can likely just d/c home once medically stable.  Clemens Catholic, [place], [place] Admissions Coordinator  [phone] ([place]) [phone] (office)

## 2022-03-27 LAB — GLUCOSE, CAPILLARY
Glucose-Capillary: 127 mg/dL — ABNORMAL HIGH (ref 70–99)
Glucose-Capillary: 132 mg/dL — ABNORMAL HIGH (ref 70–99)
Glucose-Capillary: 185 mg/dL — ABNORMAL HIGH (ref 70–99)

## 2022-03-27 LAB — RENAL FUNCTION PANEL
Albumin: 3.2 g/dL — ABNORMAL LOW (ref 3.5–5.0)
Anion gap: 11 (ref 5–15)
BUN: 92 mg/dL — ABNORMAL HIGH (ref 8–23)
CO2: 26 mmol/L (ref 22–32)
Calcium: 8.3 mg/dL — ABNORMAL LOW (ref 8.9–10.3)
Chloride: 101 mmol/L (ref 98–111)
Creatinine, Ser: 5.08 mg/dL — ABNORMAL HIGH (ref 0.61–1.24)
GFR, Estimated: 12 mL/min — ABNORMAL LOW (ref >60)
Glucose, Bld: 143 mg/dL — ABNORMAL HIGH (ref 70–99)
Phosphorus: 5.1 mg/dL — ABNORMAL HIGH (ref 2.5–4.6)
Potassium: 4.2 mmol/L (ref 3.5–5.1)
Sodium: 138 mmol/L (ref 135–145)

## 2022-03-27 LAB — MAGNESIUM: Magnesium: 2.4 mg/dL (ref 1.7–2.4)

## 2022-03-27 MED ORDER — FUROSEMIDE 80 MG PO TABS: 80.0000 mg | ORAL_TABLET | Freq: Two times a day (BID) | ORAL | 0 refills | Status: DC

## 2022-03-27 MED ORDER — FUROSEMIDE 40 MG PO TABS
80.0000 mg | ORAL_TABLET | Freq: Two times a day (BID) | ORAL | Status: DC
  Administered 2022-03-27: 80 mg via ORAL
  Filled 2022-03-27: qty 2

## 2022-03-27 MED ORDER — QUETIAPINE FUMARATE 25 MG PO TABS: 25.0000 mg | ORAL_TABLET | Freq: Every day | ORAL | 0 refills | Status: DC

## 2022-03-27 MED ORDER — GLUCERNA SHAKE PO LIQD: 237.0000 mL | Freq: Two times a day (BID) | ORAL | 0 refills | Status: AC

## 2022-03-27 NOTE — Discharge Summary (Signed)
Physician Discharge Summary  Nathan Tyler UXN:[id number] DOB: 05-01-1955 DOA: 03/09/2022  PCP: Celene Squibb, MD  Admit date: 03/09/2022 Discharge date: 03/27/2022 Recommendations for Outpatient Follow-up:  Follow up with PCP in 1 weeks-call for appointment Follow-up with nephrology early next week to monitor renal function Please obtain BMP/CBC in one week Monitor blood sugar at home 4 times a day if more than 200 range follow-up with PCP to switch to long-acting insulin  Discharge Dispo: Home w/ [facility] Discharge Condition: Stable Code Status:   Code Status: DNR Diet recommendation:  Diet Order             Diet renal/carb modified with fluid restriction Diet-HS Snack? Nothing; Fluid restriction: 1200 mL Fluid; Room service appropriate? Yes; Fluid consistency: Thin  Diet effective now                    Brief/Interim Summary: 66 year old male with PMH CKD4 s/p transplant, chorea, DM II, HTN, HLD, hypothyroidism who presented with SOB. He was recently been hospitalized from 11/1 - 11/8 as well for CAP and completed antibiotics in the hospital and was discharged home with home health. Seen in the ER, CXR was more concerning for volume overload and he was found to have worsened renal function.  He was evaluated by nephrology and admitted for acute hypoxic respiratory failure, acute metabolic encephalopathy and started on CRRT for fluid overload and acutely worsening renal failure on the CT of CKD stage IV in a transplant kidney.Patient was transferred from [facility] to [facility] hospital 03/15/22. Followed by nephrology AKI improved back to baseline CK.Mental status improved> IR removed [facility] 11/27.  Patient is is having fluid overload nephrology diuresing  Initially plan was for CIR but he is doing well with mobility wise> PT recommending home with home health at this time. Nephrology has seen this morning , even though his numbers are not good but remains medically stable, advised to increase Lasix 80 mg  twice daily and okay for discharge home they will follow-up next week as outpatient. Patient has been ambulatory, and feels ready for discharge today    Discharge Diagnoses:  Principal Problem:   Acute renal failure superimposed on stage 4 chronic kidney disease ([place]) Active Problems:   Acute respiratory failure with hypoxia ([place])   Acute on chronic anemia   DM2 (diabetes mellitus, type 2) (HCC)   Acute renal failure (HCC)   Malnutrition of moderate degree   AKI on CKD stage IV on transplant kidney Fluid overload with pulmonary edema and lower leg edema Status post renal transplant 2008-on tacrolimus, mycophenolate,prednisone UA was negative.Urine lites consistent with prerenal AKI,ultrasound normal. Creatinine peaked to 5.9, and  was having fluid overload and lower leg edema,pulmonary edema-S/p CRRT 11/19-11/2, on Ace wrap, managed with IV Lasix 80 mg twice daily > changed to lasix 80 mg daily 12/4>Nephrology has seen this morning , even though his numbers are not good but remains medically stable, advised to increase Lasix 80 mg twice daily and okay for discharge home they will follow-up next week as outpatient Recent Labs  Lab 03/24/22 0321 03/25/22 0248 03/26/22 0602 03/26/22 0606 03/27/22 0549  BUN 92* 94* 94* 94* 92*  CREATININE 4.45* 4.74* 4.95* 4.95* 5.08*   Intake/Output Summary (Last 24 hours) at 03/27/2022 1054 Last data filed at 03/27/2022 0906 Gross per 24 hour  Intake 1027 ml  Output 1000 ml  Net 27 ml  Net IO Since Admission: -10,731.37 mL [03/27/22 1054]    Acute hypoxic respiratory  failure due to pulmonary edema/fluid overload: Resolved  Urine retention Foley was removed on 11/27.  Resolved Acute metabolic encephalopathy 2/2 sundowning hypoxia uremia renal failure,was on Precedex initially.  Currently aaox3 Anemia of CKD: Ferric gluconate ordered  on12/3 x2, on Aranesp every 2 week here.  Hemoglobin improved to 7.8 follow-up outpatient Recent Labs  Lab  03/23/22 0550 03/26/22 0602  HGB 7.7* 7.8*  HCT 24.7* 24.5*   DM2 with poorly controlled hyperglycemia:HbA1c 5.7.At home on NovoLog 5 units tid, Trulicity weekly.also on long-term steroid contributing to hyperglycemia.  Managed with long-term insulin [place] insulin sliding scale> I discussed with him regarding outpatient diabetes regimen he would like to continue on his home regimen including Trulicity but I advised that if sugar > 200 and he should consider switching to long-acting insulin and follow-up with PCP within few days Recent Labs  Lab 03/26/22 0747 03/26/22 1119 03/26/22 1550 03/26/22 2207 03/27/22 0735  GLUCAP 137* 117* 148* 172* 127*   HTN: Well-controlled on amlodipine 10 Coreg 12.5, hydralazine '50mg'$  tid HLD: On Lipitor Hypothyroidism: Continue home dose Synthroid Involuntary movements/chorea-chronic,continue home dose of [place].  On Seroquel.  Class II Obesity:Patient's Body mass index is 31.67 kg/m. : Will benefit with PCP follow-up, weight loss  healthy lifestyle and outpatient sleep evaluation.  Deconditioning/debility continue PT OT recommending CIR initially now improving plans for home with home health  Moderate malnutrition augment diet as below Nutrition Problem: Moderate Malnutrition Etiology: chronic illness (renal transplant 2008 with CKD 4 on chonic immunosuppression) Signs/Symptoms: mild muscle depletion, mild fat depletion Interventions: Liberalize Diet, Refer to RD note for recommendations, MVI, Ensure Enlive (each supplement provides 350kcal and 20 grams of protein)  Consults: Critical care IR Nephrology Subjective: Alert awake oriented resting comfortably legs edema much better on room air breathing well able to lay flat.  Discharge Exam: Vitals:   03/27/22 0504 03/27/22 0757  BP: (!) 150/78 138/71  Pulse: 64 (!) 58  Resp: 18 18  Temp: 97.6 F (36.4 C) (!) 97.3 F (36.3 C)  SpO2: 99% 96%   General: Pt is alert, awake, looks very old  for his age  Cardiovascular: RRR, S1/S2 +, no rubs, no gallops Respiratory: CTA bilaterally, no wheezing, no rhonchi Abdominal: Soft, NT, ND, bowel sounds + Extremities: no edema, no cyanosis  Discharge Instructions  Discharge Instructions     (HEART FAILURE PATIENTS) Call MD:  Anytime you have any of the following symptoms: 1) 3 pound weight gain in 24 hours or 5 pounds in 1 week 2) shortness of breath, with or without a dry hacking cough 3) swelling in the hands, feet or stomach 4) if you have to sleep on extra pillows at night in order to breathe.   Complete by: As directed    Discharge instructions   Complete by: As directed    Please call call MD or return to ER for similar or worsening recurring problem that brought you to hospital or if any fever,nausea/vomiting,abdominal pain, uncontrolled pain, chest pain,  shortness of breath or any other alarming symptoms.  Please follow-up your doctor as instructed in a week time and call the office for appointment.  Please avoid alcohol, smoking, or any other illicit substance and maintain healthy habits including taking your regular medications as prescribed.  You were cared for by a hospitalist during your hospital stay. If you have any questions about your discharge medications or the care you received while you were in the hospital after you are discharged, you can call the unit  and ask to speak with the hospitalist on call if the hospitalist that took care of you is not available.  Once you are discharged, your primary care physician will handle any further medical issues. Please note that NO REFILLS for any discharge medications will be authorized once you are discharged, as it is imperative that you return to your primary care physician (or establish a relationship with a primary care physician if you do not have one) for your aftercare needs so that they can reassess your need for medications and monitor your lab values  Check blood sugar  3 times a day and bedtime at home. If blood sugar running above 200 less than 70 please call your MD to adjust insulin. If blood sugars running less 100 do not use insulin and call MD. If you noticed signs and symptoms of hypoglycemia or low blood sugar like jitteriness, confusion, thirst, tremor, sweating- Check blood sugar, drink sugary drink/biscuits/sweets to increase sugar level and call MD or return to ER.   Increase activity slowly   Complete by: As directed       Allergies as of 03/27/2022   No Known Allergies      Medication List     TAKE these medications    amLODipine 10 MG tablet Commonly known as: NORVASC Take 10 mg by mouth daily.   atorvastatin 10 MG tablet Commonly known as: LIPITOR Take 10 mg by mouth daily.   Austedo 6 MG Tabs Generic drug: Deutetrabenazine Take 6 mg by mouth 2 (two) times daily.   carvedilol 12.5 MG tablet Commonly known as: COREG Take 12.5 mg by mouth 2 (two) times daily.   feeding supplement (GLUCERNA SHAKE) Liqd Take 237 mLs by mouth 2 (two) times daily between meals for 7 days.   furosemide 80 MG tablet Commonly known as: LASIX Take 1 tablet (80 mg total) by mouth 2 (two) times daily. What changed:  medication strength how much to take when to take this   hydrALAZINE 50 MG tablet Commonly known as: APRESOLINE Take 50 mg by mouth 3 (three) times daily.   levothyroxine 112 MCG tablet Commonly known as: SYNTHROID Take 112 mcg by mouth daily.   lipase/protease/amylase 36000 UNITS Cpep capsule Commonly known as: Creon Take 2 capsules (72,000 Units total) by mouth 3 (three) times daily with meals. Take 1 with snacks   mycophenolate 180 MG EC tablet Commonly known as: MYFORTIC Take 1 tablet (180 mg total) by mouth 2 (two) times daily.   Omnipod DASH Pods (Gen 4) Misc Inject 5 Units into the skin 3 (three) times daily. Medication: Novolog family member states usually runs about 5 units TID depending on his sliding scale    predniSONE 5 MG tablet Commonly known as: DELTASONE Take 1 tablet (5 mg total) by mouth daily with breakfast. restart '5mg'$  dose 3/10 after completing prednisone taper (2/24 - 3/9)   QUEtiapine 25 MG tablet Commonly known as: SEROQUEL Take 1 tablet (25 mg total) by mouth at bedtime for 5 days.   sodium bicarbonate 650 MG tablet Take 650 mg by mouth 2 (two) times daily.   tacrolimus 0.5 MG capsule Commonly known as: PROGRAF Take 1.5 mg by mouth 2 (two) times daily.   Trulicity 1.5 OJ/5.0KX Sopn Generic drug: Dulaglutide Inject 1.5 mg into the skin every 7 (seven) days.        Follow-up Information     [facility] Follow up.   Specialty: Home Health Services Why: Al[facility]ings will be providing  home health services including PT/OT.  They will call you in the next 24- 48 hours to start services. Contact information: [place] [address] [phone]         Celene Squibb, MD Follow up in 1 week(s).   Specialty: Internal Medicine Contact information: [place] [facility] [address] [phone]         Corliss Parish, MD Follow up in 1 week(s).   Specialty: Nephrology Why: For renal function monitoring early next week Contact information: [place] [address] [phone]                No Known Allergies  The results of significant diagnostics from this hospitalization (including imaging, microbiology, ancillary and laboratory) are listed below for reference.    Microbiology: No results found for this or any previous visit (from the past 240 hour(s)).  Procedures/Studies: IR Removal Tun Cv Cath W/O FL  Result Date: 03/19/2022 INDICATION: Patient with AKI on CKD status post tunneled hemodialysis catheter placement 03/14/2022, who presents with improved renal function, no longer requires hemodialysis. Request for tunneled hemodialysis catheter removal. EXAM: REMOVAL OF TUNNELED HEMODIALYSIS  CATHETER MEDICATIONS: None COMPLICATIONS: None immediate. PROCEDURE: Informed written consent was obtained from the patient following an explanation of the procedure, risks, benefits and alternatives to treatment. A time out was performed prior to the initiation of the procedure. Sterile technique was utilized including mask, sterile gloves, sterile drape, and hand hygiene. ChloraPrep was used to prep the patient's right neck, chest and existing catheter. The catheter was removed intact by applying manual retraction. Hemostasis was obtained with manual compression. A dressing was placed. The patient tolerated the procedure well without immediate post procedural complication. IMPRESSION: Successful removal of a RIGHT chest tunneled dialysis catheter. Read by: Durenda Guthrie, PA-C Electronically Signed   By: Michaelle Birks M.D.   On: 03/19/2022 15:12   IR TUNNELED CENTRAL VENOUS CATHETER PLACEMENT  Result Date: 03/14/2022 INDICATION: Acute on chronic renal failure. Request for a tunneled dialysis catheter. EXAM: FLUOROSCOPIC AND ULTRASOUND GUIDED PLACEMENT OF A TUNNELED DIALYSIS CATHETER Physician: Stephan Minister. Anselm Pancoast, MD MEDICATIONS: Ancef 2 g; The antibiotic was administered within an appropriate time interval prior to skin puncture. ANESTHESIA/SEDATION: None FLUOROSCOPY TIME:  Radiation Exposure Index (as provided by the fluoroscopic device): 6.7 mGy Kerma COMPLICATIONS: None immediate. PROCEDURE: The procedure was explained to the patient. The risks and benefits of the procedure were discussed and the patient's questions were addressed. Informed consent was obtained from the patient. The patient was placed supine on the interventional table. Ultrasound confirmed a patent right internal jugular vein. Ultrasound image obtained for documentation. The right neck and chest was prepped and draped in a sterile fashion. Maximal barrier sterile technique was utilized including caps, mask, sterile gowns, sterile gloves, sterile  drape, hand hygiene and skin antiseptic. The right neck was anesthetized with 1% lidocaine. A small incision was made with #11 blade scalpel. A 21 gauge needle directed into the right internal jugular vein with ultrasound guidance. A micropuncture dilator set was placed. A 23 cm tip to cuff Palindrome catheter was selected. The skin below the right clavicle was anesthetized and a small incision was made with an #11 blade scalpel. A subcutaneous tunnel was formed to the vein dermatotomy site. The catheter was brought through the tunnel. The vein dermatotomy site was dilated to accommodate a peel-away sheath. The catheter was placed through the peel-away sheath and directed into the central venous structures.  The tip of the catheter was placed at superior cavoatrial junction with fluoroscopy. Fluoroscopic images were obtained for documentation. Both lumens were found to aspirate and flush well. The proper amount of heparin was flushed in both lumens. The vein dermatotomy site was closed using a single layer of absorbable suture and Dermabond. Gel-Foam placed in the subcutaneous tract. The catheter was secured to the skin using Prolene suture. IMPRESSION: Successful placement of a right jugular tunneled dialysis catheter using ultrasound and fluoroscopic guidance. Electronically Signed   By: Markus Daft M.D.   On: 03/14/2022 15:04   IR US Guide Vasc Access Right  Result Date: 03/14/2022 INDICATION: Acute on chronic renal failure. Request for a tunneled dialysis catheter. EXAM: FLUOROSCOPIC AND ULTRASOUND GUIDED PLACEMENT OF A TUNNELED DIALYSIS CATHETER Physician: Stephan Minister. Anselm Pancoast, MD MEDICATIONS: Ancef 2 g; The antibiotic was administered within an appropriate time interval prior to skin puncture. ANESTHESIA/SEDATION: None FLUOROSCOPY TIME:  Radiation Exposure Index (as provided by the fluoroscopic device): 6.7 mGy Kerma COMPLICATIONS: None immediate. PROCEDURE: The procedure was explained to the patient. The risks and  benefits of the procedure were discussed and the patient's questions were addressed. Informed consent was obtained from the patient. The patient was placed supine on the interventional table. Ultrasound confirmed a patent right internal jugular vein. Ultrasound image obtained for documentation. The right neck and chest was prepped and draped in a sterile fashion. Maximal barrier sterile technique was utilized including caps, mask, sterile gowns, sterile gloves, sterile drape, hand hygiene and skin antiseptic. The right neck was anesthetized with 1% lidocaine. A small incision was made with #11 blade scalpel. A 21 gauge needle directed into the right internal jugular vein with ultrasound guidance. A micropuncture dilator set was placed. A 23 cm tip to cuff Palindrome catheter was selected. The skin below the right clavicle was anesthetized and a small incision was made with an #11 blade scalpel. A subcutaneous tunnel was formed to the vein dermatotomy site. The catheter was brought through the tunnel. The vein dermatotomy site was dilated to accommodate a peel-away sheath. The catheter was placed through the peel-away sheath and directed into the central venous structures. The tip of the catheter was placed at superior cavoatrial junction with fluoroscopy. Fluoroscopic images were obtained for documentation. Both lumens were found to aspirate and flush well. The proper amount of heparin was flushed in both lumens. The vein dermatotomy site was closed using a single layer of absorbable suture and Dermabond. Gel-Foam placed in the subcutaneous tract. The catheter was secured to the skin using Prolene suture. IMPRESSION: Successful placement of a right jugular tunneled dialysis catheter using ultrasound and fluoroscopic guidance. Electronically Signed   By: Markus Daft M.D.   On: 03/14/2022 15:04   IR Fluoro Guide CV Line Right  Result Date: 03/14/2022 INDICATION: Acute on chronic renal failure. Request for a  tunneled dialysis catheter. EXAM: FLUOROSCOPIC AND ULTRASOUND GUIDED PLACEMENT OF A TUNNELED DIALYSIS CATHETER Physician: Stephan Minister. Anselm Pancoast, MD MEDICATIONS: Ancef 2 g; The antibiotic was administered within an appropriate time interval prior to skin puncture. ANESTHESIA/SEDATION: None FLUOROSCOPY TIME:  Radiation Exposure Index (as provided by the fluoroscopic device): 6.7 mGy Kerma COMPLICATIONS: None immediate. PROCEDURE: The procedure was explained to the patient. The risks and benefits of the procedure were discussed and the patient's questions were addressed. Informed consent was obtained from the patient. The patient was placed supine on the interventional table. Ultrasound confirmed a patent right internal jugular vein. Ultrasound image obtained for documentation. The right  neck and chest was prepped and draped in a sterile fashion. Maximal barrier sterile technique was utilized including caps, mask, sterile gowns, sterile gloves, sterile drape, hand hygiene and skin antiseptic. The right neck was anesthetized with 1% lidocaine. A small incision was made with #11 blade scalpel. A 21 gauge needle directed into the right internal jugular vein with ultrasound guidance. A micropuncture dilator set was placed. A 23 cm tip to cuff Palindrome catheter was selected. The skin below the right clavicle was anesthetized and a small incision was made with an #11 blade scalpel. A subcutaneous tunnel was formed to the vein dermatotomy site. The catheter was brought through the tunnel. The vein dermatotomy site was dilated to accommodate a peel-away sheath. The catheter was placed through the peel-away sheath and directed into the central venous structures. The tip of the catheter was placed at superior cavoatrial junction with fluoroscopy. Fluoroscopic images were obtained for documentation. Both lumens were found to aspirate and flush well. The proper amount of heparin was flushed in both lumens. The vein dermatotomy site was  closed using a single layer of absorbable suture and Dermabond. Gel-Foam placed in the subcutaneous tract. The catheter was secured to the skin using Prolene suture. IMPRESSION: Successful placement of a right jugular tunneled dialysis catheter using ultrasound and fluoroscopic guidance. Electronically Signed   By: Markus Daft M.D.   On: 03/14/2022 15:04   US Renal Transplant w/Doppler  Result Date: 03/12/2022 CLINICAL DATA:  Acute kidney injury. EXAM: ULTRASOUND OF RENAL TRANSPLANT WITH RENAL DOPPLER ULTRASOUND TECHNIQUE: Ultrasound examination of the renal transplant was performed with gray-scale, color and duplex doppler evaluation. COMPARISON:  Renal ultrasound 12/27/2021 FINDINGS: Transplant kidney location: Right lower quadrant Transplant Kidney: Renal measurements: 11.2 x 6.7 x 5.1 cm = volume: 200 mL. Normal in size and parenchymal echogenicity. No hydronephrosis. Possible 1.6 cm cyst peripherally. No peri-transplant fluid collection seen. Color flow in the main renal artery:  Yes Color flow in the main renal vein:  Yes Duplex Doppler Evaluation: Main Renal Artery Velocity: 84 cm/sec Main Renal Artery Resistive Index: 0.8 Venous waveform in main renal vein:  Present. Intrarenal resistive index in upper pole:  0.7 (normal 0.6-0.8; equivocal 0.8-0.9; abnormal >= 0.9) Intrarenal resistive index in lower pole: 0.7 (normal 0.6-0.8; equivocal 0.8-0.9; abnormal >= 0.9) Bladder: Decompressed by Foley catheter. Other findings:  None. IMPRESSION: Possible small cyst. Otherwise unremarkable sonographic appearance of the right lower quadrant renal transplant. No transplant hydronephrosis. Electronically Signed   By: Keith Rake M.D.   On: 03/12/2022 15:47   DG CHEST PORT 1 VIEW  Result Date: 03/11/2022 CLINICAL DATA:  Central line placement. EXAM: PORTABLE CHEST 1 VIEW COMPARISON:  Radiograph 03/09/2022, CT 02/22/2022 FINDINGS: Right internal jugular central venous catheter tip projects over the right  clavicle. This is in the region of the distal jugular vein or brachiocephalic confluence. No pneumothorax. Low lung volumes. Similar appearance of heterogeneous bilateral lung opacities. Stable cardiomegaly. Suspected small pleural effusions. IMPRESSION: 1. Right internal jugular central venous catheter with tip projecting over the right clavicle, in the region of the distal jugular vein or brachiocephalic confluence. No pneumothorax. 2. Stable bilateral heterogeneous opacities which may represent pulmonary edema, infection or combination there of. Stable cardiomegaly and pleural effusions. Electronically Signed   By: Keith Rake M.D.   On: 03/11/2022 17:13   ECHOCARDIOGRAM COMPLETE  Result Date: 03/10/2022    ECHOCARDIOGRAM REPORT   Patient Name:   Nathan Tyler Date of Exam: 03/10/2022 Medical Rec #:  [id number]    Height:       72.0 in Accession #:    [id number]   Weight:       229.3 lb Date of Birth:  [birth year]    BSA:          2.258 m Patient Age:    36 years     BP:           144/67 mmHg Patient Gender: M            HR:           67 bpm. Exam Location:  Inpatient Procedure: 2D Echo, Cardiac Doppler and Color Doppler Indications:    CHF  History:        Patient has prior history of Echocardiogram examinations, most                 recent 06/08/2019. Arrythmias:Abnormal ECG.  Sonographer:    Melissa Morford RDCS (AE, PE) Referring Phys: [id number] [place]  1. Left ventricular ejection fraction, by estimation, is 55 to 60%. The left ventricle has normal function. The left ventricle has no regional wall motion abnormalities. Left ventricular diastolic parameters are consistent with Grade II diastolic dysfunction (pseudonormalization). Elevated left ventricular end-diastolic pressure.  2. Right ventricular systolic function is mildly reduced. The right ventricular size is mildly enlarged.  3. Left atrial size was moderately dilated.  4. The mitral valve is abnormal. Trivial mitral valve  regurgitation. No evidence of mitral stenosis.  5. The aortic valve is tricuspid. There is mild calcification of the aortic valve. There is mild thickening of the aortic valve. Aortic valve regurgitation is not visualized. Aortic valve sclerosis is present, with no evidence of aortic valve stenosis.  6. The inferior vena cava is normal in size with greater than 50% respiratory variability, suggesting right atrial pressure of 3 mmHg. FINDINGS  Left Ventricle: Left ventricular ejection fraction, by estimation, is 55 to 60%. The left ventricle has normal function. The left ventricle has no regional wall motion abnormalities. The left ventricular internal cavity size was normal in size. There is  no left ventricular hypertrophy. Left ventricular diastolic parameters are consistent with Grade II diastolic dysfunction (pseudonormalization). Elevated left ventricular end-diastolic pressure. Right Ventricle: The right ventricular size is mildly enlarged. Right vetricular wall thickness was not assessed. Right ventricular systolic function is mildly reduced. Left Atrium: Left atrial size was moderately dilated. Right Atrium: Right atrial size was normal in size. Pericardium: There is no evidence of pericardial effusion. Mitral Valve: The mitral valve is abnormal. There is mild thickening of the mitral valve leaflet(s). Trivial mitral valve regurgitation. No evidence of mitral valve stenosis. Tricuspid Valve: The tricuspid valve is normal in structure. Tricuspid valve regurgitation is trivial. No evidence of tricuspid stenosis. Aortic Valve: The aortic valve is tricuspid. There is mild calcification of the aortic valve. There is mild thickening of the aortic valve. Aortic valve regurgitation is not visualized. Aortic valve sclerosis is present, with no evidence of aortic valve stenosis. Pulmonic Valve: The pulmonic valve was normal in structure. Pulmonic valve regurgitation is trivial. No evidence of pulmonic stenosis. Aorta:  The aortic root is normal in size and structure. Venous: The inferior vena cava is normal in size with greater than 50% respiratory variability, suggesting right atrial pressure of 3 mmHg. IAS/Shunts: No atrial level shunt detected by color flow Doppler.  LEFT VENTRICLE PLAX 2D LVIDd:         5.50 cm   Diastology LVIDs:  3.80 cm   LV e' medial:    58.71 cm/s LV PW:         1.10 cm   LV E/e' medial:  1.9 LV IVS:        1.10 cm   LV e' lateral:   6.53 cm/s LVOT diam:     2.20 cm   LV E/e' lateral: 17.3 LV SV:         60 LV SV Index:   27 LVOT Area:     3.80 cm  RIGHT VENTRICLE RV S prime:     12.70 cm/s TAPSE (M-mode): 2.3 cm LEFT ATRIUM             Index        RIGHT ATRIUM           Index LA diam:        4.70 cm 2.08 cm/m   RA Area:     17.20 cm LA Vol (A2C):   53.4 ml 23.65 ml/m  RA Volume:   43.70 ml  19.35 ml/m LA Vol (A4C):   48.8 ml 21.61 ml/m LA Biplane Vol: 53.0 ml 23.47 ml/m  AORTIC VALVE LVOT Vmax:   86.70 cm/s LVOT Vmean:  60.500 cm/s LVOT VTI:    0.158 m  AORTA Ao Root diam: 3.30 cm Ao Asc diam:  3.40 cm MITRAL VALVE                TRICUSPID VALVE MV Area (PHT): 4.31 cm     TR Peak grad:   17.3 mmHg MV Decel Time: 176 msec     TR Vmax:        208.00 cm/s MV E velocity: 113.00 cm/s MV A velocity: 71.10 cm/s   SHUNTS MV E/A ratio:  1.59         Systemic VTI:  0.16 m                             Systemic Diam: 2.20 cm Jenkins Rouge MD Electronically signed by Jenkins Rouge MD Signature Date/Time: 03/10/2022/11:48:19 AM    Final    DG Chest 2 View  Result Date: 03/09/2022 CLINICAL DATA:  SOB EXAM: CHEST - 2 VIEW COMPARISON:  02/21/22 FINDINGS: Very low lung volumes. There bilateral patchy airspace opacities which may represent a combination of bronchovascular crowding, pulmonary edema, and possible multifocal infection. Possible trace left pleural effusion. Cardiac and mediastinal contours are enlarged and likely unchanged when accounting for differences in lung volumes. Visualized upper  abdomen is unremarkable. The lateral views essentially nondiagnostic, but the vertebral body heights appear maintained. IMPRESSION: Very low lung volumes. Bilateral patchy airspace opacities may represent a combination of bronchovascular crowding, pulmonary edema, and possible multifocal infection. Electronically Signed   By: Marin Roberts M.D.   On: 03/09/2022 13:28    Labs: BNP (last 3 results) Recent Labs    03/10/22 0401 03/11/22 0244 03/12/22 0301  BNP 1,519.0* 2,114.8* 062.3*   Basic Metabolic Panel: Recent Labs  Lab 03/23/22 0550 03/24/22 0321 03/25/22 0248 03/26/22 0602 03/26/22 0606 03/27/22 0549  NA 139 140 139 140 138 138  K 4.4 4.3 4.2 4.4 4.3 4.2  CL 105 103 104 102 101 101  CO2 '24 27 26 27 25 26  '$ GLUCOSE 186* 161* 169* 135* 133* 143*  BUN 95* 92* 94* 94* 94* 92*  CREATININE 4.54* 4.45* 4.74* 4.95* 4.95* 5.08*  CALCIUM 8.2* 8.4* 8.1* 8.2* 8.1* 8.3*  MG 2.4 2.5* 2.3 2.3  --  2.4  PHOS 5.0* 4.9* 4.9* 5.4*  --  5.1*   Liver Function Tests: Recent Labs  Lab 03/23/22 0550 03/24/22 0321 03/25/22 0248 03/26/22 0602 03/27/22 0549  ALBUMIN 2.9* 2.9* 2.9* 3.1* 3.2*   No results for input(s): "LIPASE", "AMYLASE" in the last 168 hours. No results for input(s): "AMMONIA" in the last 168 hours. CBC: Recent Labs  Lab 03/23/22 0550 03/26/22 0602  WBC 5.8 5.1  HGB 7.7* 7.8*  HCT 24.7* 24.5*  MCV 95.4 93.9  PLT 218 236   Cardiac Enzymes: No results for input(s): "CKTOTAL", "CKMB", "CKMBINDEX", "TROPONINI" in the last 168 hours. BNP: Invalid input(s): "POCBNP" CBG: Recent Labs  Lab 03/26/22 0747 03/26/22 1119 03/26/22 1550 03/26/22 2207 03/27/22 0735  GLUCAP 137* 117* 148* 172* 127*   D-Dimer No results for input(s): "DDIMER" in the last 72 hours. Hgb A1c No results for input(s): "HGBA1C" in the last 72 hours. Lipid Profile No results for input(s): "CHOL", "HDL", "LDLCALC", "TRIG", "CHOLHDL", "LDLDIRECT" in the last 72 hours. Thyroid function  studies No results for input(s): "TSH", "T4TOTAL", "T3FREE", "THYROIDAB" in the last 72 hours.  Invalid input(s): "FREET3" Anemia work up No results for input(s): "VITAMINB12", "FOLATE", "FERRITIN", "TIBC", "IRON", "RETICCTPCT" in the last 72 hours. Urinalysis    Component Value Date/Time   COLORURINE YELLOW 03/10/2022 2229   APPEARANCEUR HAZY (A) 03/10/2022 2229   LABSPEC 1.013 03/10/2022 2229   PHURINE 5.0 03/10/2022 2229   GLUCOSEU NEGATIVE 03/10/2022 2229   HGBUR NEGATIVE 03/10/2022 2229   BILIRUBINUR NEGATIVE 03/10/2022 [place] 03/10/2022 2229   PROTEINUR 100 (A) 03/10/2022 2229   NITRITE NEGATIVE 03/10/2022 2229   LEUKOCYTESUR NEGATIVE 03/10/2022 2229   Sepsis Labs Recent Labs  Lab 03/23/22 0550 03/26/22 0602  WBC 5.8 5.1   Microbiology No results found for this or any previous visit (from the past 240 hour(s)).  Time coordinating discharge: 35 minutes  SIGNED: Antonieta Pert, MD  [facility] 03/27/2022, 10:54 AM  If 7PM-7AM, please contact night-coverage [email or web address]

## 2022-03-27 NOTE — Progress Notes (Signed)
[facility] Progress Note  Subjective:   had at least 1000 UOP.   Feels really good-  walking in the halls.  BUN and crt pretty stable  Vitals:   03/26/22 1552 03/26/22 2236 03/27/22 0504 03/27/22 0757  BP: (!) 133/58 (!) 140/54 (!) 150/78 138/71  Pulse: 60 (!) 54 64 (!) 58  Resp: _0 Temp: 97.7 F (36.5 C) (!) 97.5 F (36.4 C) 97.6 F (36.4 C) (!) 97.3 F (36.3 C)  TempSrc: Oral Oral Oral Oral  SpO2: 97% 98% 99% 96%  Weight:      Height:        Physical Exam:      General adult male in bed in no acute distress HEENT normocephalic atraumatic extraocular movements intact sclera anicteric Neck supple trachea midline Lungs reduced breath sounds normal work of breathing at rest on room air  Heart S1S2 no rub Abdomen soft nontender nondistended Extremities 1-2+ edema lower extremities; his legs are wrapped Psych normal mood and affect Neuro - alert and oriented and conversant; provides hx and follows commands; involuntary chorea movements (at baseline)  GU -  no foley       Date                           Creat               eGFR   2017-2018                 1.11- 1.56   2020                          1.88   2021                          1.54- 2.09   Sept 2023                  3.56                 18 ml/min    11/1- 02/28/22           4.38 >> 3.28    AKI on CKD4, 14 >> 20 ml/min    11/17                                    5.90                 10     11/18                                    5.78                 10       UA negative    UNa <10, UCr 151        Assessment/ Plan: 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 Likely ATN in setting of advanced CKD.  UA neg, urine lytes c/w pre-renal.  11/20 transplant renal US normal/ no hydro.  IR consulted for [facility], R IJ placed 11/22; this was later  removed.  CRRT stopped on 11/21 Was getting   Lasix 80 mg IV BID transition to lasix 80 mg PO daily -  I think needs a little more Got metolazone 5 mg once on 12/1 and 12/2 -    wrapping his legs Finally making headway from volume standpoint and getting some weights and ins/outs from the floor No emergent indication for dialysis.  Nonoliguric -  stable but poor BUN/Cr-   does not seem to be uremic.  We are adjusting diuretics as above.  I have discussed with the patient that if no improvement he may need dialysis -  but so far so good CKD stage IV of renal allograft - baseline Cr 3.3 - 3.6 (eGFR 18-20) per nephrology charting.  Follows with Dr. Marval Regal Renal transplant - done around 2010, cont prograf, myfortic and pred. Renal US stable.  Anemia of CKD - iron deficient - ferric gluconate x 2 doses.  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 Getting [place] per primary  Dispo-  even though his numbers are not good-  I think we can continue to follow this as OP-  will increase lasix to 80 BID and get him follow up next week at [facility].  I trust him to follow up and call if things are not right      Recent Labs  Lab 03/23/22 0550 03/24/22 0321 03/26/22 0602 03/26/22 0606 03/27/22 0549  HGB 7.7*  --  7.8*  --   --   ALBUMIN 2.9*   < > 3.1*  --  3.2*  CALCIUM 8.2*   < > 8.2* 8.1* 8.3*  PHOS 5.0*   < > 5.4*  --  5.1*  CREATININE 4.54*   < > 4.95* 4.95* 5.08*  K 4.4   < > 4.4 4.3 4.2   < > = values in this interval not displayed.   No results for input(s): "IRON", "TIBC", "FERRITIN" in the last 168 hours. Inpatient medications:  amLODipine  10 mg Oral Daily   atorvastatin  10 mg Oral Daily   carvedilol  12.5 mg Oral BID   darbepoetin (ARANESP) injection - NON-DIALYSIS  40 mcg Subcutaneous Q Sun-1800    Deutetrabenazine  6 mg Oral BID   feeding supplement (GLUCERNA SHAKE)  237 mL Oral BID BM   furosemide  80 mg Oral Daily   heparin  5,000 Units Subcutaneous Q8H   hydrALAZINE  50 mg Oral TID   insulin aspart  0-9 Units Subcutaneous TID WC   insulin aspart  5 Units Subcutaneous TID WC   insulin glargine-yfgn  12 Units Subcutaneous QHS   levothyroxine  112 mcg Oral Q0600   lipase/protease/amylase  72,000 Units Oral TID WC   melatonin  3 mg Oral QHS   multivitamin  1 tablet Oral QHS   mycophenolate  180 mg Oral BID   pantoprazole  40 mg Oral Daily   predniSONE  5 mg Oral Q breakfast   QUEtiapine  50 mg Oral QHS   senna  1 tablet Oral BID   tacrolimus  1.5 mg Oral BID      acetaminophen, albuterol, hydrALAZINE, naphazoline-glycerin, ondansetron **OR** ondansetron (ZOFRAN) IV, polyethylene glycol   Louis Meckel, MD  9:38 AM 03/27/2022

## 2022-03-27 NOTE — Progress Notes (Signed)
Nutrition Follow-up  DOCUMENTATION CODES:   Non-severe (moderate) malnutrition in context of chronic illness  INTERVENTION:  - Continue Renal/CHO diet, 1200 mL fluid restriction.   - Continue Glucerna BID.   NUTRITION DIAGNOSIS:   Moderate Malnutrition related to chronic illness (renal transplant 2008 with CKD 4 on chonic immunosuppression) as evidenced by mild muscle depletion, mild fat depletion.  GOAL:   Patient will meet greater than or equal to 90% of their needs  MONITOR:   Weight trends, Supplement acceptance, PO intake, Labs  REASON FOR ASSESSMENT:   Malnutrition Screening Tool    ASSESSMENT:   66 y.o. male with PMH of renal transplantation 2008, chronically immunosuppressed, CKD 4, DM 2 on insulin pump, HTN, HLD, hypothyroidism, movement disorder  Recently hospitalized 11/1-11/8 for community-acquired pneumonia who presented with SOB.  Meds include: lasix, sliding scale insulin, semglee (12 units), Rena-vit, prednisone, senokot. Labs reviewed: BUN/Crt elevated, Phos elevated.   Pt ate 75% of his breakfast this am. Pt continues with PO intakes of 75-100%. Pt is meeting his needs. Will continue to monitor.   Diet Order:   Diet Order             Diet renal/carb modified with fluid restriction Diet-HS Snack? Nothing; Fluid restriction: 1200 mL Fluid; Room service appropriate? Yes; Fluid consistency: Thin  Diet effective now                   EDUCATION NEEDS:   No education needs have been identified at this time  Skin:  Skin Assessment: Reviewed RN Assessment  Last BM:  03/24/22  Height:   Ht Readings from Last 1 Encounters:  03/15/22 '5\' 11"'$  (1.803 m)    Weight:   Wt Readings from Last 1 Encounters:  03/26/22 103 kg    Ideal Body Weight:     BMI:  Body mass index is 31.67 kg/m.  Estimated Nutritional Needs:   Kcal:  2600-2900 kcal  Protein:  145-165 grams  Fluid:  >/=2.6L  Cherelle Midkiff Graciela Husbands, RD, LDN, CNSC

## 2022-03-27 NOTE — Progress Notes (Signed)
Occupational Therapy Treatment Patient Details Name: Nathan Tyler MRN: [id number] DOB: October 19, 1955 Today's Date: 03/27/2022   History of present illness 66 y.o. male admitted to [facility] 11/17 with dyspnea, weakness, respiratory failure and acute worsening renal failure. CRRT 11/19-11/21. Transferred to [facility] 11/23.  PMhx: renal transplant with CKD, T2DM, HTN, hypothyroidism,  movement disorder, recent admission 11/1-11/8/23 for CAP   OT comments  Patient received in supine and was able to get to EOB without assistance. Patient asked to go for a walk before performing self care tasks and performed mobility in hallway with RW and supervision. Patient able to stand at sink for grooming and performed mobility and toilet transfers in room without an assistive device and supervision. Patient able to doff socks but required min assist to donn sock on right foot. Discharge recommendations continue to be appropriate.    Recommendations for follow up therapy are one component of a multi-disciplinary discharge planning process, led by the attending physician.  Recommendations may be updated based on patient status, additional functional criteria and insurance authorization.    Follow Up Recommendations  Home health OT     Assistance Recommended at Discharge Frequent or constant Supervision/Assistance  Patient can return home with the following  A little help with walking and/or transfers;Assistance with cooking/housework;Direct supervision/assist for medications management;Direct supervision/assist for financial management;Assist for transportation;Help with stairs or ramp for entrance;A little help with bathing/dressing/bathroom   Equipment Recommendations  None recommended by OT    Recommendations for Other Services      Precautions / Restrictions Precautions Precautions: Fall Restrictions Weight Bearing Restrictions: No       Mobility Bed Mobility Overal bed mobility: Needs Assistance Bed  Mobility: Supine to Sit     Supine to sit: Supervision, HOB elevated     General bed mobility comments: able to get to EOB without assistance    Transfers Overall transfer level: Modified independent Equipment used: Rolling walker (2 wheels), None Transfers: Sit to/from Stand Sit to Stand: Supervision           General transfer comment: performed mobility and transfers in room without assistive device and RW in hallway     Balance Overall balance assessment: Needs assistance Sitting-balance support: No upper extremity supported, Feet supported Sitting balance-Leahy Scale: Good     Standing balance support: No upper extremity supported, During functional activity Standing balance-Leahy Scale: Good Standing balance comment: able to stand at sink unsupported                           ADL either performed or assessed with clinical judgement   ADL Overall ADL's : Needs assistance/impaired     Grooming: Wash/dry hands;Wash/dry face;Oral care;Supervision/safety Grooming Details (indicate cue type and reason): stood at sink with supervision, no cues needed         Upper Body Dressing : Supervision/safety;Standing Upper Body Dressing Details (indicate cue type and reason): donned gown to cover back Lower Body Dressing: Minimal assistance;Sitting/lateral leans Lower Body Dressing Details (indicate cue type and reason): required assistance to donn right sock Toilet Transfer: Supervision/safety;Ambulation;Regular Glass blower/designer Details (indicate cue type and reason): walked to bathroom and demonstrated toilet transfer           General ADL Comments: performed ambulation to bathroom without RW    Extremity/Trunk Assessment              Vision       Perception     Praxis  Cognition Arousal/Alertness: Awake/alert Behavior During Therapy: WFL for tasks assessed/performed Overall Cognitive Status: Impaired/Different from baseline Area  of Impairment: Safety/judgement                   Current Attention Level: Sustained Memory: Decreased short-term memory, Decreased recall of precautions   Safety/Judgement: Decreased awareness of safety     General Comments: impulsive with cues for safety        Exercises      Shoulder Instructions       General Comments      Pertinent Vitals/ Pain       Pain Assessment Pain Assessment: Faces Faces Pain Scale: No hurt Pain Intervention(s): Monitored during session  Home Living                                          Prior Functioning/Environment              Frequency  Min 2X/week        Progress Toward Goals  OT Goals(current goals can now be found in the care plan section)  Progress towards OT goals: Progressing toward goals  Acute Rehab OT Goals Patient Stated Goal: go home OT Goal Formulation: With patient Time For Goal Achievement: 03/30/22 Potential to Achieve Goals: Good ADL Goals Pt Will Perform Grooming: with supervision;standing Pt Will Perform Lower Body Bathing: with supervision;sit to/from stand Pt Will Perform Lower Body Dressing: with supervision;sit to/from stand Pt Will Transfer to Toilet: with supervision;ambulating;bedside commode Pt Will Perform Toileting - Clothing Manipulation and hygiene: with supervision;sit to/from stand Pt Will Perform Tub/Shower Transfer: with supervision;with modified independence;ambulating Additional ADL Goal #1: Pt will verbalize and demo 3 ECTs during ADLs and ADLmobility tasks  Plan Discharge plan needs to be updated    Co-evaluation                 AM-PAC OT "6 Clicks" Daily Activity     Outcome Measure   Help from another person eating meals?: None Help from another person taking care of personal grooming?: A Little Help from another person toileting, which includes using toliet, bedpan, or urinal?: A Little Help from another person bathing (including washing,  rinsing, drying)?: A Little Help from another person to put on and taking off regular upper body clothing?: None Help from another person to put on and taking off regular lower body clothing?: A Little 6 Click Score: 20    End of Session Equipment Utilized During Treatment: Gait belt;Rolling walker (2 wheels)  OT Visit Diagnosis: Unsteadiness on feet (R26.81);Muscle weakness (generalized) (M62.81);Other symptoms and signs involving cognitive function   Activity Tolerance Patient tolerated treatment well   Patient Left in chair;with call bell/phone within reach;with chair alarm set   Nurse Communication Mobility status        Time: 3536-1443 OT Time Calculation (min): 25 min  Charges: OT General Charges $OT Visit: 1 Visit OT Treatments $Self Care/Home Management : 8-22 mins $Therapeutic Activity: 8-22 mins  Lodema Hong, [place]  Office [phone]   Trixie Dredge 03/27/2022, 10:57 AM

## 2022-03-27 NOTE — Progress Notes (Signed)
Physical Therapy Treatment Patient Details Name: Nathan Tyler MRN: [id number] DOB: [birth year] Today's Date: 03/27/2022   History of Present Illness 66 y.o. male admitted to [facility] 11/17 with dyspnea, weakness, respiratory failure and acute worsening renal failure. CRRT 11/19-11/21. Transferred to [facility] 11/23.  PMhx: renal transplant with CKD, T2DM, HTN, hypothyroidism,  movement disorder, recent admission 11/1-11/8/23 for CAP    PT Comments    Limited visit as pt reports to go home this pm around 5:30 when his wife gets off work.  States she had biopsy this morning as well due to suspicious mammogram.  RN CM states set up aide and HHPT/OT for home.  Educated pt in stair negotiation for home entry with and without RW wife to use gait belt.  Patient verbalized understanding.  Limited since plans for d/c and energy conservation for getting home.    Recommendations for follow up therapy are one component of a multi-disciplinary discharge planning process, led by the attending physician.  Recommendations may be updated based on patient status, additional functional criteria and insurance authorization.  Follow Up Recommendations  Home health PT Can patient physically be transported by private vehicle: Yes   Assistance Recommended at Discharge Intermittent Supervision/Assistance  Patient can return home with the following Assistance with cooking/housework;Direct supervision/assist for medications management;Assist for transportation;Direct supervision/assist for financial management   Equipment Recommendations  None recommended by PT    Recommendations for Other Services       Precautions / Restrictions Precautions Precautions: Fall     Mobility  Bed Mobility               General bed mobility comments: on EOB    Transfers   Equipment used: None Transfers: Sit to/from Stand Sit to Stand: Supervision           General transfer comment: up to stand at bedside with S for  safety    Ambulation/Gait                   Stairs         General stair comments: simulated options for steps as pt reports no rails currently on steps for home entry; demonstrated using gait belt with RW tipped on steps with wife A versus using HHA with gait belt and no walker; pt verbalized understanding   Wheelchair Mobility    Modified Rankin (Stroke Patients Only)       Balance Overall balance assessment: Needs assistance Sitting-balance support: No upper extremity supported Sitting balance-Leahy Scale: Good       Standing balance-Leahy Scale: Good Standing balance comment: no UE support standing from EOB                            Cognition Arousal/Alertness: Awake/alert Behavior During Therapy: WFL for tasks assessed/performed   Area of Impairment: Safety/judgement                     Memory: Decreased short-term memory, Decreased recall of precautions   Safety/Judgement: Decreased awareness of safety              Exercises      General Comments General comments (skin integrity, edema, etc.): Messaged RNCM to ensure [name] set up and she confirmed set up through [name] and private pay for aide through [name].  Pt reports spouse working still.      Pertinent Vitals/Pain Pain Assessment Faces Pain Scale: No hurt  Home Living                          Prior Function            PT Goals (current goals can now be found in the care plan section) Progress towards PT goals: Progressing toward goals    Frequency    Min 3X/week      PT Plan Current plan remains appropriate    Co-evaluation              AM-PAC PT "6 Clicks" Mobility   Outcome Measure  Help needed turning from your back to your side while in a flat bed without using bedrails?: None Help needed moving from lying on your back to sitting on the side of a flat bed without using bedrails?: None Help needed moving to and from a  bed to a chair (including a wheelchair)?: None Help needed standing up from a chair using your arms (e.g., wheelchair or bedside chair)?: A Little Help needed to walk in hospital room?: A Little Help needed climbing 3-5 steps with a railing? : A Little 6 Click Score: 21    End of Session Equipment Utilized During Treatment: Gait belt Activity Tolerance: Patient tolerated treatment well Patient left: in bed         Time: 7092-9574 PT Time Calculation (min) (ACUTE ONLY): 6 min  Charges:    No charge                    Magda Kiel, PT Acute Rehabilitation Services Office:[phone] 03/27/2022    Reginia Naas 03/27/2022, 3:53 PM

## 2022-03-28 ENCOUNTER — Encounter: Payer: Self-pay | Admitting: Internal Medicine

## 2022-03-30 DIAGNOSIS — D631 Anemia in chronic kidney disease: Secondary | ICD-10-CM | POA: Diagnosis not present

## 2022-03-30 DIAGNOSIS — Z940 Kidney transplant status: Secondary | ICD-10-CM | POA: Diagnosis not present

## 2022-04-03 DIAGNOSIS — G255 Other chorea: Principal | ICD-10-CM

## 2022-04-05 DIAGNOSIS — Z940 Kidney transplant status: Secondary | ICD-10-CM | POA: Diagnosis not present

## 2022-04-05 DIAGNOSIS — E872 Acidosis, unspecified: Secondary | ICD-10-CM | POA: Diagnosis not present

## 2022-04-05 DIAGNOSIS — N179 Acute kidney failure, unspecified: Secondary | ICD-10-CM | POA: Diagnosis not present

## 2022-04-05 DIAGNOSIS — D631 Anemia in chronic kidney disease: Secondary | ICD-10-CM | POA: Diagnosis not present

## 2022-04-05 DIAGNOSIS — R197 Diarrhea, unspecified: Secondary | ICD-10-CM | POA: Diagnosis not present

## 2022-04-05 DIAGNOSIS — I120 Hypertensive chronic kidney disease with stage 5 chronic kidney disease or end stage renal disease: Secondary | ICD-10-CM | POA: Diagnosis not present

## 2022-04-05 DIAGNOSIS — G255 Other chorea: Secondary | ICD-10-CM | POA: Diagnosis not present

## 2022-04-05 DIAGNOSIS — E1122 Type 2 diabetes mellitus with diabetic chronic kidney disease: Secondary | ICD-10-CM | POA: Diagnosis not present

## 2022-04-05 DIAGNOSIS — N185 Chronic kidney disease, stage 5: Secondary | ICD-10-CM | POA: Diagnosis not present

## 2022-04-05 DIAGNOSIS — Z79899 Other long term (current) drug therapy: Secondary | ICD-10-CM | POA: Diagnosis not present

## 2022-04-12 ENCOUNTER — Encounter (HOSPITAL_COMMUNITY): Payer: Self-pay

## 2022-04-12 ENCOUNTER — Encounter (HOSPITAL_COMMUNITY): Payer: Medicare Other

## 2022-04-12 DIAGNOSIS — I5032 Chronic diastolic (congestive) heart failure: Secondary | ICD-10-CM | POA: Diagnosis not present

## 2022-04-12 DIAGNOSIS — E782 Mixed hyperlipidemia: Secondary | ICD-10-CM | POA: Diagnosis not present

## 2022-04-12 DIAGNOSIS — Z940 Kidney transplant status: Secondary | ICD-10-CM | POA: Diagnosis not present

## 2022-04-12 DIAGNOSIS — N185 Chronic kidney disease, stage 5: Secondary | ICD-10-CM | POA: Diagnosis not present

## 2022-04-12 DIAGNOSIS — E669 Obesity, unspecified: Secondary | ICD-10-CM | POA: Diagnosis not present

## 2022-04-12 DIAGNOSIS — D509 Iron deficiency anemia, unspecified: Secondary | ICD-10-CM | POA: Diagnosis not present

## 2022-04-12 DIAGNOSIS — S62301D Unspecified fracture of second metacarpal bone, left hand, subsequent encounter for fracture with routine healing: Secondary | ICD-10-CM | POA: Diagnosis not present

## 2022-04-12 DIAGNOSIS — I132 Hypertensive heart and chronic kidney disease with heart failure and with stage 5 chronic kidney disease, or end stage renal disease: Secondary | ICD-10-CM | POA: Diagnosis not present

## 2022-04-12 DIAGNOSIS — I509 Heart failure, unspecified: Secondary | ICD-10-CM | POA: Diagnosis not present

## 2022-04-12 DIAGNOSIS — R197 Diarrhea, unspecified: Secondary | ICD-10-CM | POA: Diagnosis not present

## 2022-04-12 DIAGNOSIS — J9601 Acute respiratory failure with hypoxia: Secondary | ICD-10-CM | POA: Diagnosis not present

## 2022-04-12 DIAGNOSIS — E1169 Type 2 diabetes mellitus with other specified complication: Secondary | ICD-10-CM | POA: Diagnosis not present

## 2022-04-12 DIAGNOSIS — G2401 Drug induced subacute dyskinesia: Principal | ICD-10-CM

## 2022-04-12 DIAGNOSIS — G255 Other chorea: Principal | ICD-10-CM

## 2022-04-12 MED ORDER — AUSTEDO 6 MG TABLET
ORAL_TABLET | Freq: Two times a day (BID) | ORAL | 6 refills | 30 days | Status: CP
Start: 2022-04-12 — End: ?
  Filled 2022-04-19: qty 60, 30d supply, fill #0

## 2022-04-12 NOTE — Unmapped (Signed)
Refill request received from patient.      Medication Requested: Deutetrabenazine 6 mg   Last Office Visit: 10/20/2021   Next Office Visit: Visit date not found  Last Prescriber: Dr. Raenette Rover     Nurse refill requirements met? Yes  If not met, why:     Sent to: Pharmacy per protocol  If sent to provider, which provider?:

## 2022-04-13 NOTE — Unmapped (Signed)
[facility] Refill Coordination Note    Specialty Medication(s) to be Shipped:   Neurology: Gary Rogers    Other medication(s) to be shipped: No additional medications requested for fill at this time     Gary Rogers, DOB: 05/01/1955  Phone: [phone] (home)       All above HIPAA information was verified with patient.     Was a Nurse, learning disability used for this call? No    Completed refill call assessment today to schedule patient's medication shipment from the [facility] [phone]).  All relevant notes have been reviewed.     Specialty medication(s) and dose(s) confirmed: Regimen is correct and unchanged.   Changes to medications: Gary Rogers reports no changes at this time.  Changes to insurance: No  New side effects reported not previously addressed with a pharmacist or physician: None reported  Questions for the pharmacist: No    Confirmed patient received a Conservation officer, historic buildings and a Surveyor, mining with first shipment. The patient will receive a drug information handout for each medication shipped and additional FDA Medication Guides as required.       DISEASE/MEDICATION-SPECIFIC INFORMATION        N/A    SPECIALTY MEDICATION ADHERENCE     Medication Adherence    Patient reported X missed doses in the last month: 0  Specialty Medication: AUSTEDO 6 mg  Patient is on additional specialty medications: No  Patient is on more than two specialty medications: No  Any gaps in refill history greater than 2 weeks in the last 3 months: no  Demonstrates understanding of importance of adherence: yes                          Were doses missed due to medication being on hold? No    Austedo 6 mg: 8  days of medicine on hand        REFERRAL TO PHARMACIST     Referral to the pharmacist: Not needed      [facility]     Shipping address confirmed in Epic.     Delivery Scheduled: Yes, Expected medication delivery date:  04/19/22 .     Medication will be delivered via UPS to the prescription address in Epic WAM.    Gary Rogers   [facility] Specialty Technician

## 2022-04-18 NOTE — Unmapped (Signed)
Gary Rogers 's Austedo shipment will be delayed as a result of insufficient inventory of the drug.     I have reached out to the patient and communicated the delay. We will reschedule the medication for the delivery date that the patient agreed upon.  We have confirmed the delivery date as 04/20/2022, via ups.

## 2022-04-19 ENCOUNTER — Ambulatory Visit (HOSPITAL_COMMUNITY)
Admission: RE | Admit: 2022-04-19 | Discharge: 2022-04-19 | Disposition: A | Discharge status: Home / Self Care | Payer: Medicare Other | Source: Ambulatory Visit | Attending: Nephrology | Admitting: Nephrology

## 2022-04-19 VITALS — BP 150/69 | HR 72 | Temp 97.7°F | Resp 18

## 2022-04-19 DIAGNOSIS — D631 Anemia in chronic kidney disease: Secondary | ICD-10-CM | POA: Insufficient documentation

## 2022-04-19 DIAGNOSIS — N183 Chronic kidney disease, stage 3 unspecified: Secondary | ICD-10-CM | POA: Diagnosis not present

## 2022-04-19 LAB — POCT HEMOGLOBIN-HEMACUE: Hemoglobin: 10.5 g/dL — ABNORMAL LOW (ref 13.0–17.0)

## 2022-04-19 MED ORDER — EPOETIN ALFA 40000 UNIT/ML IJ SOLN
40000.0000 [IU] | INTRAMUSCULAR | Status: DC
  Administered 2022-04-19: 40000 [IU] via SUBCUTANEOUS

## 2022-04-19 MED ORDER — EPOETIN ALFA 40000 UNIT/ML IJ SOLN
INTRAMUSCULAR | Status: AC
  Filled 2022-04-19: qty 1

## 2022-04-26 DIAGNOSIS — B351 Tinea unguium: Secondary | ICD-10-CM | POA: Diagnosis not present

## 2022-04-26 DIAGNOSIS — E1142 Type 2 diabetes mellitus with diabetic polyneuropathy: Secondary | ICD-10-CM | POA: Diagnosis not present

## 2022-04-27 DIAGNOSIS — R55 Syncope and collapse: Secondary | ICD-10-CM | POA: Diagnosis not present

## 2022-04-27 DIAGNOSIS — E161 Other hypoglycemia: Secondary | ICD-10-CM | POA: Diagnosis not present

## 2022-04-27 DIAGNOSIS — E162 Hypoglycemia, unspecified: Secondary | ICD-10-CM | POA: Diagnosis not present

## 2022-04-27 DIAGNOSIS — I499 Cardiac arrhythmia, unspecified: Secondary | ICD-10-CM | POA: Diagnosis not present

## 2022-04-27 DIAGNOSIS — R404 Transient alteration of awareness: Secondary | ICD-10-CM | POA: Diagnosis not present

## 2022-04-30 DIAGNOSIS — E113293 Type 2 diabetes mellitus with mild nonproliferative diabetic retinopathy without macular edema, bilateral: Secondary | ICD-10-CM | POA: Diagnosis not present

## 2022-05-10 NOTE — Unmapped (Signed)
[facility] Refill Coordination Note    Specialty Medication(s) to be Shipped:   Neurology: Gary Rogers    Other medication(s) to be shipped: No additional medications requested for fill at this time     Gary Rogers, DOB: 1955-05-29  Phone: [phone] (home)       All above HIPAA information was verified with patient.     Was a Nurse, learning disability used for this call? No    Completed refill call assessment today to schedule patient's medication shipment from the [facility] [phone]).  All relevant notes have been reviewed.     Specialty medication(s) and dose(s) confirmed: Regimen is correct and unchanged.   Changes to medications: Gary Rogers reports no changes at this time.  Changes to insurance: No  New side effects reported not previously addressed with a pharmacist or physician: None reported  Questions for the pharmacist: No    Confirmed patient received a Conservation officer, historic buildings and a Surveyor, mining with first shipment. The patient will receive a drug information handout for each medication shipped and additional FDA Medication Guides as required.       DISEASE/MEDICATION-SPECIFIC INFORMATION        N/A    SPECIALTY MEDICATION ADHERENCE     Medication Adherence    Patient reported X missed doses in the last month: 0  Specialty Medication: AUSTEDO 6 mg  Patient is on additional specialty medications: No                                Were doses missed due to medication being on hold? No    Austedo 6 mg: 14 days of medicine on hand       REFERRAL TO PHARMACIST     Referral to the pharmacist: Not needed      [facility]     Shipping address confirmed in Epic.     Delivery Scheduled: Yes, Expected medication delivery date: 05/18/22.     Medication will be delivered via UPS to the prescription address in Epic WAM.    Quintella Reichert   [facility] Specialty Technician

## 2022-05-11 DIAGNOSIS — N179 Acute kidney failure, unspecified: Secondary | ICD-10-CM | POA: Diagnosis not present

## 2022-05-11 DIAGNOSIS — E875 Hyperkalemia: Secondary | ICD-10-CM | POA: Diagnosis not present

## 2022-05-11 DIAGNOSIS — Z940 Kidney transplant status: Secondary | ICD-10-CM | POA: Diagnosis not present

## 2022-05-11 DIAGNOSIS — I10 Essential (primary) hypertension: Secondary | ICD-10-CM | POA: Diagnosis not present

## 2022-05-11 DIAGNOSIS — E872 Acidosis, unspecified: Secondary | ICD-10-CM | POA: Diagnosis not present

## 2022-05-11 DIAGNOSIS — D631 Anemia in chronic kidney disease: Secondary | ICD-10-CM | POA: Diagnosis not present

## 2022-05-11 DIAGNOSIS — E1122 Type 2 diabetes mellitus with diabetic chronic kidney disease: Secondary | ICD-10-CM | POA: Diagnosis not present

## 2022-05-11 DIAGNOSIS — Z79899 Other long term (current) drug therapy: Secondary | ICD-10-CM | POA: Diagnosis not present

## 2022-05-11 DIAGNOSIS — N189 Chronic kidney disease, unspecified: Secondary | ICD-10-CM | POA: Diagnosis not present

## 2022-05-17 ENCOUNTER — Ambulatory Visit (HOSPITAL_COMMUNITY)
Admission: RE | Admit: 2022-05-17 | Discharge: 2022-05-17 | Disposition: A | Discharge status: Home / Self Care | Payer: Medicare Other | Source: Ambulatory Visit | Attending: Nephrology | Admitting: Nephrology

## 2022-05-17 VITALS — BP 146/74 | HR 59 | Temp 97.6°F | Resp 17

## 2022-05-17 DIAGNOSIS — N183 Chronic kidney disease, stage 3 unspecified: Secondary | ICD-10-CM | POA: Diagnosis not present

## 2022-05-17 LAB — CBC WITH DIFFERENTIAL/PLATELET
Abs Immature Granulocytes: 0.03 10*3/uL (ref 0.00–0.07)
Basophils Absolute: 0 10*3/uL (ref 0.0–0.1)
Basophils Relative: 0 %
Eosinophils Absolute: 0.1 10*3/uL (ref 0.0–0.5)
Eosinophils Relative: 2 %
HCT: 34.6 % — ABNORMAL LOW (ref 39.0–52.0)
Hemoglobin: 11.2 g/dL — ABNORMAL LOW (ref 13.0–17.0)
Immature Granulocytes: 0 %
Lymphocytes Relative: 37 %
Lymphs Abs: 2.7 10*3/uL (ref 0.7–4.0)
MCH: 29.6 pg (ref 26.0–34.0)
MCHC: 32.4 g/dL (ref 30.0–36.0)
MCV: 91.5 fL (ref 80.0–100.0)
Monocytes Absolute: 0.7 10*3/uL (ref 0.1–1.0)
Monocytes Relative: 10 %
Neutro Abs: 3.8 10*3/uL (ref 1.7–7.7)
Neutrophils Relative %: 51 %
Platelets: 275 10*3/uL (ref 150–400)
RBC: 3.78 MIL/uL — ABNORMAL LOW (ref 4.22–5.81)
RDW: 13.6 % (ref 11.5–15.5)
WBC: 7.4 10*3/uL (ref 4.0–10.5)
nRBC: 0 % (ref 0.0–0.2)

## 2022-05-17 LAB — RENAL FUNCTION PANEL
Albumin: 3.9 g/dL (ref 3.5–5.0)
Anion gap: 13 (ref 5–15)
BUN: 139 mg/dL — ABNORMAL HIGH (ref 8–23)
CO2: 23 mmol/L (ref 22–32)
Calcium: 8.9 mg/dL (ref 8.9–10.3)
Chloride: 106 mmol/L (ref 98–111)
Creatinine, Ser: 4.08 mg/dL — ABNORMAL HIGH (ref 0.61–1.24)
GFR, Estimated: 15 mL/min — ABNORMAL LOW (ref >60)
Glucose, Bld: 34 mg/dL — CL (ref 70–99)
Phosphorus: 6.3 mg/dL — ABNORMAL HIGH (ref 2.5–4.6)
Potassium: 3.8 mmol/L (ref 3.5–5.1)
Sodium: 142 mmol/L (ref 135–145)

## 2022-05-17 MED ORDER — EPOETIN ALFA 40000 UNIT/ML IJ SOLN
INTRAMUSCULAR | Status: AC
  Filled 2022-05-17: qty 1

## 2022-05-17 MED ORDER — EPOETIN ALFA 40000 UNIT/ML IJ SOLN
40000.0000 [IU] | INTRAMUSCULAR | Status: DC
  Administered 2022-05-17: 40000 [IU] via SUBCUTANEOUS

## 2022-05-17 MED FILL — AUSTEDO 6 MG TABLET: ORAL | 30 days supply | Qty: 60 | Fill #1

## 2022-05-17 NOTE — Progress Notes (Signed)
Estill Bamberg with Main Lab called with glucose of 34. Attempted to call patient and left message to call back.  Also was able to speak to patient's wife and let her know that Mr. Ringenberg glucose was 34 on his labs.

## 2022-05-18 LAB — POCT HEMOGLOBIN-HEMACUE: Hemoglobin: 11.3 g/dL — ABNORMAL LOW (ref 13.0–17.0)

## 2022-05-21 ENCOUNTER — Ambulatory Visit (INDEPENDENT_AMBULATORY_CARE_PROVIDER_SITE_OTHER)
Admission: RE | Admit: 2022-05-21 | Discharge: 2022-05-21 | Disposition: A | Discharge status: Home / Self Care | Payer: Medicare Other | Source: Ambulatory Visit | Attending: Surgery | Admitting: Surgery

## 2022-05-21 ENCOUNTER — Other Ambulatory Visit: Payer: Self-pay | Admitting: *Deleted

## 2022-05-21 ENCOUNTER — Ambulatory Visit (HOSPITAL_COMMUNITY)
Admission: RE | Admit: 2022-05-21 | Discharge: 2022-05-21 | Disposition: A | Discharge status: Home / Self Care | Payer: Medicare Other | Source: Ambulatory Visit | Attending: Surgery | Admitting: Surgery

## 2022-05-21 DIAGNOSIS — N189 Chronic kidney disease, unspecified: Secondary | ICD-10-CM

## 2022-05-22 ENCOUNTER — Ambulatory Visit (INDEPENDENT_AMBULATORY_CARE_PROVIDER_SITE_OTHER): Payer: Medicare Other | Admitting: Gastroenterology

## 2022-05-22 ENCOUNTER — Ambulatory Visit (INDEPENDENT_AMBULATORY_CARE_PROVIDER_SITE_OTHER): Payer: Medicare Other | Admitting: Vascular Surgery

## 2022-05-22 ENCOUNTER — Encounter: Payer: Self-pay | Admitting: Gastroenterology

## 2022-05-22 ENCOUNTER — Encounter: Payer: Self-pay | Admitting: Vascular Surgery

## 2022-05-22 VITALS — BP 131/70 | HR 66 | Temp 97.1°F | Ht 71.0 in | Wt 206.1 lb

## 2022-05-22 VITALS — BP 115/68 | HR 58 | Temp 98.3°F | Resp 20 | Ht 71.0 in | Wt 204.0 lb

## 2022-05-22 DIAGNOSIS — R197 Diarrhea, unspecified: Secondary | ICD-10-CM

## 2022-05-22 DIAGNOSIS — N185 Chronic kidney disease, stage 5: Secondary | ICD-10-CM

## 2022-05-22 MED ORDER — RIFAXIMIN 550 MG PO TABS: 550.0000 mg | ORAL_TABLET | Freq: Three times a day (TID) | ORAL | 0 refills | Status: AC

## 2022-05-22 NOTE — Patient Instructions (Signed)
Please have blood work done.  I have sent in Xifaxan to take three times a day for 14 days. We might have to do a prior authorization. If still too expensive, we will try to get samples!  We will see you in 3 months!  I enjoyed seeing you again today! At our first visit, I mentioned how I value our relationship and want to provide genuine, compassionate, and quality care. You may receive a survey regarding your visit with me, and I welcome your feedback! Thanks so much for taking the time to complete this. I look forward to seeing you again.   Annitta Needs, PhD, ANP-BC [facility]

## 2022-05-22 NOTE — Progress Notes (Signed)
Gastroenterology Office Note     Primary Care Physician:  Celene Squibb, MD  Primary Gastroenterologist: Dr. Gala Romney    Chief Complaint   Chief Complaint  Patient presents with   Diarrhea    Patient here today due to issues with diarrhea, on going since March 2023. Patient states it is some what better. Patient states he was prescribed Creon and this was of no help to him so he stopped the medication. Patient says he also has Left sided chest pain that is constant and never increases or decreases in level of pain.Patient denies any reflux issues.     History of Present Illness   Nathan Tyler is a 67 y.o. male presenting today in follow-up with a history of chronic diarrhea, chronic Hep C s/p treatment and sustained SVR, F2 historically without need for ongoing surveillance.   Colonoscopy 2023 with pancolonic diverticulosis, three polyps, adenomas, negative microscopic colitis. No improvement with Creon. Feels like diarrhea is better than in the past. Able to go places now without worrying about diarrhea.   Left-sided chest pain but feels like a bruise. Constant. Present over a year but not worse. Only worsened with rubbing. No association with eating. Eating stricter diet, which has helped with stool consistency. However, still having 3 soft/loose stools per day.   Not on dialysis. History of renal transplant. Port placement in next several weeks.   Review of labs over past few months during hospitalizations show Hgb as low at 6.7.  Felt to have anemia of chronic disease with iron deficiency component. He had 1 unit PRBCs while inpatient in Nov 2023. Most recent Hgb 11.2. no overt GI bleeding.    Past Medical History:  Diagnosis Date   Chorea    Diabetes mellitus without complication ([place])    Essential hypertension    Hypercholesteremia    Hypothyroidism    Neuromuscular disorder (HCC)    PONV (postoperative nausea and vomiting)    Renal failure    Has had kidney transplant  2008    Past Surgical History:  Procedure Laterality Date   AMPUTATION Right 06/27/2016   Procedure: AMPUTATION 5TH TOE RIGHT FOOT;  Surgeon: Caprice Beaver, DPM;  Location: [facility];  Service: Podiatry;  Laterality: Right;   AMPUTATION TOE Left 09/02/2015   Procedure: AMPUTATION  2ND TOE LEFT FOOT;  Surgeon: Aviva Signs, MD;  Location: [facility];  Service: General;  Laterality: Left;   BIOPSY  11/24/2021   Procedure: BIOPSY;  Surgeon: Daneil Dolin, MD;  Location: [facility];  Service: Endoscopy;;   CATARACT EXTRACTION Bilateral    COLONOSCOPY  01/2014   done in [place]. patient reports h/o colon polyps on TCS in 2010, 2015 colonoscopy normal but advised to come back in 2020.    COLONOSCOPY WITH PROPOFOL N/A 01/19/2019   Procedure: COLONOSCOPY WITH PROPOFOL;  Surgeon: Daneil Dolin, MD;  [facility]: [facility];  Service: Endoscopy;  Laterality: N/A;  9:45am   COLONOSCOPY WITH PROPOFOL N/A 11/24/2021   Procedure: COLONOSCOPY WITH PROPOFOL;  Surgeon: Daneil Dolin, MD;  [facility];  Service: Endoscopy;  Laterality: N/A;  2:15pm   IR FLUORO GUIDE CV LINE RIGHT  03/14/2022   IR PERC TUN PERIT CATH WO PORT S&I /IMAG  03/14/2022   IR REMOVAL TUN CV CATH W/O FL  03/19/2022   IR US GUIDE VASC ACCESS RIGHT  03/14/2022   KIDNEY TRANSPLANT Right 2010   POLYPECTOMY  01/19/2019   Procedure: POLYPECTOMY;  Surgeon: Daneil Dolin, MD;  [facility];  Service: Endoscopy;;   POLYPECTOMY  11/24/2021   Procedure: POLYPECTOMY;  Surgeon: Daneil Dolin, MD;  [facility];  Service: Endoscopy;;    Current Outpatient Medications  Medication Sig Dispense Refill   amLODipine (NORVASC) 10 MG tablet Take 10 mg by mouth daily.     atorvastatin (LIPITOR) 10 MG tablet Take 10 mg by mouth daily.     carvedilol (COREG) 12.5 MG tablet Take 12.5 mg by mouth 2 (two) times daily.     Deutetrabenazine (AUSTEDO) 6 MG TABS Take 6 mg by mouth 2 (two) times daily.     hydrALAZINE  (APRESOLINE) 50 MG tablet Take 50 mg by mouth 3 (three) times daily.     Insulin Disposable Pump (OMNIPOD DASH PODS, GEN 4,) MISC Inject 5 Units into the skin 3 (three) times daily. Medication: Novolog family member states usually runs about 5 units TID depending on his sliding scale     levothyroxine (SYNTHROID) 125 MCG tablet Take 125 mcg by mouth daily.     mycophenolate (MYFORTIC) 180 MG EC tablet Take 1 tablet (180 mg total) by mouth 2 (two) times daily.     OVER THE COUNTER MEDICATION Dexacon G 7 glucose monitor     predniSONE (DELTASONE) 5 MG tablet Take 1 tablet (5 mg total) by mouth daily with breakfast. restart '5mg'$  dose 3/10 after completing prednisone taper (2/24 - 3/9)     sodium bicarbonate 650 MG tablet Take 650 mg by mouth 2 (two) times daily.     tacrolimus (PROGRAF) 0.5 MG capsule Take 1.5 mg by mouth 2 (two) times daily.      furosemide (LASIX) 80 MG tablet Take 1 tablet (80 mg total) by mouth 2 (two) times daily. (Patient taking differently: Take 80 mg by mouth 2 (two) times daily. 80 in the am and 40 mg Qhs per patient.) 60 tablet 0   lipase/protease/amylase (CREON) 36000 UNITS CPEP capsule Take 2 capsules (72,000 Units total) by mouth 3 (three) times daily with meals. Take 1 with snacks (Patient not taking: Reported on 05/22/2022) 240 capsule 3   QUEtiapine (SEROQUEL) 25 MG tablet Take 1 tablet (25 mg total) by mouth at bedtime for 5 days. (Patient not taking: Reported on 05/22/2022) 5 tablet 0   No current facility-administered medications for this visit.    Allergies as of 05/22/2022   (No Known Allergies)    Family History  Problem Relation Age of Onset   Colon cancer Maternal Aunt 75       passed from [facility]    Social History   Socioeconomic History   Marital status: Widowed    Spouse name: Not on file   Number of children: Not on file   Years of education: Not on file   Highest education level: Not on file  Occupational History   Not on file  Tobacco Use    Smoking status: Never    Passive exposure: Never   Smokeless tobacco: Never  Vaping Use   Vaping Use: Never used  Substance and Sexual Activity   Alcohol use: Yes    Alcohol/week: 0.0 standard drinks of alcohol    Comment: As of 09/02/2018: Glass of wine 1-2 times a month   Drug use: No   Sexual activity: Yes    Birth control/protection: None  Other Topics Concern   Not on file  Social History Narrative   Not on file   Social Determinants of  Health   Financial Resource Strain: Not on file  Food Insecurity: No Food Insecurity (03/09/2022)   Hunger Vital Sign    Worried About Running Out of Food in the Last Year: Never true    Ran Out of Food in the Last Year: Never true  Transportation Needs: No Transportation Needs (03/09/2022)   PRAPARE - Hydrologist (Medical): No    Lack of Transportation (Non-Medical): No  Physical Activity: Not on file  Stress: Not on file  Social Connections: Not on file  Intimate Partner Violence: Not At Risk (03/09/2022)   Humiliation, Afraid, Rape, and Kick questionnaire    Fear of Current or Ex-Partner: No    Emotionally Abused: No    Physically Abused: No    Sexually Abused: No     Review of Systems   Gen: Denies any fever, chills, fatigue, weight loss, lack of appetite.  CV: Denies chest pain, heart palpitations, peripheral edema, syncope.  Resp: Denies shortness of breath at rest or with exertion. Denies wheezing or cough.  GI: Denies dysphagia or odynophagia. Denies jaundice, hematemesis, fecal incontinence. GU : Denies urinary burning, urinary frequency, urinary hesitancy MS: Denies joint pain, muscle weakness, cramps, or limitation of movement.  Derm: Denies rash, itching, dry skin Psych: Denies depression, anxiety, memory loss, and confusion Heme: Denies bruising, bleeding, and enlarged lymph nodes.   Physical Exam   BP 131/70 (BP Location: Left Arm, Patient Position: Sitting, Cuff Size: Large)    Pulse 66   Temp (!) 97.1 F (36.2 C) (Temporal)   Ht '5\' 11"'$  (1.803 m)   Wt 206 lb 1.6 oz (93.5 kg)   BMI 28.75 kg/m  General:   Alert and oriented. Pleasant and cooperative. Well-nourished and well-developed.  Head:  Normocephalic and atraumatic. Eyes:  Without icterus Abdomen:  +BS, soft, non-tender and non-distended. No HSM noted. No guarding or rebound. No masses appreciated.  Rectal:  Deferred  Msk:  Symmetrical without gross deformities. Normal posture. Extremities:  Without edema. Neurologic:  Alert and  oriented x4;  grossly normal neurologically. Skin:  Intact without significant lesions or rashes. Psych:  Alert and cooperative. Normal mood and affect.  Lab Results  Component Value Date   IRON 33 (L) 02/22/2022   TIBC 260 02/22/2022   FERRITIN 155 02/22/2022    Lab Results  Component Value Date   WBC 7.4 05/17/2022   HGB 11.2 (L) 05/17/2022   HCT 34.6 (L) 05/17/2022   MCV 91.5 05/17/2022   PLT 275 05/17/2022     Assessment   Nathan Tyler is a 67 y.o. male presenting today in follow-up with a history of chronic diarrhea, chronic Hep C s/p treatment and sustained SVR, F2 historically without need for ongoing surveillance. Returning today in routine follow-up.  Chronic diarrhea: negative colonoscopy for microscopic colitis last year. Some improvement with dietary changes but still several per day. Would like to trial Xifaxan. Check celiac serologies for completeness' sake.   Acute on chronic anemia: noted during hospitalizations, 1 unit PRBCs received. Felt related to worsening CKD + IDA component. He is wanting to hold off on EGD right now due to wife's illness. No overt GI bleeding.  Left-sided chest soreness: present over year. Appears musculoskeletal. No alarm signs/symptoms,. Follow-up with PCP.      PLAN    Course of Xifaxan TID for 14 days Celiac serologies  EGD when able to pursue Return in 3 months   Annitta Needs, PhD, ANP-BC [facility]  Gastroenterology

## 2022-05-22 NOTE — Progress Notes (Signed)
[facility] [place]  ASSESSMENT / PLAN: Nathan Tyler is a 67 y.o. left handed male in need of permanent dialysis access. I reviewed options for dialysis in detail with the patient, including hemodialysis and peritoneal dialysis. I counseled the patient to ask their nephrologist about their candidacy for renal transplant. I counseled the patient that dialysis access requires surveillance and periodic maintenance. Plan to proceed with right brachiocephalic arteriovenous fistula in near future.   CHIEF COMPLAINT: worsening renal function  HISTORY OF PRESENT ILLNESS: Nathan Tyler is a 67 y.o. male who presents to clinic for discussion of permanent dialysis access. He is left handed. He has never had permanent dialysis access before. He previously had a renal transplant that worked well for him for 13 years. Unfortunately, the graft is starting to fail.   Past Medical History:  Diagnosis Date   Chorea    Diabetes mellitus without complication ([place])    Essential hypertension    Hypercholesteremia    Hypothyroidism    Neuromuscular disorder (HCC)    PONV (postoperative nausea and vomiting)    Renal failure    Has had kidney transplant 2008    Past Surgical History:  Procedure Laterality Date   AMPUTATION Right 06/27/2016   Procedure: AMPUTATION 5TH TOE RIGHT FOOT;  Surgeon: Caprice Beaver, DPM;  Location: [facility];  Service: Podiatry;  Laterality: Right;   AMPUTATION TOE Left 09/02/2015   Procedure: AMPUTATION  2ND TOE LEFT FOOT;  Surgeon: Aviva Signs, MD;  Location: [facility];  Service: General;  Laterality: Left;   BIOPSY  11/24/2021   Procedure: BIOPSY;  Surgeon: Daneil Dolin, MD;  [facility];  Service: Endoscopy;;   CATARACT EXTRACTION Bilateral    COLONOSCOPY  01/2014   done in [place]. patient reports h/o colon polyps on TCS in 2010, 2015 colonoscopy normal but advised to come back in 2020.    COLONOSCOPY WITH PROPOFOL N/A 01/19/2019    Procedure: COLONOSCOPY WITH PROPOFOL;  Surgeon: Daneil Dolin, MD;  [facility];  Service: Endoscopy;  Laterality: N/A;  9:45am   COLONOSCOPY WITH PROPOFOL N/A 11/24/2021   pancolonic diverticulosis, three polyps, adenomas, negative microscopic colitis   IR FLUORO GUIDE CV LINE RIGHT  03/14/2022   IR PERC TUN PERIT CATH WO PORT S&I /IMAG  03/14/2022   IR REMOVAL TUN CV CATH W/O FL  03/19/2022   IR US GUIDE VASC ACCESS RIGHT  03/14/2022   KIDNEY TRANSPLANT Right 2010   POLYPECTOMY  01/19/2019   Procedure: POLYPECTOMY;  Surgeon: Daneil Dolin, MD;  [facility];  Service: Endoscopy;;   POLYPECTOMY  11/24/2021   Procedure: POLYPECTOMY;  Surgeon: Daneil Dolin, MD;  [facility];  Service: Endoscopy;;    Family History  Problem Relation Age of Onset   Colon cancer Maternal Aunt 75       passed from [place] History   Marital status: Widowed    Spouse name: Not on file   Number of children: Not on file   Years of education: Not on file   Highest education level: Not on file  Occupational History   Not on file  Tobacco Use   Smoking status: Never    Passive exposure: Never   Smokeless tobacco: Never  Vaping Use   Vaping Use: Never used  Substance and Sexual Activity   Alcohol use: Yes    Alcohol/week: 0.0 standard drinks of  alcohol    Comment: As of 09/02/2018: Glass of wine 1-2 times a month   Drug use: No   Sexual activity: Yes    Birth control/protection: None  Other Topics Concern   Not on file  Social History Narrative   Not on file   Social Determinants of Health   Financial Resource Strain: Not on file  Food Insecurity: No Food Insecurity (03/09/2022)   Hunger Vital Sign    Worried About Running Out of Food in the Last Year: Never true    Ran Out of Food in the Last Year: Never true  Transportation Needs: No Transportation Needs (03/09/2022)   PRAPARE - Radiographer, therapeutic (Medical): No    Lack of Transportation (Non-Medical): No  Physical Activity: Not on file  Stress: Not on file  Social Connections: Not on file  Intimate Partner Violence: Not At Risk (03/09/2022)   Humiliation, Afraid, Rape, and Kick questionnaire    Fear of Current or Ex-Partner: No    Emotionally Abused: No    Physically Abused: No    Sexually Abused: No    No Known Allergies  Current Outpatient Medications  Medication Sig Dispense Refill   amLODipine (NORVASC) 10 MG tablet Take 10 mg by mouth daily.     atorvastatin (LIPITOR) 10 MG tablet Take 10 mg by mouth daily.     carvedilol (COREG) 12.5 MG tablet Take 12.5 mg by mouth 2 (two) times daily.     Deutetrabenazine (AUSTEDO) 6 MG TABS Take 6 mg by mouth 2 (two) times daily.     hydrALAZINE (APRESOLINE) 50 MG tablet Take 50 mg by mouth 3 (three) times daily.     Insulin Disposable Pump (OMNIPOD DASH PODS, GEN 4,) MISC Inject 5 Units into the skin 3 (three) times daily. Medication: Novolog family member states usually runs about 5 units TID depending on his sliding scale     levothyroxine (SYNTHROID) 125 MCG tablet Take 125 mcg by mouth daily.     mycophenolate (MYFORTIC) 180 MG EC tablet Take 1 tablet (180 mg total) by mouth 2 (two) times daily.     predniSONE (DELTASONE) 5 MG tablet Take 1 tablet (5 mg total) by mouth daily with breakfast. restart '5mg'$  dose 3/10 after completing prednisone taper (2/24 - 3/9)     sodium bicarbonate 650 MG tablet Take 650 mg by mouth 2 (two) times daily.     tacrolimus (PROGRAF) 0.5 MG capsule Take 1.5 mg by mouth 2 (two) times daily.      furosemide (LASIX) 80 MG tablet Take 1 tablet (80 mg total) by mouth 2 (two) times daily. (Patient taking differently: Take 80 mg by mouth 2 (two) times daily. 80 in the am and 40 mg Qhs per patient.) 60 tablet 0   OVER THE COUNTER MEDICATION Dexacon G 7 glucose monitor     QUEtiapine (SEROQUEL) 25 MG tablet Take 1 tablet (25 mg total) by mouth at  bedtime for 5 days. (Patient not taking: Reported on 05/22/2022) 5 tablet 0   rifaximin (XIFAXAN) 550 MG TABS tablet Take 1 tablet (550 mg total) by mouth 3 (three) times daily for 14 days. 42 tablet 0   No current facility-administered medications for this visit.    PHYSICAL EXAM Vitals:   05/22/22 1110  BP: 115/68  Pulse: (!) 58  Resp: 20  Temp: 98.3 F (36.8 C)  SpO2: 100%  Weight: 204 lb (92.5 kg)  Height: '5\' 11"'$  (1.803 m)  Chronically ill elderly man Regular rate and rhythm Unlabored breathing 2+ brachial pulses Visible r cephalic vein  PERTINENT LABORATORY AND RADIOLOGIC DATA  Most recent CBC    Latest Ref Rng & Units 05/17/2022   10:30 AM 05/17/2022    9:53 AM 04/19/2022    9:42 AM  CBC  WBC 4.0 - 10.5 K/uL 7.4     Hemoglobin 13.0 - 17.0 g/dL 11.2  11.3  10.5   Hematocrit 39.0 - 52.0 % 34.6     Platelets 150 - 400 K/uL 275        Most recent CMP    Latest Ref Rng & Units 05/17/2022   10:30 AM 03/27/2022    5:49 AM 03/26/2022    6:06 AM  CMP  Glucose 70 - 99 mg/dL 34  143  133   BUN 8 - 23 mg/dL 139  92  94   Creatinine 0.61 - 1.24 mg/dL 4.08  5.08  4.95   Sodium 135 - 145 mmol/L 142  138  138   Potassium 3.5 - 5.1 mmol/L 3.8  4.2  4.3   Chloride 98 - 111 mmol/L 106  101  101   CO2 22 - 32 mmol/L '23  26  25   '$ Calcium 8.9 - 10.3 mg/dL 8.9  8.3  8.1     Renal function Estimated Creatinine Clearance: 20.1 mL/min (A) (by C-G formula based on SCr of 4.08 mg/dL (H)).  Hgb A1c MFr Bld (%)  Date Value  02/22/2022 5.7 (H)    No results found for: "LDLCALC", "LDLC", "HIRISKLDL", "POCLDL", "LDLDIRECT", "REALLDLC", "TOTLDLC"    Kayti Poss N. Stanford Breed, MD FACS [facility] Phone Number: [phone] 05/22/2022 8:09 PM   Total time spent on preparing this encounter including chart review, data review, collecting history, examining the patient, coordinating care for this new patient, 60 minutes.  Portions of this report may  have been transcribed using voice recognition software.  Every effort has been made to ensure accuracy; however, inadvertent computerized transcription errors may still be present.

## 2022-05-24 ENCOUNTER — Other Ambulatory Visit: Payer: Self-pay

## 2022-05-24 DIAGNOSIS — N185 Chronic kidney disease, stage 5: Secondary | ICD-10-CM

## 2022-05-24 DIAGNOSIS — R197 Diarrhea, unspecified: Secondary | ICD-10-CM | POA: Diagnosis not present

## 2022-05-25 LAB — TISSUE TRANSGLUTAMINASE, IGA: Transglutaminase IgA: <2 U/mL (ref 0–3)

## 2022-05-25 LAB — IGA: IgA/Immunoglobulin A, Serum: 90 mg/dL (ref 61–437)

## 2022-05-28 ENCOUNTER — Telehealth: Payer: Self-pay

## 2022-05-28 DIAGNOSIS — N179 Acute kidney failure, unspecified: Secondary | ICD-10-CM | POA: Diagnosis not present

## 2022-05-28 NOTE — Telephone Encounter (Signed)
Received a call from patient requesting to reschedule surgery from February 22 to the first week in March. Surgery scheduled for March 4. Instructions reviewed and patient verbalized understanding.

## 2022-05-28 NOTE — Telephone Encounter (Signed)
Pt called advising that [facility] doesn't take his insurance and [facility] cannot get the medication (Xifaxan). Pt wants to know what do you want to do next. Please advise

## 2022-05-31 NOTE — Telephone Encounter (Signed)
Is there another pharmacy he would like to use? I can send there.

## 2022-05-31 NOTE — Telephone Encounter (Signed)
Pt states you can send to [facility]

## 2022-06-01 DIAGNOSIS — T8612 Kidney transplant failure: Secondary | ICD-10-CM | POA: Diagnosis not present

## 2022-06-01 DIAGNOSIS — Z7682 Awaiting organ transplant status: Secondary | ICD-10-CM | POA: Diagnosis not present

## 2022-06-01 DIAGNOSIS — Z125 Encounter for screening for malignant neoplasm of prostate: Secondary | ICD-10-CM | POA: Diagnosis not present

## 2022-06-01 DIAGNOSIS — E039 Hypothyroidism, unspecified: Secondary | ICD-10-CM | POA: Diagnosis not present

## 2022-06-01 DIAGNOSIS — E1122 Type 2 diabetes mellitus with diabetic chronic kidney disease: Secondary | ICD-10-CM | POA: Diagnosis not present

## 2022-06-01 DIAGNOSIS — Z01818 Encounter for other preprocedural examination: Secondary | ICD-10-CM | POA: Diagnosis not present

## 2022-06-01 DIAGNOSIS — Z1159 Encounter for screening for other viral diseases: Secondary | ICD-10-CM | POA: Diagnosis not present

## 2022-06-01 DIAGNOSIS — I129 Hypertensive chronic kidney disease with stage 1 through stage 4 chronic kidney disease, or unspecified chronic kidney disease: Secondary | ICD-10-CM | POA: Diagnosis not present

## 2022-06-01 DIAGNOSIS — Z114 Encounter for screening for human immunodeficiency virus [HIV]: Secondary | ICD-10-CM | POA: Diagnosis not present

## 2022-06-01 DIAGNOSIS — Z79899 Other long term (current) drug therapy: Secondary | ICD-10-CM | POA: Diagnosis not present

## 2022-06-01 DIAGNOSIS — E1121 Type 2 diabetes mellitus with diabetic nephropathy: Secondary | ICD-10-CM | POA: Diagnosis not present

## 2022-06-01 DIAGNOSIS — E785 Hyperlipidemia, unspecified: Secondary | ICD-10-CM | POA: Diagnosis not present

## 2022-06-01 DIAGNOSIS — N184 Chronic kidney disease, stage 4 (severe): Secondary | ICD-10-CM | POA: Diagnosis not present

## 2022-06-05 DIAGNOSIS — E162 Hypoglycemia, unspecified: Secondary | ICD-10-CM | POA: Diagnosis not present

## 2022-06-05 DIAGNOSIS — R739 Hyperglycemia, unspecified: Secondary | ICD-10-CM | POA: Diagnosis not present

## 2022-06-05 DIAGNOSIS — R61 Generalized hyperhidrosis: Secondary | ICD-10-CM | POA: Diagnosis not present

## 2022-06-07 DIAGNOSIS — N184 Chronic kidney disease, stage 4 (severe): Secondary | ICD-10-CM | POA: Diagnosis not present

## 2022-06-07 NOTE — Unmapped (Signed)
[facility] Refill Coordination Note    Specialty Medication(s) to be Shipped:   Neurology: Gary Rogers    Other medication(s) to be shipped: No additional medications requested for fill at this time     Gary Rogers, DOB: 04/09/1956  Phone: [phone] (home)       All above HIPAA information was verified with patient.     Was a Nurse, learning disability used for this call? No    Completed refill call assessment today to schedule patient's medication shipment from the [facility] [phone]).  All relevant notes have been reviewed.     Specialty medication(s) and dose(s) confirmed: Regimen is correct and unchanged.   Changes to medications: Gary Rogers reports no changes at this time.  Changes to insurance: No  New side effects reported not previously addressed with a pharmacist or physician: None reported  Questions for the pharmacist: No    Confirmed patient received a Conservation officer, historic buildings and a Surveyor, mining with first shipment. The patient will receive a drug information handout for each medication shipped and additional FDA Medication Guides as required.       DISEASE/MEDICATION-SPECIFIC INFORMATION        N/A    SPECIALTY MEDICATION ADHERENCE     Medication Adherence    Patient reported X missed doses in the last month: 0  Specialty Medication: AUSTEDO 6 mg  Patient is on additional specialty medications: No  Patient is on more than two specialty medications: No  Any gaps in refill history greater than 2 weeks in the last 3 months: no  Demonstrates understanding of importance of adherence: yes              Were doses missed due to medication being on hold? No    Austedo 6 mg: 14 days of medicine on hand       REFERRAL TO PHARMACIST     Referral to the pharmacist: Not needed      [facility]     Shipping address confirmed in Epic.     Delivery Scheduled: Yes, Expected medication delivery date: 06/19/22.     Medication will be delivered via UPS to the prescription address in Epic WAM.    Gary Rogers   [facility] Specialty Technician

## 2022-06-12 NOTE — Telephone Encounter (Signed)
Thanks, which [facility]?

## 2022-06-12 NOTE — Telephone Encounter (Signed)
Pt returned call and it is [facility] in [place] where he wants Rx sent

## 2022-06-12 NOTE — Telephone Encounter (Signed)
Phoned and LMOVM for the pt to return call with pharmacy details

## 2022-06-14 ENCOUNTER — Ambulatory Visit (HOSPITAL_COMMUNITY)
Admission: RE | Admit: 2022-06-14 | Discharge: 2022-06-14 | Disposition: A | Discharge status: Home / Self Care | Payer: Medicare Other | Source: Ambulatory Visit | Attending: Nephrology | Admitting: Nephrology

## 2022-06-14 VITALS — BP 160/79 | HR 78 | Temp 97.4°F | Resp 20

## 2022-06-14 DIAGNOSIS — N183 Chronic kidney disease, stage 3 unspecified: Secondary | ICD-10-CM | POA: Insufficient documentation

## 2022-06-14 DIAGNOSIS — D631 Anemia in chronic kidney disease: Secondary | ICD-10-CM | POA: Insufficient documentation

## 2022-06-14 LAB — RENAL FUNCTION PANEL
Albumin: 3.5 g/dL (ref 3.5–5.0)
Anion gap: 10 (ref 5–15)
BUN: 132 mg/dL — ABNORMAL HIGH (ref 8–23)
CO2: 24 mmol/L (ref 22–32)
Calcium: 8.6 mg/dL — ABNORMAL LOW (ref 8.9–10.3)
Chloride: 104 mmol/L (ref 98–111)
Creatinine, Ser: 3.95 mg/dL — ABNORMAL HIGH (ref 0.61–1.24)
GFR, Estimated: 16 mL/min — ABNORMAL LOW (ref >60)
Glucose, Bld: 97 mg/dL (ref 70–99)
Phosphorus: 7.2 mg/dL — ABNORMAL HIGH (ref 2.5–4.6)
Potassium: 4.6 mmol/L (ref 3.5–5.1)
Sodium: 138 mmol/L (ref 135–145)

## 2022-06-14 LAB — CBC WITH DIFFERENTIAL/PLATELET
Abs Immature Granulocytes: 0.05 10*3/uL (ref 0.00–0.07)
Basophils Absolute: 0 10*3/uL (ref 0.0–0.1)
Basophils Relative: 0 %
Eosinophils Absolute: 0.1 10*3/uL (ref 0.0–0.5)
Eosinophils Relative: 1 %
HCT: 36.7 % — ABNORMAL LOW (ref 39.0–52.0)
Hemoglobin: 11.7 g/dL — ABNORMAL LOW (ref 13.0–17.0)
Immature Granulocytes: 1 %
Lymphocytes Relative: 24 %
Lymphs Abs: 1.9 10*3/uL (ref 0.7–4.0)
MCH: 28.2 pg (ref 26.0–34.0)
MCHC: 31.9 g/dL (ref 30.0–36.0)
MCV: 88.4 fL (ref 80.0–100.0)
Monocytes Absolute: 0.5 10*3/uL (ref 0.1–1.0)
Monocytes Relative: 7 %
Neutro Abs: 5.1 10*3/uL (ref 1.7–7.7)
Neutrophils Relative %: 67 %
Platelets: 352 10*3/uL (ref 150–400)
RBC: 4.15 MIL/uL — ABNORMAL LOW (ref 4.22–5.81)
RDW: 14.2 % (ref 11.5–15.5)
WBC: 7.7 10*3/uL (ref 4.0–10.5)
nRBC: 0 % (ref 0.0–0.2)

## 2022-06-14 LAB — IRON AND TIBC
Iron: 32 ug/dL — ABNORMAL LOW (ref 45–182)
Saturation Ratios: 11 % — ABNORMAL LOW (ref 17.9–39.5)
TIBC: 281 ug/dL (ref 250–450)
UIBC: 249 ug/dL

## 2022-06-14 LAB — POCT HEMOGLOBIN-HEMACUE: Hemoglobin: 11.3 g/dL — ABNORMAL LOW (ref 13.0–17.0)

## 2022-06-14 LAB — FERRITIN: Ferritin: 204 ng/mL (ref 24–336)

## 2022-06-14 MED ORDER — EPOETIN ALFA 40000 UNIT/ML IJ SOLN
40000.0000 [IU] | INTRAMUSCULAR | Status: DC
  Administered 2022-06-14: 40000 [IU] via SUBCUTANEOUS

## 2022-06-14 MED ORDER — EPOETIN ALFA 40000 UNIT/ML IJ SOLN
INTRAMUSCULAR | Status: AC
  Filled 2022-06-14: qty 1

## 2022-06-17 MED FILL — AUSTEDO 6 MG TABLET: ORAL | 30 days supply | Qty: 60 | Fill #2

## 2022-06-18 NOTE — Telephone Encounter (Signed)
Called pt to reschedule from 3/4 d/t provider schedule change. No answer, lf vm.

## 2022-06-20 DIAGNOSIS — N179 Acute kidney failure, unspecified: Secondary | ICD-10-CM | POA: Diagnosis not present

## 2022-06-20 DIAGNOSIS — E875 Hyperkalemia: Secondary | ICD-10-CM | POA: Diagnosis not present

## 2022-06-20 DIAGNOSIS — D631 Anemia in chronic kidney disease: Secondary | ICD-10-CM | POA: Diagnosis not present

## 2022-06-20 DIAGNOSIS — I770 Arteriovenous fistula, acquired: Secondary | ICD-10-CM | POA: Diagnosis not present

## 2022-06-20 DIAGNOSIS — E1122 Type 2 diabetes mellitus with diabetic chronic kidney disease: Secondary | ICD-10-CM | POA: Diagnosis not present

## 2022-06-20 DIAGNOSIS — Z79899 Other long term (current) drug therapy: Secondary | ICD-10-CM | POA: Diagnosis not present

## 2022-06-20 DIAGNOSIS — N185 Chronic kidney disease, stage 5: Secondary | ICD-10-CM | POA: Diagnosis not present

## 2022-06-20 DIAGNOSIS — I120 Hypertensive chronic kidney disease with stage 5 chronic kidney disease or end stage renal disease: Secondary | ICD-10-CM | POA: Diagnosis not present

## 2022-06-20 DIAGNOSIS — Z940 Kidney transplant status: Secondary | ICD-10-CM | POA: Diagnosis not present

## 2022-06-20 DIAGNOSIS — E872 Acidosis, unspecified: Secondary | ICD-10-CM | POA: Diagnosis not present

## 2022-06-25 ENCOUNTER — Other Ambulatory Visit: Payer: Self-pay

## 2022-06-25 ENCOUNTER — Encounter (HOSPITAL_COMMUNITY): Payer: Self-pay | Admitting: Vascular Surgery

## 2022-06-25 NOTE — Progress Notes (Signed)
PCP - Celene Squibb, MD Nephrologist - Donato Heinz, MD Neurologist - Siri Cole, FNP Transplant Team at [place]   Chest x-ray - 03/11/22 EKG - 03/14/22 ECHO - 03/10/22  Fasting Blood Sugar - 100 Checks Blood Sugar 1/hr  Anesthesia review: Y  Patient verbally denies any shortness of breath, fever, cough and chest pain during phone call   -------------  SDW INSTRUCTIONS given:  Your procedure is scheduled on Wednesday, 06/27/22.  Report to [facility] Main Entrance "A" at [place].M., and check in at the Admitting office.  Call this number if you have problems the morning of surgery:  [phone]   Remember:  Do not eat or drink after midnight the night before your surgery     Take these medicines the morning of surgery with A SIP OF WATER  amLODipine (NORVASC)  atorvastatin (LIPITOR)  carvedilol (COREG)  Deutetrabenazine (AUSTEDO)  hydrALAZINE (APRESOLINE)  levothyroxine (SYNTHROID)  mycophenolate (MYFORTIC)  predniSONE (DELTASONE)  tacrolimus (PROGRAF)   OMNIPOD INSULIN PUMP: Reduce basal rate 20% at midnight and also contact the prescriber to see if they have any additional instructions.  .** PLEASE check your blood sugar the morning of your surgery when you wake up and every 2 hours until you get to the Short Stay unit.  If your blood sugar is less than 70 mg/dL, you will need to treat for low blood sugar: Do not take insulin. Treat a low blood sugar (less than 70 mg/dL) with  cup of clear juice (cranberry or apple), 4 glucose tablets, OR glucose gel. Recheck blood sugar in 15 minutes after treatment (to make sure it is greater than 70 mg/dL). If your blood sugar is not greater than 70 mg/dL on recheck, call [phone] for further instructions.   As of today, STOP taking any Aspirin (unless otherwise instructed by your surgeon) Aleve, Naproxen, Ibuprofen, Motrin, Advil, Goody's, BC's, all herbal medications, fish oil, and all vitamins.                       Do not wear jewelry, make up, or nail polish            Do not wear lotions, powders, perfumes/colognes, or deodorant.            Do not shave 48 hours prior to surgery.  Men may shave face and neck.            Do not bring valuables to the hospital.            [facility] is not responsible for any belongings or valuables.  Do NOT Smoke (Tobacco/Vaping) 24 hours prior to your procedure If you use a CPAP at night, you may bring all equipment for your overnight stay.   Contacts, glasses, dentures or bridgework may not be worn into surgery.      For patients admitted to the hospital, discharge time will be determined by your treatment team.   Patients discharged the day of surgery will not be allowed to drive home, and someone needs to stay with them for 24 hours.    Special instructions:   [place]- Preparing For Surgery  Before surgery, you can play an important role. Because skin is not sterile, your skin needs to be as free of germs as possible. You can reduce the number of germs on your skin by washing with CHG (chlorahexidine gluconate) Soap before surgery.  CHG is an antiseptic cleaner which kills germs and bonds with the  skin to continue killing germs even after washing.    Oral Hygiene is also important to reduce your risk of infection.  Remember - BRUSH YOUR TEETH THE MORNING OF SURGERY WITH YOUR REGULAR TOOTHPASTE  Please do not use if you have an allergy to CHG or antibacterial soaps. If your skin becomes reddened/irritated stop using the CHG.  Do not shave (including legs and underarms) for at least 48 hours prior to first CHG shower. It is OK to shave your face.  Please follow these instructions carefully.   Shower the NIGHT BEFORE SURGERY and the MORNING OF SURGERY with DIAL Soap.   Pat yourself dry with a CLEAN TOWEL.  Wear CLEAN PAJAMAS to bed the night before surgery  Place CLEAN SHEETS on your bed the night of your first shower and DO NOT SLEEP WITH  PETS.   Day of Surgery: Please shower morning of surgery  Wear Clean/Comfortable clothing the morning of surgery Do not apply any deodorants/lotions.   Remember to brush your teeth WITH YOUR REGULAR TOOTHPASTE.   Questions were answered. Patient verbalized understanding of instructions.

## 2022-06-26 MED ORDER — RIFAXIMIN 550 MG PO TABS: 550.0000 mg | ORAL_TABLET | Freq: Three times a day (TID) | ORAL | 0 refills | Status: AC

## 2022-06-26 NOTE — Anesthesia Preprocedure Evaluation (Addendum)
Anesthesia Evaluation  Patient identified by MRN, date of birth, ID band Patient awake    Reviewed: Allergy & Precautions, NPO status , Patient's Chart, lab work & pertinent test results, reviewed documented beta blocker date and time   History of Anesthesia Complications (+) PONV and history of anesthetic complications  Airway Mallampati: II  TM Distance: >3 FB Neck ROM: Full    Dental  (+) Dental Advisory Given, Chipped   Pulmonary sleep apnea (does not use CPAP)    breath sounds clear to auscultation       Cardiovascular hypertension, Pt. on medications and Pt. on home beta blockers + Peripheral Vascular Disease   Rhythm:Regular Rate:Normal  02/2022 ECHO: EF 55-60%, normal LVF, Grade 2 DD, mildly reduced RVF, mild calcification and thickening of aortic valve   Neuro/Psych Choreaform movement disorder    GI/Hepatic negative GI ROS, Neg liver ROS,,,  Endo/Other  diabetes (glu 122), Insulin Dependent    Renal/GU ESRFRenal disease (not on dialysis yet)Failing renal transplant     Musculoskeletal   Abdominal   Peds  Hematology   Anesthesia Other Findings   Reproductive/Obstetrics                             Anesthesia Physical Anesthesia Plan  ASA: 3  Anesthesia Plan: Regional   Post-op Pain Management: Tylenol PO (pre-op)*   Induction:   PONV Risk Score and Plan: 2 and Ondansetron and Treatment may vary due to age or medical condition  Airway Management Planned: Natural Airway and Simple Face Mask  Additional Equipment: None  Intra-op Plan:   Post-operative Plan:   Informed Consent: I have reviewed the patients History and Physical, chart, labs and discussed the procedure including the risks, benefits and alternatives for the proposed anesthesia with the patient or authorized representative who has indicated his/her understanding and acceptance.     Dental advisory  given  Plan Discussed with: Surgeon and CRNA  Anesthesia Plan Comments: (PAT note written 06/26/2022 by Myra Gianotti, PA-C.  Plan routine monitors, Supraclav block)       Anesthesia Quick Evaluation

## 2022-06-26 NOTE — Telephone Encounter (Signed)
I am sorry, just now saw this. I have sent to [facility] in [place].

## 2022-06-26 NOTE — Progress Notes (Signed)
Anesthesia Chart Review: Kathleene Hazel  Case: [id number] Date/Time: 06/27/22 0815   Procedure: RIGHT BRACHIOCEPHALIC ARTERIOVENOUS (AV) FISTULA CREATION (Right)   Anesthesia type: Monitor Anesthesia Care   Pre-op diagnosis: Chronic Kidney Disese Stage 5   Location: [facility] OR ROOM 11 / [place] OR   Surgeons: Cherre Robins, MD       DISCUSSION: Patient is a 67 year old male scheduled for the above procedure.  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   A1c 5.7% on 02/22/22. He is on Novolog 100 units/mL via OmniPod and uses a Dexcom G6 continuous blood glucose monitor.   He is on immunosuppressive therapy given history of LDKT in 2010.   He is a same day work-up. Anesthesia team to evaluate on the day of surgery.   VS: Ht '5\' 10"'$  (1.778 m)   Wt 90.3 kg   BMI 28.55 kg/m  BP Readings from Last 3 Encounters:  06/14/22 (!) 160/79  05/22/22 131/70  05/22/22 115/68   Pulse Readings from Last 3 Encounters:  06/14/22 78  05/22/22 66  05/22/22 (!) 58     PROVIDERS: Celene Squibb, MD is PCP  Donato Heinz, MD is nephrologist. [facility] Nephrologist is Franklyn Lor, MD, last visit 06/07/22 and is being evaluated for 2nd transplant.  Siri Cole, NP is neurology provider [facility])   LABS: For day of surgery as indicated.    IMAGES: 1V PCXR 03/11/22: FINDINGS: Right internal jugular central venous catheter tip projects over the right clavicle. This is in the region of the distal jugular vein or brachiocephalic confluence. No pneumothorax. Low lung volumes. Similar appearance of  heterogeneous bilateral lung opacities. Stable cardiomegaly. Suspected small pleural effusions. IMPRESSION: 1. Right internal jugular central venous catheter with tip projecting over the right clavicle, in the region of the distal jugular vein or brachiocephalic confluence. No pneumothorax. 2. Stable bilateral heterogeneous opacities which may represent pulmonary edema, infection or combination there of. Stable cardiomegaly and pleural effusions.    EKG: 03/14/22: Normal sinus rhythm Low voltage QRS Borderline ECG When compared with ECG of 09-Mar-2022 14:08, PREVIOUS ECG IS PRESENT No significant change since last tracing Confirmed by Jenkins Rouge [phone]) on 03/14/2022 9:23:21 PM   CV: Echo 03/10/22 IMPRESSIONS   1. Left ventricular ejection fraction, by estimation, is 55 to 60%. The  left ventricle has normal function. The left ventricle has no regional  wall motion abnormalities. Left ventricular diastolic parameters are  consistent with Grade II diastolic  dysfunction (pseudonormalization). Elevated left ventricular end-diastolic  pressure.   2. Right ventricular systolic function is mildly reduced. The right  ventricular size is mildly enlarged.   3. Left atrial size was moderately dilated.   4. The mitral valve is abnormal. Trivial mitral valve regurgitation. No  evidence of mitral stenosis.   5. The aortic valve is tricuspid. There is mild calcification of the  aortic valve. There is mild thickening of the aortic valve. Aortic valve  regurgitation is not visualized. Aortic valve sclerosis is present, with  no evidence of aortic valve stenosis.   6. The inferior vena cava is normal in size with greater than 50%  respiratory variability, suggesting right atrial pressure  of 3 mmHg.    Past Medical History:  Diagnosis Date   Anemia    Takes iron shots once a month   Chorea    COVID-19    2021   Diabetes mellitus without complication (HCC)    Dyspnea    Essential  hypertension    Hypercholesteremia    Hypothyroidism    Neuromuscular disorder (HCC)    Pneumonia    PONV (postoperative nausea and vomiting)    Renal failure    Has had kidney transplant 2008   Sleep apnea    Mild. Does not require CPAP use    Past Surgical History:  Procedure Laterality Date   AMPUTATION Right 06/27/2016   Procedure: AMPUTATION 5TH TOE RIGHT FOOT;  Surgeon: Caprice Beaver, DPM;  Location: [facility] ORS;  Service: Podiatry;  Laterality: Right;   AMPUTATION TOE Left 09/02/2015   Procedure: AMPUTATION  2ND TOE LEFT FOOT;  Surgeon: Aviva Signs, MD;  Location: [facility] ORS;  Service: General;  Laterality: Left;   BIOPSY  11/24/2021   Procedure: BIOPSY;  Surgeon: Daneil Dolin, MD;  Location: [facility] ENDO SUITE;  Service: Endoscopy;;   CATARACT EXTRACTION Bilateral    COLONOSCOPY  01/2014   done in [place]. patient reports h/o colon polyps on TCS in 2010, 2015 colonoscopy normal but advised to come back in 2020.    COLONOSCOPY WITH PROPOFOL N/A 01/19/2019   Procedure: COLONOSCOPY WITH PROPOFOL;  Surgeon: Daneil Dolin, MD;  Location: [facility] ENDO SUITE;  Service: Endoscopy;  Laterality: N/A;  9:45am   COLONOSCOPY WITH PROPOFOL N/A 11/24/2021   pancolonic diverticulosis, three polyps, adenomas, negative microscopic colitis   IR FLUORO GUIDE CV LINE RIGHT  03/14/2022   IR PERC TUN PERIT CATH WO PORT S&I /IMAG  03/14/2022   IR REMOVAL TUN CV CATH W/O FL  03/19/2022   IR US GUIDE VASC ACCESS RIGHT  03/14/2022   KIDNEY TRANSPLANT Right 2010   POLYPECTOMY  01/19/2019   Procedure: POLYPECTOMY;  Surgeon: Daneil Dolin, MD;  Location: [facility] END[facility] SU[facility];  Service: Endoscopy;;   POLYPECTOMY  11/24/2021   Procedure: POLYPECTOMY;  Surgeon: Daneil Dolin, MD;  Location: [facility] ENDO SUITE;  Service: Endoscopy;;    MEDICATIONS: No current facility-administered medications for this encounter.    amLODipine (NORVASC) 10 MG tablet   atorvastatin (LIPITOR) 10 MG tablet   carvedilol (COREG) 6.25 MG  tablet   Deutetrabenazine (AUSTEDO) 6 MG TABS   furosemide (LASIX) 80 MG tablet   GVOKE HYPOPEN 2-PACK 1 MG/0.2ML SOAJ   hydrALAZINE (APRESOLINE) 50 MG tablet   insulin aspart (NOVOLOG) 100 UNIT/ML injection   levothyroxine (SYNTHROID) 125 MCG tablet   mycophenolate (MYFORTIC) 180 MG EC tablet   predniSONE (DELTASONE) 5 MG tablet   sodium bicarbonate 650 MG tablet   tacrolimus (PROGRAF) 0.5 MG capsule   Insulin Disposable Pump (OMNIPOD DASH PODS, GEN 4,) MISC   OVER THE COUNTER MEDICATION   QUEtiapine (SEROQUEL) 25 MG tablet    Myra Gianotti, PA-C Surgical Short Stay/Anesthesiology [facility] Phone [phone] [facility] Phone [phone] 06/26/2022 12:31 PM

## 2022-06-26 NOTE — Addendum Note (Signed)
Addended by: Annitta Needs on: 06/26/2022 12:08 PM   Modules accepted: Orders

## 2022-06-27 ENCOUNTER — Other Ambulatory Visit: Payer: Self-pay

## 2022-06-27 ENCOUNTER — Ambulatory Visit (HOSPITAL_BASED_OUTPATIENT_CLINIC_OR_DEPARTMENT_OTHER): Payer: Medicare Other | Admitting: Vascular Surgery

## 2022-06-27 ENCOUNTER — Ambulatory Visit (HOSPITAL_COMMUNITY)
Admission: RE | Admit: 2022-06-27 | Discharge: 2022-06-27 | Disposition: A | Discharge status: Home / Self Care | Payer: Medicare Other | Attending: Vascular Surgery | Admitting: Vascular Surgery

## 2022-06-27 ENCOUNTER — Encounter (HOSPITAL_COMMUNITY)
Admission: RE | Disposition: A | Discharge status: Home / Self Care | Payer: Self-pay | Source: Home / Self Care | Attending: Vascular Surgery

## 2022-06-27 ENCOUNTER — Ambulatory Visit (HOSPITAL_COMMUNITY): Payer: Medicare Other | Admitting: Vascular Surgery

## 2022-06-27 ENCOUNTER — Encounter (HOSPITAL_COMMUNITY): Payer: Self-pay | Admitting: Vascular Surgery

## 2022-06-27 DIAGNOSIS — Z940 Kidney transplant status: Secondary | ICD-10-CM | POA: Diagnosis not present

## 2022-06-27 DIAGNOSIS — E7800 Pure hypercholesterolemia, unspecified: Secondary | ICD-10-CM | POA: Insufficient documentation

## 2022-06-27 DIAGNOSIS — E1122 Type 2 diabetes mellitus with diabetic chronic kidney disease: Secondary | ICD-10-CM

## 2022-06-27 DIAGNOSIS — E039 Hypothyroidism, unspecified: Secondary | ICD-10-CM | POA: Diagnosis not present

## 2022-06-27 DIAGNOSIS — I120 Hypertensive chronic kidney disease with stage 5 chronic kidney disease or end stage renal disease: Secondary | ICD-10-CM | POA: Insufficient documentation

## 2022-06-27 DIAGNOSIS — N185 Chronic kidney disease, stage 5: Secondary | ICD-10-CM | POA: Diagnosis not present

## 2022-06-27 DIAGNOSIS — Z794 Long term (current) use of insulin: Secondary | ICD-10-CM

## 2022-06-27 DIAGNOSIS — N186 End stage renal disease: Secondary | ICD-10-CM | POA: Diagnosis not present

## 2022-06-27 DIAGNOSIS — E1151 Type 2 diabetes mellitus with diabetic peripheral angiopathy without gangrene: Secondary | ICD-10-CM | POA: Diagnosis not present

## 2022-06-27 DIAGNOSIS — G4730 Sleep apnea, unspecified: Secondary | ICD-10-CM | POA: Diagnosis not present

## 2022-06-27 HISTORY — PX: AV FISTULA PLACEMENT: SHX1204

## 2022-06-27 HISTORY — DX: Dyspnea, unspecified: R06.00

## 2022-06-27 HISTORY — DX: Pneumonia, unspecified organism: J18.9

## 2022-06-27 HISTORY — DX: Sleep apnea, unspecified: G47.30

## 2022-06-27 HISTORY — DX: Anemia, unspecified: D64.9

## 2022-06-27 HISTORY — DX: COVID-19: U07.1

## 2022-06-27 LAB — POCT I-STAT, CHEM 8
BUN: >130 mg/dL — ABNORMAL HIGH (ref 8–23)
BUN: >130 mg/dL — ABNORMAL HIGH (ref 8–23)
BUN: >130 mg/dL — ABNORMAL HIGH (ref 8–23)
Calcium, Ion: 1.11 mmol/L — ABNORMAL LOW (ref 1.15–1.40)
Calcium, Ion: 1.13 mmol/L — ABNORMAL LOW (ref 1.15–1.40)
Calcium, Ion: 1.09 mmol/L — ABNORMAL LOW (ref 1.15–1.40)
Chloride: 105 mmol/L (ref 98–111)
Chloride: 106 mmol/L (ref 98–111)
Chloride: 105 mmol/L (ref 98–111)
Creatinine, Ser: 4.6 mg/dL — ABNORMAL HIGH (ref 0.61–1.24)
Creatinine, Ser: 4.7 mg/dL — ABNORMAL HIGH (ref 0.61–1.24)
Creatinine, Ser: 4.6 mg/dL — ABNORMAL HIGH (ref 0.61–1.24)
Glucose, Bld: 123 mg/dL — ABNORMAL HIGH (ref 70–99)
Glucose, Bld: 124 mg/dL — ABNORMAL HIGH (ref 70–99)
Glucose, Bld: 122 mg/dL — ABNORMAL HIGH (ref 70–99)
HCT: 33 % — ABNORMAL LOW (ref 39.0–52.0)
HCT: 35 % — ABNORMAL LOW (ref 39.0–52.0)
HCT: 35 % — ABNORMAL LOW (ref 39.0–52.0)
Hemoglobin: 11.2 g/dL — ABNORMAL LOW (ref 13.0–17.0)
Hemoglobin: 11.9 g/dL — ABNORMAL LOW (ref 13.0–17.0)
Hemoglobin: 11.9 g/dL — ABNORMAL LOW (ref 13.0–17.0)
Potassium: 3.8 mmol/L (ref 3.5–5.1)
Potassium: 3.8 mmol/L (ref 3.5–5.1)
Potassium: 3.8 mmol/L (ref 3.5–5.1)
Sodium: 141 mmol/L (ref 135–145)
Sodium: 141 mmol/L (ref 135–145)
Sodium: 141 mmol/L (ref 135–145)
TCO2: 22 mmol/L (ref 22–32)
TCO2: 22 mmol/L (ref 22–32)
TCO2: 21 mmol/L — ABNORMAL LOW (ref 22–32)

## 2022-06-27 LAB — GLUCOSE, CAPILLARY
Glucose-Capillary: 122 mg/dL — ABNORMAL HIGH (ref 70–99)
Glucose-Capillary: 147 mg/dL — ABNORMAL HIGH (ref 70–99)
Glucose-Capillary: 164 mg/dL — ABNORMAL HIGH (ref 70–99)

## 2022-06-27 SURGERY — ARTERIOVENOUS (AV) FISTULA CREATION
Anesthesia: Regional | Site: Arm Upper | Laterality: Right

## 2022-06-27 MED ORDER — OXYCODONE HCL 5 MG PO TABS
5.0000 mg | ORAL_TABLET | Freq: Once | ORAL | Status: AC | PRN
  Administered 2022-06-27: 5 mg via ORAL

## 2022-06-27 MED ORDER — ORAL CARE MOUTH RINSE: 15.0000 mL | Freq: Once | OROMUCOSAL | Status: AC

## 2022-06-27 MED ORDER — HEPARIN 6000 UNIT IRRIGATION SOLUTION
Status: AC
  Filled 2022-06-27: qty 500

## 2022-06-27 MED ORDER — CHLORHEXIDINE GLUCONATE 4 % EX LIQD: 60.0000 mL | Freq: Once | CUTANEOUS | Status: DC

## 2022-06-27 MED ORDER — PAPAVERINE HCL 30 MG/ML IJ SOLN
INTRAMUSCULAR | Status: AC
  Filled 2022-06-27: qty 2

## 2022-06-27 MED ORDER — PROPOFOL 10 MG/ML IV BOLUS
INTRAVENOUS | Status: DC | PRN
  Administered 2022-06-27 (×2): 10 mg via INTRAVENOUS

## 2022-06-27 MED ORDER — HEPARIN 6000 UNIT IRRIGATION SOLUTION
Status: DC | PRN
  Administered 2022-06-27: 1

## 2022-06-27 MED ORDER — LIDOCAINE-EPINEPHRINE (PF) 1.5 %-1:200000 IJ SOLN
INTRAMUSCULAR | Status: DC | PRN
  Administered 2022-06-27: 25 mL via PERINEURAL

## 2022-06-27 MED ORDER — ONDANSETRON HCL 4 MG/2ML IJ SOLN
INTRAMUSCULAR | Status: DC | PRN
  Administered 2022-06-27: 4 mg via INTRAVENOUS

## 2022-06-27 MED ORDER — OXYCODONE HCL 5 MG PO TABS
ORAL_TABLET | ORAL | Status: AC
  Filled 2022-06-27: qty 1

## 2022-06-27 MED ORDER — PROPOFOL 500 MG/50ML IV EMUL
INTRAVENOUS | Status: DC | PRN
  Administered 2022-06-27: 125 ug/kg/min via INTRAVENOUS

## 2022-06-27 MED ORDER — CEFAZOLIN SODIUM-DEXTROSE 2-4 GM/100ML-% IV SOLN
2.0000 g | INTRAVENOUS | Status: AC
  Administered 2022-06-27: 2 g via INTRAVENOUS
  Filled 2022-06-27: qty 100

## 2022-06-27 MED ORDER — PROMETHAZINE HCL 25 MG/ML IJ SOLN: 6.2500 mg | INTRAMUSCULAR | Status: DC | PRN

## 2022-06-27 MED ORDER — MIDAZOLAM HCL 2 MG/2ML IJ SOLN
INTRAMUSCULAR | Status: DC | PRN
  Administered 2022-06-27: 1 mg via INTRAVENOUS

## 2022-06-27 MED ORDER — FENTANYL CITRATE (PF) 250 MCG/5ML IJ SOLN
INTRAMUSCULAR | Status: DC | PRN
  Administered 2022-06-27: 25 ug via INTRAVENOUS

## 2022-06-27 MED ORDER — PROPOFOL 10 MG/ML IV BOLUS
INTRAVENOUS | Status: AC
  Filled 2022-06-27: qty 20

## 2022-06-27 MED ORDER — CHLORHEXIDINE GLUCONATE 0.12 % MT SOLN
OROMUCOSAL | Status: AC
  Administered 2022-06-27: 15 mL via OROMUCOSAL
  Filled 2022-06-27: qty 15

## 2022-06-27 MED ORDER — MIDAZOLAM HCL 2 MG/2ML IJ SOLN
INTRAMUSCULAR | Status: AC
  Filled 2022-06-27: qty 2

## 2022-06-27 MED ORDER — OXYCODONE HCL 5 MG/5ML PO SOLN: 5.0000 mg | Freq: Once | ORAL | Status: AC | PRN

## 2022-06-27 MED ORDER — LIDOCAINE-EPINEPHRINE (PF) 1 %-1:200000 IJ SOLN
INTRAMUSCULAR | Status: AC
  Filled 2022-06-27: qty 30

## 2022-06-27 MED ORDER — FENTANYL CITRATE (PF) 250 MCG/5ML IJ SOLN
INTRAMUSCULAR | Status: AC
  Filled 2022-06-27: qty 5

## 2022-06-27 MED ORDER — MIDAZOLAM HCL 2 MG/2ML IJ SOLN: 0.5000 mg | Freq: Once | INTRAMUSCULAR | Status: DC | PRN

## 2022-06-27 MED ORDER — ACETAMINOPHEN 500 MG PO TABS
1000.0000 mg | ORAL_TABLET | Freq: Once | ORAL | Status: AC
  Administered 2022-06-27: 1000 mg via ORAL
  Filled 2022-06-27: qty 2

## 2022-06-27 MED ORDER — SODIUM CHLORIDE 0.9 % IV SOLN: INTRAVENOUS | Status: DC

## 2022-06-27 MED ORDER — PHENYLEPHRINE HCL-NACL 20-0.9 MG/250ML-% IV SOLN
INTRAVENOUS | Status: DC | PRN
  Administered 2022-06-27: 25 ug/min via INTRAVENOUS

## 2022-06-27 MED ORDER — MEPERIDINE HCL 25 MG/ML IJ SOLN: 6.2500 mg | INTRAMUSCULAR | Status: DC | PRN

## 2022-06-27 MED ORDER — 0.9 % SODIUM CHLORIDE (POUR BTL) OPTIME
TOPICAL | Status: DC | PRN
  Administered 2022-06-27: 1000 mL

## 2022-06-27 MED ORDER — ONDANSETRON HCL 4 MG/2ML IJ SOLN
INTRAMUSCULAR | Status: AC
  Filled 2022-06-27: qty 2

## 2022-06-27 MED ORDER — PHENYLEPHRINE 80 MCG/ML (10ML) SYRINGE FOR IV PUSH (FOR BLOOD PRESSURE SUPPORT)
PREFILLED_SYRINGE | INTRAVENOUS | Status: DC | PRN
  Administered 2022-06-27 (×4): 80 ug via INTRAVENOUS

## 2022-06-27 MED ORDER — PHENYLEPHRINE 80 MCG/ML (10ML) SYRINGE FOR IV PUSH (FOR BLOOD PRESSURE SUPPORT)
PREFILLED_SYRINGE | INTRAVENOUS | Status: AC
  Filled 2022-06-27: qty 10

## 2022-06-27 MED ORDER — CHLORHEXIDINE GLUCONATE 0.12 % MT SOLN: 15.0000 mL | Freq: Once | OROMUCOSAL | Status: AC

## 2022-06-27 MED ORDER — INSULIN ASPART 100 UNIT/ML IJ SOLN: 0.0000 [IU] | INTRAMUSCULAR | Status: DC | PRN

## 2022-06-27 MED ORDER — LACTATED RINGERS IV SOLN: INTRAVENOUS | Status: DC

## 2022-06-27 MED ORDER — FENTANYL CITRATE (PF) 100 MCG/2ML IJ SOLN: 25.0000 ug | INTRAMUSCULAR | Status: DC | PRN

## 2022-06-27 MED ORDER — TRAMADOL HCL 50 MG PO TABS: 50.0000 mg | ORAL_TABLET | Freq: Four times a day (QID) | ORAL | 0 refills | Status: DC | PRN

## 2022-06-27 SURGICAL SUPPLY — 36 items
APL PRP STRL LF DISP 70% ISPRP (MISCELLANEOUS) ×1
APL SKNCLS STERI-STRIP NONHPOA (GAUZE/BANDAGES/DRESSINGS) ×1
ARMBAND PINK RESTRICT EXTREMIT (MISCELLANEOUS) ×1 IMPLANT
BENZOIN TINCTURE PRP APPL 2/3 (GAUZE/BANDAGES/DRESSINGS) ×1 IMPLANT
CANISTER SUCT 3000ML PPV (MISCELLANEOUS) ×1 IMPLANT
CANNULA VESSEL 3MM 2 BLNT TIP (CANNULA) ×1 IMPLANT
CHLORAPREP W/TINT 26 (MISCELLANEOUS) ×1 IMPLANT
CLIP LIGATING EXTRA MED SLVR (CLIP) ×1 IMPLANT
CLIP LIGATING EXTRA SM BLUE (MISCELLANEOUS) ×1 IMPLANT
COVER PROBE W GEL 5X96 (DRAPES) IMPLANT
ELECT REM PT RETURN 9FT ADLT (ELECTROSURGICAL) ×1
ELECTRODE REM PT RTRN 9FT ADLT (ELECTROSURGICAL) ×1 IMPLANT
GLOVE BIO SURGEON STRL SZ8 (GLOVE) ×1 IMPLANT
GOWN STRL REUS W/ TWL LRG LVL3 (GOWN DISPOSABLE) ×2 IMPLANT
GOWN STRL REUS W/ TWL XL LVL3 (GOWN DISPOSABLE) ×1 IMPLANT
GOWN STRL REUS W/TWL LRG LVL3 (GOWN DISPOSABLE) ×2
GOWN STRL REUS W/TWL XL LVL3 (GOWN DISPOSABLE) ×1
INSERT FOGARTY SM (MISCELLANEOUS) IMPLANT
KIT BASIN OR (CUSTOM PROCEDURE TRAY) ×1 IMPLANT
KIT TURNOVER KIT B (KITS) ×1 IMPLANT
NDL 18GX1X1/2 (RX/OR ONLY) (NEEDLE) IMPLANT
NEEDLE 18GX1X1/2 (RX/OR ONLY) (NEEDLE) IMPLANT
NS IRRIG 1000ML POUR BTL (IV SOLUTION) ×1 IMPLANT
PACK CV ACCESS (CUSTOM PROCEDURE TRAY) ×1 IMPLANT
PAD ARMBOARD 7.5X6 YLW CONV (MISCELLANEOUS) ×2 IMPLANT
SLING ARM FOAM STRAP LRG (SOFTGOODS) IMPLANT
SLING ARM FOAM STRAP MED (SOFTGOODS) IMPLANT
STRIP CLOSURE SKIN 1/2X4 (GAUZE/BANDAGES/DRESSINGS) ×1 IMPLANT
SUT MNCRL AB 4-0 PS2 18 (SUTURE) ×1 IMPLANT
SUT PROLENE 6 0 BV (SUTURE) ×1 IMPLANT
SUT VIC AB 3-0 SH 27 (SUTURE) ×1
SUT VIC AB 3-0 SH 27X BRD (SUTURE) ×1 IMPLANT
SYR 3ML LL SCALE MARK (SYRINGE) IMPLANT
TOWEL GREEN STERILE (TOWEL DISPOSABLE) ×1 IMPLANT
UNDERPAD 30X36 HEAVY ABSORB (UNDERPADS AND DIAPERS) ×1 IMPLANT
WATER STERILE IRR 1000ML POUR (IV SOLUTION) ×1 IMPLANT

## 2022-06-27 NOTE — Op Note (Signed)
DATE OF SERVICE: 06/27/2022  PATIENT:  Nathan Tyler  67 y.o. male  PRE-OPERATIVE DIAGNOSIS:  ESRD  POST-OPERATIVE DIAGNOSIS:  Same  PROCEDURE:   Right brachiocephalic arteriovenous fistula creation  SURGEON:  Surgeon(s) and Role:    * Cherre Robins, MD - Primary  ASSISTANT: Vicente Serene, PA-C  An experienced assistant was required given the complexity of this procedure and the standard of surgical care. My assistant helped with exposure through counter tension, suctioning, ligation and retraction to better visualize the surgical field.  My assistant expedited sewing during the case by following my sutures. Wherever I use the term "we" in the report, my assistant actively helped me with that portion of the procedure.  ANESTHESIA:   regional and MAC  EBL: minimal  BLOOD ADMINISTERED:none  DRAINS: none   LOCAL MEDICATIONS USED:  NONE  SPECIMEN:  none  COUNTS: confirmed correct.  TOURNIQUET:  none  PATIENT DISPOSITION:  PACU - hemodynamically stable.   Delay start of Pharmacological VTE agent (>24hrs) due to surgical blood loss or risk of bleeding: no  INDICATION FOR PROCEDURE: Nathan Tyler is a 67 y.o. male with ESRD and failing kidney transplant. After careful discussion of risks, benefits, and alternatives the patient was offered right arm arteriovenous fistula creation. The patient understood and wished to proceed.  OPERATIVE FINDINGS: healthy brachial artery. Healthy cephalic vein. Good technical result. Good doppler flow in right wrist at completion. Good doppler bruit in fistula at completion.  DESCRIPTION OF PROCEDURE: After identification of the patient in the pre-operative holding area, the patient was transferred to the operating room. The patient was positioned supine on the operating room table. Anesthesia was induced. The right arm was prepped and draped in standard fashion. A surgical pause was performed confirming correct patient, procedure, and operative  location.  Using intraoperative ultrasound, the course of the right upper extremity superficial veins was mapped.  The cephalic vein appeared adequate for arteriovenous fistula creation.  The brachial artery was similarly mapped.  The artery appeared adequate for arterial venous creation. We ensured there was no anomalous arterial anatomy such as a high bifurcation.  A transverse incision was made in the right arm just distal to the antecubital crease.  Incision was carried down through subcutaneous tissue until the cephalic vein was identified and skeletonized.  We continued our exposure through the aponeurosis of the biceps.  The brachial artery was encountered its usual position.  The artery was circumferentially exposed and encircled with Silastic Vesseloops.  Patient was systemically heparinized.  The distal cephalic vein was transected.  The distal stump of the cephalic vein was oversewn with a 2-0 silk suture.  The proximal vein was controlled with a bulldog clamp.  The brachial artery was clamped proximally medially.  An anterior arteriotomy was made with a 11 blade.  The arteriotomy was extended with Potts scissors.  Using a parachute technique the cephalic vein was anastomosed to the brachial arteriotomy in end-to-side fashion with continuous running suture of 6-0 Prolene.  Immediately prior to completion the anastomosis was flushed and de-aired.  The anastomosis was completed.  Hemostasis was assured.  The fistula was interrogated with Doppler.  Audible bruit was heard throughout the course of the cephalic vein.  A right radial artery signal was heard which augmented slightly with compression of the fistula.  Upon completion of the case instrument and sharps counts were confirmed correct. The patient was transferred to the PACU in good condition. I was present for all portions of  the procedure.  Yevonne Aline. Stanford Breed, MD [facility] Phone Number: [phone] 06/27/2022 9:23 AM

## 2022-06-27 NOTE — Anesthesia Procedure Notes (Signed)
Anesthesia Regional Block: Supraclavicular block   Pre-Anesthetic Checklist: , timeout performed,  Correct Patient, Correct Site, Correct Laterality,  Correct Procedure, Correct Position, site marked,  Risks and benefits discussed,  Surgical consent,  Pre-op evaluation,  At surgeon's request and post-op pain management  Laterality: Right and Upper  Prep: chloraprep       Needles:  Injection technique: Single-shot  Needle Type: Echogenic Needle     Needle Length: 9cm  Needle Gauge: 21     Additional Needles:   Procedures:,,,, ultrasound used (permanent image in chart),,    Narrative:  Start time: 06/27/2022 7:40 AM End time: 06/27/2022 7:46 AM Injection made incrementally with aspirations every 5 mL.  Performed by: Personally  Anesthesiologist: Annye Asa, MD  Additional Notes: Pt identified in Holding room.  Monitors applied. Working IV access confirmed. Sterile prep R clavicle and neck.  #21ga ECHOgenic Arrow block needle to supraclav brachial plexus with US guidance.  25cc 1.5% Lidocaine 1:200k epi injected incrementally after negative test dose.  Patient asymptomatic, VSS, no heme aspirated, tolerated well.   Jenita Seashore, MD

## 2022-06-27 NOTE — Anesthesia Procedure Notes (Signed)
Procedure Name: MAC Date/Time: 06/27/2022 8:28 AM  Performed by: Janene Harvey, CRNAPre-anesthesia Checklist: Patient identified, Emergency Drugs available, Suction available and Patient being monitored Patient Re-evaluated:Patient Re-evaluated prior to induction Oxygen Delivery Method: Simple face mask Induction Type: IV induction Placement Confirmation: positive ETCO2 Dental Injury: Teeth and Oropharynx as per pre-operative assessment

## 2022-06-27 NOTE — H&P (Signed)
[facility] OF [place]  ASSESSMENT / PLAN: Nathan Tyler is a 67 y.o. left handed male in need of permanent dialysis access. I reviewed options for dialysis in detail with the patient, including hemodialysis and peritoneal dialysis. I counseled the patient to ask their nephrologist about their candidacy for renal transplant. I counseled the patient that dialysis access requires surveillance and periodic maintenance. Plan to proceed with right brachiocephalic arteriovenous fistula in near future.   CHIEF COMPLAINT: worsening renal function  HISTORY OF PRESENT ILLNESS: Nathan Tyler is a 67 y.o. male who presents to clinic for discussion of permanent dialysis access. He is left handed. He has never had permanent dialysis access before. He previously had a renal transplant that worked well for him for 13 years. Unfortunately, the graft is starting to fail.   Past Medical History:  Diagnosis Date   Anemia    Takes iron shots once a month   Chorea    COVID-19    2021   Diabetes mellitus without complication (HCC)    Dyspnea    Essential hypertension    Hypercholesteremia    Hypothyroidism    Neuromuscular disorder (HCC)    Pneumonia    PONV (postoperative nausea and vomiting)    Renal failure    Has had kidney transplant 2008   Sleep apnea    Mild. Does not require CPAP use    Past Surgical History:  Procedure Laterality Date   AMPUTATION Right 06/27/2016   Procedure: AMPUTATION 5TH TOE RIGHT FOOT;  Surgeon: Caprice Beaver, DPM;  Location: [facility];  Service: Podiatry;  Laterality: Right;   AMPUTATION TOE Left 09/02/2015   Procedure: AMPUTATION  2ND TOE LEFT FOOT;  Surgeon: Aviva Signs, MD;  Location: [facility];  Service: General;  Laterality: Left;   BIOPSY  11/24/2021   Procedure: BIOPSY;  Surgeon: Daneil Dolin, MD;  Location: [facility];  Service: Endoscopy;;   CATARACT EXTRACTION Bilateral    COLONOSCOPY  01/2014   done in [place]. patient reports h/o  colon polyps on TCS in 2010, 2015 colonoscopy normal but advised to come back in 2020.    COLONOSCOPY WITH PROPOFOL N/A 01/19/2019   Procedure: COLONOSCOPY WITH PROPOFOL;  Surgeon: Daneil Dolin, MD;  [facility]: [facility];  Service: Endoscopy;  Laterality: N/A;  9:45am   COLONOSCOPY WITH PROPOFOL N/A 11/24/2021   pancolonic diverticulosis, three polyps, adenomas, negative microscopic colitis   IR FLUORO GUIDE CV LINE RIGHT  03/14/2022   IR PERC TUN PERIT CATH WO PORT S&I /IMAG  03/14/2022   IR REMOVAL TUN CV CATH W/O FL  03/19/2022   IR US GUIDE VASC ACCESS RIGHT  03/14/2022   KIDNEY TRANSPLANT Right 2010   POLYPECTOMY  01/19/2019   Procedure: POLYPECTOMY;  Surgeon: Daneil Dolin, MD;  [facility];  Service: Endoscopy;;   POLYPECTOMY  11/24/2021   Procedure: POLYPECTOMY;  Surgeon: Daneil Dolin, MD;  [facility]: [facility];  Service: Endoscopy;;    Family History  Problem Relation Age of Onset   Colon cancer Maternal Aunt 75       passed from [place] History   Marital status: Widowed    Spouse name: Not on file   Number of children: Not on file   Years of education: Not on file   Highest education level: Not on file  Occupational History   Not on file  Tobacco Use   Smoking  status: Never    Passive exposure: Never   Smokeless tobacco: Never  Vaping Use   Vaping Use: Never used  Substance and Sexual Activity   Alcohol use: Not Currently    Comment: As of 09/02/2018: Glass of wine 1-2 times a month   Drug use: No   Sexual activity: Yes    Birth control/protection: None  Other Topics Concern   Not on file  Social History Narrative   Not on file   Social Determinants of Health   Financial Resource Strain: Not on file  Food Insecurity: No Food Insecurity (03/09/2022)   Hunger Vital Sign    Worried About Running Out of Food in the Last Year: Never true    Ran Out of Food in the Last Year: Never true  Transportation  Needs: No Transportation Needs (03/09/2022)   PRAPARE - Hydrologist (Medical): No    Lack of Transportation (Non-Medical): No  Physical Activity: Not on file  Stress: Not on file  Social Connections: Not on file  Intimate Partner Violence: Not At Risk (03/09/2022)   Humiliation, Afraid, Rape, and Kick questionnaire    Fear of Current or Ex-Partner: No    Emotionally Abused: No    Physically Abused: No    Sexually Abused: No    No Known Allergies  Current Facility-Administered Medications  Medication Dose Route Frequency Provider Last Rate Last Admin   0.9 %  sodium chloride infusion   Intravenous Continuous Cherre Robins, MD   New Bag at 06/27/22 0715   ceFAZolin (ANCEF) IVPB 2g/100 mL premix  2 g Intravenous 30 min Pre-Op Cherre Robins, MD       chlorhexidine (HIBICLENS) 4 % liquid 4 Application  60 mL Topical Once Cherre Robins, MD       And   [START ON 06/28/2022] chlorhexidine (HIBICLENS) 4 % liquid 4 Application  60 mL Topical Once Cherre Robins, MD       insulin aspart (novoLOG) injection 0-7 Units  0-7 Units Subcutaneous Q2H PRN Annye Asa, MD       lactated ringers infusion   Intravenous Continuous Annye Asa, MD       Facility-Administered Medications Ordered in Other Encounters  Medication Dose Route Frequency Provider Last Rate Last Admin   fentaNYL citrate (PF) (SUBLIMAZE) injection   Intravenous Anesthesia Intra-op Janene Harvey, CRNA   25 mcg at 06/27/22 0745   lidocaine-EPINEPHrine 1.5 %-1:200000 injection   Peri-NEURAL Anesthesia Intra-op Annye Asa, MD   25 mL at 06/27/22 0746   midazolam (VERSED) injection   Intravenous Anesthesia Intra-op Janene Harvey, CRNA   1 mg at 06/27/22 0745    PHYSICAL EXAM Vitals:   06/25/22 1121 06/27/22 0645  BP:  (!) 145/83  Pulse:  80  Resp:  18  Temp:  (!) 97.4 F (36.3 C)  TempSrc:  Oral  SpO2:  97%  Weight: 90.3 kg 90.7 kg  Height: '5\' 10"'$  (1.778 m)  '5\' 10"'$  (1.778 m)   Chronically ill elderly man Regular rate and rhythm Unlabored breathing 2+ brachial pulses Visible r cephalic vein  PERTINENT LABORATORY AND RADIOLOGIC DATA  Most recent CBC    Latest Ref Rng & Units 06/14/2022    9:35 AM 06/14/2022    9:31 AM 05/17/2022   10:30 AM  CBC  WBC 4.0 - 10.5 K/uL  7.7  7.4   Hemoglobin 13.0 - 17.0 g/dL 11.3  11.7  11.2   Hematocrit 39.0 -  52.0 %  36.7  34.6   Platelets 150 - 400 K/uL  352  275      Most recent CMP    Latest Ref Rng & Units 06/14/2022    9:31 AM 05/17/2022   10:30 AM 03/27/2022    5:49 AM  CMP  Glucose 70 - 99 mg/dL 97  34  143   BUN 8 - 23 mg/dL 132  139  92   Creatinine 0.61 - 1.24 mg/dL 3.95  4.08  5.08   Sodium 135 - 145 mmol/L 138  142  138   Potassium 3.5 - 5.1 mmol/L 4.6  3.8  4.2   Chloride 98 - 111 mmol/L 104  106  101   CO2 22 - 32 mmol/L '24  23  26   '$ Calcium 8.9 - 10.3 mg/dL 8.6  8.9  8.3     Renal function Estimated Creatinine Clearance: 20.3 mL/min (A) (by C-G formula based on SCr of 3.95 mg/dL (H)).  Hgb A1c MFr Bld (%)  Date Value  02/22/2022 5.7 (H)    No results found for: "LDLCALC", "LDLC", "HIRISKLDL", "POCLDL", "LDLDIRECT", "REALLDLC", "TOTLDLC"    Azlin Zilberman N. Stanford Breed, MD FACS [facility] Phone Number: [phone] 06/27/2022 8:13 AM   Total time spent on preparing this encounter including chart review, data review, collecting history, examining the patient, coordinating care for this new patient, 60 minutes.  Portions of this report may have been transcribed using voice recognition software.  Every effort has been made to ensure accuracy; however, inadvertent computerized transcription errors may still be present.

## 2022-06-27 NOTE — Telephone Encounter (Signed)
Phoned and spoke with the pt advised that his insurance did provide a temporary supply of Xifaxan which he knew about. Also documentation from [facility] regarding this medication is not on the pt's formulary and quantity limit on this medication. Or in the future we can do a PA if this is working for the pt. Paperwork to be placed on your desk in the yellow folder

## 2022-06-27 NOTE — Anesthesia Postprocedure Evaluation (Signed)
Anesthesia Post Note  Patient: ESCAR KREMPASKY  Procedure(s) Performed: RIGHT BRACHIOCEPHALIC ARTERIOVENOUS (AV) FISTULA CREATION (Right: Arm Upper)     Patient location during evaluation: PACU Anesthesia Type: Regional Level of consciousness: awake and alert, patient cooperative and oriented Pain management: pain level not controlled Vital Signs Assessment: post-procedure vital signs reviewed and stable Respiratory status: spontaneous breathing, nonlabored ventilation and respiratory function stable Cardiovascular status: blood pressure returned to baseline and stable Postop Assessment: no apparent nausea or vomiting Anesthetic complications: no   No notable events documented.  Last Vitals:  Vitals:   06/27/22 0944 06/27/22 0945  BP: 123/64 108/62  Pulse: 71 71  Resp: 19 16  Temp: 36.9 C   SpO2: 96% 97%    Last Pain:  Vitals:   06/27/22 0944  TempSrc:   PainSc: 0-No pain                 Hunter Pinkard,E. Taryn Shellhammer

## 2022-06-27 NOTE — Transfer of Care (Signed)
Immediate Anesthesia Transfer of Care Note  Patient: Nathan Tyler  Procedure(s) Performed: RIGHT BRACHIOCEPHALIC ARTERIOVENOUS (AV) FISTULA CREATION (Right: Arm Upper)  Patient Location: PACU  Anesthesia Type:MAC and Regional  Level of Consciousness: awake, alert , and patient cooperative  Airway & Oxygen Therapy: Patient Spontanous Breathing  Post-op Assessment: Report given to RN and Post -op Vital signs reviewed and stable  Post vital signs: Reviewed and stable  Last Vitals:  Vitals Value Taken Time  BP 123/64 06/27/22 0941  Temp    Pulse 71 06/27/22 0943  Resp 19 06/27/22 0943  SpO2 96 % 06/27/22 0943  Vitals shown include unvalidated device data.  Last Pain:  Vitals:   06/27/22 0658  TempSrc:   PainSc: 0-No pain         Complications: No notable events documented.

## 2022-06-27 NOTE — Discharge Instructions (Signed)
[facility] of [facility]  Discharge Instructions  AV Fistula or Graft Surgery for Dialysis Access  Please refer to the following instructions for your post-procedure care. Your surgeon or physician assistant will discuss any changes with you.  Activity  You may drive the day following your surgery, if you are comfortable and no longer taking prescription pain medication. Resume full activity as the soreness in your incision resolves.  Bathing/Showering  You may shower after you go home. Keep your incision dry for 48 hours. Do not soak in a bathtub, hot tub, or swim until the incision heals completely. You may not shower if you have a hemodialysis catheter.  Incision Care  Clean your incision with mild soap and water after 48 hours. Pat the area dry with a clean towel. You do not need a bandage unless otherwise instructed. Do not apply any ointments or creams to your incision. You may have skin glue on your incision. Do not peel it off. It will come off on its own in about one week. Your arm may swell a bit after surgery. To reduce swelling use pillows to elevate your arm so it is above your heart. Your doctor will tell you if you need to lightly wrap your arm with an ACE bandage.  Diet  Resume your normal diet. There are not special food restrictions following this procedure. In order to heal from your surgery, it is CRITICAL to get adequate nutrition. Your body requires vitamins, minerals, and protein. Vegetables are the best source of vitamins and minerals. Vegetables also provide the perfect balance of protein. Processed food has little nutritional value, so try to avoid this.  Medications  Resume taking all of your medications. If your incision is causing pain, you may take over-the counter pain relievers such as acetaminophen (Tylenol). If you were prescribed a stronger pain medication, please be aware these medications can cause nausea and constipation. Prevent  nausea by taking the medication with a snack or meal. Avoid constipation by drinking plenty of fluids and eating foods with high amount of fiber, such as fruits, vegetables, and grains.  Do not take Tylenol if you are taking prescription pain medications.  Follow up Your surgeon may want to see you in the office following your access surgery. If so, this will be arranged at the time of your surgery.  Please call us immediately for any of the following conditions:  Increased pain, redness, drainage (pus) from your incision site Fever of 101 degrees or higher Severe or worsening pain at your incision site Hand pain or numbness.  Reduce your risk of vascular disease:  Stop smoking. If you would like help, call [facility] at 1-800-QUIT-NOW 772-304-4102) or [place] at [place] your cholesterol Maintain a desired weight Control your diabetes Keep your blood pressure down  Dialysis  It will take several weeks to several months for your new dialysis access to be ready for use. Your surgeon will determine when it is okay to use it. Your nephrologist will continue to direct your dialysis. You can continue to use your Permcath until your new access is ready for use.   06/27/2022 Nathan Tyler [id number] 23-Jul-1955  Surgeon(s): Cherre Robins, MD  Procedure(s): RIGHT BRACHIOCEPHALIC ARTERIOVENOUS (AV) FISTULA CREATION   May stick graft immediately   May stick graft on designated area only:   x Do not stick fistula for 12 weeks    If you have any questions, please call the office at [phone].

## 2022-06-28 ENCOUNTER — Encounter (HOSPITAL_COMMUNITY): Payer: Self-pay | Admitting: Vascular Surgery

## 2022-07-02 DIAGNOSIS — T8610 Unspecified complication of kidney transplant: Secondary | ICD-10-CM | POA: Diagnosis not present

## 2022-07-02 DIAGNOSIS — D84821 Immunodeficiency due to drugs: Secondary | ICD-10-CM | POA: Diagnosis not present

## 2022-07-02 DIAGNOSIS — Z794 Long term (current) use of insulin: Secondary | ICD-10-CM | POA: Diagnosis not present

## 2022-07-02 DIAGNOSIS — G255 Other chorea: Secondary | ICD-10-CM | POA: Diagnosis not present

## 2022-07-02 DIAGNOSIS — I5089 Other heart failure: Secondary | ICD-10-CM | POA: Diagnosis not present

## 2022-07-02 DIAGNOSIS — E1322 Other specified diabetes mellitus with diabetic chronic kidney disease: Secondary | ICD-10-CM | POA: Diagnosis not present

## 2022-07-02 DIAGNOSIS — N184 Chronic kidney disease, stage 4 (severe): Secondary | ICD-10-CM | POA: Diagnosis not present

## 2022-07-02 DIAGNOSIS — I708 Atherosclerosis of other arteries: Secondary | ICD-10-CM | POA: Diagnosis not present

## 2022-07-03 DIAGNOSIS — E1169 Type 2 diabetes mellitus with other specified complication: Secondary | ICD-10-CM | POA: Diagnosis not present

## 2022-07-03 DIAGNOSIS — E782 Mixed hyperlipidemia: Secondary | ICD-10-CM | POA: Diagnosis not present

## 2022-07-03 DIAGNOSIS — E039 Hypothyroidism, unspecified: Secondary | ICD-10-CM | POA: Diagnosis not present

## 2022-07-05 DIAGNOSIS — E1142 Type 2 diabetes mellitus with diabetic polyneuropathy: Secondary | ICD-10-CM | POA: Diagnosis not present

## 2022-07-05 DIAGNOSIS — B351 Tinea unguium: Secondary | ICD-10-CM | POA: Diagnosis not present

## 2022-07-06 DIAGNOSIS — E782 Mixed hyperlipidemia: Secondary | ICD-10-CM | POA: Diagnosis not present

## 2022-07-06 DIAGNOSIS — E1169 Type 2 diabetes mellitus with other specified complication: Secondary | ICD-10-CM | POA: Diagnosis not present

## 2022-07-06 DIAGNOSIS — I132 Hypertensive heart and chronic kidney disease with heart failure and with stage 5 chronic kidney disease, or end stage renal disease: Secondary | ICD-10-CM | POA: Diagnosis not present

## 2022-07-06 DIAGNOSIS — D509 Iron deficiency anemia, unspecified: Secondary | ICD-10-CM | POA: Diagnosis not present

## 2022-07-06 DIAGNOSIS — S62301D Unspecified fracture of second metacarpal bone, left hand, subsequent encounter for fracture with routine healing: Secondary | ICD-10-CM | POA: Diagnosis not present

## 2022-07-06 DIAGNOSIS — Z0001 Encounter for general adult medical examination with abnormal findings: Secondary | ICD-10-CM | POA: Diagnosis not present

## 2022-07-06 DIAGNOSIS — N185 Chronic kidney disease, stage 5: Secondary | ICD-10-CM | POA: Diagnosis not present

## 2022-07-06 DIAGNOSIS — I5033 Acute on chronic diastolic (congestive) heart failure: Secondary | ICD-10-CM | POA: Diagnosis not present

## 2022-07-06 DIAGNOSIS — J9601 Acute respiratory failure with hypoxia: Secondary | ICD-10-CM | POA: Diagnosis not present

## 2022-07-06 DIAGNOSIS — E1122 Type 2 diabetes mellitus with diabetic chronic kidney disease: Secondary | ICD-10-CM | POA: Diagnosis not present

## 2022-07-06 DIAGNOSIS — I10 Essential (primary) hypertension: Secondary | ICD-10-CM | POA: Diagnosis not present

## 2022-07-06 DIAGNOSIS — E669 Obesity, unspecified: Secondary | ICD-10-CM | POA: Diagnosis not present

## 2022-07-12 ENCOUNTER — Encounter (HOSPITAL_COMMUNITY): Payer: Medicare Other

## 2022-07-13 ENCOUNTER — Encounter (HOSPITAL_COMMUNITY): Payer: Medicare Other

## 2022-07-16 NOTE — Unmapped (Signed)
The [facility] has made a second and final attempt to reach this patient to refill the following medication: AUSTEDO 6MG       We have left voicemails on the following phone numbers: [phone] and have sent a text message to the following phone numbers: [phone] .    Dates contacted: 07/12/22 and  07/16/22  Last scheduled delivery: 06/17/22    The patient may be at risk of non-compliance with this medication. The patient should call the [facility] at [phone]  Option 4, then Option 2 (all other specialty patients) to refill medication.    Gary Rogers   [facility] Specialty Technician

## 2022-07-16 NOTE — Unmapped (Signed)
[facility] Refill Coordination Note    Specialty Medication(s) to be Shipped:   Neurology: Gary Rogers    Other medication(s) to be shipped: No additional medications requested for fill at this time     Gary Rogers, DOB: Jul 27, 1955  Phone: [phone] (home)       All above HIPAA information was verified with patient.     Was a Nurse, learning disability used for this call? No    Completed refill call assessment today to schedule patient's medication shipment from the [facility] [phone]).  All relevant notes have been reviewed.     Specialty medication(s) and dose(s) confirmed: Regimen is correct and unchanged.   Changes to medications: [name] reports no changes at this time.  Changes to insurance: No  New side effects reported not previously addressed with a pharmacist or physician: None reported  Questions for the pharmacist: No    Confirmed patient received a Conservation officer, historic buildings and a Surveyor, mining with first shipment. The patient will receive a drug information handout for each medication shipped and additional FDA Medication Guides as required.       DISEASE/MEDICATION-SPECIFIC INFORMATION        N/A    SPECIALTY MEDICATION ADHERENCE     Medication Adherence    Patient reported X missed doses in the last month: 0  Specialty Medication: Austedo 6mg   tab  Patient is on additional specialty medications: No              Were doses missed due to medication being on hold? No    Austedo 6 mg: 10 days of medicine on hand        REFERRAL TO PHARMACIST     Referral to the pharmacist: Not needed      [facility]     Shipping address confirmed in Epic.     Patient was notified of new phone menu : No    Delivery Scheduled: Yes, Expected medication delivery date: 07/20/22.     Medication will be delivered via UPS to the prescription address in Epic WAM.    Willette Pa   [facility] Specialty Technician

## 2022-07-17 DIAGNOSIS — I120 Hypertensive chronic kidney disease with stage 5 chronic kidney disease or end stage renal disease: Secondary | ICD-10-CM | POA: Diagnosis not present

## 2022-07-17 DIAGNOSIS — E875 Hyperkalemia: Secondary | ICD-10-CM | POA: Diagnosis not present

## 2022-07-17 DIAGNOSIS — Z79899 Other long term (current) drug therapy: Secondary | ICD-10-CM | POA: Diagnosis not present

## 2022-07-17 DIAGNOSIS — I770 Arteriovenous fistula, acquired: Secondary | ICD-10-CM | POA: Diagnosis not present

## 2022-07-17 DIAGNOSIS — E872 Acidosis, unspecified: Secondary | ICD-10-CM | POA: Diagnosis not present

## 2022-07-17 DIAGNOSIS — E1122 Type 2 diabetes mellitus with diabetic chronic kidney disease: Secondary | ICD-10-CM | POA: Diagnosis not present

## 2022-07-17 DIAGNOSIS — N179 Acute kidney failure, unspecified: Secondary | ICD-10-CM | POA: Diagnosis not present

## 2022-07-17 DIAGNOSIS — Z940 Kidney transplant status: Secondary | ICD-10-CM | POA: Diagnosis not present

## 2022-07-17 DIAGNOSIS — N185 Chronic kidney disease, stage 5: Secondary | ICD-10-CM | POA: Diagnosis not present

## 2022-07-17 DIAGNOSIS — D631 Anemia in chronic kidney disease: Secondary | ICD-10-CM | POA: Diagnosis not present

## 2022-07-18 DIAGNOSIS — G255 Other chorea: Principal | ICD-10-CM

## 2022-07-18 DIAGNOSIS — G2401 Drug induced subacute dyskinesia: Principal | ICD-10-CM

## 2022-07-19 MED FILL — AUSTEDO 6 MG TABLET: ORAL | 30 days supply | Qty: 60 | Fill #3

## 2022-07-23 ENCOUNTER — Ambulatory Visit (HOSPITAL_COMMUNITY)
Admission: RE | Admit: 2022-07-23 | Discharge: 2022-07-23 | Disposition: A | Discharge status: Home / Self Care | Payer: Medicare Other | Source: Ambulatory Visit | Attending: Nephrology | Admitting: Nephrology

## 2022-07-23 VITALS — BP 145/73 | HR 72 | Temp 97.5°F | Resp 18

## 2022-07-23 DIAGNOSIS — N183 Chronic kidney disease, stage 3 unspecified: Secondary | ICD-10-CM | POA: Insufficient documentation

## 2022-07-23 DIAGNOSIS — D631 Anemia in chronic kidney disease: Secondary | ICD-10-CM | POA: Diagnosis not present

## 2022-07-23 LAB — RENAL FUNCTION PANEL
Albumin: 3.5 g/dL (ref 3.5–5.0)
Anion gap: 12 (ref 5–15)
BUN: 156 mg/dL — ABNORMAL HIGH (ref 8–23)
CO2: 18 mmol/L — ABNORMAL LOW (ref 22–32)
Calcium: 8.4 mg/dL — ABNORMAL LOW (ref 8.9–10.3)
Chloride: 109 mmol/L (ref 98–111)
Creatinine, Ser: 4.21 mg/dL — ABNORMAL HIGH (ref 0.61–1.24)
GFR, Estimated: 15 mL/min — ABNORMAL LOW (ref >60)
Glucose, Bld: 81 mg/dL (ref 70–99)
Phosphorus: 6.9 mg/dL — ABNORMAL HIGH (ref 2.5–4.6)
Potassium: 3.9 mmol/L (ref 3.5–5.1)
Sodium: 139 mmol/L (ref 135–145)

## 2022-07-23 LAB — CBC WITH DIFFERENTIAL/PLATELET
Abs Immature Granulocytes: 0.08 10*3/uL — ABNORMAL HIGH (ref 0.00–0.07)
Basophils Absolute: 0 10*3/uL (ref 0.0–0.1)
Basophils Relative: 1 %
Eosinophils Absolute: 0.1 10*3/uL (ref 0.0–0.5)
Eosinophils Relative: 2 %
HCT: 35 % — ABNORMAL LOW (ref 39.0–52.0)
Hemoglobin: 10.8 g/dL — ABNORMAL LOW (ref 13.0–17.0)
Immature Granulocytes: 1 %
Lymphocytes Relative: 42 %
Lymphs Abs: 2.8 10*3/uL (ref 0.7–4.0)
MCH: 27.8 pg (ref 26.0–34.0)
MCHC: 30.9 g/dL (ref 30.0–36.0)
MCV: 90 fL (ref 80.0–100.0)
Monocytes Absolute: 0.8 10*3/uL (ref 0.1–1.0)
Monocytes Relative: 12 %
Neutro Abs: 2.8 10*3/uL (ref 1.7–7.7)
Neutrophils Relative %: 42 %
Platelets: 271 10*3/uL (ref 150–400)
RBC: 3.89 MIL/uL — ABNORMAL LOW (ref 4.22–5.81)
RDW: 14.7 % (ref 11.5–15.5)
WBC: 6.7 10*3/uL (ref 4.0–10.5)
nRBC: 0 % (ref 0.0–0.2)

## 2022-07-23 LAB — POCT HEMOGLOBIN-HEMACUE: Hemoglobin: 10.9 g/dL — ABNORMAL LOW (ref 13.0–17.0)

## 2022-07-23 MED ORDER — EPOETIN ALFA 40000 UNIT/ML IJ SOLN
40000.0000 [IU] | INTRAMUSCULAR | Status: DC
  Administered 2022-07-23: 40000 [IU] via SUBCUTANEOUS

## 2022-07-23 MED ORDER — EPOETIN ALFA 40000 UNIT/ML IJ SOLN
INTRAMUSCULAR | Status: AC
  Filled 2022-07-23: qty 1

## 2022-07-26 ENCOUNTER — Ambulatory Visit: Admit: 2022-07-26 | Discharge: 2022-07-27 | Payer: MEDICARE | Attending: Family | Primary: Family

## 2022-07-26 DIAGNOSIS — G255 Other chorea: Secondary | ICD-10-CM | POA: Diagnosis not present

## 2022-07-26 NOTE — Unmapped (Addendum)
Continue with Austedo as before.  Follow up 6-9 months.

## 2022-07-26 NOTE — Unmapped (Signed)
FINL [id number]  [facility] NEUROLOGY CLINIC [place] [address] [place]  [address]  [place] [address]  [phone]    Date: 07/26/2022  Patient Name: Gary Rogers  MRN: [id number]  PCP: Jack Quarto, MD    Assessment:      Gary Rogers is a 67 y.o. male who  has a past medical history of Anxiety, Chorea, Chronic kidney disease, Depression, Diabetes mellitus, type 2 (CMS-HCC), Hyperlipidemia, Hypertension, and Hyperthyroidism. presenting in follow up for evaluation of mostly generalized, asymmetric, adventitious movements sparing the upper face (left greater than right), historically beginning in a rather acute/subacute fashion in 2009 when he apparently had very poorly controlled blood sugars and development/appreciation of chronic kidney disease requiring renal transplant, with subsequent improvement of his chorea both subjectively and apparently objectively through follow-up clinic notes from the [facility] movement disorder group initially after performing renal transplant, subsequently with introduction of neuroleptics and most recently dopamine depletors, likely etiology of 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    1. 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 He develops parakinesias during examination, with a nonstereoptyped movement that flows from joint to joint, matching with suspected choreiform disorder. Some of his speech difficulties could be apraxic in nature versus ataxic in nature should he have had any involvement of his brainstem/cerebellum with the underlying toxic/metabolic process, but may just as well be related to lower facial chorea. I might argue the latter case after seeing his lower facial dyskinesias more vividly in the past off of therapy.  Patient historically has benefited from tetrabenazine with more evident EPS symptoms that were even bothering the patient for which reason we discontinued therapy.  Deutetrabenazine has been well-tolerated with what appear to be minimal EPS symptoms which are not bothering the patient, and overall feels as though his choreiform movement is well controlled.    Since last visit patient continues to manage chronic health issues and is awaiting approval for potential kidney transplant. His TSH is being managed by primary care and they have increased with levothyroxine with some benefit felt.  We will continue current dose of Austedo unchanged as he feels there is not currently a need to adjust medications as his choreiform movement is well controlled. I assured patient that Austedo dose does not need to be altered in case of kidney transplant or renal failure.    I personally spent 45 minutes face-to-face and non-face-to-face in the care of this patient, which includes all pre, intra, and post visit time on the date of service.  All documented time was specific to the E/M visit and does not include any procedures that may have been performed.    Follow-up 6-9 months.    Scribe's Attestation: Cathlean Cower, NP obtained and performed the history, physical exam and medical decision making elements that were entered into the chart. Signed by Cyril Loosen, Scribe, on July 26, 2022 at 8:40 AM.    ----------------------------------------------------------------------------------------------------------------------  July 26, 2022 12:22 PM. Documentation assistance provided by the Scribe. I was present during the time the encounter was recorded.  The information recorded by the Scribe was done at my direction and has been reviewed and validated by me.    Gary Ruder, FNP  ----------------------------------------------------------------------------------------------------------------------       Plan:      Patient Instructions   Continue with [name] as before.  Follow up 6-9 months.       Subjective:      HPI: Patient is a 67 y.o. left-handed Caucasian male with past medical history of chronic kidney disease status post renal transplant in 2010, diabetes mellitus previously quite poorly controlled, who presents for follow up of adventitious movements that have been present throughout the body since approximately 2009. These were felt to be toxic-metabolic induced choreaform movements. Please review my initial clinic visit note dated on 09/29.2016 for more information regarding history of present illness.    Interim history: Patient presents today by himself. He is waiting to undergo a kidney transplant. He has been evaluated but is waiting for the committee to meet and approve to put him on the list to match him with a donor and set up the transplant. He also had a kidney transplant in 2010.     He thinks his chorea is doing pretty good and is not interfering with quality of life. He has had a couple of falls in the last year but nothing recent. He has a diabetic pump for insulin and his blood sugar is under control. His thyroid and blood pressure are under control. He has lost weight intentionally, via eating healthier and exercising some.    Dose of Austedo has not changed (1 6mg  tablet twice a day).    His balance has gotten better since last visit. He did physical therapy which wasn't great but was okay.    Mood is okay. Patient denies hallucinations.    He is having more frequent urinations (2-3 times a night).    CHOREA/DYSKINESIA REGIMEN:  Austedo 6 mg 1 tablet twice a day    On review of systems, the patient denies hallucinations, impulsivity, irritability, current memory deficits.  Denies double vision. Denies loss of consciousness episodes. Denies urinary urgency or frequency/hesitancy of urination.    Past Medical Hx:    Past Medical History:   Diagnosis Date    Anxiety     Chorea     Chronic kidney disease     Depression     Diabetes mellitus, type 2 (CMS-HCC)     Hyperlipidemia     Hypertension     Hyperthyroidism        Past Surgical Hx:    Past Surgical History:   Procedure Laterality Date    NEPHRECTOMY TRANSPLANTED ORGAN Bilateral 2010       Social Hx:    Social History     Socioeconomic History    Marital status: Widowed     Spouse name: None    Number of children: None    Years of education: None    Highest education level: None   Tobacco Use    Smoking status: Never     Passive exposure: Never    Smokeless tobacco: Never   Substance and Sexual Activity    Alcohol use: Not Currently    Drug use: No    Sexual activity: Not Currently   Social History Narrative    1.  Retired Naval architect    2.  Lives alone, but nearby his mother in [place].    3.  Patient is a widower    4.  Denies any known exposure to toxic substances such as industrial pesticides or radiation.     Social Determinants of Health     Food Insecurity: No Food Insecurity (03/09/2022)    Received from [facility]    Hunger Vital Sign     Worried About Running Out of Food in the Last Year: Never true     Ran Out of Food in the Last Year: Never true   Transportation Needs: No Transportation Needs (03/09/2022)    Received from [facility] - Transportation     Lack of Transportation (Medical): No     Lack of Transportation (Non-Medical): No    Received from [facility]    Social Network       Family Hx:    Family History   Problem Relation Age of Onset    Thyroid disease Mother     Brain cancer Father     Hearing loss Father     No Known Problems Sister     Diabetes Maternal Grandfather     No Known Problems Sister Hepatitis Son         Hepatitis C    No Known Problems Daughter        ALLERGIES:  No Known Allergies    CURRENT MEDICATIONS:    Current Outpatient Medications   Medication Sig Dispense Refill    amlodipine (NORVASC) 10 MG tablet Take 1 tablet (10 mg total) by mouth daily.      atorvastatin (LIPITOR) 10 MG tablet Take 1 tablet (10 mg total) by mouth in the morning.      CAPTOPRIL ORAL Take 6.25 mg by mouth two (2) times a day.      carvedilol (COREG) 6.25 MG tablet Take 1 tablet (6.25 mg total) by mouth two (2) times a day.      deutetrabenazine (AUSTEDO) 6 mg Tab Take 1 tablet (6 mg total) by mouth two (2) times a day. 60 tablet 6    DEXCOM G7 SENSOR Devi Use as directed.      hydrALAZINE (APRESOLINE) 25 MG tablet       insulin degludec 200 unit/mL (3 mL) InPn Inject under the skin daily.       levothyroxine (SYNTHROID) 125 MCG tablet Take 1 tablet (125 mcg total) by mouth daily.      LOKELMA 10 gram PwPk packet 1 (ONE) PACKET BY MOUTH THREE TIMES A WEEK      mycophenolate (MYFORTIC) 180 MG EC tablet Take 1 tablet (180 mg total) by mouth two (2) times a day.      NOVOLOG U-100 INSULIN ASPART 100 unit/mL vial INJECT UP TO 200 UNITS INTO OMNIPOD INSULIN PUMP EVERY 3 DAYS.      OMNIPOD DASH PODS, GEN 4, Crtg CHANGE POD EVERY 72 HOURS      predniSONE (DELTASONE) 5 MG tablet Take 1 tablet (5 mg total) by mouth daily.      sodium bicarbonate 650 mg tablet Take 1 tablet (650 mg total) by mouth two (2) times a day.      tacrolimus (PROGRAF) 0.5 MG capsule Take 3 capsules (1.5 mg total) by mouth two (2) times a day. TAKE 4 CAPSULES BY MOUTH TWICE DAILY      glucagon (GVOKE HYPOPEN 1-PACK) 1 mg/0.2 mL AtIn Keep on hand      mycophenolate (CELLCEPT) 500 mg tablet Take by mouth Two (2) times a day. (Patient not taking: Reported on 07/26/2022)       No  current facility-administered medications for this visit.        Objective:      Physical Exam:  Vitals:    07/26/22 0758 07/26/22 0800   Orthostatic BP: 149/65 123/52 Orthostatic Pulse: 67 69   BP Site: L Arm L Arm   BP Position: Sitting Standing   BP Cuff Size: Medium Medium        Neurological Examination:   MS: AAOx3, naming/fluency/repetition intact. Following lateralizing commands across midline     Cranial nerves: PERRL, approx. 4.5 to 3.76mm brisk bilaterally. EOMI except for inability to bury sclera on bilateral, lateral gaze, (abducting eye only (even with monocular testing)). No end gaze horizontal nystagmus appreciated, no interrupting saccades.  No evidence today for hypometric nor hypermetric saccades.  VFF to confrontation. No facial asymmetry. Tongue protrudes midline.  V1-3 intact to light touch and pinprick. SCM and trapezius 5/5 bilaterally.    Motor: symmetric bulk throughout. No fasciculations.       MOVEMENT DISORDERS EXAMINATION:  Facial expression was normal . Volume of speech was normal . Handwriting was not assessed. There was no evidence of rest tremor. There was no evidence of postural tremor. There was mild  action tremor in bil upper extremities. Tone was normal in the neck and tone was normal in the right upper extremity, right lower extremity and left lower extremity, but there was increased tone in the left upper extremity today (influenced by paratonias).  Rapid alternating movements were mildly decreased in amplitude and speed with mild breakdown in the left upper extremity and left lower extremity.   He could get up from a low lying chair without help of both hands..  Gait was steady with good heel to toe stride without alternating length of stride.  Arm swing was normal bilaterally with moderate parakinesias.     In addition, there were evident choreiform movements of the upper extremities, right upper extremity mildly worse than left upper extremity again, not present in the trunk; present in lower limbs only while seated today, not while ambulating (right worse than left). Moderate orobuccolingual movements appreciated without upper facial involvement.  Please see AIMS score below mostly for monitoring purposes.    Coordination: slight overshoot with finger chase.     Testing:      AIMS TARDIVE DYSKINESIA SCALE    Facial and Oral Movements  1. Muscles of facial expression (e.g. movements of forehead, eyebrows, periorbital area, cheeks. Include frowning, blinking, grimacing of upper face)   2 = mild    2. Lips and perioral area (e.g. puckering, pouting, smacking)  3 = moderate    3. Jaw (biting, clenching, chewing, mouth opening, lateral movement)  2 = mild    4. Tongue (rate only increase in movement both in and out of mouth, not inability to sustain movement)  1 = minimal (may be extreme normal)    Extremity Movements   5. Upper (arms, wrists, hands, fingers; include movements that are choreic or athetoid.  Do not include tremor)            2 = mild     6. Lower (legs, knees, ankles, toes; e.g. lateral knee movement, foot tapping, heel dropping, foot squirming, inversion and eversion of foot)            3 = moderate    Trunk Movements   7.  Neck, shoulders, hips (rocking, twisting, squirming, pelvic gyrations.  Include diaphragmatic movements)   2 = mild    Global Judgements  8.  Severity of abnormal movements          2 = mild    Based on the highest single score on the above items  9.  Incapacitation due to abnormal movements  1 = minimal    10.  Patient's awareness of abnormal movements  1 = aware, no distress    11.  Current problems with teeth and/or dentures  0 = no    12.  Does patient usually wear dentures?  0 = no  ______________________________________________________________________  TOTAL: 19

## 2022-08-03 ENCOUNTER — Other Ambulatory Visit: Payer: Self-pay | Admitting: *Deleted

## 2022-08-03 DIAGNOSIS — N185 Chronic kidney disease, stage 5: Secondary | ICD-10-CM

## 2022-08-03 NOTE — Unmapped (Signed)
[facility] Clinical Assessment & Refill Coordination Note    Gary Rogers, [place]: [birth year]  Phone: [phone] (home)     All above HIPAA information was verified with patient.     Was a Nurse, learning disability used for this call? No    Specialty Medication(s):   Neurology: Gary Rogers     Current Outpatient Medications   Medication Sig Dispense Refill    amlodipine (NORVASC) 10 MG tablet Take 1 tablet (10 mg total) by mouth daily.      atorvastatin (LIPITOR) 10 MG tablet Take 1 tablet (10 mg total) by mouth in the morning.      CAPTOPRIL ORAL Take 6.25 mg by mouth two (2) times a day.      carvedilol (COREG) 6.25 MG tablet Take 1 tablet (6.25 mg total) by mouth two (2) times a day.      deutetrabenazine (AUSTEDO) 6 mg Tab Take 1 tablet (6 mg total) by mouth two (2) times a day. 60 tablet 6    DEXCOM G7 SENSOR Devi Use as directed.      glucagon (GVOKE HYPOPEN 1-PACK) 1 mg/0.2 mL AtIn Keep on hand      hydrALAZINE (APRESOLINE) 25 MG tablet       insulin degludec 200 unit/mL (3 mL) InPn Inject under the skin daily.       levothyroxine (SYNTHROID) 125 MCG tablet Take 1 tablet (125 mcg total) by mouth daily.      LOKELMA 10 gram PwPk packet 1 (ONE) PACKET BY MOUTH THREE TIMES A WEEK      mycophenolate (CELLCEPT) 500 mg tablet Take by mouth Two (2) times a day. (Patient not taking: Reported on 07/26/2022)      mycophenolate (MYFORTIC) 180 MG EC tablet Take 1 tablet (180 mg total) by mouth two (2) times a day.      NOVOLOG U-100 INSULIN ASPART 100 unit/mL vial INJECT UP TO 200 UNITS INTO OMNIPOD INSULIN PUMP EVERY 3 DAYS.      OMNIPOD DASH PODS, GEN 4, Crtg CHANGE POD EVERY 72 HOURS      predniSONE (DELTASONE) 5 MG tablet Take 1 tablet (5 mg total) by mouth daily.      sodium bicarbonate 650 mg tablet Take 1 tablet (650 mg total) by mouth two (2) times a day.      tacrolimus (PROGRAF) 0.5 MG capsule Take 3 capsules (1.5 mg total) by mouth two (2) times a day. TAKE 4 CAPSULES BY MOUTH TWICE DAILY No current facility-administered medications for this visit.        Changes to medications: Gary Rogers reports no changes at this time.    No Known Allergies    Changes to allergies: No    SPECIALTY MEDICATION ADHERENCE     Austedo 6 mg: 21 days of medicine on hand            Specialty medication(s) dose(s) confirmed: Regimen is correct and unchanged.     Are there any concerns with adherence? No    Adherence counseling provided? Not needed    CLINICAL MANAGEMENT AND INTERVENTION      Clinical Benefit Assessment:    Do you feel the medicine is effective or helping your condition? Yes    Clinical Benefit counseling provided? Not needed    Adverse Effects Assessment:    Are you experiencing any side effects? No    Are you experiencing difficulty administering your medicine? No    Quality of Life Assessment:    Quality of  Life    Rheumatology  Oncology  Dermatology  Cystic Fibrosis          How many days over the past month did your Tardive Dyskines  keep you from your normal activities? For example, brushing your teeth or getting up in the morning. 0    Have you discussed this with your provider? Not needed    Acute Infection Status:    Acute infections noted within Epic:  No active infections  Patient reported infection: None    Therapy Appropriateness:    Is therapy appropriate and patient progressing towards therapeutic goals? Yes, therapy is appropriate and should be continued    DISEASE/MEDICATION-SPECIFIC INFORMATION      N/A    Other Neurological Condtions: Not Applicable    PATIENT SPECIFIC NEEDS     Does the patient have any physical, cognitive, or cultural barriers? No    Is the patient high risk? No    Did the patient require a clinical intervention? No    Does the patient require physician intervention or other additional services (i.e., nutrition, smoking cessation, social work)? No    SOCIAL DETERMINANTS OF HEALTH     At the [facility], we have learned that life circumstances - like trouble affording food, housing, utilities, or transportation can affect the health of many of our patients.   That is why we wanted to ask: are you currently experiencing any life circumstances that are negatively impacting your health and/or quality of life? Patient declined to answer    Social Determinants of Health     Financial Resource Strain: Not on file   Internet Connectivity: Not on file   Food Insecurity: No Food Insecurity (03/09/2022)    Received from [facility]    Hunger Vital Sign     Worried About Running Out of Food in the Last Year: Never true     Ran Out of Food in the Last Year: Never true   Tobacco Use: Low Risk  (07/26/2022)    Patient History     Smoking Tobacco Use: Never     Smokeless Tobacco Use: Never     Passive Exposure: Never   Housing/Utilities: Unknown (08/01/2020)    Housing/Utilities     Within the past 12 months, have you ever stayed: outside, in a car, in a tent, in an overnight shelter, or temporarily in someone else's home (i.e. couch-surfing)?: No     Are you worried about losing your housing?: Not on file     Within the past 12 months, have you been unable to get utilities (heat, electricity) when it was really needed?: Not on file   Alcohol Use: Not At Risk (06/01/2022)    Received from [facility]    AUDIT-C     Frequency of Alcohol Consumption: Never     Average Number of Drinks: Patient does not drink     Frequency of Binge Drinking: Never   Transportation Needs: No Transportation Needs (03/09/2022)    Received from [facility] - Transportation     Lack of Transportation (Medical): No     Lack of Transportation (Non-Medical): No   Substance Use: Not on file   Health Literacy: Not on file   Physical Activity: Not on file   Interpersonal Safety: Not on file   Stress: Not on file   Intimate Partner Violence: Not At Risk (03/09/2022)    Received from [facility]    Humiliation, Afraid, Rape,  and Kick questionnaire     Fear of Current or Ex-Partner: No     Emotionally Abused: No     Physically Abused: No     Sexually Abused: No   Depression: Not at risk (06/28/2022)    Received from [facility]    PHQ-2     Patient Health Questionnaire-2 Score: 0   Social Connections: Unknown (11/03/2021)    Received from [facility]    Social Network     Social Network: Not on file       Would you be willing to receive help with any of the needs that you have identified today? Not applicable       SHIPPING     Specialty Medication(s) to be Shipped:   Neurology: Gary Rogers    Other medication(s) to be shipped: No additional medications requested for fill at this time     Changes to insurance: No    Delivery Scheduled: Yes, Expected medication delivery date: 08/17/22.     Medication will be delivered via UPS to the confirmed prescription address in [facility].    The patient will receive a drug information handout for each medication shipped and additional FDA Medication Guides as required.  Verified that patient has previously received a Conservation officer, historic buildings and a Surveyor, mining.    The patient or caregiver noted above participated in the development of this care plan and knows that they can request review of or adjustments to the care plan at any time.      All of the patient's questions and concerns have been addressed.    [name], PharmD   [facility] Specialty Pharmacist

## 2022-08-10 ENCOUNTER — Ambulatory Visit (HOSPITAL_COMMUNITY)
Admission: RE | Admit: 2022-08-10 | Discharge: 2022-08-10 | Disposition: A | Discharge status: Home / Self Care | Payer: Medicare Other | Source: Ambulatory Visit | Attending: Nephrology | Admitting: Nephrology

## 2022-08-10 VITALS — BP 148/64 | HR 65 | Temp 97.8°F | Resp 20

## 2022-08-10 DIAGNOSIS — N183 Chronic kidney disease, stage 3 unspecified: Secondary | ICD-10-CM | POA: Insufficient documentation

## 2022-08-10 LAB — POCT HEMOGLOBIN-HEMACUE: Hemoglobin: 11.5 g/dL — ABNORMAL LOW (ref 13.0–17.0)

## 2022-08-10 MED ORDER — EPOETIN ALFA 40000 UNIT/ML IJ SOLN: 40000.0000 [IU] | INTRAMUSCULAR | Status: DC

## 2022-08-14 ENCOUNTER — Ambulatory Visit (HOSPITAL_COMMUNITY)
Admission: RE | Admit: 2022-08-14 | Discharge: 2022-08-14 | Disposition: A | Discharge status: Home / Self Care | Payer: Medicare Other | Source: Ambulatory Visit | Attending: Vascular Surgery | Admitting: Vascular Surgery

## 2022-08-14 ENCOUNTER — Ambulatory Visit (INDEPENDENT_AMBULATORY_CARE_PROVIDER_SITE_OTHER): Payer: Medicare Other | Admitting: Physician Assistant

## 2022-08-14 VITALS — BP 142/73 | HR 65 | Temp 97.8°F | Resp 22 | Ht 71.0 in | Wt 202.6 lb

## 2022-08-14 DIAGNOSIS — N189 Chronic kidney disease, unspecified: Secondary | ICD-10-CM

## 2022-08-14 DIAGNOSIS — N185 Chronic kidney disease, stage 5: Secondary | ICD-10-CM | POA: Diagnosis not present

## 2022-08-14 NOTE — Progress Notes (Signed)
POST OPERATIVE OFFICE NOTE    CC:  F/u for surgery  HPI:  This is a 67 y.o. male who is s/p Right brachiocephalic arteriovenous fistula creation on 06/27/22 by Dr. Lenell Antu.  He is currently not on HD.  He states he is on the transplant list for a kidney.    Pt returns today for follow up.  Pt states he has no symptoms of steal such as pain, loss of motor or loss of sensation in the right UE.     No Known Allergies  Current Outpatient Medications  Medication Sig Dispense Refill   amLODipine (NORVASC) 10 MG tablet Take 10 mg by mouth daily.     atorvastatin (LIPITOR) 10 MG tablet Take 10 mg by mouth daily.     carvedilol (COREG) 6.25 MG tablet Take 6.25 mg by mouth 2 (two) times daily with a meal.     Deutetrabenazine (AUSTEDO) 6 MG TABS Take 6 mg by mouth 2 (two) times daily.     furosemide (LASIX) 80 MG tablet Take 1 tablet (80 mg total) by mouth 2 (two) times daily. (Patient taking differently: Take 40-80 mg by mouth See admin instructions. Take 80 mg in the morning and 40 mg at night) 60 tablet 0   GVOKE HYPOPEN 2-PACK 1 MG/0.2ML SOAJ Inject 1 Dose as directed as needed (SUGARS SEVERELY LOW LESS THAN 55 NOT RESPONDING TO SUGAR INTAKE).     hydrALAZINE (APRESOLINE) 50 MG tablet Take 50 mg by mouth 3 (three) times daily.     insulin aspart (NOVOLOG) 100 UNIT/ML injection Inject into the skin continuous. Uses in omnipod insulin pump     Insulin Disposable Pump (OMNIPOD DASH PODS, GEN 4,) MISC      levothyroxine (SYNTHROID) 125 MCG tablet Take 125 mcg by mouth daily.     mycophenolate (MYFORTIC) 180 MG EC tablet Take 1 tablet (180 mg total) by mouth 2 (two) times daily.     OVER THE COUNTER MEDICATION Dexacon G 7 glucose monitor     predniSONE (DELTASONE) 5 MG tablet Take 1 tablet (5 mg total) by mouth daily with breakfast. restart  dose 3/10 after completing prednisone taper (2/24 - 3/9) (Patient taking differently: Take 5 mg by mouth daily with breakfast.)     QUEtiapine (SEROQUEL) 25  MG tablet Take 1 tablet (25 mg total) by mouth at bedtime for 5 days. (Patient not taking: Reported on 06/25/2022) 5 tablet 0   sodium bicarbonate 650 MG tablet Take 650 mg by mouth 2 (two) times daily.     tacrolimus (PROGRAF) 0.5 MG capsule Take 1.5 mg by mouth 2 (two) times daily.      traMADol (ULTRAM) 50 MG tablet Take 1 tablet (50 mg total) by mouth every 6 (six) hours as needed. 12 tablet 0   No current facility-administered medications for this visit.     ROS:  See HPI  Physical Exam:   Findings:  +--------------------+----------+-----------------+--------+  AVF                PSV (cm/s)Flow Vol (mL/min)Comments  +--------------------+----------+-----------------+--------+  Native artery inflow   294          1743                 +--------------------+----------+-----------------+--------+  AVF Anastomosis        787                               +--------------------+----------+-----------------+--------+     +------------+----------+-------------+-----------+------------------------  ----+  OUTFLOW VEINPSV (cm/s)Diameter (cm)Depth (cm)           Describe             +------------+----------+-------------+-----------+------------------------  ----+  Shoulder      125        0.69        1.19                                   +------------+----------+-------------+-----------+------------------------  ----+  Prox UA        172        0.66        44.00                                  +------------+----------+-------------+-----------+------------------------  ----+  Mid UA         185        0.60        0.77     competing branch and  0.20                                                          cm, 42 cm/s            +------------+----------+-------------+-----------+------------------------  ----+  Dist UA     181 / 151  0.53 / 0.69 0.48 / 0.28  competing branch and  49                                                           cm/s, 0.15 cm           +------------+----------+-------------+-----------+------------------------  ----+  AC Fossa       252        0.86        0.19                                   +------------+----------+-------------+-----------+------------------------  ----+        Summary:  Patent right Brachial Cephalic AVF with increased velocity at tortuosity  at anastomosis site.   Incision:  well healed Extremities:  palpable radial pulse and thrill in the fistula distally Neuro: sensation intact equal B UE Lungs non labored breathing      Assessment/Plan:  This is a 67 y.o. male who is s/p: First stage basilic av fistula   He is currently not on HD, and he is not on anticoagulation.  The fistula diameter has matured well and is > 0.6 cm, however the fistula is too deep to access.  He will be scheduled for second stage basilic AV fistula.   He agrees with this plan.     Mosetta Pigeon, [facility] [phone]   Clinic MD:  Lenell Antu

## 2022-08-14 NOTE — H&P (View-Only) (Signed)
POST OPERATIVE OFFICE NOTE    CC:  F/u for surgery  HPI:  This is a 67 y.o. male who is s/p Right brachiocephalic arteriovenous fistula creation on 06/27/22 by Dr. Hawken.  He is currently not on HD.  He states he is on the transplant list for a kidney.    Pt returns today for follow up.  Pt states he has no symptoms of steal such as pain, loss of motor or loss of sensation in the right UE.     No Known Allergies  Current Outpatient Medications  Medication Sig Dispense Refill   amLODipine (NORVASC) 10 MG tablet Take 10 mg by mouth daily.     atorvastatin (LIPITOR) 10 MG tablet Take 10 mg by mouth daily.     carvedilol (COREG) 6.25 MG tablet Take 6.25 mg by mouth 2 (two) times daily with a meal.     Deutetrabenazine (AUSTEDO) 6 MG TABS Take 6 mg by mouth 2 (two) times daily.     furosemide (LASIX) 80 MG tablet Take 1 tablet (80 mg total) by mouth 2 (two) times daily. (Patient taking differently: Take 40-80 mg by mouth See admin instructions. Take 80 mg in the morning and 40 mg at night) 60 tablet 0   GVOKE HYPOPEN 2-PACK 1 MG/0.2ML SOAJ Inject 1 Dose as directed as needed (SUGARS SEVERELY LOW LESS THAN 55 NOT RESPONDING TO SUGAR INTAKE).     hydrALAZINE (APRESOLINE) 50 MG tablet Take 50 mg by mouth 3 (three) times daily.     insulin aspart (NOVOLOG) 100 UNIT/ML injection Inject into the skin continuous. Uses in omnipod insulin pump     Insulin Disposable Pump (OMNIPOD DASH PODS, GEN 4,) MISC      levothyroxine (SYNTHROID) 125 MCG tablet Take 125 mcg by mouth daily.     mycophenolate (MYFORTIC) 180 MG EC tablet Take 1 tablet (180 mg total) by mouth 2 (two) times daily.     OVER THE COUNTER MEDICATION Dexacon G 7 glucose monitor     predniSONE (DELTASONE) 5 MG tablet Take 1 tablet (5 mg total) by mouth daily with breakfast. restart 5mg dose 3/10 after completing prednisone taper (2/24 - 3/9) (Patient taking differently: Take 5 mg by mouth daily with breakfast.)     QUEtiapine (SEROQUEL) 25  MG tablet Take 1 tablet (25 mg total) by mouth at bedtime for 5 days. (Patient not taking: Reported on 06/25/2022) 5 tablet 0   sodium bicarbonate 650 MG tablet Take 650 mg by mouth 2 (two) times daily.     tacrolimus (PROGRAF) 0.5 MG capsule Take 1.5 mg by mouth 2 (two) times daily.      traMADol (ULTRAM) 50 MG tablet Take 1 tablet (50 mg total) by mouth every 6 (six) hours as needed. 12 tablet 0   No current facility-administered medications for this visit.     ROS:  See HPI  Physical Exam:   Findings:  +--------------------+----------+-----------------+--------+  AVF                PSV (cm/s)Flow Vol (mL/min)Comments  +--------------------+----------+-----------------+--------+  Native artery inflow   294          1743                 +--------------------+----------+-----------------+--------+  AVF Anastomosis        787                               +--------------------+----------+-----------------+--------+     +------------+----------+-------------+-----------+------------------------  ----+    OUTFLOW VEINPSV (cm/s)Diameter (cm)Depth (cm)           Describe             +------------+----------+-------------+-----------+------------------------  ----+  Shoulder      125        0.69        1.19                                   +------------+----------+-------------+-----------+------------------------  ----+  Prox UA        172        0.66        44.00                                  +------------+----------+-------------+-----------+------------------------  ----+  Mid UA         185        0.60        0.77     competing branch and  0.20                                                          cm, 42 cm/s            +------------+----------+-------------+-----------+------------------------  ----+  Dist UA     181 / 151  0.53 / 0.69 0.48 / 0.28  competing branch and  49                                                           cm/s, 0.15 cm           +------------+----------+-------------+-----------+------------------------  ----+  AC Fossa       252        0.86        0.19                                   +------------+----------+-------------+-----------+------------------------  ----+        Summary:  Patent right Brachial Cephalic AVF with increased velocity at tortuosity  at anastomosis site.   Incision:  well healed Extremities:  palpable radial pulse and thrill in the fistula distally Neuro: sensation intact equal B UE Lungs non labored breathing      Assessment/Plan:  This is a 67 y.o. male who is s/p: First stage basilic av fistula   He is currently not on HD, and he is not on anticoagulation.  The fistula diameter has matured well and is > 0.6 cm, however the fistula is too deep to access.  He will be scheduled for second stage basilic AV fistula.   He agrees with this plan.     Anikin Prosser Maureen Nakiyah Beverley, PAC [facility] [phone]   Clinic MD:  Hawken 

## 2022-08-16 ENCOUNTER — Telehealth: Payer: Self-pay

## 2022-08-16 ENCOUNTER — Other Ambulatory Visit: Payer: Self-pay

## 2022-08-16 DIAGNOSIS — N189 Chronic kidney disease, unspecified: Secondary | ICD-10-CM

## 2022-08-16 MED FILL — AUSTEDO 6 MG TABLET: ORAL | 30 days supply | Qty: 60 | Fill #4

## 2022-08-16 NOTE — Telephone Encounter (Signed)
Patient returned call requesting to schedule surgery after 5/15 if possible. Surgery scheduled for 5/20 and instructions provided. He verbalized understanding.

## 2022-08-16 NOTE — Telephone Encounter (Signed)
Called pt to schedule his surgery. I have left him a voicemail asking him to return our call.

## 2022-08-20 ENCOUNTER — Encounter (HOSPITAL_COMMUNITY): Payer: Medicare Other

## 2022-08-20 NOTE — Progress Notes (Deleted)
GI Office Note    Referring Provider: Benita Stabile, MD Primary Care Physician:  Benita Stabile, MD  Primary Gastroenterologist: Roetta Sessions, MD   Chief Complaint   No chief complaint on file.   History of Present Illness   Nathan Tyler is a 67 y.o. male presenting today for follow-up.  Last seen in January 2024.  History of chronic diarrhea, chronic hepatitis C status posttreatment and documented SVR, F2 historically without need for ongoing surveillance.  History of renal transplant.  Colonoscopy 2023 with pancolonic diverticulosis, 3 adenomas removed, negative for microscopic colitis.  No improvement in diarrhea with Creon.  Consider Xifaxan.  Celiac serologies were normal.  Acute on chronic anemia requiring packed red blood cells.  Felt to be related to chronic kidney disease and IDA component.  He wanted to hold off on EGD due to wife's illness.  Last hemoglobin April 19 11.5.   Medications   Current Outpatient Medications  Medication Sig Dispense Refill   amLODipine (NORVASC) 10 MG tablet Take 10 mg by mouth daily.     atorvastatin (LIPITOR) 10 MG tablet Take 10 mg by mouth daily.     carvedilol (COREG) 6.25 MG tablet Take 6.25 mg by mouth 2 (two) times daily with a meal.     Deutetrabenazine (AUSTEDO) 6 MG TABS Take 6 mg by mouth 2 (two) times daily.     furosemide (LASIX) 80 MG tablet Take 1 tablet (80 mg total) by mouth 2 (two) times daily. (Patient taking differently: Take 40-80 mg by mouth See admin instructions. Take 80 mg in the morning and 40 mg at night) 60 tablet 0   GVOKE HYPOPEN 2-PACK 1 MG/0.2ML SOAJ Inject 1 Dose as directed as needed (SUGARS SEVERELY LOW LESS THAN 55 NOT RESPONDING TO SUGAR INTAKE).     hydrALAZINE (APRESOLINE) 50 MG tablet Take 50 mg by mouth 3 (three) times daily.     insulin aspart (NOVOLOG) 100 UNIT/ML injection Inject into the skin continuous. Uses in omnipod insulin pump     Insulin Disposable Pump (OMNIPOD DASH PODS, GEN 4,) MISC       levothyroxine (SYNTHROID) 125 MCG tablet Take 125 mcg by mouth daily.     mycophenolate (MYFORTIC) 180 MG EC tablet Take 1 tablet (180 mg total) by mouth 2 (two) times daily.     OVER THE COUNTER MEDICATION Dexacon G 7 glucose monitor     predniSONE (DELTASONE) 5 MG tablet Take 1 tablet (5 mg total) by mouth daily with breakfast. restart 5mg  dose 3/10 after completing prednisone taper (2/24 - 3/9) (Patient taking differently: Take 5 mg by mouth daily with breakfast.)     QUEtiapine (SEROQUEL) 25 MG tablet Take 1 tablet (25 mg total) by mouth at bedtime for 5 days. (Patient not taking: Reported on 06/25/2022) 5 tablet 0   sodium bicarbonate 650 MG tablet Take 650 mg by mouth 2 (two) times daily.     tacrolimus (PROGRAF) 0.5 MG capsule Take 1.5 mg by mouth 2 (two) times daily.      traMADol (ULTRAM) 50 MG tablet Take 1 tablet (50 mg total) by mouth every 6 (six) hours as needed. 12 tablet 0   No current facility-administered medications for this visit.    Allergies   Allergies as of 08/21/2022   (No Known Allergies)     Past Medical History   Past Medical History:  Diagnosis Date   Anemia    Takes iron shots once a month  Chorea    COVID-19    2021   Diabetes mellitus without complication (HCC)    Dyspnea    Essential hypertension    Hypercholesteremia    Hypothyroidism    Neuromuscular disorder (HCC)    Pneumonia    PONV (postoperative nausea and vomiting)    Renal failure    Has had kidney transplant 2008   Sleep apnea    Mild. Does not require CPAP use    Past Surgical History   Past Surgical History:  Procedure Laterality Date   AMPUTATION Right 06/27/2016   Procedure: AMPUTATION 5TH TOE RIGHT FOOT;  Surgeon: Ferman Hamming, DPM;  Location: [facility];  Service: Podiatry;  Laterality: Right;   AMPUTATION TOE Left 09/02/2015   Procedure: AMPUTATION  2ND TOE LEFT FOOT;  Surgeon: Franky Macho, MD;  Location: [facility];  Service: General;  Laterality: Left;   AV FISTULA  PLACEMENT Right 06/27/2022   Procedure: RIGHT BRACHIOCEPHALIC ARTERIOVENOUS (AV) FISTULA CREATION;  Surgeon: Leonie Douglas, MD;  Location: [facility];  Service: Vascular;  Laterality: Right;   BIOPSY  11/24/2021   Procedure: BIOPSY;  Surgeon: Corbin Ade, MD;  Location: [facility];  Service: Endoscopy;;   CATARACT EXTRACTION Bilateral    COLONOSCOPY  01/2014   done in [place]. patient reports h/o colon polyps on TCS in 2010, 2015 colonoscopy normal but advised to come back in 2020.    COLONOSCOPY WITH PROPOFOL N/A 01/19/2019   Procedure: COLONOSCOPY WITH PROPOFOL;  Surgeon: Corbin Ade, MD;  Location: [facility];  Service: Endoscopy;  Laterality: N/A;  9:45am   COLONOSCOPY WITH PROPOFOL N/A 11/24/2021   pancolonic diverticulosis, three polyps, adenomas, negative microscopic colitis   IR FLUORO GUIDE CV LINE RIGHT  03/14/2022   IR PERC TUN PERIT CATH WO PORT S&I /IMAG  03/14/2022   IR REMOVAL TUN CV CATH W/O FL  03/19/2022   IR US GUIDE VASC ACCESS RIGHT  03/14/2022   KIDNEY TRANSPLANT Right 2010   POLYPECTOMY  01/19/2019   Procedure: POLYPECTOMY;  Surgeon: Corbin Ade, MD;  Location: [facility];  Service: Endoscopy;;   POLYPECTOMY  11/24/2021   Procedure: POLYPECTOMY;  Surgeon: Corbin Ade, MD;  Location: [facility];  Service: Endoscopy;;    Past Family History   Family History  Problem Relation Age of Onset   Colon cancer Maternal Aunt 71       [facility] from [facility]    Past Social History   Social History   Socioeconomic History   Marital status: Widowed    Spouse name: Not on file   Number of children: Not on file   Years of education: Not on file   Highest education level: Not on file  Occupational History   Not on file  Tobacco Use   Smoking status: Never    Passive exposure: Never   Smokeless tobacco: Never  Vaping Use   Vaping Use: Never used  Substance and Sexual Activity   Alcohol use: Not Currently    Comment: As of 09/02/2018: Glass of wine  1-2 times a month   Drug use: No   Sexual activity: Yes    Birth control/protection: None  Other Topics Concern   Not on file  Social History Narrative   Not on file   Social Determinants of Health   Financial Resource Strain: Not on file  Food Insecurity: No Food Insecurity (03/09/2022)   Hunger Vital Sign    Worried About Running Out of  Food in the Last Year: Never true    Ran Out of Food in the Last Year: Never true  Transportation Needs: No Transportation Needs (03/09/2022)   PRAPARE - Administrator, Civil Service (Medical): No    Lack of Transportation (Non-Medical): No  Physical Activity: Not on file  Stress: Not on file  Social Connections: Not on file  Intimate Partner Violence: Not At Risk (03/09/2022)   Humiliation, Afraid, Rape, and Kick questionnaire    Fear of Current or Ex-Partner: No    Emotionally Abused: No    Physically Abused: No    Sexually Abused: No    Review of Systems   General: Negative for anorexia, weight loss, fever, chills, fatigue, weakness. ENT: Negative for hoarseness, difficulty swallowing , nasal congestion. CV: Negative for chest pain, angina, palpitations, dyspnea on exertion, peripheral edema.  Respiratory: Negative for dyspnea at rest, dyspnea on exertion, cough, sputum, wheezing.  GI: See history of present illness. GU:  Negative for dysuria, hematuria, urinary incontinence, urinary frequency, nocturnal urination.  Endo: Negative for unusual weight change.     Physical Exam   There were no vitals taken for this visit.   General: Well-nourished, well-developed in no acute distress.  Eyes: No icterus. Mouth: Oropharyngeal mucosa moist and pink , no lesions erythema or exudate. Lungs: Clear to auscultation bilaterally.  Heart: Regular rate and rhythm, no murmurs rubs or gallops.  Abdomen: Bowel sounds are normal, nontender, nondistended, no hepatosplenomegaly or masses,  no abdominal bruits or hernia , no rebound or  guarding.  Rectal: ***  Extremities: No lower extremity edema. No clubbing or deformities. Neuro: Alert and oriented x 4   Skin: Warm and dry, no jaundice.   Psych: Alert and cooperative, normal mood and affect.  Labs   *** Imaging Studies   VAS US DUPLEX DIALYSIS ACCESS (AVF, AVG)  Result Date: 08/14/2022 DIALYSIS ACCESS Patient Name:  ZAVIYAR RAHAL  Date of Exam:   08/14/2022 Medical Rec #: [id number]     Accession #:    [id number] Date of Birth: 1955-10-28     Patient Gender: M Patient Age:   35 years Exam Location:  [facility] Procedure:      VAS US DUPLEX DIALYSIS ACCESS (AVF, AVG) Referring Phys: Emilie Rutter --------------------------------------------------------------------------------  Reason for Exam: Routine follow up. Access Site: Right Upper Extremity. Access Type: Brachial-cephalic AVF 06/27/2022 Performing Technologist: Elita Quick RVT  Examination Guidelines: A complete evaluation includes B-mode imaging, spectral Doppler, color Doppler, and power Doppler as needed of all accessible portions of each vessel. Unilateral testing is considered an integral part of a complete examination. Limited examinations for reoccurring indications may be performed as noted.  Findings: +--------------------+----------+-----------------+--------+ AVF                 PSV (cm/s)Flow Vol (mL/min)Comments +--------------------+----------+-----------------+--------+ Native artery inflow   294          1743                +--------------------+----------+-----------------+--------+ AVF Anastomosis        787                              +--------------------+----------+-----------------+--------+  +------------+----------+-------------+-----------+----------------------------+ OUTFLOW VEINPSV (cm/s)Diameter (cm)Depth (cm)           Describe           +------------+----------+-------------+-----------+----------------------------+ Shoulder       125  0.69         1.19                                 +------------+----------+-------------+-----------+----------------------------+ Prox UA        172        0.66        44.00                                +------------+----------+-------------+-----------+----------------------------+ Mid UA         185        0.60        0.77     competing branch and 0.20                                                         cm, 42 cm/s          +------------+----------+-------------+-----------+----------------------------+ Dist UA     181 / 151  0.53 / 0.69 0.48 / 0.28  competing branch and 49                                                         cm/s, 0.15 cm         +------------+----------+-------------+-----------+----------------------------+ AC Fossa       252        0.86        0.19                                 +------------+----------+-------------+-----------+----------------------------+   Summary: Patent right Brachial Cephalic AVF with increased velocity at tortuosity at anastomosis site.  *See table(s) above for measurements and observations.  Diagnosing physician: Sherald Hess MD Electronically signed by Sherald Hess MD on 08/14/2022 at 3:35:13 PM.    --------------------------------------------------------------------------------   Final     Assessment       PLAN   ***   Leanna Battles. Melvyn Neth, MHS, PA-C [facility]

## 2022-08-21 ENCOUNTER — Ambulatory Visit: Payer: Medicare Other | Admitting: Gastroenterology

## 2022-08-21 ENCOUNTER — Encounter: Payer: Self-pay | Admitting: Gastroenterology

## 2022-08-22 ENCOUNTER — Ambulatory Visit (HOSPITAL_COMMUNITY)
Admission: RE | Admit: 2022-08-22 | Discharge: 2022-08-22 | Disposition: A | Discharge status: Home / Self Care | Payer: Medicare Other | Source: Ambulatory Visit | Attending: Nephrology | Admitting: Nephrology

## 2022-08-22 VITALS — BP 152/64 | HR 73 | Temp 97.3°F | Resp 20

## 2022-08-22 DIAGNOSIS — Z7682 Awaiting organ transplant status: Secondary | ICD-10-CM | POA: Diagnosis not present

## 2022-08-22 DIAGNOSIS — N183 Chronic kidney disease, stage 3 unspecified: Secondary | ICD-10-CM | POA: Diagnosis present

## 2022-08-22 LAB — CBC WITH DIFFERENTIAL/PLATELET
Abs Immature Granulocytes: 0.07 10*3/uL (ref 0.00–0.07)
Basophils Absolute: 0.1 10*3/uL (ref 0.0–0.1)
Basophils Relative: 1 %
Eosinophils Absolute: 0.1 10*3/uL (ref 0.0–0.5)
Eosinophils Relative: 2 %
HCT: 35.2 % — ABNORMAL LOW (ref 39.0–52.0)
Hemoglobin: 10.9 g/dL — ABNORMAL LOW (ref 13.0–17.0)
Immature Granulocytes: 1 %
Lymphocytes Relative: 32 %
Lymphs Abs: 2.7 10*3/uL (ref 0.7–4.0)
MCH: 28.2 pg (ref 26.0–34.0)
MCHC: 31 g/dL (ref 30.0–36.0)
MCV: 91 fL (ref 80.0–100.0)
Monocytes Absolute: 0.9 10*3/uL (ref 0.1–1.0)
Monocytes Relative: 10 %
Neutro Abs: 4.7 10*3/uL (ref 1.7–7.7)
Neutrophils Relative %: 54 %
Platelets: 298 10*3/uL (ref 150–400)
RBC: 3.87 MIL/uL — ABNORMAL LOW (ref 4.22–5.81)
RDW: 15.9 % — ABNORMAL HIGH (ref 11.5–15.5)
WBC: 8.5 10*3/uL (ref 4.0–10.5)
nRBC: 0 % (ref 0.0–0.2)

## 2022-08-22 LAB — RENAL FUNCTION PANEL
Albumin: 3.6 g/dL (ref 3.5–5.0)
Anion gap: 10 (ref 5–15)
BUN: 138 mg/dL — ABNORMAL HIGH (ref 8–23)
CO2: 21 mmol/L — ABNORMAL LOW (ref 22–32)
Calcium: 8.2 mg/dL — ABNORMAL LOW (ref 8.9–10.3)
Chloride: 108 mmol/L (ref 98–111)
Creatinine, Ser: 3.67 mg/dL — ABNORMAL HIGH (ref 0.61–1.24)
GFR, Estimated: 17 mL/min — ABNORMAL LOW (ref >60)
Glucose, Bld: 118 mg/dL — ABNORMAL HIGH (ref 70–99)
Phosphorus: 7 mg/dL — ABNORMAL HIGH (ref 2.5–4.6)
Potassium: 3.9 mmol/L (ref 3.5–5.1)
Sodium: 139 mmol/L (ref 135–145)

## 2022-08-22 LAB — IRON AND TIBC
Iron: 50 ug/dL (ref 45–182)
Saturation Ratios: 15 % — ABNORMAL LOW (ref 17.9–39.5)
TIBC: 325 ug/dL (ref 250–450)
UIBC: 275 ug/dL

## 2022-08-22 LAB — FERRITIN: Ferritin: 124 ng/mL (ref 24–336)

## 2022-08-22 LAB — POCT HEMOGLOBIN-HEMACUE: Hemoglobin: 10.7 g/dL — ABNORMAL LOW (ref 13.0–17.0)

## 2022-08-22 MED ORDER — EPOETIN ALFA 40000 UNIT/ML IJ SOLN
40000.0000 [IU] | INTRAMUSCULAR | Status: DC
  Administered 2022-08-22: 40000 [IU] via SUBCUTANEOUS

## 2022-08-22 MED ORDER — EPOETIN ALFA 40000 UNIT/ML IJ SOLN
INTRAMUSCULAR | Status: AC
  Filled 2022-08-22: qty 1

## 2022-08-28 DIAGNOSIS — E875 Hyperkalemia: Secondary | ICD-10-CM | POA: Diagnosis not present

## 2022-08-28 DIAGNOSIS — E872 Acidosis, unspecified: Secondary | ICD-10-CM | POA: Diagnosis not present

## 2022-08-28 DIAGNOSIS — D631 Anemia in chronic kidney disease: Secondary | ICD-10-CM | POA: Diagnosis not present

## 2022-08-28 DIAGNOSIS — I120 Hypertensive chronic kidney disease with stage 5 chronic kidney disease or end stage renal disease: Secondary | ICD-10-CM | POA: Diagnosis not present

## 2022-08-28 DIAGNOSIS — Z79899 Other long term (current) drug therapy: Secondary | ICD-10-CM | POA: Diagnosis not present

## 2022-08-28 DIAGNOSIS — N179 Acute kidney failure, unspecified: Secondary | ICD-10-CM | POA: Diagnosis not present

## 2022-08-28 DIAGNOSIS — N185 Chronic kidney disease, stage 5: Secondary | ICD-10-CM | POA: Diagnosis not present

## 2022-08-28 DIAGNOSIS — E1122 Type 2 diabetes mellitus with diabetic chronic kidney disease: Secondary | ICD-10-CM | POA: Diagnosis not present

## 2022-08-28 DIAGNOSIS — Z940 Kidney transplant status: Secondary | ICD-10-CM | POA: Diagnosis not present

## 2022-08-28 DIAGNOSIS — I770 Arteriovenous fistula, acquired: Secondary | ICD-10-CM | POA: Diagnosis not present

## 2022-09-07 ENCOUNTER — Encounter (HOSPITAL_COMMUNITY): Payer: Self-pay | Admitting: Vascular Surgery

## 2022-09-07 ENCOUNTER — Encounter (HOSPITAL_COMMUNITY): Payer: Medicare Other

## 2022-09-07 NOTE — Progress Notes (Addendum)
I have left a message 3 times today asking  Nathan Tyler to return my call, no call. I called Nathan Tyler wife,  Nathan Tyler I left a message asking her or her husband to  call me back.  I called Dr. Verita Lamb office and spoke with [facility] if she has any other numbers, she does not, she will call patient and ask him to call the hospital.  I called Nathan Tyler at [phone], he answered. Patient has a Omi pod insulin pump, patient reports that [name],[name].  I instructed Nathan Tyler to  decrease basal rate by 20% at midnight Sunday. If blood glucose is < 70 to follow the protocol that he has been given.  I asked patient to bring extra supplies for Omi pod. I notified Nathan Tyler , Diabetic  coordinator of his arrival at 0700 Monday AM.  Nathan Tyler denies chest pain or shortness of breath. Patient denies having any s/s of Covid in his household, also denies any known exposure to Covid.  Nathan Tyler denies any s/s of upper or lower respiratory in the past 8 weeks.   Nathan Tyler PCP is Dr Nita Sells.

## 2022-09-09 NOTE — Anesthesia Preprocedure Evaluation (Addendum)
Anesthesia Evaluation  Patient identified by MRN, date of birth, ID band Patient awake    Reviewed: Allergy & Precautions, NPO status , Patient's Chart, lab work & pertinent test results, reviewed documented beta blocker date and time   History of Anesthesia Complications (+) PONV and history of anesthetic complications  Airway Mallampati: II  TM Distance: >3 FB Neck ROM: Full    Dental no notable dental hx.    Pulmonary sleep apnea    Pulmonary exam normal        Cardiovascular hypertension, Pt. on medications and Pt. on home beta blockers +CHF  Normal cardiovascular exam  TTE 02/2022: EF 55-60%, grade II DD, RV systolic function mildly reduced, mild RVE, moderate LAE, valves ok     Neuro/Psych negative neurological ROS     GI/Hepatic negative GI ROS,,,(+) Hepatitis -, C  Endo/Other  diabetes, Type 2, Insulin DependentHypothyroidism    Renal/GU CRFRenal disease (s/p renal transplant 2008, now with ESRD)  negative genitourinary   Musculoskeletal negative musculoskeletal ROS (+)    Abdominal   Peds  Hematology negative hematology ROS (+)   Anesthesia Other Findings Day of surgery medications reviewed with patient.  Reproductive/Obstetrics                              Anesthesia Physical Anesthesia Plan  ASA: 4  Anesthesia Plan: MAC and Regional   Post-op Pain Management: Tylenol PO (pre-op)*   Induction:   PONV Risk Score and Plan: 2 and Treatment may vary due to age or medical condition, Propofol infusion, TIVA and Ondansetron  Airway Management Planned: Natural Airway and Simple Face Mask  Additional Equipment: None  Intra-op Plan:   Post-operative Plan:   Informed Consent: I have reviewed the patients History and Physical, chart, labs and discussed the procedure including the risks, benefits and alternatives for the proposed anesthesia with the patient or authorized  representative who has indicated his/her understanding and acceptance.       Plan Discussed with: CRNA  Anesthesia Plan Comments:          Anesthesia Quick Evaluation

## 2022-09-10 ENCOUNTER — Ambulatory Visit (HOSPITAL_BASED_OUTPATIENT_CLINIC_OR_DEPARTMENT_OTHER): Payer: Medicare Other | Admitting: Anesthesiology

## 2022-09-10 ENCOUNTER — Other Ambulatory Visit: Payer: Self-pay

## 2022-09-10 ENCOUNTER — Ambulatory Visit (HOSPITAL_COMMUNITY)
Admission: RE | Admit: 2022-09-10 | Discharge: 2022-09-10 | Disposition: A | Discharge status: Home / Self Care | Payer: Medicare Other | Attending: Vascular Surgery | Admitting: Vascular Surgery

## 2022-09-10 ENCOUNTER — Encounter (HOSPITAL_COMMUNITY): Payer: Self-pay | Admitting: Vascular Surgery

## 2022-09-10 ENCOUNTER — Ambulatory Visit (HOSPITAL_COMMUNITY): Payer: Medicare Other | Admitting: Anesthesiology

## 2022-09-10 ENCOUNTER — Encounter (HOSPITAL_COMMUNITY)
Admission: RE | Disposition: A | Discharge status: Home / Self Care | Payer: Self-pay | Source: Home / Self Care | Attending: Vascular Surgery

## 2022-09-10 DIAGNOSIS — Y830 Surgical operation with transplant of whole organ as the cause of abnormal reaction of the patient, or of later complication, without mention of misadventure at the time of the procedure: Secondary | ICD-10-CM | POA: Insufficient documentation

## 2022-09-10 DIAGNOSIS — E1122 Type 2 diabetes mellitus with diabetic chronic kidney disease: Secondary | ICD-10-CM

## 2022-09-10 DIAGNOSIS — I132 Hypertensive heart and chronic kidney disease with heart failure and with stage 5 chronic kidney disease, or end stage renal disease: Secondary | ICD-10-CM

## 2022-09-10 DIAGNOSIS — Z7682 Awaiting organ transplant status: Secondary | ICD-10-CM | POA: Diagnosis not present

## 2022-09-10 DIAGNOSIS — N186 End stage renal disease: Secondary | ICD-10-CM

## 2022-09-10 DIAGNOSIS — I509 Heart failure, unspecified: Secondary | ICD-10-CM | POA: Diagnosis not present

## 2022-09-10 DIAGNOSIS — Z79624 Long term (current) use of inhibitors of nucleotide synthesis: Secondary | ICD-10-CM | POA: Diagnosis not present

## 2022-09-10 DIAGNOSIS — Z794 Long term (current) use of insulin: Secondary | ICD-10-CM | POA: Diagnosis not present

## 2022-09-10 DIAGNOSIS — T82898A Other specified complication of vascular prosthetic devices, implants and grafts, initial encounter: Secondary | ICD-10-CM

## 2022-09-10 DIAGNOSIS — T8612 Kidney transplant failure: Secondary | ICD-10-CM

## 2022-09-10 DIAGNOSIS — Z79621 Long term (current) use of calcineurin inhibitor: Secondary | ICD-10-CM | POA: Insufficient documentation

## 2022-09-10 DIAGNOSIS — N185 Chronic kidney disease, stage 5: Secondary | ICD-10-CM | POA: Diagnosis not present

## 2022-09-10 DIAGNOSIS — N189 Chronic kidney disease, unspecified: Secondary | ICD-10-CM

## 2022-09-10 DIAGNOSIS — Z79899 Other long term (current) drug therapy: Secondary | ICD-10-CM | POA: Insufficient documentation

## 2022-09-10 DIAGNOSIS — Z940 Kidney transplant status: Secondary | ICD-10-CM | POA: Diagnosis not present

## 2022-09-10 DIAGNOSIS — Z79890 Hormone replacement therapy (postmenopausal): Secondary | ICD-10-CM | POA: Diagnosis not present

## 2022-09-10 HISTORY — PX: BASCILIC VEIN TRANSPOSITION: SHX5742

## 2022-09-10 HISTORY — DX: Inflammatory liver disease, unspecified: K75.9

## 2022-09-10 HISTORY — DX: Malignant (primary) neoplasm, unspecified: C80.1

## 2022-09-10 LAB — POCT I-STAT, CHEM 8
BUN: >130 mg/dL — ABNORMAL HIGH (ref 8–23)
Calcium, Ion: 1.16 mmol/L (ref 1.15–1.40)
Chloride: 112 mmol/L — ABNORMAL HIGH (ref 98–111)
Creatinine, Ser: 3.9 mg/dL — ABNORMAL HIGH (ref 0.61–1.24)
Glucose, Bld: 103 mg/dL — ABNORMAL HIGH (ref 70–99)
HCT: 34 % — ABNORMAL LOW (ref 39.0–52.0)
Hemoglobin: 11.6 g/dL — ABNORMAL LOW (ref 13.0–17.0)
Potassium: 4.7 mmol/L (ref 3.5–5.1)
Sodium: 144 mmol/L (ref 135–145)
TCO2: 22 mmol/L (ref 22–32)

## 2022-09-10 LAB — GLUCOSE, CAPILLARY
Glucose-Capillary: 92 mg/dL (ref 70–99)
Glucose-Capillary: 105 mg/dL — ABNORMAL HIGH (ref 70–99)
Glucose-Capillary: 128 mg/dL — ABNORMAL HIGH (ref 70–99)

## 2022-09-10 SURGERY — TRANSPOSITION, VEIN, BASILIC
Anesthesia: Monitor Anesthesia Care | Laterality: Right

## 2022-09-10 MED ORDER — LACTATED RINGERS IV SOLN: INTRAVENOUS | Status: DC

## 2022-09-10 MED ORDER — PROPOFOL 500 MG/50ML IV EMUL
INTRAVENOUS | Status: DC | PRN
  Administered 2022-09-10: 100 ug/kg/min via INTRAVENOUS

## 2022-09-10 MED ORDER — EPINEPHRINE PF 1 MG/ML IJ SOLN
INTRAMUSCULAR | Status: DC | PRN
  Administered 2022-09-10: .15 mg via INTRAMUSCULAR

## 2022-09-10 MED ORDER — TRAMADOL HCL 50 MG PO TABS: 50.0000 mg | ORAL_TABLET | Freq: Four times a day (QID) | ORAL | 0 refills | Status: DC | PRN

## 2022-09-10 MED ORDER — LIDOCAINE-EPINEPHRINE (PF) 1 %-1:200000 IJ SOLN
INTRAMUSCULAR | Status: AC
  Filled 2022-09-10: qty 30

## 2022-09-10 MED ORDER — EPHEDRINE 5 MG/ML INJ
INTRAVENOUS | Status: AC
  Filled 2022-09-10: qty 5

## 2022-09-10 MED ORDER — ACETAMINOPHEN 500 MG PO TABS
1000.0000 mg | ORAL_TABLET | Freq: Once | ORAL | Status: AC
  Administered 2022-09-10: 1000 mg via ORAL
  Filled 2022-09-10: qty 2

## 2022-09-10 MED ORDER — HEPARIN 6000 UNIT IRRIGATION SOLUTION
Status: AC
  Filled 2022-09-10: qty 500

## 2022-09-10 MED ORDER — FENTANYL CITRATE (PF) 100 MCG/2ML IJ SOLN
INTRAMUSCULAR | Status: AC
  Administered 2022-09-10: 50 ug via INTRAVENOUS
  Filled 2022-09-10: qty 2

## 2022-09-10 MED ORDER — CHLORHEXIDINE GLUCONATE 0.12 % MT SOLN
15.0000 mL | Freq: Once | OROMUCOSAL | Status: AC
  Administered 2022-09-10: 15 mL via OROMUCOSAL
  Filled 2022-09-10: qty 15

## 2022-09-10 MED ORDER — MEPIVACAINE HCL (PF) 2 % IJ SOLN
INTRAMUSCULAR | Status: DC | PRN
  Administered 2022-09-10: 30 mL

## 2022-09-10 MED ORDER — FENTANYL CITRATE (PF) 100 MCG/2ML IJ SOLN: 25.0000 ug | INTRAMUSCULAR | Status: DC | PRN

## 2022-09-10 MED ORDER — FENTANYL CITRATE (PF) 100 MCG/2ML IJ SOLN: 50.0000 ug | Freq: Once | INTRAMUSCULAR | Status: AC

## 2022-09-10 MED ORDER — EPHEDRINE SULFATE-NACL 50-0.9 MG/10ML-% IV SOSY
PREFILLED_SYRINGE | INTRAVENOUS | Status: DC | PRN
  Administered 2022-09-10 (×2): 5 mg via INTRAVENOUS

## 2022-09-10 MED ORDER — HEPARIN 6000 UNIT IRRIGATION SOLUTION
Status: DC | PRN
  Administered 2022-09-10: 1

## 2022-09-10 MED ORDER — 0.9 % SODIUM CHLORIDE (POUR BTL) OPTIME
TOPICAL | Status: DC | PRN
  Administered 2022-09-10: 1000 mL

## 2022-09-10 MED ORDER — CEFAZOLIN SODIUM-DEXTROSE 2-4 GM/100ML-% IV SOLN
2.0000 g | INTRAVENOUS | Status: AC
  Administered 2022-09-10: 2 g via INTRAVENOUS
  Filled 2022-09-10: qty 100

## 2022-09-10 MED ORDER — ORAL CARE MOUTH RINSE: 15.0000 mL | Freq: Once | OROMUCOSAL | Status: AC

## 2022-09-10 MED ORDER — OXYCODONE HCL 5 MG PO TABS: 5.0000 mg | ORAL_TABLET | Freq: Once | ORAL | Status: DC | PRN

## 2022-09-10 MED ORDER — CHLORHEXIDINE GLUCONATE 4 % EX SOLN: 60.0000 mL | Freq: Once | CUTANEOUS | Status: DC

## 2022-09-10 MED ORDER — ONDANSETRON HCL 4 MG/2ML IJ SOLN
INTRAMUSCULAR | Status: DC | PRN
  Administered 2022-09-10: 4 mg via INTRAVENOUS

## 2022-09-10 MED ORDER — SODIUM CHLORIDE 0.9 % IV SOLN: INTRAVENOUS | Status: DC

## 2022-09-10 MED ORDER — OXYCODONE HCL 5 MG/5ML PO SOLN: 5.0000 mg | Freq: Once | ORAL | Status: DC | PRN

## 2022-09-10 MED ORDER — PAPAVERINE HCL 30 MG/ML IJ SOLN
INTRAMUSCULAR | Status: AC
  Filled 2022-09-10: qty 2

## 2022-09-10 MED ORDER — MIDAZOLAM HCL 2 MG/2ML IJ SOLN
INTRAMUSCULAR | Status: AC
  Administered 2022-09-10: 1 mg via INTRAVENOUS
  Filled 2022-09-10: qty 2

## 2022-09-10 MED ORDER — MIDAZOLAM HCL 2 MG/2ML IJ SOLN: 1.0000 mg | Freq: Once | INTRAMUSCULAR | Status: AC

## 2022-09-10 SURGICAL SUPPLY — 36 items
ADH SKN CLS LQ APL DERMABOND (GAUZE/BANDAGES/DRESSINGS) ×1
APL PRP STRL LF DISP 70% ISPRP (MISCELLANEOUS) ×2
APL SKNCLS STERI-STRIP NONHPOA (GAUZE/BANDAGES/DRESSINGS) ×1
ARMBAND PINK RESTRICT EXTREMIT (MISCELLANEOUS) ×1 IMPLANT
BAG COUNTER SPONGE SURGICOUNT (BAG) ×1 IMPLANT
BAG SPNG CNTER NS LX DISP (BAG) ×1
BENZOIN TINCTURE PRP APPL 2/3 (GAUZE/BANDAGES/DRESSINGS) ×1 IMPLANT
CANISTER SUCT 3000ML PPV (MISCELLANEOUS) ×1 IMPLANT
CHLORAPREP W/TINT 26 (MISCELLANEOUS) ×1 IMPLANT
CLIP LIGATING EXTRA MED SLVR (CLIP) ×1 IMPLANT
CLIP LIGATING EXTRA SM BLUE (MISCELLANEOUS) ×1 IMPLANT
COVER PROBE W GEL 5X96 (DRAPES) ×1 IMPLANT
DERMABOND ADVANCED .7 DNX6 (GAUZE/BANDAGES/DRESSINGS) IMPLANT
ELECT REM PT RETURN 9FT ADLT (ELECTROSURGICAL) ×1
ELECTRODE REM PT RTRN 9FT ADLT (ELECTROSURGICAL) ×1 IMPLANT
GLOVE BIO SURGEON STRL SZ8 (GLOVE) ×1 IMPLANT
GOWN STRL REUS W/ TWL LRG LVL3 (GOWN DISPOSABLE) ×2 IMPLANT
GOWN STRL REUS W/ TWL XL LVL3 (GOWN DISPOSABLE) ×1 IMPLANT
GOWN STRL REUS W/TWL LRG LVL3 (GOWN DISPOSABLE) ×2
GOWN STRL REUS W/TWL XL LVL3 (GOWN DISPOSABLE) ×1
KIT BASIN OR (CUSTOM PROCEDURE TRAY) ×1 IMPLANT
KIT TURNOVER KIT B (KITS) ×1 IMPLANT
NS IRRIG 1000ML POUR BTL (IV SOLUTION) ×1 IMPLANT
PACK CV ACCESS (CUSTOM PROCEDURE TRAY) ×1 IMPLANT
PAD ARMBOARD 7.5X6 YLW CONV (MISCELLANEOUS) ×2 IMPLANT
SLING ARM FOAM STRAP LRG (SOFTGOODS) IMPLANT
SLING ARM FOAM STRAP MED (SOFTGOODS) IMPLANT
STRIP CLOSURE SKIN 1/2X4 (GAUZE/BANDAGES/DRESSINGS) ×2 IMPLANT
SUT MNCRL AB 4-0 PS2 18 (SUTURE) ×1 IMPLANT
SUT PROLENE 6 0 BV (SUTURE) ×1 IMPLANT
SUT SILK 2 0 SH (SUTURE) IMPLANT
SUT VIC AB 3-0 SH 27 (SUTURE) ×2
SUT VIC AB 3-0 SH 27X BRD (SUTURE) ×1 IMPLANT
TOWEL GREEN STERILE (TOWEL DISPOSABLE) ×1 IMPLANT
UNDERPAD 30X36 HEAVY ABSORB (UNDERPADS AND DIAPERS) ×1 IMPLANT
WATER STERILE IRR 1000ML POUR (IV SOLUTION) ×1 IMPLANT

## 2022-09-10 NOTE — Op Note (Signed)
DATE OF SERVICE: 09/10/2022  PATIENT:  Nathan Tyler  67 y.o. male  PRE-OPERATIVE DIAGNOSIS:  ESRD, failing kidney transplant  POST-OPERATIVE DIAGNOSIS:  Same  PROCEDURE:   Right brachiocephalic arteriovenous fistula transposition  SURGEON:  Surgeon(s) and Role:    * Leonie Douglas, MD - Primary  ASSISTANT: Lianne Cure, PA-C  An experienced assistant was required given the complexity of this procedure and the standard of surgical care. My assistant helped with exposure through counter tension, suctioning, ligation and retraction to better visualize the surgical field.  My assistant expedited sewing during the case by following my sutures. Wherever I use the term "we" in the report, my assistant actively helped me with that portion of the procedure.  ANESTHESIA:   regional and MAC  EBL: minimal  BLOOD ADMINISTERED:none  DRAINS: none   LOCAL MEDICATIONS USED:  NONE  SPECIMEN:  none  COUNTS: confirmed correct.  TOURNIQUET:  none  PATIENT DISPOSITION:  PACU - hemodynamically stable.   Delay start of Pharmacological VTE agent (>24hrs) due to surgical blood loss or risk of bleeding: no  INDICATION FOR PROCEDURE: Nathan Tyler is a 67 y.o. male with ESRD. His renal transplant is failing. He needs new dialysis access. I created a brachiocephalic arteriovenous fistula for him several weeks ago. This was found to be too deep to cannulate on duplex ultrasound. After careful discussion of risks, benefits, and alternatives the patient was offered transposition. The patient understood and wished to proceed.  OPERATIVE FINDINGS: healthy fistula. Transposed just under the skin through three skip incisions. Good doppler flow in radial artery and fistula at completion.  DESCRIPTION OF PROCEDURE: After identification of the patient in the pre-operative holding area, the patient was transferred to the operating room. The patient was positioned supine on the operating room table. Anesthesia  was induced. The right arm was prepped and draped in standard fashion. A surgical pause was performed confirming correct patient, procedure, and operative location.  Using intraoperative ultrasound the course of the right cephalic vein was marked on the skin.  3 skip incisions were made over the course of the cephalic vein fistula.  These were carried down through subcutaneous tissue until the fistula was encountered.  The fascia was skeletonized from the deltoid to the anastomosis, taking care to ligate and divide sidebranches.   A subcutaneous tunnel was created over the biceps using a sheath tunneling device.  Patient was heparinized.  The fistula near the anastomosis was clamped.  The outflow in the axilla was clamped.  The fistula was divided.  The fistula was marked to ensure no twisting or kinking while delivering the fistula through the tunnel.  The fistula was tunneled through the arm and delivered near the previous anastomosis.  The fistula was spatulated proximally and distally.  The fistula was reanastomosed end-to-end using continuous running suture of 5-0 Prolene.  A palpable thrill was felt over the subcutaneous course of the tunnel.  Doppler flow was excellent in the proximal and distal fistula and the radial artery.  The wounds were copiously irrigated.  Hemostasis was ensured in the surgical bed.  The wounds were closed in layers using 3-0 Vicryl and 4-0 Monocryl.  Upon completion of the case instrument and sharps counts were confirmed correct. The patient was transferred to the PACU in good condition. I was present for all portions of the procedure.  FOLLOW UP PLAN: Assuming a normal postoperative course, a VVS PA will see the patient in 6 weeks with AVF duplex.  Nathan Tyler. Nathan Antu, MD [facility] Phone Number: [phone] 09/10/2022 11:49 AM

## 2022-09-10 NOTE — Anesthesia Postprocedure Evaluation (Signed)
Anesthesia Post Note  Patient: Nathan Tyler  Procedure(s) Performed: RIGHT UPPER ARM SECOND STAGE BRACHIAL CEPHALIC VEIN TRANSPOSITION (Right)     Patient location during evaluation: PACU Anesthesia Type: Regional Level of consciousness: awake and alert Pain management: pain level controlled Vital Signs Assessment: post-procedure vital signs reviewed and stable Respiratory status: spontaneous breathing, nonlabored ventilation and respiratory function stable Cardiovascular status: blood pressure returned to baseline Postop Assessment: no apparent nausea or vomiting Anesthetic complications: no   No notable events documented.  Last Vitals:  Vitals:   09/10/22 1230 09/10/22 1245  BP: 134/66 138/63  Pulse: (!) 50 (!) 50  Resp: 20 18  Temp:    SpO2: 92% 94%    Last Pain:  Vitals:   09/10/22 1221  PainSc: 0-No pain                 Shanda Howells

## 2022-09-10 NOTE — Interval H&P Note (Signed)
History and Physical Interval Note:  09/10/2022 10:27 AM  Nathan Tyler  has presented today for surgery, with the diagnosis of Chronic kidney disease, unspecified CKD stage.  The various methods of treatment have been discussed with the patient and family. After consideration of risks, benefits and other options for treatment, the patient has consented to  Procedure(s): RIGHT UPPER ARM SECOND STAGE BASILIC VEIN TRANSPOSITION (Right) as a surgical intervention.  The patient's history has been reviewed, patient examined, no change in status, stable for surgery.  I have reviewed the patient's chart and labs.  Questions were answered to the patient's satisfaction.     Leonie Douglas

## 2022-09-10 NOTE — Progress Notes (Signed)
See my H&P addendum. I agree with PA Thomasena Edis with the exception of this is a brachiocephalic arteriovenous fistula. This needs transposition and side branch ligation. I discussed this with the family and they are understanding and willing to proceed.  Rande Brunt. Lenell Antu, MD [facility] Phone Number: [phone] 09/10/2022 10:28 AM

## 2022-09-10 NOTE — Anesthesia Procedure Notes (Signed)
Procedure Name: MAC Date/Time: 09/10/2022 10:54 AM  Performed by: De Nurse, CRNAPre-anesthesia Checklist: Patient identified, Suction available, Emergency Drugs available, Patient being monitored and Timeout performed Patient Re-evaluated:Patient Re-evaluated prior to induction Oxygen Delivery Method: Simple face mask

## 2022-09-10 NOTE — Discharge Instructions (Signed)
° °  [facility] [place] ° °Discharge Instructions ° °AV Fistula or Graft Surgery for Dialysis Access ° °Please refer to the following instructions for your post-procedure care. Your surgeon or physician assistant will discuss any changes with you. ° °Activity ° °You may drive the day following your surgery, if you are comfortable and no longer taking prescription pain medication. Resume full activity as the soreness in your incision resolves. ° °Bathing/Showering ° °You may shower after you go home. Keep your incision dry for 48 hours. Do not soak in a bathtub, hot tub, or swim until the incision heals completely. You may not shower if you have a hemodialysis catheter. ° °Incision Care ° °Clean your incision with mild soap and water after 48 hours. Pat the area dry with a clean towel. You do not need a bandage unless otherwise instructed. Do not apply any ointments or creams to your incision. You may have skin glue on your incision. Do not peel it off. It will come off on its own in about one week. Your arm may swell a bit after surgery. To reduce swelling use pillows to elevate your arm so it is above your heart. Your doctor will tell you if you need to lightly wrap your arm with an ACE bandage. ° °Diet ° °Resume your normal diet. There are not special food restrictions following this procedure. In order to heal from your surgery, it is CRITICAL to get adequate nutrition. Your body requires vitamins, minerals, and protein. Vegetables are the best source of vitamins and minerals. Vegetables also provide the perfect balance of protein. Processed food has little nutritional value, so try to avoid this. ° °Medications ° °Resume taking all of your medications. If your incision is causing pain, you may take over-the counter pain relievers such as acetaminophen (Tylenol). If you were prescribed a stronger pain medication, please be aware these medications can cause nausea and constipation. Prevent  nausea by taking the medication with a snack or meal. Avoid constipation by drinking plenty of fluids and eating foods with high amount of fiber, such as fruits, vegetables, and grains. Do not take Tylenol if you are taking prescription pain medications. ° ° ° ° °Follow up °Your surgeon may want to see you in the office following your access surgery. If so, this will be arranged at the time of your surgery. ° °Please call us immediately for any of the following conditions: ° °Increased pain, redness, drainage (pus) from your incision site °Fever of 101 degrees or higher °Severe or worsening pain at your incision site °Hand pain or numbness. ° °Reduce your risk of vascular disease: ° °Stop smoking. If you would like help, call [facility] at 1-800-QUIT-NOW (1-800-784-8669) or [place] at 336-586-4000 ° °Manage your cholesterol °Maintain a desired weight °Control your diabetes °Keep your blood pressure down ° °Dialysis ° °It will take several weeks to several months for your new dialysis access to be ready for use. Your surgeon will determine when it is OK to use it. Your nephrologist will continue to direct your dialysis. You can continue to use your Permcath until your new access is ready for use. ° °If you have any questions, please call the office at [phone]. ° °

## 2022-09-10 NOTE — Transfer of Care (Signed)
Immediate Anesthesia Transfer of Care Note  Patient: [name]  Procedure(s) Performed: RIGHT UPPER ARM SECOND STAGE BRACHIAL CEPHALIC VEIN TRANSPOSITION (Right)  Patient Location: PACU  Anesthesia Type:MAC and Regional  Level of Consciousness: awake and oriented  Airway & Oxygen Therapy: Patient Spontanous Breathing and Patient connected to nasal cannula oxygen  Post-op Assessment: Report given to RN  Post vital signs: Reviewed and stable  Last Vitals:  Vitals Value Taken Time  BP 128/65   Temp    Pulse 54 09/10/22 1207  Resp 18 09/10/22 1207  SpO2 90 % 09/10/22 1207  Vitals shown include unvalidated device data.  Last Pain:  Vitals:   09/10/22 0813  PainSc: 0-No pain         Complications: No notable events documented.

## 2022-09-10 NOTE — Anesthesia Procedure Notes (Signed)
Anesthesia Regional Block: Supraclavicular block   Pre-Anesthetic Checklist: , timeout performed,  Correct Patient, Correct Site, Correct Laterality,  Correct Procedure, Correct Position, site marked,  Risks and benefits discussed,  Pre-op evaluation,  At surgeon's request and post-op pain management  Laterality: Right  Prep: Maximum Sterile Barrier Precautions used, chloraprep       Needles:  Injection technique: Single-shot  Needle Type: Echogenic Stimulator Needle     Needle Length: 9cm  Needle Gauge: 22     Additional Needles:   Procedures:,,,, ultrasound used (permanent image in chart),,    Narrative:  Start time: 09/10/2022 10:27 AM End time: 09/10/2022 10:30 AM Injection made incrementally with aspirations every 5 mL.  Performed by: Personally  Anesthesiologist: Kaylyn Layer, MD  Additional Notes: Risks, benefits, and alternative discussed. Patient gave consent for procedure. Patient prepped and draped in sterile fashion. Sedation administered, patient remains easily responsive to voice. Relevant anatomy identified with ultrasound guidance. Local anesthetic given in 5cc increments with no signs or symptoms of intravascular injection. No pain or paraesthesias with injection. Patient monitored throughout procedure with signs of LAST or immediate complications. Tolerated well. Ultrasound image placed in chart.  Nathan Greenhouse, MD

## 2022-09-11 ENCOUNTER — Encounter (HOSPITAL_COMMUNITY): Payer: Self-pay | Admitting: Vascular Surgery

## 2022-09-11 DIAGNOSIS — Z7682 Awaiting organ transplant status: Secondary | ICD-10-CM | POA: Diagnosis not present

## 2022-09-13 ENCOUNTER — Telehealth: Payer: Self-pay | Admitting: Physician Assistant

## 2022-09-13 NOTE — Telephone Encounter (Signed)
-----   Message from Leonie Douglas, MD sent at 09/10/2022 11:53 AM EDT ----- Shellia Carwin 09/10/2022 Procedure: Right brachiocephalic arteriovenous fistula transposition Assistant: Thomasena Edis Follow up: 6 weeks with VVS PA Studies for follow up: AVF duplex  Thank you! Elijah Birk

## 2022-09-13 NOTE — Telephone Encounter (Signed)
-----   Message from Lars Mage, [place] sent at 09/10/2022 12:00 PM EDT -----  Lenell Antu superficialization right AV fistula f/u for incision check on PA schdl. In 2-3 weeks

## 2022-09-14 NOTE — Telephone Encounter (Signed)
Appt has been scheduled.

## 2022-09-18 ENCOUNTER — Encounter (HOSPITAL_COMMUNITY): Payer: Medicare Other

## 2022-09-18 DIAGNOSIS — N189 Chronic kidney disease, unspecified: Secondary | ICD-10-CM | POA: Diagnosis not present

## 2022-09-18 DIAGNOSIS — Z940 Kidney transplant status: Secondary | ICD-10-CM | POA: Diagnosis not present

## 2022-09-18 DIAGNOSIS — E039 Hypothyroidism, unspecified: Secondary | ICD-10-CM | POA: Diagnosis not present

## 2022-09-18 NOTE — Unmapped (Signed)
[facility] Refill Coordination Note    Specialty Medication(s) to be Shipped:   Neurology: Gary Rogers    Other medication(s) to be shipped: No additional medications requested for fill at this time     Gary Rogers, DOB: 1955/09/05  Phone: [phone] (home)       All above HIPAA information was verified with patient.     Was a Nurse, learning disability used for this call? No    Completed refill call assessment today to schedule patient's medication shipment from the [facility] [phone]).  All relevant notes have been reviewed.     Specialty medication(s) and dose(s) confirmed: Regimen is correct and unchanged.   Changes to medications: Gary Rogers reports no changes at this time.  Changes to insurance: No  New side effects reported not previously addressed with a pharmacist or physician: None reported  Questions for the pharmacist: No    Confirmed patient received a Conservation officer, historic buildings and a Surveyor, mining with first shipment. The patient will receive a drug information handout for each medication shipped and additional FDA Medication Guides as required.       DISEASE/MEDICATION-SPECIFIC INFORMATION        N/A    SPECIALTY MEDICATION ADHERENCE     Medication Adherence    Patient reported X missed doses in the last month: 0  Specialty Medication: AUSTEDO 6 mg  Patient is on additional specialty medications: No  Patient is on more than two specialty medications: No  Any gaps in refill history greater than 2 weeks in the last 3 months: no  Demonstrates understanding of importance of adherence: yes              Were doses missed due to medication being on hold? No    Austedo 6 mg: 6 days of medicine on hand       REFERRAL TO PHARMACIST     Referral to the pharmacist: Not needed      [facility]     Shipping address confirmed in Epic.     Delivery Scheduled: Yes, Expected medication delivery date: 09/21/22.     Medication will be delivered via UPS to the prescription address in Epic WAM.    Gary Rogers   [facility] Specialty Technician

## 2022-09-19 ENCOUNTER — Encounter (HOSPITAL_COMMUNITY): Payer: Medicare Other

## 2022-09-19 ENCOUNTER — Ambulatory Visit (HOSPITAL_COMMUNITY)
Admission: RE | Admit: 2022-09-19 | Discharge: 2022-09-19 | Disposition: A | Discharge status: Home / Self Care | Payer: Medicare Other | Source: Ambulatory Visit | Attending: Nephrology | Admitting: Nephrology

## 2022-09-19 VITALS — BP 164/75 | HR 68 | Temp 97.4°F | Resp 18

## 2022-09-19 DIAGNOSIS — N183 Chronic kidney disease, stage 3 unspecified: Secondary | ICD-10-CM | POA: Insufficient documentation

## 2022-09-19 LAB — POCT HEMOGLOBIN-HEMACUE: Hemoglobin: 11.1 g/dL — ABNORMAL LOW (ref 13.0–17.0)

## 2022-09-19 MED ORDER — EPOETIN ALFA 40000 UNIT/ML IJ SOLN
40000.0000 [IU] | INTRAMUSCULAR | Status: DC
  Administered 2022-09-19: 40000 [IU] via SUBCUTANEOUS

## 2022-09-19 MED ORDER — EPOETIN ALFA 40000 UNIT/ML IJ SOLN
INTRAMUSCULAR | Status: AC
  Filled 2022-09-19: qty 1

## 2022-09-20 MED FILL — AUSTEDO 6 MG TABLET: ORAL | 30 days supply | Qty: 60 | Fill #5

## 2022-10-01 DIAGNOSIS — I770 Arteriovenous fistula, acquired: Secondary | ICD-10-CM | POA: Diagnosis not present

## 2022-10-01 DIAGNOSIS — E872 Acidosis, unspecified: Secondary | ICD-10-CM | POA: Diagnosis not present

## 2022-10-01 DIAGNOSIS — N179 Acute kidney failure, unspecified: Secondary | ICD-10-CM | POA: Diagnosis not present

## 2022-10-01 DIAGNOSIS — E1122 Type 2 diabetes mellitus with diabetic chronic kidney disease: Secondary | ICD-10-CM | POA: Diagnosis not present

## 2022-10-01 DIAGNOSIS — I120 Hypertensive chronic kidney disease with stage 5 chronic kidney disease or end stage renal disease: Secondary | ICD-10-CM | POA: Diagnosis not present

## 2022-10-01 DIAGNOSIS — Z940 Kidney transplant status: Secondary | ICD-10-CM | POA: Diagnosis not present

## 2022-10-01 DIAGNOSIS — D631 Anemia in chronic kidney disease: Secondary | ICD-10-CM | POA: Diagnosis not present

## 2022-10-01 DIAGNOSIS — E875 Hyperkalemia: Secondary | ICD-10-CM | POA: Diagnosis not present

## 2022-10-01 DIAGNOSIS — N2581 Secondary hyperparathyroidism of renal origin: Secondary | ICD-10-CM | POA: Diagnosis not present

## 2022-10-01 DIAGNOSIS — Z79899 Other long term (current) drug therapy: Secondary | ICD-10-CM | POA: Diagnosis not present

## 2022-10-01 DIAGNOSIS — N185 Chronic kidney disease, stage 5: Secondary | ICD-10-CM | POA: Diagnosis not present

## 2022-10-02 ENCOUNTER — Ambulatory Visit (INDEPENDENT_AMBULATORY_CARE_PROVIDER_SITE_OTHER): Payer: Medicare Other | Admitting: Physician Assistant

## 2022-10-02 VITALS — BP 157/76 | HR 62 | Temp 98.3°F | Resp 22 | Ht 71.0 in | Wt 215.0 lb

## 2022-10-02 DIAGNOSIS — N189 Chronic kidney disease, unspecified: Secondary | ICD-10-CM

## 2022-10-02 NOTE — Progress Notes (Signed)
    Postoperative Access Visit   History of Present Illness   Nathan Tyler is a 67 y.o. year old male who presents for postoperative follow-up for: right  brachiocephalic fistula superficialization by Dr. Lenell Antu  (Date: 09/10/22).  The patient's wounds are healed.  The patient denies steal symptoms.  The patient is able to complete their activities of daily living.  He has a transplanted kidney that is slowly failing per patient.  He however has not yet started on dialysis.  CKD managed by Dr. Arrie Aran.   Physical Examination   Vitals:   10/02/22 0933  BP: (!) 157/76  Pulse: 62  Resp: (!) 22  Temp: 98.3 F (36.8 C)  TempSrc: Temporal  SpO2: 97%  Weight: 215 lb (97.5 kg)  Height: 5\' 11"  (1.803 m)   Body mass index is 29.99 kg/m.  right arm Incisions are healed, palpable 1+ R radial pulse, hand grip is 5/5, sensation in digits is intact, palpable thrill, bruit can be auscultated     Medical Decision Making   Nathan Tyler is a 67 y.o. year old male who presents s/p right  brachiocephalic fistula superficialization  Patent fistula without signs or symptoms of steal syndrome The patient's access is ready for use should he be initiated on HD The patient may follow up on a prn basis   Emilie Rutter PA-C [facility] [place] Office: [phone]  Clinic MD: Lenell Antu

## 2022-10-04 DIAGNOSIS — B351 Tinea unguium: Secondary | ICD-10-CM | POA: Diagnosis not present

## 2022-10-04 DIAGNOSIS — E1142 Type 2 diabetes mellitus with diabetic polyneuropathy: Secondary | ICD-10-CM | POA: Diagnosis not present

## 2022-10-15 NOTE — Unmapped (Signed)
[facility] Refill Coordination Note    Specialty Medication(s) to be Shipped:   Neurology: Gary Rogers    Other medication(s) to be shipped: No additional medications requested for fill at this time     Gary Rogers, DOB: 19-Apr-1956  Phone: [phone] (home)       All above HIPAA information was verified with patient.     Was a Nurse, learning disability used for this call? No    Completed refill call assessment today to schedule patient's medication shipment from the [facility] [phone]).  All relevant notes have been reviewed.     Specialty medication(s) and dose(s) confirmed: Regimen is correct and unchanged.   Changes to medications: Gary Rogers reports no changes at this time.  Changes to insurance: No  New side effects reported not previously addressed with a pharmacist or physician: None reported  Questions for the pharmacist: No    Confirmed patient received a Conservation officer, historic buildings and a Surveyor, mining with first shipment. The patient will receive a drug information handout for each medication shipped and additional FDA Medication Guides as required.       DISEASE/MEDICATION-SPECIFIC INFORMATION        N/A    SPECIALTY MEDICATION ADHERENCE     Medication Adherence    Patient reported X missed doses in the last month: 0  Specialty Medication: AUSTEDO 6 mg              Were doses missed due to medication being on hold? No    Austedo 6 mg: 7 days of medicine on hand       REFERRAL TO PHARMACIST     Referral to the pharmacist: Not needed      [facility]     Shipping address confirmed in Epic.       Delivery Scheduled: Yes, Expected medication delivery date: 6/27.     Medication will be delivered via UPS to the prescription address in Epic WAM.    Gary Rogers, PharmD   [facility] Specialty Pharmacist

## 2022-10-16 DIAGNOSIS — E1169 Type 2 diabetes mellitus with other specified complication: Secondary | ICD-10-CM | POA: Diagnosis not present

## 2022-10-16 DIAGNOSIS — E039 Hypothyroidism, unspecified: Secondary | ICD-10-CM | POA: Diagnosis not present

## 2022-10-16 DIAGNOSIS — E782 Mixed hyperlipidemia: Secondary | ICD-10-CM | POA: Diagnosis not present

## 2022-10-17 ENCOUNTER — Ambulatory Visit (HOSPITAL_COMMUNITY)
Admission: RE | Admit: 2022-10-17 | Discharge: 2022-10-17 | Disposition: A | Discharge status: Home / Self Care | Payer: Medicare Other | Source: Ambulatory Visit | Attending: Nephrology | Admitting: Nephrology

## 2022-10-17 VITALS — BP 152/64 | HR 72 | Temp 97.4°F | Resp 20

## 2022-10-17 DIAGNOSIS — N183 Chronic kidney disease, stage 3 unspecified: Secondary | ICD-10-CM | POA: Insufficient documentation

## 2022-10-17 DIAGNOSIS — D631 Anemia in chronic kidney disease: Secondary | ICD-10-CM | POA: Insufficient documentation

## 2022-10-17 LAB — CBC WITH DIFFERENTIAL/PLATELET
Abs Immature Granulocytes: 0.06 10*3/uL (ref 0.00–0.07)
Basophils Absolute: 0 10*3/uL (ref 0.0–0.1)
Basophils Relative: 0 %
Eosinophils Absolute: 0.1 10*3/uL (ref 0.0–0.5)
Eosinophils Relative: 2 %
HCT: 35.8 % — ABNORMAL LOW (ref 39.0–52.0)
Hemoglobin: 11 g/dL — ABNORMAL LOW (ref 13.0–17.0)
Immature Granulocytes: 1 %
Lymphocytes Relative: 31 %
Lymphs Abs: 2.8 10*3/uL (ref 0.7–4.0)
MCH: 27.9 pg (ref 26.0–34.0)
MCHC: 30.7 g/dL (ref 30.0–36.0)
MCV: 90.9 fL (ref 80.0–100.0)
Monocytes Absolute: 0.8 10*3/uL (ref 0.1–1.0)
Monocytes Relative: 9 %
Neutro Abs: 5.1 10*3/uL (ref 1.7–7.7)
Neutrophils Relative %: 57 %
Platelets: 274 10*3/uL (ref 150–400)
RBC: 3.94 MIL/uL — ABNORMAL LOW (ref 4.22–5.81)
RDW: 14.6 % (ref 11.5–15.5)
WBC: 8.8 10*3/uL (ref 4.0–10.5)
nRBC: 0 % (ref 0.0–0.2)

## 2022-10-17 LAB — RENAL FUNCTION PANEL
Albumin: 3.5 g/dL (ref 3.5–5.0)
Anion gap: 15 (ref 5–15)
BUN: 130 mg/dL — ABNORMAL HIGH (ref 8–23)
CO2: 21 mmol/L — ABNORMAL LOW (ref 22–32)
Calcium: 8.4 mg/dL — ABNORMAL LOW (ref 8.9–10.3)
Chloride: 106 mmol/L (ref 98–111)
Creatinine, Ser: 5.52 mg/dL — ABNORMAL HIGH (ref 0.61–1.24)
GFR, Estimated: 10 mL/min — ABNORMAL LOW (ref >60)
Glucose, Bld: 88 mg/dL (ref 70–99)
Phosphorus: 6.9 mg/dL — ABNORMAL HIGH (ref 2.5–4.6)
Potassium: 4.7 mmol/L (ref 3.5–5.1)
Sodium: 142 mmol/L (ref 135–145)

## 2022-10-17 LAB — POCT HEMOGLOBIN-HEMACUE: Hemoglobin: 10.9 g/dL — ABNORMAL LOW (ref 13.0–17.0)

## 2022-10-17 MED ORDER — EPOETIN ALFA 40000 UNIT/ML IJ SOLN
40000.0000 [IU] | INTRAMUSCULAR | Status: DC
  Administered 2022-10-17: 40000 [IU] via SUBCUTANEOUS

## 2022-10-17 MED ORDER — EPOETIN ALFA 40000 UNIT/ML IJ SOLN
INTRAMUSCULAR | Status: AC
  Filled 2022-10-17: qty 1

## 2022-10-17 MED FILL — AUSTEDO 6 MG TABLET: ORAL | 30 days supply | Qty: 60 | Fill #6

## 2022-10-22 DIAGNOSIS — Z940 Kidney transplant status: Secondary | ICD-10-CM | POA: Diagnosis not present

## 2022-10-22 DIAGNOSIS — S62301D Unspecified fracture of second metacarpal bone, left hand, subsequent encounter for fracture with routine healing: Secondary | ICD-10-CM | POA: Diagnosis not present

## 2022-10-22 DIAGNOSIS — I5032 Chronic diastolic (congestive) heart failure: Secondary | ICD-10-CM | POA: Diagnosis not present

## 2022-10-22 DIAGNOSIS — J9601 Acute respiratory failure with hypoxia: Secondary | ICD-10-CM | POA: Diagnosis not present

## 2022-10-22 DIAGNOSIS — I130 Hypertensive heart and chronic kidney disease with heart failure and stage 1 through stage 4 chronic kidney disease, or unspecified chronic kidney disease: Secondary | ICD-10-CM | POA: Diagnosis not present

## 2022-10-22 DIAGNOSIS — E669 Obesity, unspecified: Secondary | ICD-10-CM | POA: Diagnosis not present

## 2022-10-22 DIAGNOSIS — D509 Iron deficiency anemia, unspecified: Secondary | ICD-10-CM | POA: Diagnosis not present

## 2022-10-22 DIAGNOSIS — I10 Essential (primary) hypertension: Secondary | ICD-10-CM | POA: Diagnosis not present

## 2022-10-22 DIAGNOSIS — E1169 Type 2 diabetes mellitus with other specified complication: Secondary | ICD-10-CM | POA: Diagnosis not present

## 2022-10-22 DIAGNOSIS — E782 Mixed hyperlipidemia: Secondary | ICD-10-CM | POA: Diagnosis not present

## 2022-10-22 DIAGNOSIS — N185 Chronic kidney disease, stage 5: Secondary | ICD-10-CM | POA: Diagnosis not present

## 2022-10-22 DIAGNOSIS — E1122 Type 2 diabetes mellitus with diabetic chronic kidney disease: Secondary | ICD-10-CM | POA: Diagnosis not present

## 2022-10-23 ENCOUNTER — Encounter (HOSPITAL_COMMUNITY): Payer: Medicare Other

## 2022-11-05 DIAGNOSIS — Z940 Kidney transplant status: Secondary | ICD-10-CM | POA: Diagnosis not present

## 2022-11-05 DIAGNOSIS — N189 Chronic kidney disease, unspecified: Secondary | ICD-10-CM | POA: Diagnosis not present

## 2022-11-09 DIAGNOSIS — N2581 Secondary hyperparathyroidism of renal origin: Secondary | ICD-10-CM | POA: Diagnosis not present

## 2022-11-09 DIAGNOSIS — I770 Arteriovenous fistula, acquired: Secondary | ICD-10-CM | POA: Diagnosis not present

## 2022-11-09 DIAGNOSIS — D631 Anemia in chronic kidney disease: Secondary | ICD-10-CM | POA: Diagnosis not present

## 2022-11-09 DIAGNOSIS — I120 Hypertensive chronic kidney disease with stage 5 chronic kidney disease or end stage renal disease: Secondary | ICD-10-CM | POA: Diagnosis not present

## 2022-11-09 DIAGNOSIS — E875 Hyperkalemia: Secondary | ICD-10-CM | POA: Diagnosis not present

## 2022-11-09 DIAGNOSIS — Z79899 Other long term (current) drug therapy: Secondary | ICD-10-CM | POA: Diagnosis not present

## 2022-11-09 DIAGNOSIS — E872 Acidosis, unspecified: Secondary | ICD-10-CM | POA: Diagnosis not present

## 2022-11-09 DIAGNOSIS — Z940 Kidney transplant status: Secondary | ICD-10-CM | POA: Diagnosis not present

## 2022-11-09 DIAGNOSIS — N179 Acute kidney failure, unspecified: Secondary | ICD-10-CM | POA: Diagnosis not present

## 2022-11-09 DIAGNOSIS — E1122 Type 2 diabetes mellitus with diabetic chronic kidney disease: Secondary | ICD-10-CM | POA: Diagnosis not present

## 2022-11-09 DIAGNOSIS — N185 Chronic kidney disease, stage 5: Secondary | ICD-10-CM | POA: Diagnosis not present

## 2022-11-09 DIAGNOSIS — G2401 Drug induced subacute dyskinesia: Principal | ICD-10-CM

## 2022-11-09 DIAGNOSIS — G255 Other chorea: Principal | ICD-10-CM

## 2022-11-09 MED ORDER — AUSTEDO 6 MG TABLET
ORAL_TABLET | Freq: Two times a day (BID) | ORAL | 6 refills | 30 days
Start: 2022-11-09 — End: ?

## 2022-11-10 NOTE — Unmapped (Signed)
[facility] Refill Coordination Note    Specialty Medication(s) to be Shipped:   Neurology: Gary Rogers    Other medication(s) to be shipped: No additional medications requested for fill at this time     Gary Rogers, DOB: 1955-05-23  Phone: [phone] (home)       All above HIPAA information was verified with patient.     Was a Nurse, learning disability used for this call? No    Completed refill call assessment today to schedule patient's medication shipment from the [facility] [phone]).  All relevant notes have been reviewed.     Specialty medication(s) and dose(s) confirmed: Regimen is correct and unchanged.   Changes to medications: Gary Rogers reports no changes at this time.  Changes to insurance: No  New side effects reported not previously addressed with a pharmacist or physician: None reported  Questions for the pharmacist: No    Confirmed patient received a Conservation officer, historic buildings and a Surveyor, mining with first shipment. The patient will receive a drug information handout for each medication shipped and additional FDA Medication Guides as required.       DISEASE/MEDICATION-SPECIFIC INFORMATION        N/A    SPECIALTY MEDICATION ADHERENCE     Medication Adherence    Patient reported X missed doses in the last month: 0  Specialty Medication: AUSTEDO 6 mg Tab  Patient is on additional specialty medications: No  Patient is on more than two specialty medications: No  Any gaps in refill history greater than 2 weeks in the last 3 months: no  Demonstrates understanding of importance of adherence: yes              Were doses missed due to medication being on hold? No    Austedo 6 mg: 14  days of medicine on hand       REFERRAL TO PHARMACIST     Referral to the pharmacist: Not needed      [facility]     Shipping address confirmed in Epic.     Delivery Scheduled: Yes, Expected medication delivery date: 11/16/22 .  However, Rx request for refills was sent to the provider as there are none remaining.     Medication will be delivered via UPS to the prescription address in Epic WAM.    Gary Rogers   [facility] Specialty Technician

## 2022-11-12 MED ORDER — AUSTEDO 6 MG TABLET
ORAL_TABLET | Freq: Two times a day (BID) | ORAL | 6 refills | 30 days | Status: CP
Start: 2022-11-12 — End: ?
  Filled 2022-11-15: qty 60, 30d supply, fill #0

## 2022-11-12 NOTE — Unmapped (Signed)
Refill request received from patient.      Medication Requested: Austedo 6 mg   Last Office Visit: 07/26/2022   Next Office Visit: 01/28/2023  Last Prescriber: Dr Raenette Rover     Nurse refill requirements met? Yes  If not met, why:     Sent to: Pharmacy per protocol  If sent to provider, which provider?:

## 2022-11-14 ENCOUNTER — Ambulatory Visit (HOSPITAL_COMMUNITY)
Admission: RE | Admit: 2022-11-14 | Discharge: 2022-11-14 | Disposition: A | Discharge status: Home / Self Care | Payer: Medicare Other | Source: Ambulatory Visit | Attending: Nephrology | Admitting: Nephrology

## 2022-11-14 VITALS — BP 153/75 | HR 63 | Temp 97.8°F | Resp 20

## 2022-11-14 DIAGNOSIS — N183 Chronic kidney disease, stage 3 unspecified: Secondary | ICD-10-CM | POA: Diagnosis not present

## 2022-11-14 LAB — RENAL FUNCTION PANEL
Albumin: 3.7 g/dL (ref 3.5–5.0)
Anion gap: 10 (ref 5–15)
BUN: 126 mg/dL — ABNORMAL HIGH (ref 8–23)
CO2: 22 mmol/L (ref 22–32)
Calcium: 7.9 mg/dL — ABNORMAL LOW (ref 8.9–10.3)
Chloride: 109 mmol/L (ref 98–111)
Creatinine, Ser: 3.84 mg/dL — ABNORMAL HIGH (ref 0.61–1.24)
GFR, Estimated: 16 mL/min — ABNORMAL LOW (ref >60)
Glucose, Bld: 73 mg/dL (ref 70–99)
Phosphorus: 6.9 mg/dL — ABNORMAL HIGH (ref 2.5–4.6)
Potassium: 4.1 mmol/L (ref 3.5–5.1)
Sodium: 141 mmol/L (ref 135–145)

## 2022-11-14 LAB — IRON AND TIBC
Iron: 71 ug/dL (ref 45–182)
Saturation Ratios: 22 % (ref 17.9–39.5)
TIBC: 319 ug/dL (ref 250–450)
UIBC: 248 ug/dL

## 2022-11-14 LAB — CBC WITH DIFFERENTIAL/PLATELET
Abs Immature Granulocytes: 0.11 10*3/uL — ABNORMAL HIGH (ref 0.00–0.07)
Basophils Absolute: 0 10*3/uL (ref 0.0–0.1)
Basophils Relative: 0 %
Eosinophils Absolute: 0.1 10*3/uL (ref 0.0–0.5)
Eosinophils Relative: 1 %
HCT: 37.7 % — ABNORMAL LOW (ref 39.0–52.0)
Hemoglobin: 11.7 g/dL — ABNORMAL LOW (ref 13.0–17.0)
Immature Granulocytes: 2 %
Lymphocytes Relative: 42 %
Lymphs Abs: 3.1 10*3/uL (ref 0.7–4.0)
MCH: 27.7 pg (ref 26.0–34.0)
MCHC: 31 g/dL (ref 30.0–36.0)
MCV: 89.1 fL (ref 80.0–100.0)
Monocytes Absolute: 0.7 10*3/uL (ref 0.1–1.0)
Monocytes Relative: 9 %
Neutro Abs: 3.4 10*3/uL (ref 1.7–7.7)
Neutrophils Relative %: 46 %
Platelets: 287 10*3/uL (ref 150–400)
RBC: 4.23 MIL/uL (ref 4.22–5.81)
RDW: 14.6 % (ref 11.5–15.5)
WBC: 7.4 10*3/uL (ref 4.0–10.5)
nRBC: 0 % (ref 0.0–0.2)

## 2022-11-14 LAB — FERRITIN: Ferritin: 79 ng/mL (ref 24–336)

## 2022-11-14 LAB — POCT HEMOGLOBIN-HEMACUE: Hemoglobin: 12.1 g/dL — ABNORMAL LOW (ref 13.0–17.0)

## 2022-11-14 MED ORDER — EPOETIN ALFA 40000 UNIT/ML IJ SOLN
INTRAMUSCULAR | Status: AC
  Filled 2022-11-14: qty 1

## 2022-11-14 MED ORDER — EPOETIN ALFA 40000 UNIT/ML IJ SOLN: 40000.0000 [IU] | INTRAMUSCULAR | Status: DC

## 2022-11-28 ENCOUNTER — Encounter (HOSPITAL_COMMUNITY): Payer: Medicare Other

## 2022-11-29 ENCOUNTER — Ambulatory Visit (HOSPITAL_COMMUNITY)
Admission: RE | Admit: 2022-11-29 | Discharge: 2022-11-29 | Disposition: A | Discharge status: Home / Self Care | Payer: Medicare Other | Source: Ambulatory Visit | Attending: Nephrology | Admitting: Nephrology

## 2022-11-29 VITALS — BP 137/64 | HR 85 | Temp 97.7°F | Resp 20

## 2022-11-29 DIAGNOSIS — D631 Anemia in chronic kidney disease: Secondary | ICD-10-CM | POA: Diagnosis not present

## 2022-11-29 DIAGNOSIS — N183 Chronic kidney disease, stage 3 unspecified: Secondary | ICD-10-CM | POA: Diagnosis not present

## 2022-11-29 LAB — POCT HEMOGLOBIN-HEMACUE: Hemoglobin: 10.6 g/dL — ABNORMAL LOW (ref 13.0–17.0)

## 2022-11-29 MED ORDER — EPOETIN ALFA 40000 UNIT/ML IJ SOLN
INTRAMUSCULAR | Status: AC
  Administered 2022-11-29: 40000 [IU] via SUBCUTANEOUS
  Filled 2022-11-29: qty 1

## 2022-11-29 MED ORDER — EPOETIN ALFA 40000 UNIT/ML IJ SOLN: 40000.0000 [IU] | INTRAMUSCULAR | Status: DC

## 2022-12-07 NOTE — Unmapped (Signed)
[facility] Refill Coordination Note    Specialty Medication(s) to be Shipped:   Neurology: Gary Rogers    Other medication(s) to be shipped: No additional medications requested for fill at this time     Gary Rogers, DOB: 06/10/1955  Phone: [phone] (home)       All above HIPAA information was verified with patient.     Was a Nurse, learning disability used for this call? No    Completed refill call assessment today to schedule patient's medication shipment from the [facility] [phone]).  All relevant notes have been reviewed.     Specialty medication(s) and dose(s) confirmed: Regimen is correct and unchanged.   Changes to medications: Gary Rogers reports no changes at this time.  Changes to insurance: No  New side effects reported not previously addressed with a pharmacist or physician: None reported  Questions for the pharmacist: No    Confirmed patient received a Conservation officer, historic buildings and a Surveyor, mining with first shipment. The patient will receive a drug information handout for each medication shipped and additional FDA Medication Guides as required.       DISEASE/MEDICATION-SPECIFIC INFORMATION        N/A    SPECIALTY MEDICATION ADHERENCE     Medication Adherence    Patient reported X missed doses in the last month: 0  Specialty Medication: AUSTEDO 6 mg Tab (deutetrabenazine)  Patient is on additional specialty medications: No  Patient is on more than two specialty medications: No  Any gaps in refill history greater than 2 weeks in the last 3 months: no  Demonstrates understanding of importance of adherence: yes  Informant: patient  Reliability of informant: reliable  Provider-estimated medication adherence level: good  Patient is at risk for Non-Adherence: No  Reasons for non-adherence: no problems identified              Were doses missed due to medication being on hold? No    AUSTEDO 6 mg Tab (deutetrabenazine)  : 14 days of medicine on hand       REFERRAL TO PHARMACIST Referral to the pharmacist: Not needed      [facility]     Shipping address confirmed in Epic.       Delivery Scheduled: Yes, Expected medication delivery date: 12/18/22.     Medication will be delivered via UPS to the prescription address in Epic WAM.    Gary Rogers' [facility] Specialty Technician

## 2022-12-10 DIAGNOSIS — Z7682 Awaiting organ transplant status: Secondary | ICD-10-CM | POA: Diagnosis not present

## 2022-12-12 ENCOUNTER — Encounter (HOSPITAL_COMMUNITY): Payer: Medicare Other

## 2022-12-13 DIAGNOSIS — B351 Tinea unguium: Secondary | ICD-10-CM | POA: Diagnosis not present

## 2022-12-13 DIAGNOSIS — E1142 Type 2 diabetes mellitus with diabetic polyneuropathy: Secondary | ICD-10-CM | POA: Diagnosis not present

## 2022-12-17 MED FILL — AUSTEDO 6 MG TABLET: ORAL | 30 days supply | Qty: 60 | Fill #1

## 2022-12-21 DIAGNOSIS — N185 Chronic kidney disease, stage 5: Secondary | ICD-10-CM | POA: Diagnosis not present

## 2022-12-21 DIAGNOSIS — Z79899 Other long term (current) drug therapy: Secondary | ICD-10-CM | POA: Diagnosis not present

## 2022-12-21 DIAGNOSIS — D509 Iron deficiency anemia, unspecified: Secondary | ICD-10-CM | POA: Diagnosis not present

## 2022-12-21 DIAGNOSIS — M25561 Pain in right knee: Secondary | ICD-10-CM | POA: Diagnosis not present

## 2022-12-21 DIAGNOSIS — W19XXXD Unspecified fall, subsequent encounter: Secondary | ICD-10-CM | POA: Diagnosis not present

## 2022-12-21 DIAGNOSIS — W108XXA Fall (on) (from) other stairs and steps, initial encounter: Secondary | ICD-10-CM | POA: Diagnosis not present

## 2022-12-21 DIAGNOSIS — Z23 Encounter for immunization: Secondary | ICD-10-CM | POA: Diagnosis not present

## 2022-12-26 ENCOUNTER — Ambulatory Visit (HOSPITAL_COMMUNITY)
Admission: RE | Admit: 2022-12-26 | Discharge: 2022-12-26 | Disposition: A | Discharge status: Home / Self Care | Payer: Medicare Other | Source: Ambulatory Visit | Attending: Nephrology | Admitting: Nephrology

## 2022-12-26 VITALS — BP 159/75 | HR 66 | Temp 97.4°F | Resp 18

## 2022-12-26 DIAGNOSIS — N183 Chronic kidney disease, stage 3 unspecified: Secondary | ICD-10-CM | POA: Diagnosis not present

## 2022-12-26 DIAGNOSIS — D631 Anemia in chronic kidney disease: Secondary | ICD-10-CM | POA: Diagnosis not present

## 2022-12-26 LAB — CBC WITH DIFFERENTIAL/PLATELET
Abs Immature Granulocytes: 0.09 10*3/uL — ABNORMAL HIGH (ref 0.00–0.07)
Basophils Absolute: 0 10*3/uL (ref 0.0–0.1)
Basophils Relative: 1 %
Eosinophils Absolute: 0.1 10*3/uL (ref 0.0–0.5)
Eosinophils Relative: 2 %
HCT: 39 % (ref 39.0–52.0)
Hemoglobin: 12.1 g/dL — ABNORMAL LOW (ref 13.0–17.0)
Immature Granulocytes: 1 %
Lymphocytes Relative: 42 %
Lymphs Abs: 2.9 10*3/uL (ref 0.7–4.0)
MCH: 27.7 pg (ref 26.0–34.0)
MCHC: 31 g/dL (ref 30.0–36.0)
MCV: 89.2 fL (ref 80.0–100.0)
Monocytes Absolute: 0.7 10*3/uL (ref 0.1–1.0)
Monocytes Relative: 10 %
Neutro Abs: 3 10*3/uL (ref 1.7–7.7)
Neutrophils Relative %: 44 %
Platelets: 308 10*3/uL (ref 150–400)
RBC: 4.37 MIL/uL (ref 4.22–5.81)
RDW: 14.8 % (ref 11.5–15.5)
WBC: 6.8 10*3/uL (ref 4.0–10.5)
nRBC: 0 % (ref 0.0–0.2)

## 2022-12-26 LAB — FERRITIN: Ferritin: 99 ng/mL (ref 24–336)

## 2022-12-26 LAB — POCT HEMOGLOBIN-HEMACUE: Hemoglobin: 11.9 g/dL — ABNORMAL LOW (ref 13.0–17.0)

## 2022-12-26 LAB — IRON AND TIBC
Iron: 60 ug/dL (ref 45–182)
Saturation Ratios: 17 % — ABNORMAL LOW (ref 17.9–39.5)
TIBC: 360 ug/dL (ref 250–450)
UIBC: 300 ug/dL

## 2022-12-26 LAB — RENAL FUNCTION PANEL
Albumin: 4 g/dL (ref 3.5–5.0)
Anion gap: 13 (ref 5–15)
BUN: 144 mg/dL — ABNORMAL HIGH (ref 8–23)
CO2: 19 mmol/L — ABNORMAL LOW (ref 22–32)
Calcium: 8.6 mg/dL — ABNORMAL LOW (ref 8.9–10.3)
Chloride: 108 mmol/L (ref 98–111)
Creatinine, Ser: 5.09 mg/dL — ABNORMAL HIGH (ref 0.61–1.24)
GFR, Estimated: 12 mL/min — ABNORMAL LOW (ref >60)
Glucose, Bld: 56 mg/dL — ABNORMAL LOW (ref 70–99)
Phosphorus: 7.1 mg/dL — ABNORMAL HIGH (ref 2.5–4.6)
Potassium: 4 mmol/L (ref 3.5–5.1)
Sodium: 140 mmol/L (ref 135–145)

## 2022-12-26 MED ORDER — EPOETIN ALFA 40000 UNIT/ML IJ SOLN
40000.0000 [IU] | INTRAMUSCULAR | Status: DC
  Administered 2022-12-26: 40000 [IU] via SUBCUTANEOUS

## 2022-12-26 MED ORDER — EPOETIN ALFA 40000 UNIT/ML IJ SOLN
INTRAMUSCULAR | Status: AC
  Filled 2022-12-26: qty 1

## 2022-12-29 DIAGNOSIS — M25551 Pain in right hip: Secondary | ICD-10-CM | POA: Diagnosis not present

## 2022-12-29 DIAGNOSIS — M1611 Unilateral primary osteoarthritis, right hip: Secondary | ICD-10-CM | POA: Diagnosis not present

## 2022-12-29 DIAGNOSIS — M1711 Unilateral primary osteoarthritis, right knee: Secondary | ICD-10-CM | POA: Diagnosis not present

## 2022-12-29 DIAGNOSIS — W1830XA Fall on same level, unspecified, initial encounter: Secondary | ICD-10-CM | POA: Diagnosis not present

## 2022-12-29 DIAGNOSIS — M25561 Pain in right knee: Secondary | ICD-10-CM | POA: Diagnosis not present

## 2023-01-16 NOTE — Unmapped (Signed)
Patient was notified of operational disruptions. Patient opted to: schedule their refill with understanding of potential delay until 10/1 or later. This was facilitated by pharmacy staff     [facility] Refill Coordination Note    Specialty Medication(s) to be Shipped:   Neurology: Austedo 6mg     Other medication(s) to be shipped: No additional medications requested for fill at this time     Gary Rogers, DOB: May 14, 1955  Phone: [phone] (home)       All above HIPAA information was verified with patient.     Was a Nurse, learning disability used for this call? No    Completed refill call assessment today to schedule patient's medication shipment from the [facility]  [phone]).  All relevant notes have been reviewed.     Specialty medication(s) and dose(s) confirmed: Regimen is correct and unchanged.   Changes to medications: Gary Rogers reports no changes at this time.  Changes to insurance: No  New side effects reported not previously addressed with a pharmacist or physician: None reported  Questions for the pharmacist: No    Confirmed patient received a Conservation officer, historic buildings and a Surveyor, mining with first shipment. The patient will receive a drug information handout for each medication shipped and additional FDA Medication Guides as required.       DISEASE/MEDICATION-SPECIFIC INFORMATION        N/A    SPECIALTY MEDICATION ADHERENCE     Medication Adherence    Patient reported X missed doses in the last month: 0  Specialty Medication: AUSTEDO 6 mg Tab (deutetrabenazine)  Patient is on additional specialty medications: No  Informant: patient              Were doses missed due to medication being on hold? No      Austedo 6 mg: 5 days of medicine on hand       REFERRAL TO PHARMACIST     Referral to the pharmacist: Not needed      [facility]     Shipping address confirmed in Epic.       Delivery Scheduled: Yes, Expected medication delivery date: 01/22/23. Medication will be delivered via UPS to the prescription address in Epic WAM.    Gary Rogers   [facility]  Specialty Technician

## 2023-01-17 DIAGNOSIS — Z940 Kidney transplant status: Secondary | ICD-10-CM | POA: Diagnosis not present

## 2023-01-17 DIAGNOSIS — N189 Chronic kidney disease, unspecified: Secondary | ICD-10-CM | POA: Diagnosis not present

## 2023-01-21 DIAGNOSIS — D631 Anemia in chronic kidney disease: Secondary | ICD-10-CM | POA: Diagnosis not present

## 2023-01-21 DIAGNOSIS — N179 Acute kidney failure, unspecified: Secondary | ICD-10-CM | POA: Diagnosis not present

## 2023-01-21 DIAGNOSIS — N185 Chronic kidney disease, stage 5: Secondary | ICD-10-CM | POA: Diagnosis not present

## 2023-01-21 DIAGNOSIS — E1122 Type 2 diabetes mellitus with diabetic chronic kidney disease: Secondary | ICD-10-CM | POA: Diagnosis not present

## 2023-01-21 DIAGNOSIS — E875 Hyperkalemia: Secondary | ICD-10-CM | POA: Diagnosis not present

## 2023-01-21 DIAGNOSIS — N2581 Secondary hyperparathyroidism of renal origin: Secondary | ICD-10-CM | POA: Diagnosis not present

## 2023-01-21 DIAGNOSIS — Z79899 Other long term (current) drug therapy: Secondary | ICD-10-CM | POA: Diagnosis not present

## 2023-01-21 DIAGNOSIS — I120 Hypertensive chronic kidney disease with stage 5 chronic kidney disease or end stage renal disease: Secondary | ICD-10-CM | POA: Diagnosis not present

## 2023-01-21 DIAGNOSIS — I770 Arteriovenous fistula, acquired: Secondary | ICD-10-CM | POA: Diagnosis not present

## 2023-01-21 DIAGNOSIS — E872 Acidosis, unspecified: Secondary | ICD-10-CM | POA: Diagnosis not present

## 2023-01-21 MED FILL — AUSTEDO 6 MG TABLET: ORAL | 30 days supply | Qty: 60 | Fill #2

## 2023-01-23 ENCOUNTER — Encounter (HOSPITAL_COMMUNITY)
Admission: RE | Admit: 2023-01-23 | Discharge: 2023-01-23 | Disposition: A | Discharge status: Home / Self Care | Payer: Medicare Other | Source: Ambulatory Visit | Attending: Nephrology | Admitting: Nephrology

## 2023-01-23 VITALS — BP 167/80 | HR 69 | Temp 97.2°F | Resp 17

## 2023-01-23 DIAGNOSIS — N183 Chronic kidney disease, stage 3 unspecified: Secondary | ICD-10-CM | POA: Insufficient documentation

## 2023-01-23 DIAGNOSIS — D631 Anemia in chronic kidney disease: Secondary | ICD-10-CM | POA: Insufficient documentation

## 2023-01-23 LAB — CBC WITH DIFFERENTIAL/PLATELET
Abs Immature Granulocytes: 0.06 10*3/uL (ref 0.00–0.07)
Basophils Absolute: 0.1 10*3/uL (ref 0.0–0.1)
Basophils Relative: 1 %
Eosinophils Absolute: 0.1 10*3/uL (ref 0.0–0.5)
Eosinophils Relative: 1 %
HCT: 37.9 % — ABNORMAL LOW (ref 39.0–52.0)
Hemoglobin: 11.8 g/dL — ABNORMAL LOW (ref 13.0–17.0)
Immature Granulocytes: 1 %
Lymphocytes Relative: 37 %
Lymphs Abs: 2.5 10*3/uL (ref 0.7–4.0)
MCH: 27.9 pg (ref 26.0–34.0)
MCHC: 31.1 g/dL (ref 30.0–36.0)
MCV: 89.6 fL (ref 80.0–100.0)
Monocytes Absolute: 0.6 10*3/uL (ref 0.1–1.0)
Monocytes Relative: 9 %
Neutro Abs: 3.5 10*3/uL (ref 1.7–7.7)
Neutrophils Relative %: 51 %
Platelets: 339 10*3/uL (ref 150–400)
RBC: 4.23 MIL/uL (ref 4.22–5.81)
RDW: 14.3 % (ref 11.5–15.5)
WBC: 6.8 10*3/uL (ref 4.0–10.5)
nRBC: 0 % (ref 0.0–0.2)

## 2023-01-23 LAB — RENAL FUNCTION PANEL
Albumin: 3.7 g/dL (ref 3.5–5.0)
Anion gap: 12 (ref 5–15)
BUN: 129 mg/dL — ABNORMAL HIGH (ref 8–23)
CO2: 21 mmol/L — ABNORMAL LOW (ref 22–32)
Calcium: 8.7 mg/dL — ABNORMAL LOW (ref 8.9–10.3)
Chloride: 106 mmol/L (ref 98–111)
Creatinine, Ser: 3.91 mg/dL — ABNORMAL HIGH (ref 0.61–1.24)
GFR, Estimated: 16 mL/min — ABNORMAL LOW (ref >60)
Glucose, Bld: 105 mg/dL — ABNORMAL HIGH (ref 70–99)
Phosphorus: 6 mg/dL — ABNORMAL HIGH (ref 2.5–4.6)
Potassium: 3.9 mmol/L (ref 3.5–5.1)
Sodium: 139 mmol/L (ref 135–145)

## 2023-01-23 LAB — POCT HEMOGLOBIN-HEMACUE: Hemoglobin: 11.7 g/dL — ABNORMAL LOW (ref 13.0–17.0)

## 2023-01-23 MED ORDER — EPOETIN ALFA 40000 UNIT/ML IJ SOLN
40000.0000 [IU] | INTRAMUSCULAR | Status: DC
  Administered 2023-01-23: 40000 [IU] via SUBCUTANEOUS

## 2023-01-23 MED ORDER — EPOETIN ALFA 40000 UNIT/ML IJ SOLN
INTRAMUSCULAR | Status: AC
  Filled 2023-01-23: qty 1

## 2023-01-28 ENCOUNTER — Ambulatory Visit: Admit: 2023-01-28 | Discharge: 2023-01-29 | Payer: MEDICARE | Attending: Family | Primary: Family

## 2023-01-28 DIAGNOSIS — R293 Abnormal posture: Secondary | ICD-10-CM | POA: Diagnosis not present

## 2023-01-28 DIAGNOSIS — G255 Other chorea: Secondary | ICD-10-CM | POA: Diagnosis not present

## 2023-01-28 DIAGNOSIS — Z9181 History of falling: Secondary | ICD-10-CM | POA: Diagnosis not present

## 2023-01-28 NOTE — Unmapped (Signed)
FAX CONFIRMATION     Orders/documents that were faxed: PT Referral  Facility/location it was faxed to: [facility]  Phone:[phone]  Fax:[phone]  Time sent:1001  Fax confirmation received and document shredded

## 2023-01-28 NOTE — Unmapped (Addendum)
[address] [address]  [facility] [address]   [address]  [place] [address]  [phone]    Date: 01/28/2023  Patient Name: Gary Rogers  MRN: [id number]  PCP: Jack Quarto, MD    Assessment:      [name] is a 67 y.o. male who  has a past medical history of Anxiety, Chorea, Chronic kidney disease, Depression, Diabetes mellitus, type 2 (CMS-HCC), Hyperlipidemia, Hypertension, and Hyperthyroidism. presenting in follow up for evaluation of mostly generalized, asymmetric, adventitious movements sparing the upper face (left greater than right), historically beginning in a rather acute/subacute fashion in 2009 when he apparently had very poorly controlled blood sugars and development/appreciation of chronic kidney disease requiring renal transplant, with subsequent improvement of his chorea both subjectively and apparently objectively through follow-up clinic notes from the [facility] [place] [facility] [place] movement disorder group initially after performing renal transplant, subsequently with introduction of neuroleptics and most recently dopamine depletors, likely etiology of 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    1. 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 He develops parakinesias during examination, with a nonstereoptyped movement that flows from joint to joint, matching with suspected choreiform disorder. Some of his speech difficulties could be apraxic in nature versus ataxic in nature should he have had any involvement of his brainstem/cerebellum with the underlying toxic/metabolic process, but may just as well be related to lower facial chorea. I might argue the latter case after seeing his lower facial dyskinesias more vividly in the past off of therapy.  Patient historically has benefited from tetrabenazine with more evident EPS symptoms that were even bothering the patient for which reason we discontinued therapy.  Deutetrabenazine has been well-tolerated with what appear to be minimal EPS symptoms which are not bothering the patient, and overall feels as though his choreiform movement is well controlled.    Since last visit patient continues to manage chronic health issues and is awaiting approval for potential kidney transplant. His TSH is being managed by primary care.  We will continue current dose of Austedo unchanged as he feels there is not currently a need to adjust medications as his choreiform movement is well controlled. I have referred the patient to physical therapy to work on balance as currently I am concerned he will and cause injury to himself.    I personally spent 22 minutes face-to-face and non-face-to-face in the care of this patient, which includes all pre, intra, and post visit time on the date of service.  All documented time was specific to the E/M visit and does not include any procedures that may have been performed.    Follow-up 6 months.    Scribe's Attestation: Cathlean Cower, NP obtained and performed the history, physical exam and medical decision making elements that were entered into the chart. Signed by Jola Schmidt, Scribe, on January 28, 2023 at 9:51 AM.    ----------------------------------------------------------------------------------------------------------------------  January 28, 2023 3:55 PM. Documentation assistance provided by the Scribe. I was present during the time the encounter was recorded. The information recorded by the Scribe  was done at my direction and has been reviewed and validated by me.    Diannia Ruder, FNP  ----------------------------------------------------------------------------------------------------------------------       Plan:      Patient Instructions   Continue as you are with the austedo.  Follow up 6 months.       Subjective:      HPI: Patient is a 67 y.o. left-handed Caucasian male with past medical history of chronic kidney disease status post renal transplant in 2010, diabetes mellitus previously quite poorly controlled, who presents for follow up of adventitious movements that have been present throughout the body since approximately 2009. These were felt to be toxic-metabolic induced choreaform movements. Please review my initial clinic visit note dated on 09/29.2016 for more information regarding history of present illness.    Interim history: Patient presents today by himself. He is waiting to undergo a kidney transplant.     He thinks his chorea is doing pretty good and is not interfering with quality of life.     He has had a couple of falls, the last being August 31st. He was leaving work and tripped in the parking lot. 3-4 months before he fell down the front steps at home.     Dose of Austedo has not changed (1 6mg  tablet twice a day).    Denies swallowing concerns, being overly slow, overly stiff.     He notes that diabetes is under control.     Endorses some changes in memory, but noting too worrisome.     Endorses some irritable bowels.     CHOREA/DYSKINESIA REGIMEN:  Austedo 6 mg 1 tablet twice a day    On review of systems, the patient denies hallucinations, impulsivity, irritability, current memory deficits.  Denies double vision. Denies loss of consciousness episodes. Denies urinary urgency or frequency/hesitancy of urination.    Past Medical Hx:    Past Medical History:   Diagnosis Date    Anxiety     Chorea     Chronic kidney disease Depression     Diabetes mellitus, type 2 (CMS-HCC)     Hyperlipidemia     Hypertension     Hyperthyroidism        Past Surgical Hx:    Past Surgical History:   Procedure Laterality Date    NEPHRECTOMY TRANSPLANTED ORGAN Bilateral 2010       Social Hx:    Social History     Socioeconomic History    Marital status: Widowed     Spouse name: None    Number of children: None    Years of education: None    Highest education level: None   Tobacco Use    Smoking status: Never     Passive exposure: Never    Smokeless tobacco: Never   Substance and Sexual Activity    Alcohol use: Not Currently    Drug use: No    Sexual activity: Not Currently   Social History Narrative    1.  Retired Naval architect    2.  Lives alone, but nearby his mother in [place].    3.  Patient is a widower    4.  Denies any known exposure to toxic substances such as industrial pesticides or radiation.     Social Determinants of Health     Food Insecurity: No Food Insecurity (03/09/2022)    Received from [facility], [facility]    Hunger Vital Sign     Worried About Running Out of  Food in the Last Year: Never true     Ran Out of Food in the Last Year: Never true   Transportation Needs: No Transportation Needs (03/09/2022)    Received from [facility], [facility]    [facility] - Transportation     Lack of Transportation (Medical): No     Lack of Transportation (Non-Medical): No    Received from [employer], [facility]    Social Network       Family Hx:    Family History   Problem Relation Age of Onset    Thyroid disease Mother     Brain cancer Father     Hearing loss Father     No Known Problems Sister     Diabetes Maternal Grandfather     No Known Problems Sister     Hepatitis Son         Hepatitis C    No Known Problems Daughter        ALLERGIES:  No Known Allergies    CURRENT MEDICATIONS:    Current Outpatient Medications   Medication Sig Dispense Refill    amlodipine (NORVASC) 10 MG tablet Take 1 tablet (10 mg total) by mouth daily. atorvastatin (LIPITOR) 10 MG tablet Take 1 tablet (10 mg total) by mouth in the morning.      CAPTOPRIL ORAL Take 6.25 mg by mouth two (2) times a day.      carvedilol (COREG) 6.25 MG tablet Take 1 tablet (6.25 mg total) by mouth two (2) times a day.      deutetrabenazine (AUSTEDO) 6 mg Tab Take 1 tablet (6 mg total) by mouth two (2) times a day. 60 tablet 6    DEXCOM G7 SENSOR Devi Use as directed.      furosemide (LASIX) 80 MG tablet 1 (ONE) TABLET IN THE MORNING, 1/2 TAB (40MG ) IN THE EVENING      hydrALAZINE (APRESOLINE) 25 MG tablet       insulin degludec 200 unit/mL (3 mL) InPn Inject under the skin daily.       levothyroxine (SYNTHROID) 125 MCG tablet Take 1 tablet (125 mcg total) by mouth daily.      mycophenolate (CELLCEPT) 500 mg tablet Take by mouth two (2) times a day.      mycophenolate (MYFORTIC) 180 MG EC tablet Take 1 tablet (180 mg total) by mouth two (2) times a day.      NOVOLOG U-100 INSULIN ASPART 100 unit/mL vial INJECT UP TO 200 UNITS INTO OMNIPOD INSULIN PUMP EVERY 3 DAYS.      OMNIPOD DASH PODS, GEN 4, Crtg CHANGE POD EVERY 72 HOURS      predniSONE (DELTASONE) 5 MG tablet Take 1 tablet (5 mg total) by mouth daily.      sodium bicarbonate 650 mg tablet Take 1 tablet (650 mg total) by mouth two (2) times a day.      tacrolimus (PROGRAF) 0.5 MG capsule Take 3 capsules (1.5 mg total) by mouth two (2) times a day. TAKE 4 CAPSULES BY MOUTH TWICE DAILY      glucagon (GVOKE HYPOPEN 1-PACK) 1 mg/0.2 mL AtIn Keep on hand      LOKELMA 10 gram PwPk packet 1 (ONE) PACKET BY MOUTH THREE TIMES A WEEK (Patient not taking: Reported on 01/28/2023)      oxyCODONE (ROXICODONE) 5 MG immediate release tablet Take 1 tablet (5 mg total) by mouth every eight (8) hours as needed. (Patient not taking: Reported on 01/28/2023)  No current facility-administered medications for this visit.        Objective:      Physical Exam:  Vitals:    01/28/23 0915 01/28/23 0919   Orthostatic BP: 134/68 117/59   Orthostatic Pulse: 62 63   BP Site: L Arm L Arm   BP Position: Sitting Standing   BP Cuff Size: Medium Medium          Neurological Examination:   MS: AAOx3, naming/fluency/repetition intact. Following lateralizing commands across midline     Cranial nerves: PERRL, approx. 4.5 to 3.75mm brisk bilaterally. EOMI except for inability to bury sclera on bilateral, lateral gaze, (abducting eye only (even with monocular testing)). No end gaze horizontal nystagmus appreciated, no interrupting saccades.  No evidence today for hypometric nor hypermetric saccades.  VFF to confrontation. No facial asymmetry. Tongue protrudes midline.  V1-3 intact to light touch and pinprick. SCM and trapezius 5/5 bilaterally.    Motor: symmetric bulk throughout. No fasciculations.     MOVEMENT DISORDERS EXAMINATION:  Facial expression was normal . Volume of speech was normal . Handwriting was not assessed. There was no evidence of rest tremor. There was no evidence of postural tremor. There was mild  action tremor in bil upper extremities. Tone was normal in the neck and tone was normal in the right upper extremity, right lower extremity and left lower extremity, but there was increased tone in the left upper extremity today (influenced by paratonias).  Rapid alternating movements were mildly decreased in amplitude and speed with mild breakdown in the left upper extremity and left lower extremity.   He could get up from a low lying chair without help of both hands..  Gait was steady with good heel to toe stride without alternating length of stride.  Arm swing was normal bilaterally with minimal parakinesias.     In addition, there were evident choreiform movements of the upper extremities, right upper extremity mildly worse than left upper extremity again, not present in the trunk; present in lower limbs only while seated today, not while ambulating (right worse than left). Moderate orobuccolingual movements appreciated without upper facial involvement.  Please see AIMS score below mostly for monitoring purposes.    Coordination: slight overshoot with finger chase.     Testing:      AIMS TARDIVE DYSKINESIA SCALE    Facial and Oral Movements  1. Muscles of facial expression (e.g. movements of forehead, eyebrows, periorbital area, cheeks. Include frowning, blinking, grimacing of upper face)   2 = mild    2. Lips and perioral area (e.g. puckering, pouting, smacking)  3 = moderate    3. Jaw (biting, clenching, chewing, mouth opening, lateral movement)  2 = mild    4. Tongue (rate only increase in movement both in and out of mouth, not inability to sustain movement)  1 = minimal (may be extreme normal)    Extremity Movements   5. Upper (arms, wrists, hands, fingers; include movements that are choreic or athetoid.  Do not include tremor)            2 = mild     6. Lower (legs, knees, ankles, toes; e.g. lateral knee movement, foot tapping, heel dropping, foot squirming, inversion and eversion of foot)            3 = moderate    Trunk Movements   7.  Neck, shoulders, hips (rocking, twisting, squirming, pelvic gyrations.  Include diaphragmatic movements)   2 = mild  Global Judgements  8.  Severity of abnormal movements          2 = mild    Based on the highest single score on the above items  9.  Incapacitation due to abnormal movements  1 = minimal    10.  Patient's awareness of abnormal movements  1 = aware, no distress    11.  Current problems with teeth and/or dentures  0 = no    12.  Does patient usually wear dentures?  0 = no  ______________________________________________________________________  TOTAL: 19

## 2023-01-28 NOTE — Unmapped (Signed)
Continue as you are with the austedo.  Follow up 6 months.

## 2023-02-04 DIAGNOSIS — B029 Zoster without complications: Secondary | ICD-10-CM | POA: Diagnosis not present

## 2023-02-14 NOTE — Unmapped (Signed)
[facility] Refill Coordination Note    Specialty Medication(s) to be Shipped:   Neurology: Gary Rogers    Other medication(s) to be shipped: No additional medications requested for fill at this time     Gary Rogers, DOB: Mar 27, 1956  Phone: [phone] (home)       All above HIPAA information was verified with patient.     Was a Nurse, learning disability used for this call? No    Completed refill call assessment today to schedule patient's medication shipment from the [facility]  [phone]).  All relevant notes have been reviewed.     Specialty medication(s) and dose(s) confirmed: Regimen is correct and unchanged.   Changes to medications: Gary Rogers reports no changes at this time.  Changes to insurance: No  New side effects reported not previously addressed with a pharmacist or physician: None reported  Questions for the pharmacist: No    Confirmed patient received a Conservation officer, historic buildings and a Surveyor, mining with first shipment. The patient will receive a drug information handout for each medication shipped and additional FDA Medication Guides as required.       DISEASE/MEDICATION-SPECIFIC INFORMATION        N/A    SPECIALTY MEDICATION ADHERENCE     Medication Adherence    Patient reported X missed doses in the last month: 1  Specialty Medication: AUSTEDO 6 mg Tab  Patient is on additional specialty medications: No  Patient is on more than two specialty medications: No  Any gaps in refill history greater than 2 weeks in the last 3 months: no  Demonstrates understanding of importance of adherence: yes              Were doses missed due to medication being on hold? No    AUSTEDO 6   mg: 8 days of medicine on hand       REFERRAL TO PHARMACIST     Referral to the pharmacist: Not needed      [facility]     Shipping address confirmed in Epic.       Delivery Scheduled: Yes, Expected medication delivery date: 02/19/23.     Medication will be delivered via UPS to the prescription address in Epic WAM.    Gary Rogers   [facility]  Specialty Technician

## 2023-02-18 MED FILL — AUSTEDO 6 MG TABLET: ORAL | 30 days supply | Qty: 60 | Fill #3

## 2023-02-20 ENCOUNTER — Encounter (HOSPITAL_COMMUNITY)
Admission: RE | Admit: 2023-02-20 | Discharge: 2023-02-20 | Disposition: A | Discharge status: Home / Self Care | Payer: Medicare Other | Source: Ambulatory Visit | Attending: Nephrology | Admitting: Nephrology

## 2023-02-20 VITALS — BP 147/68 | HR 70 | Temp 97.7°F | Resp 17

## 2023-02-20 DIAGNOSIS — N183 Chronic kidney disease, stage 3 unspecified: Secondary | ICD-10-CM

## 2023-02-20 DIAGNOSIS — D631 Anemia in chronic kidney disease: Secondary | ICD-10-CM | POA: Diagnosis not present

## 2023-02-20 LAB — CBC WITH DIFFERENTIAL/PLATELET
Abs Immature Granulocytes: 0.1 10*3/uL — ABNORMAL HIGH (ref 0.00–0.07)
Basophils Absolute: 0 10*3/uL (ref 0.0–0.1)
Basophils Relative: 1 %
Eosinophils Absolute: 0.1 10*3/uL (ref 0.0–0.5)
Eosinophils Relative: 1 %
HCT: 39 % (ref 39.0–52.0)
Hemoglobin: 12 g/dL — ABNORMAL LOW (ref 13.0–17.0)
Immature Granulocytes: 1 %
Lymphocytes Relative: 37 %
Lymphs Abs: 3.3 10*3/uL (ref 0.7–4.0)
MCH: 28 pg (ref 26.0–34.0)
MCHC: 30.8 g/dL (ref 30.0–36.0)
MCV: 90.9 fL (ref 80.0–100.0)
Monocytes Absolute: 0.7 10*3/uL (ref 0.1–1.0)
Monocytes Relative: 8 %
Neutro Abs: 4.6 10*3/uL (ref 1.7–7.7)
Neutrophils Relative %: 52 %
Platelets: 342 10*3/uL (ref 150–400)
RBC: 4.29 MIL/uL (ref 4.22–5.81)
RDW: 13.9 % (ref 11.5–15.5)
WBC: 8.8 10*3/uL (ref 4.0–10.5)
nRBC: 0 % (ref 0.0–0.2)

## 2023-02-20 LAB — RENAL FUNCTION PANEL
Albumin: 3.5 g/dL (ref 3.5–5.0)
Anion gap: 11 (ref 5–15)
BUN: 109 mg/dL — ABNORMAL HIGH (ref 8–23)
CO2: 25 mmol/L (ref 22–32)
Calcium: 8.8 mg/dL — ABNORMAL LOW (ref 8.9–10.3)
Chloride: 103 mmol/L (ref 98–111)
Creatinine, Ser: 3.98 mg/dL — ABNORMAL HIGH (ref 0.61–1.24)
GFR, Estimated: 16 mL/min — ABNORMAL LOW (ref >60)
Glucose, Bld: 54 mg/dL — ABNORMAL LOW (ref 70–99)
Phosphorus: 5.1 mg/dL — ABNORMAL HIGH (ref 2.5–4.6)
Potassium: 3.6 mmol/L (ref 3.5–5.1)
Sodium: 139 mmol/L (ref 135–145)

## 2023-02-20 LAB — POCT HEMOGLOBIN-HEMACUE: Hemoglobin: 12.6 g/dL — ABNORMAL LOW (ref 13.0–17.0)

## 2023-02-20 MED ORDER — EPOETIN ALFA 40000 UNIT/ML IJ SOLN: 40000.0000 [IU] | INTRAMUSCULAR | Status: DC

## 2023-02-21 DIAGNOSIS — Z125 Encounter for screening for malignant neoplasm of prostate: Secondary | ICD-10-CM | POA: Diagnosis not present

## 2023-02-21 DIAGNOSIS — E1142 Type 2 diabetes mellitus with diabetic polyneuropathy: Secondary | ICD-10-CM | POA: Diagnosis not present

## 2023-02-21 DIAGNOSIS — E039 Hypothyroidism, unspecified: Secondary | ICD-10-CM | POA: Diagnosis not present

## 2023-02-21 DIAGNOSIS — E782 Mixed hyperlipidemia: Secondary | ICD-10-CM | POA: Diagnosis not present

## 2023-02-21 DIAGNOSIS — E1169 Type 2 diabetes mellitus with other specified complication: Secondary | ICD-10-CM | POA: Diagnosis not present

## 2023-02-21 DIAGNOSIS — B351 Tinea unguium: Secondary | ICD-10-CM | POA: Diagnosis not present

## 2023-02-28 DIAGNOSIS — Z23 Encounter for immunization: Secondary | ICD-10-CM | POA: Diagnosis not present

## 2023-02-28 DIAGNOSIS — E11649 Type 2 diabetes mellitus with hypoglycemia without coma: Secondary | ICD-10-CM | POA: Diagnosis not present

## 2023-02-28 DIAGNOSIS — Z940 Kidney transplant status: Secondary | ICD-10-CM | POA: Diagnosis not present

## 2023-02-28 DIAGNOSIS — I130 Hypertensive heart and chronic kidney disease with heart failure and stage 1 through stage 4 chronic kidney disease, or unspecified chronic kidney disease: Secondary | ICD-10-CM | POA: Diagnosis not present

## 2023-02-28 DIAGNOSIS — J9601 Acute respiratory failure with hypoxia: Secondary | ICD-10-CM | POA: Diagnosis not present

## 2023-02-28 DIAGNOSIS — D509 Iron deficiency anemia, unspecified: Secondary | ICD-10-CM | POA: Diagnosis not present

## 2023-02-28 DIAGNOSIS — I10 Essential (primary) hypertension: Secondary | ICD-10-CM | POA: Diagnosis not present

## 2023-02-28 DIAGNOSIS — N185 Chronic kidney disease, stage 5: Secondary | ICD-10-CM | POA: Diagnosis not present

## 2023-02-28 DIAGNOSIS — E782 Mixed hyperlipidemia: Secondary | ICD-10-CM | POA: Diagnosis not present

## 2023-02-28 DIAGNOSIS — I5032 Chronic diastolic (congestive) heart failure: Secondary | ICD-10-CM | POA: Diagnosis not present

## 2023-02-28 DIAGNOSIS — G255 Other chorea: Secondary | ICD-10-CM | POA: Diagnosis not present

## 2023-02-28 DIAGNOSIS — E1169 Type 2 diabetes mellitus with other specified complication: Secondary | ICD-10-CM | POA: Diagnosis not present

## 2023-03-06 ENCOUNTER — Encounter (HOSPITAL_COMMUNITY)
Admission: RE | Admit: 2023-03-06 | Discharge: 2023-03-06 | Disposition: A | Discharge status: Home / Self Care | Payer: Medicare Other | Source: Ambulatory Visit | Attending: Nephrology | Admitting: Nephrology

## 2023-03-06 VITALS — BP 162/70 | HR 77 | Temp 97.5°F | Resp 17

## 2023-03-06 DIAGNOSIS — N183 Chronic kidney disease, stage 3 unspecified: Secondary | ICD-10-CM | POA: Insufficient documentation

## 2023-03-06 LAB — CBC WITH DIFFERENTIAL/PLATELET
Abs Immature Granulocytes: 0.13 10*3/uL — ABNORMAL HIGH (ref 0.00–0.07)
Basophils Absolute: 0.1 10*3/uL (ref 0.0–0.1)
Basophils Relative: 1 %
Eosinophils Absolute: 0.1 10*3/uL (ref 0.0–0.5)
Eosinophils Relative: 2 %
HCT: 35.7 % — ABNORMAL LOW (ref 39.0–52.0)
Hemoglobin: 11.2 g/dL — ABNORMAL LOW (ref 13.0–17.0)
Immature Granulocytes: 2 %
Lymphocytes Relative: 32 %
Lymphs Abs: 2.6 10*3/uL (ref 0.7–4.0)
MCH: 28.8 pg (ref 26.0–34.0)
MCHC: 31.4 g/dL (ref 30.0–36.0)
MCV: 91.8 fL (ref 80.0–100.0)
Monocytes Absolute: 0.7 10*3/uL (ref 0.1–1.0)
Monocytes Relative: 8 %
Neutro Abs: 4.5 10*3/uL (ref 1.7–7.7)
Neutrophils Relative %: 55 %
Platelets: 349 10*3/uL (ref 150–400)
RBC: 3.89 MIL/uL — ABNORMAL LOW (ref 4.22–5.81)
RDW: 14 % (ref 11.5–15.5)
WBC: 8 10*3/uL (ref 4.0–10.5)
nRBC: 0 % (ref 0.0–0.2)

## 2023-03-06 LAB — RENAL FUNCTION PANEL
Albumin: 3.4 g/dL — ABNORMAL LOW (ref 3.5–5.0)
Anion gap: 10 (ref 5–15)
BUN: 97 mg/dL — ABNORMAL HIGH (ref 8–23)
CO2: 21 mmol/L — ABNORMAL LOW (ref 22–32)
Calcium: 8.3 mg/dL — ABNORMAL LOW (ref 8.9–10.3)
Chloride: 110 mmol/L (ref 98–111)
Creatinine, Ser: 3.87 mg/dL — ABNORMAL HIGH (ref 0.61–1.24)
GFR, Estimated: 16 mL/min — ABNORMAL LOW (ref >60)
Glucose, Bld: 163 mg/dL — ABNORMAL HIGH (ref 70–99)
Phosphorus: 5.7 mg/dL — ABNORMAL HIGH (ref 2.5–4.6)
Potassium: 4 mmol/L (ref 3.5–5.1)
Sodium: 141 mmol/L (ref 135–145)

## 2023-03-06 LAB — FERRITIN: Ferritin: 106 ng/mL (ref 24–336)

## 2023-03-06 LAB — POCT HEMOGLOBIN-HEMACUE: Hemoglobin: 12.5 g/dL — ABNORMAL LOW (ref 13.0–17.0)

## 2023-03-06 LAB — IRON AND TIBC
Iron: 62 ug/dL (ref 45–182)
Saturation Ratios: 21 % (ref 17.9–39.5)
TIBC: 290 ug/dL (ref 250–450)
UIBC: 228 ug/dL

## 2023-03-06 MED ORDER — EPOETIN ALFA 40000 UNIT/ML IJ SOLN: 40000.0000 [IU] | INTRAMUSCULAR | Status: DC

## 2023-03-08 DIAGNOSIS — N185 Chronic kidney disease, stage 5: Secondary | ICD-10-CM | POA: Diagnosis not present

## 2023-03-08 DIAGNOSIS — N189 Chronic kidney disease, unspecified: Secondary | ICD-10-CM | POA: Diagnosis not present

## 2023-03-08 DIAGNOSIS — Z940 Kidney transplant status: Secondary | ICD-10-CM | POA: Diagnosis not present

## 2023-03-12 ENCOUNTER — Ambulatory Visit (INDEPENDENT_AMBULATORY_CARE_PROVIDER_SITE_OTHER): Payer: Medicare Other | Admitting: Internal Medicine

## 2023-03-12 ENCOUNTER — Encounter: Payer: Self-pay | Admitting: Internal Medicine

## 2023-03-12 VITALS — BP 171/76 | HR 68 | Temp 98.0°F | Ht 71.0 in | Wt 216.8 lb

## 2023-03-12 DIAGNOSIS — R159 Full incontinence of feces: Secondary | ICD-10-CM

## 2023-03-12 DIAGNOSIS — R197 Diarrhea, unspecified: Secondary | ICD-10-CM

## 2023-03-12 MED ORDER — RIFAXIMIN 550 MG PO TABS: 550.0000 mg | ORAL_TABLET | Freq: Three times a day (TID) | ORAL | 0 refills | Status: DC

## 2023-03-12 NOTE — Patient Instructions (Addendum)
It was good to see you again today!  For symptomatic control of the diarrhea try Imodium 2 mg every 6 hours as a scheduled medication

## 2023-03-12 NOTE — Progress Notes (Signed)
Primary Care Physician:  Benita Stabile, MD Primary Gastroenterologist:  Dr. Jena Gauss  Pre-Procedure History & Physical: HPI:  Nathan Tyler is a 67 y.o. male here for Diarrhea x 2 years.  History of kidney transplant awaiting a second transplant at this time.  Nonbloody watery diarrhea for 2 years.  Current days and night varying degrees of formed no blood occasional incontinence wears depends.  Tried Creon for 2 weeks stopped it because he did not think it worked.  Has not taken any anti-diarrheals; prior colonoscopy negative for microscopic colitis.  Weight is up 10 pounds since his last visit in January in our practice.  Longstanding diabetes ;hemoglobin A1c reported to be 7 recently.  Celiac screen previously negative earlier this year.  Interestingly, he tells me Dr. Margo Aye came up with 4 Xifaxan tablets which he took.  Loose stools resolved for about a week and then recurred.  He is on multiple unique medications - none of which standout as potential culprit for his longstanding diarrhea.  Denies illicit drug use these days.  Recently treated for shingles.   Past Medical History:  Diagnosis Date   Anemia    Takes iron shots once a month   Cancer (HCC)    eye - meleonoma   Chorea    COVID-19    2021   Diabetes mellitus without complication (HCC)    type 2   Dyspnea    Essential hypertension    Hepatitis    hep C   Hypercholesteremia    Hypothyroidism    Neuromuscular disorder (HCC)    Pneumonia    PONV (postoperative nausea and vomiting)    Renal failure    Has had kidney transplant 2008   Sleep apnea    Mild. Does not require CPAP use    Past Surgical History:  Procedure Laterality Date   AMPUTATION Right 06/27/2016   Procedure: AMPUTATION 5TH TOE RIGHT FOOT;  Surgeon: Ferman Hamming, DPM;  Location: [facility];  Service: Podiatry;  Laterality: Right;   AMPUTATION TOE Left 09/02/2015   Procedure: AMPUTATION  2ND TOE LEFT FOOT;  Surgeon: Franky Macho, MD;  Location:  [facility];  Service: General;  Laterality: Left;   AV FISTULA PLACEMENT Right 06/27/2022   Procedure: RIGHT BRACHIOCEPHALIC ARTERIOVENOUS (AV) FISTULA CREATION;  Surgeon: Leonie Douglas, MD;  Location: [facility];  Service: Vascular;  Laterality: Right;   BASCILIC VEIN TRANSPOSITION Right 09/10/2022   Procedure: RIGHT UPPER ARM SECOND STAGE BRACHIAL CEPHALIC VEIN TRANSPOSITION;  Surgeon: Leonie Douglas, MD;  Location: [facility] OR;  Service: Vascular;  Laterality: Right;   BIOPSY  11/24/2021   Procedure: BIOPSY;  Surgeon: Corbin Ade, MD;  Location: [facility];  Service: Endoscopy;;   CATARACT EXTRACTION Bilateral    COLONOSCOPY  01/2014   done in [place]. patient reports h/o colon polyps on TCS in 2010, 2015 colonoscopy normal but advised to come back in 2020.    COLONOSCOPY WITH PROPOFOL N/A 01/19/2019   Procedure: COLONOSCOPY WITH PROPOFOL;  Surgeon: Corbin Ade, MD;  Location: [facility];  Service: Endoscopy;  Laterality: N/A;  9:45am   COLONOSCOPY WITH PROPOFOL N/A 11/24/2021   pancolonic diverticulosis, three polyps, adenomas, negative microscopic colitis   IR FLUORO GUIDE CV LINE RIGHT  03/14/2022   IR PERC TUN PERIT CATH WO PORT S&I /IMAG  03/14/2022   IR REMOVAL TUN CV CATH W/O FL  03/19/2022   IR US GUIDE VASC ACCESS RIGHT  03/14/2022   KIDNEY  TRANSPLANT Right 2010   POLYPECTOMY  01/19/2019   Procedure: POLYPECTOMY;  Surgeon: Corbin Ade, MD;  Location: [facility];  Service: Endoscopy;;   POLYPECTOMY  11/24/2021   Procedure: POLYPECTOMY;  Surgeon: Corbin Ade, MD;  Location: [facility];  Service: Endoscopy;;    Prior to Admission medications   Medication Sig Start Date End Date Taking? Authorizing Provider  amLODipine (NORVASC) 10 MG tablet Take 10 mg by mouth in the morning. 12/28/21  Yes [provider]  atorvastatin (LIPITOR) 10 MG tablet Take 10 mg by mouth in the morning.   Yes [provider]  carvedilol (COREG) 6.25 MG tablet Take 6.25 mg by  mouth 2 (two) times daily with a meal.   Yes [provider]  Deutetrabenazine (AUSTEDO) 6 MG TABS Take 6 mg by mouth 2 (two) times daily.   Yes [provider]  furosemide (LASIX) 80 MG tablet Take 1 tablet (80 mg total) by mouth 2 (two) times daily. Patient taking differently: Take 40-80 mg by mouth See admin instructions. Take 80 mg in the morning and 40 mg at night 03/27/22 09/05/24 Yes Kc, Ramesh, MD  GVOKE HYPOPEN 2-PACK 1 MG/0.2ML SOAJ Inject 1 Dose as directed as needed (SUGARS SEVERELY LOW LESS THAN 55 NOT RESPONDING TO SUGAR INTAKE). 04/27/22  Yes [provider]  hydrALAZINE (APRESOLINE) 50 MG tablet Take 50 mg by mouth 3 (three) times daily. 02/06/22  Yes [provider]  insulin aspart (NOVOLOG) 100 UNIT/ML injection Inject into the skin continuous. Uses in omnipod insulin pump   Yes [provider]  Insulin Disposable Pump (OMNIPOD DASH PODS, GEN 4,) MISC  02/19/22  Yes [provider]  levothyroxine (SYNTHROID) 125 MCG tablet Take 125 mcg by mouth daily before breakfast. 02/20/22  Yes [provider]  mycophenolate (MYFORTIC) 180 MG EC tablet Take 1 tablet (180 mg total) by mouth 2 (two) times daily. 02/28/22  Yes Marinda Elk, MD  OVER THE COUNTER MEDICATION Dexacon G 7 glucose monitor   Yes [provider]  oxyCODONE (OXY IR/ROXICODONE) 5 MG immediate release tablet Take by mouth. 12/29/22  Yes [provider]  predniSONE (DELTASONE) 5 MG tablet Take 1 tablet (5 mg total) by mouth daily with breakfast. restart 5mg  dose 3/10 after completing prednisone taper (2/24 - 3/9) 06/16/19  Yes Tyrone Nine, MD  sodium bicarbonate 650 MG tablet Take 650 mg by mouth 2 (two) times daily. 11/18/21  Yes [provider]  tacrolimus (PROGRAF) 0.5 MG capsule Take 1.5 mg by mouth 2 (two) times daily.    Yes [provider]  traMADol (ULTRAM) 50 MG tablet Take 1 tablet (50 mg total) by mouth every 6 (six)  hours as needed. 09/10/22 09/10/23 Yes Lars Mage, PA-C  valACYclovir (VALTREX) 1000 MG tablet Take 1,000 mg by mouth daily. 02/04/23  Yes [provider]    Allergies as of 03/12/2023   (No Known Allergies)    Family History  Problem Relation Age of Onset   Colon cancer Maternal Aunt 53       passed from [facility]    Social History   Socioeconomic History   Marital status: Widowed    Spouse name: Not on file   Number of children: Not on file   Years of education: Not on file   Highest education level: Not on file  Occupational History   Not on file  Tobacco Use   Smoking status: Never    Passive  exposure: Never   Smokeless tobacco: Never  Vaping Use   Vaping status: Never Used  Substance and Sexual Activity   Alcohol use: Not Currently    Comment: As of 09/02/2018: Glass of wine 1-2 times a month   Drug use: Not Currently    Types: "Crack" cocaine    Comment: 2016- for 6 months- went through rehab   Sexual activity: Yes    Birth control/protection: None  Other Topics Concern   Not on file  Social History Narrative   Not on file   Social Determinants of Health   Financial Resource Strain: Not on file  Food Insecurity: No Food Insecurity (03/09/2022)   Hunger Vital Sign    Worried About Running Out of Food in the Last Year: Never true    Ran Out of Food in the Last Year: Never true  Transportation Needs: No Transportation Needs (03/09/2022)   PRAPARE - Administrator, Civil Service (Medical): No    Lack of Transportation (Non-Medical): No  Physical Activity: Not on file  Stress: Not on file  Social Connections: Unknown (11/03/2021)   Received from [facility]   Social Network    Social Network: Not on file  Intimate Partner Violence: Not At Risk (03/09/2022)   Humiliation, Afraid, Rape, and Kick questionnaire    Fear of Current or Ex-Partner: No    Emotionally Abused: No    Physically Abused: No    Sexually Abused: No     Review of Systems: See HPI, otherwise negative ROS  Physical Exam: BP (!) 171/76 (BP Location: Left Arm, Patient Position: Sitting, Cuff Size: Large)   Pulse 68   Temp 98 F (36.7 C) (Oral)   Ht 5\' 11"  (1.803 m)   Wt 216 lb 12.8 oz (98.3 kg)   SpO2 97%   BMI 30.24 kg/m  General:   Alert,  Well-developed, well-nourished, pleasant and cooperative in NAD cervical adenopathy. Lungs:  Clear throughout to auscultation.   No wheezes, crackles, or rhonchi. No acute distress. Heart:  Regular rate and rhythm; no murmurs, clicks, rubs,  or gallops. Abdomen: Non-distended, normal bowel sounds.  Soft and nontender without appreciable mass or hepatosplenomegaly.   Impression/Plan:    Diarrhea incontinence for 2 years now.  Etiology uncertain.  This may be simply a manifestation of diabetes producing an enteropathy (no metformin use recently); would like to assess /quantify pancreatic exocrine function with a fecal elastase  His short-term response to small dose of Xifaxan is interesting.  I think would be worthwhile to go ahead and give him a course of therapy for IBS-D if we can get insurance to approve.  May well have an element of IBS-D  Recommendations:  For symptomatic control of the diarrhea try Imodium 2 mg every 6 hours as a scheduled medication  Stool sample for fecal elastase  Would like to see if we can get insurance approval for Xifaxan as a treatment for irritable bowel syndrome in the near future.  Further recommendations to follow.  Office visit in 3 months         Notice: This dictation was prepared with Dragon dictation along with smaller phrase technology. Any transcriptional errors that result from this process are unintentional and may not be corrected upon review.

## 2023-03-14 NOTE — Unmapped (Signed)
[facility] Refill Coordination Note    Specialty Medication(s) to be Shipped:   Neurology: Gary Rogers    Other medication(s) to be shipped: No additional medications requested for fill at this time     Gary Rogers, DOB: October 20, 1955  Phone: [phone] (home)       All above HIPAA information was verified with patient.     Was a Nurse, learning disability used for this call? No    Completed refill call assessment today to schedule patient's medication shipment from the [facility]  [phone]).  All relevant notes have been reviewed.     Specialty medication(s) and dose(s) confirmed: Regimen is correct and unchanged.   Changes to medications: Gary Rogers reports no changes at this time.  Changes to insurance: No  New side effects reported not previously addressed with a pharmacist or physician: None reported  Questions for the pharmacist: No    Confirmed patient received a Conservation officer, historic buildings and a Surveyor, mining with first shipment. The patient will receive a drug information handout for each medication shipped and additional FDA Medication Guides as required.       DISEASE/MEDICATION-SPECIFIC INFORMATION        N/A    SPECIALTY MEDICATION ADHERENCE     Medication Adherence    Patient reported X missed doses in the last month: 0  Specialty Medication: AUSTEDO 6 mg Tab  Patient is on additional specialty medications: No  Patient is on more than two specialty medications: No  Any gaps in refill history greater than 2 weeks in the last 3 months: no  Demonstrates understanding of importance of adherence: yes              Were doses missed due to medication being on hold? No    AUSTEDO 6  mg: 7 days of medicine on hand       REFERRAL TO PHARMACIST     Referral to the pharmacist: Not needed      [facility]     Shipping address confirmed in Epic.       Delivery Scheduled: Yes, Expected medication delivery date: 03/19/23.     Medication will be delivered via UPS to the prescription address in Epic WAM.    Gary Rogers   [facility]  Specialty Technician

## 2023-03-18 MED FILL — AUSTEDO 6 MG TABLET: ORAL | 30 days supply | Qty: 60 | Fill #4

## 2023-03-20 ENCOUNTER — Encounter (HOSPITAL_COMMUNITY)
Admission: RE | Admit: 2023-03-20 | Discharge: 2023-03-20 | Disposition: A | Discharge status: Home / Self Care | Payer: Medicare Other | Source: Ambulatory Visit | Attending: Nephrology | Admitting: Nephrology

## 2023-03-20 ENCOUNTER — Encounter (HOSPITAL_COMMUNITY): Payer: Medicare Other

## 2023-03-20 VITALS — BP 164/67 | HR 73 | Temp 97.6°F | Resp 17

## 2023-03-20 DIAGNOSIS — N183 Chronic kidney disease, stage 3 unspecified: Secondary | ICD-10-CM | POA: Diagnosis not present

## 2023-03-20 LAB — POCT HEMOGLOBIN-HEMACUE: Hemoglobin: 11 g/dL — ABNORMAL LOW (ref 13.0–17.0)

## 2023-03-20 MED ORDER — EPOETIN ALFA 40000 UNIT/ML IJ SOLN
INTRAMUSCULAR | Status: AC
  Filled 2023-03-20: qty 1

## 2023-03-20 MED ORDER — EPOETIN ALFA 40000 UNIT/ML IJ SOLN
40000.0000 [IU] | INTRAMUSCULAR | Status: DC
Start: 2023-03-20 — End: 2023-03-21
  Administered 2023-03-20: 40000 [IU] via SUBCUTANEOUS

## 2023-03-28 ENCOUNTER — Emergency Department (HOSPITAL_COMMUNITY): Payer: Medicare Other

## 2023-03-28 ENCOUNTER — Other Ambulatory Visit: Payer: Self-pay

## 2023-03-28 ENCOUNTER — Encounter (HOSPITAL_COMMUNITY): Payer: Self-pay

## 2023-03-28 ENCOUNTER — Observation Stay (HOSPITAL_COMMUNITY)
Admission: EM | Admit: 2023-03-28 | Discharge: 2023-03-29 | Disposition: A | Discharge status: Home / Self Care | Payer: Medicare Other | Attending: Internal Medicine | Admitting: Internal Medicine

## 2023-03-28 DIAGNOSIS — Z794 Long term (current) use of insulin: Secondary | ICD-10-CM | POA: Diagnosis not present

## 2023-03-28 DIAGNOSIS — D649 Anemia, unspecified: Secondary | ICD-10-CM | POA: Diagnosis not present

## 2023-03-28 DIAGNOSIS — R42 Dizziness and giddiness: Secondary | ICD-10-CM | POA: Diagnosis not present

## 2023-03-28 DIAGNOSIS — Z940 Kidney transplant status: Secondary | ICD-10-CM | POA: Insufficient documentation

## 2023-03-28 DIAGNOSIS — I132 Hypertensive heart and chronic kidney disease with heart failure and with stage 5 chronic kidney disease, or end stage renal disease: Secondary | ICD-10-CM | POA: Diagnosis not present

## 2023-03-28 DIAGNOSIS — R4182 Altered mental status, unspecified: Secondary | ICD-10-CM | POA: Diagnosis not present

## 2023-03-28 DIAGNOSIS — R0989 Other specified symptoms and signs involving the circulatory and respiratory systems: Secondary | ICD-10-CM | POA: Diagnosis not present

## 2023-03-28 DIAGNOSIS — I10 Essential (primary) hypertension: Secondary | ICD-10-CM | POA: Diagnosis present

## 2023-03-28 DIAGNOSIS — R55 Syncope and collapse: Secondary | ICD-10-CM | POA: Diagnosis not present

## 2023-03-28 DIAGNOSIS — E785 Hyperlipidemia, unspecified: Secondary | ICD-10-CM | POA: Diagnosis not present

## 2023-03-28 DIAGNOSIS — G20C Parkinsonism, unspecified: Secondary | ICD-10-CM | POA: Diagnosis not present

## 2023-03-28 DIAGNOSIS — R220 Localized swelling, mass and lump, head: Secondary | ICD-10-CM | POA: Diagnosis not present

## 2023-03-28 DIAGNOSIS — R531 Weakness: Secondary | ICD-10-CM | POA: Diagnosis not present

## 2023-03-28 DIAGNOSIS — E162 Hypoglycemia, unspecified: Principal | ICD-10-CM | POA: Diagnosis present

## 2023-03-28 DIAGNOSIS — Z89421 Acquired absence of other right toe(s): Secondary | ICD-10-CM | POA: Diagnosis not present

## 2023-03-28 DIAGNOSIS — E039 Hypothyroidism, unspecified: Secondary | ICD-10-CM | POA: Insufficient documentation

## 2023-03-28 DIAGNOSIS — E11649 Type 2 diabetes mellitus with hypoglycemia without coma: Principal | ICD-10-CM | POA: Insufficient documentation

## 2023-03-28 DIAGNOSIS — N183 Chronic kidney disease, stage 3 unspecified: Secondary | ICD-10-CM | POA: Diagnosis not present

## 2023-03-28 DIAGNOSIS — Z85828 Personal history of other malignant neoplasm of skin: Secondary | ICD-10-CM | POA: Insufficient documentation

## 2023-03-28 DIAGNOSIS — N185 Chronic kidney disease, stage 5: Secondary | ICD-10-CM | POA: Diagnosis not present

## 2023-03-28 DIAGNOSIS — Z8616 Personal history of COVID-19: Secondary | ICD-10-CM | POA: Insufficient documentation

## 2023-03-28 DIAGNOSIS — E119 Type 2 diabetes mellitus without complications: Secondary | ICD-10-CM

## 2023-03-28 DIAGNOSIS — W19XXXA Unspecified fall, initial encounter: Secondary | ICD-10-CM | POA: Diagnosis not present

## 2023-03-28 DIAGNOSIS — I5032 Chronic diastolic (congestive) heart failure: Secondary | ICD-10-CM | POA: Diagnosis not present

## 2023-03-28 DIAGNOSIS — Z79899 Other long term (current) drug therapy: Secondary | ICD-10-CM | POA: Insufficient documentation

## 2023-03-28 DIAGNOSIS — R0682 Tachypnea, not elsewhere classified: Secondary | ICD-10-CM | POA: Diagnosis not present

## 2023-03-28 DIAGNOSIS — I6782 Cerebral ischemia: Secondary | ICD-10-CM | POA: Diagnosis not present

## 2023-03-28 DIAGNOSIS — S0990XA Unspecified injury of head, initial encounter: Secondary | ICD-10-CM | POA: Diagnosis not present

## 2023-03-28 DIAGNOSIS — N189 Chronic kidney disease, unspecified: Secondary | ICD-10-CM | POA: Diagnosis present

## 2023-03-28 DIAGNOSIS — E1122 Type 2 diabetes mellitus with diabetic chronic kidney disease: Secondary | ICD-10-CM | POA: Insufficient documentation

## 2023-03-28 LAB — CORTISOL-AM, BLOOD: Cortisol - AM: 68.6 ug/dL — ABNORMAL HIGH (ref 6.7–22.6)

## 2023-03-28 LAB — HIV ANTIBODY (ROUTINE TESTING W REFLEX): HIV Screen 4th Generation wRfx: NONREACTIVE

## 2023-03-28 LAB — GLUCOSE, CAPILLARY
Glucose-Capillary: 199 mg/dL — ABNORMAL HIGH (ref 70–99)
Glucose-Capillary: 305 mg/dL — ABNORMAL HIGH (ref 70–99)
Glucose-Capillary: 379 mg/dL — ABNORMAL HIGH (ref 70–99)

## 2023-03-28 LAB — COMPREHENSIVE METABOLIC PANEL
ALT: 8 U/L (ref 0–44)
AST: 15 U/L (ref 15–41)
Albumin: 3.2 g/dL — ABNORMAL LOW (ref 3.5–5.0)
Alkaline Phosphatase: 79 U/L (ref 38–126)
Anion gap: 10 (ref 5–15)
BUN: 87 mg/dL — ABNORMAL HIGH (ref 8–23)
CO2: 19 mmol/L — ABNORMAL LOW (ref 22–32)
Calcium: 8.4 mg/dL — ABNORMAL LOW (ref 8.9–10.3)
Chloride: 112 mmol/L — ABNORMAL HIGH (ref 98–111)
Creatinine, Ser: 4.33 mg/dL — ABNORMAL HIGH (ref 0.61–1.24)
GFR, Estimated: 14 mL/min — ABNORMAL LOW (ref >60)
Glucose, Bld: 83 mg/dL (ref 70–99)
Potassium: 4.2 mmol/L (ref 3.5–5.1)
Sodium: 141 mmol/L (ref 135–145)
Total Bilirubin: 0.6 mg/dL (ref <1.2)
Total Protein: 5.7 g/dL — ABNORMAL LOW (ref 6.5–8.1)

## 2023-03-28 LAB — CBC
HCT: 32.5 % — ABNORMAL LOW (ref 39.0–52.0)
Hemoglobin: 10.1 g/dL — ABNORMAL LOW (ref 13.0–17.0)
MCH: 28.9 pg (ref 26.0–34.0)
MCHC: 31.1 g/dL (ref 30.0–36.0)
MCV: 92.9 fL (ref 80.0–100.0)
Platelets: 307 10*3/uL (ref 150–400)
RBC: 3.5 MIL/uL — ABNORMAL LOW (ref 4.22–5.81)
RDW: 15.1 % (ref 11.5–15.5)
WBC: 6.9 10*3/uL (ref 4.0–10.5)
nRBC: 0 % (ref 0.0–0.2)

## 2023-03-28 LAB — TROPONIN I (HIGH SENSITIVITY)
Troponin I (High Sensitivity): 12 ng/L (ref <18)
Troponin I (High Sensitivity): 12 ng/L (ref <18)

## 2023-03-28 LAB — CBG MONITORING, ED
Glucose-Capillary: 42 mg/dL — CL (ref 70–99)
Glucose-Capillary: 77 mg/dL (ref 70–99)

## 2023-03-28 LAB — TSH: TSH: 5.809 u[IU]/mL — ABNORMAL HIGH (ref 0.350–4.500)

## 2023-03-28 MED ORDER — TACROLIMUS 1 MG PO CAPS
1.5000 mg | ORAL_CAPSULE | Freq: Two times a day (BID) | ORAL | Status: DC
  Administered 2023-03-28 – 2023-03-29 (×2): 1.5 mg via ORAL
  Filled 2023-03-28 (×3): qty 1

## 2023-03-28 MED ORDER — SODIUM CHLORIDE 0.9% FLUSH: 3.0000 mL | Freq: Two times a day (BID) | INTRAVENOUS | Status: DC

## 2023-03-28 MED ORDER — ATORVASTATIN CALCIUM 10 MG PO TABS
10.0000 mg | ORAL_TABLET | Freq: Every day | ORAL | Status: DC
  Administered 2023-03-29: 10 mg via ORAL
  Filled 2023-03-28: qty 1

## 2023-03-28 MED ORDER — AMLODIPINE BESYLATE 5 MG PO TABS
10.0000 mg | ORAL_TABLET | Freq: Every day | ORAL | Status: DC
  Filled 2023-03-28: qty 2

## 2023-03-28 MED ORDER — SODIUM CHLORIDE 0.9% FLUSH
3.0000 mL | Freq: Two times a day (BID) | INTRAVENOUS | Status: DC
  Administered 2023-03-28: 3 mL via INTRAVENOUS

## 2023-03-28 MED ORDER — FUROSEMIDE 40 MG PO TABS
40.0000 mg | ORAL_TABLET | Freq: Every day | ORAL | Status: DC
  Administered 2023-03-28: 40 mg via ORAL
  Filled 2023-03-28: qty 1

## 2023-03-28 MED ORDER — OXYCODONE HCL 5 MG PO TABS: 5.0000 mg | ORAL_TABLET | Freq: Four times a day (QID) | ORAL | Status: DC | PRN

## 2023-03-28 MED ORDER — FUROSEMIDE 40 MG PO TABS
80.0000 mg | ORAL_TABLET | Freq: Every day | ORAL | Status: DC
  Administered 2023-03-29: 80 mg via ORAL
  Filled 2023-03-28: qty 2

## 2023-03-28 MED ORDER — HYDROCORTISONE SOD SUC (PF) 100 MG IJ SOLR
100.0000 mg | Freq: Once | INTRAMUSCULAR | Status: AC
  Administered 2023-03-28: 100 mg via INTRAVENOUS
  Filled 2023-03-28: qty 2

## 2023-03-28 MED ORDER — HEPARIN SODIUM (PORCINE) 5000 UNIT/ML IJ SOLN
5000.0000 [IU] | Freq: Three times a day (TID) | INTRAMUSCULAR | Status: DC
  Administered 2023-03-28 – 2023-03-29 (×3): 5000 [IU] via SUBCUTANEOUS
  Filled 2023-03-28 (×3): qty 1

## 2023-03-28 MED ORDER — HYDRALAZINE HCL 25 MG PO TABS
50.0000 mg | ORAL_TABLET | Freq: Three times a day (TID) | ORAL | Status: DC
  Administered 2023-03-28 (×2): 50 mg via ORAL
  Filled 2023-03-28 (×3): qty 2

## 2023-03-28 MED ORDER — TRAMADOL HCL 50 MG PO TABS: 50.0000 mg | ORAL_TABLET | Freq: Four times a day (QID) | ORAL | Status: DC | PRN

## 2023-03-28 MED ORDER — RIFAXIMIN 550 MG PO TABS
550.0000 mg | ORAL_TABLET | Freq: Three times a day (TID) | ORAL | Status: DC
  Administered 2023-03-28 – 2023-03-29 (×2): 550 mg via ORAL
  Filled 2023-03-28 (×4): qty 1

## 2023-03-28 MED ORDER — POLYETHYLENE GLYCOL 3350 17 G PO PACK: 17.0000 g | PACK | Freq: Every day | ORAL | Status: DC | PRN

## 2023-03-28 MED ORDER — CARVEDILOL 3.125 MG PO TABS
6.2500 mg | ORAL_TABLET | Freq: Two times a day (BID) | ORAL | Status: DC
  Administered 2023-03-28: 6.25 mg via ORAL
  Filled 2023-03-28 (×2): qty 2

## 2023-03-28 MED ORDER — PREDNISONE 5 MG PO TABS
5.0000 mg | ORAL_TABLET | Freq: Every day | ORAL | Status: DC
  Administered 2023-03-29: 5 mg via ORAL
  Filled 2023-03-28: qty 1

## 2023-03-28 MED ORDER — DEXTROSE 50 % IV SOLN: 1.0000 | Freq: Once | INTRAVENOUS | Status: DC

## 2023-03-28 MED ORDER — FUROSEMIDE 20 MG PO TABS: 40.0000 mg | ORAL_TABLET | ORAL | Status: DC

## 2023-03-28 MED ORDER — DEXTROSE 5 % IV SOLN
Freq: Once | INTRAVENOUS | Status: AC
Start: 2023-03-28 — End: 2023-03-28

## 2023-03-28 MED ORDER — SODIUM CHLORIDE 0.9% FLUSH: 3.0000 mL | INTRAVENOUS | Status: DC | PRN

## 2023-03-28 MED ORDER — MYCOPHENOLATE SODIUM 180 MG PO TBEC
180.0000 mg | DELAYED_RELEASE_TABLET | Freq: Two times a day (BID) | ORAL | Status: DC
  Administered 2023-03-28 – 2023-03-29 (×2): 180 mg via ORAL
  Filled 2023-03-28 (×3): qty 1

## 2023-03-28 MED ORDER — SODIUM CHLORIDE 0.9 % IV SOLN: 250.0000 mL | INTRAVENOUS | Status: DC | PRN

## 2023-03-28 MED ORDER — ACETAMINOPHEN 650 MG RE SUPP: 650.0000 mg | Freq: Four times a day (QID) | RECTAL | Status: DC | PRN

## 2023-03-28 MED ORDER — TRAMADOL HCL 50 MG PO TABS: 50.0000 mg | ORAL_TABLET | Freq: Two times a day (BID) | ORAL | Status: DC | PRN

## 2023-03-28 MED ORDER — DEUTETRABENAZINE 6 MG PO TABS: 6.0000 mg | ORAL_TABLET | Freq: Two times a day (BID) | ORAL | Status: DC

## 2023-03-28 MED ORDER — DEXTROSE 50 % IV SOLN: 25.0000 g | Freq: Once | INTRAVENOUS | Status: DC

## 2023-03-28 MED ORDER — DEXTROSE 5 % IV SOLN: INTRAVENOUS | Status: DC

## 2023-03-28 MED ORDER — ACETAMINOPHEN 325 MG PO TABS: 650.0000 mg | ORAL_TABLET | Freq: Four times a day (QID) | ORAL | Status: DC | PRN

## 2023-03-28 MED ORDER — LEVOTHYROXINE SODIUM 25 MCG PO TABS
137.0000 ug | ORAL_TABLET | Freq: Every day | ORAL | Status: DC
  Administered 2023-03-29: 137 ug via ORAL
  Filled 2023-03-28: qty 1

## 2023-03-28 NOTE — ED Triage Notes (Signed)
Pt to ED via [facility] from home. Pt woke up this am and cbg = 40s and he felt lightheaded. He ate and continued to feel lightheaded and had a near syncope event. Pt fell against the wall and then landed on floor. Pt denies injuries. Pt continues to feel lightheaded.  Pt has had multiple falls over past few weeks.   170/80, 130/94 Cbg=113

## 2023-03-28 NOTE — H&P (Signed)
History and Physical   Nathan Tyler XBM:[id number] DOB: [birth year] DOA: 03/28/2023  PCP: Benita Stabile, MD   Patient coming from: Home  Chief Complaint: Hypoglycemia, near-syncope  HPI: Nathan Tyler is a 67 y.o. male with medical history significant of hypertension, hyperlipidemia, hypothyroidism, diabetes, CKD 4-5, status post renal transplant 2010, diastolic CHF, hepatitis C status post treatment, Parkinson's disease, chorea/tardive dyskinesia presenting with hypoglycemia near syncope.  Patient woke up this morning and felt unusual his blood sugar was low.  He did feel lightheaded.  He ate a cookie and had improvement in his blood sugar but he had persistent lightheadedness and then had a near syncopal episode, but was able to lower himself to the ground.  EMS was called and confirmed low blood sugar and route.  Patient reports that he ran out of his prednisone on Tuesday and has not had any doses since.  Has not been using boluses from his insulin pump for the past couple days and takes no other medications for his diabetes. Chart shows he does have a history of hypoglycemia and had been prescribed glucagon pen which he has but did not try during this episode.  Feeling better in the ED after doses of D50 followed by D5 infusion.  Denies fevers, chills, chest pain, shortness of breath, abdominal pain, constipation, diarrhea, nausea, vomiting.  ED Course: Vital signs in the ED notable for blood pressure in the 150s systolic, heart rate in the 50s.  Lab workup included CMP with chloride 112, bicarb 19, BUN 87, creatinine 4.3 which is near baseline at 3.9-4.1, calcium 8.4, protein 517, Alb 3.2.  CBC with hemoglobin 10.1 near baseline of 11-12.  Troponin normal.  TSH pending.  Urinalysis pending.  CBG initially low 42 improved to 77 with intervention.  Chest x-ray showed vascular congestion.  CT head showed no acute Gershon Mussel.  Patient received large fluids, D50, D5 infusion in the ED.  Also received a  dose of Solu-Cortef.  Review of Systems: As per HPI otherwise all other systems reviewed and are negative.  Past Medical History:  Diagnosis Date   Acute metabolic encephalopathy 02/22/2022   Acute renal failure superimposed on stage 4 chronic kidney disease (HCC) 02/22/2022   Acute respiratory failure with hypoxia (HCC) 06/03/2019   Anemia    Takes iron shots once a month   Cancer (HCC)    eye - meleonoma   Chorea    COVID-19    2021   COVID-19 virus infection 05/31/2019   Diabetes mellitus without complication (HCC)    type 2   Dyspnea    Essential hypertension    Hepatitis    hep C   Hepatitis C antibody positive in blood 03/12/2017   Hypercholesteremia    Hypothyroidism    Neuromuscular disorder (HCC)    Pneumonia    PONV (postoperative nausea and vomiting)    Renal failure    Has had kidney transplant 2008   Sleep apnea    Mild. Does not require CPAP use    Past Surgical History:  Procedure Laterality Date   AMPUTATION Right 06/27/2016   Procedure: AMPUTATION 5TH TOE RIGHT FOOT;  Surgeon: Ferman Hamming, DPM;  Location: [facility];  Service: Podiatry;  Laterality: Right;   AMPUTATION TOE Left 09/02/2015   Procedure: AMPUTATION  2ND TOE LEFT FOOT;  Surgeon: Franky Macho, MD;  Location: [facility];  Service: General;  Laterality: Left;   AV FISTULA PLACEMENT Right 06/27/2022   Procedure: RIGHT BRACHIOCEPHALIC ARTERIOVENOUS (AV) FISTULA  CREATION;  Surgeon: Leonie Douglas, MD;  Location: [facility] OR;  Service: Vascular;  Laterality: Right;   BASCILIC VEIN TRANSPOSITION Right 09/10/2022   Procedure: RIGHT UPPER ARM SECOND STAGE BRACHIAL CEPHALIC VEIN TRANSPOSITION;  Surgeon: Leonie Douglas, MD;  Location: [facility] OR;  Service: Vascular;  Laterality: Right;   BIOPSY  11/24/2021   Procedure: BIOPSY;  Surgeon: Corbin Ade, MD;  Location: [facility];  Service: Endoscopy;;   CATARACT EXTRACTION Bilateral    COLONOSCOPY  01/2014   done in [place]. patient reports h/o colon polyps  on TCS in 2010, 2015 colonoscopy normal but advised to come back in 2020.    COLONOSCOPY WITH PROPOFOL N/A 01/19/2019   Procedure: COLONOSCOPY WITH PROPOFOL;  Surgeon: Corbin Ade, MD;  Location: [facility];  Service: Endoscopy;  Laterality: N/A;  9:45am   COLONOSCOPY WITH PROPOFOL N/A 11/24/2021   pancolonic diverticulosis, three polyps, adenomas, negative microscopic colitis   IR FLUORO GUIDE CV LINE RIGHT  03/14/2022   IR PERC TUN PERIT CATH WO PORT S&I /IMAG  03/14/2022   IR REMOVAL TUN CV CATH W/O FL  03/19/2022   IR US GUIDE VASC ACCESS RIGHT  03/14/2022   KIDNEY TRANSPLANT Right 2010   POLYPECTOMY  01/19/2019   Procedure: POLYPECTOMY;  Surgeon: Corbin Ade, MD;  [facility]: [facility];  Service: Endoscopy;;   POLYPECTOMY  11/24/2021   Procedure: POLYPECTOMY;  Surgeon: Corbin Ade, MD;  Location: [facility];  Service: Endoscopy;;    Social History  reports that he has never smoked. He has never been exposed to tobacco smoke. He has never used smokeless tobacco. He reports that he does not currently use alcohol. He reports that he does not currently use drugs after having used the following drugs: "Crack" cocaine.  No Known Allergies  Family History  Problem Relation Age of Onset   Colon cancer Maternal Aunt 33       passed from [facility]  Reviewed admission  Prior to Admission medications   Medication Sig Start Date End Date Taking? Authorizing Provider  amLODipine (NORVASC) 10 MG tablet Take 10 mg by mouth in the morning. 12/28/21   [provider]  atorvastatin (LIPITOR) 10 MG tablet Take 10 mg by mouth in the morning.    [provider]  carvedilol (COREG) 6.25 MG tablet Take 6.25 mg by mouth 2 (two) times daily with a meal.    [provider]  Deutetrabenazine (AUSTEDO) 6 MG TABS Take 6 mg by mouth 2 (two) times daily.    [provider]  furosemide (LASIX) 80 MG tablet Take 1 tablet (80 mg total) by mouth 2 (two) times  daily. Patient taking differently: Take 40-80 mg by mouth See admin instructions. Take 80 mg in the morning and 40 mg at night 03/27/22 09/05/24  Kc, Dayna Barker, MD  GVOKE HYPOPEN 2-PACK 1 MG/0.2ML SOAJ Inject 1 Dose as directed as needed (SUGARS SEVERELY LOW LESS THAN 55 NOT RESPONDING TO SUGAR INTAKE). 04/27/22   [provider]  hydrALAZINE (APRESOLINE) 50 MG tablet Take 50 mg by mouth 3 (three) times daily. 02/06/22   [provider]  insulin aspart (NOVOLOG) 100 UNIT/ML injection Inject into the skin continuous. Uses in omnipod insulin pump    [provider]  Insulin Disposable Pump (OMNIPOD DASH PODS, GEN 4,) MISC  02/19/22   [provider]  levothyroxine (SYNTHROID) 125 MCG tablet Take 125 mcg by mouth daily before breakfast. 02/20/22   [provider]  mycophenolate (MYFORTIC) 180 MG EC tablet Take 1 tablet (180 mg total) by mouth 2 (two) times daily. 02/28/22   Marinda Elk, MD  OVER THE COUNTER MEDICATION Dexacon G 7 glucose monitor    [provider]  oxyCODONE (OXY IR/ROXICODONE) 5 MG immediate release tablet Take by mouth. 12/29/22   [provider]  predniSONE (DELTASONE) 5 MG tablet Take 1 tablet (5 mg total) by mouth daily with breakfast. restart 5mg  dose 3/10 after completing prednisone taper (2/24 - 3/9) 06/16/19   Tyrone Nine, MD  rifaximin (XIFAXAN) 550 MG TABS tablet Take 1 tablet (550 mg total) by mouth 3 (three) times daily. 03/12/23   Rourk, Gerrit Friends, MD  sodium bicarbonate 650 MG tablet Take 650 mg by mouth 2 (two) times daily. 11/18/21   [provider]  tacrolimus (PROGRAF) 0.5 MG capsule Take 1.5 mg by mouth 2 (two) times daily.     [provider]  traMADol (ULTRAM) 50 MG tablet Take 1 tablet (50 mg total) by mouth every 6 (six) hours as needed. 09/10/22 09/10/23  Lars Mage, PA-C  valACYclovir (VALTREX) 1000 MG tablet Take 1,000 mg by mouth daily. 02/04/23   [provider]     Physical Exam: Vitals:   03/28/23 1052 03/28/23 1103  BP: (!) 153/60   Pulse: (!) 59   Resp: 20   Temp: 97.7 F (36.5 C)   TempSrc: Oral   SpO2: 100%   Weight:  97.1 kg  Height:  5\' 11"  (1.803 m)    Physical Exam Constitutional:      General: He is not in acute distress.    Appearance: Normal appearance.  HENT:     Head: Normocephalic and atraumatic.     Mouth/Throat:     Mouth: Mucous membranes are moist.     Pharynx: Oropharynx is clear.  Eyes:     Extraocular Movements: Extraocular movements intact.     Pupils: Pupils are equal, round, and reactive to light.  Cardiovascular:     Rate and Rhythm: Normal rate and regular rhythm.     Pulses: Normal pulses.     Heart sounds: Normal heart sounds.  Pulmonary:     Effort: Pulmonary effort is normal. No respiratory distress.     Breath sounds: Normal breath sounds.  Abdominal:     General: Bowel sounds are normal. There is no distension.     Palpations: Abdomen is soft.     Tenderness: There is no abdominal tenderness.  Musculoskeletal:        General: No swelling or deformity.  Skin:    General: Skin is warm and dry.  Neurological:     General: No focal deficit present.     Mental Status: Mental status is at baseline.    Labs on Admission: I have personally reviewed following labs and imaging studies  CBC: Recent Labs  Lab 03/28/23 1104  WBC 6.9  HGB 10.1*  HCT 32.5*  MCV 92.9  PLT 307    Basic Metabolic Panel: Recent Labs  Lab 03/28/23 1104  NA 141  K 4.2  CL 112*  CO2 19*  GLUCOSE 83  BUN 87*  CREATININE 4.33*  CALCIUM 8.4*    GFR: Estimated Creatinine Clearance: 19.1 mL/min (A) (by C-G formula based on SCr of 4.33 mg/dL (H)).  Liver Function Tests: Recent Labs  Lab 03/28/23 1104  AST 15  ALT 8  ALKPHOS 79  BILITOT 0.6  PROT 5.7*  ALBUMIN 3.2*  Urine analysis:    Component Value Date/Time   COLORURINE YELLOW 03/10/2022 2229   APPEARANCEUR HAZY (A) 03/10/2022 2229    LABSPEC 1.013 03/10/2022 2229   PHURINE 5.0 03/10/2022 2229   GLUCOSEU NEGATIVE 03/10/2022 2229   HGBUR NEGATIVE 03/10/2022 2229   BILIRUBINUR NEGATIVE 03/10/2022 2229   KETONESUR NEGATIVE 03/10/2022 2229   PROTEINUR 100 (A) 03/10/2022 2229   NITRITE NEGATIVE 03/10/2022 2229   LEUKOCYTESUR NEGATIVE 03/10/2022 2229    Radiological Exams on Admission: CT Head Wo Contrast  Result Date: 03/28/2023 CLINICAL DATA:  Head trauma, minor (Age >= 65y) EXAM: CT HEAD WITHOUT CONTRAST TECHNIQUE: Contiguous axial images were obtained from the base of the skull through the vertex without intravenous contrast. RADIATION DOSE REDUCTION: This exam was performed according to the departmental dose-optimization program which includes automated exposure control, adjustment of the mA and/or kV according to patient size and/or use of iterative reconstruction technique. COMPARISON:  CT Head 02/22/22 FINDINGS: Brain: No hemorrhage. No hydrocephalus. No extra-axial fluid collection. No CT evidence of an acute cortical infarct. No mass effect. No mass lesion. Background of moderate chronic microvascular ischemic change. Vascular: No hyperdense vessel or unexpected calcification. Skull: Mild soft swelling along the right parietal scalp. No evidence of an underlying calvarial fracture. Sinuses/Orbits: No middle ear or mastoid effusion. Mucosal thickening of the floor of bilateral maxillary sinuses. Bilateral lens replacement. Orbits are otherwise unremarkable Other: None. IMPRESSION: No CT evidence of intracranial injury Electronically Signed   By: Lorenza Cambridge M.D.   On: 03/28/2023 12:59   DG Chest 2 View  Result Date: 03/28/2023 CLINICAL DATA:  Fall.  Tachypnea EXAM: CHEST - 2 VIEW COMPARISON:  X-ray 03/11/2022 and older. FINDINGS: Enlarged cardiopericardial silhouette. Vascular congestion. No interstitial edema clearly seen today. No pneumothorax or effusion. Films are under penetrated. IMPRESSION: Underinflated and under  penetrated limited radiograph with enlarged cardiac silhouette. Central vascular congestion. Electronically Signed   By: Karen Kays M.D.   On: 03/28/2023 12:57    EKG: Independently reviewed.  Sinus rhythm at 63 bpm.  Nonspecific T wave flattening.  Low voltage multiple leads.  Assessment/Plan Principal Problem:   Hypoglycemia Active Problems:   Essential hypertension   CKD (chronic kidney disease)   Renal transplant, status post   HLD (hyperlipidemia)   Hypothyroidism   Anemia   DM2 (diabetes mellitus, type 2) (HCC)   Chronic diastolic CHF (congestive heart failure) (HCC)   Hypoglycemia Near syncope Diabetes > Patient presenting with some persistent hypoglycemia and near syncope event.  > Near syncope believed to be secondary to his hypoglycemia which was in the 40s. > Has had some lightheadedness today with his hypoglycemia and had near syncope where he lowered himself to the ground. > Had recurrent hypoglycemia into the 40s with EMS and in the ED now improved after multiple doses of D50 and now on D5 infusion.  > Some concern in the ED for adrenal insufficiency as he ran out of his prednisone on Tuesday and has not had it since that time so patient did receive dose of Solu-Cortef in the ED, blood pressure remained stable/elevated.  Will check a.m. cortisol. > Has had episodes of hypoglycemia in the past with his insulin regimen and worsening renal function.  Is prescribed a glucagon pen.  > Current insulin regimen is insulin pump which he has not used boluses from for the past couple days. - Monitor on telemetry overnight - Continue with D5 infusion - Trend blood sugar every 2 hours for now -  Hold all insulin - If blood sugar is persistently elevated will stop D5 infusion and continue with CBG trend, if CBG trend then remains persistently elevated off of D5 can start sliding scale - A.m. cortisol  Hypertension - Continue home amlodipine, carvedilol, hydralazine,  Lasix  Hyperlipidemia - Continue atorvastatin  Hypothyroidism - Continue home Synthroid  CKD 4-5 > Patient on the borderline between CKD 4-5.  Creatinine 4.3, which is near baseline which is 3.9-4.1. > History of renal transplant 2010 - Trend renal function and electrolytes - Continue home tacrolimus, mycophenolate, prednisone  Chronic diastolic CHF and right heart failure > Last echo 23 with EF 55-60%, G2 DD, mildly reduced RV function. > Some possible vascular congestion on chest x-ray, but no shortness of breath will continue with home medications. - Continue home Lasix, Coreg  History hepatitis C - Status post treatment with Mavyret   Chorea > Believed to be toxic/metabolic versus tardive dyskinesia - Continue home [place]  DVT prophylaxis: Heparin Code Status:   DNR Family Communication:  Updated at bedside Disposition Plan:   Patient is from:  Home  Anticipated DC to:  Home  Anticipated DC date:  1 to 2 days  Anticipated DC barriers: None  Consults called:  None Admission status:  Observation, telemetry  Severity of Illness: The appropriate patient status for this patient is OBSERVATION. Observation status is judged to be reasonable and necessary in order to provide the required intensity of service to ensure the patient's safety. The patient's presenting symptoms, physical exam findings, and initial radiographic and laboratory data in the context of their medical condition is felt to place them at decreased risk for further clinical deterioration. Furthermore, it is anticipated that the patient will be medically stable for discharge from the hospital within 2 midnights of admission.    Synetta Fail MD [facility]  How to contact the [facility] Attending or Consulting provider 7A - 7P or covering provider during after hours 7P -7A, for this patient?   Check the care team in [facility] and look for a) attending/consulting [facility] provider listed and b) the [facility] team  listed Log into [email or web address] and use [place]'s universal password to access. If you do not have the password, please contact the hospital operator. Locate the [facility] provider you are looking for under [facility] and page to a number that you can be directly reached. If you still have difficulty reaching the provider, please page the [facility] (Director on Call) for the Hospitalists listed on amion for assistance.  03/28/2023, 3:47 PM

## 2023-03-28 NOTE — Inpatient Diabetes Management (Signed)
Inpatient Diabetes Program Recommendations  AACE/ADA: New Consensus Statement on Inpatient Glycemic Control (2015)  Target Ranges:  Prepandial:   less than 140 mg/dL      Peak postprandial:   less than 180 mg/dL (1-2 hours)      Critically ill patients:  140 - 180 mg/dL   Lab Results  Component Value Date   GLUCAP 77 03/28/2023   HGBA1C 5.7 (H) 02/22/2022    Review of Glycemic Control  Latest Reference Range & Units 03/28/23 12:12 03/28/23 13:28  Glucose-Capillary 70 - 99 mg/dL 42 (LL) 77  (LL): Data is critically low  Diabetes history: DM2  Outpatient Diabetes medications:  Omnipod Dash with Dexcom G7 and Novolog  He only boluses for carbohydrates/no basal   Current orders for Inpatient glycemic control: none  Spoke with patient and spouse at bedside.  He is current with PCP, Dr. Dwana Melena in [place].  Dr. Margo Aye manages Mr. Lablanc's insulin pump.  Patient states he only uses his insulin pump to bolus for carbohydrates and does not receive basal through his pump.    Wife states he has had several low blood glucose episodes lately and has fallen because of these lows.  This morning he was going to go eat breakfast with his sister and his wife was alerted on her phone by the Dexcom CGM that his glucose was 52 mg/dL.  She gave him an oatmeal cookie and it came up to 114 mg/dL.    Called and left a message with Dr. Scharlene Gloss nurse asking her to call me back and provide insulin pump settings as he left the controller for his pump at home which has pump settings.  Will update note once I hear back from he.    Insulin pump has been removed by spouse.    Will continue to follow while inpatient.    Thank you, Dulce Sellar, MSN, CDCES Diabetes Coordinator Inpatient Diabetes Program [phone] (team pager from 8a-5p)

## 2023-03-28 NOTE — Plan of Care (Signed)

## 2023-03-28 NOTE — ED Notes (Signed)
ED TO INPATIENT HANDOFF REPORT  ED Nurse Name and Phone #: Murlean Iba Paramedic [phone]  S Name/Age/Gender Nathan Tyler 67 y.o. male Room/Bed: H020C/H020C  Code Status   Code Status: Full Code  Home/SNF/Other Home Patient oriented to: self, place, time, and situation Is this baseline? Yes   Triage Complete: Triage complete  Chief Complaint Hypoglycemia [E16.2]  Triage Note Pt to ED via [facility] from home. Pt woke up this am and cbg = 40s and he felt lightheaded. He ate and continued to feel lightheaded and had a near syncope event. Pt fell against the wall and then landed on floor. Pt denies injuries. Pt continues to feel lightheaded.  Pt has had multiple falls over past few weeks.   170/80, 130/94 Cbg=113   Allergies No Known Allergies  Level of Care/Admitting Diagnosis ED Disposition     ED Disposition  Admit   Condition  --   Comment  Hospital Area: [facility] [[id number]]  Level of Care: Telemetry Medical [104]  May place patient in observation at [facility] or [facility] if equivalent level of care is available:: No  Covid Evaluation: Asymptomatic - no recent exposure (last 10 days) testing not required  Diagnosis: Hypoglycemia [242204]  Admitting Physician: Synetta Fail [[id number]]  Attending Physician: Synetta Fail [[id number]]          B Medical/Surgery History Past Medical History:  Diagnosis Date   Acute metabolic encephalopathy 02/22/2022   Acute renal failure superimposed on stage 4 chronic kidney disease (HCC) 02/22/2022   Acute respiratory failure with hypoxia (HCC) 06/03/2019   Anemia    Takes iron shots once a month   Cancer (HCC)    eye - meleonoma   Chorea    COVID-19    2021   COVID-19 virus infection 05/31/2019   Diabetes mellitus without complication (HCC)    type 2   Dyspnea    Essential hypertension    Hepatitis    hep C   Hepatitis C antibody positive in blood 03/12/2017   Hypercholesteremia     Hypothyroidism    Neuromuscular disorder (HCC)    Pneumonia    PONV (postoperative nausea and vomiting)    Renal failure    Has had kidney transplant 2008   Sleep apnea    Mild. Does not require CPAP use   Past Surgical History:  Procedure Laterality Date   AMPUTATION Right 06/27/2016   Procedure: AMPUTATION 5TH TOE RIGHT FOOT;  Surgeon: Ferman Hamming, DPM;  Location: [facility];  Service: Podiatry;  Laterality: Right;   AMPUTATION TOE Left 09/02/2015   Procedure: AMPUTATION  2ND TOE LEFT FOOT;  Surgeon: Franky Macho, MD;  Location: [facility];  Service: General;  Laterality: Left;   AV FISTULA PLACEMENT Right 06/27/2022   Procedure: RIGHT BRACHIOCEPHALIC ARTERIOVENOUS (AV) FISTULA CREATION;  Surgeon: Leonie Douglas, MD;  Location: [facility] OR;  Service: Vascular;  Laterality: Right;   BASCILIC VEIN TRANSPOSITION Right 09/10/2022   Procedure: RIGHT UPPER ARM SECOND STAGE BRACHIAL CEPHALIC VEIN TRANSPOSITION;  Surgeon: Leonie Douglas, MD;  Location: [facility] OR;  Service: Vascular;  Laterality: Right;   BIOPSY  11/24/2021   Procedure: BIOPSY;  Surgeon: Corbin Ade, MD;  Location: [facility];  Service: Endoscopy;;   CATARACT EXTRACTION Bilateral    COLONOSCOPY  01/2014   done in [place]. patient reports h/o colon polyps on TCS in 2010, 2015 colonoscopy normal but advised to come back in 2020.    COLONOSCOPY WITH  PROPOFOL N/A 01/19/2019   Procedure: COLONOSCOPY WITH PROPOFOL;  Surgeon: Corbin Ade, MD;  Location: [facility];  Service: Endoscopy;  Laterality: N/A;  9:45am   COLONOSCOPY WITH PROPOFOL N/A 11/24/2021   pancolonic diverticulosis, three polyps, adenomas, negative microscopic colitis   IR FLUORO GUIDE CV LINE RIGHT  03/14/2022   IR PERC TUN PERIT CATH WO PORT S&I /IMAG  03/14/2022   IR REMOVAL TUN CV CATH W/O FL  03/19/2022   IR US GUIDE VASC ACCESS RIGHT  03/14/2022   KIDNEY TRANSPLANT Right 2010   POLYPECTOMY  01/19/2019   Procedure: POLYPECTOMY;  Surgeon: Corbin Ade,  MD;  Location: [facility];  Service: Endoscopy;;   POLYPECTOMY  11/24/2021   Procedure: POLYPECTOMY;  Surgeon: Corbin Ade, MD;  Location: [facility];  Service: Endoscopy;;     A IV Location/Drains/Wounds Patient Lines/Drains/Airways Status     Active Line/Drains/Airways     Name Placement date Placement time Site Days   Peripheral IV 03/28/23 20 G 1" Anterior;Left Forearm 03/28/23  1330  Forearm  less than 1   Fistula / Graft Right Upper arm Arteriovenous fistula 06/27/22  --  Upper arm  274            Intake/Output Last 24 hours No intake or output data in the 24 hours ending 03/28/23 1631  Labs/Imaging Results for orders placed or performed during the hospital encounter of 03/28/23 (from the past 48 hour(s))  Comprehensive metabolic panel     Status: Abnormal   Collection Time: 03/28/23 11:04 AM  Result Value Ref Range   Sodium 141 135 - 145 mmol/L   Potassium 4.2 3.5 - 5.1 mmol/L   Chloride 112 (H) 98 - 111 mmol/L   CO2 19 (L) 22 - 32 mmol/L   Glucose, Bld 83 70 - 99 mg/dL    Comment: Glucose reference range applies only to samples taken after fasting for at least 8 hours.   BUN 87 (H) 8 - 23 mg/dL   Creatinine, Ser 1.61 (H) 0.61 - 1.24 mg/dL   Calcium 8.4 (L) 8.9 - 10.3 mg/dL   Total Protein 5.7 (L) 6.5 - 8.1 g/dL   Albumin 3.2 (L) 3.5 - 5.0 g/dL   AST 15 15 - 41 U/L   ALT 8 0 - 44 U/L   Alkaline Phosphatase 79 38 - 126 U/L   Total Bilirubin 0.6 <1.2 mg/dL   GFR, Estimated 14 (L) >60 mL/min    Comment: (NOTE) Calculated using the CKD-EPI Creatinine Equation (2021)    Anion gap 10 5 - 15    Comment: Performed at [facility], [address], [place], [place] [address]  CBC     Status: Abnormal   Collection Time: 03/28/23 11:04 AM  Result Value Ref Range   WBC 6.9 4.0 - 10.5 K/uL   RBC 3.50 (L) 4.22 - 5.81 MIL/uL   Hemoglobin 10.1 (L) 13.0 - 17.0 g/dL   HCT 54.0 (L) 98.1 - 19.1 %   MCV 92.9 80.0 - 100.0 fL   MCH 28.9 26.0 - 34.0 pg   MCHC  31.1 30.0 - 36.0 g/dL   RDW 47.8 29.5 - 62.1 %   Platelets 307 150 - 400 K/uL   nRBC 0.0 0.0 - 0.2 %    Comment: Performed at [facility], [address], [place], [place] [address]  Troponin I (High Sensitivity)     Status: None   Collection Time: 03/28/23 11:04 AM  Result Value Ref Range   Troponin I (High Sensitivity) 12 <18 ng/L    Comment: (NOTE) Elevated high sensitivity troponin I (hsTnI) values and significant  changes across serial measurements may suggest ACS but many other  chronic and acute conditions are known to elevate hsTnI results.  Refer to the "Links" section for chest pain algorithms and additional  guidance. Performed at [facility], [address], [place], [place] [address]   CBG monitoring, ED (now and then every hour for 3 hours)     Status: Abnormal   Collection Time: 03/28/23 12:12 PM  Result Value Ref Range   Glucose-Capillary 42 (LL) 70 - 99 mg/dL    Comment: Glucose reference range applies only to samples taken after fasting for at least 8 hours.   Comment 1 Notify RN   CBG monitoring, ED (now and then every hour for 3 hours)     Status: None   Collection Time: 03/28/23  1:28 PM  Result Value Ref Range   Glucose-Capillary 77 70 - 99 mg/dL    Comment: Glucose reference range applies only to samples taken after fasting for at least 8 hours.   CT Head Wo Contrast  Result Date: 03/28/2023 CLINICAL DATA:  Head trauma, minor (Age >= 65y) EXAM: CT HEAD WITHOUT CONTRAST TECHNIQUE: Contiguous axial images were obtained from the base of the skull through the vertex without intravenous contrast. RADIATION DOSE REDUCTION: This exam was performed according to the departmental dose-optimization program which includes automated exposure control, adjustment of the mA and/or kV according to patient size and/or use of iterative reconstruction technique. COMPARISON:  CT Head 02/22/22 FINDINGS: Brain: No hemorrhage. No hydrocephalus. No extra-axial fluid  collection. No CT evidence of an acute cortical infarct. No mass effect. No mass lesion. Background of moderate chronic microvascular ischemic change. Vascular: No hyperdense vessel or unexpected calcification. Skull: Mild soft swelling along the right parietal scalp. No evidence of an underlying calvarial fracture. Sinuses/Orbits: No middle ear or mastoid effusion. Mucosal thickening of the floor of bilateral maxillary sinuses. Bilateral lens replacement. Orbits are otherwise unremarkable Other: None. IMPRESSION: No CT evidence of intracranial injury Electronically Signed   By: Lorenza Cambridge M.D.   On: 03/28/2023 12:59   DG Chest 2 View  Result Date: 03/28/2023 CLINICAL DATA:  Fall.  Tachypnea EXAM: CHEST - 2 VIEW COMPARISON:  X-ray 03/11/2022 and older. FINDINGS: Enlarged cardiopericardial silhouette. Vascular congestion. No interstitial edema clearly seen today. No pneumothorax or effusion. Films are under penetrated. IMPRESSION: Underinflated and under penetrated limited radiograph with enlarged cardiac silhouette. Central vascular congestion. Electronically Signed   By: Karen Kays M.D.   On: 03/28/2023 12:57    Pending Labs Unresulted Labs (From admission, onward)     Start     Ordered   03/29/23 0500  Comprehensive metabolic panel  Tomorrow morning,   R        03/28/23 1545   03/29/23 0500  CBC  Tomorrow morning,   R        03/28/23 1545   03/28/23 1546  Cortisol-am, blood  Once,   R        03/28/23 1545   03/28/23 1540  HIV Antibody (routine testing w rflx)  (HIV Antibody (Routine testing w reflex) panel)  Once,   R        03/28/23 1545   03/28/23 1112  TSH  Once,   URGENT        03/28/23 1112   03/28/23 1104  Urinalysis, Routine w reflex microscopic -Urine, Clean Catch  Once,   URGENT       Question:  Specimen Source  Answer:  Urine, Clean Catch   03/28/23 1104            Vitals/Pain Today's Vitals   03/28/23 1052 03/28/23 1103  BP: (!) 153/60   Pulse: (!) 59   Resp: 20    Temp: 97.7 F (36.5 C)   TempSrc: Oral   SpO2: 100%   Weight:  214 lb (97.1 kg)  Height:  5\' 11"  (1.803 m)  PainSc:  0-No pain    Isolation Precautions No active isolations  Medications Medications  levothyroxine (SYNTHROID) tablet 137 mcg (has no administration in time range)  predniSONE (DELTASONE) tablet 5 mg (has no administration in time range)  Deutetrabenazine TABS 6 mg (has no administration in time range)  mycophenolate (MYFORTIC) EC tablet 180 mg (has no administration in time range)  tacrolimus (PROGRAF) capsule 1.5 mg (has no administration in time range)  hydrALAZINE (APRESOLINE) tablet 50 mg (has no administration in time range)  furosemide (LASIX) tablet 40-80 mg (has no administration in time range)  carvedilol (COREG) tablet 6.25 mg (has no administration in time range)  atorvastatin (LIPITOR) tablet 10 mg (has no administration in time range)  amLODipine (NORVASC) tablet 10 mg (has no administration in time range)  rifaximin (XIFAXAN) tablet 550 mg (has no administration in time range)  heparin injection 5,000 Units (has no administration in time range)  sodium chloride flush (NS) 0.9 % injection 3 mL (has no administration in time range)  acetaminophen (TYLENOL) tablet 650 mg (has no administration in time range)    Or  acetaminophen (TYLENOL) suppository 650 mg (has no administration in time range)  polyethylene glycol (MIRALAX / GLYCOLAX) packet 17 g (has no administration in time range)  traMADol (ULTRAM) tablet 50 mg (has no administration in time range)  oxyCODONE (Oxy IR/ROXICODONE) immediate release tablet 5 mg (has no administration in time range)  dextrose 5 % solution (has no administration in time range)  hydrocortisone sodium succinate (SOLU-CORTEF) 100 MG injection 100 mg (100 mg Intravenous Given 03/28/23 1354)  dextrose 5 % solution ( Intravenous New Bag/Given 03/28/23 1354)    Mobility walks with person assist     Focused Assessments      R Recommendations: See Admitting Provider Note  Report given to:   Additional Notes:

## 2023-03-28 NOTE — ED Provider Notes (Addendum)
[place] EMERGENCY DEPARTMENT AT [facility] Provider Note   CSN: [id number] Arrival date & time: 03/28/23  1044     History  Chief Complaint  Patient presents with   Hypoglycemia   Weakness    Nathan Tyler is a 67 y.o. male.  HPI 67 year old male with a history of hypothyroidism, Parkinson's, Huntington's chorea, CKD 4 with living donor renal transplant in 2010, progressive CKD, OSA not on CPAP, anemia presents to the ER with hypoglycemia near syncope.  Patient states that he woke up this morning and noted that his blood sugar was in the 40s and he felt lightheaded.  He ate a cookie and his blood sugar improved to 91, and he got up out of bed.  However persistently felt dizzy, and had a near syncopal event.  He caught himself and was able to lower himself to the ground, did not his head or lose consciousness.  He feels slightly improved now that he has arrived to the emergency department.  Wife at bedside states that he has had several falls over the last week.  He states that he has been otherwise in his normal state of health.  Denies any shortness of breath, chest pain, back pain, lower extremity edema, fever or chills.    Home Medications Prior to Admission medications   Medication Sig Start Date End Date Taking? Authorizing Provider  amLODipine (NORVASC) 10 MG tablet Take 10 mg by mouth in the morning. 12/28/21   [provider]  atorvastatin (LIPITOR) 10 MG tablet Take 10 mg by mouth in the morning.    [provider]  carvedilol (COREG) 6.25 MG tablet Take 6.25 mg by mouth 2 (two) times daily with a meal.    [provider]  Deutetrabenazine (AUSTEDO) 6 MG TABS Take 6 mg by mouth 2 (two) times daily.    [provider]  furosemide (LASIX) 80 MG tablet Take 1 tablet (80 mg total) by mouth 2 (two) times daily. Patient taking differently: Take 40-80 mg by mouth See admin instructions. Take 80 mg in the morning and 40 mg at night 03/27/22  09/05/24  Kc, Dayna Barker, MD  GVOKE HYPOPEN 2-PACK 1 MG/0.2ML SOAJ Inject 1 Dose as directed as needed (SUGARS SEVERELY LOW LESS THAN 55 NOT RESPONDING TO SUGAR INTAKE). 04/27/22   [provider]  hydrALAZINE (APRESOLINE) 50 MG tablet Take 50 mg by mouth 3 (three) times daily. 02/06/22   [provider]  insulin aspart (NOVOLOG) 100 UNIT/ML injection Inject into the skin continuous. Uses in omnipod insulin pump    [provider]  Insulin Disposable Pump (OMNIPOD DASH PODS, GEN 4,) MISC  02/19/22   [provider]  levothyroxine (SYNTHROID) 125 MCG tablet Take 125 mcg by mouth daily before breakfast. 02/20/22   [provider]  mycophenolate (MYFORTIC) 180 MG EC tablet Take 1 tablet (180 mg total) by mouth 2 (two) times daily. 02/28/22   Marinda Elk, MD  OVER THE COUNTER MEDICATION Dexacon G 7 glucose monitor    [provider]  oxyCODONE (OXY IR/ROXICODONE) 5 MG immediate release tablet Take by mouth. 12/29/22   [provider]  predniSONE (DELTASONE) 5 MG tablet Take 1 tablet (5 mg total) by mouth daily with breakfast. restart 5mg  dose 3/10 after completing prednisone taper (2/24 - 3/9) 06/16/19   Tyrone Nine, MD  rifaximin (XIFAXAN) 550 MG TABS tablet Take 1 tablet (550 mg total) by mouth 3 (three) times daily. 03/12/23   Rourk,  Gerrit Friends, MD  sodium bicarbonate 650 MG tablet Take 650 mg by mouth 2 (two) times daily. 11/18/21   [provider]  tacrolimus (PROGRAF) 0.5 MG capsule Take 1.5 mg by mouth 2 (two) times daily.     [provider]  traMADol (ULTRAM) 50 MG tablet Take 1 tablet (50 mg total) by mouth every 6 (six) hours as needed. 09/10/22 09/10/23  Lars Mage, PA-C  valACYclovir (VALTREX) 1000 MG tablet Take 1,000 mg by mouth daily. 02/04/23   [provider]      Allergies    Patient has no known allergies.    Review of Systems   Review of Systems Ten systems reviewed and are negative for  acute change, except as noted in the HPI.   Physical Exam Updated Vital Signs BP (!) 153/60 (BP Location: Right Arm)   Pulse (!) 59   Temp 97.7 F (36.5 C) (Oral)   Resp 20   Ht 5\' 11"  (1.803 m)   Wt 97.1 kg   SpO2 100%   BMI 29.85 kg/m  Physical Exam Vitals and nursing note reviewed.  Constitutional:      General: He is not in acute distress.    Appearance: He is well-developed.  HENT:     Head: Normocephalic and atraumatic.  Eyes:     Conjunctiva/sclera: Conjunctivae normal.  Cardiovascular:     Rate and Rhythm: Normal rate and regular rhythm.     Heart sounds: No murmur heard. Pulmonary:     Effort: Pulmonary effort is normal. No respiratory distress.     Breath sounds: Normal breath sounds.     Comments: Slightly pursed lip breathing, patient cannot tell me this is new or chronic Abdominal:     Palpations: Abdomen is soft.     Tenderness: There is no abdominal tenderness.  Musculoskeletal:        General: No swelling.     Cervical back: Neck supple.     Comments: No cervical spine tenderness  Skin:    General: Skin is warm and dry.     Capillary Refill: Capillary refill takes less than 2 seconds.  Neurological:     Mental Status: He is alert.     Comments: Chorea-like movements in upper and lower extremities Mental Status:  Alert, thought content appropriate, able to give a coherent history. Speech fluent without evidence of aphasia. Able to follow 2 step commands without difficulty.  Cranial Nerves:  II:  Peripheral visual fields grossly normal, pupils equal, round, reactive to light III,IV, VI: ptosis not present, extra-ocular motions intact bilaterally  V,VII: smile symmetric, facial light touch sensation equal VIII: hearing grossly normal to voice  X: uvula elevates symmetrically  XI: bilateral shoulder shrug symmetric and strong XII: midline tongue extension without fassiculations Motor:  Normal tone. 5/5 strength of BUE and BLE major muscle groups  including strong and equal grip strength and dorsiflexion/plantar flexion Sensory: light touch normal in all extremities. Cerebellar: normal finger-to-nose with bilateral upper extremities, Romberg sign absent Gait: Deferred     Psychiatric:        Mood and Affect: Mood normal.     ED Results / Procedures / Treatments   Labs (all labs ordered are listed, but only abnormal results are displayed) Labs Reviewed  COMPREHENSIVE METABOLIC PANEL - Abnormal; Notable for the following components:      Result Value   Chloride 112 (*)    CO2 19 (*)    BUN 87 (*)  Creatinine, Ser 4.33 (*)    Calcium 8.4 (*)    Total Protein 5.7 (*)    Albumin 3.2 (*)    GFR, Estimated 14 (*)    All other components within normal limits  CBC - Abnormal; Notable for the following components:   RBC 3.50 (*)    Hemoglobin 10.1 (*)    HCT 32.5 (*)    All other components within normal limits  CBG MONITORING, ED - Abnormal; Notable for the following components:   Glucose-Capillary 42 (*)    All other components within normal limits  URINALYSIS, ROUTINE W REFLEX MICROSCOPIC  TSH  HIV ANTIBODY (ROUTINE TESTING W REFLEX)  CBG MONITORING, ED  CBG MONITORING, ED  TROPONIN I (HIGH SENSITIVITY)  TROPONIN I (HIGH SENSITIVITY)    EKG EKG Interpretation Date/Time:  Thursday March 28 2023 15:14:17 EST Ventricular Rate:  63 PR Interval:  150 QRS Duration:  84 QT Interval:  466 QTC Calculation: 476 R Axis:   69  Text Interpretation: Normal sinus rhythm Normal ECG When compared with ECG of 14-Mar-2022 12:27, PREVIOUS ECG IS PRESENT Confirmed by Gwyneth Sprout ([id number]) on 03/28/2023 3:39:42 PM  Radiology CT Head Wo Contrast  Result Date: 03/28/2023 CLINICAL DATA:  Head trauma, minor (Age >= 65y) EXAM: CT HEAD WITHOUT CONTRAST TECHNIQUE: Contiguous axial images were obtained from the base of the skull through the vertex without intravenous contrast. RADIATION DOSE REDUCTION: This exam was performed  according to the departmental dose-optimization program which includes automated exposure control, adjustment of the mA and/or kV according to patient size and/or use of iterative reconstruction technique. COMPARISON:  CT Head 02/22/22 FINDINGS: Brain: No hemorrhage. No hydrocephalus. No extra-axial fluid collection. No CT evidence of an acute cortical infarct. No mass effect. No mass lesion. Background of moderate chronic microvascular ischemic change. Vascular: No hyperdense vessel or unexpected calcification. Skull: Mild soft swelling along the right parietal scalp. No evidence of an underlying calvarial fracture. Sinuses/Orbits: No middle ear or mastoid effusion. Mucosal thickening of the floor of bilateral maxillary sinuses. Bilateral lens replacement. Orbits are otherwise unremarkable Other: None. IMPRESSION: No CT evidence of intracranial injury Electronically Signed   By: Lorenza Cambridge M.D.   On: 03/28/2023 12:59   DG Chest 2 View  Result Date: 03/28/2023 CLINICAL DATA:  Fall.  Tachypnea EXAM: CHEST - 2 VIEW COMPARISON:  X-ray 03/11/2022 and older. FINDINGS: Enlarged cardiopericardial silhouette. Vascular congestion. No interstitial edema clearly seen today. No pneumothorax or effusion. Films are under penetrated. IMPRESSION: Underinflated and under penetrated limited radiograph with enlarged cardiac silhouette. Central vascular congestion. Electronically Signed   By: Karen Kays M.D.   On: 03/28/2023 12:57    Procedures Procedures    Medications Ordered in ED Medications  levothyroxine (SYNTHROID) tablet 137 mcg (has no administration in time range)  predniSONE (DELTASONE) tablet 5 mg (has no administration in time range)  Deutetrabenazine TABS 6 mg (has no administration in time range)  mycophenolate (MYFORTIC) EC tablet 180 mg (has no administration in time range)  tacrolimus (PROGRAF) capsule 1.5 mg (has no administration in time range)  hydrALAZINE (APRESOLINE) tablet 50 mg (has no  administration in time range)  furosemide (LASIX) tablet 40-80 mg (has no administration in time range)  carvedilol (COREG) tablet 6.25 mg (has no administration in time range)  atorvastatin (LIPITOR) tablet 10 mg (has no administration in time range)  amLODipine (NORVASC) tablet 10 mg (has no administration in time range)  rifaximin (XIFAXAN) tablet 550 mg (has no administration in time  range)  traMADol (ULTRAM) tablet 50 mg (has no administration in time range)  oxyCODONE (Oxy IR/ROXICODONE) immediate release tablet 5 mg (has no administration in time range)  heparin injection 5,000 Units (has no administration in time range)  sodium chloride flush (NS) 0.9 % injection 3 mL (has no administration in time range)  acetaminophen (TYLENOL) tablet 650 mg (has no administration in time range)    Or  acetaminophen (TYLENOL) suppository 650 mg (has no administration in time range)  polyethylene glycol (MIRALAX / GLYCOLAX) packet 17 g (has no administration in time range)  hydrocortisone sodium succinate (SOLU-CORTEF) 100 MG injection 100 mg (100 mg Intravenous Given 03/28/23 1354)  dextrose 5 % solution ( Intravenous New Bag/Given 03/28/23 1354)    ED Course/ Medical Decision Making/ A&P Clinical Course as of 03/28/23 1545  Thu Mar 28, 2023  6266 67 year old male who presented with hypoglycemia and near syncope.  On arrival, vitals are stable, afebrile.  He reports feeling slightly better.  He has chronic chorea type movements in his upper and lower extremities.  Neuroexam overall reassuring.  He does have some mild pursed lip breathing, patient cannot tell me if this is new or chronic, however he denies any shortness of breath.  No evidence of gross volume overload.  No cervical spine tenderness. Labs ordered including CBC, CMP, TSH, troponin, UA  [MB]  1326 Glucose-Capillary(!!): 42 [MB]  1351 Patient persistently hypoglycemic after having orange juice and applesauce.  Will start on D5 drip.  He  does state that he is on prednisone for his kidney transplant, and he has not taken this since Tuesday as he ran out of his medications.  Has not used insulin pump since early this week. Query possible adrenal insufficiency as cause of persistent hypoglycemia.  Dr. Denton Lank ordered 100 mg of Solu-Cortef IV [MB]  1352 CT head negative, chest x-ray with some pulmonary vascular congestion. [MB]  1422 Creatinine(!): 4.33 Slightly elevated from baseline   [MB]  1430 Pt will need admission for further evaluation and monitoring for persistent hypoglycemia. Hospitalist paged for admission [MB]  1541 Discussed with hospitalist who will admit for further evaluation and treatment  [MB]    Clinical Course User Index [MB] Mare Ferrari, PA-C                                 Medical Decision Making Amount and/or Complexity of Data Reviewed Labs: ordered. Decision-making details documented in ED Course. Radiology: ordered.  Risk Prescription drug management. Decision regarding hospitalization.     Final Clinical Impression(s) / ED Diagnoses Final diagnoses:  Hypoglycemia    Rx / DC Orders ED Discharge Orders     None             Mare Ferrari, PA-C 03/28/23 1545    Cathren Laine, MD 03/29/23 1552

## 2023-03-29 DIAGNOSIS — E162 Hypoglycemia, unspecified: Secondary | ICD-10-CM | POA: Diagnosis not present

## 2023-03-29 DIAGNOSIS — E11649 Type 2 diabetes mellitus with hypoglycemia without coma: Secondary | ICD-10-CM | POA: Diagnosis not present

## 2023-03-29 LAB — CBC
HCT: 28.2 % — ABNORMAL LOW (ref 39.0–52.0)
Hemoglobin: 8.6 g/dL — ABNORMAL LOW (ref 13.0–17.0)
MCH: 27.7 pg (ref 26.0–34.0)
MCHC: 30.5 g/dL (ref 30.0–36.0)
MCV: 91 fL (ref 80.0–100.0)
Platelets: 292 10*3/uL (ref 150–400)
RBC: 3.1 MIL/uL — ABNORMAL LOW (ref 4.22–5.81)
RDW: 14.9 % (ref 11.5–15.5)
WBC: 5.9 10*3/uL (ref 4.0–10.5)
nRBC: 0 % (ref 0.0–0.2)

## 2023-03-29 LAB — GLUCOSE, CAPILLARY
Glucose-Capillary: 212 mg/dL — ABNORMAL HIGH (ref 70–99)
Glucose-Capillary: 203 mg/dL — ABNORMAL HIGH (ref 70–99)
Glucose-Capillary: 203 mg/dL — ABNORMAL HIGH (ref 70–99)

## 2023-03-29 LAB — COMPREHENSIVE METABOLIC PANEL
ALT: 8 U/L (ref 0–44)
AST: 11 U/L — ABNORMAL LOW (ref 15–41)
Albumin: 3 g/dL — ABNORMAL LOW (ref 3.5–5.0)
Alkaline Phosphatase: 71 U/L (ref 38–126)
Anion gap: 12 (ref 5–15)
BUN: 87 mg/dL — ABNORMAL HIGH (ref 8–23)
CO2: 17 mmol/L — ABNORMAL LOW (ref 22–32)
Calcium: 8 mg/dL — ABNORMAL LOW (ref 8.9–10.3)
Chloride: 106 mmol/L (ref 98–111)
Creatinine, Ser: 4.32 mg/dL — ABNORMAL HIGH (ref 0.61–1.24)
GFR, Estimated: 14 mL/min — ABNORMAL LOW (ref >60)
Glucose, Bld: 215 mg/dL — ABNORMAL HIGH (ref 70–99)
Potassium: 4.2 mmol/L (ref 3.5–5.1)
Sodium: 135 mmol/L (ref 135–145)
Total Bilirubin: 0.4 mg/dL (ref <1.2)
Total Protein: 5.4 g/dL — ABNORMAL LOW (ref 6.5–8.1)

## 2023-03-29 NOTE — Progress Notes (Addendum)
Discharge instructions reviewed with pt and his wife.  Copy of instructions given to pt. No change in pt's home medications except pt to adjust his basal insulin as per instructions--pt and wife aware of this and state they will make change, no new scripts. Pt getting dressed, wife at bedside and will go down to get the car after pt dressed.  Pt to be d/c'd via wheelchair with belongings, with wife and will be            escorted by staff/hospital volunteer.   Dawayne Ohair,RN SWOT

## 2023-03-29 NOTE — Care Management Obs Status (Cosign Needed)
MEDICARE OBSERVATION STATUS NOTIFICATION   Patient Details  Name: Nathan Tyler MRN: [id number] Date of Birth: 04-04-1956   Medicare Observation Status Notification Given:  Yes    Janae Bridgeman, RN 03/29/2023, 11:18 AM

## 2023-03-29 NOTE — Inpatient Diabetes Management (Addendum)
Inpatient Diabetes Program Recommendations  AACE/ADA: New Consensus Statement on Inpatient Glycemic Control (2015)  Target Ranges:  Prepandial:   less than 140 mg/dL      Peak postprandial:   less than 180 mg/dL (1-2 hours)      Critically ill patients:  140 - 180 mg/dL   Lab Results  Component Value Date   GLUCAP 203 (H) 03/29/2023   HGBA1C 5.7 (H) 02/22/2022    Review of Glycemic Control   Latest Reference Range & Units 03/28/23 23:39 03/29/23 04:41 03/29/23 08:49 03/29/23 11:44  Glucose-Capillary 70 - 99 mg/dL 161 (H) 096 (H) 045 (H) 203 (H)  (H): Data is abnormally high  Latest Reference Range & Units 03/29/23 05:33  GFR, Estimated >60 mL/min 14 (L)  (L): Data is abnormally low  Diabetes history: DM2   Outpatient Diabetes medications:  Omnipod Dash with Dexcom G7 and Novolog  He only boluses for carbohydrates/he states he does not receive basal insulin    Current orders for Inpatient glycemic control: CBGs  Inpatient Diabetes Program Recommendations:    Might consider:  Novolog 0-6 units TID  Waiting for spouse to come to hospital with Omnipod's PDM so I can review insulin pump settings.   Addendum@1412 :  Met with patient and spouse at bedside.  Reviewed PDM for his Omnipod.  Settings are as follows:  Basal-1 units/hr (24 units total daily) Insulin carb ratio: 1 units for every 15 carbs Insulin sensitivity factor: 1 unit drops him 50 mg/dL Target Glucose: 409 mg/dL (correction starts at 811)  Notified Dr. Jerral Ralph; will decrease pump settings for basal insulin to 0.5 units/hr (12 units total/day).  Patient should follow up with Dr. Dwana Melena in 1-2 weeks.  Call Dr. Margo Aye if consistently <100 mg/dL or > 914 mg/dL.    Attempted to change basal to 0.5 units/hr but since his pump is not here I cannot change the settings.  Reviewed with patient and spouse how to decrease basal settings to 0.5 units/hr.  They will place pump on when they get home and decrease basal.      Will continue to follow while inpatient.  Thank you, Dulce Sellar, MSN, CDCES Diabetes Coordinator Inpatient Diabetes Program [phone] (team pager from 8a-5p)

## 2023-03-29 NOTE — Progress Notes (Signed)
Transition of Care [facility]) - Inpatient Brief Assessment   Patient Details  Name: DANH POYSER MRN: [id number] Date of Birth: 1955/12/04  Transition of Care [facility]) CM/SW Contact:    Janae Bridgeman, RN Phone Number: 03/29/2023, 11:28 AM   Clinical Narrative: CM met with the patient at the bedside and patient was provided with Medicare Observation letter.  Patient lives at home with his wife and admitted for low blood sugar and syncopal episode.  Patient has DME at the home including Dex Com for diabetes management, RW, [place].  Patient has urine collection device on at this time to collect urine sample - Charge RN notified to assist the patient for toileting assistance and collection of urine sample.  No other TOC needs at this time.   Transition of Care Asessment: Insurance and Status: (P) Insurance coverage has been reviewed Patient has primary care physician: (P) Yes Home environment has been reviewed: (P) from home with wife Prior level of function:: (P) Independent - family assistance available Prior/Current Home Services: (P) No current home services Social Determinants of Health Reivew: (P) SDOH reviewed interventions complete Readmission risk has been reviewed: (P) Yes Transition of care needs: (P) no transition of care needs at this time

## 2023-03-29 NOTE — Discharge Summary (Signed)
Physician Discharge Summary  Nathan Tyler ZOX:[id number] DOB: [birth year] DOA: 03/28/2023  PCP: Benita Stabile, MD  Admit date: 03/28/2023 Discharge date: 03/29/2023  Admitted From: Home Disposition: Home  Recommendations for Outpatient Follow-up:  Follow up with PCP in 1-2 weeks Please obtain BMP/CBC in one week Follow-up with PCP.  Recommend decreasing basal insulin doses until follow-up.  Home Health: N/A Equipment/Devices: N/A  Discharge Condition: Stable CODE STATUS: Full code Diet recommendation: Low-salt diet, nutritional supplements, low-carb diet  Discharge summary: 67 year old with hypertension, hyperlipidemia, hypothyroidism, type 2 diabetes on insulin pump, CKD stage V status post renal transplant on multiple immunosuppressants including maintenance prednisone, Parkinson's disease admitted secondary to feeling lightheaded and blood glucose 42 and symptomatic hypoglycemia.  Apparently he is also suffering from early morning hypoglycemia.  Patient is currently on basal bolus insulin with insulin pump at home.  He takes 1 unit/h of basal insulin and NovoLog with carb count.   Symptomatic hypoglycemia secondary to insulin use: Likely decreased clearance from CKD.  Patient reported low blood sugars at home especially in the mornings.  He was admitted to the hospital overnight, even needed dextrose infusion to improve his blood sugars.  Patient was also missing doses of his prednisone for last 2 days, he was given a stress dose of steroids.  Overall improved.  Now blood sugars are more than 200 without additional support. -Discharging home with recommendations to decrease insulin to 0.5 units/h basal insulin and to continue carb count insulin doses.  Follow-up with primary care physician. All other investigations were negative.  He will continue medications.  He is back on prednisone 5 mg daily that is his maintenance prednisone doses. TSH, cortisol was normal.   Discharge Diagnoses:   Principal Problem:   Hypoglycemia Active Problems:   Essential hypertension   CKD (chronic kidney disease)   Renal transplant, status post   HLD (hyperlipidemia)   Hypothyroidism   Anemia   DM2 (diabetes mellitus, type 2) (HCC)   Chronic diastolic CHF (congestive heart failure) [facility])    Discharge Instructions  Discharge Instructions     Diet - low sodium heart healthy   Complete by: As directed    Discharge instructions   Complete by: As directed    Start half dose of basal insulin , 0.5 unit/ hour from your insulin pump and continue carb count insulin . Follow up with Dr Margo Aye   Increase activity slowly   Complete by: As directed    No wound care   Complete by: As directed       Allergies as of 03/29/2023   No Known Allergies      Medication List     TAKE these medications    amLODipine 10 MG tablet Commonly known as: NORVASC Take 10 mg by mouth in the morning.   atorvastatin 10 MG tablet Commonly known as: LIPITOR Take 10 mg by mouth in the morning.   Austedo 6 MG Tabs Generic drug: Deutetrabenazine Take 6 mg by mouth 2 (two) times daily.   carvedilol 6.25 MG tablet Commonly known as: COREG Take 6.25 mg by mouth 2 (two) times daily with a meal.   furosemide 80 MG tablet Commonly known as: LASIX Take 1 tablet (80 mg total) by mouth 2 (two) times daily. What changed:  how much to take when to take this additional instructions   Gvoke HypoPen 2-Pack 1 MG/0.2ML Soaj Generic drug: Glucagon Inject 1 Dose as directed as needed (SUGARS SEVERELY LOW LESS THAN 55 NOT RESPONDING  TO SUGAR INTAKE).   hydrALAZINE 50 MG tablet Commonly known as: APRESOLINE Take 50 mg by mouth 3 (three) times daily.   insulin aspart 100 UNIT/ML injection Commonly known as: novoLOG Inject into the skin continuous. Uses in omnipod insulin pump   levothyroxine 125 MCG tablet Commonly known as: SYNTHROID Take 125 mcg by mouth daily before breakfast.   mycophenolate 180 MG  EC tablet Commonly known as: MYFORTIC Take 1 tablet (180 mg total) by mouth 2 (two) times daily.   Omnipod DASH Pods (Gen 4) Misc   predniSONE 5 MG tablet Commonly known as: DELTASONE Take 1 tablet (5 mg total) by mouth daily with breakfast. restart 5mg  dose 3/10 after completing prednisone taper (2/24 - 3/9)   rifaximin 550 MG Tabs tablet Commonly known as: XIFAXAN Take 1 tablet (550 mg total) by mouth 3 (three) times daily.   tacrolimus 0.5 MG capsule Commonly known as: PROGRAF Take 1.5 mg by mouth 2 (two) times daily.        No Known Allergies  Consultations: None   Procedures/Studies: CT Head Wo Contrast  Result Date: 03/28/2023 CLINICAL DATA:  Head trauma, minor (Age >= 65y) EXAM: CT HEAD WITHOUT CONTRAST TECHNIQUE: Contiguous axial images were obtained from the base of the skull through the vertex without intravenous contrast. RADIATION DOSE REDUCTION: This exam was performed according to the departmental dose-optimization program which includes automated exposure control, adjustment of the mA and/or kV according to patient size and/or use of iterative reconstruction technique. COMPARISON:  CT Head 02/22/22 FINDINGS: Brain: No hemorrhage. No hydrocephalus. No extra-axial fluid collection. No CT evidence of an acute cortical infarct. No mass effect. No mass lesion. Background of moderate chronic microvascular ischemic change. Vascular: No hyperdense vessel or unexpected calcification. Skull: Mild soft swelling along the right parietal scalp. No evidence of an underlying calvarial fracture. Sinuses/Orbits: No middle ear or mastoid effusion. Mucosal thickening of the floor of bilateral maxillary sinuses. Bilateral lens replacement. Orbits are otherwise unremarkable Other: None. IMPRESSION: No CT evidence of intracranial injury Electronically Signed   By: Lorenza Cambridge M.D.   On: 03/28/2023 12:59   DG Chest 2 View  Result Date: 03/28/2023 CLINICAL DATA:  Fall.  Tachypnea EXAM:  CHEST - 2 VIEW COMPARISON:  X-ray 03/11/2022 and older. FINDINGS: Enlarged cardiopericardial silhouette. Vascular congestion. No interstitial edema clearly seen today. No pneumothorax or effusion. Films are under penetrated. IMPRESSION: Underinflated and under penetrated limited radiograph with enlarged cardiac silhouette. Central vascular congestion. Electronically Signed   By: Karen Kays M.D.   On: 03/28/2023 12:57   (Echo, Carotid, EGD, Colonoscopy, ERCP)    Subjective: Patient seen and examined.  Sister was at the bedside on my morning rounds.  Denied any complaints.  They complained of low blood sugars in the morning less than 80 at times.  Patient is eager to go home.  He was seen by diabetic coordinator, insulin pump adjustments were recommended and patient is comfortable with plan to go home.   Discharge Exam: Vitals:   03/29/23 1056 03/29/23 1302  BP: (!) 142/59 (!) 147/61  Pulse: 63 64  Resp:  17  Temp:  98.3 F (36.8 C)  SpO2:  97%   Vitals:   03/29/23 0810 03/29/23 0900 03/29/23 1056 03/29/23 1302  BP:  134/64 (!) 142/59 (!) 147/61  Pulse: 63  63 64  Resp: 19   17  Temp: 98 F (36.7 C)   98.3 F (36.8 C)  TempSrc: Oral   Oral  SpO2: 96%  97%  Weight:      Height:        General: Pt is alert, awake, not in acute distress Slow to respond.  At his baseline. Cardiovascular: RRR, S1/S2 +, no rubs, no gallops Respiratory: CTA bilaterally, no wheezing, no rhonchi Abdominal: Soft, NT, ND, bowel sounds + Extremities: no edema, no cyanosis    The results of significant diagnostics from this hospitalization (including imaging, microbiology, ancillary and laboratory) are listed below for reference.     Microbiology: No results found for this or any previous visit (from the past 240 hour(s)).   Labs: BNP (last 3 results) No results for input(s): "BNP" in the last 8760 hours. Basic Metabolic Panel: Recent Labs  Lab 03/28/23 1104 03/29/23 0533  NA 141 135  K 4.2  4.2  CL 112* 106  CO2 19* 17*  GLUCOSE 83 215*  BUN 87* 87*  CREATININE 4.33* 4.32*  CALCIUM 8.4* 8.0*   Liver Function Tests: Recent Labs  Lab 03/28/23 1104 03/29/23 0533  AST 15 11*  ALT 8 8  ALKPHOS 79 71  BILITOT 0.6 0.4  PROT 5.7* 5.4*  ALBUMIN 3.2* 3.0*   No results for input(s): "LIPASE", "AMYLASE" in the last 168 hours. No results for input(s): "AMMONIA" in the last 168 hours. CBC: Recent Labs  Lab 03/28/23 1104 03/29/23 0533  WBC 6.9 5.9  HGB 10.1* 8.6*  HCT 32.5* 28.2*  MCV 92.9 91.0  PLT 307 292   Cardiac Enzymes: No results for input(s): "CKTOTAL", "CKMB", "CKMBINDEX", "TROPONINI" in the last 168 hours. BNP: Invalid input(s): "POCBNP" CBG: Recent Labs  Lab 03/28/23 2150 03/28/23 2339 03/29/23 0441 03/29/23 0849 03/29/23 1144  GLUCAP 379* 305* 212* 203* 203*   D-Dimer No results for input(s): "DDIMER" in the last 72 hours. Hgb A1c No results for input(s): "HGBA1C" in the last 72 hours. Lipid Profile No results for input(s): "CHOL", "HDL", "LDLCALC", "TRIG", "CHOLHDL", "LDLDIRECT" in the last 72 hours. Thyroid function studies Recent Labs    03/28/23 1112  TSH 5.809*   Anemia work up No results for input(s): "VITAMINB12", "FOLATE", "FERRITIN", "TIBC", "IRON", "RETICCTPCT" in the last 72 hours. Urinalysis    Component Value Date/Time   COLORURINE YELLOW 03/10/2022 2229   APPEARANCEUR HAZY (A) 03/10/2022 2229   LABSPEC 1.013 03/10/2022 2229   PHURINE 5.0 03/10/2022 2229   GLUCOSEU NEGATIVE 03/10/2022 2229   HGBUR NEGATIVE 03/10/2022 2229   BILIRUBINUR NEGATIVE 03/10/2022 2229   KETONESUR NEGATIVE 03/10/2022 2229   PROTEINUR 100 (A) 03/10/2022 2229   NITRITE NEGATIVE 03/10/2022 2229   LEUKOCYTESUR NEGATIVE 03/10/2022 2229   Sepsis Labs Recent Labs  Lab 03/28/23 1104 03/29/23 0533  WBC 6.9 5.9   Microbiology No results found for this or any previous visit (from the past 240 hour(s)).   Time coordinating discharge: 32  minutes  SIGNED:   Dorcas Carrow, MD  [facility] 03/29/2023, 2:38 PM

## 2023-03-29 NOTE — Progress Notes (Signed)
Mobility Specialist Progress Note:    03/29/23 1154  Mobility  Activity Ambulated with assistance in hallway  Level of Assistance Contact guard assist, steadying assist  Assistive Device Front wheel walker  Distance Ambulated (ft) 200 ft  Activity Response Tolerated well  Mobility Referral Yes  Mobility visit 1 Mobility  Mobility Specialist Start Time (ACUTE ONLY) 1148  Mobility Specialist Stop Time (ACUTE ONLY) 1154  Mobility Specialist Time Calculation (min) (ACUTE ONLY) 6 min   Pt received in bed, eager for mobility session. Ambulated in hallway with RW and CGA for safety. Tolerated well, asx throughout. Returned pt to room, sitting up comfortably in chair, alarm on and call bell in reach.   Feliciana Rossetti Mobility Specialist Please contact via Special educational needs teacher or  Rehab office at [phone]

## 2023-04-03 ENCOUNTER — Encounter (HOSPITAL_COMMUNITY): Payer: Medicare Other

## 2023-04-11 NOTE — Unmapped (Signed)
[facility] Refill Coordination Note    Specialty Medication(s) to be Shipped:   Neurology: [name]    Other medication(s) to be shipped: No additional medications requested for fill at this time     Gary Rogers, DOB: 1955-12-18  Phone: [phone] (home)       All above HIPAA information was verified with patient.     Was a Nurse, learning disability used for this call? No    Completed refill call assessment today to schedule patient's medication shipment from the [facility]  [phone]).  All relevant notes have been reviewed.     Specialty medication(s) and dose(s) confirmed: Regimen is correct and unchanged.   Changes to medications: Gary Rogers reports no changes at this time.  Changes to insurance: No  New side effects reported not previously addressed with a pharmacist or physician: None reported  Questions for the pharmacist: No    Confirmed patient received a Conservation officer, historic buildings and a Surveyor, mining with first shipment. The patient will receive a drug information handout for each medication shipped and additional FDA Medication Guides as required.       DISEASE/MEDICATION-SPECIFIC INFORMATION        N/A    SPECIALTY MEDICATION ADHERENCE     Medication Adherence    Patient reported X missed doses in the last month: 0  Specialty Medication: AUSTEDO 6 mg Tab (deutetrabenazine)  Patient is on additional specialty medications: No  Patient is on more than two specialty medications: No  Any gaps in refill history greater than 2 weeks in the last 3 months: no  Demonstrates understanding of importance of adherence: yes              Were doses missed due to medication being on hold? No    AUSTEDO 6  mg: 14  days of medicine on hand       REFERRAL TO PHARMACIST     Referral to the pharmacist: Not needed      Gary Rogers     Shipping address confirmed in Epic.       Delivery Scheduled: Yes, Expected medication delivery date:  04/19/23.     Medication will be delivered via UPS to the prescription address in Epic WAM.    Gary Rogers   [facility]  Specialty Technician

## 2023-04-18 ENCOUNTER — Encounter (HOSPITAL_COMMUNITY)
Admission: RE | Admit: 2023-04-18 | Discharge: 2023-04-18 | Disposition: A | Discharge status: Home / Self Care | Payer: Medicare Other | Source: Ambulatory Visit | Attending: Nephrology | Admitting: Nephrology

## 2023-04-18 VITALS — BP 146/61 | HR 72 | Temp 98.5°F | Resp 18

## 2023-04-18 DIAGNOSIS — N183 Chronic kidney disease, stage 3 unspecified: Secondary | ICD-10-CM | POA: Diagnosis not present

## 2023-04-18 DIAGNOSIS — Z79890 Hormone replacement therapy (postmenopausal): Secondary | ICD-10-CM | POA: Diagnosis not present

## 2023-04-18 DIAGNOSIS — D631 Anemia in chronic kidney disease: Secondary | ICD-10-CM | POA: Insufficient documentation

## 2023-04-18 DIAGNOSIS — N185 Chronic kidney disease, stage 5: Secondary | ICD-10-CM | POA: Diagnosis not present

## 2023-04-18 LAB — RENAL FUNCTION PANEL
Albumin: 4 g/dL (ref 3.5–5.0)
Anion gap: 15 (ref 5–15)
BUN: 129 mg/dL — ABNORMAL HIGH (ref 8–23)
CO2: 18 mmol/L — ABNORMAL LOW (ref 22–32)
Calcium: 8.3 mg/dL — ABNORMAL LOW (ref 8.9–10.3)
Chloride: 107 mmol/L (ref 98–111)
Creatinine, Ser: 3.84 mg/dL — ABNORMAL HIGH (ref 0.61–1.24)
GFR, Estimated: 16 mL/min — ABNORMAL LOW (ref >60)
Glucose, Bld: 136 mg/dL — ABNORMAL HIGH (ref 70–99)
Phosphorus: 7.5 mg/dL — ABNORMAL HIGH (ref 2.5–4.6)
Potassium: 4.1 mmol/L (ref 3.5–5.1)
Sodium: 140 mmol/L (ref 135–145)

## 2023-04-18 LAB — CBC WITH DIFFERENTIAL/PLATELET
Abs Immature Granulocytes: 0.12 10*3/uL — ABNORMAL HIGH (ref 0.00–0.07)
Basophils Absolute: 0.1 10*3/uL (ref 0.0–0.1)
Basophils Relative: 1 %
Eosinophils Absolute: 0.1 10*3/uL (ref 0.0–0.5)
Eosinophils Relative: 2 %
HCT: 33.2 % — ABNORMAL LOW (ref 39.0–52.0)
Hemoglobin: 10.4 g/dL — ABNORMAL LOW (ref 13.0–17.0)
Immature Granulocytes: 2 %
Lymphocytes Relative: 36 %
Lymphs Abs: 2.8 10*3/uL (ref 0.7–4.0)
MCH: 28.5 pg (ref 26.0–34.0)
MCHC: 31.3 g/dL (ref 30.0–36.0)
MCV: 91 fL (ref 80.0–100.0)
Monocytes Absolute: 0.7 10*3/uL (ref 0.1–1.0)
Monocytes Relative: 9 %
Neutro Abs: 4 10*3/uL (ref 1.7–7.7)
Neutrophils Relative %: 50 %
Platelets: 294 10*3/uL (ref 150–400)
RBC: 3.65 MIL/uL — ABNORMAL LOW (ref 4.22–5.81)
RDW: 15 % (ref 11.5–15.5)
WBC: 7.7 10*3/uL (ref 4.0–10.5)
nRBC: 0 % (ref 0.0–0.2)

## 2023-04-18 LAB — POCT HEMOGLOBIN-HEMACUE: Hemoglobin: 10.5 g/dL — ABNORMAL LOW (ref 13.0–17.0)

## 2023-04-18 MED ORDER — EPOETIN ALFA 40000 UNIT/ML IJ SOLN
40000.0000 [IU] | INTRAMUSCULAR | Status: DC
  Administered 2023-04-18: 40000 [IU] via SUBCUTANEOUS

## 2023-04-18 MED ORDER — EPOETIN ALFA 40000 UNIT/ML IJ SOLN
INTRAMUSCULAR | Status: AC
  Filled 2023-04-18: qty 1

## 2023-04-18 MED FILL — AUSTEDO 6 MG TABLET: ORAL | 30 days supply | Qty: 60 | Fill #5

## 2023-04-30 DIAGNOSIS — E113293 Type 2 diabetes mellitus with mild nonproliferative diabetic retinopathy without macular edema, bilateral: Secondary | ICD-10-CM | POA: Diagnosis not present

## 2023-05-01 DIAGNOSIS — N2581 Secondary hyperparathyroidism of renal origin: Secondary | ICD-10-CM | POA: Diagnosis not present

## 2023-05-01 DIAGNOSIS — E872 Acidosis, unspecified: Secondary | ICD-10-CM | POA: Diagnosis not present

## 2023-05-01 DIAGNOSIS — Z79899 Other long term (current) drug therapy: Secondary | ICD-10-CM | POA: Diagnosis not present

## 2023-05-01 DIAGNOSIS — I770 Arteriovenous fistula, acquired: Secondary | ICD-10-CM | POA: Diagnosis not present

## 2023-05-01 DIAGNOSIS — E875 Hyperkalemia: Secondary | ICD-10-CM | POA: Diagnosis not present

## 2023-05-01 DIAGNOSIS — N185 Chronic kidney disease, stage 5: Secondary | ICD-10-CM | POA: Diagnosis not present

## 2023-05-01 DIAGNOSIS — E1122 Type 2 diabetes mellitus with diabetic chronic kidney disease: Secondary | ICD-10-CM | POA: Diagnosis not present

## 2023-05-01 DIAGNOSIS — N179 Acute kidney failure, unspecified: Secondary | ICD-10-CM | POA: Diagnosis not present

## 2023-05-01 DIAGNOSIS — I120 Hypertensive chronic kidney disease with stage 5 chronic kidney disease or end stage renal disease: Secondary | ICD-10-CM | POA: Diagnosis not present

## 2023-05-01 DIAGNOSIS — N189 Chronic kidney disease, unspecified: Secondary | ICD-10-CM | POA: Diagnosis not present

## 2023-05-01 DIAGNOSIS — D631 Anemia in chronic kidney disease: Secondary | ICD-10-CM | POA: Diagnosis not present

## 2023-05-01 DIAGNOSIS — Z940 Kidney transplant status: Secondary | ICD-10-CM | POA: Diagnosis not present

## 2023-05-02 DIAGNOSIS — E1142 Type 2 diabetes mellitus with diabetic polyneuropathy: Secondary | ICD-10-CM | POA: Diagnosis not present

## 2023-05-02 DIAGNOSIS — B351 Tinea unguium: Secondary | ICD-10-CM | POA: Diagnosis not present

## 2023-05-02 DIAGNOSIS — L84 Corns and callosities: Secondary | ICD-10-CM | POA: Diagnosis not present

## 2023-05-13 ENCOUNTER — Encounter: Payer: Self-pay | Admitting: Internal Medicine

## 2023-05-16 ENCOUNTER — Ambulatory Visit (HOSPITAL_COMMUNITY)
Admission: RE | Admit: 2023-05-16 | Discharge: 2023-05-16 | Disposition: A | Discharge status: Home / Self Care | Payer: Medicare Other | Source: Ambulatory Visit | Attending: Nephrology | Admitting: Nephrology

## 2023-05-16 VITALS — BP 152/68 | HR 63 | Temp 98.0°F | Resp 18

## 2023-05-16 DIAGNOSIS — N185 Chronic kidney disease, stage 5: Secondary | ICD-10-CM | POA: Diagnosis present

## 2023-05-16 DIAGNOSIS — N183 Chronic kidney disease, stage 3 unspecified: Secondary | ICD-10-CM | POA: Insufficient documentation

## 2023-05-16 DIAGNOSIS — D631 Anemia in chronic kidney disease: Secondary | ICD-10-CM | POA: Diagnosis not present

## 2023-05-16 LAB — RENAL FUNCTION PANEL
Albumin: 4 g/dL (ref 3.5–5.0)
Anion gap: 13 (ref 5–15)
BUN: 107 mg/dL — ABNORMAL HIGH (ref 8–23)
CO2: 21 mmol/L — ABNORMAL LOW (ref 22–32)
Calcium: 8.7 mg/dL — ABNORMAL LOW (ref 8.9–10.3)
Chloride: 105 mmol/L (ref 98–111)
Creatinine, Ser: 4.53 mg/dL — ABNORMAL HIGH (ref 0.61–1.24)
GFR, Estimated: 13 mL/min — ABNORMAL LOW (ref >60)
Glucose, Bld: 99 mg/dL (ref 70–99)
Phosphorus: 6.2 mg/dL — ABNORMAL HIGH (ref 2.5–4.6)
Potassium: 4.2 mmol/L (ref 3.5–5.1)
Sodium: 139 mmol/L (ref 135–145)

## 2023-05-16 LAB — CBC WITH DIFFERENTIAL/PLATELET
Abs Immature Granulocytes: 0.08 10*3/uL — ABNORMAL HIGH (ref 0.00–0.07)
Basophils Absolute: 0.1 10*3/uL (ref 0.0–0.1)
Basophils Relative: 1 %
Eosinophils Absolute: 0.1 10*3/uL (ref 0.0–0.5)
Eosinophils Relative: 1 %
HCT: 36.5 % — ABNORMAL LOW (ref 39.0–52.0)
Hemoglobin: 11.5 g/dL — ABNORMAL LOW (ref 13.0–17.0)
Immature Granulocytes: 1 %
Lymphocytes Relative: 35 %
Lymphs Abs: 2.6 10*3/uL (ref 0.7–4.0)
MCH: 28.7 pg (ref 26.0–34.0)
MCHC: 31.5 g/dL (ref 30.0–36.0)
MCV: 91 fL (ref 80.0–100.0)
Monocytes Absolute: 0.7 10*3/uL (ref 0.1–1.0)
Monocytes Relative: 9 %
Neutro Abs: 3.9 10*3/uL (ref 1.7–7.7)
Neutrophils Relative %: 53 %
Platelets: 310 10*3/uL (ref 150–400)
RBC: 4.01 MIL/uL — ABNORMAL LOW (ref 4.22–5.81)
RDW: 14.1 % (ref 11.5–15.5)
WBC: 7.4 10*3/uL (ref 4.0–10.5)
nRBC: 0 % (ref 0.0–0.2)

## 2023-05-16 LAB — IRON AND TIBC
Iron: 69 ug/dL (ref 45–182)
Saturation Ratios: 23 % (ref 17.9–39.5)
TIBC: 300 ug/dL (ref 250–450)
UIBC: 231 ug/dL

## 2023-05-16 LAB — POCT HEMOGLOBIN-HEMACUE: Hemoglobin: 11.8 g/dL — ABNORMAL LOW (ref 13.0–17.0)

## 2023-05-16 LAB — FERRITIN: Ferritin: 115 ng/mL (ref 24–336)

## 2023-05-16 MED ORDER — EPOETIN ALFA 40000 UNIT/ML IJ SOLN
INTRAMUSCULAR | Status: AC
  Filled 2023-05-16: qty 1

## 2023-05-16 MED ORDER — EPOETIN ALFA 40000 UNIT/ML IJ SOLN
40000.0000 [IU] | INTRAMUSCULAR | Status: DC
  Administered 2023-05-16: 40000 [IU] via SUBCUTANEOUS

## 2023-05-17 ENCOUNTER — Other Ambulatory Visit: Payer: Self-pay

## 2023-05-17 ENCOUNTER — Emergency Department (HOSPITAL_COMMUNITY): Payer: Medicare Other

## 2023-05-17 ENCOUNTER — Encounter (HOSPITAL_COMMUNITY): Payer: Self-pay | Admitting: Emergency Medicine

## 2023-05-17 ENCOUNTER — Emergency Department (HOSPITAL_COMMUNITY)
Admission: EM | Admit: 2023-05-17 | Discharge: 2023-05-18 | Disposition: A | Discharge status: Home / Self Care | Payer: Medicare Other | Attending: Emergency Medicine | Admitting: Emergency Medicine

## 2023-05-17 DIAGNOSIS — I959 Hypotension, unspecified: Secondary | ICD-10-CM | POA: Diagnosis not present

## 2023-05-17 DIAGNOSIS — D649 Anemia, unspecified: Secondary | ICD-10-CM | POA: Diagnosis not present

## 2023-05-17 DIAGNOSIS — R0989 Other specified symptoms and signs involving the circulatory and respiratory systems: Secondary | ICD-10-CM | POA: Diagnosis not present

## 2023-05-17 DIAGNOSIS — N189 Chronic kidney disease, unspecified: Secondary | ICD-10-CM | POA: Insufficient documentation

## 2023-05-17 DIAGNOSIS — E039 Hypothyroidism, unspecified: Secondary | ICD-10-CM | POA: Diagnosis not present

## 2023-05-17 DIAGNOSIS — I130 Hypertensive heart and chronic kidney disease with heart failure and stage 1 through stage 4 chronic kidney disease, or unspecified chronic kidney disease: Secondary | ICD-10-CM | POA: Insufficient documentation

## 2023-05-17 DIAGNOSIS — I509 Heart failure, unspecified: Secondary | ICD-10-CM | POA: Insufficient documentation

## 2023-05-17 DIAGNOSIS — G20A1 Parkinson's disease without dyskinesia, without mention of fluctuations: Secondary | ICD-10-CM | POA: Diagnosis not present

## 2023-05-17 DIAGNOSIS — Z794 Long term (current) use of insulin: Secondary | ICD-10-CM | POA: Insufficient documentation

## 2023-05-17 DIAGNOSIS — I10 Essential (primary) hypertension: Secondary | ICD-10-CM | POA: Diagnosis not present

## 2023-05-17 DIAGNOSIS — R42 Dizziness and giddiness: Secondary | ICD-10-CM | POA: Diagnosis not present

## 2023-05-17 DIAGNOSIS — R531 Weakness: Secondary | ICD-10-CM | POA: Diagnosis not present

## 2023-05-17 DIAGNOSIS — E1122 Type 2 diabetes mellitus with diabetic chronic kidney disease: Secondary | ICD-10-CM | POA: Diagnosis not present

## 2023-05-17 DIAGNOSIS — R55 Syncope and collapse: Secondary | ICD-10-CM | POA: Insufficient documentation

## 2023-05-17 LAB — CBC WITH DIFFERENTIAL/PLATELET
Abs Immature Granulocytes: 0.17 10*3/uL — ABNORMAL HIGH (ref 0.00–0.07)
Basophils Absolute: 0 10*3/uL (ref 0.0–0.1)
Basophils Relative: 0 %
Eosinophils Absolute: 0 10*3/uL (ref 0.0–0.5)
Eosinophils Relative: 0 %
HCT: 34.3 % — ABNORMAL LOW (ref 39.0–52.0)
Hemoglobin: 10.5 g/dL — ABNORMAL LOW (ref 13.0–17.0)
Immature Granulocytes: 2 %
Lymphocytes Relative: 31 %
Lymphs Abs: 2.6 10*3/uL (ref 0.7–4.0)
MCH: 28.5 pg (ref 26.0–34.0)
MCHC: 30.6 g/dL (ref 30.0–36.0)
MCV: 93.2 fL (ref 80.0–100.0)
Monocytes Absolute: 0.9 10*3/uL (ref 0.1–1.0)
Monocytes Relative: 10 %
Neutro Abs: 4.7 10*3/uL (ref 1.7–7.7)
Neutrophils Relative %: 57 %
Platelets: 282 10*3/uL (ref 150–400)
RBC: 3.68 MIL/uL — ABNORMAL LOW (ref 4.22–5.81)
RDW: 14.4 % (ref 11.5–15.5)
WBC: 8.4 10*3/uL (ref 4.0–10.5)
nRBC: 0 % (ref 0.0–0.2)

## 2023-05-17 LAB — COMPREHENSIVE METABOLIC PANEL
ALT: 8 U/L (ref 0–44)
AST: 15 U/L (ref 15–41)
Albumin: 3.9 g/dL (ref 3.5–5.0)
Alkaline Phosphatase: 58 U/L (ref 38–126)
Anion gap: 17 — ABNORMAL HIGH (ref 5–15)
BUN: 118 mg/dL — ABNORMAL HIGH (ref 8–23)
CO2: 21 mmol/L — ABNORMAL LOW (ref 22–32)
Calcium: 8.7 mg/dL — ABNORMAL LOW (ref 8.9–10.3)
Chloride: 103 mmol/L (ref 98–111)
Creatinine, Ser: 4.69 mg/dL — ABNORMAL HIGH (ref 0.61–1.24)
GFR, Estimated: 13 mL/min — ABNORMAL LOW (ref >60)
Glucose, Bld: 47 mg/dL — ABNORMAL LOW (ref 70–99)
Potassium: 4.4 mmol/L (ref 3.5–5.1)
Sodium: 141 mmol/L (ref 135–145)
Total Bilirubin: 0.7 mg/dL (ref 0.0–1.2)
Total Protein: 6 g/dL — ABNORMAL LOW (ref 6.5–8.1)

## 2023-05-17 LAB — CBG MONITORING, ED
Glucose-Capillary: 74 mg/dL (ref 70–99)
Glucose-Capillary: 79 mg/dL (ref 70–99)

## 2023-05-17 LAB — TROPONIN I (HIGH SENSITIVITY): Troponin I (High Sensitivity): 12 ng/L (ref <18)

## 2023-05-17 NOTE — ED Provider Notes (Signed)
[place] EMERGENCY DEPARTMENT AT [facility] Provider Note   CSN: [id number] Arrival date & time: 05/17/23  2223     History {Add pertinent medical, surgical, social history, OB history to HPI:1} Chief Complaint  Patient presents with   Hypotension    Nathan Tyler is a 68 y.o. male.   Patient has hx of renal transplant currently on tacrolimus, mycophenolate for immunosuppression; hypothyroidism, elevated LFTs, CKD, hyperlipidemia, DMT2, CHF.  HPI     Home Medications Prior to Admission medications   Medication Sig Start Date End Date Taking? Authorizing Provider  amLODipine (NORVASC) 10 MG tablet Take 10 mg by mouth in the morning. 12/28/21   [provider]  atorvastatin (LIPITOR) 10 MG tablet Take 10 mg by mouth in the morning.    [provider]  carvedilol (COREG) 6.25 MG tablet Take 6.25 mg by mouth 2 (two) times daily with a meal.    [provider]  Deutetrabenazine (AUSTEDO) 6 MG TABS Take 6 mg by mouth 2 (two) times daily.    [provider]  furosemide (LASIX) 80 MG tablet Take 1 tablet (80 mg total) by mouth 2 (two) times daily. Patient taking differently: Take 40-80 mg by mouth See admin instructions. Take 80 mg in the morning and 40 mg at night 03/27/22 09/05/24  Kc, Dayna Barker, MD  GVOKE HYPOPEN 2-PACK 1 MG/0.2ML SOAJ Inject 1 Dose as directed as needed (SUGARS SEVERELY LOW LESS THAN 55 NOT RESPONDING TO SUGAR INTAKE). 04/27/22   [provider]  hydrALAZINE (APRESOLINE) 50 MG tablet Take 50 mg by mouth 3 (three) times daily. 02/06/22   [provider]  insulin aspart (NOVOLOG) 100 UNIT/ML injection Inject into the skin continuous. Uses in omnipod insulin pump    [provider]  Insulin Disposable Pump (OMNIPOD DASH PODS, GEN 4,) MISC  02/19/22   [provider]  levothyroxine (SYNTHROID) 125 MCG tablet Take 125 mcg by mouth daily before breakfast. 02/20/22   [provider]   mycophenolate (MYFORTIC) 180 MG EC tablet Take 1 tablet (180 mg total) by mouth 2 (two) times daily. 02/28/22   Marinda Elk, MD  predniSONE (DELTASONE) 5 MG tablet Take 1 tablet (5 mg total) by mouth daily with breakfast. restart 5mg  dose 3/10 after completing prednisone taper (2/24 - 3/9) 06/16/19   Tyrone Nine, MD  rifaximin (XIFAXAN) 550 MG TABS tablet Take 1 tablet (550 mg total) by mouth 3 (three) times daily. 03/12/23   Rourk, Gerrit Friends, MD  tacrolimus (PROGRAF) 0.5 MG capsule Take 1.5 mg by mouth 2 (two) times daily.     [provider]      Allergies    Patient has no known allergies.    Review of Systems   Review of Systems  Physical Exam Updated Tyler Signs BP (!) 155/70   Pulse 68   Temp 98.1 F (36.7 C) (Oral)   Resp 20   Ht 5\' 11"  (1.803 m)   Wt 95.3 kg   SpO2 98%   BMI 29.29 kg/m  Physical Exam  ED Results / Procedures / Treatments   Labs (all labs ordered are listed, but only abnormal results are displayed) Labs Reviewed  CBG MONITORING, ED    EKG None  Radiology No results found.  Procedures Procedures  {Document cardiac monitor, telemetry assessment procedure when appropriate:1}  Medications Ordered in ED Medications - No data to display  ED Course/ Medical Decision Making/ A&P   {   Click  here for ABCD2, HEART and other calculatorsREFRESH Note before signing :1}                              Medical Decision Making  ***  {Document critical care time when appropriate:1} {Document review of labs and clinical decision tools ie heart score, Chads2Vasc2 etc:1}  {Document your independent review of radiology images, and any outside records:1} {Document your discussion with family members, caretakers, and with consultants:1} {Document social determinants of health affecting pt's care:1} {Document your decision making why or why not admission, treatments were needed:1} Final Clinical Impression(s) / ED Diagnoses Final  diagnoses:  None    Rx / DC Orders ED Discharge Orders     None

## 2023-05-17 NOTE — ED Triage Notes (Signed)
Patient called EMS because he thought his BGL was low; his CGM was reading 43; EMS states their BGL stated 110; however he was orthostatic positive; BP dropped to 80SBP while standing.

## 2023-05-18 LAB — CBG MONITORING, ED
Glucose-Capillary: 82 mg/dL (ref 70–99)
Glucose-Capillary: 95 mg/dL (ref 70–99)

## 2023-05-18 NOTE — Discharge Instructions (Addendum)
Your workup in the ER was very reassuring. It is likely that your episode this evening of weakness and near syncope was likely related to incorrect dosage of your insulin causing drop in your blood sugar.  Please continue to discuss your insulin management with your nephrologist and your primary care doctor.  Follow-up your primary care doctor and return to the ER with any severe symptoms.

## 2023-05-18 NOTE — ED Notes (Signed)
Patient ambulated down the hallway and back without any difficulty.

## 2023-05-20 DIAGNOSIS — G2401 Drug induced subacute dyskinesia: Principal | ICD-10-CM

## 2023-05-20 DIAGNOSIS — G255 Other chorea: Principal | ICD-10-CM

## 2023-05-20 MED FILL — AUSTEDO 6 MG TABLET: ORAL | 30 days supply | Qty: 60 | Fill #6

## 2023-05-20 NOTE — Unmapped (Signed)
[facility] Refill Coordination Note    Specialty Medication(s) to be Shipped:   Neurology: Gary Rogers    Other medication(s) to be shipped: No additional medications requested for fill at this time     Gary Rogers, DOB: 12/24/1955  Phone: [phone] (home)       All above HIPAA information was verified with patient.     Was a Nurse, learning disability used for this call? No    Completed refill call assessment today to schedule patient's medication shipment from the [facility]  [phone]).  All relevant notes have been reviewed.     Specialty medication(s) and dose(s) confirmed: Regimen is correct and unchanged.   Changes to medications: [facility] reports no changes at this time.  Changes to insurance: No  New side effects reported not previously addressed with a pharmacist or physician: None reported  Questions for the pharmacist: No    Confirmed patient received a Conservation officer, historic buildings and a Surveyor, mining with first shipment. The patient will receive a drug information handout for each medication shipped and additional FDA Medication Guides as required.       DISEASE/MEDICATION-SPECIFIC INFORMATION        N/A    SPECIALTY MEDICATION ADHERENCE     Medication Adherence    Patient reported X missed doses in the last month: 1-2  Specialty Medication: AUSTEDO 6 mg Tab  Patient is on additional specialty medications: No              Were doses missed due to medication being on hold? No    Austedo 6 mg: 0 days of medicine on hand        REFERRAL TO PHARMACIST     Referral to the pharmacist: Not needed      [facility]     Shipping address confirmed in Epic.       Delivery Scheduled: Yes, Expected medication delivery date: 05/21/23.     Medication will be delivered via UPS to the prescription address in Epic WAM.    Gary Rogers   [facility]  Specialty Technician

## 2023-05-31 DIAGNOSIS — E039 Hypothyroidism, unspecified: Secondary | ICD-10-CM | POA: Diagnosis not present

## 2023-05-31 DIAGNOSIS — E1169 Type 2 diabetes mellitus with other specified complication: Secondary | ICD-10-CM | POA: Diagnosis not present

## 2023-05-31 DIAGNOSIS — E782 Mixed hyperlipidemia: Secondary | ICD-10-CM | POA: Diagnosis not present

## 2023-06-03 DIAGNOSIS — Z01818 Encounter for other preprocedural examination: Secondary | ICD-10-CM | POA: Diagnosis not present

## 2023-06-03 DIAGNOSIS — F09 Unspecified mental disorder due to known physiological condition: Secondary | ICD-10-CM | POA: Diagnosis not present

## 2023-06-03 DIAGNOSIS — F4324 Adjustment disorder with disturbance of conduct: Secondary | ICD-10-CM | POA: Diagnosis not present

## 2023-06-03 DIAGNOSIS — T8612 Kidney transplant failure: Secondary | ICD-10-CM | POA: Diagnosis not present

## 2023-06-03 DIAGNOSIS — Z940 Kidney transplant status: Secondary | ICD-10-CM | POA: Diagnosis not present

## 2023-06-03 DIAGNOSIS — Z7682 Awaiting organ transplant status: Secondary | ICD-10-CM | POA: Diagnosis not present

## 2023-06-04 DIAGNOSIS — Z940 Kidney transplant status: Secondary | ICD-10-CM | POA: Diagnosis not present

## 2023-06-04 DIAGNOSIS — I10 Essential (primary) hypertension: Secondary | ICD-10-CM | POA: Diagnosis not present

## 2023-06-04 DIAGNOSIS — E1169 Type 2 diabetes mellitus with other specified complication: Secondary | ICD-10-CM | POA: Diagnosis not present

## 2023-06-04 DIAGNOSIS — I5032 Chronic diastolic (congestive) heart failure: Secondary | ICD-10-CM | POA: Diagnosis not present

## 2023-06-04 DIAGNOSIS — J9601 Acute respiratory failure with hypoxia: Secondary | ICD-10-CM | POA: Diagnosis not present

## 2023-06-04 DIAGNOSIS — N185 Chronic kidney disease, stage 5: Secondary | ICD-10-CM | POA: Diagnosis not present

## 2023-06-04 DIAGNOSIS — I132 Hypertensive heart and chronic kidney disease with heart failure and with stage 5 chronic kidney disease, or end stage renal disease: Secondary | ICD-10-CM | POA: Diagnosis not present

## 2023-06-04 DIAGNOSIS — D509 Iron deficiency anemia, unspecified: Secondary | ICD-10-CM | POA: Diagnosis not present

## 2023-06-04 DIAGNOSIS — E039 Hypothyroidism, unspecified: Secondary | ICD-10-CM | POA: Diagnosis not present

## 2023-06-04 DIAGNOSIS — E782 Mixed hyperlipidemia: Secondary | ICD-10-CM | POA: Diagnosis not present

## 2023-06-04 DIAGNOSIS — S62301D Unspecified fracture of second metacarpal bone, left hand, subsequent encounter for fracture with routine healing: Secondary | ICD-10-CM | POA: Diagnosis not present

## 2023-06-04 DIAGNOSIS — G255 Other chorea: Secondary | ICD-10-CM | POA: Diagnosis not present

## 2023-06-12 DIAGNOSIS — G255 Other chorea: Principal | ICD-10-CM

## 2023-06-12 DIAGNOSIS — G2401 Drug induced subacute dyskinesia: Principal | ICD-10-CM

## 2023-06-12 MED ORDER — AUSTEDO 6 MG TABLET
ORAL_TABLET | Freq: Two times a day (BID) | ORAL | 6 refills | 30.00 days | Status: CP
Start: 2023-06-12 — End: ?
  Filled 2023-06-19: qty 60, 30d supply, fill #0

## 2023-06-12 NOTE — Unmapped (Signed)
 Refill request received from patient.      Medication Requested: Austedo 6 mg  Last Office Visit: 01/28/2023   Next Office Visit: 11/28/2023  Last Prescriber: Dr. Raenette Rover    Nurse refill requirements met? Yes  If not met, why:     Sent to: Pharmacy per protocol  If sent to provider, which provider?:

## 2023-06-13 ENCOUNTER — Encounter (HOSPITAL_COMMUNITY): Payer: Medicare Other

## 2023-06-14 ENCOUNTER — Ambulatory Visit (HOSPITAL_COMMUNITY)
Admission: RE | Admit: 2023-06-14 | Discharge: 2023-06-14 | Disposition: A | Discharge status: Home / Self Care | Payer: Medicare Other | Source: Ambulatory Visit | Attending: Nephrology | Admitting: Nephrology

## 2023-06-14 VITALS — BP 169/81 | HR 99 | Temp 97.9°F | Resp 18

## 2023-06-14 DIAGNOSIS — N183 Chronic kidney disease, stage 3 unspecified: Secondary | ICD-10-CM | POA: Diagnosis not present

## 2023-06-14 DIAGNOSIS — D631 Anemia in chronic kidney disease: Secondary | ICD-10-CM | POA: Insufficient documentation

## 2023-06-14 DIAGNOSIS — Z79890 Hormone replacement therapy (postmenopausal): Secondary | ICD-10-CM | POA: Insufficient documentation

## 2023-06-14 DIAGNOSIS — N185 Chronic kidney disease, stage 5: Secondary | ICD-10-CM | POA: Insufficient documentation

## 2023-06-14 LAB — CBC WITH DIFFERENTIAL/PLATELET
Abs Immature Granulocytes: 0.13 10*3/uL — ABNORMAL HIGH (ref 0.00–0.07)
Basophils Absolute: 0 10*3/uL (ref 0.0–0.1)
Basophils Relative: 1 %
Eosinophils Absolute: 0.1 10*3/uL (ref 0.0–0.5)
Eosinophils Relative: 1 %
HCT: 32.8 % — ABNORMAL LOW (ref 39.0–52.0)
Hemoglobin: 10 g/dL — ABNORMAL LOW (ref 13.0–17.0)
Immature Granulocytes: 2 %
Lymphocytes Relative: 30 %
Lymphs Abs: 2.1 10*3/uL (ref 0.7–4.0)
MCH: 28.3 pg (ref 26.0–34.0)
MCHC: 30.5 g/dL (ref 30.0–36.0)
MCV: 92.9 fL (ref 80.0–100.0)
Monocytes Absolute: 0.7 10*3/uL (ref 0.1–1.0)
Monocytes Relative: 10 %
Neutro Abs: 3.9 10*3/uL (ref 1.7–7.7)
Neutrophils Relative %: 56 %
Platelets: 275 10*3/uL (ref 150–400)
RBC: 3.53 MIL/uL — ABNORMAL LOW (ref 4.22–5.81)
RDW: 14.4 % (ref 11.5–15.5)
WBC: 7 10*3/uL (ref 4.0–10.5)
nRBC: 0 % (ref 0.0–0.2)

## 2023-06-14 LAB — RENAL FUNCTION PANEL
Albumin: 3.6 g/dL (ref 3.5–5.0)
Anion gap: 16 — ABNORMAL HIGH (ref 5–15)
BUN: 111 mg/dL — ABNORMAL HIGH (ref 8–23)
CO2: 19 mmol/L — ABNORMAL LOW (ref 22–32)
Calcium: 8.3 mg/dL — ABNORMAL LOW (ref 8.9–10.3)
Chloride: 108 mmol/L (ref 98–111)
Creatinine, Ser: 4.62 mg/dL — ABNORMAL HIGH (ref 0.61–1.24)
GFR, Estimated: 13 mL/min — ABNORMAL LOW (ref >60)
Glucose, Bld: 118 mg/dL — ABNORMAL HIGH (ref 70–99)
Phosphorus: 7.4 mg/dL — ABNORMAL HIGH (ref 2.5–4.6)
Potassium: 4.5 mmol/L (ref 3.5–5.1)
Sodium: 143 mmol/L (ref 135–145)

## 2023-06-14 LAB — POCT HEMOGLOBIN-HEMACUE: Hemoglobin: 11 g/dL — ABNORMAL LOW (ref 13.0–17.0)

## 2023-06-14 MED ORDER — EPOETIN ALFA 40000 UNIT/ML IJ SOLN
40000.0000 [IU] | INTRAMUSCULAR | Status: DC
  Administered 2023-06-14: 40000 [IU] via SUBCUTANEOUS

## 2023-06-14 MED ORDER — EPOETIN ALFA 40000 UNIT/ML IJ SOLN
INTRAMUSCULAR | Status: AC
  Filled 2023-06-14: qty 1

## 2023-06-18 NOTE — Unmapped (Signed)
 [facility] Refill Coordination Note    Specialty Medication(s) to be Shipped:   Neurology: [name]    Other medication(s) to be shipped: No additional medications requested for fill at this time     Gary Rogers, DOB: 24-Mar-1956  Phone: [phone] (home)       All above HIPAA information was verified with patient.     Was a Nurse, learning disability used for this call? No    Completed refill call assessment today to schedule patient's medication shipment from the [facility]  [phone]).  All relevant notes have been reviewed.     Specialty medication(s) and dose(s) confirmed: Regimen is correct and unchanged.   Changes to medications: [name] reports no changes at this time.  Changes to insurance: No  New side effects reported not previously addressed with a pharmacist or physician: None reported  Questions for the pharmacist: No    Confirmed patient received a Conservation officer, historic buildings and a Surveyor, mining with first shipment. The patient will receive a drug information handout for each medication shipped and additional FDA Medication Guides as required.       DISEASE/MEDICATION-SPECIFIC INFORMATION        N/A    SPECIALTY MEDICATION ADHERENCE     Medication Adherence    Patient reported X missed doses in the last month: 0  Specialty Medication: AUSTEDO 6 mg Tab (deutetrabenazine)  Patient is on additional specialty medications: No  Patient is on more than two specialty medications: No  Any gaps in refill history greater than 2 weeks in the last 3 months: no  Demonstrates understanding of importance of adherence: yes  Informant: patient  Reliability of informant: reliable  Provider-estimated medication adherence level: good  Patient is at risk for Non-Adherence: No  Reasons for non-adherence: no problems identified  Confirmed plan for next specialty medication refill: delivery by pharmacy  Refills needed for supportive medications: not needed          Refill Coordination    Has the Patients' Contact Information Changed: No  Is the Shipping Address Different: No         Were doses missed due to medication being on hold? No    deutetrabenazine: AUSTEDO 6 mg Tab  4:  days of medicine on hand       REFERRAL TO PHARMACIST     Referral to the pharmacist: Not needed      [facility]     Shipping address confirmed in Epic.       Delivery Scheduled: Yes, Expected medication delivery date: 02/27.     Medication will be delivered via UPS to the prescription address in Epic WAM.    [name]  Specialty Technician

## 2023-06-20 DIAGNOSIS — Z940 Kidney transplant status: Secondary | ICD-10-CM | POA: Diagnosis not present

## 2023-06-20 DIAGNOSIS — N185 Chronic kidney disease, stage 5: Secondary | ICD-10-CM | POA: Diagnosis not present

## 2023-06-20 DIAGNOSIS — N189 Chronic kidney disease, unspecified: Secondary | ICD-10-CM | POA: Diagnosis not present

## 2023-06-22 DIAGNOSIS — Z992 Dependence on renal dialysis: Secondary | ICD-10-CM | POA: Diagnosis not present

## 2023-06-22 DIAGNOSIS — N186 End stage renal disease: Secondary | ICD-10-CM | POA: Diagnosis not present

## 2023-06-22 DIAGNOSIS — N189 Chronic kidney disease, unspecified: Secondary | ICD-10-CM | POA: Diagnosis not present

## 2023-06-28 DIAGNOSIS — D631 Anemia in chronic kidney disease: Secondary | ICD-10-CM | POA: Diagnosis not present

## 2023-06-28 DIAGNOSIS — I770 Arteriovenous fistula, acquired: Secondary | ICD-10-CM | POA: Diagnosis not present

## 2023-06-28 DIAGNOSIS — N185 Chronic kidney disease, stage 5: Secondary | ICD-10-CM | POA: Diagnosis not present

## 2023-06-28 DIAGNOSIS — E872 Acidosis, unspecified: Secondary | ICD-10-CM | POA: Diagnosis not present

## 2023-06-28 DIAGNOSIS — Z79899 Other long term (current) drug therapy: Secondary | ICD-10-CM | POA: Diagnosis not present

## 2023-06-28 DIAGNOSIS — Z7682 Awaiting organ transplant status: Secondary | ICD-10-CM | POA: Diagnosis not present

## 2023-06-28 DIAGNOSIS — N2581 Secondary hyperparathyroidism of renal origin: Secondary | ICD-10-CM | POA: Diagnosis not present

## 2023-06-28 DIAGNOSIS — E875 Hyperkalemia: Secondary | ICD-10-CM | POA: Diagnosis not present

## 2023-06-28 DIAGNOSIS — E1122 Type 2 diabetes mellitus with diabetic chronic kidney disease: Secondary | ICD-10-CM | POA: Diagnosis not present

## 2023-06-28 DIAGNOSIS — N189 Chronic kidney disease, unspecified: Secondary | ICD-10-CM | POA: Diagnosis not present

## 2023-06-28 DIAGNOSIS — N179 Acute kidney failure, unspecified: Secondary | ICD-10-CM | POA: Diagnosis not present

## 2023-06-28 DIAGNOSIS — Z940 Kidney transplant status: Secondary | ICD-10-CM | POA: Diagnosis not present

## 2023-06-28 DIAGNOSIS — I120 Hypertensive chronic kidney disease with stage 5 chronic kidney disease or end stage renal disease: Secondary | ICD-10-CM | POA: Diagnosis not present

## 2023-07-04 DIAGNOSIS — N186 End stage renal disease: Secondary | ICD-10-CM | POA: Diagnosis not present

## 2023-07-04 DIAGNOSIS — N2581 Secondary hyperparathyroidism of renal origin: Secondary | ICD-10-CM | POA: Diagnosis not present

## 2023-07-04 DIAGNOSIS — D631 Anemia in chronic kidney disease: Secondary | ICD-10-CM | POA: Diagnosis not present

## 2023-07-04 DIAGNOSIS — Z992 Dependence on renal dialysis: Secondary | ICD-10-CM | POA: Diagnosis not present

## 2023-07-04 DIAGNOSIS — D509 Iron deficiency anemia, unspecified: Secondary | ICD-10-CM | POA: Diagnosis not present

## 2023-07-06 DIAGNOSIS — N2581 Secondary hyperparathyroidism of renal origin: Secondary | ICD-10-CM | POA: Diagnosis not present

## 2023-07-06 DIAGNOSIS — D631 Anemia in chronic kidney disease: Secondary | ICD-10-CM | POA: Diagnosis not present

## 2023-07-06 DIAGNOSIS — Z992 Dependence on renal dialysis: Secondary | ICD-10-CM | POA: Diagnosis not present

## 2023-07-06 DIAGNOSIS — D509 Iron deficiency anemia, unspecified: Secondary | ICD-10-CM | POA: Diagnosis not present

## 2023-07-06 DIAGNOSIS — N186 End stage renal disease: Secondary | ICD-10-CM | POA: Diagnosis not present

## 2023-07-09 DIAGNOSIS — Z992 Dependence on renal dialysis: Secondary | ICD-10-CM | POA: Diagnosis not present

## 2023-07-09 DIAGNOSIS — D631 Anemia in chronic kidney disease: Secondary | ICD-10-CM | POA: Diagnosis not present

## 2023-07-09 DIAGNOSIS — N186 End stage renal disease: Secondary | ICD-10-CM | POA: Diagnosis not present

## 2023-07-09 DIAGNOSIS — D509 Iron deficiency anemia, unspecified: Secondary | ICD-10-CM | POA: Diagnosis not present

## 2023-07-09 DIAGNOSIS — N2581 Secondary hyperparathyroidism of renal origin: Secondary | ICD-10-CM | POA: Diagnosis not present

## 2023-07-11 DIAGNOSIS — N2581 Secondary hyperparathyroidism of renal origin: Secondary | ICD-10-CM | POA: Diagnosis not present

## 2023-07-11 DIAGNOSIS — D631 Anemia in chronic kidney disease: Secondary | ICD-10-CM | POA: Diagnosis not present

## 2023-07-11 DIAGNOSIS — N186 End stage renal disease: Secondary | ICD-10-CM | POA: Diagnosis not present

## 2023-07-11 DIAGNOSIS — Z992 Dependence on renal dialysis: Secondary | ICD-10-CM | POA: Diagnosis not present

## 2023-07-11 DIAGNOSIS — D509 Iron deficiency anemia, unspecified: Secondary | ICD-10-CM | POA: Diagnosis not present

## 2023-07-11 NOTE — Unmapped (Signed)
 [facility] Clinical Assessment & Refill Coordination Note    Gary Rogers, [name]: November 06, 1955  Phone: [phone] (home)     All above HIPAA information was verified with patient.     Was a Nurse, learning disability used for this call? No    Specialty Medication(s):   Neurology: Gary Rogers     Current Outpatient Medications   Medication Sig Dispense Refill    amlodipine (NORVASC) 10 MG tablet Take 1 tablet (10 mg total) by mouth daily.      atorvastatin (LIPITOR) 10 MG tablet Take 1 tablet (10 mg total) by mouth in the morning.      CAPTOPRIL ORAL Take 6.25 mg by mouth two (2) times a day.      carvedilol (COREG) 6.25 MG tablet Take 1 tablet (6.25 mg total) by mouth two (2) times a day.      deutetrabenazine (AUSTEDO) 6 mg Tab Take 1 tablet (6 mg total) by mouth two (2) times a day. 60 tablet 6    DEXCOM G7 SENSOR Devi Use as directed.      furosemide (LASIX) 80 MG tablet 1 (ONE) TABLET IN THE MORNING, 1/2 TAB (40MG ) IN THE EVENING      glucagon (GVOKE HYPOPEN 1-PACK) 1 mg/0.2 mL AtIn Keep on hand      hydrALAZINE (APRESOLINE) 25 MG tablet       insulin degludec 200 unit/mL (3 mL) InPn Inject under the skin daily.       levothyroxine (SYNTHROID) 125 MCG tablet Take 1 tablet (125 mcg total) by mouth daily.      LOKELMA 10 gram PwPk packet 1 (ONE) PACKET BY MOUTH THREE TIMES A WEEK (Patient not taking: Reported on 01/28/2023)      mycophenolate (CELLCEPT) 500 mg tablet Take by mouth two (2) times a day.      mycophenolate (MYFORTIC) 180 MG EC tablet Take 1 tablet (180 mg total) by mouth two (2) times a day.      NOVOLOG U-100 INSULIN ASPART 100 unit/mL vial INJECT UP TO 200 UNITS INTO OMNIPOD INSULIN PUMP EVERY 3 DAYS.      OMNIPOD DASH PODS, GEN 4, Crtg CHANGE POD EVERY 72 HOURS      oxyCODONE (ROXICODONE) 5 MG immediate release tablet Take 1 tablet (5 mg total) by mouth every eight (8) hours as needed. (Patient not taking: Reported on 01/28/2023)      predniSONE (DELTASONE) 5 MG tablet Take 1 tablet (5 mg total) by mouth daily.      sodium bicarbonate 650 mg tablet Take 1 tablet (650 mg total) by mouth two (2) times a day.      tacrolimus (PROGRAF) 0.5 MG capsule Take 3 capsules (1.5 mg total) by mouth two (2) times a day. TAKE 4 CAPSULES BY MOUTH TWICE DAILY       No current facility-administered medications for this visit.        Changes to medications: [name] reports no changes at this time.    Medication list has been reviewed and updated in Epic: Yes    No Known Allergies    Changes to allergies: No    Allergies have been reviewed and updated in Epic: Yes    SPECIALTY MEDICATION ADHERENCE     Austedo 6 mg: ~11 days of medicine on hand       Medication Adherence    Patient reported X missed doses in the last month: 0  Specialty Medication: Austedo  Patient is on additional specialty medications:  No  Informant: patient          Specialty medication(s) dose(s) confirmed: Regimen is correct and unchanged.     Are there any concerns with adherence? No    Adherence counseling provided? Not needed    CLINICAL MANAGEMENT AND INTERVENTION      Clinical Benefit Assessment:    Do you feel the medicine is effective or helping your condition? Yes    Clinical Benefit counseling provided? Not needed    Adverse Effects Assessment:    Are you experiencing any side effects? No    Are you experiencing difficulty administering your medicine? No    Quality of Life Assessment:    Quality of Life    Rheumatology  Oncology  Dermatology  Cystic Fibrosis          How many days over the past month did your Tardive dyskinesia  keep you from your normal activities? For example, brushing your teeth or getting up in the morning. 0    Have you discussed this with your provider? Not needed    Acute Infection Status:    Acute infections noted within Epic:  No active infections    Patient reported infection: None    Therapy Appropriateness:    Is therapy appropriate based on current medication list, adverse reactions, adherence, clinical benefit and progress toward achieving therapeutic goals? Yes, therapy is appropriate and should be continued     Clinical Intervention:    Was an intervention completed as part of this clinical assessment? No    DISEASE/MEDICATION-SPECIFIC INFORMATION      N/A    Other Neurological Condtions: Not Applicable    PATIENT SPECIFIC NEEDS     Does the patient have any physical, cognitive, or cultural barriers? No    Is the patient high risk? No    Does the patient require physician intervention or other additional services (i.e., nutrition, smoking cessation, social work)? No    Does the patient have an additional or emergency contact listed in their chart? Yes    SOCIAL DETERMINANTS OF HEALTH     At the [facility], we have learned that life circumstances - like trouble affording food, housing, utilities, or transportation can affect the health of many of our patients.   That is why we wanted to ask: are you currently experiencing any life circumstances that are negatively impacting your health and/or quality of life? Patient declined to answer    Social Drivers of Health     Food Insecurity: No Food Insecurity (03/09/2022)    Received from [facility], [facility]    Hunger Vital Sign     Worried About Running Out of Food in the Last Year: Never true     Ran Out of Food in the Last Year: Never true   Tobacco Use: Low Risk  (01/28/2023)    Patient History     Smoking Tobacco Use: Never     Smokeless Tobacco Use: Never     Passive Exposure: Never   Transportation Needs: No Transportation Needs (03/09/2022)    Received from [facility], [facility]    PRAPARE - Transportation     Lack of Transportation (Medical): No     Lack of Transportation (Non-Medical): No   Alcohol Use: Not At Risk (06/01/2022)    Received from [facility], [facility]    AUDIT-C     Frequency of Alcohol Consumption: Never     Average Number of Drinks: Patient  does not drink     Frequency of Binge Drinking: Never Housing: Unknown (08/01/2020)    Housing     Within the past 12 months, have you ever stayed: outside, in a car, in a tent, in an overnight shelter, or temporarily in someone else's home (i.e. couch-surfing)?: No     Are you worried about losing your housing?: Not on file   Physical Activity: Not on file   Utilities: Not At Risk (03/09/2022)    Received from [facility], [facility]    [facility] Utilities     Threatened with loss of utilities: No   Stress: Not on file   Interpersonal Safety: Not on file   Substance Use: Not on file (02/26/2023)   Intimate Partner Violence: Not At Risk (03/09/2022)    Received from [facility], [facility]    Humiliation, Afraid, Rape, and Kick questionnaire     Fear of Current or Ex-Partner: No     Emotionally Abused: No     Physically Abused: No     Sexually Abused: No   Social Connections: Unknown (11/03/2021)    Received from [facility], [facility]    Social Network     Social Network: Not on file   Financial Resource Strain: Not on file   Depression: Not at risk (12/29/2022)    Received from [facility]    PHQ-2     Patient Health Questionnaire-2 Score: 0   Internet Connectivity: Not on file   Health Literacy: Not on file       Would you be willing to receive help with any of the needs that you have identified today? Not applicable       SHIPPING     Specialty Medication(s) to be Shipped:   Neurology: Austedo    Other medication(s) to be shipped: No additional medications requested for fill at this time     Changes to insurance: No    Cost and Payment: Patient has a $0 copay, payment information is not required.    Delivery Scheduled: Yes, Expected medication delivery date: 07/19/23.     Medication will be delivered via UPS to the confirmed prescription address in [facility].    The patient will receive a drug information handout for each medication shipped and additional FDA Medication Guides as required.  Verified that patient has previously received a Conservation officer, historic buildings and a Surveyor, mining.    The patient or caregiver noted above participated in the development of this care plan and knows that they can request review of or adjustments to the care plan at any time.      All of the patient's questions and concerns have been addressed.    [name], PharmD   [facility] Specialty Pharmacist

## 2023-07-12 ENCOUNTER — Encounter (HOSPITAL_COMMUNITY): Payer: Medicare Other

## 2023-07-13 DIAGNOSIS — D631 Anemia in chronic kidney disease: Secondary | ICD-10-CM | POA: Diagnosis not present

## 2023-07-13 DIAGNOSIS — Z992 Dependence on renal dialysis: Secondary | ICD-10-CM | POA: Diagnosis not present

## 2023-07-13 DIAGNOSIS — N186 End stage renal disease: Secondary | ICD-10-CM | POA: Diagnosis not present

## 2023-07-13 DIAGNOSIS — N2581 Secondary hyperparathyroidism of renal origin: Secondary | ICD-10-CM | POA: Diagnosis not present

## 2023-07-13 DIAGNOSIS — D509 Iron deficiency anemia, unspecified: Secondary | ICD-10-CM | POA: Diagnosis not present

## 2023-07-16 DIAGNOSIS — D509 Iron deficiency anemia, unspecified: Secondary | ICD-10-CM | POA: Diagnosis not present

## 2023-07-16 DIAGNOSIS — N186 End stage renal disease: Secondary | ICD-10-CM | POA: Diagnosis not present

## 2023-07-16 DIAGNOSIS — Z992 Dependence on renal dialysis: Secondary | ICD-10-CM | POA: Diagnosis not present

## 2023-07-16 DIAGNOSIS — D631 Anemia in chronic kidney disease: Secondary | ICD-10-CM | POA: Diagnosis not present

## 2023-07-16 DIAGNOSIS — N2581 Secondary hyperparathyroidism of renal origin: Secondary | ICD-10-CM | POA: Diagnosis not present

## 2023-07-18 DIAGNOSIS — Z992 Dependence on renal dialysis: Secondary | ICD-10-CM | POA: Diagnosis not present

## 2023-07-18 DIAGNOSIS — N2581 Secondary hyperparathyroidism of renal origin: Secondary | ICD-10-CM | POA: Diagnosis not present

## 2023-07-18 DIAGNOSIS — D509 Iron deficiency anemia, unspecified: Secondary | ICD-10-CM | POA: Diagnosis not present

## 2023-07-18 DIAGNOSIS — D631 Anemia in chronic kidney disease: Secondary | ICD-10-CM | POA: Diagnosis not present

## 2023-07-18 DIAGNOSIS — N186 End stage renal disease: Secondary | ICD-10-CM | POA: Diagnosis not present

## 2023-07-18 MED FILL — AUSTEDO 6 MG TABLET: ORAL | 30 days supply | Qty: 60 | Fill #1

## 2023-07-20 DIAGNOSIS — D509 Iron deficiency anemia, unspecified: Secondary | ICD-10-CM | POA: Diagnosis not present

## 2023-07-20 DIAGNOSIS — D631 Anemia in chronic kidney disease: Secondary | ICD-10-CM | POA: Diagnosis not present

## 2023-07-20 DIAGNOSIS — N2581 Secondary hyperparathyroidism of renal origin: Secondary | ICD-10-CM | POA: Diagnosis not present

## 2023-07-20 DIAGNOSIS — N186 End stage renal disease: Secondary | ICD-10-CM | POA: Diagnosis not present

## 2023-07-20 DIAGNOSIS — Z992 Dependence on renal dialysis: Secondary | ICD-10-CM | POA: Diagnosis not present

## 2023-07-22 DIAGNOSIS — E1142 Type 2 diabetes mellitus with diabetic polyneuropathy: Secondary | ICD-10-CM | POA: Diagnosis not present

## 2023-07-22 DIAGNOSIS — B351 Tinea unguium: Secondary | ICD-10-CM | POA: Diagnosis not present

## 2023-07-22 DIAGNOSIS — L84 Corns and callosities: Secondary | ICD-10-CM | POA: Diagnosis not present

## 2023-07-23 DIAGNOSIS — N186 End stage renal disease: Secondary | ICD-10-CM | POA: Diagnosis not present

## 2023-07-23 DIAGNOSIS — Z940 Kidney transplant status: Secondary | ICD-10-CM | POA: Diagnosis not present

## 2023-07-23 DIAGNOSIS — Z992 Dependence on renal dialysis: Secondary | ICD-10-CM | POA: Diagnosis not present

## 2023-07-23 DIAGNOSIS — N185 Chronic kidney disease, stage 5: Secondary | ICD-10-CM | POA: Diagnosis not present

## 2023-07-23 DIAGNOSIS — D631 Anemia in chronic kidney disease: Secondary | ICD-10-CM | POA: Diagnosis not present

## 2023-07-23 DIAGNOSIS — N2581 Secondary hyperparathyroidism of renal origin: Secondary | ICD-10-CM | POA: Diagnosis not present

## 2023-07-23 DIAGNOSIS — D509 Iron deficiency anemia, unspecified: Secondary | ICD-10-CM | POA: Diagnosis not present

## 2023-07-25 DIAGNOSIS — D509 Iron deficiency anemia, unspecified: Secondary | ICD-10-CM | POA: Diagnosis not present

## 2023-07-25 DIAGNOSIS — D631 Anemia in chronic kidney disease: Secondary | ICD-10-CM | POA: Diagnosis not present

## 2023-07-25 DIAGNOSIS — N2581 Secondary hyperparathyroidism of renal origin: Secondary | ICD-10-CM | POA: Diagnosis not present

## 2023-07-25 DIAGNOSIS — Z992 Dependence on renal dialysis: Secondary | ICD-10-CM | POA: Diagnosis not present

## 2023-07-25 DIAGNOSIS — N186 End stage renal disease: Secondary | ICD-10-CM | POA: Diagnosis not present

## 2023-07-26 ENCOUNTER — Encounter (INDEPENDENT_AMBULATORY_CARE_PROVIDER_SITE_OTHER): Payer: Self-pay | Admitting: Otolaryngology

## 2023-07-27 DIAGNOSIS — N186 End stage renal disease: Secondary | ICD-10-CM | POA: Diagnosis not present

## 2023-07-27 DIAGNOSIS — D631 Anemia in chronic kidney disease: Secondary | ICD-10-CM | POA: Diagnosis not present

## 2023-07-27 DIAGNOSIS — N2581 Secondary hyperparathyroidism of renal origin: Secondary | ICD-10-CM | POA: Diagnosis not present

## 2023-07-27 DIAGNOSIS — Z992 Dependence on renal dialysis: Secondary | ICD-10-CM | POA: Diagnosis not present

## 2023-07-27 DIAGNOSIS — D509 Iron deficiency anemia, unspecified: Secondary | ICD-10-CM | POA: Diagnosis not present

## 2023-07-30 DIAGNOSIS — Z992 Dependence on renal dialysis: Secondary | ICD-10-CM | POA: Diagnosis not present

## 2023-07-30 DIAGNOSIS — N186 End stage renal disease: Secondary | ICD-10-CM | POA: Diagnosis not present

## 2023-07-30 DIAGNOSIS — D631 Anemia in chronic kidney disease: Secondary | ICD-10-CM | POA: Diagnosis not present

## 2023-07-30 DIAGNOSIS — D509 Iron deficiency anemia, unspecified: Secondary | ICD-10-CM | POA: Diagnosis not present

## 2023-07-30 DIAGNOSIS — N2581 Secondary hyperparathyroidism of renal origin: Secondary | ICD-10-CM | POA: Diagnosis not present

## 2023-07-31 DIAGNOSIS — Z01818 Encounter for other preprocedural examination: Secondary | ICD-10-CM | POA: Diagnosis not present

## 2023-07-31 DIAGNOSIS — Z940 Kidney transplant status: Secondary | ICD-10-CM | POA: Diagnosis not present

## 2023-07-31 DIAGNOSIS — Z01810 Encounter for preprocedural cardiovascular examination: Secondary | ICD-10-CM | POA: Diagnosis not present

## 2023-07-31 DIAGNOSIS — N185 Chronic kidney disease, stage 5: Secondary | ICD-10-CM | POA: Diagnosis not present

## 2023-07-31 DIAGNOSIS — E039 Hypothyroidism, unspecified: Secondary | ICD-10-CM | POA: Diagnosis not present

## 2023-07-31 DIAGNOSIS — N186 End stage renal disease: Secondary | ICD-10-CM | POA: Diagnosis not present

## 2023-07-31 DIAGNOSIS — Z1159 Encounter for screening for other viral diseases: Secondary | ICD-10-CM | POA: Diagnosis not present

## 2023-07-31 DIAGNOSIS — Z114 Encounter for screening for human immunodeficiency virus [HIV]: Secondary | ICD-10-CM | POA: Diagnosis not present

## 2023-07-31 DIAGNOSIS — T8612 Kidney transplant failure: Secondary | ICD-10-CM | POA: Diagnosis not present

## 2023-07-31 DIAGNOSIS — Z79899 Other long term (current) drug therapy: Secondary | ICD-10-CM | POA: Diagnosis not present

## 2023-07-31 DIAGNOSIS — E1121 Type 2 diabetes mellitus with diabetic nephropathy: Secondary | ICD-10-CM | POA: Diagnosis not present

## 2023-07-31 DIAGNOSIS — E1122 Type 2 diabetes mellitus with diabetic chronic kidney disease: Secondary | ICD-10-CM | POA: Diagnosis not present

## 2023-07-31 DIAGNOSIS — R918 Other nonspecific abnormal finding of lung field: Secondary | ICD-10-CM | POA: Diagnosis not present

## 2023-07-31 DIAGNOSIS — Z125 Encounter for screening for malignant neoplasm of prostate: Secondary | ICD-10-CM | POA: Diagnosis not present

## 2023-07-31 DIAGNOSIS — Z7682 Awaiting organ transplant status: Secondary | ICD-10-CM | POA: Diagnosis not present

## 2023-07-31 DIAGNOSIS — I120 Hypertensive chronic kidney disease with stage 5 chronic kidney disease or end stage renal disease: Secondary | ICD-10-CM | POA: Diagnosis not present

## 2023-07-31 DIAGNOSIS — R935 Abnormal findings on diagnostic imaging of other abdominal regions, including retroperitoneum: Secondary | ICD-10-CM | POA: Diagnosis not present

## 2023-07-31 DIAGNOSIS — Z794 Long term (current) use of insulin: Secondary | ICD-10-CM | POA: Diagnosis not present

## 2023-08-01 DIAGNOSIS — D509 Iron deficiency anemia, unspecified: Secondary | ICD-10-CM | POA: Diagnosis not present

## 2023-08-01 DIAGNOSIS — N2581 Secondary hyperparathyroidism of renal origin: Secondary | ICD-10-CM | POA: Diagnosis not present

## 2023-08-01 DIAGNOSIS — Z7682 Awaiting organ transplant status: Secondary | ICD-10-CM | POA: Diagnosis not present

## 2023-08-01 DIAGNOSIS — N186 End stage renal disease: Secondary | ICD-10-CM | POA: Diagnosis not present

## 2023-08-01 DIAGNOSIS — D631 Anemia in chronic kidney disease: Secondary | ICD-10-CM | POA: Diagnosis not present

## 2023-08-01 DIAGNOSIS — Z992 Dependence on renal dialysis: Secondary | ICD-10-CM | POA: Diagnosis not present

## 2023-08-02 ENCOUNTER — Encounter (HOSPITAL_COMMUNITY): Admission: RE | Payer: Self-pay | Source: Home / Self Care

## 2023-08-02 ENCOUNTER — Encounter (INDEPENDENT_AMBULATORY_CARE_PROVIDER_SITE_OTHER): Payer: Self-pay | Admitting: Otolaryngology

## 2023-08-02 ENCOUNTER — Ambulatory Visit (HOSPITAL_COMMUNITY): Admission: RE | Admit: 2023-08-02 | Source: Home / Self Care | Admitting: Nephrology

## 2023-08-02 SURGERY — A/V SHUNT INTERVENTION
Anesthesia: LOCAL

## 2023-08-03 DIAGNOSIS — Z992 Dependence on renal dialysis: Secondary | ICD-10-CM | POA: Diagnosis not present

## 2023-08-03 DIAGNOSIS — D509 Iron deficiency anemia, unspecified: Secondary | ICD-10-CM | POA: Diagnosis not present

## 2023-08-03 DIAGNOSIS — N186 End stage renal disease: Secondary | ICD-10-CM | POA: Diagnosis not present

## 2023-08-03 DIAGNOSIS — N2581 Secondary hyperparathyroidism of renal origin: Secondary | ICD-10-CM | POA: Diagnosis not present

## 2023-08-03 DIAGNOSIS — D631 Anemia in chronic kidney disease: Secondary | ICD-10-CM | POA: Diagnosis not present

## 2023-08-05 DIAGNOSIS — Z89422 Acquired absence of other left toe(s): Secondary | ICD-10-CM | POA: Diagnosis not present

## 2023-08-05 DIAGNOSIS — R338 Other retention of urine: Secondary | ICD-10-CM | POA: Diagnosis not present

## 2023-08-05 DIAGNOSIS — G8918 Other acute postprocedural pain: Secondary | ICD-10-CM | POA: Diagnosis not present

## 2023-08-05 DIAGNOSIS — E872 Acidosis, unspecified: Secondary | ICD-10-CM | POA: Diagnosis present

## 2023-08-05 DIAGNOSIS — D649 Anemia, unspecified: Secondary | ICD-10-CM | POA: Diagnosis present

## 2023-08-05 DIAGNOSIS — T8612 Kidney transplant failure: Secondary | ICD-10-CM | POA: Diagnosis present

## 2023-08-05 DIAGNOSIS — I120 Hypertensive chronic kidney disease with stage 5 chronic kidney disease or end stage renal disease: Secondary | ICD-10-CM | POA: Diagnosis present

## 2023-08-05 DIAGNOSIS — D631 Anemia in chronic kidney disease: Secondary | ICD-10-CM | POA: Diagnosis not present

## 2023-08-05 DIAGNOSIS — E1165 Type 2 diabetes mellitus with hyperglycemia: Secondary | ICD-10-CM | POA: Diagnosis not present

## 2023-08-05 DIAGNOSIS — N2889 Other specified disorders of kidney and ureter: Secondary | ICD-10-CM | POA: Diagnosis not present

## 2023-08-05 DIAGNOSIS — Z794 Long term (current) use of insulin: Secondary | ICD-10-CM | POA: Diagnosis not present

## 2023-08-05 DIAGNOSIS — R0602 Shortness of breath: Secondary | ICD-10-CM | POA: Diagnosis not present

## 2023-08-05 DIAGNOSIS — E119 Type 2 diabetes mellitus without complications: Secondary | ICD-10-CM | POA: Diagnosis not present

## 2023-08-05 DIAGNOSIS — M898X9 Other specified disorders of bone, unspecified site: Secondary | ICD-10-CM | POA: Diagnosis present

## 2023-08-05 DIAGNOSIS — R339 Retention of urine, unspecified: Secondary | ICD-10-CM | POA: Diagnosis not present

## 2023-08-05 DIAGNOSIS — Z7682 Awaiting organ transplant status: Secondary | ICD-10-CM | POA: Diagnosis not present

## 2023-08-05 DIAGNOSIS — E1122 Type 2 diabetes mellitus with diabetic chronic kidney disease: Secondary | ICD-10-CM | POA: Diagnosis present

## 2023-08-05 DIAGNOSIS — H9193 Unspecified hearing loss, bilateral: Secondary | ICD-10-CM | POA: Diagnosis present

## 2023-08-05 DIAGNOSIS — I10 Essential (primary) hypertension: Secondary | ICD-10-CM | POA: Diagnosis not present

## 2023-08-05 DIAGNOSIS — I708 Atherosclerosis of other arteries: Secondary | ICD-10-CM | POA: Diagnosis present

## 2023-08-05 DIAGNOSIS — R739 Hyperglycemia, unspecified: Secondary | ICD-10-CM | POA: Diagnosis not present

## 2023-08-05 DIAGNOSIS — R1084 Generalized abdominal pain: Secondary | ICD-10-CM | POA: Diagnosis not present

## 2023-08-05 DIAGNOSIS — Z940 Kidney transplant status: Secondary | ICD-10-CM | POA: Diagnosis not present

## 2023-08-05 DIAGNOSIS — D849 Immunodeficiency, unspecified: Secondary | ICD-10-CM | POA: Diagnosis present

## 2023-08-05 DIAGNOSIS — E039 Hypothyroidism, unspecified: Secondary | ICD-10-CM | POA: Diagnosis present

## 2023-08-05 DIAGNOSIS — Z980 Intestinal bypass and anastomosis status: Secondary | ICD-10-CM | POA: Diagnosis not present

## 2023-08-05 DIAGNOSIS — Z4681 Encounter for fitting and adjustment of insulin pump: Secondary | ICD-10-CM | POA: Diagnosis not present

## 2023-08-05 DIAGNOSIS — T8611 Kidney transplant rejection: Secondary | ICD-10-CM | POA: Diagnosis not present

## 2023-08-05 DIAGNOSIS — Z0389 Encounter for observation for other suspected diseases and conditions ruled out: Secondary | ICD-10-CM | POA: Diagnosis not present

## 2023-08-05 DIAGNOSIS — Z8616 Personal history of COVID-19: Secondary | ICD-10-CM | POA: Diagnosis not present

## 2023-08-05 DIAGNOSIS — Z524 Kidney donor: Secondary | ICD-10-CM | POA: Diagnosis not present

## 2023-08-05 DIAGNOSIS — E139 Other specified diabetes mellitus without complications: Secondary | ICD-10-CM | POA: Diagnosis not present

## 2023-08-05 DIAGNOSIS — E1169 Type 2 diabetes mellitus with other specified complication: Secondary | ICD-10-CM | POA: Diagnosis not present

## 2023-08-05 DIAGNOSIS — E8779 Other fluid overload: Secondary | ICD-10-CM | POA: Diagnosis not present

## 2023-08-05 DIAGNOSIS — N186 End stage renal disease: Secondary | ICD-10-CM | POA: Diagnosis present

## 2023-08-05 DIAGNOSIS — E8770 Fluid overload, unspecified: Secondary | ICD-10-CM | POA: Diagnosis present

## 2023-08-05 DIAGNOSIS — R7989 Other specified abnormal findings of blood chemistry: Secondary | ICD-10-CM | POA: Diagnosis not present

## 2023-08-05 DIAGNOSIS — Z89421 Acquired absence of other right toe(s): Secondary | ICD-10-CM | POA: Diagnosis not present

## 2023-08-05 DIAGNOSIS — Z48298 Encounter for aftercare following other organ transplant: Secondary | ICD-10-CM | POA: Diagnosis not present

## 2023-08-05 DIAGNOSIS — T380X5A Adverse effect of glucocorticoids and synthetic analogues, initial encounter: Secondary | ICD-10-CM | POA: Diagnosis not present

## 2023-08-05 DIAGNOSIS — G2401 Drug induced subacute dyskinesia: Secondary | ICD-10-CM | POA: Diagnosis present

## 2023-08-05 DIAGNOSIS — N179 Acute kidney failure, unspecified: Secondary | ICD-10-CM | POA: Diagnosis present

## 2023-08-05 DIAGNOSIS — E875 Hyperkalemia: Secondary | ICD-10-CM | POA: Diagnosis present

## 2023-08-05 DIAGNOSIS — N2581 Secondary hyperparathyroidism of renal origin: Secondary | ICD-10-CM | POA: Diagnosis present

## 2023-08-05 DIAGNOSIS — R918 Other nonspecific abnormal finding of lung field: Secondary | ICD-10-CM | POA: Diagnosis not present

## 2023-08-05 DIAGNOSIS — I151 Hypertension secondary to other renal disorders: Secondary | ICD-10-CM | POA: Diagnosis not present

## 2023-08-05 DIAGNOSIS — N185 Chronic kidney disease, stage 5: Secondary | ICD-10-CM | POA: Diagnosis not present

## 2023-08-05 DIAGNOSIS — R5381 Other malaise: Secondary | ICD-10-CM | POA: Diagnosis not present

## 2023-08-05 DIAGNOSIS — Z992 Dependence on renal dialysis: Secondary | ICD-10-CM | POA: Diagnosis not present

## 2023-08-05 DIAGNOSIS — R931 Abnormal findings on diagnostic imaging of heart and coronary circulation: Secondary | ICD-10-CM | POA: Diagnosis not present

## 2023-08-05 DIAGNOSIS — E1140 Type 2 diabetes mellitus with diabetic neuropathy, unspecified: Secondary | ICD-10-CM | POA: Diagnosis present

## 2023-08-05 DIAGNOSIS — Z792 Long term (current) use of antibiotics: Secondary | ICD-10-CM | POA: Diagnosis not present

## 2023-08-05 DIAGNOSIS — N039 Chronic nephritic syndrome with unspecified morphologic changes: Secondary | ICD-10-CM | POA: Diagnosis not present

## 2023-08-06 DIAGNOSIS — N186 End stage renal disease: Secondary | ICD-10-CM | POA: Diagnosis not present

## 2023-08-06 DIAGNOSIS — E872 Acidosis, unspecified: Secondary | ICD-10-CM | POA: Diagnosis not present

## 2023-08-06 DIAGNOSIS — Z992 Dependence on renal dialysis: Secondary | ICD-10-CM | POA: Diagnosis not present

## 2023-08-06 DIAGNOSIS — D631 Anemia in chronic kidney disease: Secondary | ICD-10-CM | POA: Diagnosis not present

## 2023-08-06 DIAGNOSIS — Z7682 Awaiting organ transplant status: Secondary | ICD-10-CM | POA: Diagnosis not present

## 2023-08-06 DIAGNOSIS — D849 Immunodeficiency, unspecified: Secondary | ICD-10-CM | POA: Diagnosis not present

## 2023-08-06 DIAGNOSIS — N039 Chronic nephritic syndrome with unspecified morphologic changes: Secondary | ICD-10-CM | POA: Diagnosis not present

## 2023-08-06 DIAGNOSIS — N2581 Secondary hyperparathyroidism of renal origin: Secondary | ICD-10-CM | POA: Diagnosis not present

## 2023-08-06 DIAGNOSIS — E875 Hyperkalemia: Secondary | ICD-10-CM | POA: Diagnosis not present

## 2023-08-06 DIAGNOSIS — E8779 Other fluid overload: Secondary | ICD-10-CM | POA: Diagnosis not present

## 2023-08-06 DIAGNOSIS — Z940 Kidney transplant status: Secondary | ICD-10-CM | POA: Diagnosis not present

## 2023-08-07 DIAGNOSIS — Z992 Dependence on renal dialysis: Secondary | ICD-10-CM | POA: Diagnosis not present

## 2023-08-07 DIAGNOSIS — T8612 Kidney transplant failure: Secondary | ICD-10-CM | POA: Diagnosis not present

## 2023-08-07 DIAGNOSIS — N186 End stage renal disease: Secondary | ICD-10-CM | POA: Diagnosis not present

## 2023-08-07 DIAGNOSIS — E1122 Type 2 diabetes mellitus with diabetic chronic kidney disease: Secondary | ICD-10-CM | POA: Diagnosis not present

## 2023-08-08 DIAGNOSIS — Z992 Dependence on renal dialysis: Secondary | ICD-10-CM | POA: Diagnosis not present

## 2023-08-08 DIAGNOSIS — Z940 Kidney transplant status: Secondary | ICD-10-CM | POA: Diagnosis not present

## 2023-08-08 DIAGNOSIS — E139 Other specified diabetes mellitus without complications: Secondary | ICD-10-CM | POA: Diagnosis not present

## 2023-08-08 DIAGNOSIS — I151 Hypertension secondary to other renal disorders: Secondary | ICD-10-CM | POA: Diagnosis not present

## 2023-08-08 DIAGNOSIS — E1165 Type 2 diabetes mellitus with hyperglycemia: Secondary | ICD-10-CM | POA: Diagnosis not present

## 2023-08-08 DIAGNOSIS — R5381 Other malaise: Secondary | ICD-10-CM | POA: Diagnosis not present

## 2023-08-08 DIAGNOSIS — N186 End stage renal disease: Secondary | ICD-10-CM | POA: Diagnosis not present

## 2023-08-08 DIAGNOSIS — D631 Anemia in chronic kidney disease: Secondary | ICD-10-CM | POA: Diagnosis not present

## 2023-08-08 DIAGNOSIS — Z7682 Awaiting organ transplant status: Secondary | ICD-10-CM | POA: Diagnosis not present

## 2023-08-08 DIAGNOSIS — Z48298 Encounter for aftercare following other organ transplant: Secondary | ICD-10-CM | POA: Diagnosis not present

## 2023-08-08 DIAGNOSIS — N039 Chronic nephritic syndrome with unspecified morphologic changes: Secondary | ICD-10-CM | POA: Diagnosis not present

## 2023-08-08 DIAGNOSIS — D849 Immunodeficiency, unspecified: Secondary | ICD-10-CM | POA: Diagnosis not present

## 2023-08-08 DIAGNOSIS — Z794 Long term (current) use of insulin: Secondary | ICD-10-CM | POA: Diagnosis not present

## 2023-08-08 DIAGNOSIS — E875 Hyperkalemia: Secondary | ICD-10-CM | POA: Diagnosis not present

## 2023-08-09 ENCOUNTER — Encounter (HOSPITAL_COMMUNITY)

## 2023-08-09 DIAGNOSIS — Z48298 Encounter for aftercare following other organ transplant: Secondary | ICD-10-CM | POA: Diagnosis not present

## 2023-08-09 DIAGNOSIS — Z992 Dependence on renal dialysis: Secondary | ICD-10-CM | POA: Diagnosis not present

## 2023-08-09 DIAGNOSIS — E139 Other specified diabetes mellitus without complications: Secondary | ICD-10-CM | POA: Diagnosis not present

## 2023-08-09 DIAGNOSIS — E1165 Type 2 diabetes mellitus with hyperglycemia: Secondary | ICD-10-CM | POA: Diagnosis not present

## 2023-08-09 DIAGNOSIS — I151 Hypertension secondary to other renal disorders: Secondary | ICD-10-CM | POA: Diagnosis not present

## 2023-08-09 DIAGNOSIS — D631 Anemia in chronic kidney disease: Secondary | ICD-10-CM | POA: Diagnosis not present

## 2023-08-09 DIAGNOSIS — N039 Chronic nephritic syndrome with unspecified morphologic changes: Secondary | ICD-10-CM | POA: Diagnosis not present

## 2023-08-09 DIAGNOSIS — Z794 Long term (current) use of insulin: Secondary | ICD-10-CM | POA: Diagnosis not present

## 2023-08-09 DIAGNOSIS — N186 End stage renal disease: Secondary | ICD-10-CM | POA: Diagnosis not present

## 2023-08-09 DIAGNOSIS — E875 Hyperkalemia: Secondary | ICD-10-CM | POA: Diagnosis not present

## 2023-08-09 DIAGNOSIS — E1122 Type 2 diabetes mellitus with diabetic chronic kidney disease: Secondary | ICD-10-CM | POA: Diagnosis not present

## 2023-08-09 DIAGNOSIS — Z940 Kidney transplant status: Secondary | ICD-10-CM | POA: Diagnosis not present

## 2023-08-09 DIAGNOSIS — D849 Immunodeficiency, unspecified: Secondary | ICD-10-CM | POA: Diagnosis not present

## 2023-08-09 DIAGNOSIS — I10 Essential (primary) hypertension: Secondary | ICD-10-CM | POA: Diagnosis not present

## 2023-08-09 DIAGNOSIS — R5381 Other malaise: Secondary | ICD-10-CM | POA: Diagnosis not present

## 2023-08-09 DIAGNOSIS — Z792 Long term (current) use of antibiotics: Secondary | ICD-10-CM | POA: Diagnosis not present

## 2023-08-09 NOTE — Unmapped (Signed)
 [facility] Refill Coordination Note    Specialty Medication(s) to be Shipped:   Neurology: Austedo     Other medication(s) to be shipped: No additional medications requested for fill at this time     Gary Rogers, DOB: 11/02/1955  Phone: [phone] (home)       All above HIPAA information was verified with patient.     Was a Nurse, learning disability used for this call? No    Completed refill call assessment today to schedule patient's medication shipment from the [facility]  [phone]).  All relevant notes have been reviewed.     Specialty medication(s) and dose(s) confirmed: Regimen is correct and unchanged.   Changes to medications: Son reports no changes at this time.  Changes to insurance: No  New side effects reported not previously addressed with a pharmacist or physician: None reported  Questions for the pharmacist: No    Confirmed patient received a Conservation officer, historic buildings and a Surveyor, mining with first shipment. The patient will receive a drug information handout for each medication shipped and additional FDA Medication Guides as required.       DISEASE/MEDICATION-SPECIFIC INFORMATION        N/A    SPECIALTY MEDICATION ADHERENCE     Medication Adherence    Patient reported X missed doses in the last month: 0  Specialty Medication: deutetrabenazine : AUSTEDO  6 mg Tab  Patient is on additional specialty medications: No              Were doses missed due to medication being on hold? No    AUSTEDO  6 mg Tab (deutetrabenazine ): 14 days of medicine on hand         REFERRAL TO PHARMACIST     Referral to the pharmacist: Not needed      [facility]     Shipping address confirmed in Epic.     Cost and Payment: Patient has a $0 copay, payment information is not required.    Delivery Scheduled: Yes, Expected medication delivery date: 08/15/23.     Medication will be delivered via UPS to the prescription address in Epic W[facility].    Gary Rogers   [facility]  Specialty Technician

## 2023-08-10 DIAGNOSIS — E139 Other specified diabetes mellitus without complications: Secondary | ICD-10-CM | POA: Diagnosis not present

## 2023-08-10 DIAGNOSIS — D849 Immunodeficiency, unspecified: Secondary | ICD-10-CM | POA: Diagnosis not present

## 2023-08-10 DIAGNOSIS — Z7682 Awaiting organ transplant status: Secondary | ICD-10-CM | POA: Diagnosis not present

## 2023-08-10 DIAGNOSIS — N186 End stage renal disease: Secondary | ICD-10-CM | POA: Diagnosis not present

## 2023-08-10 DIAGNOSIS — Z940 Kidney transplant status: Secondary | ICD-10-CM | POA: Diagnosis not present

## 2023-08-10 DIAGNOSIS — E875 Hyperkalemia: Secondary | ICD-10-CM | POA: Diagnosis not present

## 2023-08-10 DIAGNOSIS — N039 Chronic nephritic syndrome with unspecified morphologic changes: Secondary | ICD-10-CM | POA: Diagnosis not present

## 2023-08-10 DIAGNOSIS — Z4681 Encounter for fitting and adjustment of insulin pump: Secondary | ICD-10-CM | POA: Diagnosis not present

## 2023-08-10 DIAGNOSIS — I151 Hypertension secondary to other renal disorders: Secondary | ICD-10-CM | POA: Diagnosis not present

## 2023-08-10 DIAGNOSIS — D631 Anemia in chronic kidney disease: Secondary | ICD-10-CM | POA: Diagnosis not present

## 2023-08-10 DIAGNOSIS — Z992 Dependence on renal dialysis: Secondary | ICD-10-CM | POA: Diagnosis not present

## 2023-08-10 DIAGNOSIS — R5381 Other malaise: Secondary | ICD-10-CM | POA: Diagnosis not present

## 2023-08-10 DIAGNOSIS — Z48298 Encounter for aftercare following other organ transplant: Secondary | ICD-10-CM | POA: Diagnosis not present

## 2023-08-11 DIAGNOSIS — D849 Immunodeficiency, unspecified: Secondary | ICD-10-CM | POA: Diagnosis not present

## 2023-08-11 DIAGNOSIS — Z48298 Encounter for aftercare following other organ transplant: Secondary | ICD-10-CM | POA: Diagnosis not present

## 2023-08-11 DIAGNOSIS — I151 Hypertension secondary to other renal disorders: Secondary | ICD-10-CM | POA: Diagnosis not present

## 2023-08-11 DIAGNOSIS — D631 Anemia in chronic kidney disease: Secondary | ICD-10-CM | POA: Diagnosis not present

## 2023-08-11 DIAGNOSIS — Z992 Dependence on renal dialysis: Secondary | ICD-10-CM | POA: Diagnosis not present

## 2023-08-11 DIAGNOSIS — R5381 Other malaise: Secondary | ICD-10-CM | POA: Diagnosis not present

## 2023-08-11 DIAGNOSIS — E139 Other specified diabetes mellitus without complications: Secondary | ICD-10-CM | POA: Diagnosis not present

## 2023-08-11 DIAGNOSIS — E875 Hyperkalemia: Secondary | ICD-10-CM | POA: Diagnosis not present

## 2023-08-11 DIAGNOSIS — N039 Chronic nephritic syndrome with unspecified morphologic changes: Secondary | ICD-10-CM | POA: Diagnosis not present

## 2023-08-11 DIAGNOSIS — Z940 Kidney transplant status: Secondary | ICD-10-CM | POA: Diagnosis not present

## 2023-08-11 DIAGNOSIS — Z4681 Encounter for fitting and adjustment of insulin pump: Secondary | ICD-10-CM | POA: Diagnosis not present

## 2023-08-11 DIAGNOSIS — N186 End stage renal disease: Secondary | ICD-10-CM | POA: Diagnosis not present

## 2023-08-14 MED FILL — AUSTEDO 6 MG TABLET: ORAL | 30 days supply | Qty: 60 | Fill #2

## 2023-08-23 DIAGNOSIS — Z5181 Encounter for therapeutic drug level monitoring: Secondary | ICD-10-CM | POA: Diagnosis not present

## 2023-08-23 DIAGNOSIS — D849 Immunodeficiency, unspecified: Secondary | ICD-10-CM | POA: Diagnosis not present

## 2023-08-23 DIAGNOSIS — B998 Other infectious disease: Secondary | ICD-10-CM | POA: Diagnosis not present

## 2023-08-23 DIAGNOSIS — Z940 Kidney transplant status: Secondary | ICD-10-CM | POA: Diagnosis not present

## 2023-08-26 DIAGNOSIS — Z125 Encounter for screening for malignant neoplasm of prostate: Secondary | ICD-10-CM | POA: Diagnosis not present

## 2023-08-26 DIAGNOSIS — R972 Elevated prostate specific antigen [PSA]: Secondary | ICD-10-CM | POA: Diagnosis not present

## 2023-08-26 DIAGNOSIS — R339 Retention of urine, unspecified: Secondary | ICD-10-CM | POA: Diagnosis not present

## 2023-08-28 DIAGNOSIS — B998 Other infectious disease: Secondary | ICD-10-CM | POA: Diagnosis not present

## 2023-08-28 DIAGNOSIS — R339 Retention of urine, unspecified: Secondary | ICD-10-CM | POA: Diagnosis not present

## 2023-08-28 DIAGNOSIS — Z5181 Encounter for therapeutic drug level monitoring: Secondary | ICD-10-CM | POA: Diagnosis not present

## 2023-08-28 DIAGNOSIS — D849 Immunodeficiency, unspecified: Secondary | ICD-10-CM | POA: Diagnosis not present

## 2023-08-28 DIAGNOSIS — Z940 Kidney transplant status: Secondary | ICD-10-CM | POA: Diagnosis not present

## 2023-08-28 DIAGNOSIS — I151 Hypertension secondary to other renal disorders: Secondary | ICD-10-CM | POA: Diagnosis not present

## 2023-08-28 DIAGNOSIS — Z48298 Encounter for aftercare following other organ transplant: Secondary | ICD-10-CM | POA: Diagnosis not present

## 2023-08-28 DIAGNOSIS — L989 Disorder of the skin and subcutaneous tissue, unspecified: Secondary | ICD-10-CM | POA: Diagnosis not present

## 2023-08-28 DIAGNOSIS — T8611 Kidney transplant rejection: Secondary | ICD-10-CM | POA: Diagnosis not present

## 2023-08-28 DIAGNOSIS — E139 Other specified diabetes mellitus without complications: Secondary | ICD-10-CM | POA: Diagnosis not present

## 2023-08-30 DIAGNOSIS — D849 Immunodeficiency, unspecified: Secondary | ICD-10-CM | POA: Diagnosis not present

## 2023-08-30 DIAGNOSIS — Z5181 Encounter for therapeutic drug level monitoring: Secondary | ICD-10-CM | POA: Diagnosis not present

## 2023-08-30 DIAGNOSIS — B998 Other infectious disease: Secondary | ICD-10-CM | POA: Diagnosis not present

## 2023-08-30 DIAGNOSIS — Z940 Kidney transplant status: Secondary | ICD-10-CM | POA: Diagnosis not present

## 2023-09-04 DIAGNOSIS — G2401 Drug induced subacute dyskinesia: Secondary | ICD-10-CM | POA: Diagnosis not present

## 2023-09-04 DIAGNOSIS — D84821 Immunodeficiency due to drugs: Secondary | ICD-10-CM | POA: Diagnosis not present

## 2023-09-04 DIAGNOSIS — E878 Other disorders of electrolyte and fluid balance, not elsewhere classified: Secondary | ICD-10-CM | POA: Diagnosis not present

## 2023-09-04 DIAGNOSIS — T8611 Kidney transplant rejection: Secondary | ICD-10-CM | POA: Diagnosis not present

## 2023-09-04 DIAGNOSIS — E139 Other specified diabetes mellitus without complications: Secondary | ICD-10-CM | POA: Diagnosis not present

## 2023-09-04 DIAGNOSIS — G8918 Other acute postprocedural pain: Secondary | ICD-10-CM | POA: Diagnosis not present

## 2023-09-04 DIAGNOSIS — Z940 Kidney transplant status: Secondary | ICD-10-CM | POA: Diagnosis not present

## 2023-09-04 DIAGNOSIS — Z1159 Encounter for screening for other viral diseases: Secondary | ICD-10-CM | POA: Diagnosis not present

## 2023-09-04 DIAGNOSIS — Z114 Encounter for screening for human immunodeficiency virus [HIV]: Secondary | ICD-10-CM | POA: Diagnosis not present

## 2023-09-04 DIAGNOSIS — E0965 Drug or chemical induced diabetes mellitus with hyperglycemia: Secondary | ICD-10-CM | POA: Diagnosis not present

## 2023-09-04 DIAGNOSIS — Z794 Long term (current) use of insulin: Secondary | ICD-10-CM | POA: Diagnosis not present

## 2023-09-04 DIAGNOSIS — D649 Anemia, unspecified: Secondary | ICD-10-CM | POA: Diagnosis not present

## 2023-09-04 DIAGNOSIS — E875 Hyperkalemia: Secondary | ICD-10-CM | POA: Diagnosis not present

## 2023-09-04 DIAGNOSIS — D638 Anemia in other chronic diseases classified elsewhere: Secondary | ICD-10-CM | POA: Diagnosis not present

## 2023-09-04 DIAGNOSIS — Z7982 Long term (current) use of aspirin: Secondary | ICD-10-CM | POA: Diagnosis not present

## 2023-09-04 DIAGNOSIS — I10 Essential (primary) hypertension: Secondary | ICD-10-CM | POA: Diagnosis not present

## 2023-09-04 DIAGNOSIS — Z48298 Encounter for aftercare following other organ transplant: Secondary | ICD-10-CM | POA: Diagnosis not present

## 2023-09-04 DIAGNOSIS — G4700 Insomnia, unspecified: Secondary | ICD-10-CM | POA: Diagnosis not present

## 2023-09-04 DIAGNOSIS — D849 Immunodeficiency, unspecified: Secondary | ICD-10-CM | POA: Diagnosis not present

## 2023-09-04 DIAGNOSIS — R339 Retention of urine, unspecified: Secondary | ICD-10-CM | POA: Diagnosis not present

## 2023-09-04 DIAGNOSIS — N2589 Other disorders resulting from impaired renal tubular function: Secondary | ICD-10-CM | POA: Diagnosis not present

## 2023-09-04 DIAGNOSIS — Z79899 Other long term (current) drug therapy: Secondary | ICD-10-CM | POA: Diagnosis not present

## 2023-09-04 DIAGNOSIS — I151 Hypertension secondary to other renal disorders: Secondary | ICD-10-CM | POA: Diagnosis not present

## 2023-09-04 DIAGNOSIS — Z4822 Encounter for aftercare following kidney transplant: Secondary | ICD-10-CM | POA: Diagnosis not present

## 2023-09-04 NOTE — Unmapped (Signed)
 [facility] Refill Coordination Note    Specialty Medication(s) to be Shipped:   Neurology: Austedo    Other medication(s) to be shipped: No additional medications requested for fill at this time     Gary Rogers, DOB: 10-21-1955  Phone: [phone] (home)       All above HIPAA information was verified with patient.     Was a Nurse, learning disability used for this call? No    Completed refill call assessment today to schedule patient's medication shipment from the [facility]  [phone]).  All relevant notes have been reviewed.     Specialty medication(s) and dose(s) confirmed: Regimen is correct and unchanged.   Changes to medications: Gary Rogers reports no changes at this time.  Changes to insurance: No  New side effects reported not previously addressed with a pharmacist or physician: None reported  Questions for the pharmacist: No    Confirmed patient received a Conservation officer, historic buildings and a Surveyor, mining with first shipment. The patient will receive a drug information handout for each medication shipped and additional FDA Medication Guides as required.       DISEASE/MEDICATION-SPECIFIC INFORMATION        N/A    SPECIALTY MEDICATION ADHERENCE     Medication Adherence    Patient reported X missed doses in the last month: 0  Specialty Medication: deutetrabenazine: AUSTEDO 6 mg Tab  Patient is on additional specialty medications: No              Were doses missed due to medication being on hold? No    Austedo 6 mg: 14 days of medicine on hand        REFERRAL TO PHARMACIST     Referral to the pharmacist: Not needed      [facility]     Shipping address confirmed in Epic.     Cost and Payment: Patient has a $0 copay, payment information is not required.    Delivery Scheduled: Yes, Expected medication delivery date: 09/18/23.     Medication will be delivered via UPS to the prescription address in Epic WAM.    Canary Ceo   [facility] Specialty Technician

## 2023-09-06 DIAGNOSIS — N185 Chronic kidney disease, stage 5: Secondary | ICD-10-CM | POA: Diagnosis not present

## 2023-09-06 DIAGNOSIS — E782 Mixed hyperlipidemia: Secondary | ICD-10-CM | POA: Diagnosis not present

## 2023-09-06 DIAGNOSIS — E039 Hypothyroidism, unspecified: Secondary | ICD-10-CM | POA: Diagnosis not present

## 2023-09-06 DIAGNOSIS — E1169 Type 2 diabetes mellitus with other specified complication: Secondary | ICD-10-CM | POA: Diagnosis not present

## 2023-09-06 DIAGNOSIS — Z940 Kidney transplant status: Secondary | ICD-10-CM | POA: Diagnosis not present

## 2023-09-06 DIAGNOSIS — E118 Type 2 diabetes mellitus with unspecified complications: Secondary | ICD-10-CM | POA: Diagnosis not present

## 2023-09-06 DIAGNOSIS — E1122 Type 2 diabetes mellitus with diabetic chronic kidney disease: Secondary | ICD-10-CM | POA: Diagnosis not present

## 2023-09-11 DIAGNOSIS — Z940 Kidney transplant status: Secondary | ICD-10-CM | POA: Diagnosis not present

## 2023-09-11 DIAGNOSIS — D849 Immunodeficiency, unspecified: Secondary | ICD-10-CM | POA: Diagnosis not present

## 2023-09-11 DIAGNOSIS — B998 Other infectious disease: Secondary | ICD-10-CM | POA: Diagnosis not present

## 2023-09-11 DIAGNOSIS — Z5181 Encounter for therapeutic drug level monitoring: Secondary | ICD-10-CM | POA: Diagnosis not present

## 2023-09-17 MED FILL — AUSTEDO 6 MG TABLET: ORAL | 30 days supply | Qty: 60 | Fill #3

## 2023-09-18 ENCOUNTER — Other Ambulatory Visit: Payer: Self-pay

## 2023-09-18 ENCOUNTER — Emergency Department (HOSPITAL_COMMUNITY)

## 2023-09-18 ENCOUNTER — Emergency Department (HOSPITAL_COMMUNITY)
Admission: EM | Admit: 2023-09-18 | Discharge: 2023-09-18 | Disposition: A | Discharge status: Home / Self Care | Attending: Emergency Medicine | Admitting: Emergency Medicine

## 2023-09-18 ENCOUNTER — Encounter (HOSPITAL_COMMUNITY): Payer: Self-pay | Admitting: Emergency Medicine

## 2023-09-18 DIAGNOSIS — R339 Retention of urine, unspecified: Secondary | ICD-10-CM | POA: Diagnosis not present

## 2023-09-18 DIAGNOSIS — Z9641 Presence of insulin pump (external) (internal): Secondary | ICD-10-CM | POA: Diagnosis not present

## 2023-09-18 DIAGNOSIS — E7800 Pure hypercholesterolemia, unspecified: Secondary | ICD-10-CM | POA: Diagnosis not present

## 2023-09-18 DIAGNOSIS — Z794 Long term (current) use of insulin: Secondary | ICD-10-CM | POA: Diagnosis not present

## 2023-09-18 DIAGNOSIS — K529 Noninfective gastroenteritis and colitis, unspecified: Secondary | ICD-10-CM | POA: Diagnosis not present

## 2023-09-18 DIAGNOSIS — R197 Diarrhea, unspecified: Secondary | ICD-10-CM | POA: Diagnosis not present

## 2023-09-18 DIAGNOSIS — R918 Other nonspecific abnormal finding of lung field: Secondary | ICD-10-CM | POA: Diagnosis not present

## 2023-09-18 DIAGNOSIS — T8619 Other complication of kidney transplant: Secondary | ICD-10-CM | POA: Diagnosis not present

## 2023-09-18 DIAGNOSIS — W1839XA Other fall on same level, initial encounter: Secondary | ICD-10-CM | POA: Diagnosis not present

## 2023-09-18 DIAGNOSIS — R338 Other retention of urine: Secondary | ICD-10-CM | POA: Diagnosis not present

## 2023-09-18 DIAGNOSIS — E875 Hyperkalemia: Secondary | ICD-10-CM | POA: Insufficient documentation

## 2023-09-18 DIAGNOSIS — R42 Dizziness and giddiness: Secondary | ICD-10-CM | POA: Diagnosis not present

## 2023-09-18 DIAGNOSIS — Z7982 Long term (current) use of aspirin: Secondary | ICD-10-CM | POA: Diagnosis not present

## 2023-09-18 DIAGNOSIS — N179 Acute kidney failure, unspecified: Secondary | ICD-10-CM | POA: Diagnosis not present

## 2023-09-18 DIAGNOSIS — D849 Immunodeficiency, unspecified: Secondary | ICD-10-CM | POA: Diagnosis not present

## 2023-09-18 DIAGNOSIS — R0609 Other forms of dyspnea: Secondary | ICD-10-CM | POA: Diagnosis not present

## 2023-09-18 DIAGNOSIS — I700 Atherosclerosis of aorta: Secondary | ICD-10-CM | POA: Diagnosis not present

## 2023-09-18 DIAGNOSIS — R0602 Shortness of breath: Secondary | ICD-10-CM | POA: Diagnosis not present

## 2023-09-18 DIAGNOSIS — Z792 Long term (current) use of antibiotics: Secondary | ICD-10-CM | POA: Diagnosis not present

## 2023-09-18 DIAGNOSIS — G2401 Drug induced subacute dyskinesia: Secondary | ICD-10-CM | POA: Diagnosis not present

## 2023-09-18 DIAGNOSIS — I951 Orthostatic hypotension: Secondary | ICD-10-CM | POA: Diagnosis not present

## 2023-09-18 DIAGNOSIS — E872 Acidosis, unspecified: Secondary | ICD-10-CM | POA: Diagnosis not present

## 2023-09-18 DIAGNOSIS — B998 Other infectious disease: Secondary | ICD-10-CM | POA: Diagnosis not present

## 2023-09-18 DIAGNOSIS — E139 Other specified diabetes mellitus without complications: Secondary | ICD-10-CM | POA: Diagnosis not present

## 2023-09-18 DIAGNOSIS — R799 Abnormal finding of blood chemistry, unspecified: Secondary | ICD-10-CM | POA: Diagnosis present

## 2023-09-18 DIAGNOSIS — E1122 Type 2 diabetes mellitus with diabetic chronic kidney disease: Secondary | ICD-10-CM | POA: Diagnosis not present

## 2023-09-18 DIAGNOSIS — E869 Volume depletion, unspecified: Secondary | ICD-10-CM | POA: Diagnosis not present

## 2023-09-18 DIAGNOSIS — E1165 Type 2 diabetes mellitus with hyperglycemia: Secondary | ICD-10-CM | POA: Diagnosis not present

## 2023-09-18 DIAGNOSIS — Z940 Kidney transplant status: Secondary | ICD-10-CM | POA: Diagnosis not present

## 2023-09-18 DIAGNOSIS — R739 Hyperglycemia, unspecified: Secondary | ICD-10-CM | POA: Diagnosis not present

## 2023-09-18 DIAGNOSIS — Z8616 Personal history of COVID-19: Secondary | ICD-10-CM | POA: Diagnosis not present

## 2023-09-18 DIAGNOSIS — Y92002 Bathroom of unspecified non-institutional (private) residence single-family (private) house as the place of occurrence of the external cause: Secondary | ICD-10-CM | POA: Diagnosis not present

## 2023-09-18 DIAGNOSIS — E039 Hypothyroidism, unspecified: Secondary | ICD-10-CM | POA: Diagnosis not present

## 2023-09-18 DIAGNOSIS — T380X5A Adverse effect of glucocorticoids and synthetic analogues, initial encounter: Secondary | ICD-10-CM | POA: Diagnosis not present

## 2023-09-18 DIAGNOSIS — Z79899 Other long term (current) drug therapy: Secondary | ICD-10-CM | POA: Diagnosis not present

## 2023-09-18 DIAGNOSIS — G255 Other chorea: Secondary | ICD-10-CM | POA: Diagnosis not present

## 2023-09-18 LAB — CBC WITH DIFFERENTIAL/PLATELET
Abs Immature Granulocytes: 0.31 10*3/uL — ABNORMAL HIGH (ref 0.00–0.07)
Basophils Absolute: 0 10*3/uL (ref 0.0–0.1)
Basophils Relative: 0 %
Eosinophils Absolute: 0 10*3/uL (ref 0.0–0.5)
Eosinophils Relative: 0 %
HCT: 33.1 % — ABNORMAL LOW (ref 39.0–52.0)
Hemoglobin: 10.2 g/dL — ABNORMAL LOW (ref 13.0–17.0)
Immature Granulocytes: 4 %
Lymphocytes Relative: 8 %
Lymphs Abs: 0.6 10*3/uL — ABNORMAL LOW (ref 0.7–4.0)
MCH: 30 pg (ref 26.0–34.0)
MCHC: 30.8 g/dL (ref 30.0–36.0)
MCV: 97.4 fL (ref 80.0–100.0)
Monocytes Absolute: 0.5 10*3/uL (ref 0.1–1.0)
Monocytes Relative: 6 %
Neutro Abs: 6.5 10*3/uL (ref 1.7–7.7)
Neutrophils Relative %: 82 %
Platelets: 237 10*3/uL (ref 150–400)
RBC: 3.4 MIL/uL — ABNORMAL LOW (ref 4.22–5.81)
RDW: 18.5 % — ABNORMAL HIGH (ref 11.5–15.5)
WBC: 8 10*3/uL (ref 4.0–10.5)
nRBC: 0 % (ref 0.0–0.2)

## 2023-09-18 LAB — COMPREHENSIVE METABOLIC PANEL WITH GFR
ALT: 14 U/L (ref 0–44)
AST: 17 U/L (ref 15–41)
Albumin: 3.4 g/dL — ABNORMAL LOW (ref 3.5–5.0)
Alkaline Phosphatase: 66 U/L (ref 38–126)
Anion gap: 5 (ref 5–15)
BUN: 33 mg/dL — ABNORMAL HIGH (ref 8–23)
CO2: 22 mmol/L (ref 22–32)
Calcium: 8.7 mg/dL — ABNORMAL LOW (ref 8.9–10.3)
Chloride: 112 mmol/L — ABNORMAL HIGH (ref 98–111)
Creatinine, Ser: 1.67 mg/dL — ABNORMAL HIGH (ref 0.61–1.24)
GFR, Estimated: 44 mL/min — ABNORMAL LOW (ref >60)
Glucose, Bld: 155 mg/dL — ABNORMAL HIGH (ref 70–99)
Potassium: 6.2 mmol/L — ABNORMAL HIGH (ref 3.5–5.1)
Sodium: 139 mmol/L (ref 135–145)
Total Bilirubin: 0.5 mg/dL (ref 0.0–1.2)
Total Protein: 5.8 g/dL — ABNORMAL LOW (ref 6.5–8.1)

## 2023-09-18 MED ORDER — SODIUM ZIRCONIUM CYCLOSILICATE 10 G PO PACK
10.0000 g | PACK | Freq: Once | ORAL | Status: AC
  Administered 2023-09-18: 10 g via ORAL

## 2023-09-18 NOTE — Discharge Instructions (Addendum)
 Please call [facility] today in order to obtain further indications on your care.  If experience any worsening symptoms return to the emergency department.

## 2023-09-18 NOTE — ED Provider Notes (Signed)
 [place] EMERGENCY DEPARTMENT AT [facility] Provider Note   CSN: [id number] Arrival date & time: 09/18/23  1150     History Kidney transplant Chief Complaint  Patient presents with   Abnormal Labs    Nathan Tyler is a 68 y.o. male.  68 year old male with a past medical history of recent kidney transplant on August 07, 2023 presents to the ED with a chief complaint of abnormal labs.  Reports he went over to [facility] and her to have his blood checked, reports that he was call with an alarming critical level of potassium of 6.2.  He reports he has had no complaints recently.  He has been more dizzy than usual.  He sounds somewhat gasping for air whenever he is telling me his story, but significant other at the bedside states that this is his baseline.  His primary care is obtained at [facility] after his kidney transplant he has been feeling overall pretty well.  No fevers, no abdominal pain, no other complaints reported.  Last oral intake was prior to arrival.  The history is provided by the patient.       Home Medications Prior to Admission medications   Medication Sig Start Date End Date Taking? Authorizing Provider  amLODipine  (NORVASC ) 10 MG tablet Take 10 mg by mouth in the morning. 12/28/21   [provider]  atorvastatin  (LIPITOR) 10 MG tablet Take 10 mg by mouth in the morning.    [provider]  carvedilol  (COREG ) 6.25 MG tablet Take 6.25 mg by mouth 2 (two) times daily with a meal.    [provider]  Deutetrabenazine  (AUSTEDO ) 6 MG TABS Take 6 mg by mouth 2 (two) times daily.    [provider]  furosemide  (LASIX ) 80 MG tablet Take 1 tablet (80 mg total) by mouth 2 (two) times daily. Patient taking differently: Take 40-80 mg by mouth See admin instructions. Take 80 mg in the morning and 40 mg at night 03/27/22 09/05/24  Kc, Lurlene Salon, MD  GVOKE HYPOPEN 2-PACK 1 MG/0.2ML SOAJ Inject 1 Dose as directed as needed (SUGARS SEVERELY LOW LESS THAN 55  NOT RESPONDING TO SUGAR INTAKE). 04/27/22   [provider]  hydrALAZINE  (APRESOLINE ) 50 MG tablet Take 50 mg by mouth 3 (three) times daily. 02/06/22   [provider]  insulin  aspart (NOVOLOG ) 100 UNIT/ML injection Inject into the skin continuous. Uses in omnipod insulin  pump    [provider]  Insulin  Disposable Pump (OMNIPOD DASH PODS, GEN 4,) MISC  02/19/22   [provider]  levothyroxine  (SYNTHROID ) 125 MCG tablet Take 125 mcg by mouth daily before breakfast. 02/20/22   [provider]  mycophenolate  (MYFORTIC ) 180 MG EC tablet Take 1 tablet (180 mg total) by mouth 2 (two) times daily. 02/28/22   Macdonald Savoy, MD  predniSONE  (DELTASONE ) 5 MG tablet Take 1 tablet (5 mg total) by mouth daily with breakfast. restart 5mg  dose 3/10 after completing prednisone  taper (2/24 - 3/9) 06/16/19   Wynetta Heckle, MD  rifaximin  (XIFAXAN ) 550 MG TABS tablet Take 1 tablet (550 mg total) by mouth 3 (three) times daily. 03/12/23   Rourk, Windsor Hatcher, MD  tacrolimus  (PROGRAF ) 0.5 MG capsule Take 1.5 mg by mouth 2 (two) times daily.     [provider]      Allergies    Patient has no known allergies.    Review of Systems   Review of Systems  Constitutional:  Negative for chills and  fever.  Respiratory:  Negative for shortness of breath.   Cardiovascular:  Negative for chest pain.  Gastrointestinal:  Negative for abdominal pain, nausea and vomiting.  Genitourinary:  Negative for flank pain.  Musculoskeletal:  Negative for back pain.  Neurological:  Positive for dizziness. Negative for light-headedness and headaches.  All other systems reviewed and are negative.   Physical Exam Updated Vital Signs BP 118/60 (BP Location: Right Arm)   Pulse 69   Temp 98.3 F (36.8 C)   Resp 18   Wt 85.7 kg   SpO2 92%   BMI 26.36 kg/m  Physical Exam Vitals and nursing note reviewed.  Constitutional:      Appearance: He is well-developed.  HENT:     Head:  Normocephalic and atraumatic.  Eyes:     General: No scleral icterus.    Pupils: Pupils are equal, round, and reactive to light.  Cardiovascular:     Heart sounds: Normal heart sounds.  Pulmonary:     Effort: Pulmonary effort is normal.     Breath sounds: Normal breath sounds. No wheezing.  Chest:     Chest wall: No tenderness.  Abdominal:     General: Bowel sounds are normal. There is no distension.     Palpations: Abdomen is soft.     Tenderness: There is no abdominal tenderness.  Musculoskeletal:        General: No tenderness or deformity.     Cervical back: Normal range of motion.  Skin:    General: Skin is warm and dry.  Neurological:     Mental Status: He is alert and oriented to person, place, and time.     ED Results / Procedures / Treatments   Labs (all labs ordered are listed, but only abnormal results are displayed) Labs Reviewed  CBC WITH DIFFERENTIAL/PLATELET - Abnormal; Notable for the following components:      Result Value   RBC 3.40 (*)    Hemoglobin 10.2 (*)    HCT 33.1 (*)    RDW 18.5 (*)    Lymphs Abs 0.6 (*)    Abs Immature Granulocytes 0.31 (*)    All other components within normal limits  COMPREHENSIVE METABOLIC PANEL WITH GFR - Abnormal; Notable for the following components:   Potassium 6.2 (*)    Chloride 112 (*)    Glucose, Bld 155 (*)    BUN 33 (*)    Creatinine, Ser 1.67 (*)    Calcium  8.7 (*)    Total Protein 5.8 (*)    Albumin 3.4 (*)    GFR, Estimated 44 (*)    All other components within normal limits    EKG EKG Interpretation Date/Time:  Wednesday Sep 18 2023 12:07:41 EDT Ventricular Rate:  63 PR Interval:  144 QRS Duration:  80 QT Interval:  408 QTC Calculation: 417 R Axis:   263  Text Interpretation: Normal sinus rhythm Right superior axis deviation Anterior infarct , age undetermined Abnormal ECG When compared with ECG of 17-May-2023 22:33, PREVIOUS ECG IS PRESENT no sig change from previous Confirmed by Wynetta Heckle  [phone]) on 09/18/2023 12:21:03 PM  Radiology No results found.  Procedures Procedures    Medications Ordered in ED Medications  sodium zirconium cyclosilicate  (LOKELMA ) packet 10 g (10 g Oral Given 09/18/23 1337)    ED Course/ Medical Decision Making/ A&P Clinical Course as of 09/18/23 1455  Wed Sep 18, 2023  1321 Potassium(!): 6.2 [JS]    Clinical Course User Index [JS] Jaquawn Saffran, PA-C  Medical Decision Making Amount and/or Complexity of Data Reviewed Labs: ordered. Decision-making details documented in ED Course. Radiology: ordered.  Risk Prescription drug management.   This patient presents to the ED for concern of abnormal labs, this involves a number of treatment options, and is a complaint that carries with it a high risk of complications and morbidity.    Co morbidities: Discussed in HPI   Brief History:  See HPI  EMR reviewed including pt PMHx, past surgical history and past visits to ER.   See HPI for more details   Lab Tests:  I ordered and independently interpreted labs.  The pertinent results include:    Labs notable for a potassium of 6.2, creatinine of 1.6 which is significantly improved from her prior visit 4 months ago.  And is currently not on dialysis.   Imaging Studies:  NAD. I personally reviewed all imaging studies and no acute abnormality found. I agree with radiology interpretation.    Cardiac Monitoring:  The patient was maintained on a cardiac monitor.  I personally viewed and interpreted the cardiac monitored which showed an underlying rhythm of: NSR EKG non-ischemic   Medicines ordered:  I ordered medication including lokelma   for hyperkalemia Reevaluation of the patient after these medicines showed that the patient stayed the same I have reviewed the patients home medicines and have made adjustments as needed  Consults:  I requested consultation with Dr. Gillie Lacy of nephrology,  and  discussed lab and imaging findings as well as pertinent plan - they recommend: Lokelma  10 g call specialist in the morning for further follow-up.  Reevaluation:  After the interventions noted above I re-evaluated patient and found that they have :stayed the same  Social Determinants of Health:  The patient's social determinants of health were a factor in the care of this patient   Problem List / ED Course:  Patient presented to the ED with a complaint of abnormal labs, patient is approximately a month and a few days.  He reports he has been doing well with his transplant.  However today after getting blood work at [facility], he was told to return as he had an elevated potassium of 6.4.  He is here asymptomatic not having any complaints at this time.  He did just finished eating as his wife reports that they returned from [employer] for a treat.  Labs on today's visit are remarkable for a potassium of 6.2, creatinine is improved from prior visits.  I did discuss this case with Dr. Gillie Lacy of nephrology who recommended Lokelma  10 g along with follow-up with his own nephrology team.  Patient does go to [facility] nephrology, will have him follow up outpatient.   Dispostion:  After consideration of the diagnostic results and the patients response to treatment, I feel that the patent would benefit from outpatient follow up with transplant team.   Portions of this note were generated with Dragon dictation software. Dictation errors may occur despite best attempts at proofreading.   Final Clinical Impression(s) / ED Diagnoses Final diagnoses:  Hyperkalemia    Rx / DC Orders ED Discharge Orders     None         Luellen Sages, PA-C 09/18/23 1455    Wynetta Heckle, MD 09/26/23 1147

## 2023-09-18 NOTE — ED Triage Notes (Addendum)
 Patient reports he just left [facility] for an appointment and labs this morning and was told his K+ is 6.2.  Patient is a kidney transplant patient.  Patient also endorses dizziness.  Patient gives verbal consent for MSE.

## 2023-09-19 DIAGNOSIS — W1839XA Other fall on same level, initial encounter: Secondary | ICD-10-CM | POA: Diagnosis not present

## 2023-09-19 DIAGNOSIS — E875 Hyperkalemia: Secondary | ICD-10-CM | POA: Diagnosis not present

## 2023-09-19 DIAGNOSIS — R197 Diarrhea, unspecified: Secondary | ICD-10-CM | POA: Diagnosis not present

## 2023-09-19 DIAGNOSIS — Z940 Kidney transplant status: Secondary | ICD-10-CM | POA: Diagnosis not present

## 2023-09-19 DIAGNOSIS — Y92002 Bathroom of unspecified non-institutional (private) residence single-family (private) house as the place of occurrence of the external cause: Secondary | ICD-10-CM | POA: Diagnosis not present

## 2023-09-20 DIAGNOSIS — N179 Acute kidney failure, unspecified: Secondary | ICD-10-CM | POA: Diagnosis not present

## 2023-09-20 DIAGNOSIS — Z940 Kidney transplant status: Secondary | ICD-10-CM | POA: Diagnosis not present

## 2023-09-20 DIAGNOSIS — T380X5A Adverse effect of glucocorticoids and synthetic analogues, initial encounter: Secondary | ICD-10-CM | POA: Diagnosis not present

## 2023-09-20 DIAGNOSIS — R339 Retention of urine, unspecified: Secondary | ICD-10-CM | POA: Diagnosis not present

## 2023-09-20 DIAGNOSIS — I951 Orthostatic hypotension: Secondary | ICD-10-CM | POA: Diagnosis not present

## 2023-09-20 DIAGNOSIS — D849 Immunodeficiency, unspecified: Secondary | ICD-10-CM | POA: Diagnosis not present

## 2023-09-20 DIAGNOSIS — R918 Other nonspecific abnormal finding of lung field: Secondary | ICD-10-CM | POA: Diagnosis not present

## 2023-09-20 DIAGNOSIS — Z792 Long term (current) use of antibiotics: Secondary | ICD-10-CM | POA: Diagnosis not present

## 2023-09-20 DIAGNOSIS — E1165 Type 2 diabetes mellitus with hyperglycemia: Secondary | ICD-10-CM | POA: Diagnosis not present

## 2023-09-20 DIAGNOSIS — E875 Hyperkalemia: Secondary | ICD-10-CM | POA: Diagnosis not present

## 2023-09-20 DIAGNOSIS — R0609 Other forms of dyspnea: Secondary | ICD-10-CM | POA: Diagnosis not present

## 2023-09-20 DIAGNOSIS — R739 Hyperglycemia, unspecified: Secondary | ICD-10-CM | POA: Diagnosis not present

## 2023-09-21 DIAGNOSIS — D849 Immunodeficiency, unspecified: Secondary | ICD-10-CM | POA: Diagnosis not present

## 2023-09-21 DIAGNOSIS — I951 Orthostatic hypotension: Secondary | ICD-10-CM | POA: Diagnosis not present

## 2023-09-21 DIAGNOSIS — R339 Retention of urine, unspecified: Secondary | ICD-10-CM | POA: Diagnosis not present

## 2023-09-21 DIAGNOSIS — N179 Acute kidney failure, unspecified: Secondary | ICD-10-CM | POA: Diagnosis not present

## 2023-09-21 DIAGNOSIS — Z940 Kidney transplant status: Secondary | ICD-10-CM | POA: Diagnosis not present

## 2023-09-21 DIAGNOSIS — Z792 Long term (current) use of antibiotics: Secondary | ICD-10-CM | POA: Diagnosis not present

## 2023-09-21 DIAGNOSIS — E875 Hyperkalemia: Secondary | ICD-10-CM | POA: Diagnosis not present

## 2023-09-23 ENCOUNTER — Telehealth: Payer: Self-pay

## 2023-09-23 NOTE — Transitions of Care (Post Inpatient/ED Visit) (Signed)
   09/23/2023  Name: Nathan Tyler MRN: [id number] DOB: June 05, 1955  Today's TOC FU Call Status: Today's TOC FU Call Status:: Unsuccessful Call (1st Attempt) Unsuccessful Call (1st Attempt) Date: 09/23/23  Attempted to reach the patient regarding the most recent Inpatient/ED visit at [facility] facility.   Follow Up Plan: Additional outreach attempts will be made to reach the patient to complete the Transitions of Care (Post Inpatient/ED visit) call.    Katheryn Pandy MSN, RN RN Case Sales executive Health  [facility] Office Hours M-F [phone] Direct Dial : [phone] Main Phone [phone]  Fax: [phone] [email or web address]

## 2023-09-24 ENCOUNTER — Telehealth: Payer: Self-pay

## 2023-09-24 DIAGNOSIS — B998 Other infectious disease: Secondary | ICD-10-CM | POA: Diagnosis not present

## 2023-09-24 DIAGNOSIS — Z940 Kidney transplant status: Secondary | ICD-10-CM | POA: Diagnosis not present

## 2023-09-24 DIAGNOSIS — D849 Immunodeficiency, unspecified: Secondary | ICD-10-CM | POA: Diagnosis not present

## 2023-09-24 DIAGNOSIS — Z5181 Encounter for therapeutic drug level monitoring: Secondary | ICD-10-CM | POA: Diagnosis not present

## 2023-09-24 NOTE — Transitions of Care (Post Inpatient/ED Visit) (Signed)
   09/24/2023  Name: BLAYDEN CONWELL MRN: [id number] DOB: [birth year]  Today's TOC FU Call Status: Today's TOC FU Call Status:: Unsuccessful Call (2nd Attempt) Unsuccessful Call (2nd Attempt) Date: 09/24/23  Attempted to reach the patient regarding the most recent Inpatient/ED visit.  Follow Up Plan: Additional outreach attempts will be made to reach the patient to complete the Transitions of Care (Post Inpatient/ED visit) call.    Katheryn Pandy MSN, RN RN Case Sales executive Health  [facility] Office Hours M-F [phone] Direct Dial : [phone] Main Phone [phone]  Fax: [phone] [email or web address]

## 2023-09-25 ENCOUNTER — Telehealth: Payer: Self-pay

## 2023-09-25 NOTE — Transitions of Care (Post Inpatient/ED Visit) (Signed)
   09/25/2023  Name: Nathan Tyler MRN: [id number] DOB: 10-27-1955  Today's TOC FU Call Status: Today's TOC FU Call Status:: Unsuccessful Call (3rd Attempt) Unsuccessful Call (3rd Attempt) Date: 09/25/23  Attempted to reach the patient regarding the most recent Inpatient/ED visit.  Follow Up Plan: No further outreach attempts will be made at this time. We have been unable to contact the patient.  Note: Chart review for this outreach indicates patient using nephrology at [facility] as primary care given transplant on 08/07/23.    Katheryn Pandy MSN, RN RN Case Sales executive Health  [facility] Office Hours M-F [phone] Direct Dial : [phone] Main Phone [phone]  Fax: [phone] [email or web address]

## 2023-09-30 DIAGNOSIS — L84 Corns and callosities: Secondary | ICD-10-CM | POA: Diagnosis not present

## 2023-09-30 DIAGNOSIS — E1142 Type 2 diabetes mellitus with diabetic polyneuropathy: Secondary | ICD-10-CM | POA: Diagnosis not present

## 2023-09-30 DIAGNOSIS — B351 Tinea unguium: Secondary | ICD-10-CM | POA: Diagnosis not present

## 2023-10-01 DIAGNOSIS — R339 Retention of urine, unspecified: Secondary | ICD-10-CM | POA: Diagnosis not present

## 2023-10-02 DIAGNOSIS — T380X5A Adverse effect of glucocorticoids and synthetic analogues, initial encounter: Secondary | ICD-10-CM | POA: Diagnosis not present

## 2023-10-02 DIAGNOSIS — T8611 Kidney transplant rejection: Secondary | ICD-10-CM | POA: Diagnosis not present

## 2023-10-02 DIAGNOSIS — I10 Essential (primary) hypertension: Secondary | ICD-10-CM | POA: Diagnosis not present

## 2023-10-02 DIAGNOSIS — Z940 Kidney transplant status: Secondary | ICD-10-CM | POA: Diagnosis not present

## 2023-10-02 DIAGNOSIS — Z9181 History of falling: Secondary | ICD-10-CM | POA: Diagnosis not present

## 2023-10-02 DIAGNOSIS — R739 Hyperglycemia, unspecified: Secondary | ICD-10-CM | POA: Diagnosis not present

## 2023-10-02 DIAGNOSIS — Z792 Long term (current) use of antibiotics: Secondary | ICD-10-CM | POA: Diagnosis not present

## 2023-10-02 DIAGNOSIS — D849 Immunodeficiency, unspecified: Secondary | ICD-10-CM | POA: Diagnosis not present

## 2023-10-02 DIAGNOSIS — G8918 Other acute postprocedural pain: Secondary | ICD-10-CM | POA: Diagnosis not present

## 2023-10-02 DIAGNOSIS — E875 Hyperkalemia: Secondary | ICD-10-CM | POA: Diagnosis not present

## 2023-10-02 DIAGNOSIS — R339 Retention of urine, unspecified: Secondary | ICD-10-CM | POA: Diagnosis not present

## 2023-10-09 DIAGNOSIS — Z5181 Encounter for therapeutic drug level monitoring: Secondary | ICD-10-CM | POA: Diagnosis not present

## 2023-10-09 DIAGNOSIS — D849 Immunodeficiency, unspecified: Secondary | ICD-10-CM | POA: Diagnosis not present

## 2023-10-09 DIAGNOSIS — Z940 Kidney transplant status: Secondary | ICD-10-CM | POA: Diagnosis not present

## 2023-10-09 DIAGNOSIS — B998 Other infectious disease: Secondary | ICD-10-CM | POA: Diagnosis not present

## 2023-10-10 NOTE — Unmapped (Signed)
 [facility] Refill Coordination Note    Specialty Medication(s) to be Shipped:   Neurology: [place]    Other medication(s) to be shipped: No additional medications requested for fill at this time     Gary Rogers, DOB: 09/16/1955  Phone: [phone] (home)       All above HIPAA information was verified with patient.     Was a Nurse, learning disability used for this call? No    Completed refill call assessment today to schedule patient's medication shipment from the [facility]  [phone]).  All relevant notes have been reviewed.     Specialty medication(s) and dose(s) confirmed: Regimen is correct and unchanged.   Changes to medications: Gary Rogers reports no changes at this time.  Changes to insurance: No  New side effects reported not previously addressed with a pharmacist or physician: None reported  Questions for the pharmacist: No    Confirmed patient received a Conservation officer, historic buildings and a Surveyor, mining with first shipment. The patient will receive a drug information handout for each medication shipped and additional FDA Medication Guides as required.       DISEASE/MEDICATION-SPECIFIC INFORMATION        N/A    SPECIALTY MEDICATION ADHERENCE     Medication Adherence    Patient reported X missed doses in the last month: 0  Specialty Medication: AUSTEDO 6 mg Tab (deutetrabenazine)  Patient is on additional specialty medications: No              Were doses missed due to medication being on hold? No    Austedo 6 mg: 12 days of medicine on hand        REFERRAL TO PHARMACIST     Referral to the pharmacist: Not needed      [facility]     Shipping address confirmed in Epic.     Cost and Payment: Patient has a $0 copay, payment information is not required.    Delivery Scheduled: Yes, Expected medication delivery date: 10/16/23.     Medication will be delivered via UPS to the prescription address in Epic WAM.    [name] CHRISTELLA Eagles   [facility] Specialty Technician

## 2023-10-14 DIAGNOSIS — R339 Retention of urine, unspecified: Secondary | ICD-10-CM | POA: Diagnosis not present

## 2023-10-15 MED FILL — AUSTEDO 6 MG TABLET: ORAL | 30 days supply | Qty: 60 | Fill #4

## 2023-10-18 DIAGNOSIS — D849 Immunodeficiency, unspecified: Secondary | ICD-10-CM | POA: Diagnosis not present

## 2023-10-18 DIAGNOSIS — Z940 Kidney transplant status: Secondary | ICD-10-CM | POA: Diagnosis not present

## 2023-10-18 DIAGNOSIS — B998 Other infectious disease: Secondary | ICD-10-CM | POA: Diagnosis not present

## 2023-10-18 DIAGNOSIS — Z5181 Encounter for therapeutic drug level monitoring: Secondary | ICD-10-CM | POA: Diagnosis not present

## 2023-10-19 ENCOUNTER — Emergency Department (HOSPITAL_COMMUNITY)

## 2023-10-19 ENCOUNTER — Other Ambulatory Visit: Payer: Self-pay

## 2023-10-19 ENCOUNTER — Inpatient Hospital Stay (HOSPITAL_COMMUNITY)

## 2023-10-19 ENCOUNTER — Inpatient Hospital Stay (HOSPITAL_COMMUNITY)
Admission: EM | Admit: 2023-10-19 | Discharge: 2023-11-22 | DRG: 208 | Disposition: E | Attending: Internal Medicine | Admitting: Internal Medicine

## 2023-10-19 DIAGNOSIS — G4730 Sleep apnea, unspecified: Secondary | ICD-10-CM | POA: Diagnosis present

## 2023-10-19 DIAGNOSIS — N1831 Chronic kidney disease, stage 3a: Secondary | ICD-10-CM | POA: Diagnosis present

## 2023-10-19 DIAGNOSIS — Z89421 Acquired absence of other right toe(s): Secondary | ICD-10-CM

## 2023-10-19 DIAGNOSIS — J690 Pneumonitis due to inhalation of food and vomit: Secondary | ICD-10-CM | POA: Diagnosis present

## 2023-10-19 DIAGNOSIS — K7200 Acute and subacute hepatic failure without coma: Secondary | ICD-10-CM | POA: Diagnosis not present

## 2023-10-19 DIAGNOSIS — I499 Cardiac arrhythmia, unspecified: Secondary | ICD-10-CM | POA: Diagnosis not present

## 2023-10-19 DIAGNOSIS — Z4682 Encounter for fitting and adjustment of non-vascular catheter: Secondary | ICD-10-CM | POA: Diagnosis not present

## 2023-10-19 DIAGNOSIS — Z79899 Other long term (current) drug therapy: Secondary | ICD-10-CM

## 2023-10-19 DIAGNOSIS — Y848 Other medical procedures as the cause of abnormal reaction of the patient, or of later complication, without mention of misadventure at the time of the procedure: Secondary | ICD-10-CM | POA: Diagnosis present

## 2023-10-19 DIAGNOSIS — B961 Klebsiella pneumoniae [K. pneumoniae] as the cause of diseases classified elsewhere: Secondary | ICD-10-CM | POA: Diagnosis present

## 2023-10-19 DIAGNOSIS — T8619 Other complication of kidney transplant: Secondary | ICD-10-CM | POA: Diagnosis present

## 2023-10-19 DIAGNOSIS — R680 Hypothermia, not associated with low environmental temperature: Secondary | ICD-10-CM | POA: Diagnosis present

## 2023-10-19 DIAGNOSIS — I469 Cardiac arrest, cause unspecified: Principal | ICD-10-CM | POA: Diagnosis present

## 2023-10-19 DIAGNOSIS — I110 Hypertensive heart disease with heart failure: Secondary | ICD-10-CM | POA: Diagnosis present

## 2023-10-19 DIAGNOSIS — R7401 Elevation of levels of liver transaminase levels: Secondary | ICD-10-CM

## 2023-10-19 DIAGNOSIS — S2231XA Fracture of one rib, right side, initial encounter for closed fracture: Secondary | ICD-10-CM | POA: Diagnosis not present

## 2023-10-19 DIAGNOSIS — J9601 Acute respiratory failure with hypoxia: Principal | ICD-10-CM | POA: Diagnosis present

## 2023-10-19 DIAGNOSIS — Y830 Surgical operation with transplant of whole organ as the cause of abnormal reaction of the patient, or of later complication, without mention of misadventure at the time of the procedure: Secondary | ICD-10-CM | POA: Diagnosis present

## 2023-10-19 DIAGNOSIS — Z515 Encounter for palliative care: Secondary | ICD-10-CM

## 2023-10-19 DIAGNOSIS — E1122 Type 2 diabetes mellitus with diabetic chronic kidney disease: Secondary | ICD-10-CM | POA: Diagnosis not present

## 2023-10-19 DIAGNOSIS — I130 Hypertensive heart and chronic kidney disease with heart failure and stage 1 through stage 4 chronic kidney disease, or unspecified chronic kidney disease: Secondary | ICD-10-CM | POA: Diagnosis present

## 2023-10-19 DIAGNOSIS — N170 Acute kidney failure with tubular necrosis: Secondary | ICD-10-CM | POA: Diagnosis present

## 2023-10-19 DIAGNOSIS — R001 Bradycardia, unspecified: Secondary | ICD-10-CM | POA: Diagnosis not present

## 2023-10-19 DIAGNOSIS — I2489 Other forms of acute ischemic heart disease: Secondary | ICD-10-CM | POA: Diagnosis present

## 2023-10-19 DIAGNOSIS — Z940 Kidney transplant status: Secondary | ICD-10-CM | POA: Diagnosis not present

## 2023-10-19 DIAGNOSIS — R569 Unspecified convulsions: Secondary | ICD-10-CM | POA: Diagnosis not present

## 2023-10-19 DIAGNOSIS — J90 Pleural effusion, not elsewhere classified: Secondary | ICD-10-CM | POA: Diagnosis not present

## 2023-10-19 DIAGNOSIS — E039 Hypothyroidism, unspecified: Secondary | ICD-10-CM | POA: Diagnosis present

## 2023-10-19 DIAGNOSIS — E8729 Other acidosis: Secondary | ICD-10-CM

## 2023-10-19 DIAGNOSIS — Z89422 Acquired absence of other left toe(s): Secondary | ICD-10-CM

## 2023-10-19 DIAGNOSIS — E119 Type 2 diabetes mellitus without complications: Secondary | ICD-10-CM | POA: Diagnosis not present

## 2023-10-19 DIAGNOSIS — S0990XA Unspecified injury of head, initial encounter: Secondary | ICD-10-CM | POA: Diagnosis not present

## 2023-10-19 DIAGNOSIS — R918 Other nonspecific abnormal finding of lung field: Secondary | ICD-10-CM | POA: Diagnosis not present

## 2023-10-19 DIAGNOSIS — Z79624 Long term (current) use of inhibitors of nucleotide synthesis: Secondary | ICD-10-CM

## 2023-10-19 DIAGNOSIS — I468 Cardiac arrest due to other underlying condition: Secondary | ICD-10-CM | POA: Diagnosis not present

## 2023-10-19 DIAGNOSIS — N1330 Unspecified hydronephrosis: Secondary | ICD-10-CM | POA: Diagnosis not present

## 2023-10-19 DIAGNOSIS — G255 Other chorea: Secondary | ICD-10-CM | POA: Diagnosis present

## 2023-10-19 DIAGNOSIS — S199XXA Unspecified injury of neck, initial encounter: Secondary | ICD-10-CM | POA: Diagnosis not present

## 2023-10-19 DIAGNOSIS — Z860101 Personal history of adenomatous and serrated colon polyps: Secondary | ICD-10-CM

## 2023-10-19 DIAGNOSIS — G4089 Other seizures: Secondary | ICD-10-CM | POA: Diagnosis not present

## 2023-10-19 DIAGNOSIS — Z79890 Hormone replacement therapy (postmenopausal): Secondary | ICD-10-CM

## 2023-10-19 DIAGNOSIS — D84821 Immunodeficiency due to drugs: Secondary | ICD-10-CM | POA: Diagnosis present

## 2023-10-19 DIAGNOSIS — G931 Anoxic brain damage, not elsewhere classified: Secondary | ICD-10-CM | POA: Diagnosis not present

## 2023-10-19 DIAGNOSIS — N179 Acute kidney failure, unspecified: Secondary | ICD-10-CM | POA: Diagnosis not present

## 2023-10-19 DIAGNOSIS — R7881 Bacteremia: Secondary | ICD-10-CM | POA: Diagnosis present

## 2023-10-19 DIAGNOSIS — R55 Syncope and collapse: Secondary | ICD-10-CM | POA: Diagnosis not present

## 2023-10-19 DIAGNOSIS — Z8616 Personal history of COVID-19: Secondary | ICD-10-CM

## 2023-10-19 DIAGNOSIS — E1165 Type 2 diabetes mellitus with hyperglycemia: Secondary | ICD-10-CM | POA: Diagnosis not present

## 2023-10-19 DIAGNOSIS — D849 Immunodeficiency, unspecified: Secondary | ICD-10-CM | POA: Diagnosis not present

## 2023-10-19 DIAGNOSIS — N2889 Other specified disorders of kidney and ureter: Secondary | ICD-10-CM | POA: Diagnosis present

## 2023-10-19 DIAGNOSIS — Z79621 Long term (current) use of calcineurin inhibitor: Secondary | ICD-10-CM

## 2023-10-19 DIAGNOSIS — D631 Anemia in chronic kidney disease: Secondary | ICD-10-CM | POA: Diagnosis not present

## 2023-10-19 DIAGNOSIS — I5032 Chronic diastolic (congestive) heart failure: Secondary | ICD-10-CM | POA: Diagnosis not present

## 2023-10-19 DIAGNOSIS — I959 Hypotension, unspecified: Secondary | ICD-10-CM | POA: Diagnosis present

## 2023-10-19 DIAGNOSIS — D509 Iron deficiency anemia, unspecified: Secondary | ICD-10-CM

## 2023-10-19 DIAGNOSIS — E876 Hypokalemia: Secondary | ICD-10-CM | POA: Diagnosis present

## 2023-10-19 DIAGNOSIS — Z66 Do not resuscitate: Secondary | ICD-10-CM | POA: Diagnosis present

## 2023-10-19 DIAGNOSIS — B1920 Unspecified viral hepatitis C without hepatic coma: Secondary | ICD-10-CM | POA: Diagnosis present

## 2023-10-19 DIAGNOSIS — M96A3 Multiple fractures of ribs associated with chest compression and cardiopulmonary resuscitation: Secondary | ICD-10-CM | POA: Diagnosis present

## 2023-10-19 DIAGNOSIS — R404 Transient alteration of awareness: Secondary | ICD-10-CM | POA: Diagnosis not present

## 2023-10-19 DIAGNOSIS — E872 Acidosis, unspecified: Secondary | ICD-10-CM | POA: Diagnosis present

## 2023-10-19 DIAGNOSIS — Z794 Long term (current) use of insulin: Secondary | ICD-10-CM

## 2023-10-19 DIAGNOSIS — E7800 Pure hypercholesterolemia, unspecified: Secondary | ICD-10-CM | POA: Diagnosis present

## 2023-10-19 DIAGNOSIS — N186 End stage renal disease: Secondary | ICD-10-CM | POA: Diagnosis not present

## 2023-10-19 DIAGNOSIS — Z800 Family history of malignant neoplasm of digestive organs: Secondary | ICD-10-CM

## 2023-10-19 DIAGNOSIS — I4510 Unspecified right bundle-branch block: Secondary | ICD-10-CM | POA: Diagnosis not present

## 2023-10-19 DIAGNOSIS — D539 Nutritional anemia, unspecified: Secondary | ICD-10-CM | POA: Diagnosis present

## 2023-10-19 DIAGNOSIS — G253 Myoclonus: Secondary | ICD-10-CM | POA: Diagnosis present

## 2023-10-19 LAB — CBC WITH DIFFERENTIAL/PLATELET
Abs Immature Granulocytes: 0.48 10*3/uL — ABNORMAL HIGH (ref 0.00–0.07)
Basophils Absolute: 0 10*3/uL (ref 0.0–0.1)
Basophils Relative: 0 %
Eosinophils Absolute: 0 10*3/uL (ref 0.0–0.5)
Eosinophils Relative: 1 %
HCT: 22.4 % — ABNORMAL LOW (ref 39.0–52.0)
Hemoglobin: 6.5 g/dL — CL (ref 13.0–17.0)
Immature Granulocytes: 11 %
Lymphocytes Relative: 24 %
Lymphs Abs: 1.1 10*3/uL (ref 0.7–4.0)
MCH: 29.4 pg (ref 26.0–34.0)
MCHC: 29 g/dL — ABNORMAL LOW (ref 30.0–36.0)
MCV: 101.4 fL — ABNORMAL HIGH (ref 80.0–100.0)
Monocytes Absolute: 0.1 10*3/uL (ref 0.1–1.0)
Monocytes Relative: 3 %
Neutro Abs: 2.7 10*3/uL (ref 1.7–7.7)
Neutrophils Relative %: 61 %
Platelets: 498 10*3/uL — ABNORMAL HIGH (ref 150–400)
RBC: 2.21 MIL/uL — ABNORMAL LOW (ref 4.22–5.81)
RDW: 16 % — ABNORMAL HIGH (ref 11.5–15.5)
WBC: 4.4 10*3/uL (ref 4.0–10.5)
nRBC: 0.5 % — ABNORMAL HIGH (ref 0.0–0.2)

## 2023-10-19 LAB — COMPREHENSIVE METABOLIC PANEL WITH GFR
ALT: 83 U/L — ABNORMAL HIGH (ref 0–44)
AST: 141 U/L — ABNORMAL HIGH (ref 15–41)
Albumin: 1.7 g/dL — ABNORMAL LOW (ref 3.5–5.0)
Alkaline Phosphatase: 89 U/L (ref 38–126)
Anion gap: 14 (ref 5–15)
BUN: 19 mg/dL (ref 8–23)
CO2: 22 mmol/L (ref 22–32)
Calcium: 7.3 mg/dL — ABNORMAL LOW (ref 8.9–10.3)
Chloride: 106 mmol/L (ref 98–111)
Creatinine, Ser: 1.68 mg/dL — ABNORMAL HIGH (ref 0.61–1.24)
GFR, Estimated: 43 mL/min — ABNORMAL LOW (ref >60)
Glucose, Bld: 288 mg/dL — ABNORMAL HIGH (ref 70–99)
Potassium: 3.2 mmol/L — ABNORMAL LOW (ref 3.5–5.1)
Sodium: 142 mmol/L (ref 135–145)
Total Bilirubin: <0.2 mg/dL (ref 0.0–1.2)
Total Protein: 4.1 g/dL — ABNORMAL LOW (ref 6.5–8.1)

## 2023-10-19 LAB — TROPONIN I (HIGH SENSITIVITY): Troponin I (High Sensitivity): 46 ng/L — ABNORMAL HIGH (ref <18)

## 2023-10-19 LAB — GLUCOSE, CAPILLARY: Glucose-Capillary: 272 mg/dL — ABNORMAL HIGH (ref 70–99)

## 2023-10-19 LAB — I-STAT CHEM 8, ED
BUN: 20 mg/dL (ref 8–23)
Calcium, Ion: 1.06 mmol/L — ABNORMAL LOW (ref 1.15–1.40)
Chloride: 103 mmol/L (ref 98–111)
Creatinine, Ser: 1.6 mg/dL — ABNORMAL HIGH (ref 0.61–1.24)
Glucose, Bld: 278 mg/dL — ABNORMAL HIGH (ref 70–99)
HCT: 19 % — ABNORMAL LOW (ref 39.0–52.0)
Hemoglobin: 6.5 g/dL — CL (ref 13.0–17.0)
Potassium: 3.1 mmol/L — ABNORMAL LOW (ref 3.5–5.1)
Sodium: 141 mmol/L (ref 135–145)
TCO2: 22 mmol/L (ref 22–32)

## 2023-10-19 LAB — I-STAT CG4 LACTIC ACID, ED: Lactic Acid, Venous: 5.2 mmol/L (ref 0.5–1.9)

## 2023-10-19 LAB — ETHANOL: Alcohol, Ethyl (B): <15 mg/dL (ref <15)

## 2023-10-19 MED ORDER — ORAL CARE MOUTH RINSE
15.0000 mL | OROMUCOSAL | Status: DC | PRN
Start: 2023-10-19 — End: 2023-10-22

## 2023-10-19 MED ORDER — PROPOFOL 1000 MG/100ML IV EMUL: 0.0000 ug/kg/min | INTRAVENOUS | Status: DC

## 2023-10-19 MED ORDER — PROPOFOL 1000 MG/100ML IV EMUL
INTRAVENOUS | Status: AC
  Administered 2023-10-19: 20 ug/kg/min via INTRAVENOUS
  Filled 2023-10-19: qty 100

## 2023-10-19 MED ORDER — IOHEXOL 350 MG/ML SOLN
75.0000 mL | Freq: Once | INTRAVENOUS | Status: AC | PRN
  Administered 2023-10-19: 75 mL via INTRAVENOUS

## 2023-10-19 MED ORDER — HEPARIN SODIUM (PORCINE) 5000 UNIT/ML IJ SOLN: 5000.0000 [IU] | Freq: Three times a day (TID) | INTRAMUSCULAR | Status: DC

## 2023-10-19 MED ORDER — ACETAMINOPHEN 160 MG/5ML PO SOLN
650.0000 mg | ORAL | Status: AC
  Administered 2023-10-20 – 2023-10-21 (×7): 650 mg
  Filled 2023-10-19 (×7): qty 20.3

## 2023-10-19 MED ORDER — MIDAZOLAM HCL 2 MG/2ML IJ SOLN
1.0000 mg | INTRAMUSCULAR | Status: DC | PRN
  Administered 2023-10-19: 1 mg via INTRAVENOUS
  Filled 2023-10-19: qty 2

## 2023-10-19 MED ORDER — NOREPINEPHRINE 4 MG/250ML-% IV SOLN: 0.0000 ug/min | INTRAVENOUS | Status: DC

## 2023-10-19 MED ORDER — FAMOTIDINE 20 MG PO TABS
20.0000 mg | ORAL_TABLET | Freq: Two times a day (BID) | ORAL | Status: DC
Start: 2023-10-20 — End: 2023-10-21
  Administered 2023-10-20 (×2): 20 mg
  Filled 2023-10-19 (×2): qty 1

## 2023-10-19 MED ORDER — ACETAMINOPHEN 160 MG/5ML PO SOLN: 650.0000 mg | ORAL | Status: DC | PRN

## 2023-10-19 MED ORDER — FENTANYL CITRATE PF 50 MCG/ML IJ SOSY
25.0000 ug | PREFILLED_SYRINGE | INTRAMUSCULAR | Status: DC | PRN
  Administered 2023-10-22: 25 ug via INTRAVENOUS
  Filled 2023-10-19: qty 1

## 2023-10-19 MED ORDER — ORAL CARE MOUTH RINSE: 15.0000 mL | OROMUCOSAL | Status: DC

## 2023-10-19 MED ORDER — NOREPINEPHRINE 4 MG/250ML-% IV SOLN
INTRAVENOUS | Status: AC
  Administered 2023-10-19: 2 ug/min via INTRAVENOUS
  Filled 2023-10-19: qty 250

## 2023-10-19 MED ORDER — ACETAMINOPHEN 325 MG PO TABS
650.0000 mg | ORAL_TABLET | ORAL | Status: AC
  Administered 2023-10-20 (×2): 650 mg via ORAL
  Filled 2023-10-19 (×2): qty 2

## 2023-10-19 MED ORDER — ACETAMINOPHEN 650 MG RE SUPP: 650.0000 mg | RECTAL | Status: DC | PRN

## 2023-10-19 MED ORDER — SODIUM CHLORIDE 0.9 % IV SOLN
250.0000 mL | INTRAVENOUS | Status: AC
  Administered 2023-10-20: 1000 mL via INTRAVENOUS

## 2023-10-19 MED ORDER — BUSPIRONE HCL 10 MG PO TABS: 30.0000 mg | ORAL_TABLET | Freq: Three times a day (TID) | ORAL | Status: AC | PRN

## 2023-10-19 MED ORDER — ACETAMINOPHEN 650 MG RE SUPP: 650.0000 mg | RECTAL | Status: AC

## 2023-10-19 MED ORDER — FENTANYL CITRATE PF 50 MCG/ML IJ SOSY
25.0000 ug | PREFILLED_SYRINGE | INTRAMUSCULAR | Status: DC | PRN
  Administered 2023-10-19: 50 ug via INTRAVENOUS
  Filled 2023-10-19: qty 1

## 2023-10-19 MED ORDER — POLYETHYLENE GLYCOL 3350 17 G PO PACK
17.0000 g | PACK | Freq: Every day | ORAL | Status: DC
  Administered 2023-10-20 – 2023-10-21 (×2): 17 g
  Filled 2023-10-19 (×2): qty 1

## 2023-10-19 MED ORDER — ONDANSETRON HCL 4 MG/2ML IJ SOLN: 4.0000 mg | Freq: Four times a day (QID) | INTRAMUSCULAR | Status: DC | PRN

## 2023-10-19 MED ORDER — MAGNESIUM SULFATE 2 GM/50ML IV SOLN: 2.0000 g | Freq: Once | INTRAVENOUS | Status: DC | PRN

## 2023-10-19 MED ORDER — ORAL CARE MOUTH RINSE
15.0000 mL | OROMUCOSAL | Status: DC
  Administered 2023-10-20 – 2023-10-22 (×29): 15 mL via OROMUCOSAL

## 2023-10-19 MED ORDER — DOCUSATE SODIUM 50 MG/5ML PO LIQD
100.0000 mg | Freq: Two times a day (BID) | ORAL | Status: DC
Start: 2023-10-20 — End: 2023-10-21
  Administered 2023-10-20 – 2023-10-21 (×3): 100 mg
  Filled 2023-10-19 (×3): qty 10

## 2023-10-19 MED ORDER — ORAL CARE MOUTH RINSE
15.0000 mL | OROMUCOSAL | Status: DC | PRN
Start: 2023-10-19 — End: 2023-10-19

## 2023-10-19 MED ORDER — SODIUM CHLORIDE 0.9% IV SOLUTION: Freq: Once | INTRAVENOUS | Status: DC

## 2023-10-19 MED ORDER — ACETAMINOPHEN 325 MG PO TABS: 650.0000 mg | ORAL_TABLET | ORAL | Status: DC | PRN

## 2023-10-19 MED ORDER — NOREPINEPHRINE 4 MG/250ML-% IV SOLN: 2.0000 ug/min | INTRAVENOUS | Status: DC

## 2023-10-19 MED ORDER — CHLORHEXIDINE GLUCONATE CLOTH 2 % EX PADS
6.0000 | MEDICATED_PAD | Freq: Every day | CUTANEOUS | Status: DC
  Administered 2023-10-20 – 2023-10-22 (×3): 6 via TOPICAL

## 2023-10-19 NOTE — H&P (Signed)
 NAME:  Nathan Tyler, MRN:  [id number], DOB:  October 21, 1955, LOS: 0 ADMISSION DATE:  10/19/2023, CONSULTATION DATE:  10/19/2023 REFERRING MD: [place], MD, CHIEF COMPLAINT:  Cardiac Arrest  History of Present Illness:  68 y/o male who has a PMH for HTN, Chorea movement disorder, Hypothyroidism, and ESRD 2/2 diabetic nephropathy s/p LDKT (2010, from sister[facility], no rejection hx), now on iHD via AVF since 06/2023 who BIBEMS after cardiac arrest.  The patient previously had a kidney transplant for about 13 years placed at [facility] [place]  and this stopped working.  He was placed on transplant list at [facility] 2 years ago and  was on Dialysis x 1 month through right arm fistula when a matched kidney was found and in April he underwent another Kidney transplant at [facility].  He had urinary retention since the transplant and has had a Foley cath since.  He has been working with Urology on this.  He was seen at [facility] yesterday and apparently everything was fine.  His wife went out to walk the dog and says she was out for about 5 min and came back to find him on the floor.  He was pulseless, she called 911 who recommended she start CPR which she did.  She says EMS arrived within 5 min and continued with CPR.  ROSC achieved in about 20 min total and prior to ED arrival.  He was intubated.  He received 3 epinephrines and then was started on Epinephrine  drip which was weaned off in ED. He has been having diarrhea recently.  In ED his K+ was 3.1, Cr 1.60, Albumin 1.7, LA 5.2, Hgb 6.5.  Patient vented in ED and having jerking movements. Pertinent  Medical History  HTN, hypothyroidism, and ESRD 2/2 diabetic nephropathy s/p LDKT (2010, from sister[facility], no rejection hx), now on iHD via AVF since 06/2023   Significant Hospital Events: Including procedures, antibiotic start and stop dates in addition to other pertinent events   6/28: Admit to ICU  Interim History / Subjective:  N/a  Objective    Blood pressure  (!) 130/57, pulse 76, temperature (!) 97.3 F (36.3 C), temperature source Temporal, resp. rate (!) 23, height 5' 11 (1.803 m), SpO2 100%.    Vent Mode: PRVC FiO2 (%):  [100 %] 100 % Set Rate:  [15 bmp] 15 bmp Vt Set:  [500 mL-600 mL] 600 mL PEEP:  [5 cmH20] 5 cmH20 Plateau Pressure:  [18 cmH20] 18 cmH20  No intake or output data in the 24 hours ending 10/19/23 2307 There were no vitals filed for this visit.  Examination: General: intubated, NAD, jerking movements HENT: pupils equal sluggish 2mm, no icterus, ETT in place Lungs: CTA no wheezes no rales, diminished in bases Cardiovascular: Reg s1s2 no murmurs or gallops Abdomen: soft nt nd bs pos, obese Extremities: no cyanosis, clubbing or edema Neuro: jerking movements, sedated and intubated, has gag GU: chronic Foley  Resolved problem list   Assessment and Plan  Asystolic Cardiac Arrest Lower K+, awaiting Mg level-replace as needed Not clear etiology of his cardiac arrest Neurology evaluation for his jerking movents-myoclonus suggesting Anoxic injury Acute Hypoxic Respiratory Failure Continue with with vent management Follow ABGs Anemia (Macrocytic) Transfuse 1 unit Assess for autoimmune cause of anemia No signs of bleeding Lactic Acidosis IV fluids Monitor levels AKI/S/P Kidney transplant Monitor levels, likely from ATN from arrest Continue with immune suppressing agents Transaminitis Likely from arrest and low flow state during arrest Rib fractures  Likely from CPR H/o DM Finger sticks and sliding scale  Best Practice (right click and Reselect all SmartList Selections daily)   Diet/type: NPO DVT prophylaxis prophylactic heparin   Pressure ulcer(s): N/A GI prophylaxis: H2B Lines: N/A Foley:  Yes, and it is still needed Code Status:  full code   Labs   CBC: Recent Labs  Lab 10/19/23 2122 10/19/23 2124  WBC  --  4.4  NEUTROABS  --  PENDING  HGB 6.5* 6.5*  HCT 19.0* 22.4*  MCV  --  101.4*  PLT   --  498*    Basic Metabolic Panel: Recent Labs  Lab 10/19/23 2122 10/19/23 2124  NA 141 142  K 3.1* 3.2*  CL 103 106  CO2  --  22  GLUCOSE 278* 288*  BUN 20 19  CREATININE 1.60* 1.68*  CALCIUM   --  7.3*   GFR: CrCl cannot be calculated (Unknown ideal weight.). Recent Labs  Lab 10/19/23 2124 10/19/23 2126  WBC 4.4  --   LATICACIDVEN  --  5.2*    Liver Function Tests: Recent Labs  Lab 10/19/23 2124  AST 141*  ALT 83*  ALKPHOS 89  BILITOT <0.2  PROT 4.1*  ALBUMIN 1.7*   No results for input(s): LIPASE, AMYLASE in the last 168 hours. No results for input(s): AMMONIA in the last 168 hours.  ABG    Component Value Date/Time   PHART 7.24 (L) 03/12/2022 0818   PCO2ART 61 (H) 03/12/2022 0818   PO2ART 58 (L) 03/12/2022 0818   HCO3 26.0 03/12/2022 0818   TCO2 22 10/19/2023 2122   ACIDBASEDEF 1.8 03/12/2022 0818   O2SAT 94.2 03/12/2022 0818     Coagulation Profile: No results for input(s): INR, PROTIME in the last 168 hours.  Cardiac Enzymes: No results for input(s): CKTOTAL, CKMB, CKMBINDEX, TROPONINI in the last 168 hours.  HbA1C: Hgb A1c MFr Bld  Date/Time Value Ref Range Status  02/22/2022 06:23 PM 5.7 (H) 4.8 - 5.6 % Final    Comment:    (NOTE) Pre diabetes:          5.7%-6.4%  Diabetes:              >6.4%  Glycemic control for   <7.0% adults with diabetes   05/31/2019 01:00 PM 8.5 (H) 4.8 - 5.6 % Final    Comment:    (NOTE) Pre diabetes:          5.7%-6.4% Diabetes:              >6.4% Glycemic control for   <7.0% adults with diabetes     CBG: No results for input(s): GLUCAP in the last 168 hours.  Review of Systems:   Intubated unable to get ROS  Past Medical History:  He,  has a past medical history of Acute metabolic encephalopathy (02/22/2022), Acute renal failure superimposed on stage 4 chronic kidney disease (HCC) (02/22/2022), Acute respiratory failure with hypoxia (HCC) (06/03/2019), Anemia, Cancer (HCC),  Chorea, COVID-19, COVID-19 virus infection (05/31/2019), Diabetes mellitus without complication (HCC), Dyspnea, Essential hypertension, Hepatitis, Hepatitis C antibody positive in blood (03/12/2017), Hypercholesteremia, Hypothyroidism, Neuromuscular disorder (HCC), Pneumonia, PONV (postoperative nausea and vomiting), Renal failure, and Sleep apnea.   Surgical History:   Past Surgical History:  Procedure Laterality Date   AMPUTATION Right 06/27/2016   Procedure: AMPUTATION 5TH TOE RIGHT FOOT;  Surgeon: Morene Anon, DPM;  Location: [facility];  Service: Podiatry;  Laterality: Right;   AMPUTATION TOE Left 09/02/2015   Procedure: AMPUTATION  2ND TOE  LEFT FOOT;  Surgeon: Oneil Budge, MD;  Location: [facility];  Service: General;  Laterality: Left;   AV FISTULA PLACEMENT Right 06/27/2022   Procedure: RIGHT BRACHIOCEPHALIC ARTERIOVENOUS (AV) FISTULA CREATION;  Surgeon: Magda Debby SAILOR, MD;  Location: [facility];  Service: Vascular;  Laterality: Right;   BASCILIC VEIN TRANSPOSITION Right 09/10/2022   Procedure: RIGHT UPPER ARM SECOND STAGE BRACHIAL CEPHALIC VEIN TRANSPOSITION;  Surgeon: Magda Debby SAILOR, MD;  Location: [facility] OR;  Service: Vascular;  Laterality: Right;   BIOPSY  11/24/2021   Procedure: BIOPSY;  Surgeon: Shaaron Lamar HERO, MD;  Location: [facility];  Service: Endoscopy;;   CATARACT EXTRACTION Bilateral    COLONOSCOPY  01/2014   done in [place] . patient reports h/o colon polyps on TCS in 2010, 2015 colonoscopy normal but advised to come back in 2020.    COLONOSCOPY WITH PROPOFOL  N/A 01/19/2019   Procedure: COLONOSCOPY WITH PROPOFOL ;  Surgeon: Shaaron Lamar HERO, MD;  Location: [facility];  Service: Endoscopy;  Laterality: N/A;  9:45am   COLONOSCOPY WITH PROPOFOL  N/A 11/24/2021   pancolonic diverticulosis, three polyps, adenomas, negative microscopic colitis   IR FLUORO GUIDE CV LINE RIGHT  03/14/2022   IR PERC TUN PERIT CATH WO PORT S&I /IMAG  03/14/2022   IR REMOVAL TUN CV CATH W/O FL   03/19/2022   IR US  GUIDE VASC ACCESS RIGHT  03/14/2022   KIDNEY TRANSPLANT Right 2010   POLYPECTOMY  01/19/2019   Procedure: POLYPECTOMY;  Surgeon: Shaaron Lamar HERO, MD;  [facility]: [facility];  Service: Endoscopy;;   POLYPECTOMY  11/24/2021   Procedure: POLYPECTOMY;  Surgeon: Shaaron Lamar HERO, MD;  Location: [facility];  Service: Endoscopy;;     Social History:   reports that he has never smoked. He has never been exposed to tobacco smoke. He has never used smokeless tobacco. He reports that he does not currently use alcohol. He reports that he does not currently use drugs after having used the following drugs: Crack cocaine.   Family History:  His family history includes Colon cancer (age of onset: 31) in his maternal aunt.   Allergies No Known Allergies   Home Medications  Prior to Admission medications   Medication Sig Start Date End Date Taking? Authorizing Provider  acetaminophen  (TYLENOL ) 325 MG tablet Take 650 mg by mouth. 08/08/23  Yes [provider]  aspirin EC 81 MG tablet Take 81 mg by mouth. 09/09/23 09/08/24 Yes [provider]  insulin  aspart (NOVOLOG ) 100 UNIT/ML injection Inject 8-12 Units into the skin continuous. Uses in omnipod insulin  pump   Yes [provider]  amLODipine  (NORVASC ) 10 MG tablet Take 10 mg by mouth in the morning. 12/28/21   [provider]  atorvastatin  (LIPITOR) 10 MG tablet Take 10 mg by mouth in the morning.    [provider]  carvedilol  (COREG ) 6.25 MG tablet Take 6.25 mg by mouth 2 (two) times daily with a meal.    [provider]  Deutetrabenazine  (AUSTEDO ) 6 MG TABS Take 6 mg by mouth 2 (two) times daily.    [provider]  furosemide  (LASIX ) 80 MG tablet Take 1 tablet (80 mg total) by mouth 2 (two) times daily. Patient taking differently: Take 40-80 mg by mouth See admin instructions. Take 80 mg in the morning and 40 mg at night 03/27/22 09/05/24  Kc, Mennie, MD  GVOKE HYPOPEN  2-PACK 1 MG/0.2ML SOAJ Inject 1 Dose as directed as needed (SUGARS SEVERELY LOW LESS THAN 55 NOT  RESPONDING TO SUGAR INTAKE). 04/27/22   [provider]  hydrALAZINE  (APRESOLINE ) 50 MG tablet Take 50 mg by mouth 3 (three) times daily. 02/06/22   [provider]  Insulin  Disposable Pump (OMNIPOD DASH PODS, GEN 4,) MISC  02/19/22   [provider]  levothyroxine  (SYNTHROID ) 125 MCG tablet Take 125 mcg by mouth daily before breakfast. 02/20/22   [provider]  mycophenolate  (MYFORTIC ) 180 MG EC tablet Take 1 tablet (180 mg total) by mouth 2 (two) times daily. 02/28/22   Odell Celinda Balo, MD  predniSONE  (DELTASONE ) 5 MG tablet Take 1 tablet (5 mg total) by mouth daily with breakfast. restart 5mg  dose 3/10 after completing prednisone  taper (2/24 - 3/9) 06/16/19   Bryn Bernardino NOVAK, MD  rifaximin  (XIFAXAN ) 550 MG TABS tablet Take 1 tablet (550 mg total) by mouth 3 (three) times daily. 03/12/23   Rourk, Lamar HERO, MD  tacrolimus  (PROGRAF ) 0.5 MG capsule Take 1.5 mg by mouth 2 (two) times daily.     [provider]     Critical care time: 33   The patient is critically ill with multiple organ system failure and requires high complexity decision making for assessment and support, frequent evaluation and titration of therapies, advanced monitoring, review of radiographic studies and interpretation of complex data.   Critical Care Time devoted to patient care services, exclusive of separately billable procedures, described in this note is 36 minutes.   Orlin Fairly, MD [facility] See Amion for pager  If no response to pager , please call [phone] until 7pm After 7:00 pm call Elink  [phone] 10/19/2023, 11:07 PM

## 2023-10-19 NOTE — Progress Notes (Signed)
 PT was transported to 2H-04 with no complications. RN and RT present.

## 2023-10-19 NOTE — ED Triage Notes (Signed)
 Patient BIB [facility] from home as post cardiac arrest. Wife went on a possibly 5 minutes walk and found patient prone laying on floor next to bed. Upon EMS arrival patient was asystole, 3 epis given. Patient went asystole to NS the lost pulses. CPR performed for 20 minutes per EMS. 4mcg epi gtt. Patient received 1L of fluids. Intubated with 7.5 ETT upon arrival. Pulses in tact.

## 2023-10-19 NOTE — Progress Notes (Signed)
 PT was transported to CT and back with no complications. RN and RT present.

## 2023-10-19 NOTE — ED Provider Notes (Signed)
 [place] EMERGENCY DEPARTMENT AT [facility] Provider Note   CSN: [id number] Arrival date & time: 10/19/23  2057     Patient presents with: Post CPR   Nathan Tyler is a 68 y.o. male.   HPI Patient brought in after cardiac arrest.  Patient's wife went on a walk with the dog.  Reportedly came back within 5 minutes and found him on the floor unresponsive.  Laying prone.  EMS arrived and found patient in asystole.  Had 3 epis with return of pulses then lost pulses.  Total about 20 minutes of CPR.  On epi drip.  Had some hypotension.  Patient has history of kidney transplant.  Reportedly good blood work yesterday.   Past Medical History:  Diagnosis Date   Acute metabolic encephalopathy 02/22/2022   Acute renal failure superimposed on stage 4 chronic kidney disease (HCC) 02/22/2022   Acute respiratory failure with hypoxia (HCC) 06/03/2019   Anemia    Takes iron shots once a month   Cancer (HCC)    eye - meleonoma   Chorea    COVID-19    2021   COVID-19 virus infection 05/31/2019   Diabetes mellitus without complication (HCC)    type 2   Dyspnea    Essential hypertension    Hepatitis    hep C   Hepatitis C antibody positive in blood 03/12/2017   Hypercholesteremia    Hypothyroidism    Neuromuscular disorder (HCC)    Pneumonia    PONV (postoperative nausea and vomiting)    Renal failure    Has had kidney transplant 2008   Sleep apnea    Mild. Does not require CPAP use    Prior to Admission medications   Medication Sig Start Date End Date Taking? Authorizing Provider  acetaminophen  (TYLENOL ) 325 MG tablet Take 650 mg by mouth. 08/08/23  Yes [provider]  aspirin EC 81 MG tablet Take 81 mg by mouth. 09/09/23 09/08/24 Yes [provider]  amLODipine  (NORVASC ) 10 MG tablet Take 10 mg by mouth in the morning. 12/28/21   [provider]  atorvastatin  (LIPITOR) 10 MG tablet Take 10 mg by mouth in the morning.    [provider]   carvedilol  (COREG ) 6.25 MG tablet Take 6.25 mg by mouth 2 (two) times daily with a meal.    [provider]  Deutetrabenazine  (AUSTEDO ) 6 MG TABS Take 6 mg by mouth 2 (two) times daily.    [provider]  furosemide  (LASIX ) 80 MG tablet Take 1 tablet (80 mg total) by mouth 2 (two) times daily. Patient taking differently: Take 40-80 mg by mouth See admin instructions. Take 80 mg in the morning and 40 mg at night 03/27/22 09/05/24  Kc, Mennie, MD  GVOKE HYPOPEN 2-PACK 1 MG/0.2ML SOAJ Inject 1 Dose as directed as needed (SUGARS SEVERELY LOW LESS THAN 55 NOT RESPONDING TO SUGAR INTAKE). 04/27/22   [provider]  hydrALAZINE  (APRESOLINE ) 50 MG tablet Take 50 mg by mouth 3 (three) times daily. 02/06/22   [provider]  insulin  aspart (NOVOLOG ) 100 UNIT/ML injection Inject into the skin continuous. Uses in omnipod insulin  pump    [provider]  Insulin  Disposable Pump (OMNIPOD DASH PODS, GEN 4,) MISC  02/19/22   [provider]  levothyroxine  (SYNTHROID ) 125 MCG tablet Take 125 mcg by mouth daily before breakfast. 02/20/22   [provider]  mycophenolate  (MYFORTIC ) 180 MG EC tablet Take 1 tablet (180 mg total) by mouth 2 (  two) times daily. 02/28/22   Odell Celinda Balo, MD  predniSONE  (DELTASONE ) 5 MG tablet Take 1 tablet (5 mg total) by mouth daily with breakfast. restart 5mg  dose 3/10 after completing prednisone  taper (2/24 - 3/9) 06/16/19   Bryn Bernardino NOVAK, MD  rifaximin  (XIFAXAN ) 550 MG TABS tablet Take 1 tablet (550 mg total) by mouth 3 (three) times daily. 03/12/23   Rourk, Lamar HERO, MD  tacrolimus  (PROGRAF ) 0.5 MG capsule Take 1.5 mg by mouth 2 (two) times daily.     [provider]    Allergies: Patient has no known allergies.    Review of Systems  Updated Vital Signs BP (!) 137/53   Pulse 71   Temp (!) 97.3 F (36.3 C) (Temporal)   Resp 15   Ht 5' 11 (1.803 m)   SpO2 100%   BMI 26.36 kg/m   Physical  Exam Vitals and nursing note reviewed.   Eyes:     Comments: No eye movements.  Pulmonary:     Breath sounds: No wheezing.  Abdominal:     Comments: 2 areas of ecchymosis anteriorly.  Potentially secondary to injections of insulin .   Musculoskeletal:     Comments: Some bruising to right lower leg.   Skin:    General: Skin is warm.     Capillary Refill: Capillary refill takes less than 2 seconds.   Neurological:     Comments: Unresponsive.  Not breathing above vent.    (all labs ordered are listed, but only abnormal results are displayed) Labs Reviewed  COMPREHENSIVE METABOLIC PANEL WITH GFR - Abnormal; Notable for the following components:      Result Value   Potassium 3.2 (*)    Glucose, Bld 288 (*)    Creatinine, Ser 1.68 (*)    Calcium  7.3 (*)    Total Protein 4.1 (*)    Albumin 1.7 (*)    AST 141 (*)    ALT 83 (*)    GFR, Estimated 43 (*)    All other components within normal limits  CBC WITH DIFFERENTIAL/PLATELET - Abnormal; Notable for the following components:   RBC 2.21 (*)    Hemoglobin 6.5 (*)    HCT 22.4 (*)    MCV 101.4 (*)    MCHC 29.0 (*)    RDW 16.0 (*)    Platelets 498 (*)    nRBC 0.5 (*)    All other components within normal limits  I-STAT CHEM 8, ED - Abnormal; Notable for the following components:   Potassium 3.1 (*)    Creatinine, Ser 1.60 (*)    Glucose, Bld 278 (*)    Calcium , Ion 1.06 (*)    Hemoglobin 6.5 (*)    HCT 19.0 (*)    All other components within normal limits  I-STAT CG4 LACTIC ACID, ED - Abnormal; Notable for the following components:   Lactic Acid, Venous 5.2 (*)    All other components within normal limits  TROPONIN I (HIGH SENSITIVITY) - Abnormal; Notable for the following components:   Troponin I (High Sensitivity) 46 (*)    All other components within normal limits  CULTURE, BLOOD (ROUTINE X 2)  CULTURE, BLOOD (ROUTINE X 2)  ETHANOL  URINALYSIS, W/ REFLEX TO CULTURE (INFECTION SUSPECTED)  TACROLIMUS  LEVEL  BLOOD  GAS, ARTERIAL  TRIGLYCERIDES  BLOOD GAS, ARTERIAL  CBC  CREATININE, SERUM  I-STAT CG4 LACTIC ACID, ED  TROPONIN I (HIGH SENSITIVITY)    EKG: EKG Interpretation Date/Time:  Saturday October 19 2023 21:11:05  EDT Ventricular Rate:  54 PR Interval:    QRS Duration:  187 QT Interval:  496 QTC Calculation: 471 R Axis:   -21  Text Interpretation: duplicate Confirmed by Patsey Lot [id number]) on 10/19/2023 9:28:21 PM  Radiology: CT HEAD WO CONTRAST ( ) Result Date: 10/19/2023 CLINICAL DATA:  Head trauma, minor (Age >= 65y); Neck trauma (Age >= 65y) EXAM: CT HEAD WITHOUT CONTRAST CT CERVICAL SPINE WITHOUT CONTRAST TECHNIQUE: Multidetector CT imaging of the head and cervical spine was performed following the standard protocol without intravenous contrast. Multiplanar CT image reconstructions of the cervical spine were also generated. RADIATION DOSE REDUCTION: This exam was performed according to the departmental dose-optimization program which includes automated exposure control, adjustment of the mA and/or kV according to patient size and/or use of iterative reconstruction technique. COMPARISON:  CT head and CT cervical spine 12/21/2021. FINDINGS: CT HEAD FINDINGS Brain: No evidence of acute infarction, hemorrhage, hydrocephalus, extra-axial collection or mass lesion/mass effect. Similar patchy white matter hypodensities, compatible with chronic microvascular ischemic disease. Vascular: No hyperdense vessel. Skull: No acute fracture. Sinuses/Orbits: Mostly clear sinuses.  No acute orbital findings. Other: No mastoid effusions. CT CERVICAL SPINE FINDINGS Alignment: No substantial sagittal subluxation. Skull base and vertebrae: Mild superior T2 endplate deformity, new since 12/21/2021 and compatible with otherwise age indeterminate superior endplate fracture. Soft tissues and spinal canal: No prevertebral fluid or swelling. No visible canal hematoma. Disc levels: Similar multilevel degenerative change  including moderate lower cervical degenerative disc disease and multilevel facet and uncovertebral hypertrophy with varying degrees of neural foraminal stenosis. Upper chest: Visualized lung apices are clear. IMPRESSION: 1. Mild superior T2 endplate deformity, new since 12/21/2021 and compatible with otherwise age indeterminate superior endplate fracture. Recommend correlation with the presence or absence of point tenderness. MRI of the thoracic spine could further evaluate chronicity if clinically warranted. 2. No evidence of acute intracranial abnormality. Electronically Signed   By: Gilmore GORMAN Molt M.D.   On: 10/19/2023 22:13   CT Cervical Spine Wo Contrast Result Date: 10/19/2023 CLINICAL DATA:  Head trauma, minor (Age >= 65y); Neck trauma (Age >= 65y) EXAM: CT HEAD WITHOUT CONTRAST CT CERVICAL SPINE WITHOUT CONTRAST TECHNIQUE: Multidetector CT imaging of the head and cervical spine was performed following the standard protocol without intravenous contrast. Multiplanar CT image reconstructions of the cervical spine were also generated. RADIATION DOSE REDUCTION: This exam was performed according to the departmental dose-optimization program which includes automated exposure control, adjustment of the mA and/or kV according to patient size and/or use of iterative reconstruction technique. COMPARISON:  CT head and CT cervical spine 12/21/2021. FINDINGS: CT HEAD FINDINGS Brain: No evidence of acute infarction, hemorrhage, hydrocephalus, extra-axial collection or mass lesion/mass effect. Similar patchy white matter hypodensities, compatible with chronic microvascular ischemic disease. Vascular: No hyperdense vessel. Skull: No acute fracture. Sinuses/Orbits: Mostly clear sinuses.  No acute orbital findings. Other: No mastoid effusions. CT CERVICAL SPINE FINDINGS Alignment: No substantial sagittal subluxation. Skull base and vertebrae: Mild superior T2 endplate deformity, new since 12/21/2021 and compatible with  otherwise age indeterminate superior endplate fracture. Soft tissues and spinal canal: No prevertebral fluid or swelling. No visible canal hematoma. Disc levels: Similar multilevel degenerative change including moderate lower cervical degenerative disc disease and multilevel facet and uncovertebral hypertrophy with varying degrees of neural foraminal stenosis. Upper chest: Visualized lung apices are clear. IMPRESSION: 1. Mild superior T2 endplate deformity, new since 12/21/2021 and compatible with otherwise age indeterminate superior endplate fracture. Recommend correlation with the presence or absence of point tenderness.  MRI of the thoracic spine could further evaluate chronicity if clinically warranted. 2. No evidence of acute intracranial abnormality. Electronically Signed   By: Gilmore GORMAN Molt M.D.   On: 10/19/2023 22:13   DG Chest Portable 1 View Result Date: 10/19/2023 CLINICAL DATA:  Post CPR for cardiac arrest EXAM: PORTABLE CHEST 1 VIEW COMPARISON:  Radiograph 09/18/2023 FINDINGS: Stable cardiomegaly. Diffuse interstitial coarsening and patchy airspace opacities. No pleural effusion or pneumothorax. Acute displaced right fifth rib fracture. Defibrillator pads. Endotracheal tube tip in the intrathoracic trachea 5.5 cm from the carina. IMPRESSION: 1. Endotracheal tube tip in the intrathoracic trachea 5.5 cm from the carina. 2. Diffuse interstitial coarsening and patchy airspace opacities may be due to edema or infection. 3. Acute displaced right fifth rib fracture. Electronically Signed   By: Norman Gatlin M.D.   On: 10/19/2023 21:44     Procedures   Medications Ordered in the ED  norepinephrine (LEVOPHED) 4mg  in (0.016 mg/mL) premix infusion (0 mcg/min Intravenous Paused 10/19/23 2215)  fentaNYL  (SUBLIMAZE ) injection 25 mcg (has no administration in time range)  fentaNYL  (SUBLIMAZE ) injection 25-100 mcg (50 mcg Intravenous Given 10/19/23 2231)  midazolam  (VERSED ) injection 1-2 mg (has  no administration in time range)  propofol  (DIPRIVAN ) 1000 MG/100ML infusion (has no administration in time range)  docusate (COLACE) 50 MG/5ML liquid 100 mg (has no administration in time range)  polyethylene glycol (MIRALAX  / GLYCOLAX ) packet 17 g (has no administration in time range)  ondansetron  (ZOFRAN ) injection 4 mg (has no administration in time range)  acetaminophen  (TYLENOL ) tablet 650 mg (has no administration in time range)    Or  acetaminophen  (TYLENOL ) 160 MG/5ML solution 650 mg (has no administration in time range)    Or  acetaminophen  (TYLENOL ) suppository 650 mg (has no administration in time range)  acetaminophen  (TYLENOL ) tablet 650 mg (has no administration in time range)    Or  acetaminophen  (TYLENOL ) 160 MG/5ML solution 650 mg (has no administration in time range)    Or  acetaminophen  (TYLENOL ) suppository 650 mg (has no administration in time range)  busPIRone (BUSPAR) tablet 30 mg (has no administration in time range)    Or  busPIRone (BUSPAR) tablet 30 mg (has no administration in time range)  magnesium sulfate IVPB 2 g 50 mL (has no administration in time range)  norepinephrine (LEVOPHED) 4mg  in (0.016 mg/mL) premix infusion (has no administration in time range)  0.9 %  sodium chloride  infusion (has no administration in time range)  famotidine (PEPCID) tablet 20 mg (has no administration in time range)  heparin  injection 5,000 Units (has no administration in time range)  Chlorhexidine  Gluconate Cloth 2 % PADS 6 each (has no administration in time range)  iohexol  (OMNIPAQUE ) 350 MG/ML injection 75 mL (75 mLs Intravenous Contrast Given 10/19/23 2158)                                    Medical Decision Making Amount and/or Complexity of Data Reviewed Labs: ordered. Radiology: ordered.  Risk Prescription drug management. Decision regarding hospitalization.   Patient presented post CPR with return of vitals.  Differential diagnose includes arrhythmia,  acute coronary syndrome, worsening renal function.  Also potentially traumatic injury.  Initially required low-dose of Levophed.  However able to titrate that off.  Has a worsening anemia.  However overall good kidney function.  CT scan of the head overall reassuring.  Chest abdomen pelvis also done  and no large amount of free fluid.  Discussed with ICU, who will admit patient.  Patient has now had some moving.  Almost posturing.  CRITICAL CARE Performed by: [place] Total critical care time: 30 minutes Critical care time was exclusive of separately billable procedures and treating other patients. Critical care was necessary to treat or prevent imminent or life-threatening deterioration. Critical care was time spent personally by me on the following activities: development of treatment plan with patient and/or surrogate as well as nursing, discussions with consultants, evaluation of patient's response to treatment, examination of patient, obtaining history from patient or surrogate, ordering and performing treatments and interventions, ordering and review of laboratory studies, ordering and review of radiographic studies, pulse oximetry and re-evaluation of patient's condition.      Final diagnoses:  Cardiac arrest [facility])  Renal transplant recipient    ED Discharge Orders     None          River Rankin, MD 10/19/23 2247

## 2023-10-19 NOTE — ED Notes (Signed)
Urinary catheter bag changed.

## 2023-10-20 ENCOUNTER — Inpatient Hospital Stay (HOSPITAL_COMMUNITY)

## 2023-10-20 ENCOUNTER — Encounter (HOSPITAL_COMMUNITY)

## 2023-10-20 DIAGNOSIS — E876 Hypokalemia: Secondary | ICD-10-CM

## 2023-10-20 DIAGNOSIS — Z940 Kidney transplant status: Secondary | ICD-10-CM

## 2023-10-20 DIAGNOSIS — G4089 Other seizures: Secondary | ICD-10-CM | POA: Diagnosis not present

## 2023-10-20 DIAGNOSIS — R001 Bradycardia, unspecified: Secondary | ICD-10-CM | POA: Diagnosis not present

## 2023-10-20 DIAGNOSIS — N1831 Chronic kidney disease, stage 3a: Secondary | ICD-10-CM

## 2023-10-20 DIAGNOSIS — E1165 Type 2 diabetes mellitus with hyperglycemia: Secondary | ICD-10-CM

## 2023-10-20 DIAGNOSIS — D849 Immunodeficiency, unspecified: Secondary | ICD-10-CM

## 2023-10-20 DIAGNOSIS — R569 Unspecified convulsions: Secondary | ICD-10-CM

## 2023-10-20 DIAGNOSIS — J9601 Acute respiratory failure with hypoxia: Secondary | ICD-10-CM | POA: Diagnosis not present

## 2023-10-20 DIAGNOSIS — J690 Pneumonitis due to inhalation of food and vomit: Secondary | ICD-10-CM | POA: Diagnosis not present

## 2023-10-20 DIAGNOSIS — E119 Type 2 diabetes mellitus without complications: Secondary | ICD-10-CM

## 2023-10-20 DIAGNOSIS — I5032 Chronic diastolic (congestive) heart failure: Secondary | ICD-10-CM

## 2023-10-20 DIAGNOSIS — I469 Cardiac arrest, cause unspecified: Secondary | ICD-10-CM | POA: Diagnosis not present

## 2023-10-20 DIAGNOSIS — D631 Anemia in chronic kidney disease: Secondary | ICD-10-CM

## 2023-10-20 LAB — MRSA NEXT GEN BY PCR, NASAL: MRSA by PCR Next Gen: NOT DETECTED

## 2023-10-20 LAB — GLUCOSE, CAPILLARY
Glucose-Capillary: 269 mg/dL — ABNORMAL HIGH (ref 70–99)
Glucose-Capillary: 108 mg/dL — ABNORMAL HIGH (ref 70–99)
Glucose-Capillary: 94 mg/dL (ref 70–99)

## 2023-10-20 LAB — BLOOD CULTURE ID PANEL (REFLEXED) - BCID2

## 2023-10-20 LAB — POCT I-STAT 7, (LYTES, BLD GAS, ICA,H+H)
Acid-base deficit: 1 mmol/L (ref 0.0–2.0)
Bicarbonate: 24.8 mmol/L (ref 20.0–28.0)
Calcium, Ion: 1.09 mmol/L — ABNORMAL LOW (ref 1.15–1.40)
HCT: 22 % — ABNORMAL LOW (ref 39.0–52.0)
Hemoglobin: 7.5 g/dL — ABNORMAL LOW (ref 13.0–17.0)
O2 Saturation: 99 %
Patient temperature: 35.5
Potassium: 2.9 mmol/L — ABNORMAL LOW (ref 3.5–5.1)
Sodium: 141 mmol/L (ref 135–145)
TCO2: 26 mmol/L (ref 22–32)
pCO2 arterial: 40.9 mmHg (ref 32–48)
pH, Arterial: 7.384 (ref 7.35–7.45)
pO2, Arterial: 154 mmHg — ABNORMAL HIGH (ref 83–108)

## 2023-10-20 LAB — BASIC METABOLIC PANEL WITH GFR
Anion gap: 10 (ref 5–15)
BUN: 29 mg/dL — ABNORMAL HIGH (ref 8–23)
CO2: 23 mmol/L (ref 22–32)
Calcium: 7.4 mg/dL — ABNORMAL LOW (ref 8.9–10.3)
Chloride: 107 mmol/L (ref 98–111)
Creatinine, Ser: 2.19 mg/dL — ABNORMAL HIGH (ref 0.61–1.24)
GFR, Estimated: 32 mL/min — ABNORMAL LOW (ref >60)
Glucose, Bld: 231 mg/dL — ABNORMAL HIGH (ref 70–99)
Potassium: 3.5 mmol/L (ref 3.5–5.1)
Sodium: 140 mmol/L (ref 135–145)

## 2023-10-20 LAB — CBC
HCT: 24.5 % — ABNORMAL LOW (ref 39.0–52.0)
Hemoglobin: 7.7 g/dL — ABNORMAL LOW (ref 13.0–17.0)
MCH: 30.6 pg (ref 26.0–34.0)
MCHC: 31.4 g/dL (ref 30.0–36.0)
MCV: 97.2 fL (ref 80.0–100.0)
Platelets: 514 10*3/uL — ABNORMAL HIGH (ref 150–400)
RBC: 2.52 MIL/uL — ABNORMAL LOW (ref 4.22–5.81)
RDW: 16.1 % — ABNORMAL HIGH (ref 11.5–15.5)
WBC: 5.1 10*3/uL (ref 4.0–10.5)
nRBC: 0 % (ref 0.0–0.2)

## 2023-10-20 LAB — CREATININE, SERUM
Creatinine, Ser: 1.59 mg/dL — ABNORMAL HIGH (ref 0.61–1.24)
GFR, Estimated: 46 mL/min — ABNORMAL LOW (ref >60)

## 2023-10-20 LAB — PHOSPHORUS: Phosphorus: 4 mg/dL (ref 2.5–4.6)

## 2023-10-20 LAB — MAGNESIUM: Magnesium: 1.9 mg/dL (ref 1.7–2.4)

## 2023-10-20 LAB — CG4 I-STAT (LACTIC ACID): Lactic Acid, Venous: 0.9 mmol/L (ref 0.5–1.9)

## 2023-10-20 LAB — HEMOGLOBIN A1C
Hgb A1c MFr Bld: 8.2 % — ABNORMAL HIGH (ref 4.8–5.6)
Mean Plasma Glucose: 188.64 mg/dL

## 2023-10-20 LAB — TROPONIN I (HIGH SENSITIVITY): Troponin I (High Sensitivity): 172 ng/L (ref <18)

## 2023-10-20 LAB — TRIGLYCERIDES: Triglycerides: 886 mg/dL — ABNORMAL HIGH (ref <150)

## 2023-10-20 LAB — PREPARE RBC (CROSSMATCH)

## 2023-10-20 LAB — BILIRUBIN, FRACTIONATED(TOT/DIR/INDIR)
Bilirubin, Direct: <0.1 mg/dL (ref 0.0–0.2)
Total Bilirubin: 0.8 mg/dL (ref 0.0–1.2)

## 2023-10-20 LAB — LACTATE DEHYDROGENASE: LDH: 553 U/L — ABNORMAL HIGH (ref 98–192)

## 2023-10-20 MED ORDER — LEVOTHYROXINE SODIUM 75 MCG PO TABS
150.0000 ug | ORAL_TABLET | Freq: Every day | ORAL | Status: DC
  Administered 2023-10-20 – 2023-10-21 (×2): 150 ug
  Filled 2023-10-20 (×2): qty 2

## 2023-10-20 MED ORDER — LEVETIRACETAM (KEPPRA) 500 MG/5 ML ADULT IV PUSH
1000.0000 mg | Freq: Two times a day (BID) | INTRAVENOUS | Status: DC
  Administered 2023-10-20 – 2023-10-22 (×4): 1000 mg via INTRAVENOUS
  Filled 2023-10-20 (×4): qty 10

## 2023-10-20 MED ORDER — INSULIN GLARGINE-YFGN 100 UNIT/ML ~~LOC~~ SOLN
5.0000 [IU] | Freq: Two times a day (BID) | SUBCUTANEOUS | Status: DC
  Administered 2023-10-20 – 2023-10-21 (×2): 5 [IU] via SUBCUTANEOUS
  Filled 2023-10-20 (×4): qty 0.05

## 2023-10-20 MED ORDER — INSULIN ASPART 100 UNIT/ML IJ SOLN
0.0000 [IU] | INTRAMUSCULAR | Status: DC
  Administered 2023-10-20: 2 [IU] via SUBCUTANEOUS
  Administered 2023-10-20: 3 [IU] via SUBCUTANEOUS

## 2023-10-20 MED ORDER — POTASSIUM CHLORIDE 10 MEQ/100ML IV SOLN
10.0000 meq | INTRAVENOUS | Status: DC
  Administered 2023-10-20 (×3): 10 meq via INTRAVENOUS
  Filled 2023-10-20 (×3): qty 100

## 2023-10-20 MED ORDER — TACROLIMUS 1 MG/ML ORAL SUSPENSION
1.0000 mg | Freq: Two times a day (BID) | ORAL | Status: DC
  Administered 2023-10-20 – 2023-10-21 (×4): 1 mg
  Filled 2023-10-20 (×7): qty 1

## 2023-10-20 MED ORDER — LORAZEPAM 2 MG/ML IJ SOLN
2.0000 mg | INTRAMUSCULAR | Status: DC | PRN
  Administered 2023-10-20: 2 mg via INTRAVENOUS
  Filled 2023-10-20: qty 1

## 2023-10-20 MED ORDER — INSULIN ASPART 100 UNIT/ML IJ SOLN
0.0000 [IU] | Freq: Three times a day (TID) | INTRAMUSCULAR | Status: DC
  Administered 2023-10-20: 5 [IU] via SUBCUTANEOUS
  Administered 2023-10-20: 11 [IU] via SUBCUTANEOUS
  Administered 2023-10-21: 2 [IU] via SUBCUTANEOUS

## 2023-10-20 MED ORDER — POTASSIUM CHLORIDE 10 MEQ/100ML IV SOLN
10.0000 meq | Freq: Once | INTRAVENOUS | Status: AC
  Administered 2023-10-20: 10 meq via INTRAVENOUS
  Filled 2023-10-20: qty 100

## 2023-10-20 MED ORDER — SODIUM CHLORIDE 0.9 % IV SOLN
2.0000 g | INTRAVENOUS | Status: DC
  Administered 2023-10-20 – 2023-10-21 (×2): 2 g via INTRAVENOUS
  Filled 2023-10-20 (×2): qty 20

## 2023-10-20 MED ORDER — INSULIN ASPART 100 UNIT/ML IJ SOLN: 1.0000 [IU] | INTRAMUSCULAR | Status: DC

## 2023-10-20 MED ORDER — INSULIN ASPART 100 UNIT/ML IJ SOLN: 0.0000 [IU] | Freq: Every day | INTRAMUSCULAR | Status: DC

## 2023-10-20 MED ORDER — MIDAZOLAM-SODIUM CHLORIDE 100-0.9 MG/100ML-% IV SOLN
10.0000 mg/h | INTRAVENOUS | Status: DC
  Administered 2023-10-20: 10 mg/h via INTRAVENOUS
  Filled 2023-10-20: qty 100

## 2023-10-20 MED ORDER — PREDNISONE 5 MG PO TABS
5.0000 mg | ORAL_TABLET | Freq: Every day | ORAL | Status: DC
  Administered 2023-10-20 – 2023-10-21 (×2): 5 mg
  Filled 2023-10-20 (×3): qty 1

## 2023-10-20 MED ORDER — SODIUM CHLORIDE 0.9 % IV SOLN
3.0000 g | Freq: Four times a day (QID) | INTRAVENOUS | Status: DC
  Administered 2023-10-20: 3 g via INTRAVENOUS
  Filled 2023-10-20: qty 8

## 2023-10-20 MED ORDER — POTASSIUM CHLORIDE 20 MEQ PO PACK
40.0000 meq | PACK | ORAL | Status: DC
  Administered 2023-10-20: 40 meq
  Filled 2023-10-20: qty 2

## 2023-10-20 MED ORDER — FUROSEMIDE 10 MG/ML IJ SOLN
60.0000 mg | Freq: Once | INTRAMUSCULAR | Status: AC
  Administered 2023-10-20: 60 mg via INTRAVENOUS
  Filled 2023-10-20: qty 6

## 2023-10-20 MED ORDER — MYCOPHENOLATE 200 MG/ML ORAL SUSPENSION
750.0000 mg | Freq: Two times a day (BID) | ORAL | Status: DC
  Administered 2023-10-20 – 2023-10-21 (×3): 750 mg
  Filled 2023-10-20 (×6): qty 3.75

## 2023-10-20 MED ORDER — LEVETIRACETAM (KEPPRA) 500 MG/5 ML ADULT IV PUSH
4000.0000 mg | Freq: Once | INTRAVENOUS | Status: AC
  Administered 2023-10-20: 4000 mg via INTRAVENOUS
  Filled 2023-10-20: qty 40

## 2023-10-20 MED ORDER — MYCOPHENOLATE 200 MG/ML ORAL SUSPENSION
500.0000 mg | ORAL | Status: DC
  Administered 2023-10-20 – 2023-10-21 (×2): 500 mg
  Filled 2023-10-20 (×3): qty 2.5

## 2023-10-20 MED ORDER — MIDAZOLAM BOLUS VIA INFUSION: 10.0000 mg | INTRAVENOUS | Status: DC | PRN

## 2023-10-20 MED ORDER — ENOXAPARIN SODIUM 40 MG/0.4ML IJ SOSY
40.0000 mg | PREFILLED_SYRINGE | INTRAMUSCULAR | Status: DC
  Administered 2023-10-20: 40 mg via SUBCUTANEOUS
  Filled 2023-10-20: qty 0.4

## 2023-10-20 NOTE — Progress Notes (Signed)
 eLink Physician-Brief Progress Note Patient Name: Nathan Tyler DOB: 13-Apr-1956 MRN: [id number]   Date of Service  10/20/2023  HPI/Events of Note  68 yr old male with ESRD s/p kidney transplant in April 2025 s/p OOHA.  Wife found shortly after arrest and performed.  After EMA arrive reported 20 min before ROSC.  K 3.1 and hgb 6.5 on presentation.  Cause of arrest unclear.  Evaluation ongoing.  Intubated and presently not requiring pressors.  eICU Interventions  Chart reviewed     Intervention Category Evaluation Type: New Patient Evaluation  CLAUDENE AGENT, P 10/20/2023, 12:13 AM

## 2023-10-20 NOTE — Plan of Care (Signed)
  Problem: Cardiac: Goal: Ability to achieve and maintain adequate cardiopulmonary perfusion will improve Outcome: Progressing   Problem: Skin Integrity: Goal: Risk for impaired skin integrity will be minimized. Outcome: Progressing   Problem: Clinical Measurements: Goal: Ability to maintain clinical measurements within normal limits will improve Outcome: Progressing Goal: Will remain free from infection Outcome: Progressing Goal: Cardiovascular complication will be avoided Outcome: Progressing   Problem: Pain Managment: Goal: General experience of comfort will improve and/or be controlled Outcome: Progressing   Problem: Safety: Goal: Ability to remain free from injury will improve Outcome: Progressing   Problem: Skin Integrity: Goal: Risk for impaired skin integrity will decrease Outcome: Progressing   Problem: Tissue Perfusion: Goal: Adequacy of tissue perfusion will improve Outcome: Progressing

## 2023-10-20 NOTE — Progress Notes (Signed)
 LTM EEG hooked up and running - no initial skin breakdown - push button tested - [facility] monitoring.

## 2023-10-20 NOTE — Progress Notes (Signed)
 NAME:  Nathan Tyler, MRN:  [id number], DOB:  03-30-1956, LOS: 1 ADMISSION DATE:  10/19/2023, CONSULTATION DATE:  10/19/2023 REFERRING MD: [place], MD, CHIEF COMPLAINT:  Cardiac Arrest  History of Present Illness:  68 y/o male who has a PMH for HTN, Chorea movement disorder, Hypothyroidism, and ESRD 2/2 diabetic nephropathy s/p LDKT (2010, from sister, at [facility], no rejection hx), now on iHD via AVF since 06/2023 who BIBEMS after cardiac arrest.  The patient previously had a kidney transplant for about 13 years placed at [facility]  and this stopped working.  He was placed on transplant list at [facility] 2 years ago and  was on Dialysis x 1 month through right arm fistula when a matched kidney was found and in April he underwent another Kidney transplant at [facility].  He had urinary retention since the transplant and has had a Foley cath since.  He has been working with Urology on this.  He was seen at [facility] yesterday and apparently everything was fine.  His wife went out to walk the dog and says she was out for about 5 min and came back to find him on the floor.  He was pulseless, she called 911 who recommended she start CPR which she did.  She says EMS arrived within 5 min and continued with CPR.  ROSC achieved in about 20 min total and prior to ED arrival.  He was intubated.  He received 3 epinephrines and then was started on Epinephrine  drip which was weaned off in ED. He has been having diarrhea recently.  In ED his K+ was 3.1, Cr 1.60, Albumin 1.7, LA 5.2, Hgb 6.5.  Patient vented in ED and having jerking movements. Pertinent  Medical History  HTN, hypothyroidism, and ESRD 2/2 diabetic nephropathy s/p LDKT (2010, from sister[facility] at [facility], no rejection hx), now on iHD via AVF since 06/2023   Significant Hospital Events: Including procedures, antibiotic start and stop dates in addition to other pertinent events   6/28: Admit to ICU  Interim History / Subjective:  EEG confirmed myoclonus with diffuse  encephalopathy, consistent with hypoxic/anoxic brain injury Remained hypothermic and bradycardic with heart rate in 40s Versed  was stopped due to bradycardia Off vasopressor support  Objective    Blood pressure (!) 103/57, pulse (!) 51, temperature (!) 95 F (35 C), resp. rate 12, height 5' 11 (1.803 m), weight 93.5 kg, SpO2 100%.    Vent Mode: PRVC FiO2 (%):  [40 %-100 %] 40 % Set Rate:  [15 bmp] 15 bmp Vt Set:  [500 mL-600 mL] 600 mL PEEP:  [5 cmH20] 5 cmH20 Plateau Pressure:  [15 cmH20-18 cmH20] 15 cmH20   Intake/Output Summary (Last 24 hours) at 10/20/2023 0819 Last data filed at 10/19/2023 2300 Gross per 24 hour  Intake --  Output 0 ml  Net 0 ml   Filed Weights   10/19/23 2324 10/20/23 0500  Weight: 93.5 kg 93.5 kg    Examination: General: Crtitically ill-appearing elderly male, orally intubated HEENT: [place]/AT, eyes anicteric.  ETT and cortrak in place Neuro: Closed, does not open, not following commands, pupils 2 mm and minimally reactive to light, extensor posturing of all extremities with painful stimuli, stimulus induced myoclonus noted.  Weak cough present Chest: Fine crackles at the bases bilaterally, no wheezes or rhonchi Heart: Bradycardic, regular rhythm, no murmurs or gallops Abdomen: Soft, nondistended, bowel sounds present  Labs and images reviewed  Patient Lines/Drains/Airways Status     Active Line/Drains/Airways  Name Placement date Placement time Site Days   Peripheral IV 10/19/23 20 G Left Antecubital 10/19/23  --  Antecubital  1   Peripheral IV 10/19/23 18 G Left;Posterior Forearm 10/19/23  2116  Forearm  1   Fistula / Graft Right Upper arm Arteriovenous fistula 06/27/22  --  Upper arm  480   NG/OG Vented/Dual Lumen Oral Marking at nare/corner of mouth 10/20/23  0000  Oral  less than 1   Urethral Catheter LATASHIA,RN Temperature probe 16 Fr. 10/19/23  0400  Temperature probe  1   Airway 7.5 mm 10/19/23  --  -- 1   Wound / Incision (Open or  Dehisced) 03/28/23 Other (Comment) Chest Bilateral healing shingles 03/28/23  1717  Chest  206   Wound / Incision (Open or Dehisced) 03/28/23 Other (Comment) Vertebral column Right;Upper;Bilateral healing shingles 03/28/23  1717  Vertebral column  206   Wound 10/19/23 2300 Traumatic Arm Anterior;Lower;Proximal;Right 10/19/23  2300  Arm  1   Wound 10/19/23 2300 Traumatic Arm Anterior;Left;Lower;Proximal 10/19/23  2300  Arm  1   Wound 10/19/23 2300 Traumatic Arm Lower;Posterior;Proximal;Right 10/19/23  2300  Arm  1   Wound 10/19/23 2300 Traumatic Coccyx Mid 10/19/23  2300  Coccyx  1        Resolved problem list  Lactic acidosis  Assessment and Plan  Status post out-of-hospital PEA cardiac arrest, unclear etiology Postarrest myoclonus Acute respiratory failure with hypoxia Bilateral lower lobe aspiration pneumonia CKD stage IIIa status post renal transplant x 2, last 1 in April 2025 On immunosuppressive therapy Renal mass and atrophic kidney Hypokalemia Chronic HFpEF Demand cardiac ischemia Multiple bilateral rib fractures post CPR Anemia of chronic disease Shock liver postarrest Poorly controlled diabetes with hyperglycemia Hypothyroidism  Continue supportive care He is hypothermic, will maintain that, if he becomes febrile will start cold packs He started having myoclonus, which is confirmed with the EEG Received Keppra load on Keppra currently Versed  infusion was stopped due to bradycardia Continue on protective ventilation VAP prevention bundle in place PAD protocol, currently off sedation CT chest is consistent with bilateral lower lobe aspiration pneumonia Will get respiratory culture Started on IV Unasyn Patient serum creatinine is at baseline posttransplant in April 2025 Resume immunosuppressive therapy CT abdomen pelvis and chest consistent with 2.2 cm renal mass and atrophic kidney, needs further workup when stable Echocardiogram in 2023 showing grade 2 diastolic  dysfunction with normal EF Will repeat echocardiogram Serum troponin remain flat Continue as needed pain meds for bilateral rib fractures H&H is stable, closely monitor and transfuse if less than 7 Repeat LFTs in the morning Patient's hemoglobin A1c is 8.2 Started on sliding scale insulin  with Lantus  5 units twice daily  Monitor CBG with goal 140-180 Restart levothyroxine   Best Practice (right click and Reselect all SmartList Selections daily)   Diet/type: NPO DVT prophylaxis subcu Lovenox  Pressure ulcer(s): N/A GI prophylaxis: H2B Lines: N/A Foley:  Yes, and it is still needed Code Status:  full code   Labs   CBC: Recent Labs  Lab 10/19/23 2122 10/19/23 2124 10/19/23 2353 10/20/23 0212  WBC  --  4.4 5.1  --   NEUTROABS  --  2.7  --   --   HGB 6.5* 6.5* 7.7* 7.5*  HCT 19.0* 22.4* 24.5* 22.0*  MCV  --  101.4* 97.2  --   PLT  --  498* 514*  --     Basic Metabolic Panel: Recent Labs  Lab 10/19/23 2122 10/19/23 2124 10/19/23 2353 10/20/23 9787  NA 141 142  --  141  K 3.1* 3.2*  --  2.9*  CL 103 106  --   --   CO2  --  22  --   --   GLUCOSE 278* 288*  --   --   BUN 20 19  --   --   CREATININE 1.60* 1.68* 1.59*  --   CALCIUM   --  7.3*  --   --   MG  --   --  1.9  --   PHOS  --   --  4.0  --    GFR: Estimated Creatinine Clearance: 50.5 mL/min (A) (by C-G formula based on SCr of 1.59 mg/dL (H)). Recent Labs  Lab 10/19/23 2124 10/19/23 2126 10/19/23 2353 10/20/23 0207  WBC 4.4  --  5.1  --   LATICACIDVEN  --  5.2*  --  0.9    Liver Function Tests: Recent Labs  Lab 10/19/23 2124 10/19/23 2353  AST 141*  --   ALT 83*  --   ALKPHOS 89  --   BILITOT <0.2 0.8  PROT 4.1*  --   ALBUMIN 1.7*  --    No results for input(s): LIPASE, AMYLASE in the last 168 hours. No results for input(s): AMMONIA in the last 168 hours.  ABG    Component Value Date/Time   PHART 7.384 10/20/2023 0212   PCO2ART 40.9 10/20/2023 0212   PO2ART 154 (H) 10/20/2023  0212   HCO3 24.8 10/20/2023 0212   TCO2 26 10/20/2023 0212   ACIDBASEDEF 1.0 10/20/2023 0212   O2SAT 99 10/20/2023 0212     Coagulation Profile: No results for input(s): INR, PROTIME in the last 168 hours.  Cardiac Enzymes: No results for input(s): CKTOTAL, CKMB, CKMBINDEX, TROPONINI in the last 168 hours.  HbA1C: Hgb A1c MFr Bld  Date/Time Value Ref Range Status  10/20/2023 03:24 AM 8.2 (H) 4.8 - 5.6 % Final    Comment:    (NOTE) Diagnosis of Diabetes The following HbA1c ranges recommended by the American Diabetes Association (ADA) may be used as an aid in the diagnosis of diabetes mellitus.  Hemoglobin             Suggested A1C NGSP%              Diagnosis  <5.7                   Non Diabetic  5.7-6.4                Pre-Diabetic  >6.4                   Diabetic  <7.0                   Glycemic control for                       adults with diabetes.    02/22/2022 06:23 PM 5.7 (H) 4.8 - 5.6 % Final    Comment:    (NOTE) Pre diabetes:          5.7%-6.4%  Diabetes:              >6.4%  Glycemic control for   <7.0% adults with diabetes     CBG: Recent Labs  Lab 10/19/23 2316 10/20/23 0153  GLUCAP 272* 269*    The patient is critically ill due to status post out-of-hospital cardiac arrest/acute respiratory failure with hypoxia/aspiration  pneumonia.  Critical care was necessary to treat or prevent imminent or life-threatening deterioration.  Critical care was time spent personally by me on the following activities: development of treatment plan with patient and/or surrogate as well as nursing, discussions with consultants, evaluation of patient's response to treatment, examination of patient, obtaining history from patient or surrogate, ordering and performing treatments and interventions, ordering and review of laboratory studies, ordering and review of radiographic studies, pulse oximetry, re-evaluation of patient's condition and participation in  multidisciplinary rounds.   During this encounter critical care time was devoted to patient care services described in this note for 39 minutes.     Valinda Novas, MD [facility] See Amion for pager If no response to pager, please call [phone] until 7pm After 7pm, Please call E-link [phone]

## 2023-10-20 NOTE — Consult Note (Signed)
 NEUROLOGY CONSULT NOTE   Date of service: October 20, 2023 Patient Name: Nathan Tyler MRN:  [id number] DOB:  1955-08-04 Chief Complaint: cardiac arrest Requesting Provider: Maree Harder, MD  History of Present Illness  Nathan Tyler is a 68 y.o. male with hx of CKD s/p renal transplant x 2 (2010, 08/07/2023, on MMF, prednisone , tacrolimus ), type 2 diabetes, hepatitis C, chorea,  His wife had stepped out to walk the dog today and he seemed in his usual state of health without any recent complaints.  When she came back in the house less than 5 minutes later he was found down.  She called 911 and was instructed to start CPR.  EMS arrived within 5 minutes.  Total estimated time to ROSC was 20 minutes.  Subsequently developed myoclonic jerking movements for which EEG was ordered.  On my preliminary review concerning for myoclonic seizure  ROS  Unable to assess secondary to patient's mental status   Past History   Past Medical History:  Diagnosis Date   Acute metabolic encephalopathy 02/22/2022   Acute renal failure superimposed on stage 4 chronic kidney disease (HCC) 02/22/2022   Acute respiratory failure with hypoxia (HCC) 06/03/2019   Anemia    Takes iron shots once a month   Cancer (HCC)    eye - meleonoma   Chorea    COVID-19    2021   COVID-19 virus infection 05/31/2019   Diabetes mellitus without complication (HCC)    type 2   Dyspnea    Essential hypertension    Hepatitis    hep C   Hepatitis C antibody positive in blood 03/12/2017   Hypercholesteremia    Hypothyroidism    Neuromuscular disorder (HCC)    Pneumonia    PONV (postoperative nausea and vomiting)    Renal failure    Has had kidney transplant 2008   Sleep apnea    Mild. Does not require CPAP use    Past Surgical History:  Procedure Laterality Date   AMPUTATION Right 06/27/2016   Procedure: AMPUTATION 5TH TOE RIGHT FOOT;  Surgeon: Morene Anon, DPM;  Location: [facility];  Service: Podiatry;  Laterality:  Right;   AMPUTATION TOE Left 09/02/2015   Procedure: AMPUTATION  2ND TOE LEFT FOOT;  Surgeon: Oneil Budge, MD;  Location: [facility];  Service: General;  Laterality: Left;   AV FISTULA PLACEMENT Right 06/27/2022   Procedure: RIGHT BRACHIOCEPHALIC ARTERIOVENOUS (AV) FISTULA CREATION;  Surgeon: Magda Debby SAILOR, MD;  Location: [facility] OR;  Service: Vascular;  Laterality: Right;   BASCILIC VEIN TRANSPOSITION Right 09/10/2022   Procedure: RIGHT UPPER ARM SECOND STAGE BRACHIAL CEPHALIC VEIN TRANSPOSITION;  Surgeon: Magda Debby SAILOR, MD;  Location: [facility] OR;  Service: Vascular;  Laterality: Right;   BIOPSY  11/24/2021   Procedure: BIOPSY;  Surgeon: Shaaron Lamar HERO, MD;  Location: [facility];  Service: Endoscopy;;   CATARACT EXTRACTION Bilateral    COLONOSCOPY  01/2014   done in [place] . patient reports h/o colon polyps on TCS in 2010, 2015 colonoscopy normal but advised to come back in 2020.    COLONOSCOPY WITH PROPOFOL  N/A 01/19/2019   Procedure: COLONOSCOPY WITH PROPOFOL ;  Surgeon: Shaaron Lamar HERO, MD;  Location: [facility];  Service: Endoscopy;  Laterality: N/A;  9:45am   COLONOSCOPY WITH PROPOFOL  N/A 11/24/2021   pancolonic diverticulosis, three polyps, adenomas, negative microscopic colitis   IR FLUORO GUIDE CV LINE RIGHT  03/14/2022   IR PERC TUN PERIT CATH WO PORT S&I SHERRILL  03/14/2022  IR REMOVAL TUN CV CATH W/O FL  03/19/2022   IR US  GUIDE VASC ACCESS RIGHT  03/14/2022   KIDNEY TRANSPLANT Right 2010   POLYPECTOMY  01/19/2019   Procedure: POLYPECTOMY;  Surgeon: Shaaron Lamar HERO, MD;  [facility]: [facility];  Service: Endoscopy;;   POLYPECTOMY  11/24/2021   Procedure: POLYPECTOMY;  Surgeon: Shaaron Lamar HERO, MD;  [facility]: [facility];  Service: Endoscopy;;    Family History: Family History  Problem Relation Age of Onset   Colon cancer Maternal Aunt 75       [facility]    Social History  reports that he has never smoked. He has never been exposed to tobacco smoke. He has never used  smokeless tobacco. He reports that he does not currently use alcohol. He reports that he does not currently use drugs after having used the following drugs: Crack cocaine.  No Known Allergies  Medications   Current Facility-Administered Medications:    0.9 %  sodium chloride  infusion (Manually program via Guardrails IV Fluids), , Intravenous, Once, Autry, Lauren E, PA-C   0.9 %  sodium chloride  infusion, 250 mL, Intravenous, Continuous, Autry, Lauren E, PA-C   acetaminophen  (TYLENOL ) tablet 650 mg, 650 mg, Oral, Q4H, 650 mg at 10/20/23 0352 **OR** acetaminophen  (TYLENOL ) 160 MG/5ML solution 650 mg, 650 mg, Per Tube, Q4H **OR** acetaminophen  (TYLENOL ) suppository 650 mg, 650 mg, Rectal, Q4H, Autry, Lauren E, PA-C   [START ON 10/21/2023] acetaminophen  (TYLENOL ) tablet 650 mg, 650 mg, Oral, Q4H PRN **OR** [START ON 10/21/2023] acetaminophen  (TYLENOL ) 160 MG/5ML solution 650 mg, 650 mg, Per Tube, Q4H PRN **OR** [START ON 10/21/2023] acetaminophen  (TYLENOL ) suppository 650 mg, 650 mg, Rectal, Q4H PRN, Adolph, Lauren E, PA-C   busPIRone (BUSPAR) tablet 30 mg, 30 mg, Oral, Q8H PRN **OR** busPIRone (BUSPAR) tablet 30 mg, 30 mg, Per Tube, Q8H PRN, Adolph, Lauren E, PA-C   Chlorhexidine  Gluconate Cloth 2 % PADS 6 each, 6 each, Topical, Daily, Autry, Lauren E, PA-C   docusate (COLACE) 50 MG/5ML liquid 100 mg, 100 mg, Per Tube, BID, Adolph, Lauren E, PA-C   famotidine (PEPCID) tablet 20 mg, 20 mg, Per Tube, BID, Adolph, Lauren E, PA-C   fentaNYL  (SUBLIMAZE ) injection 25 mcg, 25 mcg, Intravenous, Q15 min PRN, Patsey Lot, MD   fentaNYL  (SUBLIMAZE ) injection 25-100 mcg, 25-100 mcg, Intravenous, Q30 min PRN, Patsey Lot, MD, 50 mcg at 10/19/23 2231   heparin  injection 5,000 Units, 5,000 Units, Subcutaneous, Q8H, Autry, Lauren E, PA-C   insulin  aspart (novoLOG ) injection 0-6 Units, 0-6 Units, Subcutaneous, Q4H, Claudene Agent, MD, 3 Units at 10/20/23 0221   LORazepam (ATIVAN) injection 2 mg, 2 mg,  Intravenous, Q2H PRN, Claudene Agent, MD, 2 mg at 10/20/23 0146   magnesium sulfate IVPB 2 g 50 mL, 2 g, Intravenous, Once PRN, Adolph, Lauren E, PA-C   midazolam  (VERSED ) injection 1-2 mg, 1-2 mg, Intravenous, Q1H PRN, Patsey Lot, MD, 1 mg at 10/19/23 2301   norepinephrine (LEVOPHED) 4mg  in (0.016 mg/mL) premix infusion, 2-10 mcg/min, Intravenous, Titrated, Autry, Lauren E, PA-C   ondansetron  (ZOFRAN ) injection 4 mg, 4 mg, Intravenous, Q6H PRN, Autry, Lauren E, PA-C   Oral care mouth rinse, 15 mL, Mouth Rinse, Q2H, Maree Harder, MD, 15 mL at 10/20/23 0000   Oral care mouth rinse, 15 mL, Mouth Rinse, PRN, Maree Harder, MD   polyethylene glycol (MIRALAX  / GLYCOLAX ) packet 17 g, 17 g, Per Tube, Daily, Autry, Lauren E, PA-C   potassium chloride (KLOR-CON) packet 40 mEq,  40 mEq, Per Tube, Q4H, Claudene Agent, MD, 40 mEq at 10/20/23 0352   propofol  (DIPRIVAN ) 1000 MG/100ML infusion, 0-50 mcg/kg/min, Intravenous, Continuous, Adolph Tinnie BRAVO, PA-C, Last Rate: 11.22 mL/hr at 2023/11/16 2315, 20 mcg/kg/min at 11/16/2023 2315  Vitals   Vitals:   10/20/23 0400 10/20/23 0415 10/20/23 0430 10/20/23 0445  BP: (!) 119/59 (!) 122/58 (!) 118/57 115/79  Pulse: (!) 52 (!) 53 (!) 52 76  Resp: 20 14 12 12   Temp: (!) 93.7 F (34.3 C) (!) 95 F (35 C) (!) 95.2 F (35.1 C) (!) 95.4 F (35.2 C)  TempSrc:      SpO2: 99% 99% 99% 100%  Weight:      Height:        Body mass index is 28.75 kg/m.   Physical Exam   Physical Exam  Constitutional: Appears ill Psych: not interactive Eyes: Scleral edema is absent  HENT: ET tube in place MSK: no major joint deformities.  Cardiovascular: Normal rate and regular rhythm.  Respiratory: Breathing comfortably on the ventilator GI: Soft.  No distension. There is no tenderness.   Intermittent florid myoclonic jerking episodes involving the full body with some eye-opening, correlating with EEG activity concerning for myoclonic seizure  Neuro: Mental  Status: Does not open eyes spontaneously, to voice or noxious stimulation, outside of when he is having myoclonic jerking episodes Does not follow any commands Cranial Nerves: II: No blink to threat. Pupils are equal, round, and reactive to light.  2 mm to 1 mm bilaterally III,IV, VI/VIII: VOR not assessed given LTM hookup in process V/VII: Facial sensation is symmetric to corneal stimulation with gauze VIII: No response to voice X/XI: Nursing reports no cough to deep suction XII: Unable to assess tongue protrusion secondary to patient's mental status  Motor/Sensory: Bilateral extensor posturing in the upper extremities.  Lower extremities had myoclonic jerking in response to proximal thigh noxious stimulation   Labs/Imaging/Neurodiagnostic studies   CBC:  Recent Labs  Lab Nov 16, 2023 2124 Nov 16, 2023 2353 10/20/23 0212  WBC 4.4 5.1  --   NEUTROABS 2.7  --   --   HGB 6.5* 7.7* 7.5*  HCT 22.4* 24.5* 22.0*  MCV 101.4* 97.2  --   PLT 498* 514*  --    Basic Metabolic Panel:  Lab Results  Component Value Date   NA 141 10/20/2023   K 2.9 (L) 10/20/2023   CO2 22 11/16/23   GLUCOSE 288 (H) Nov 16, 2023   BUN 19 2023/11/16   CREATININE 1.59 (H) November 16, 2023   CALCIUM  7.3 (L) 11/16/23   GFRNONAA 46 (L) 2023/11/16   GFRAA 53 (L) 06/16/2019   Lipid Panel: No results found for: LDLCALC HgbA1c:  Lab Results  Component Value Date   HGBA1C 8.2 (H) 10/20/2023   Urine Drug Screen: No results found for: LABOPIA, COCAINSCRNUR, LABBENZ, AMPHETMU, THCU, LABBARB  Alcohol Level     Component Value Date/Time   [facility] <15 11-16-23 2124   INR  Lab Results  Component Value Date   INR 1.1 02/22/2022   APTT  Lab Results  Component Value Date   APTT 38 (H) 03/14/2022     CT Head without contrast(Personally reviewed) and C-spine  1. Mild superior T2 endplate deformity, new since 12/21/2021 and compatible with otherwise age indeterminate superior endplate fracture.  Recommend correlation with the presence or absence of point tenderness. MRI of the thoracic spine could further evaluate chronicity if clinically warranted. 2. No evidence of acute intracranial abnormality.   Neurodiagnostics rEEG:  Formal report  pending on my review consistent with myoclonic seizures  ASSESSMENT   RONDARIUS KADRMAS is a 68 y.o. male whose examination and clinical history are highly concerning for developing anoxic brain injury.    Exam at this time does show preservation of brainstem reflexes but decerebrate posturing in the upper extremities and lower extremity responses difficult to assess secondary to myoclonus  Discussed with wife that formal prognostication is typically not for 72 hours postarrest, but also disclosed that I am worried that he is likely to have significant anoxic brain injury  Impression: - Cardiac arrest - Anemia to 6.5 s/p blood transfusion - Rib fractures secondary to CPR - Transaminitis - Immunosuppressed secondary to renal transplants - Diabetes - Bradycardia precluding propofol   RECOMMENDATIONS  - Start Versed , give 10 mg bolus followed by drip at rate of 10 mg/h initially, will titrate as needed for seizure control - Keppra 4000 mg load, then 1000 mg BID. Adjust as needed for renal function:  Estimated Creatinine Clearance: 50.5 mL/min (A) (by C-G formula based on SCr of 1.59 mg/dL (H)).   CrCl 80 to 130 mL/minute/1.73 m2: 500 mg to 1.5 g every 12 hours.  CrCl 50 to <80 mL/minute/1.73 m2: 500 mg to 1 g every 12 hours.  CrCl 30 to <50 mL/minute/1.73 m2: 250 to 750 mg every 12 hours.  CrCl 15 to <30 mL/minute/1.73 m2: 250 to 500 mg every 12 hours.  CrCl <15 mL/minute/1.73 m2: 250 to 500 mg every 24 hours (expert opinion). - Continuous EEG monitoring - Neurology will follow along ______________________________________________________________________   Lola Jernigan MD-PhD [facility] [phone] CRITICAL CARE Performed  by: Lola LITTIE Jernigan   Total critical care time: 35 minutes  Critical care time was exclusive of separately billable procedures and treating other patients.  Critical care was necessary to treat or prevent imminent or life-threatening deterioration.  Critical care was time spent personally by me on the following activities: development of treatment plan with patient and/or surrogate as well as nursing, discussions with consultants, evaluation of patient's response to treatment, examination of patient, obtaining history from patient or surrogate, ordering and performing treatments and interventions, ordering and review of laboratory studies, ordering and review of radiographic studies, pulse oximetry and re-evaluation of patient's condition.

## 2023-10-20 NOTE — Inpatient Diabetes Management (Signed)
 Inpatient Diabetes Program Recommendations  AACE/ADA: New Consensus Statement on Inpatient Glycemic Control  Target Ranges:  Prepandial:   less than 140 mg/dL      Peak postprandial:   less than 180 mg/dL (1-2 hours)      Critically ill patients:  140 - 180 mg/dL    Latest Reference Range & Units 10/19/23 23:16 10/20/23 01:53  Glucose-Capillary 70 - 99 mg/dL 727 (H) 730 (H)  (H): Data is abnormally high  Review of Glycemic Control  Diabetes history: DM2 Outpatient Diabetes medications: OmniPod insulin  pump with Novolog  Current orders for Inpatient glycemic control: Novolog  0-6 units Q4H  Inpatient Diabetes Program Recommendations:    Insulin : If CBGs are consistently over 180 mg/dl with Novolog  correction, please consider ordering Semglee  9 units Q24H.  Insulin  Pump: Please be sure that patient's insulin  pump has been removed if not already done.  NOTE: Noted consult for Diabetes Coordinator. Diabetes Coordinator is not on campus over the weekend but available by pager from 8am to 5pm for questions or concerns. Chart reviewed. Patient with hx DM2, uses insulin  pump for DM management admitted following cardiac arrest, respiratory failure, currently on ventilator. Not able to see any Endocrinology notes in chart. Noted patient was inpatient in December 2024 and per diabetes coordinator note on 03/28/23 patient's DM was managed by his PCP Dr. Christyne Hurst. Per note on 03/29/23 by diabetes coordinator, the following was patient's insulin  pump settings:  Basal-1 units/hr (24 units total daily) Insulin  carb ratio: 1 units for every 15 carbs Insulin  sensitivity factor: 1 unit drops him 50 mg/dL Target Glucose: 879 mg/dL (correction starts at 879)  Patient and family was instructed to decrease basal rate to 0.5 units/hr. Inpatient diabetes team will follow up with patient when appropriate.  Thanks, Earnie Gainer, RN, MSN, CDCES Diabetes Coordinator Inpatient Diabetes Program [phone] (Team  Pager from 8am to 5pm)

## 2023-10-20 NOTE — Progress Notes (Addendum)
 EEG complete - results pending.  Tech notified Neuro Hospitalist of study events.  Per Neuro Hospitalist prepare study for  LTM.

## 2023-10-20 NOTE — Procedures (Signed)
 Patient Name: Nathan Tyler  MRN: [id number]  Epilepsy Attending: Arlin MALVA Krebs  Referring Physician/Provider: Adolph Tinnie BRAVO, PA-C  Date: 10/20/2023 Duration: 28.46 mins  Patient history: 68yo M s/p cardiac arrest. EEG to evaluate for seizure.  Level of alertness: comatose  AEDs during EEG study: LEV  Technical aspects: This EEG study was done with scalp electrodes positioned according to the 10-20 International system of electrode placement. Electrical activity was reviewed with band pass filter of 1-70Hz , sensitivity of 7 uV/mm, display speed of 68mm/sec with a 60Hz  notched filter applied as appropriate. EEG data were recorded continuously and digitally stored.  Video monitoring was available and reviewed as appropriate.  Description: Patient was noted to have episodes of brief sudden whole body jerking on 10/20/2023 at 0434 and 0455.  Concomitant EEG showed generalized high amplitude polymorphic sharp waves consistent with myoclonic seizures. Duration of seizures was about 15-20 seconds. In between seizures EEG showed generalized background suppression. Hyperventilation and photic stimulation were not performed.     ABNORMALITY - Myoclonic seizure, generalized - Background suppression, generalized  IMPRESSION: This study showed myoclonic seizures on 10/20/2023 at 0434 and 0455, lasting about 15-20 seconds. Additionally there was  profound diffuse encephalopathy. In the setting of cardiac arrest, this EEG pattern is suggestive of anoxic/hypoxic brain injury.   Aaryav Hopfensperger O Alex Leahy

## 2023-10-20 NOTE — Plan of Care (Signed)
 Same-day follow-up note Versed  and propofol  both had to be shut down due to excessive bradycardia. Overnight EEG showed myoclonic seizures on 10/20/2023 at 4:34 AM and 4:55 AM lasting about 15 to 20 seconds.  Additionally there was profound diffuse encephalopathy.  In the setting of cardiac arrest, EEG pattern suggestive of anoxic/hypoxic brain injury. On examination, pupils 2 mm, very sluggishly reactive, absent corneals, on very deep suctioning, had cough with some extensor posturing.  No response to noxious stimulation. Overall prognosis remains poor although formal prognostication should be reserved until 72 hours but given his overall picture of very prolonged downtime with cardiac arrest, and presentation postcardiac arrest with myoclonus usually portends extremely poor prognosis for neurological meaningful recovery For now continue with Keppra and consider restarting propofol  if pressures tolerate if concern for electrographic or clinical seizures.  This was discussed in detail with the wife and the PCCM team.  Neurology will follow -- Nathan Lav, MD Neurologist [facility]

## 2023-10-20 NOTE — Progress Notes (Signed)
 eLink Physician-Brief Progress Note Patient Name: Nathan Tyler DOB: Jun 11, 1955 MRN: [id number]   Date of Service  10/20/2023  HPI/Events of Note  68 year old male with ESRD status post kidney transplant 2025 but had an out-of-hospital cardiac arrest.  Currently on insulin  therapy with glargine 5 units twice daily, sliding scale  Concerned that the sugars are trending downwards    eICU Interventions  Patient's sugars are well-controlled with the expected trend on his insulin  regimen.  No changes are indicated at this time     Intervention Category Intermediate Interventions: Hyperglycemia - evaluation and treatment  Doha Boling 10/20/2023, 10:27 PM

## 2023-10-20 NOTE — Progress Notes (Signed)
 PHARMACY - PHYSICIAN COMMUNICATION CRITICAL VALUE ALERT - BLOOD CULTURE IDENTIFICATION (BCID)  Nathan Tyler is an 68 y.o. male who presented to [facility] on 10/19/2023 after cardiac arrest.   Assessment:  Blood culture positive for klebsiella pneumoniae, no resistance detected. Concern for aspiration pneumonia due to cardiac arrest, patient was started on Unasyn. Discussed with MD, will de-escalate antibiotics to ceftriaxone .    Name of physician (or Provider) Contacted: Dr. Harold  Current antibiotics: Unasyn  Changes to prescribed antibiotics recommended:  Recommendations accepted by provider - Start ceftriaxone  2 gm IV every 24 hours  Results for orders placed or performed during the hospital encounter of 10/19/23  Blood Culture ID Panel (Reflexed) (Collected: 10/19/2023  9:24 PM)  Result Value Ref Range   Enterococcus faecalis NOT DETECTED NOT DETECTED   Enterococcus Faecium NOT DETECTED NOT DETECTED   Listeria monocytogenes NOT DETECTED NOT DETECTED   Staphylococcus species NOT DETECTED NOT DETECTED   Staphylococcus aureus (BCID) NOT DETECTED NOT DETECTED   Staphylococcus epidermidis NOT DETECTED NOT DETECTED   Staphylococcus lugdunensis NOT DETECTED NOT DETECTED   Streptococcus species NOT DETECTED NOT DETECTED   Streptococcus agalactiae NOT DETECTED NOT DETECTED   Streptococcus pneumoniae NOT DETECTED NOT DETECTED   Streptococcus pyogenes NOT DETECTED NOT DETECTED   A.calcoaceticus-baumannii NOT DETECTED NOT DETECTED   Bacteroides fragilis NOT DETECTED NOT DETECTED   Enterobacterales DETECTED (A) NOT DETECTED   Enterobacter cloacae complex NOT DETECTED NOT DETECTED   Escherichia coli NOT DETECTED NOT DETECTED   Klebsiella aerogenes NOT DETECTED NOT DETECTED   Klebsiella oxytoca NOT DETECTED NOT DETECTED   Klebsiella pneumoniae DETECTED (A) NOT DETECTED   Proteus species NOT DETECTED NOT DETECTED   Salmonella species NOT DETECTED NOT DETECTED   Serratia marcescens NOT  DETECTED NOT DETECTED   Haemophilus influenzae NOT DETECTED NOT DETECTED   Neisseria meningitidis NOT DETECTED NOT DETECTED   Pseudomonas aeruginosa NOT DETECTED NOT DETECTED   Stenotrophomonas maltophilia NOT DETECTED NOT DETECTED   Candida albicans NOT DETECTED NOT DETECTED   Candida auris NOT DETECTED NOT DETECTED   Candida glabrata NOT DETECTED NOT DETECTED   Candida krusei NOT DETECTED NOT DETECTED   Candida parapsilosis NOT DETECTED NOT DETECTED   Candida tropicalis NOT DETECTED NOT DETECTED   Cryptococcus neoformans/gattii NOT DETECTED NOT DETECTED   CTX-M ESBL NOT DETECTED NOT DETECTED   Carbapenem resistance IMP NOT DETECTED NOT DETECTED   Carbapenem resistance KPC NOT DETECTED NOT DETECTED   Carbapenem resistance NDM NOT DETECTED NOT DETECTED   Carbapenem resist OXA 48 LIKE NOT DETECTED NOT DETECTED   Carbapenem resistance VIM NOT DETECTED NOT DETECTED   Morna Breach, PharmD PGY2 Cardiology Pharmacy Resident 10/20/2023 3:08 PM

## 2023-10-21 ENCOUNTER — Inpatient Hospital Stay (HOSPITAL_COMMUNITY)

## 2023-10-21 ENCOUNTER — Other Ambulatory Visit (HOSPITAL_COMMUNITY)

## 2023-10-21 DIAGNOSIS — I469 Cardiac arrest, cause unspecified: Secondary | ICD-10-CM

## 2023-10-21 DIAGNOSIS — J9601 Acute respiratory failure with hypoxia: Secondary | ICD-10-CM | POA: Diagnosis not present

## 2023-10-21 DIAGNOSIS — G931 Anoxic brain damage, not elsewhere classified: Secondary | ICD-10-CM | POA: Diagnosis not present

## 2023-10-21 DIAGNOSIS — Z940 Kidney transplant status: Secondary | ICD-10-CM | POA: Diagnosis not present

## 2023-10-21 DIAGNOSIS — J690 Pneumonitis due to inhalation of food and vomit: Secondary | ICD-10-CM | POA: Diagnosis not present

## 2023-10-21 DIAGNOSIS — R569 Unspecified convulsions: Secondary | ICD-10-CM | POA: Diagnosis not present

## 2023-10-21 LAB — BASIC METABOLIC PANEL WITH GFR
Anion gap: 12 (ref 5–15)
BUN: 32 mg/dL — ABNORMAL HIGH (ref 8–23)
CO2: 21 mmol/L — ABNORMAL LOW (ref 22–32)
Calcium: 7.3 mg/dL — ABNORMAL LOW (ref 8.9–10.3)
Chloride: 106 mmol/L (ref 98–111)
Creatinine, Ser: 2.66 mg/dL — ABNORMAL HIGH (ref 0.61–1.24)
GFR, Estimated: 25 mL/min — ABNORMAL LOW (ref >60)
Glucose, Bld: 118 mg/dL — ABNORMAL HIGH (ref 70–99)
Potassium: 3 mmol/L — ABNORMAL LOW (ref 3.5–5.1)
Sodium: 139 mmol/L (ref 135–145)

## 2023-10-21 LAB — CBC
HCT: 22.5 % — ABNORMAL LOW (ref 39.0–52.0)
Hemoglobin: 7 g/dL — ABNORMAL LOW (ref 13.0–17.0)
MCH: 29 pg (ref 26.0–34.0)
MCHC: 31.1 g/dL (ref 30.0–36.0)
MCV: 93.4 fL (ref 80.0–100.0)
Platelets: 436 10*3/uL — ABNORMAL HIGH (ref 150–400)
RBC: 2.41 MIL/uL — ABNORMAL LOW (ref 4.22–5.81)
RDW: 16.3 % — ABNORMAL HIGH (ref 11.5–15.5)
WBC: 3.6 10*3/uL — ABNORMAL LOW (ref 4.0–10.5)
nRBC: 0 % (ref 0.0–0.2)

## 2023-10-21 LAB — GLUCOSE, CAPILLARY
Glucose-Capillary: 244 mg/dL — ABNORMAL HIGH (ref 70–99)
Glucose-Capillary: 245 mg/dL — ABNORMAL HIGH (ref 70–99)
Glucose-Capillary: 310 mg/dL — ABNORMAL HIGH (ref 70–99)
Glucose-Capillary: 125 mg/dL — ABNORMAL HIGH (ref 70–99)
Glucose-Capillary: 124 mg/dL — ABNORMAL HIGH (ref 70–99)
Glucose-Capillary: 136 mg/dL — ABNORMAL HIGH (ref 70–99)

## 2023-10-21 LAB — MAGNESIUM: Magnesium: 1.7 mg/dL (ref 1.7–2.4)

## 2023-10-21 LAB — PATHOLOGIST SMEAR REVIEW

## 2023-10-21 MED ORDER — LORAZEPAM 2 MG/ML IJ SOLN
2.0000 mg | INTRAMUSCULAR | Status: DC | PRN
  Administered 2023-10-21: 2 mg via INTRAVENOUS
  Filled 2023-10-21: qty 1

## 2023-10-21 MED ORDER — FAMOTIDINE 20 MG PO TABS
20.0000 mg | ORAL_TABLET | Freq: Every day | ORAL | Status: DC
  Administered 2023-10-21: 20 mg
  Filled 2023-10-21: qty 1

## 2023-10-21 MED ORDER — POTASSIUM CHLORIDE 20 MEQ PO PACK
40.0000 meq | PACK | ORAL | Status: AC
  Administered 2023-10-21 (×2): 40 meq
  Filled 2023-10-21 (×2): qty 2

## 2023-10-21 MED ORDER — VALPROATE SODIUM 100 MG/ML IV SOLN
1800.0000 mg | Freq: Once | INTRAVENOUS | Status: AC
  Administered 2023-10-21: 1800 mg via INTRAVENOUS
  Filled 2023-10-21: qty 18

## 2023-10-21 MED ORDER — MAGNESIUM SULFATE 2 GM/50ML IV SOLN
2.0000 g | Freq: Once | INTRAVENOUS | Status: AC
  Administered 2023-10-21: 2 g via INTRAVENOUS
  Filled 2023-10-21: qty 50

## 2023-10-21 MED ORDER — HEPARIN SODIUM (PORCINE) 5000 UNIT/ML IJ SOLN
5000.0000 [IU] | Freq: Three times a day (TID) | INTRAMUSCULAR | Status: DC
  Administered 2023-10-21: 5000 [IU] via SUBCUTANEOUS
  Filled 2023-10-21: qty 1

## 2023-10-21 NOTE — Progress Notes (Signed)
 Depakote 20 mg/kg x 1 dose ordered (given he does have some liver injury did not use full 40 mg/kg status load) for worsening myoclonic activity on EEG  While awaiting depakote from pharmacy nursing asked to give Ativan 2 mg

## 2023-10-21 NOTE — Procedures (Signed)
 Patient Name: Nathan Tyler  MRN: [id number]  Epilepsy Attending: Arlin MALVA Krebs  Referring Physician/Provider: Jerrie Lola CROME, MD  Duration: 10/20/2023 9398 to 10/21/2023 9398   Patient history: 68yo M s/p cardiac arrest. EEG to evaluate for seizure.   Level of alertness: comatose   AEDs during EEG study: LEV, VPA   Technical aspects: This EEG study was done with scalp electrodes positioned according to the 10-20 International system of electrode placement. Electrical activity was reviewed with band pass filter of 1-70Hz , sensitivity of 7 uV/mm, display speed of 41mm/sec with a 60Hz  notched filter applied as appropriate. EEG data were recorded continuously and digitally stored.  Video monitoring was available and reviewed as appropriate.   Description: EEG showed burst suppression pattern with intermittent bursts of generalized polymorphic sharply contoured high amplitude 3-5 theta-delta slowing lasting 3 to 10 seconds alternating with 1 to 5 minutes of generalized EEG suppression.  At the beginning of the study, during epileptiform bursts patient was noted to have brief whole body jerking consistent with myoclonic seizure.  Gradually his medications were adjusted, myoclonic jerking abated.  However EEG continued to show epileptiform bursts.  Hyperventilation and photic stimulation were not performed.      ABNORMALITY - Myoclonic seizure, generalized - Burst suppression with epileptiform bursts, generalized   IMPRESSION: At the beginning of the study, patient was noted to have brief whole body jerking consistent consistent with myoclonic seizures every 1-5 minutes. Gradually as medications were adjusted, clinical seizures abated. However EEG continued to show evidence of epileptogenicity with generalized onset and increased risk of seizure recurrence as well as profound diffuse encephalopathy. In the setting of cardiac arrest, this EEG pattern is suggestive of anoxic/hypoxic brain injury.      Jhayden Demuro O Samiksha Pellicano

## 2023-10-21 NOTE — Plan of Care (Signed)
  Problem: Cardiac: Goal: Ability to achieve and maintain adequate cardiopulmonary perfusion will improve Outcome: Progressing   Problem: Neurologic: Goal: Promote progressive neurologic recovery Outcome: Progressing   Problem: Skin Integrity: Goal: Risk for impaired skin integrity will be minimized. Outcome: Progressing   Problem: Clinical Measurements: Goal: Ability to maintain clinical measurements within normal limits will improve Outcome: Progressing Goal: Will remain free from infection Outcome: Progressing Goal: Respiratory complications will improve Outcome: Progressing Goal: Cardiovascular complication will be avoided Outcome: Progressing

## 2023-10-21 NOTE — Progress Notes (Signed)
   10/21/23 1035  Spiritual Encounters  Type of Visit Initial  Care provided to: Family  Referral source Chaplain assessment  Reason for visit Routine spiritual support  OnCall Visit No  Spiritual Framework  Presenting Themes Values and beliefs  Community/Connection Family  Family Stress Factors Major life changes;Health changes;Loss  Interventions  Spiritual Care Interventions Made Compassionate presence;Normalization of emotions;Prayer  Intervention Outcomes  Outcomes Awareness of support;Awareness of health;Reduced anxiety;Reduced fear;Awareness around self/spiritual resourses   Chaplain encountered daughter who was  visibly distress and crying. Chaplain gently approached and asked if she was okay. The daughter replied NO.    She then invited chaplain into the Pt's room, the Pt's daughter shared that her father was transitioning to end-of life. Chaplain offered a word of prayer providing emotional and spiritual support to her during this difficult time.

## 2023-10-21 NOTE — Progress Notes (Signed)
 LTM maint complete - no skin breakdown under:  P8,Fp1

## 2023-10-21 NOTE — Progress Notes (Signed)
 NEUROLOGY CONSULT FOLLOW UP NOTE   Date of service: October 21, 2023 Patient Name: Nathan Tyler MRN:  [id number] DOB:  1956/04/10  Interval Hx/subjective   No significant change, he was given a dose of Depakote overnight due to increased bursts on EEG, but no clinical myoclonus was seen at the bedside. Vitals   Vitals:   10/21/23 0330 10/21/23 0400 10/21/23 0430 10/21/23 0500  BP: 118/60 (!) 123/57 (!) 121/59 (!) 102/54  Pulse: (!) 58 (!) 57 (!) 57 (!) 56  Resp: 16 18 16 15   Temp: (!) 97 F (36.1 C) (!) 97 F (36.1 C) (!) 97.2 F (36.2 C) (!) 97.2 F (36.2 C)  TempSrc:      SpO2: 99% 99% 99% 99%  Weight:    96.8 kg  Height:         Body mass index is 29.76 kg/m.  Physical Exam   Intubated, under sedation  Neurologic Examination    MS: Does not open eyes or follow commands. CN: Pupils are reactive, corneals are intact, no response to doll's eye maneuver, cough is intact Motor: minimal Extension to noxious stimulation in all four extremities Sensory: As above  Medications  Current Facility-Administered Medications:    0.9 %  sodium chloride  infusion (Manually program via Guardrails IV Fluids), , Intravenous, Once, Adolph Tinnie BRAVO, PA-C, Held at 10/20/23 9441   acetaminophen  (TYLENOL ) tablet 650 mg, 650 mg, Oral, Q4H, 650 mg at 10/20/23 0925 **OR** acetaminophen  (TYLENOL ) 160 MG/5ML solution 650 mg, 650 mg, Per Tube, Q4H, 650 mg at 10/21/23 0515 **OR** acetaminophen  (TYLENOL ) suppository 650 mg, 650 mg, Rectal, Q4H, Autry, Lauren E, PA-C   acetaminophen  (TYLENOL ) tablet 650 mg, 650 mg, Oral, Q4H PRN **OR** acetaminophen  (TYLENOL ) 160 MG/5ML solution 650 mg, 650 mg, Per Tube, Q4H PRN **OR** acetaminophen  (TYLENOL ) suppository 650 mg, 650 mg, Rectal, Q4H PRN, Adolph, Lauren E, PA-C   busPIRone (BUSPAR) tablet 30 mg, 30 mg, Oral, Q8H PRN **OR** busPIRone (BUSPAR) tablet 30 mg, 30 mg, Per Tube, Q8H PRN, Adolph, Lauren E, PA-C   cefTRIAXone  (ROCEPHIN ) 2 g in sodium chloride  0.9 %  100 mL IVPB, 2 g, Intravenous, Q24H, Chand, Valinda, MD, Stopped at 10/20/23 1651   Chlorhexidine  Gluconate Cloth 2 % PADS 6 each, 6 each, Topical, Daily, Autry, Lauren E, PA-C, 6 each at 10/20/23 1800   docusate (COLACE) 50 MG/5ML liquid 100 mg, 100 mg, Per Tube, BID, Adolph, Lauren E, PA-C, 100 mg at 10/20/23 2126   enoxaparin  (LOVENOX ) injection 40 mg, 40 mg, Subcutaneous, Q24H, Chand, Sudham, MD, 40 mg at 10/20/23 1615   famotidine (PEPCID) tablet 20 mg, 20 mg, Per Tube, Daily, Gretel Prentice BIRCH, RPH   fentaNYL  (SUBLIMAZE ) injection 25 mcg, 25 mcg, Intravenous, Q15 min PRN, Patsey Lot, MD   fentaNYL  (SUBLIMAZE ) injection 25-100 mcg, 25-100 mcg, Intravenous, Q30 min PRN, Patsey Lot, MD, 50 mcg at 10/19/23 2231   insulin  aspart (novoLOG ) injection 0-15 Units, 0-15 Units, Subcutaneous, TID WC, Chand, Sudham, MD, 11 Units at 10/20/23 1622   insulin  aspart (novoLOG ) injection 0-5 Units, 0-5 Units, Subcutaneous, QHS, Chand, Sudham, MD   insulin  glargine-yfgn (SEMGLEE ) injection 5 Units, 5 Units, Subcutaneous, BID, Chand, Sudham, MD, 5 Units at 10/20/23 0936   [COMPLETED] levETIRAcetam (KEPPRA) undiluted injection 4,000 mg, 4,000 mg, Intravenous, Once, 4,000 mg at 10/20/23 0530 **FOLLOWED BY** levETIRAcetam (KEPPRA) undiluted injection 1,000 mg, 1,000 mg, Intravenous, Q12H, Bhagat, Srishti L, MD, 1,000 mg at 10/21/23 0514   levothyroxine  (SYNTHROID ) tablet 150 mcg, 150 mcg, Per  Tube, [id number], Harold Scholz, MD, 150 mcg at 10/21/23 0515   LORazepam (ATIVAN) injection 2 mg, 2 mg, Intravenous, Q4H PRN, Bhagat, Srishti L, MD, 2 mg at 10/21/23 0435   magnesium sulfate IVPB 2 g 50 mL, 2 g, Intravenous, Once PRN, Adolph, Lauren E, PA-C   magnesium sulfate IVPB 2 g 50 mL, 2 g, Intravenous, Once, Harold Scholz, MD, Last Rate: 50 mL/hr at 10/21/23 0730, 2 g at 10/21/23 0730   midazolam  (VERSED ) 100 mg/100 mL (1 mg/mL) premix infusion, 10-60 mg/hr, Intravenous, Continuous, Bhagat, Srishti L, MD, Stopped at  10/20/23 0734   midazolam  (VERSED ) bolus via infusion 10 mg, 10 mg, Intravenous, Q10 min PRN, Bhagat, Srishti L, MD   mycophenolate  (CELLCEPT ) oral suspension 200 mg/mL, 750 mg, Per Tube, BID, 750 mg at 10/20/23 2143 **AND** mycophenolate  (CELLCEPT ) oral suspension 200 mg/mL, 500 mg, Per Tube, Daily, Chand, Sudham, MD, 500 mg at 10/20/23 1600   norepinephrine (LEVOPHED) 4mg  in (0.016 mg/mL) premix infusion, 2-10 mcg/min, Intravenous, Titrated, Autry, Lauren E, PA-C   ondansetron  (ZOFRAN ) injection 4 mg, 4 mg, Intravenous, Q6H PRN, Autry, Lauren E, PA-C   Oral care mouth rinse, 15 mL, Mouth Rinse, Q2H, Maree Harder, MD, 15 mL at 10/21/23 0825   Oral care mouth rinse, 15 mL, Mouth Rinse, PRN, Maree Harder, MD   polyethylene glycol (MIRALAX  / GLYCOLAX ) packet 17 g, 17 g, Per Tube, Daily, Adolph, Lauren E, PA-C, 17 g at 10/20/23 0925   potassium chloride (KLOR-CON) packet 40 mEq, 40 mEq, Per Tube, Q4H, Chand, Sudham, MD, 40 mEq at 10/21/23 0730   predniSONE  (DELTASONE ) tablet 5 mg, 5 mg, Per Tube, Q breakfast, Chand, Sudham, MD, 5 mg at 10/20/23 1436   propofol  (DIPRIVAN ) 1000 MG/100ML infusion, 0-50 mcg/kg/min, Intravenous, Continuous, Autry, Lauren E, PA-C, Stopped at 10/20/23 0400   tacrolimus  (PROGRAF ) 1 mg/mL oral suspension 1 mg, 1 mg, Per Tube, BID, Chand, Sudham, MD, 1 mg at 10/20/23 2143  Labs and Diagnostic Imaging   Creatinine 2.6 Magnesium 1.7 Calcium  is 7.3 with albumin of 1.7  Imaging(Personally reviewed): I suspect there is already some blurring of the deep structures on his initial head CT.  This is not definite, but is likely worth repeating at this point.  Assessment   [name] is a 68 y.o. male with anoxic brain injury.  Based on early myoclonus, dismal initial EEG pattern, continued dismal EEG pattern with only occasional bursts and predominantly diffusely attenuated, my suspicion is that he likely has a poor prognosis.  In the setting of someone with his underlying  medical issues, I think the likelihood of him to recover is already very low.  If his CT were to confirm significant anoxic brain injury, then I think we would already have enough information to say that this gentleman did not have a significant chance of recovery.  This is especially true when someone was expressed that he would not want to be kept alive if he was going to have a prolonged unconscious state.  At 72 hours, if he continues to only have extensor posturing even with normal head CT, this would also be enough to say that he is going to have minimal chance of recovery.   Recommendations  CT head Continue EEG Continue Keppra 1 g twice daily Neurology will follow ______________________________________________________________________  This patient is critically ill and at significant risk of neurological worsening, death and care requires constant monitoring of vital signs, hemodynamics,respiratory and cardiac monitoring, neurological assessment, discussion with family, other  specialists and medical decision making of high complexity. I spent 45 minutes of neurocritical care time  in the care of  this patient. This was time spent independent of any time provided by nurse practitioner or PA.  Aisha Seals, MD [facility]   If 7pm- 7am, please page neurology on call as listed in AMION. 10/21/2023  9:45 AM

## 2023-10-21 NOTE — Progress Notes (Signed)
 Pt transported from [facility] 04 to CT and then back to [facility] 04 without complications

## 2023-10-21 NOTE — IPAL (Signed)
  Interdisciplinary Goals of Care Family Meeting   Date carried out: 10/21/2023  Location of the meeting: Bedside  Member's involved: Nurse Practitioner, Bedside Registered Nurse, and Family Member or next of kin  Durable Power of Attorney or acting medical decision maker: Wife    Discussion: We discussed goals of care for Norleen KATHEE Ada .   Clell went for a CT H per neuro today with the thought that if it did reveal an anoxic injury we would have early confirmation of this. This was not revealing for an anoxic injury, which does not at all exclude the possibility of an anoxic injury. We talked about this for a while and family understands.   We talked about where to go from here. Pt son gets in town late this evening. Plan is to transition to comfort care at some point tomorrow. In interim we are de-escalating labs, most non-comfort meds.   If sx arise, we can talk about adding comfort focussed medications, but otherwise plan will be comfort meds prior to compassionate extubation   Code status:   Code Status: Do not attempt resuscitation (DNR) PRE-ARREST INTERVENTIONS DESIRED   Disposition: Continue current acute care  Time spent for the meeting:    Ronnald FORBES Gave, NP 10/21/2023, 2:32 PM

## 2023-10-21 NOTE — Progress Notes (Signed)
 NAME:  Nathan Tyler, MRN:  [id number], DOB:  Oct 08, 1955, LOS: 2 ADMISSION DATE:  10/19/2023, CONSULTATION DATE:  10/19/2023 REFERRING MD: [place], MD, CHIEF COMPLAINT:  Cardiac Arrest  History of Present Illness:  68 y/o male who has a PMH for HTN, Chorea movement disorder, Hypothyroidism, and ESRD 2/2 diabetic nephropathy s/p LDKT (2010, from sister, at [facility], no rejection hx), now on iHD via AVF since 06/2023 who BIBEMS after cardiac arrest.  The patient previously had a kidney transplant for about 13 years placed at [facility]  and this stopped working.  He was placed on transplant list at [facility] 2 years ago and  was on Dialysis x 1 month through right arm fistula when a matched kidney was found and in April he underwent another Kidney transplant at [facility].  He had urinary retention since the transplant and has had a Foley cath since.  He has been working with Urology on this.  He was seen at [facility] yesterday and apparently everything was fine.  His wife went out to walk the dog and says she was out for about 5 min and came back to find him on the floor.  He was pulseless, she called 911 who recommended she start CPR which she did.  She says EMS arrived within 5 min and continued with CPR.  ROSC achieved in about 20 min total and prior to ED arrival.  He was intubated.  He received 3 epinephrines and then was started on Epinephrine  drip which was weaned off in ED. He has been having diarrhea recently.  In ED his K+ was 3.1, Cr 1.60, Albumin 1.7, LA 5.2, Hgb 6.5.  Patient vented in ED and having jerking movements. Pertinent  Medical History  HTN, hypothyroidism, and ESRD 2/2 diabetic nephropathy s/p LDKT (2010, from sister[facility], no rejection hx), now on iHD via AVF since 06/2023   Significant Hospital Events: Including procedures, antibiotic start and stop dates in addition to other pertinent events   6/28: Admit to ICU 6/30 DNR. Family discussing withdrawal logistics    Interim History /  Subjective:  EEG nearly flat  K 3 mag 1.7  Cr 2.66 Objective    Blood pressure (!) 112/57, pulse (!) 55, temperature (!) 96.4 F (35.8 C), resp. rate 17, height 5' 11 (1.803 m), weight 96.8 kg, SpO2 100%.    Vent Mode: PRVC FiO2 (%):  [40 %] 40 % Set Rate:  [15 bmp] 15 bmp Vt Set:  [600 mL] 600 mL PEEP:  [5 cmH20] 5 cmH20 Plateau Pressure:  [14 cmH20-15 cmH20] 14 cmH20   Intake/Output Summary (Last 24 hours) at 10/21/2023 1036 Last data filed at 10/21/2023 1000 Gross per 24 hour  Intake 889.71 ml  Output 990 ml  Net -100.29 ml   Filed Weights   10/19/23 2324 10/20/23 0500 10/21/23 0500  Weight: 93.5 kg 93.5 kg 96.8 kg    Examination: General: Critically ill, chronically ill M intubated NAD  HEENT: ETT secure, anicteric sclera  Neuro: Does not follow command. Triggers vent Pulm: mechanically ventilated  Heart: s1s2  Abdomen: soft ndnt  Extremities: no obvious acute deformity  Skin: scattered bruising    Resolved problem list  Lactic acidosis  Assessment and Plan   DNR status GOC   OOH PEA arrest Myoclonus Acute resp failure w hypoxia Aspiration PNA Traumatic rib fx  CKD 3a s/p KTXP x2 Immunocomp host Renal mass Hypokalemia Hypomagnesemia HFpEF  Demand ischemia Anemia oc chronic dz Elevated LFTs, shock liver  DM2 w hyperglycemia  P -discussed code status, GOC  -- decision for DNR and family is going to call out of town family to say goodbye.  -likely transition to comfort care Nov 15, 2023  -in interim, family is deciding if they would like to cont labs, imaging etc or if they would prefer to start focusing on comfort (comfort meds, dc labs etc) but cont vent until fam arrives   In interim: -MV support, VAP, pulm hygiene  -rocephin   -MAP > 65 -eeg, CT H, echo -keppra  -neuro following  -tacro pred and cellcept  -follow CMP CBC PRN -SSI -replace lytes PRN  Best Practice (right click and Reselect all SmartList Selections daily)   Diet/type: NPO DVT  prophylaxis subcu Lovenox  Pressure ulcer(s): N/A GI prophylaxis: H2B Lines: N/A Foley:  Yes, and it is still needed Code Status:  DNR   Labs   CBC: Recent Labs  Lab 10/19/23 2122 10/19/23 2124 10/19/23 2353 10/20/23 0212 10/21/23 0317  WBC  --  4.4 5.1  --  3.6*  NEUTROABS  --  2.7  --   --   --   HGB 6.5* 6.5* 7.7* 7.5* 7.0*  HCT 19.0* 22.4* 24.5* 22.0* 22.5*  MCV  --  101.4* 97.2  --  93.4  PLT  --  498* 514*  --  436*    Basic Metabolic Panel: Recent Labs  Lab 10/19/23 2122 10/19/23 2124 10/19/23 2353 10/20/23 0212 10/20/23 1415 10/21/23 0317  NA 141 142  --  141 140 139  K 3.1* 3.2*  --  2.9* 3.5 3.0*  CL 103 106  --   --  107 106  CO2  --  22  --   --  23 21*  GLUCOSE 278* 288*  --   --  231* 118*  BUN 20 19  --   --  29* 32*  CREATININE 1.60* 1.68* 1.59*  --  2.19* 2.66*  CALCIUM   --  7.3*  --   --  7.4* 7.3*  MG  --   --  1.9  --   --  1.7  PHOS  --   --  4.0  --   --   --    GFR: Estimated Creatinine Clearance: 30.7 mL/min (A) (by C-G formula based on SCr of 2.66 mg/dL (H)). Recent Labs  Lab 10/19/23 2124 10/19/23 2126 10/19/23 2353 10/20/23 0207 10/21/23 0317  WBC 4.4  --  5.1  --  3.6*  LATICACIDVEN  --  5.2*  --  0.9  --     Liver Function Tests: Recent Labs  Lab 10/19/23 2124 10/19/23 2353  AST 141*  --   ALT 83*  --   ALKPHOS 89  --   BILITOT <0.2 0.8  PROT 4.1*  --   ALBUMIN 1.7*  --    No results for input(s): LIPASE, AMYLASE in the last 168 hours. No results for input(s): AMMONIA in the last 168 hours.  ABG    Component Value Date/Time   PHART 7.384 10/20/2023 0212   PCO2ART 40.9 10/20/2023 0212   PO2ART 154 (H) 10/20/2023 0212   HCO3 24.8 10/20/2023 0212   TCO2 26 10/20/2023 0212   ACIDBASEDEF 1.0 10/20/2023 0212   O2SAT 99 10/20/2023 0212     Coagulation Profile: No results for input(s): INR, PROTIME in the last 168 hours.  Cardiac Enzymes: No results for input(s): CKTOTAL, CKMB, CKMBINDEX,  TROPONINI in the last 168 hours.  HbA1C: Hgb A1c MFr Bld  Date/Time Value  Ref Range Status  10/20/2023 03:24 AM 8.2 (H) 4.8 - 5.6 % Final    Comment:    (NOTE) Diagnosis of Diabetes The following HbA1c ranges recommended by the American Diabetes Association (ADA) may be used as an aid in the diagnosis of diabetes mellitus.  Hemoglobin             Suggested A1C NGSP%              Diagnosis  <5.7                   Non Diabetic  5.7-6.4                Pre-Diabetic  >6.4                   Diabetic  <7.0                   Glycemic control for                       adults with diabetes.    02/22/2022 06:23 PM 5.7 (H) 4.8 - 5.6 % Final    Comment:    (NOTE) Pre diabetes:          5.7%-6.4%  Diabetes:              >6.4%  Glycemic control for   <7.0% adults with diabetes     CBG: Recent Labs  Lab 10/20/23 1554 10/20/23 2130 10/20/23 2153 10/21/23 0108 10/21/23 0621  GLUCAP 310* 108* 94 125* 124*    CRITICAL CARE Performed by: Ronnald FORBES Gave   Total critical care time: 42 minutes  Critical care time was exclusive of separately billable procedures and treating other patients. Critical care was necessary to treat or prevent imminent or life-threatening deterioration.  Critical care was time spent personally by me on the following activities: development of treatment plan with patient and/or surrogate as well as nursing, discussions with consultants, evaluation of patient's response to treatment, examination of patient, obtaining history from patient or surrogate, ordering and performing treatments and interventions, ordering and review of laboratory studies, ordering and review of radiographic studies, pulse oximetry and re-evaluation of patient's condition.  Ronnald Gave MSN, AGACNP-BC [place] [facility] Amion for pager 10/21/2023, 10:36 AM

## 2023-10-21 NOTE — TOC Initial Note (Signed)
 Transition of Care [facility]) - Initial/Assessment Note    Patient Details  Name: Nathan Tyler MRN: [id number] Date of Birth: 29-Aug-1955  [facility]) CM/SW Contact:    Justina Delcia Czar, RN Phone Number: [phone] 10/21/2023, 11:46 AM  Clinical Narrative:                 Reviewed chart, and patient transitioning to comfort care.  TOC CM will be available to family as needed. Updated Unit RN to contact if family has questions or concerns.     Barriers to Discharge: Continued Medical Work up   Patient Goals and CMS Choice            Expected Discharge Plan and Services                                              Prior Living Arrangements/Services                       Activities of Daily Living      Permission Sought/Granted                  Emotional Assessment   Attitude/Demeanor/Rapport: Intubated (Following Commands or Not Following Commands)          Admission diagnosis:  Cardiac arrest [facility]) [I46.9] Renal transplant recipient [Z94.0] Patient Active Problem List   Diagnosis Date Noted   Cardiac arrest (HCC) 10/19/2023   Hypoglycemia 03/28/2023   Anemia 02/22/2022   DM2 (diabetes mellitus, type 2) (HCC) 02/22/2022   Chronic diastolic CHF (congestive heart failure) (HCC) 02/22/2022   Overweight (BMI 25.0-29.9) 06/03/2019   Hypothyroidism    Essential hypertension 05/31/2019   CKD (chronic kidney disease) 05/31/2019   Renal transplant, status post 05/31/2019   HLD (hyperlipidemia) 05/31/2019   Toxic/metabolic induced choreiform disorder 05/31/2019   History of colonic polyps 10/25/2017   HCV infection 10/25/2017   Elevated LFTs 03/12/2017   PCP:  Shona Norleen PEDLAR, MD Pharmacy:   [facility] [phone] - [place], [place] - [address] AT [facility] [address] [place] [address] Phone: [phone] Fax: [phone]  [facility] - [place], [place] - [address] [place] [address] Phone: [phone] Fax: [phone]     Social Drivers of Health (SDOH) Social History: SDOH Screenings   Food Insecurity: No Food Insecurity (10/21/2023)  Housing: Unknown (10/21/2023)  Transportation Needs: No Transportation Needs (10/21/2023)  Utilities: Not At Risk (10/21/2023)  Financial Resource Strain: Low Risk  (09/20/2023)   Received from [facility]  Social Connections: Socially Integrated (10/21/2023)  Tobacco Use: Low Risk  (10/14/2023)   Received from [facility]   SDOH Interventions:     Readmission Risk Interventions    03/23/2022    2:53 PM 03/14/2022    9:17 AM 02/28/2022   10:24 AM  Readmission Risk Prevention Plan  Transportation Screening Complete Complete Complete  PCP or Specialist Appt within 5-7 Days   Complete  Home Care Screening   Complete  Medication Review (RN CM)   Complete  Medication Review (RN Care Manager) Complete Complete   PCP or Specialist appointment within 3-5 days of discharge Complete    HRI or Home Care Consult Complete Complete   SW Recovery Care/Counseling Consult Complete Complete  Palliative Care Screening Not Applicable Not Applicable   Skilled Nursing Facility Complete Not Applicable

## 2023-10-21 NOTE — Procedures (Signed)
 Patient Name: Nathan Tyler  MRN: [id number]  Epilepsy Attending: Arlin MALVA Krebs  Referring Physician/Provider: Jerrie Lola CROME, MD  Duration: 10/21/2023 0601 to 15-Nov-2023 0601   Patient history: 68yo M s/p cardiac arrest. EEG to evaluate for seizure.   Level of alertness: comatose   AEDs during EEG study: LEV, VPA   Technical aspects: This EEG study was done with scalp electrodes positioned according to the 10-20 International system of electrode placement. Electrical activity was reviewed with band pass filter of 1-70Hz , sensitivity of 7 uV/mm, display speed of 62mm/sec with a 60Hz  notched filter applied as appropriate. EEG data were recorded continuously and digitally stored.  Video monitoring was available and reviewed as appropriate.   Description: EEG showed burst suppression pattern with intermittent bursts of generalized polymorphic sharply contoured high amplitude 3-5 theta-delta slowing lasting 3 to 5 seconds alternating with 15-30 seconds of generalized EEG suppression.  Gradually eeg worsened and showed near continuous generalized background attenuation.  Hyperventilation and photic stimulation were not performed.      ABNORMALITY - Burst suppression, generalized   IMPRESSION: This study is suggestive of profound diffuse encephalopathy. No definite seizures were noted.    Denielle Bayard O Cathleen Yagi

## 2023-10-22 ENCOUNTER — Inpatient Hospital Stay (HOSPITAL_COMMUNITY)

## 2023-10-22 ENCOUNTER — Encounter (HOSPITAL_COMMUNITY)

## 2023-10-22 DIAGNOSIS — J690 Pneumonitis due to inhalation of food and vomit: Secondary | ICD-10-CM | POA: Diagnosis not present

## 2023-10-22 DIAGNOSIS — G931 Anoxic brain damage, not elsewhere classified: Secondary | ICD-10-CM | POA: Diagnosis not present

## 2023-10-22 DIAGNOSIS — I469 Cardiac arrest, cause unspecified: Secondary | ICD-10-CM | POA: Diagnosis not present

## 2023-10-22 DIAGNOSIS — Z940 Kidney transplant status: Secondary | ICD-10-CM | POA: Diagnosis not present

## 2023-10-22 DIAGNOSIS — J9601 Acute respiratory failure with hypoxia: Secondary | ICD-10-CM | POA: Diagnosis not present

## 2023-10-22 DIAGNOSIS — R569 Unspecified convulsions: Secondary | ICD-10-CM | POA: Diagnosis not present

## 2023-10-22 LAB — CULTURE, BLOOD (ROUTINE X 2)
Special Requests: ADEQUATE
Special Requests: ADEQUATE

## 2023-10-22 LAB — HAPTOGLOBIN: Haptoglobin: 346 mg/dL (ref 32–363)

## 2023-10-22 LAB — TACROLIMUS LEVEL: Tacrolimus (FK506) - LabCorp: 5.3 ng/mL (ref 5.0–20.0)

## 2023-10-22 MED ORDER — GLYCOPYRROLATE 0.2 MG/ML IJ SOLN
0.2000 mg | INTRAMUSCULAR | Status: DC | PRN
  Administered 2023-10-22: 0.2 mg via INTRAVENOUS
  Filled 2023-10-22: qty 1

## 2023-10-22 MED ORDER — GLYCOPYRROLATE 0.2 MG/ML IJ SOLN: 0.2000 mg | INTRAMUSCULAR | Status: DC | PRN

## 2023-10-22 MED ORDER — GLYCOPYRROLATE 1 MG PO TABS
1.0000 mg | ORAL_TABLET | ORAL | Status: DC | PRN
  Filled 2023-10-22: qty 1

## 2023-10-22 MED ORDER — MIDAZOLAM HCL 2 MG/2ML IJ SOLN
2.0000 mg | INTRAMUSCULAR | Status: DC | PRN
  Administered 2023-10-22: 4 mg via INTRAVENOUS
  Filled 2023-10-22: qty 4

## 2023-10-22 MED ORDER — MORPHINE 100MG IN NS 100ML (1MG/ML) PREMIX INFUSION
0.0000 mg/h | INTRAVENOUS | Status: DC
  Administered 2023-10-22: 5 mg/h via INTRAVENOUS
  Filled 2023-10-22: qty 100

## 2023-10-22 MED ORDER — ACETAMINOPHEN 325 MG PO TABS: 650.0000 mg | ORAL_TABLET | Freq: Four times a day (QID) | ORAL | Status: DC | PRN

## 2023-10-22 MED ORDER — ACETAMINOPHEN 650 MG RE SUPP: 650.0000 mg | Freq: Four times a day (QID) | RECTAL | Status: DC | PRN

## 2023-10-22 MED ORDER — POLYVINYL ALCOHOL 1.4 % OP SOLN: 1.0000 [drp] | Freq: Four times a day (QID) | OPHTHALMIC | Status: DC | PRN

## 2023-10-22 MED ORDER — MORPHINE BOLUS VIA INFUSION
5.0000 mg | INTRAVENOUS | Status: DC | PRN
  Administered 2023-10-22: 5 mg via INTRAVENOUS

## 2023-10-22 DEATH — deceased

## 2023-10-24 LAB — TYPE AND SCREEN
ABO/RH(D): O POS
Antibody Screen: NEGATIVE
Unit division: 0
Unit division: 0

## 2023-10-24 LAB — BPAM RBC
Blood Product Expiration Date: 202507012359
Blood Product Expiration Date: 202507252359
ISSUE DATE / TIME: 202506290251
ISSUE DATE / TIME: 202507020843
Unit Type and Rh: 5100
Unit Type and Rh: 9500

## 2023-11-19 NOTE — Unmapped (Signed)
 The [facility] has made a second and final attempt to reach this patient to refill the following medication:AUSTEDO  6 mg Tab (deutetrabenazine ).      We have left voicemails on the following phone numbers: [phone] , have sent a text message to the following phone numbers: [phone] , and have sent a Mychart questionnaire..    Dates contacted: 11/07/23 and   11/19/23   Last scheduled delivery: 10/15/23     The patient may be at risk of non-compliance with this medication. The patient should call the [facility] at [phone]  Option 4, then Option 3: Allergy, Immunology, Pulmonary, Neurology to refill medication.    Tawni Daring   [facility]

## 2023-11-22 NOTE — Death Summary Note (Addendum)
 DEATH SUMMARY   Patient Details  Name: Nathan Tyler MRN: [id number] DOB: 07-20-1955  Admission/Discharge Information   Admit Date:  November 06, 2023  Date of Death: Date of Death: 11-09-2023  Time of Death: Time of Death: 11/15/06  Length of Stay: 3  Referring Physician: Shona Norleen PEDLAR, MD   Reason(s) for Hospitalization  Status post out-of-hospital PEA cardiac arrest Postarrest status myoclonus Anoxic brain injury and encephalopathy Acute respiratory failure with hypoxia Bilateral lower lobe aspiration pneumonia AKI on CKD stage IIIa status post renal transplant x 2, on immunosuppressive therapy Renal mass of atrophic kidney Chronic HFpEF Demand cardiac ischemia Hypokalemia/hypomagnesemia Multiple bilateral rib fracture post CPR Shock liver Anemia of chronic disease Hypothyroidism Poorly controlled diabetes with hyperglycemia DNR status  Diagnoses  Preliminary cause of death: Withdrawal of care in the setting of anoxic brain injury postcardiac arrest Secondary Diagnoses (including complications and co-morbidities):  Postarrest status myoclonus Acute anoxic brain injury and encephalopathy Acute respiratory failure with hypoxia Bilateral lower lobe pneumonia Acute kidney injury on CKD stage IIIa due to ischemic ATN from cardiac arrest  Hypokalemia/hypomagnesemia Multiple rib fractures status post CPR Anemia of chronic disease Shock liver DTM2 Hypothyroidism Chronic HFpEF  Brief Hospital Course (including significant findings, care, treatment, and services provided and events leading to death)  Nathan Tyler is a 68 y.o. year old male who has a PMH for HTN, Chorea movement disorder, Hypothyroidism, and ESRD 2/2 diabetic nephropathy s/p LDKT 11/14/2008, from sister, at [facility], no rejection hx), now on iHD via AVF since 06/2023 who BIBEMS after cardiac arrest.  The patient previously had a kidney transplant for about 13 years placed at [facility] [place]  and this stopped working.  He was  placed on transplant list at [facility] 2 years ago and  was on Dialysis x 1 month through right arm fistula when a matched kidney was found and in April he underwent another Kidney transplant at [facility].  He had urinary retention since the transplant and has had a Foley cath since.  He has been working with Urology on this.  He was seen at [facility] on 6/27 and was stable.  On 11-06-2023 his wife went out to walk the dog and says she was out for about 5 min and came back to find him on the floor.  He was pulseless, she called 911 who recommended she start CPR which she did.  She says EMS arrived within 5 min and continued with CPR.  ROSC achieved in about 20 min total and prior to ED arrival.  He was intubated.  He received 3 epinephrines and then was started on Epinephrine  drip which was weaned off in ED. Of note he has been having diarrhea recently. Pertinent ED labs: K+ was 3.1, Cr 1.60, Albumin 1.7, LA 5.2, Hgb 6.5. Transfused 1 unit and electrolytes repleted. Placed on rocephin  for possible aspiration pna. Patient placed on mechanical ventilation. Noted to be having jerking movements. Given Keppra. EEG confirming myoconlus. Neurology consulted. On 6/30 patient still not following commands and minimal response to painful stimuli. CT head concerning for anoxic brain injury. GOC conversation with wife wanting patient to be DNR and waiting on family to visit then will transition to comfort care. On 11/09/2023 patient made comfort care. TOD 1108 am.     Pertinent Labs and Studies  Significant Diagnostic Studies CT HEAD WO CONTRAST ( ) Addendum Date: 11/09/2023 ADDENDUM REPORT: Nov 09, 2023 09:00 ADDENDUM: Study discussed by telephone with Dr. Aisha Seals on 11/09/2023 at 0800 hours today. He advises  this patient has a poor neurologic exam. Compared to prior Head CT 03/28/2023: - decreased gray-white differentiation at the bilateral deep gray nuclei and deep white matter capsules (series 3, image 17). - decreased bilateral  cerebellar hemisphere gray-white differentiation and cerebellar folia. - perhaps subtle loss of gray-white differentiation also in the more superior hemisphere perirolandic areas also (series 3, image 21). This constellation of clinical and imaging findings is highly suspicious for anoxic brain injury. Electronically Signed   By: VEAR Hurst M.D.   On: 2023/10/26 09:00   Result Date: Oct 26, 2023 CLINICAL DATA:  Head trauma, minor (Age >= 65y); Neck trauma (Age >= 65y) EXAM: CT HEAD WITHOUT CONTRAST CT CERVICAL SPINE WITHOUT CONTRAST TECHNIQUE: Multidetector CT imaging of the head and cervical spine was performed following the standard protocol without intravenous contrast. Multiplanar CT image reconstructions of the cervical spine were also generated. RADIATION DOSE REDUCTION: This exam was performed according to the departmental dose-optimization program which includes automated exposure control, adjustment of the mA and/or kV according to patient size and/or use of iterative reconstruction technique. COMPARISON:  CT head and CT cervical spine 12/21/2021. FINDINGS: CT HEAD FINDINGS Brain: No evidence of acute infarction, hemorrhage, hydrocephalus, extra-axial collection or mass lesion/mass effect. Similar patchy white matter hypodensities, compatible with chronic microvascular ischemic disease. Vascular: No hyperdense vessel. Skull: No acute fracture. Sinuses/Orbits: Mostly clear sinuses.  No acute orbital findings. Other: No mastoid effusions. CT CERVICAL SPINE FINDINGS Alignment: No substantial sagittal subluxation. Skull base and vertebrae: Mild superior T2 endplate deformity, new since 12/21/2021 and compatible with otherwise age indeterminate superior endplate fracture. Soft tissues and spinal canal: No prevertebral fluid or swelling. No visible canal hematoma. Disc levels: Similar multilevel degenerative change including moderate lower cervical degenerative disc disease and multilevel facet and uncovertebral  hypertrophy with varying degrees of neural foraminal stenosis. Upper chest: Visualized lung apices are clear. IMPRESSION: 1. Mild superior T2 endplate deformity, new since 12/21/2021 and compatible with otherwise age indeterminate superior endplate fracture. Recommend correlation with the presence or absence of point tenderness. MRI of the thoracic spine could further evaluate chronicity if clinically warranted. 2. No evidence of acute intracranial abnormality. Electronically Signed: By: Gilmore GORMAN Molt M.D. On: 10/19/2023 22:13   CT Cervical Spine Wo Contrast Addendum Date: October 26, 2023 ADDENDUM REPORT: 10/26/2023 09:00 ADDENDUM: Study discussed by telephone with Dr. Aisha Seals on 26-Oct-2023 at 0800 hours today. He advises this patient has a poor neurologic exam. Compared to prior Head CT 03/28/2023: - decreased gray-white differentiation at the bilateral deep gray nuclei and deep white matter capsules (series 3, image 17). - decreased bilateral cerebellar hemisphere gray-white differentiation and cerebellar folia. - perhaps subtle loss of gray-white differentiation also in the more superior hemisphere perirolandic areas also (series 3, image 21). This constellation of clinical and imaging findings is highly suspicious for anoxic brain injury. Electronically Signed   By: VEAR Hurst M.D.   On: 2023-10-26 09:00   Result Date: 26-Oct-2023 CLINICAL DATA:  Head trauma, minor (Age >= 65y); Neck trauma (Age >= 65y) EXAM: CT HEAD WITHOUT CONTRAST CT CERVICAL SPINE WITHOUT CONTRAST TECHNIQUE: Multidetector CT imaging of the head and cervical spine was performed following the standard protocol without intravenous contrast. Multiplanar CT image reconstructions of the cervical spine were also generated. RADIATION DOSE REDUCTION: This exam was performed according to the departmental dose-optimization program which includes automated exposure control, adjustment of the mA and/or kV according to patient size and/or use of  iterative reconstruction technique. COMPARISON:  CT head and CT  cervical spine 12/21/2021. FINDINGS: CT HEAD FINDINGS Brain: No evidence of acute infarction, hemorrhage, hydrocephalus, extra-axial collection or mass lesion/mass effect. Similar patchy white matter hypodensities, compatible with chronic microvascular ischemic disease. Vascular: No hyperdense vessel. Skull: No acute fracture. Sinuses/Orbits: Mostly clear sinuses.  No acute orbital findings. Other: No mastoid effusions. CT CERVICAL SPINE FINDINGS Alignment: No substantial sagittal subluxation. Skull base and vertebrae: Mild superior T2 endplate deformity, new since 12/21/2021 and compatible with otherwise age indeterminate superior endplate fracture. Soft tissues and spinal canal: No prevertebral fluid or swelling. No visible canal hematoma. Disc levels: Similar multilevel degenerative change including moderate lower cervical degenerative disc disease and multilevel facet and uncovertebral hypertrophy with varying degrees of neural foraminal stenosis. Upper chest: Visualized lung apices are clear. IMPRESSION: 1. Mild superior T2 endplate deformity, new since 12/21/2021 and compatible with otherwise age indeterminate superior endplate fracture. Recommend correlation with the presence or absence of point tenderness. MRI of the thoracic spine could further evaluate chronicity if clinically warranted. 2. No evidence of acute intracranial abnormality. Electronically Signed: By: Gilmore GORMAN Molt M.D. On: 10/19/2023 22:13   CT HEAD WO CONTRAST ( ) Result Date: 10/21/2023 CLINICAL DATA:  Anoxic brain damage. EXAM: CT HEAD WITHOUT CONTRAST TECHNIQUE: Contiguous axial images were obtained from the base of the skull through the vertex without intravenous contrast. RADIATION DOSE REDUCTION: This exam was performed according to the departmental dose-optimization program which includes automated exposure control, adjustment of the mA and/or kV according to  patient size and/or use of iterative reconstruction technique. COMPARISON:  CT head 10/19/2023. FINDINGS: Brain: No acute intracranial hemorrhage. Hypoattenuation in the periventricular and subcortical white matter suggestive of chronic microvascular ischemic changes. There is no significant cerebral edema, mass effect, or midline shift. Basilar cisterns are patent. Posterior fossa is unremarkable. No extra-axial fluid collection. Ventricles: The ventricles are normal. Vascular: Atherosclerotic calcifications of the carotid siphons and intracranial vertebral arteries. No hyperdense vessel. Skull: No acute or aggressive finding. Orbits: Bilateral lens replacement. Sinuses: The visualized paranasal sinuses are clear. Other: Trace fluid in the left mastoid air cells. Multiple EEG leads overlying the scalp. Partially visualized endotracheal tube. IMPRESSION: No CT evidence of acute intracranial abnormality. Electronically Signed   By: Donnice Mania M.D.   On: 10/21/2023 11:22   Overnight EEG with video Result Date: 10/21/2023 Shelton Arlin KIDD, MD     10/21/2023 10:03 AM Patient Name: Nathan Tyler MRN: [id number] Epilepsy Attending: Arlin KIDD Shelton Referring Physician/Provider: Jerrie Lola CROME, MD Duration: 10/20/2023 9398 to 10/21/2023 9398  Patient history: 68yo M s/p cardiac arrest. EEG to evaluate for seizure.  Level of alertness: comatose  AEDs during EEG study: LEV, VPA  Technical aspects: This EEG study was done with scalp electrodes positioned according to the 10-20 International system of electrode placement. Electrical activity was reviewed with band pass filter of 1-70Hz , sensitivity of 7 uV/mm, display speed of 17mm/sec with a 60Hz  notched filter applied as appropriate. EEG data were recorded continuously and digitally stored.  Video monitoring was available and reviewed as appropriate.  Description: EEG showed burst suppression pattern with intermittent bursts of generalized polymorphic sharply contoured  high amplitude 3-5 theta-delta slowing lasting 3 to 10 seconds alternating with 1 to 5 minutes of generalized EEG suppression.  At the beginning of the study, during epileptiform bursts patient was noted to have brief whole body jerking consistent with myoclonic seizure.  Gradually his medications were adjusted, myoclonic jerking abated.  However EEG continued to show epileptiform bursts.  Hyperventilation and photic  stimulation were not performed.    ABNORMALITY - Myoclonic seizure, generalized - Burst suppression with epileptiform bursts, generalized  IMPRESSION: At the beginning of the study, patient was noted to have brief whole body jerking consistent consistent with myoclonic seizures every 1-5 minutes. Gradually as medications were adjusted, clinical seizures abated. However EEG continued to show evidence of epileptogenicity with generalized onset and increased risk of seizure recurrence as well as profound diffuse encephalopathy. In the setting of cardiac arrest, this EEG pattern is suggestive of anoxic/hypoxic brain injury.   Arlin MALVA Krebs   EEG adult Result Date: 10/20/2023 Krebs Arlin MALVA, MD     10/20/2023  6:53 AM Patient Name: Nathan Tyler MRN: [id number] Epilepsy Attending: Arlin MALVA Krebs Referring Physician/Provider: Adolph Tinnie BRAVO, PA-C Date: 10/20/2023 Duration: 28.46 mins Patient history: 68yo M s/p cardiac arrest. EEG to evaluate for seizure. Level of alertness: comatose AEDs during EEG study: LEV Technical aspects: This EEG study was done with scalp electrodes positioned according to the 10-20 International system of electrode placement. Electrical activity was reviewed with band pass filter of 1-70Hz , sensitivity of 7 uV/mm, display speed of 80mm/sec with a 60Hz  notched filter applied as appropriate. EEG data were recorded continuously and digitally stored.  Video monitoring was available and reviewed as appropriate. Description: Patient was noted to have episodes of brief sudden whole  body jerking on 10/20/2023 at 0434 and 0455.  Concomitant EEG showed generalized high amplitude polymorphic sharp waves consistent with myoclonic seizures. Duration of seizures was about 15-20 seconds. In between seizures EEG showed generalized background suppression. Hyperventilation and photic stimulation were not performed.   ABNORMALITY - Myoclonic seizure, generalized - Background suppression, generalized IMPRESSION: This study showed myoclonic seizures on 10/20/2023 at 0434 and 0455, lasting about 15-20 seconds. Additionally there was profound diffuse encephalopathy. In the setting of cardiac arrest, this EEG pattern is suggestive of anoxic/hypoxic brain injury. Arlin MALVA Krebs   DG Abd 1 View Result Date: 10/20/2023 CLINICAL DATA:  Confirm orogastric tube placement EXAM: ABDOMEN - 1 VIEW COMPARISON:  Radiograph 10/19/2023 FINDINGS: The enteric tube has been advanced with tip and side-port in the stomach. IMPRESSION: Enteric tube with tip and side-port in the stomach. Electronically Signed   By: Norman Gatlin M.D.   On: 10/20/2023 03:00   DG CHEST PORT 1 VIEW Result Date: 10/20/2023 CLINICAL DATA:  Feeding tube placement EXAM: PORTABLE CHEST 1 VIEW COMPARISON:  Same day radiograph and CT FINDINGS: Endotracheal tube tip in the intrathoracic trachea 5.5 cm from the carina. Interval placement of a enteric tube with tip likely in the proximal stomach and side port in the mid to distal esophagus. Recommend advancement approximately 10 cm to ensure side port is within the stomach. Stable cardiomediastinal silhouette. Aortic atherosclerotic calcification. Diffuse interstitial coarsening. Retrocardiac airspace opacities. Small left pleural effusion. No pneumothorax. Displaced right fifth rib fracture. IMPRESSION: 1. Enteric tube tip likely in the proximal stomach and side port in the mid to distal esophagus. Recommend advancement approximately 10 cm to ensure side port is within the stomach. 2. Similar  interstitial and airspace opacities consistent with edema or infection. Small left pleural effusion. 3. Displaced right fifth rib fracture. Electronically Signed   By: Norman Gatlin M.D.   On: 10/20/2023 00:08   CT CHEST ABDOMEN PELVIS W CONTRAST Result Date: 10/19/2023 CLINICAL DATA:  Status post CPR for cardiac arrest with CPR related chest trauma. Additional history states blunt abdominal trauma. EXAM: CT CHEST, ABDOMEN, AND PELVIS WITH CONTRAST TECHNIQUE: Multidetector CT  imaging of the chest, abdomen and pelvis was performed following the standard protocol during bolus administration of intravenous contrast. RADIATION DOSE REDUCTION: This exam was performed according to the departmental dose-optimization program which includes automated exposure control, adjustment of the mA and/or kV according to patient size and/or use of iterative reconstruction technique. CONTRAST:  75mL OMNIPAQUE  IOHEXOL  350 MG/ML SOLN COMPARISON:  Portable chest today, portable chest 09/18/2023 and 05/17/2023, chest CT without contrast 02/22/2022, and CTA chest 06/03/2019. FINDINGS: CT CHEST FINDINGS Cardiovascular: There is cardiomegaly with a left chamber predominance, slight distention in the superior pulmonary veins. No pericardial effusion. Coronary arteries are heavily calcified. Prominent pulmonary trunk measures 3.2 cm, previously 3.7 cm in keeping with arterial hypertension. There is no central arterial embolus. There is aortic tortuosity, scattered calcifications and thickening in the aortic valve leaflets, and atherosclerosis of the aorta and great vessels without aneurysm, stenosis or dissection. Mediastinum/Nodes: ETT in place with tip 3.3 cm from the carina. The cuff distends the trachea greater than the diameter of the distal trachea and may be overinflated. Clinical correlation suggested. The distal trachea and main bronchi are clear. There is no axillary or intrathoracic adenopathy. Mildly patulous esophagus with  normal wall thickness. Mediastinal lipomatosis. Lungs/Pleura: There are bilateral symmetric minimal layering pleural effusions, smaller than previously. No pneumothorax. Again noted are patchy postinflammatory ground-glass and coarse linear scarring changes of the lungs. There is patchy dense consolidation in the lower lobes which could be due to atelectasis, pneumonia, aspiration or edema. Respiratory motion does limit fine detail. There is posterior atelectasis both upper lobes. The central airways are small caliber which could be due to active respiration or bronchospasm. No other focal abnormality is seen through the breathing motion. Musculoskeletal: There are acute mildly displaced fractures of the anterolateral right third, fourth, fifth 6 ribs, more displaced fracture involving the anterior right seventh rib and slightly displaced anterior right eighth rib fracture. On the left, there are additional mildly displaced fractures of the anterior second rib, and the anterior fourth through seventh ribs. There is no displaced sternal fracture. There are degenerative changes and bridging enthesopathy lower half of the thoracic spine. Several old healed ribcage fracture deformities. CT ABDOMEN PELVIS FINDINGS Hepatobiliary: 19 cm length mildly steatotic liver without focal abnormality or acute injury. No biliary dilatation. There is mild dilatation of the gallbladder, slight wall thickening and faint pericholecystic edema. In this case this is probably congestive rather than due to cholecystitis but clinical correlation is advised. No calcified stones. Pancreas: Partially atrophic, otherwise unremarkable. Spleen: No acute injury or mass.  No splenomegaly. Adrenals/Urinary Tract: The kidneys are below the plane of the prior studies. Both exhibit advanced atrophy consistent with end-stage renal disease. There are extensive bilateral renovascular calcifications. There is no adrenal mass. No left renal mass. On the  right, there is a Bosniak 1 cyst in the superior pole measuring 2.3 cm and 18 Hounsfield units. In the anterior mid to lower pole, there is a heterogeneous partially solid mass measuring 2.2 cm and 43 Hounsfield units on 5:73. MRI is recommended preferably without and with contrast for further evaluation of this. A renal cell carcinoma is suspected or at the very least is not excluded. There are bilateral transplanted kidneys in the iliac fossae. The most recent transplant renal ultrasound 03/12/2022 only describes a right renal transplant. The left iliac fossa transplant apparently occurred sometime after that. Both transplant kidneys demonstrate symmetric cortical enhancement. There are occasional tiny Bosniak 2 cortical cysts in both transplant kidneys  which are too small to characterize. No intrarenal stones are seen in the transplant kidneys. There is no hydronephrosis on the right. Delayed images do not show visible contrast in either collecting system. On the left, there is mild hydroureteronephrosis. Just above where the transplant ureter inserts into the bladder there is a 3 mm density in the ureter best seen on 5:111 and on 8:54. This could be a small noncalcified stone, sloughed papilla, or small urothelial neoplasm. There is scattered fluid and edema around the renal sinus and pelvis associated with this and trace posterior transplant left perinephric fluid. This could be on an inflammatory or obstructive uropathy basis or the the patient could have developed a caliceal leak. The bladder is catheterized, contracted and not well seen. There are perivesical stranding changes concerning for cystitis. Stomach/Bowel: No dilatation or wall thickening. An appendix is not seen. There is diffuse colonic diverticulosis without evidence of acute diverticulitis. Vascular/Lymphatic: Aortic heavy branch vessel atherosclerosis. No enlarged abdominal or pelvic lymph nodes. Reproductive: Enlarged prostate 5.1 cm  transverse axis. Other: Retroperitoneal lipomatosis. Minimal abdominal and pelvic ascites. No free air, free hemorrhage or incarcerated hernias. Mild body wall anasarca. Musculoskeletal: Degenerative change lumbar spine. No acute regional osseous findings. IMPRESSION: 1. Acute mildly displaced fractures of the anterolateral right third through eighth ribs, and the anterior left second and fourth through seventh ribs. No pneumothorax. 2. Cardiomegaly with slight distention of the superior pulmonary veins and prominent pulmonary trunk the latter in keeping with arterial hypertension. 3. Minimal layering pleural effusions, smaller than previously. 4. Patchy dense consolidation in the lower lobes which could be due to atelectasis, pneumonia, aspiration or edema. 5. Small caliber central airways which could be due to active respiration or bronchospasm. 6. 2.2 cm heterogeneous partially solid mass in the anterior mid to lower pole of the atrophic native right kidney. MRI is recommended preferably without and with contrast for further evaluation. A renal cell carcinoma is suspected or at the very least is not excluded. 7. Bilateral transplanted kidneys in the iliac fossae. The most recent transplant renal ultrasound 03/12/2022 only describes a right renal transplant. The left iliac fossa transplant apparently occurred sometime after that. 8. Mild left transplant hydroureteronephrosis with a 3 mm density in the distal left transplant ureter just above where the ureter inserts into the bladder. This could be a small noncalcified stone, sloughed papilla, or small urothelial neoplasm. Further evaluation recommended. 9. Scattered fluid and edema around the transplant left renal sinus and pelvis associated with this and trace posterior transplant left perinephric fluid. This could be on an inflammatory/infectious or obstructive uropathy basis or the patient could have developed a caliceal leak. 10. Catheterized contracted  bladder with perivesical stranding changes concerning for cystitis. 11. Minimal abdominal and pelvic ascites. 12. Diverticulosis without evidence of acute diverticulitis. 13. Prostatomegaly. 14. Aortic and coronary artery atherosclerosis. Aortic Atherosclerosis (ICD10-I70.0). Electronically Signed   By: Francis Quam M.D.   On: 10/19/2023 22:55   DG Chest Portable 1 View Result Date: 10/19/2023 CLINICAL DATA:  Post CPR for cardiac arrest EXAM: PORTABLE CHEST 1 VIEW COMPARISON:  Radiograph 09/18/2023 FINDINGS: Stable cardiomegaly. Diffuse interstitial coarsening and patchy airspace opacities. No pleural effusion or pneumothorax. Acute displaced right fifth rib fracture. Defibrillator pads. Endotracheal tube tip in the intrathoracic trachea 5.5 cm from the carina. IMPRESSION: 1. Endotracheal tube tip in the intrathoracic trachea 5.5 cm from the carina. 2. Diffuse interstitial coarsening and patchy airspace opacities may be due to edema or infection. 3. Acute displaced  right fifth rib fracture. Electronically Signed   By: Norman Gatlin M.D.   On: 10/19/2023 21:44    Microbiology Recent Results (from the past 240 hours)  Culture, blood (routine x 2)     Status: Abnormal   Collection Time: 10/19/23  9:24 PM   Specimen: BLOOD LEFT HAND  Result Value Ref Range Status   Specimen Description BLOOD LEFT HAND  Final   Special Requests   Final    BOTTLES DRAWN AEROBIC AND ANAEROBIC Blood Culture adequate volume   Culture  Setup Time   Final    GRAM NEGATIVE RODS IN BOTH AEROBIC AND ANAEROBIC BOTTLES CRITICAL VALUE NOTED.  VALUE IS CONSISTENT WITH PREVIOUSLY REPORTED AND CALLED VALUE.    Culture (A)  Final    KLEBSIELLA PNEUMONIAE CRITICAL RESULT CALLED TO, READ BACK BY AND VERIFIED WITH: PHARMD E SINCLAIRE [id number] AT 959 AM BY CM SUSCEPTIBILITIES PERFORMED ON PREVIOUS CULTURE WITHIN THE LAST 5 DAYS. Performed at [facility], [address] [place], [place] [address]    Report Status  10-26-2023 FINAL  Final  Culture, blood (routine x 2)     Status: Abnormal   Collection Time: 10/19/23  9:24 PM   Specimen: BLOOD LEFT HAND  Result Value Ref Range Status   Specimen Description BLOOD LEFT HAND  Final   Special Requests   Final    BOTTLES DRAWN AEROBIC AND ANAEROBIC Blood Culture adequate volume   Culture  Setup Time   Final    GRAM NEGATIVE RODS ANAEROBIC BOTTLE ONLY CRITICAL RESULT CALLED TO, READ BACK BY AND VERIFIED WITH: PHARMD POWELL BLUSH [id number] BY JINNY COMMON, MT Performed at [facility], [address] [place], [place] [address]    Culture KLEBSIELLA PNEUMONIAE (A)  Final   Report Status 2023-10-26 FINAL  Final   Organism ID, Bacteria KLEBSIELLA PNEUMONIAE  Final   Organism ID, Bacteria KLEBSIELLA PNEUMONIAE  Final      Susceptibility   Klebsiella pneumoniae - KIRBY BAUER*    CEFAZOLIN  SENSITIVE Sensitive    Klebsiella pneumoniae - MIC*    AMPICILLIN >=32 RESISTANT Resistant     CEFEPIME  <=0.12 SENSITIVE Sensitive     CEFTAZIDIME <=1 SENSITIVE Sensitive     CEFTRIAXONE  <=0.25 SENSITIVE Sensitive     CIPROFLOXACIN <=0.25 SENSITIVE Sensitive     GENTAMICIN <=1 SENSITIVE Sensitive     IMIPENEM <=0.25 SENSITIVE Sensitive     TRIMETH/SULFA 80 RESISTANT Resistant     AMPICILLIN/SULBACTAM 8 SENSITIVE Sensitive     PIP/TAZO <=4 SENSITIVE Sensitive ug/mL    * KLEBSIELLA PNEUMONIAE    KLEBSIELLA PNEUMONIAE  Blood Culture ID Panel (Reflexed)     Status: Abnormal   Collection Time: 10/19/23  9:24 PM  Result Value Ref Range Status   Enterococcus faecalis NOT DETECTED NOT DETECTED Final   Enterococcus Faecium NOT DETECTED NOT DETECTED Final   Listeria monocytogenes NOT DETECTED NOT DETECTED Final   Staphylococcus species NOT DETECTED NOT DETECTED Final   Staphylococcus aureus (BCID) NOT DETECTED NOT DETECTED Final   Staphylococcus epidermidis NOT DETECTED NOT DETECTED Final   Staphylococcus lugdunensis NOT DETECTED NOT DETECTED Final    Streptococcus species NOT DETECTED NOT DETECTED Final   Streptococcus agalactiae NOT DETECTED NOT DETECTED Final   Streptococcus pneumoniae NOT DETECTED NOT DETECTED Final   Streptococcus pyogenes NOT DETECTED NOT DETECTED Final   A.calcoaceticus-baumannii NOT DETECTED NOT DETECTED Final   Bacteroides fragilis NOT DETECTED NOT DETECTED Final   Enterobacterales DETECTED (A) NOT DETECTED  Final    Comment: Enterobacterales represent a large order of gram negative bacteria, not a single organism.   Enterobacter cloacae complex NOT DETECTED NOT DETECTED Final   Escherichia coli NOT DETECTED NOT DETECTED Final   Klebsiella aerogenes NOT DETECTED NOT DETECTED Final   Klebsiella oxytoca NOT DETECTED NOT DETECTED Final   Klebsiella pneumoniae DETECTED (A) NOT DETECTED Final    Comment: CRITICAL RESULT CALLED TO, READ BACK BY AND VERIFIED WITH: PHARMD HEATHER WILSON [id number] BY J RAZZAK, MT    Proteus species NOT DETECTED NOT DETECTED Final   Salmonella species NOT DETECTED NOT DETECTED Final   Serratia marcescens NOT DETECTED NOT DETECTED Final   Haemophilus influenzae NOT DETECTED NOT DETECTED Final   Neisseria meningitidis NOT DETECTED NOT DETECTED Final   Pseudomonas aeruginosa NOT DETECTED NOT DETECTED Final   Stenotrophomonas maltophilia NOT DETECTED NOT DETECTED Final   Candida albicans NOT DETECTED NOT DETECTED Final   Candida auris NOT DETECTED NOT DETECTED Final   Candida glabrata NOT DETECTED NOT DETECTED Final   Candida krusei NOT DETECTED NOT DETECTED Final   Candida parapsilosis NOT DETECTED NOT DETECTED Final   Candida tropicalis NOT DETECTED NOT DETECTED Final   Cryptococcus neoformans/gattii NOT DETECTED NOT DETECTED Final   CTX-M ESBL NOT DETECTED NOT DETECTED Final   Carbapenem resistance IMP NOT DETECTED NOT DETECTED Final   Carbapenem resistance KPC NOT DETECTED NOT DETECTED Final   Carbapenem resistance NDM NOT DETECTED NOT DETECTED Final   Carbapenem resist OXA 48  LIKE NOT DETECTED NOT DETECTED Final   Carbapenem resistance VIM NOT DETECTED NOT DETECTED Final    Comment: Performed at [facility], [address], [place], [place] [address]  MRSA Next Gen by PCR, Nasal     Status: None   Collection Time: 10/19/23 11:18 PM   Specimen: Nasal Mucosa; Nasal Swab  Result Value Ref Range Status   MRSA by PCR Next Gen NOT DETECTED NOT DETECTED Final    Comment: (NOTE) The GeneXpert MRSA Assay (FDA approved for NASAL specimens only), is one component of a comprehensive MRSA colonization surveillance program. It is not intended to diagnose MRSA infection nor to guide or monitor treatment for MRSA infections. Test performance is not FDA approved in patients less than 32 years old. Performed at [facility], [address], [place], [place] [address]     Lab Basic Metabolic Panel: Recent Labs  Lab 10/19/23 2122 10/19/23 2124 10/19/23 2353 10/20/23 0212 10/20/23 1415 10/21/23 0317  NA 141 142  --  141 140 139  K 3.1* 3.2*  --  2.9* 3.5 3.0*  CL 103 106  --   --  107 106  CO2  --  22  --   --  23 21*  GLUCOSE 278* 288*  --   --  231* 118*  BUN 20 19  --   --  29* 32*  CREATININE 1.60* 1.68* 1.59*  --  2.19* 2.66*  CALCIUM   --  7.3*  --   --  7.4* 7.3*  MG  --   --  1.9  --   --  1.7  PHOS  --   --  4.0  --   --   --    Liver Function Tests: Recent Labs  Lab 10/19/23 2124 10/19/23 2353  AST 141*  --   ALT 83*  --   ALKPHOS 89  --   BILITOT <0.2 0.8  PROT 4.1*  --  ALBUMIN 1.7*  --    No results for input(s): LIPASE, AMYLASE in the last 168 hours. No results for input(s): AMMONIA in the last 168 hours. CBC: Recent Labs  Lab 10/19/23 2122 10/19/23 2124 10/19/23 2353 10/20/23 0212 10/21/23 0317  WBC  --  4.4 5.1  --  3.6*  NEUTROABS  --  2.7  --   --   --   HGB 6.5* 6.5* 7.7* 7.5* 7.0*  HCT 19.0* 22.4* 24.5* 22.0* 22.5*  MCV  --  101.4* 97.2  --  93.4  PLT  --  498* 514*  --  436*   Cardiac Enzymes: No  results for input(s): CKTOTAL, CKMB, CKMBINDEX, TROPONINI in the last 168 hours. Sepsis Labs: Recent Labs  Lab 10/19/23 2124 10/19/23 2126 10/19/23 2353 10/20/23 0207 10/21/23 0317  WBC 4.4  --  5.1  --  3.6*  LATICACIDVEN  --  5.2*  --  0.9  --     Procedures/Operations  EEG   RIGEL FILSINGER 2023-11-21, 2:04 PM

## 2023-11-22 NOTE — Progress Notes (Signed)
LTM maint complete - no skin breakdown seen Serviced [facility] monitored, Event button test confirmed by [facility].

## 2023-11-22 NOTE — Progress Notes (Signed)
 Patient extubated to comfort per written orders with RT x2 and RN at bedside.

## 2023-11-22 NOTE — Progress Notes (Signed)
 NEUROLOGY CONSULT FOLLOW UP NOTE   Date of service: 11-18-23 Patient Name: Nathan Tyler MRN:  [id number] DOB:  [birth year]  Interval Hx/subjective   No significant change. Vitals   Vitals:   11/18/2023 0603 11-18-23 0700 11/18/2023 0800 November 18, 2023 0900  BP: (!) 140/64 (!) 145/64 (!) 149/66 (!) 149/67  Pulse:  90 90 91  Resp:  20 20 20   Temp:  99.9 F (37.7 C) 99.9 F (37.7 C) 100 F (37.8 C)  TempSrc:      SpO2:  99% 99% 99%  Weight:      Height:         Body mass index is 28.9 kg/m.  Physical Exam   Intubated, under sedation  Neurologic Examination    MS: Does not open eyes or follow commands. CN: Pupils are reactive, corneals are intact, no response to doll's eye maneuver Motor: minimal Extension to noxious stimulation  Sensory: As above  Medications  Current Facility-Administered Medications:    0.9 %  sodium chloride  infusion (Manually program via Guardrails IV Fluids), , Intravenous, Once, Adolph Tinnie BRAVO, PA-C, Held at 10/20/23 9441   acetaminophen  (TYLENOL ) tablet 650 mg, 650 mg, Oral, Q4H PRN **OR** acetaminophen  (TYLENOL ) 160 MG/5ML solution 650 mg, 650 mg, Per Tube, Q4H PRN **OR** acetaminophen  (TYLENOL ) suppository 650 mg, 650 mg, Rectal, Q4H PRN, Adolph, Lauren E, PA-C   artificial tears ophthalmic solution 1 drop, 1 drop, Both Eyes, QID PRN, Emilio, Marios D, PA-C   Chlorhexidine  Gluconate Cloth 2 % PADS 6 each, 6 each, Topical, Daily, Adolph Tinnie BRAVO, PA-C, 6 each at 11/18/2023 0907   famotidine (PEPCID) tablet 20 mg, 20 mg, Per Tube, Daily, Gretel Prentice BIRCH, RPH, 20 mg at 10/21/23 1002   fentaNYL  (SUBLIMAZE ) injection 25 mcg, 25 mcg, Intravenous, Q15 min PRN, Patsey Lot, MD, 25 mcg at 18-Nov-2023 0536   fentaNYL  (SUBLIMAZE ) injection 25-100 mcg, 25-100 mcg, Intravenous, Q30 min PRN, Patsey Lot, MD, 50 mcg at 10/19/23 2231   glycopyrrolate (ROBINUL) tablet 1 mg, 1 mg, Oral, Q4H PRN **OR** glycopyrrolate (ROBINUL) injection 0.2 mg, 0.2 mg, Subcutaneous,  Q4H PRN **OR** glycopyrrolate (ROBINUL) injection 0.2 mg, 0.2 mg, Intravenous, Q4H PRN, Payne, Joziyah D, PA-C, 0.2 mg at Nov 18, 2023 1028   midazolam  (VERSED ) 100 mg/100 mL (1 mg/mL) premix infusion, 10-60 mg/hr, Intravenous, Continuous, Bhagat, Srishti L, MD, Stopped at 10/20/23 0734   midazolam  (VERSED ) bolus via infusion 10 mg, 10 mg, Intravenous, Q10 min PRN, Bhagat, Srishti L, MD   midazolam  (VERSED ) injection 2-4 mg, 2-4 mg, Intravenous, Q4H PRN, Payne, Kamen D, PA-C, 4 mg at 2023-11-18 1028   morphine 100mg  in NS 100mL (1mg /mL) infusion - premix, 0-20 mg/hr, Intravenous, Continuous, Emilio, Milt D, PA-C, Last Rate: 5 mL/hr at 11/18/23 1018, 5 mg/hr at 11-18-23 1018   morphine bolus via infusion 5 mg, 5 mg, Intravenous, Q5 min PRN, Payne, Lakeith D, PA-C, 5 mg at November 18, 2023 1022   ondansetron  (ZOFRAN ) injection 4 mg, 4 mg, Intravenous, Q6H PRN, Autry, Lauren E, PA-C   Oral care mouth rinse, 15 mL, Mouth Rinse, Q2H, Maree Harder, MD, 15 mL at 11-18-23 0809   Oral care mouth rinse, 15 mL, Mouth Rinse, PRN, Maree Harder, MD  Labs and Diagnostic Imaging   Creatinine 2.6 Magnesium 1.7 Calcium  is 7.3 with albumin of 1.7  Imaging(Personally reviewed): I suspect there is already some blurring of the deep structures on his initial head CT.  This is not definite, but is likely worth repeating at this point.  Assessment   Nathan Tyler is a 68 y.o. male with anoxic brain injury.  Based on early myoclonus, dismal initial EEG pattern, continued dismal EEG pattern with only occasional bursts and predominantly diffusely attenuated, and now a motor response no better than extension on day three, he has a poor prognosis.  The CT was initially read as normal, but on comparison to previous EEG from December, I do think there is loss of distinction of the gray-white junction, especially in the deep gray as well as some edema in the cerebellum.  In any case, based on his exam alone, he has a poor prognosis and has  previously expressed wishes that he would not want to be kept alive in a severely impaired state on machines for prolonged period of time, and I think in a best case scenario this is what would be expected.  Based on that, his family is planning to move to comfort care and I agree with that plan.  Recommendations  Agree with plan to move to comfort care. ______________________________________________________________________  This patient is critically ill and at significant risk of neurological worsening, death and care requires constant monitoring of vital signs, hemodynamics,respiratory and cardiac monitoring, neurological assessment, discussion with family, other specialists and medical decision making of high complexity. I spent 35 minutes of neurocritical care time  in the care of  this patient. This was time spent independent of any time provided by nurse practitioner or PA.  Aisha Seals, MD [facility]   If 7pm- 7am, please page neurology on call as listed in AMION. Nov 14, 2023  10:44 AM

## 2023-11-22 NOTE — Progress Notes (Signed)
   Chaplain responded to on-call page to provide prayer for family who has just transitioned Nathan Tyler to comfort care. Chaplain introduced spiritual care and offered support to the pt's family who surrounded him at the bedside. Nathan Tyler was accompanied by his wife, Nathan Tyler, and her children as well as his son and daughter and his own siblings and spouses. Chaplain offered prayer in the family's tradition at their request and provided brief grief education and support. Chaplain remains available for continued support as needed.  Please page as further needs arise.  Nathan Tyler. Davee Lomax, M.Div. [facility] Chaplain Pager [phone] Office [phone]     11/04/23 1059  Spiritual Encounters  Type of Visit Initial  Care provided to: Pt and family  Reason for visit End-of-life  OnCall Visit Yes  Spiritual Framework  Presenting Themes Other (comment)  Community/Connection Family;Significant other;Friend(s)  Family Stress Factors Loss of control;Health changes  Interventions  Spiritual Care Interventions Made Compassionate presence;Bereavement/grief support;Prayer  Intervention Outcomes  Outcomes Connection to values and goals of care

## 2023-11-22 NOTE — Progress Notes (Signed)
 LTM maint complete - no skin breakdown under:  FP2,F7,F3

## 2023-11-22 NOTE — Progress Notes (Signed)
 NAME:  Nathan Tyler, MRN:  [id number], DOB:  02/06/1956, LOS: 3 ADMISSION DATE:  10/19/2023, CONSULTATION DATE:  10/19/2023 REFERRING MD: [place], MD, CHIEF COMPLAINT:  Cardiac Arrest  History of Present Illness:  68 y/o male who has a PMH for HTN, Chorea movement disorder, Hypothyroidism, and ESRD 2/2 diabetic nephropathy s/p LDKT (2010, from sister, at [facility], no rejection hx), now on iHD via AVF since 06/2023 who BIBEMS after cardiac arrest.  The patient previously had a kidney transplant for about 13 years placed at [facility] of [place]  and this stopped working.  He was placed on transplant list at [facility] 2 years ago and  was on Dialysis x 1 month through right arm fistula when a matched kidney was found and in April he underwent another Kidney transplant at [facility].  He had urinary retention since the transplant and has had a Foley cath since.  He has been working with Urology on this.  He was seen at [facility] yesterday and apparently everything was fine.  His wife went out to walk the dog and says she was out for about 5 min and came back to find him on the floor.  He was pulseless, she called 911 who recommended she start CPR which she did.  She says EMS arrived within 5 min and continued with CPR.  ROSC achieved in about 20 min total and prior to ED arrival.  He was intubated.  He received 3 epinephrines and then was started on Epinephrine  drip which was weaned off in ED. He has been having diarrhea recently.  In ED his K+ was 3.1, Cr 1.60, Albumin 1.7, LA 5.2, Hgb 6.5.  Patient vented in ED and having jerking movements. Pertinent  Medical History  HTN, hypothyroidism, and ESRD 2/2 diabetic nephropathy s/p LDKT (2010, from sister[facility], no rejection hx), now on iHD via AVF since 06/2023   Significant Hospital Events: Including procedures, antibiotic start and stop dates in addition to other pertinent events   6/28: Admit to ICU 6/30 DNR. Family discussing withdrawal logistics    Interim History /  Subjective:  Patient intubated Not following commands not on sedation  Objective    Blood pressure (!) 140/64, pulse 91, temperature 100 F (37.8 C), resp. rate (!) 24, height 5' 11 (1.803 m), weight 94 kg, SpO2 99%.    Vent Mode: PRVC FiO2 (%):  [40 %] 40 % Set Rate:  [15 bmp-20 bmp] 20 bmp Vt Set:  [600 mL] 600 mL PEEP:  [5 cmH20] 5 cmH20 Plateau Pressure:  [14 cmH20-19 cmH20] 17 cmH20   Intake/Output Summary (Last 24 hours) at 2023/11/09 0706 Last data filed at 09-Nov-2023 0536 Gross per 24 hour  Intake 268 ml  Output 920 ml  Net -652 ml   Filed Weights   10/20/23 0500 10/21/23 0500 Nov 09, 2023 0500  Weight: 93.5 kg 96.8 kg 94 kg    Examination: General:  critically ill appearing on mech vent HEENT: MM pink/moist; ETT in place Neuro: not following commands not on sedation; perrl CV: s1s2, no m/r/g PULM:  dim clear BS bilaterally; on mech vent PRVC GI: soft, bsx4 active  Extremities: warm/dry, no edema    Resolved problem list  Lactic acidosis  Assessment and Plan   DNR status GOC  OOH PEA arrest Myoclonus Acute resp failure w hypoxia Aspiration PNA Klebsiella pneumoniae bacteremia Traumatic rib fx  CKD 3a s/p KTXP x2 Immunocomp host Renal mass Hypokalemia Hypomagnesemia HFpEF  Demand ischemia Anemia oc chronic dz Elevated  LFTs, shock liver  DM2 w hyperglycemia  P: -spoke with wife Terri at bedside stating wants to transition to comfort care later today. Waiting on few more family members to come in and visit and will let us  know when ready. In interim wants to hold off unnecessary labs.  -Supportive care -cont MV support; vap prevention -cont rocephin , keppra for now   Best Practice (right click and Reselect all SmartList Selections daily)   Diet/type: NPO DVT prophylaxis subcu Lovenox  Pressure ulcer(s): N/A GI prophylaxis: H2B Lines: N/A Foley:  Yes, and it is still needed Code Status:  DNR; plan to go comfort later today    Total  critical care time: 35 minutes   JD Emilio RIGGERS [facility] 2023/10/25, 8:02 AM  Please see [email or web address] for pager details.  From 7A-7P if no response, please call [phone]. After hours, please call ELink [phone].

## 2023-11-22 NOTE — Procedures (Signed)
 Patient Name: Nathan Tyler  MRN: [id number]  Epilepsy Attending: Arlin MALVA Krebs  Referring Physician/Provider: Jerrie Lola CROME, MD  Duration: 11-17-2023 9398 to 11-17-23 9160   Patient history: 68yo M s/p cardiac arrest. EEG to evaluate for seizure.   Level of alertness: comatose   AEDs during EEG study: LEV, VPA   Technical aspects: This EEG study was done with scalp electrodes positioned according to the 10-20 International system of electrode placement. Electrical activity was reviewed with band pass filter of 1-70Hz , sensitivity of 7 uV/mm, display speed of 48mm/sec with a 60Hz  notched filter applied as appropriate. EEG data were recorded continuously and digitally stored.  Video monitoring was available and reviewed as appropriate.   Description: EEG showed generalized background attenuation, not reactive to stimulation.  Hyperventilation and photic stimulation were not performed.      ABNORMALITY - Background suppression, generalized   IMPRESSION: This study is suggestive of profound diffuse encephalopathy. No definite seizures were noted.    Taijah Macrae O Michal Strzelecki

## 2023-11-22 NOTE — Progress Notes (Signed)
 LTM VIDEO EEG discontinued - no skin breakdown at [name]

## 2023-11-22 DEATH — deceased

## 2023-11-27 NOTE — Unmapped (Unsigned)
 [address] [address]  [facility] [place] [address]  [address]  [place] [address]  [phone]    Date: 11/28/2023  Patient Name: Gary Rogers  MRN: [id number]  PCP: Gary Rogers Gary Norleen Rudolph, MD    Assessment:      Mr. Gary Rogers is a 68 y.o. male who  has a past medical history of Anxiety, Chorea, Chronic kidney disease, Depression, Diabetes mellitus, type 2, Hyperlipidemia, Hypertension, and Hyperthyroidism. presenting in follow up for evaluation of mostly generalized, asymmetric, adventitious movements sparing the upper face (left greater than right), historically beginning in a rather acute/subacute fashion in 2009 when he apparently had very poorly controlled blood sugars and development/appreciation of chronic kidney disease requiring renal transplant, with subsequent improvement of his chorea both subjectively and apparently objectively through follow-up clinic notes from the 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    1. 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 He develops parakinesias during examination, with a nonstereoptyped movement that flows from joint to joint, matching with suspected choreiform disorder. Some of his speech difficulties could be apraxic in nature versus ataxic in nature should he have had any involvement of his brainstem/cerebellum with the underlying toxic/metabolic process, but may just as well be related to lower facial chorea. I might argue the latter case after seeing his lower facial dyskinesias more vividly in the past off of therapy.  Patient historically has benefited from tetrabenazine  with more evident EPS symptoms that were even bothering the patient for which reason we discontinued therapy.  Deutetrabenazine  has been well-tolerated with what appear to be minimal EPS symptoms which are not bothering the patient, and overall feels as though his choreiform movement is well controlled.    Since last visit patient continues to manage chronic health issues and is awaiting approval for potential kidney transplant. His TSH is being managed by primary care.  We will continue current dose of Austedo  unchanged as he feels there is not currently a need to adjust medications as his choreiform movement is well controlled. I have referred the patient to physical therapy to work on balance as currently I am concerned he will and cause injury to himself.    I personally spent 22 minutes face-to-face and non-face-to-face in the care of this patient, which includes all pre, intra, and post visit time on the date of service.  All documented time was specific to the E/M visit and does not include any procedures that may have been performed.    Follow-up 6 months.           Plan:      There are no Patient Instructions on file for this visit.      Subjective:      HPI: Patient is a 68 y.o. left-handed Caucasian male with past medical history of chronic kidney disease status post renal transplant in 2010, diabetes mellitus previously quite poorly controlled, who presents for follow up  of adventitious movements that have been present throughout the body since approximately 2009. These were felt to be toxic-metabolic induced choreaform movements. Please review my initial clinic visit note dated on 09/29.2016 for more information regarding history of present illness.    Interim history: Patient presents today by himself. He is waiting to undergo a kidney transplant.     He thinks his chorea is doing pretty good and is not interfering with quality of life.     He has had a couple of falls, the last being August 31st. He was leaving work and tripped in the parking lot. 3-4 months before he fell down the front steps at home.     Dose of Austedo  has not changed (1 6mg  tablet twice a day).    Denies swallowing concerns, being overly slow, overly stiff.     He notes that diabetes is under control.     Endorses some changes in memory, but noting too worrisome.     Endorses some irritable bowels.     CHOREA/DYSKINESIA REGIMEN:  Austedo  6 mg 1 tablet twice a day    On review of systems, the patient denies hallucinations, impulsivity, irritability, current memory deficits.  Denies double vision. Denies loss of consciousness episodes. Denies urinary urgency or frequency/hesitancy of urination.    Since his last visit on 01/28/2023 with Gary Louis, NP, the patient has maintained Austedo  6 mg BID. He has previously been seen by Dr. Curlene and Gary Louis, NP.    Past Medical Hx:    Past Medical History:   Diagnosis Date    Anxiety     Chorea     Chronic kidney disease     Depression     Diabetes mellitus, type 2       Hyperlipidemia     Hypertension     Hyperthyroidism        Past Surgical Hx:    Past Surgical History:   Procedure Laterality Date    NEPHRECTOMY TRANSPLANTED ORGAN Bilateral 2010       Social Hx:    Social History     Socioeconomic History    Marital status: Widowed   Tobacco Use    Smoking status: Never     Passive exposure: Never    Smokeless tobacco: Never   Substance and Sexual Activity    Alcohol use: Not Currently    Drug use: No    Sexual activity: Not Currently   Social History Narrative    1.  Retired Naval architect    2.  Lives alone, but nearby his mother in [place].    3.  Patient is a widower    4.  Denies any known exposure to toxic substances such as industrial pesticides or radiation.     Social Drivers of Health     Food Insecurity: No Food Insecurity (03/09/2022)    Received from [name]    Hunger Vital Sign     Worried About Running Out of Food in the Last Year: Never true     Ran Out of Food in the Last Year: Never true   Transportation Needs: No Transportation Needs (03/09/2022)    Received from [facility] - Transportation     Lack of Transportation (Medical): No     Lack of Transportation (Non-Medical): No    Received from [facility]    Social Network   Housing: Unknown (08/01/2020)    Housing     Within the past  12 months, have you ever stayed: outside, in a car, in a tent, in an overnight shelter, or temporarily in someone else's home (i.e. couch-surfing)?: No       Family Hx:    Family History   Problem Relation Age of Onset    Thyroid disease Mother     Brain cancer Father     Hearing loss Father     No Known Problems Sister     Diabetes Maternal Grandfather     No Known Problems Sister     Hepatitis Son         Hepatitis C    No Known Problems Daughter        ALLERGIES:  No Known Allergies    CURRENT MEDICATIONS:    Current Outpatient Medications   Medication Sig Dispense Refill    amlodipine  (NORVASC ) 10 MG tablet Take 1 tablet (10 mg total) by mouth daily.      atorvastatin (LIPITOR) 10 MG tablet Take 1 tablet (10 mg total) by mouth in the morning.      CAPTOPRIL ORAL Take 6.25 mg by mouth two (2) times a day.      carvedilol (COREG) 6.25 MG tablet Take 1 tablet (6.25 mg total) by mouth two (2) times a day.      deutetrabenazine  (AUSTEDO ) 6 mg Tab Take 1 tablet (6 mg total) by mouth two (2) times a day. 60 tablet 6    DEXCOM G7 SENSOR Devi Use as directed.      furosemide (LASIX) 80 MG tablet 1 (ONE) TABLET IN THE MORNING, 1/2 TAB (40MG ) IN THE EVENING      glucagon (GVOKE HYPOPEN 1-PACK) 1 mg/0.2 mL AtIn Keep on hand hydrALAZINE (APRESOLINE) 25 MG tablet       insulin degludec 200 unit/mL (3 mL) InPn Inject under the skin daily.       levothyroxine (SYNTHROID) 125 MCG tablet Take 1 tablet (125 mcg total) by mouth daily.      LOKELMA  10 gram PwPk packet 1 (ONE) PACKET BY MOUTH THREE TIMES A WEEK (Patient not taking: Reported on 01/28/2023)      mycophenolate (CELLCEPT) 500 mg tablet Take by mouth two (2) times a day.      mycophenolate (MYFORTIC) 180 MG EC tablet Take 1 tablet (180 mg total) by mouth two (2) times a day.      NOVOLOG U-100 INSULIN ASPART 100 unit/mL vial INJECT UP TO 200 UNITS INTO OMNIPOD INSULIN PUMP EVERY 3 DAYS.      OMNIPOD DASH PODS, GEN 4, Crtg CHANGE POD EVERY 72 HOURS      oxyCODONE (ROXICODONE) 5 MG immediate release tablet Take 1 tablet (5 mg total) by mouth every eight (8) hours as needed. (Patient not taking: Reported on 01/28/2023)      predniSONE (DELTASONE) 5 MG tablet Take 1 tablet (5 mg total) by mouth daily.      sodium bicarbonate 650 mg tablet Take 1 tablet (650 mg total) by mouth two (2) times a day.      tacrolimus (PROGRAF) 0.5 MG capsule Take 3 capsules (1.5 mg total) by mouth two (2) times a day. TAKE 4 CAPSULES BY MOUTH TWICE DAILY       No current facility-administered medications for this visit.        Objective:      Physical Exam:  There were no vitals filed for this visit.         Neurological Examination:   MS: AAOx3, naming/fluency/repetition intact.  Following lateralizing commands across midline     Cranial nerves: PERRL, approx. 4.5 to 3.33mm brisk bilaterally. EOMI except for inability to bury sclera on bilateral, lateral gaze, (abducting eye only (even with monocular testing)). No end gaze horizontal nystagmus appreciated, no interrupting saccades.  No evidence today for hypometric nor hypermetric saccades.  VFF to confrontation. No facial asymmetry. Tongue protrudes midline.  V1-3 intact to light touch and pinprick. SCM and trapezius 5/5 bilaterally.    Motor: symmetric bulk throughout. No fasciculations.     MOVEMENT DISORDERS EXAMINATION:  Facial expression was normal . Volume of speech was normal . Handwriting was not assessed. There was no evidence of rest tremor. There was no evidence of postural tremor. There was mild  action tremor in bil upper extremities. Tone was normal in the neck and tone was normal in the right upper extremity, right lower extremity and left lower extremity, but there was increased tone in the left upper extremity today (influenced by paratonias).  Rapid alternating movements were mildly decreased in amplitude and speed with mild breakdown in the left upper extremity and left lower extremity.   He could get up from a low lying chair without help of both hands..  Gait was steady with good heel to toe stride without alternating length of stride.  Arm swing was normal bilaterally with minimal parakinesias.     In addition, there were evident choreiform movements of the upper extremities, right upper extremity mildly worse than left upper extremity again, not present in the trunk; present in lower limbs only while seated today, not while ambulating (right worse than left). Moderate orobuccolingual movements appreciated without upper facial involvement.  Please see AIMS score below mostly for monitoring purposes.    Coordination: slight overshoot with finger chase.     Testing:      AIMS TARDIVE DYSKINESIA SCALE    Facial and Oral Movements  1. Muscles of facial expression (e.g. movements of forehead, eyebrows, periorbital area, cheeks. Include frowning, blinking, grimacing of upper face)   2 = mild    2. Lips and perioral area (e.g. puckering, pouting, smacking)  3 = moderate    3. Jaw (biting, clenching, chewing, mouth opening, lateral movement)  2 = mild    4. Tongue (rate only increase in movement both in and out of mouth, not inability to sustain movement)  1 = minimal (may be extreme normal)    Extremity Movements   5. Upper (arms, wrists, hands, fingers; include movements that are choreic or athetoid.  Do not include tremor)            2 = mild     6. Lower (legs, knees, ankles, toes; e.g. lateral knee movement, foot tapping, heel dropping, foot squirming, inversion and eversion of foot)            3 = moderate    Trunk Movements   7.  Neck, shoulders, hips (rocking, twisting, squirming, pelvic gyrations.  Include diaphragmatic movements)   2 = mild    Global Judgements  8.  Severity of abnormal movements          2 = mild    Based on the highest single score on the above items  9.  Incapacitation due to abnormal movements  1 = minimal    10.  Patient's awareness of abnormal movements  1 = aware, no distress    11.  Current problems with teeth and/or dentures  0 = no    12.  Does patient usually wear dentures?  0 = no  ______________________________________________________________________  TOTAL: 19

## 2024-02-03 DIAGNOSIS — Z524 Kidney donor: Secondary | ICD-10-CM | POA: Diagnosis not present

## 2024-04-21 NOTE — Progress Notes (Signed)
 Specialty Medication(s): Austedo     Gary Rogers has been dis-enrolled from the [facility] specialty pharmacy services as a result of multiple unsuccessful outreach attempts by the pharmacy.    Additional information provided to the patient: n/a    Gary Rogers, PharmD  [facility] Specialty Pharmacist
